# Patient Record
Sex: Male | Born: 1935 | Race: White | Hispanic: No | Marital: Married | Smoking: Former smoker
Health system: Southern US, Community
[De-identification: ages and names within clinical notes are randomized; demographics above are authoritative.]

## PROBLEM LIST (undated history)

## (undated) DIAGNOSIS — R29898 Other symptoms and signs involving the musculoskeletal system: Secondary | ICD-10-CM

## (undated) DIAGNOSIS — G4730 Sleep apnea, unspecified: Secondary | ICD-10-CM

## (undated) DIAGNOSIS — R011 Cardiac murmur, unspecified: Secondary | ICD-10-CM

## (undated) DIAGNOSIS — H269 Unspecified cataract: Secondary | ICD-10-CM

## (undated) DIAGNOSIS — K449 Diaphragmatic hernia without obstruction or gangrene: Secondary | ICD-10-CM

## (undated) DIAGNOSIS — I10 Essential (primary) hypertension: Secondary | ICD-10-CM

## (undated) DIAGNOSIS — K219 Gastro-esophageal reflux disease without esophagitis: Secondary | ICD-10-CM

## (undated) DIAGNOSIS — Z0389 Encounter for observation for other suspected diseases and conditions ruled out: Secondary | ICD-10-CM

## (undated) DIAGNOSIS — N400 Enlarged prostate without lower urinary tract symptoms: Secondary | ICD-10-CM

## (undated) DIAGNOSIS — E119 Type 2 diabetes mellitus without complications: Secondary | ICD-10-CM

## (undated) DIAGNOSIS — M1990 Unspecified osteoarthritis, unspecified site: Secondary | ICD-10-CM

## (undated) DIAGNOSIS — Z972 Presence of dental prosthetic device (complete) (partial): Secondary | ICD-10-CM

## (undated) DIAGNOSIS — Z9889 Other specified postprocedural states: Secondary | ICD-10-CM

## (undated) DIAGNOSIS — Z87442 Personal history of urinary calculi: Secondary | ICD-10-CM

## (undated) DIAGNOSIS — E7800 Pure hypercholesterolemia, unspecified: Secondary | ICD-10-CM

## (undated) DIAGNOSIS — Z9989 Dependence on other enabling machines and devices: Secondary | ICD-10-CM

## (undated) DIAGNOSIS — K5790 Diverticulosis of intestine, part unspecified, without perforation or abscess without bleeding: Secondary | ICD-10-CM

## (undated) DIAGNOSIS — I38 Endocarditis, valve unspecified: Secondary | ICD-10-CM

## (undated) DIAGNOSIS — I4891 Unspecified atrial fibrillation: Secondary | ICD-10-CM

## (undated) DIAGNOSIS — H9190 Unspecified hearing loss, unspecified ear: Secondary | ICD-10-CM

## (undated) DIAGNOSIS — G4733 Obstructive sleep apnea (adult) (pediatric): Secondary | ICD-10-CM

## (undated) DIAGNOSIS — I639 Cerebral infarction, unspecified: Secondary | ICD-10-CM

## (undated) HISTORY — PX: KNEE ARTHROSCOPY: SHX127

## (undated) HISTORY — DX: Cardiac murmur, unspecified: R01.1

## (undated) HISTORY — DX: Unspecified atrial fibrillation: I48.91

## (undated) HISTORY — PX: UPPER GI ENDOSCOPY: SHX6162

## (undated) HISTORY — DX: Essential (primary) hypertension: I10

## (undated) HISTORY — DX: Benign prostatic hyperplasia without lower urinary tract symptoms: N40.0

## (undated) HISTORY — DX: Other specified postprocedural states: Z98.890

## (undated) HISTORY — DX: Other symptoms and signs involving the musculoskeletal system: R29.898

## (undated) HISTORY — DX: Encounter for observation for other suspected diseases and conditions ruled out: Z03.89

## (undated) HISTORY — PX: COLONOSCOPY: SHX174

## (undated) HISTORY — DX: Sleep apnea, unspecified: G47.30

## (undated) HISTORY — PX: SHOULDER OPEN ROTATOR CUFF REPAIR: SHX2407

## (undated) HISTORY — PX: LAPAROSCOPIC CHOLECYSTECTOMY: SUR755

## (undated) HISTORY — PX: EYE SURGERY: SHX253

## (undated) HISTORY — DX: Diverticulosis of intestine, part unspecified, without perforation or abscess without bleeding: K57.90

## (undated) HISTORY — DX: Diaphragmatic hernia without obstruction or gangrene: K44.9

## (undated) HISTORY — PX: VASECTOMY: SHX75

## (undated) HISTORY — DX: Endocarditis, valve unspecified: I38

## (undated) HISTORY — PX: MULTIPLE TOOTH EXTRACTIONS: SHX2053

## (undated) HISTORY — DX: Cerebral infarction, unspecified: I63.9

## (undated) HISTORY — DX: Gastro-esophageal reflux disease without esophagitis: K21.9

## (undated) HISTORY — PX: MITRAL VALVE REPAIR: SHX2039

## (undated) HISTORY — PX: JOINT REPLACEMENT: SHX530

---

## 1998-02-25 ENCOUNTER — Emergency Department (HOSPITAL_COMMUNITY): Admission: EM | Admit: 1998-02-25 | Discharge: 1998-02-25 | Payer: Self-pay | Admitting: Emergency Medicine

## 1998-02-25 ENCOUNTER — Encounter: Payer: Self-pay | Admitting: Emergency Medicine

## 2001-08-16 ENCOUNTER — Encounter: Payer: Self-pay | Admitting: Cardiology

## 2001-08-16 ENCOUNTER — Ambulatory Visit (HOSPITAL_COMMUNITY): Admission: RE | Admit: 2001-08-16 | Discharge: 2001-08-16 | Payer: Self-pay | Admitting: Cardiology

## 2001-10-20 ENCOUNTER — Ambulatory Visit (HOSPITAL_COMMUNITY): Admission: RE | Admit: 2001-10-20 | Discharge: 2001-10-20 | Payer: Self-pay | Admitting: Cardiology

## 2001-10-20 ENCOUNTER — Encounter: Payer: Self-pay | Admitting: Cardiology

## 2001-10-25 ENCOUNTER — Ambulatory Visit (HOSPITAL_COMMUNITY): Admission: RE | Admit: 2001-10-25 | Discharge: 2001-10-25 | Payer: Self-pay | Admitting: Neurology

## 2001-10-25 ENCOUNTER — Encounter: Payer: Self-pay | Admitting: Neurology

## 2002-06-13 DIAGNOSIS — K449 Diaphragmatic hernia without obstruction or gangrene: Secondary | ICD-10-CM

## 2002-06-13 HISTORY — DX: Diaphragmatic hernia without obstruction or gangrene: K44.9

## 2004-03-18 ENCOUNTER — Ambulatory Visit: Payer: Self-pay | Admitting: Family Medicine

## 2004-04-06 ENCOUNTER — Ambulatory Visit: Payer: Self-pay | Admitting: Family Medicine

## 2004-07-21 ENCOUNTER — Ambulatory Visit: Payer: Self-pay | Admitting: Family Medicine

## 2004-08-12 ENCOUNTER — Emergency Department (HOSPITAL_COMMUNITY): Admission: EM | Admit: 2004-08-12 | Discharge: 2004-08-12 | Payer: Self-pay | Admitting: Emergency Medicine

## 2004-08-12 ENCOUNTER — Ambulatory Visit: Payer: Self-pay | Admitting: Family Medicine

## 2004-08-17 ENCOUNTER — Ambulatory Visit: Payer: Self-pay | Admitting: Family Medicine

## 2004-09-07 ENCOUNTER — Ambulatory Visit: Payer: Self-pay | Admitting: Family Medicine

## 2004-12-08 ENCOUNTER — Ambulatory Visit: Payer: Self-pay | Admitting: Family Medicine

## 2004-12-10 ENCOUNTER — Ambulatory Visit: Payer: Self-pay | Admitting: Family Medicine

## 2005-01-11 ENCOUNTER — Ambulatory Visit: Payer: Self-pay | Admitting: Family Medicine

## 2005-01-27 ENCOUNTER — Ambulatory Visit: Payer: Self-pay | Admitting: Internal Medicine

## 2005-02-16 ENCOUNTER — Ambulatory Visit: Payer: Self-pay | Admitting: Internal Medicine

## 2005-02-16 ENCOUNTER — Ambulatory Visit (HOSPITAL_COMMUNITY): Admission: RE | Admit: 2005-02-16 | Discharge: 2005-02-16 | Payer: Self-pay | Admitting: Internal Medicine

## 2005-04-07 ENCOUNTER — Ambulatory Visit: Payer: Self-pay | Admitting: Family Medicine

## 2005-04-29 ENCOUNTER — Ambulatory Visit: Payer: Self-pay | Admitting: Cardiology

## 2005-08-05 ENCOUNTER — Ambulatory Visit: Payer: Self-pay | Admitting: Family Medicine

## 2005-10-11 ENCOUNTER — Ambulatory Visit: Payer: Self-pay | Admitting: Family Medicine

## 2005-12-31 ENCOUNTER — Ambulatory Visit: Payer: Self-pay | Admitting: Family Medicine

## 2006-01-04 ENCOUNTER — Ambulatory Visit: Payer: Self-pay | Admitting: Family Medicine

## 2006-01-27 ENCOUNTER — Ambulatory Visit: Payer: Self-pay | Admitting: Family Medicine

## 2006-02-03 ENCOUNTER — Ambulatory Visit: Payer: Self-pay | Admitting: Family Medicine

## 2006-03-16 ENCOUNTER — Ambulatory Visit: Payer: Self-pay | Admitting: Internal Medicine

## 2006-05-03 ENCOUNTER — Encounter: Admission: RE | Admit: 2006-05-03 | Discharge: 2006-08-01 | Payer: Self-pay | Admitting: Orthopaedic Surgery

## 2006-06-28 ENCOUNTER — Ambulatory Visit: Payer: Self-pay | Admitting: Cardiology

## 2006-08-02 ENCOUNTER — Encounter: Admission: RE | Admit: 2006-08-02 | Discharge: 2006-09-06 | Payer: Self-pay | Admitting: Orthopaedic Surgery

## 2007-01-16 ENCOUNTER — Ambulatory Visit: Payer: Self-pay | Admitting: Cardiology

## 2007-01-18 ENCOUNTER — Encounter: Payer: Self-pay | Admitting: Pulmonary Disease

## 2007-01-19 ENCOUNTER — Encounter: Payer: Self-pay | Admitting: Pulmonary Disease

## 2007-01-25 ENCOUNTER — Encounter: Payer: Self-pay | Admitting: Pulmonary Disease

## 2007-01-26 ENCOUNTER — Ambulatory Visit: Payer: Self-pay | Admitting: Cardiology

## 2007-02-14 ENCOUNTER — Ambulatory Visit: Payer: Self-pay | Admitting: Cardiology

## 2007-02-14 ENCOUNTER — Inpatient Hospital Stay (HOSPITAL_BASED_OUTPATIENT_CLINIC_OR_DEPARTMENT_OTHER): Admission: RE | Admit: 2007-02-14 | Discharge: 2007-02-14 | Payer: Self-pay | Admitting: Cardiology

## 2007-02-14 HISTORY — PX: CARDIAC CATHETERIZATION: SHX172

## 2007-03-03 ENCOUNTER — Ambulatory Visit: Payer: Self-pay | Admitting: Cardiology

## 2007-04-06 ENCOUNTER — Ambulatory Visit: Payer: Self-pay | Admitting: Pulmonary Disease

## 2007-04-06 DIAGNOSIS — I10 Essential (primary) hypertension: Secondary | ICD-10-CM

## 2007-04-06 DIAGNOSIS — I38 Endocarditis, valve unspecified: Secondary | ICD-10-CM | POA: Insufficient documentation

## 2007-04-06 DIAGNOSIS — R0602 Shortness of breath: Secondary | ICD-10-CM | POA: Insufficient documentation

## 2007-04-06 DIAGNOSIS — I2510 Atherosclerotic heart disease of native coronary artery without angina pectoris: Secondary | ICD-10-CM | POA: Insufficient documentation

## 2007-04-06 DIAGNOSIS — I4891 Unspecified atrial fibrillation: Secondary | ICD-10-CM | POA: Insufficient documentation

## 2007-04-06 DIAGNOSIS — G4730 Sleep apnea, unspecified: Secondary | ICD-10-CM

## 2007-12-07 ENCOUNTER — Ambulatory Visit: Payer: Self-pay | Admitting: Cardiology

## 2008-01-25 ENCOUNTER — Encounter: Payer: Self-pay | Admitting: Cardiology

## 2008-02-21 ENCOUNTER — Encounter: Payer: Self-pay | Admitting: Cardiology

## 2008-04-09 ENCOUNTER — Ambulatory Visit: Payer: Self-pay | Admitting: Cardiology

## 2008-04-16 ENCOUNTER — Ambulatory Visit: Payer: Self-pay | Admitting: Cardiology

## 2008-04-29 ENCOUNTER — Encounter: Payer: Self-pay | Admitting: Cardiology

## 2008-05-06 ENCOUNTER — Encounter: Payer: Self-pay | Admitting: Cardiology

## 2008-05-07 ENCOUNTER — Ambulatory Visit (HOSPITAL_COMMUNITY): Admission: RE | Admit: 2008-05-07 | Discharge: 2008-05-07 | Payer: Self-pay | Admitting: Cardiology

## 2008-05-14 ENCOUNTER — Ambulatory Visit: Payer: Self-pay | Admitting: Cardiology

## 2009-01-10 ENCOUNTER — Encounter: Payer: Self-pay | Admitting: Cardiology

## 2009-01-31 ENCOUNTER — Encounter: Payer: Self-pay | Admitting: Cardiology

## 2009-04-29 ENCOUNTER — Ambulatory Visit: Payer: Self-pay | Admitting: Cardiology

## 2009-05-01 ENCOUNTER — Ambulatory Visit: Payer: Self-pay | Admitting: Cardiology

## 2009-05-06 ENCOUNTER — Encounter: Payer: Self-pay | Admitting: Cardiology

## 2009-05-07 ENCOUNTER — Encounter: Payer: Self-pay | Admitting: Cardiology

## 2009-05-20 ENCOUNTER — Encounter (INDEPENDENT_AMBULATORY_CARE_PROVIDER_SITE_OTHER): Payer: Self-pay | Admitting: *Deleted

## 2009-06-13 ENCOUNTER — Encounter (INDEPENDENT_AMBULATORY_CARE_PROVIDER_SITE_OTHER): Payer: Self-pay | Admitting: *Deleted

## 2009-12-22 ENCOUNTER — Encounter: Payer: Self-pay | Admitting: Cardiology

## 2010-03-19 NOTE — Letter (Signed)
Summary: Colonoscopy Date Change Letter  [facility]  [address] [place], [place] [address]   Phone: [phone]  Fax: [phone]      June 13, 2009 MRN: [id number]   [name] [address] [place], [place]  [address]   Dear [name],   Previously you were recommended to have a repeat colonoscopy around this time. Your chart was recently reviewed by Dr. Judie Petit T. Russella Dar of [facility]. Follow up colonoscopy is now recommended in May 2014. This revised recommendation is based on current, nationally recognized guidelines for colorectal cancer screening and polyp surveillance. These guidelines are endorsed by the American Cancer Society, [employer] on Colorectal Cancer as well as numerous other major medical organizations.  Please understand that our recommendation assumes that you do not have any new symptoms such as bleeding, a change in bowel habits, anemia, or significant abdominal discomfort. If you do have any concerning GI symptoms or want to discuss the guideline recommendations, please call to arrange an office visit at your earliest convenience. Otherwise we will keep you in our reminder system and contact you 1-2 months prior to the date listed above to schedule your next colonoscopy.  Thank you,  Judie Petit T. Russella Dar, M.D.  [facility] [phone]

## 2010-03-19 NOTE — Letter (Signed)
Summary: Engineer, materials at [facility]  [address]   [place], [place] [address]   Phone: [phone]  Fax: [phone]        May 20, 2009 MRN: [id number]   KORIE BRABSON [address] [place], [place]  [address]   Dear Mr. Simoni,  Your test ordered by Selena Batten has been reviewed by your physician (or physician assistant) and was found to be normal or stable. Your physician (or physician assistant) felt no changes were needed at this time.  __X__ Echocardiogram  (stable mitral valve repair)  ____ Cardiac Stress Test  ____ Lab Work  ____ Peripheral vascular study of arms, legs or neck  ____ CT scan or X-ray  ____ Lung or Breathing test  ____ Other:   Thank you.   Hoover Brunette, LPN    Duane Boston, M.D., F.A.C.C. Thressa Sheller, M.D., F.A.C.C. Oneal Grout, M.D., F.A.C.C. Cheree Ditto, M.D., F.A.C.C. Daiva Nakayama, M.D., F.A.C.C. Kenney Houseman, M.D., F.A.C.C. Jeanne Ivan, PA-C

## 2010-03-19 NOTE — Assessment & Plan Note (Signed)
Summary: 1 YR FU PER MARCH REMINDER-SRS      Allergies Added: NKDA  Referring Provider:  Degent Primary Provider:  [name]  CC:  follow-up visit.  History of Present Illness: The patient is a 75 year old male with a history of mitral valve repair with Cosgrove ring.patient do well from a cardiovascular perspective.  He denies any chest pain short of breath orthopnea or PND.  He does report loud snoring and some in the daytime.  We suspected obstructive sleep apnea this patient after an overnight pulse oximeter was significantly positive.  The patient however has never gone for a sleep study.  He denies any palpitations or syncope.  He has no orthopnea PND.  Preventive Screening-Counseling & Management  Alcohol-Tobacco     Smoking Status: quit     Year Quit: 1990  Current Problems (verified): 1)  Valvular Heart Disease  (ICD-424.90) 2)  Coronary Heart Disease  (ICD-414.00) 3)  Hx of Atrial Fibrillation  (ICD-427.31) 4)  Dyspnea  (ICD-786.05) 5)  Obstructive Sleep Apnea  (ICD-780.57) 6)  Hypertension  (ICD-401.9)  Current Medications (verified): 1)  Bayer Low Strength 81 Mg  Tbec (Aspirin) .... Once Daily 2)  Lipitor 10 Mg  Tabs (Atorvastatin Calcium) .... 1/2 Once Daily 3)  Lisinopril 10 Mg  Tabs (Lisinopril) .... Once Daily 4)  Omeprazole 20 Mg  Cpdr (Omeprazole) .... Once Daily 5)  Fish Oil 1000 Mg  Caps (Omega-3 Fatty Acids) .... Once Daily 6)  Multivitamins   Tabs (Multiple Vitamin) .... Once Daily  Allergies (verified): No Known Drug Allergies  Comments:  Nurse/Medical Assistant: The patient is currently on medications but does not know the name or dosage at this time. Instructed to contact our office with details. Will update medication list at that time.  Past History:  Past Medical History: Last updated: 12/07/2007  1. Deconditioning.   2. Status post mitral valve repair with Cosgrove ring.   3. Paroxysmal atrial fibrillation postoperatively.   4. Intolerance  to beta blockers.   5. Hypertension, poorly controlled.   6. Negative ischemia with recent false-positive Cardiolite study with       catheterization confirming nonobstructive coronary artery disease.   7. Dyslipidemia, on Lipitor.  8.chronic elevation RT hemi diaphragm  Past Surgical History: Last updated: 04/06/2007 Cholecystectomy MV repair  Social History: Last updated: 04/06/2007 Patient states former smoker.  quit in 1990.  Smoked 2-3 ppd previously  Occupation: Neurosurgeon  Family History: noncontributory  Review of Systems       The patient complains of sleep apnea.  The patient denies fatigue, malaise, fever, weight gain/loss, vision loss, decreased hearing, hoarseness, chest pain, palpitations, shortness of breath, prolonged cough, wheezing, coughing up blood, abdominal pain, blood in stool, nausea, vomiting, diarrhea, heartburn, incontinence, blood in urine, muscle weakness, joint pain, leg swelling, rash, skin lesions, headache, fainting, dizziness, depression, anxiety, enlarged lymph nodes, easy bruising or bleeding, and environmental allergies.    Vital Signs:  Patient profile:   75 year old male Height:      71 inches Weight:      247 pounds BMI:     34.57 Pulse rate:   57 / minute BP sitting:   127 / 82  (left arm) Cuff size:   large  Vitals Entered By: Carlye Grippe (April 29, 2009 10:29 AM)  Nutrition Counseling: Patient's BMI is greater than 25 and therefore counseled on weight management options. CC: follow-up visit   Physical Exam  Additional Exam:  General: Well-developed, well-nourished in  no distress head: Normocephalic and atraumatic eyes PERRLA/EOMI intact, conjunctiva and lids normal nose: No deformity or lesions mouth normal dentition, normal posterior pharynx neck: Supple, no JVD.  No masses, thyromegaly or abnormal cervical nodes lungs: Normal breath sounds bilaterally without wheezing.  Normal percussion heart: regular rate  and rhythm with normal S1 and S2, no S3 or S4.  PMI is normal.  No pathological murmurs abdomen: Normal bowel sounds, abdomen is soft and nontender without masses, organomegaly or hernias noted.  No hepatosplenomegaly musculoskeletal: Back normal, normal gait muscle strength and tone normal pulsus: Pulse is normal in all 4 extremities Extremities: No peripheral pitting edema neurologic: Alert and oriented x 3 skin: Intact without lesions or rashes cervical nodes: No significant adenopathy psychologic: Normal affect    Impression & Recommendations:  Problem # 1:  VALVULAR HEART DISEASE (ICD-424.90) the patient status-post mitral valve repair.  A follow-up echocardiogram has been scheduled. Orders: 2-D Echocardiogram (2D Echo)  Problem # 2:  Hx of ATRIAL FIBRILLATION (ICD-427.31) no evidence of recurrence of arrhythmias. His updated medication list for this problem includes:    Bayer Low Strength 81 Mg Tbec (Aspirin) ..... Once daily  Problem # 3:  OBSTRUCTIVE SLEEP APNEA (ICD-780.57) the patient likely obstructive sleep apnea.  He has been referred to neurology for a sleep study.  Problem # 4:  HYPERTENSION (ICD-401.9) Assessment: Comment Only  His updated medication list for this problem includes:    Bayer Low Strength 81 Mg Tbec (Aspirin) ..... Once daily    Lisinopril 10 Mg Tabs (Lisinopril) ..... Once daily  Other Orders: EKG w/ Interpretation (93000)  Patient Instructions: 1)  2D Echo  2)  Dr. Ninetta Lights:                 3)  Please call above for appointment for OSA 4)  Follow up in  1 year.

## 2010-03-19 NOTE — Letter (Signed)
Summary: Discharge [facility]  Discharge [facility]   Imported By: Dorise Hiss 05/27/2009 11:57:15  _____________________________________________________________________  External Attachment:    Type:   Image     Comment:   External Document

## 2010-04-30 ENCOUNTER — Encounter: Payer: Self-pay | Admitting: Cardiology

## 2010-04-30 ENCOUNTER — Ambulatory Visit (INDEPENDENT_AMBULATORY_CARE_PROVIDER_SITE_OTHER): Payer: MEDICARE | Admitting: Cardiology

## 2010-04-30 DIAGNOSIS — I059 Rheumatic mitral valve disease, unspecified: Secondary | ICD-10-CM

## 2010-05-05 NOTE — Cardiovascular Report (Signed)
Summary: Cardiac Cath   Cardiac Cath   Imported By: Zachary George 04/30/2010 10:10:49  _____________________________________________________________________  External Attachment:    Type:   Image     Comment:   External Document

## 2010-05-05 NOTE — Letter (Signed)
Summary: [facility] D[facility]C DR. Orvan Falconer  [facility]C DR. Orvan Falconer   Imported By: Zachary George 04/30/2010 10:07:00  _____________________________________________________________________  External Attachment:    Type:   Image     Comment:   External Document

## 2010-05-05 NOTE — Assessment & Plan Note (Signed)
Summary: 1 YR FUL FH      Allergies Added: NKDA  Visit Type:  Follow-up Referring Provider:  Degent Primary Provider:  [name]   History of Present Illness: Echocardiogram was done in 2011.  The ejection fraction is 60 to 65%.  There was mild mitral stenosis and mild regurgitation.  The mean gradient across the mitral valve a 6 mm of mercury.  Mitral valve area was calculated at 1.77 cm.  There was mild bulbar hypertension. The patient is status post mitral valve surgery with the ring repair.  He has been doing well.  He presents for follow.  He was last seen in March of 2011.  The patient has a history approximately fibrillation postoperatively but has remained in normal sinus rhythm.  Nonobstructive coronary artery disease by cardiac catheterization.  He has a chronically elevated right hemidiaphragm. The patient is doing well.  He reports no chest pain shortness of breath orthopnea or PND.  He has no palpitations or syncope.  He stable from a cardiovascular perspective.   Preventive Screening-Counseling & Management  Alcohol-Tobacco     Smoking Status: quit     Year Quit: 1990  Current Medications (verified): 1)  Bayer Low Strength 81 Mg  Tbec (Aspirin) .... Once Daily 2)  Lisinopril 10 Mg  Tabs (Lisinopril) .... Once Daily 3)  Omeprazole 20 Mg  Cpdr (Omeprazole) .... Once Daily 4)  Fish Oil 1000 Mg  Caps (Omega-3 Fatty Acids) .... Once Daily 5)  Multivitamins   Tabs (Multiple Vitamin) .... Once Daily  Allergies (verified): No Known Drug Allergies  Comments:  Nurse/Medical Assistant: The patient's medications and allergies were verbally reviewed with the patient and were updated in the Medication and Allergy Lists.  Past History:  Past Medical History: Last updated: 12/07/2007  1. Deconditioning.   2. Status post mitral valve repair with Cosgrove ring.   3. Paroxysmal atrial fibrillation postoperatively.   4. Intolerance to beta blockers.   5. Hypertension, poorly  controlled.   6. Negative ischemia with recent false-positive Cardiolite study with       catheterization confirming nonobstructive coronary artery disease.   7. Dyslipidemia, on Lipitor.  8.chronic elevation RT hemi diaphragm  Past Surgical History: Last updated: 04/06/2007 Cholecystectomy MV repair  Family History: Last updated: 04/29/2009 noncontributory  Social History: Last updated: 04/06/2007 Patient states former smoker.  quit in 1990.  Smoked 2-3 ppd previously  Occupation: Neurosurgeon  Risk Factors: Smoking Status: quit (04/30/2010)  Review of Systems       The patient complains of weight gain/loss.  The patient denies fatigue, malaise, fever, vision loss, decreased hearing, hoarseness, chest pain, palpitations, shortness of breath, prolonged cough, wheezing, sleep apnea, coughing up blood, abdominal pain, blood in stool, nausea, vomiting, diarrhea, heartburn, incontinence, blood in urine, muscle weakness, joint pain, leg swelling, rash, skin lesions, headache, fainting, dizziness, depression, anxiety, enlarged lymph nodes, easy bruising or bleeding, and environmental allergies.    Vital Signs:  Patient profile:   75 year old male Height:      71 inches Weight:      247 pounds BMI:     34.57 Pulse rate:   53 / minute BP sitting:   146 / 83  (left arm) Cuff size:   large  Vitals Entered By: Carlye Grippe (April 30, 2010 1:50 PM)  Nutrition Counseling: Patient's BMI is greater than 25 and therefore counseled on weight management options.  Physical Exam  Additional Exam:  General: Well-developed, well-nourished in no distress  head: Normocephalic and atraumatic eyes PERRLA/EOMI intact, conjunctiva and lids normal nose: No deformity or lesions mouth normal dentition, normal posterior pharynx neck: Supple, no JVD.  No masses, thyromegaly or abnormal cervical nodes lungs: Normal breath sounds bilaterally without wheezing.  Normal percussion heart:  regular rate and rhythm with normal S1 and S2, no S3 or S4.  PMI is normal.  No pathological murmurs abdomen: Normal bowel sounds, abdomen is soft and nontender without masses, organomegaly or hernias noted.  No hepatosplenomegaly musculoskeletal: Back normal, normal gait muscle strength and tone normal pulsus: Pulse is normal in all 4 extremities Extremities: No peripheral pitting edema neurologic: Alert and oriented x 3 skin: Intact without lesions or rashes cervical nodes: No significant adenopathy psychologic: Normal affect    EKG  Procedure date:  04/30/2010  Findings:      sinus bradycardia otherwise normal tracing.  Impression & Recommendations:  Problem # 1:  VALVULAR HEART DISEASE (ICD-424.90) mitral valve repair status post Cosgrove ring on echocardiogram a year ago stable.  Problem # 2:  CORONARY HEART DISEASE (ICD-414.00) nonobstructive coronary artery disease no recurrent chest pain doing well. His updated medication list for this problem includes:    Bayer Low Strength 81 Mg Tbec (Aspirin) ..... Once daily    Lisinopril 10 Mg Tabs (Lisinopril) ..... Once daily  Orders: EKG w/ Interpretation (93000)  Patient Instructions: 1)  Your physician recommends that you continue on your current medications as directed. Please refer to the Current Medication list given to you today. 2)  Follow up in  6 months

## 2010-05-05 NOTE — Medication Information (Signed)
Summary: [facility] D/C MEDICATION SHEET ORDER  [facility] D/[facility] MEDICATION SHEET ORDER   Imported By: Zachary George 04/30/2010 10:03:16  _____________________________________________________________________  External Attachment:    Type:   Image     Comment:   External Document

## 2010-06-24 ENCOUNTER — Encounter: Payer: Self-pay | Admitting: Cardiology

## 2010-06-30 NOTE — Assessment & Plan Note (Signed)
[facility]                          [facility] OFFICE NOTE   NAME:Gulla, JOSTEN WARMUTH                     MRN:          [id number]  DATE:04/09/2008                            DOB:          October 20, 1935    HISTORY OF PRESENT ILLNESS:  The patient is a 75 year old male with a  history of mitral valve repair with Cosgrove ring.  The patient has been  referred by Dr. Lysbeth Galas due to an episode of dizziness versus vertigo.  The patient reports several weeks ago while sitting in the chair  suddenly felt that he might be passing out.  However, when I questioned  the patient about this, he states that it was more of a sensation of  room spinning around him.  It lasts 1-2 minutes.  His wife was very  concerned as his sister has had a stroke recently.  She was uncertain if  he was having a stroke.  She called EMS, but the patient was never  brought to the hospital.  His heart rhythm was stable and in the 40s  with stable vital signs.  He had no neurological deficits.   MEDICATIONS:  1. Omeprazole 20 mg q.a.m.  2. Lisinopril 10 mg p.o. q.a.m.  3. Enteric-coated aspirin 81 mg p.o. q.a.m.  4. Multivitamin q.a.m.  5. Fish oil 1000 mg p.o. q.a.m.  6. Lipitor 10 mg p.o. q.a.m.   PHYSICAL EXAMINATION:  VITAL SIGNS:  Blood pressure 126/75, heart rate  50, weight is 244 pounds.  NECK:  Normal carotid upstrokes and no carotid bruits.  The patient's  carotid Dopplers were done which were within normal limits.  LUNGS:  Clear breath sounds bilaterally.  HEART:  Regular rate and rhythm with normal S1 and S2.  No murmur, rubs,  or gallops.  ABDOMEN:  Soft, nontender.  No rebound or guarding.  Good bowel sounds.  EXTREMITIES:  No cyanosis, clubbing, or edema.   PROBLEM LIST:  1. Dizziness versus vertigo.  2. Rule out embolic event.  3. No significant carotid artery disease.  4. History of paroxysmal atrial fibrillation.  Rule out recurrent      paroxysmal atrial  fibrillation.  5. Rule out ischemia/transient ischemic attack.  6. Rule out chronotropic insufficiency.  7. Rule out carotid hypersensitivity.  8. Nonobstructive coronary artery disease with false positive      Cardiolite study.  9. Hypertension, controlled.  10.Bradycardia.  11.Dyslipidemia.  12.Chronic elevation of right hemidiaphragm.  13.Obstructive sleep apnea.   PLAN:  1. I tested the patient for carotid hypersensitivity in the office.  I      massaged his both left and right carotids, but there was no      decrease in heart rate and developed no symptoms.  2. I will evaluate the patient for chronotropic insufficiency.  He      does have a consistently low baseline heart rate.  We will see if      we were able to elevate his heart rate during exertion.  After      this, he will wear a CardioNet monitor to see if there is  any      pacemaker criteria.  Also to rule out if he has any paroxysmal      atrial fibrillation and possible embolic events.  3. I wrote MRI, MRA of the brain to make sure that the patient did not      have prior stroke.  If so then we will need a further evaluation of      possible cardiogenic emboli.     Learta Codding, MD,FACC     GED/MedQ  DD: 04/09/2008  DT: 04/10/2008  Job #: [id number]   cc:   Delaney Meigs, M.D.

## 2010-06-30 NOTE — Assessment & Plan Note (Signed)
[facility]                          [facility] OFFICE NOTE   NAME:[name], [name]                     MRN:          [id number]  DATE:03/03/2007                            DOB:          04-Mar-1935    REFERRING PHYSICIAN:  Dr. Lysbeth Galas.   HISTORY OF PRESENT ILLNESS:  The patient is a 75 year old male with a  history of mitral valve repair with a Cosgrove ring.  The patient in  December complained of some increased weight gain as well as new-onset  dyspnea.  I felt at that time the patient likely was dyspneic secondary  to weight gain and deconditioning.  The patient had a followup  echocardiographic study done and in particular, a Cardiolite scan.  This  was interpreted by Dr. Myrtis Ser and he felt that there was moderate ischemia  in the lateral wall; the patient was referred for diagnostic cardiac  catheterization.  He was found to have nonobstructive coronary artery  disease with a normal ejection fraction of 65% and no significant mitral  regurgitation.  The patient states that he has lost significant weight  in the meanwhile; he has lost 9 pounds since his last office visit and  he is feeling much better. He is less short of breath, confirming the  likelihood that weight gain caused the patient to be more dyspneic.   MEDICATIONS:  1. Monopril 20 mg p.o. daily.  2. Lisinopril 10 mg p.o. daily.  3. Enteric-coated aspirin 81 mg p.o. daily.  4. Multivitamin.  5. Fish oil 1000 mg p.o. daily.  6. Lipitor 10 mg a half a tablet p.o. daily.   PHYSICAL EXAMINATION:  VITAL SIGNS:  Blood pressure 152/80, heart rate  76 beats per minute.  Weight is 241 pounds.  NECK:  Normal carotid upstroke and no carotid bruits.  LUNGS:  Clear breath sounds bilaterally.  HEART:  Regular rate and rhythm with normal S1 and S2, no murmurs, rubs,  or gallops.  ABDOMEN:  Soft and nontender with no rebound or guarding.  Good bowel  sounds.  EXTREMITIES:  No cyanosis,  clubbing or edema.  NEUROLOGIC:  The patient is alert, oriented and grossly nonfocal.   PROBLEM LIST:  1. Deconditioning.  2. Status post mitral valve repair with Cosgrove ring.  3. Paroxysmal atrial fibrillation postoperatively.  4. Intolerance to beta blockers.  5. Hypertension, poorly controlled.  6. Negative ischemia with recent false-positive Cardiolite study with      catheterization confirming nonobstructive coronary artery disease.  7. Dyslipidemia, on Lipitor.   ASSESSMENT:  1. The patient's dyspnea is likely secondary to deconditioning and      weight gain.  He has now improved his diet and has lost a      significant amount of weight.  2. Also, the patient has an ApneaLink monitor done which confirmed the      high likelihood of obstructive sleep apnea.  The apnea index (AHI)      was at 32, well outside the normal range.  I have asked the patient      to consider a formal sleep study.  He  is somewhat reluctant and he      states that he will call me back to make a final decision.  3. The patient's blood pressure is uncontrolled and I have added      hydrochlorothiazide to his regimen with lisinopril.  The patient      now will take lisinopril 10/25 mg p.o. daily.     Learta Codding, MD,FACC  Electronically Signed    GED/MedQ  DD: 03/05/2007  DT: 03/05/2007  Job #: [id number]   cc:   Delaney Meigs, M.D.

## 2010-06-30 NOTE — Assessment & Plan Note (Signed)
[facility]                          [facility] OFFICE NOTE   NAME:[name], [name]                     MRN:          [id number]  DATE:06/28/2006                            DOB:          1935/02/18    REFERRING PHYSICIAN:  Delaney Meigs, M.D.   HISTORY OF PRESENT ILLNESS:  The patient is a 75 year old male with a  history of severe mitral regurgitation, status post mitral valve repair  in September __________ , by Dr. Janey Genta.  The patient underwent a  recent shoulder surgery.  From the cardiovascular perspective, he has  been doing well.  He reports no chest pain, shortness of breath,  orthopnea or PND.  An echocardiogram done one year ago demonstrated a  stable valve repair.   MEDICATIONS:  1. Lipitor 10 mg p.o. q.d.  2. Omeprazole 10 mg p.o. q.d.  3. Lisinopril 10 mg p.o. q.d.  4. Enteric-coated aspirin 81 mg p.o. q.d.   PHYSICAL EXAMINATION:  VITAL SIGNS:  Blood pressure 133/80, heart rate  56, weight 240 pounds.  NECK:  Normal carotid upstroke.  No carotid bruits.  LUNGS:  Clear breath sounds bilaterally.  HEART:  Regular rate and rhythm.  Normal S1 and S2.  No murmurs, rubs or  gallops.  ABDOMEN:  Soft.  EXTREMITIES:  No clubbing, cyanosis or edema.  NEUROLOGIC:  The patient is alert and oriented.  Grossly nonfocal.   PROBLEMS:  1. Status post mitral valve repair with a Cosgrove ring.  2. Paroxysmal atrial fibrillation postoperatively.  3. Intolerance to BETA BLOCKERS.  4. Hypertension, stable.  5. Dyslipidemia, stable, on low-dose Lipitor.   PLAN:  1. The patient's valve repair appears to be durable.  He has the      symptoms of dyspnea.  2. I have talked with the patient about primary prevention and      cholesterol-lowering medications.  I have given his wife a      reference of one of my previous mentors, Dr. Gwen Her.  3. Additional followup in one year.     Learta Codding, MD,FACC  Electronically  Signed    GED/MedQ  DD: 06/28/2006  DT: 06/28/2006  Job #: [id number]   cc:   Delaney Meigs, M.D.

## 2010-06-30 NOTE — Assessment & Plan Note (Signed)
[facility]                          [facility]   NAME:[name], [name] SCHNITKER                     MRN:          [id number]  DATE:01/16/2007                            DOB:          Jun 04, 1935    REFERRING PHYSICIAN:  Delaney Meigs, M.D.   HISTORY OF PRESENT ILLNESS:  The patient is a 75 year old male with a  history of severe mitral regurgitation status post mitral valve repair  several years ago by Dr. __________ .  The patient underwent a mitral  valve repair with Cosgrove ring.  He has been doing quite well up until  a few months ago when he started complaining of some shortness of  breath.  The patient did gain quite a bit of weight since his last  office visit and on my records, has at least 10 pound weight gain.  He  states he has been noncompliant with a diet.  He also has noticed  decrease in exercise tolerance.  When he walks up a hill 200 feet, he  feels like he is huffing no acute distress puffing and feels tight in  the chest at the end of his walk.  He also has reported occasional  palpitations.  They occur both at rest and exertion.  At night, he  sometimes experiences choking sensation but denies any orthopnea or PND.  His wife is concerned that he could have sleep apnea given the fact that  he snores loud at night and sometimes stops breathing.  The patient also  reports easy fatigability during the daytime and falls asleep very  easily during the day time.  Of note also is that the patient did not  get any endocarditis prophylaxis during a recent root canal.  He denies  having any fever or chills.  I went in extensive detail over the new  guideline and recommendations and although patient's typical mitral  valve repair do not need endocarditis prophylaxis, he does have a  Cosgrove ring with prosthetic material which put him in the high risk  group, therefore the patient will need to adhere to endocarditis   prophylaxis.   MEDICATIONS:  1. Omeprazole 20 mg a day.  2. Lisinopril 10  mg p.o. daily.  3. Enteric coated aspirin 81 mg a day.  4. Multivitamin.  5. Fish oil.  6. Lipitor 10 mg half tablet p.o. daily.   PHYSICAL EXAMINATION:  VITAL SIGNS:  Blood pressure 141/83, heart rate  55 beats per minute, weight 250 pounds.  NECK:  Normal carotid upstroke.  No carotid bruits.  LUNGS:  Clear breath sounds bilaterally.  CARDIOVASCULAR:  Regular rate and rhythm, normal S1 and S2.  I do not  hear a pathological murmur.  ABDOMEN:  Soft, nontender, no rebound or guarding, good bowel sounds.  EXTREMITIES:  No clubbing, cyanosis, or edema.  NEUROLOGIC:  Patient is alert and oriented and grossly nonfocal.   PROBLEMS:  1. New onset dyspnea.      a.     Rule out deconditioning and weight gain.      b.     Rule out mitral  regurgitation.      c.     Rule out coronary artery disease.  2. History of mitral valve repair with Cosgrove ring.  3. Endocarditis prophylaxis.  4. Paroxysmal atrial fibrillation postoperatively only.  5. Hypertension, stable.  6. Dyslipidemia, on Lipitor.   PLAN:  1. The patient's dyspnea is almost certainly due to deconditioning and      weight gain.  I do not think that the patient has developed      significant coronary artery disease.  In the physical examination      there is also no evidence that he has a complication of his mitral      valve repair.  2. It has been some time since the patient has been evaluated with      echocardiographic study and also had no recent stress test.  We      will proceed with both studies to rule out valvular complications      and coronary artery disease.  3. I suspect the patient has high likelihood of being diagnosed with      sleep apnea.  We will seen him an apnea link and he may need a      formal sleep study following this.  4. The patient does report palpitations but they are rare and brief      and we will first proceed with  echo and stress testing as well as      the apnea link and we will then decide whether the patient will      need an event monitor.  5. Patient will follow up with [place] in the next couple of months.  I      have also obtained a chest x-ray to make sure that there is no      pulmonary pathology contributing to his dyspnea.     Learta Codding, MD,FACC  Electronically Signed    GED/MedQ  DD: 01/16/2007  DT: 01/16/2007  Job #: [id number]

## 2010-06-30 NOTE — Assessment & Plan Note (Signed)
[facility]                          [facility]   NAME:Wellons, [name]                     MRN:          [id number]  DATE:12/07/2007                            DOB:          [birth year]    REFERRING PHYSICIAN:  Delaney Meigs, MD   HISTORY OF PRESENT ILLNESS:  The patient is a pleasant 75 year old male  with a history of mitral valve repair with a Cosgrove ring.  He has been  doing well.  He reports no chest pain.  He does have chronic dyspnea on  moderate exertion secondary to deconditioning.  He has also been  diagnosed with obstructive sleep apnea but he has declined to wear a  face mask at night.  The plans were discussed, however, with Dr. Cyril Mourning.  Next, from a cardiac standpoint, he is doing well and denies any  chest pain.  He has no orthopnea or PND.  He has no palpitations or  syncope.  His EKG shows sinus bradycardia with no acute ischemic  changes.   MEDICATIONS:  1. Omeprazole 20 mg in the a.m.  2. Lisinopril 10 mg in the a.m.  3. Enteric-coated aspirin 81 mg in the a.m.  4. Multivitamin.  5. Fish oil.  6. Lipitor 10 half a tablet q.a.m.   PHYSICAL EXAMINATION:  VITAL SIGNS:  Blood pressure 133/73, heart rate  48, weights 239 pounds.  NECK:  Normal carotid stroke.  No carotid bruits.  LUNGS:  Clear breath sounds bilaterally.  HEART:  Regular rate and rhythm with normal S1 and S2.  No pathological  murmurs.  ABDOMEN:  Soft, nontender.  No rebound or guarding.  Good bowel sounds.  EXTREMITIES:  No cyanosis, clubbing, or edema.  NEURO:  The patient is alert and oriented grossly nonfocal.   PROBLEM LIST:  1. Status post mitral valve repair with Cosgrove ring.  2. History of paroxysmal atrial fibrillation postoperatively.  3. Intolerance to beta-blocker secondary to baseline sinus      bradycardia.  4. Hypertension, controlled.  5. Nonobstructive coronary artery disease with false positive      Cardiolite  study.  6. Dyslipidemia, on Lipitor.  7. Chronic elevation of right hemidiaphragm.  8. Obstructive sleep apnea.   PLAN:  1. From a cardiovascular standpoint, the patient is doing quite well.      He does not need any adjustments in his medication.  2. I have encouraged him to talk with Dr. Cyril Mourning about applying      CPAP for his sleep apnea.  3. The patient is planning to travel to [place] and asks me about H1N1      vaccine.  I told him that this decision that he and his wife will      have to make.     Learta Codding, MD,FACC  Electronically Signed    GED/MedQ  DD: 12/07/2007  DT: 12/08/2007  Job #: [id number]   cc:   Delaney Meigs, M.D.

## 2010-06-30 NOTE — Assessment & Plan Note (Signed)
[facility]                          [facility] NOTE   NAME:[name], [name]                     MRN:          [id number]  DATE:05/14/2008                            DOB:          04-Aug-1935    REFERRING PHYSICIAN:  Delaney Meigs, M.D.   HISTORY OF PRESENT ILLNESS:  The patient is a 75 year old male with a  history of mitral valve repair with Cosgrove ring.  The patient recently  reports an episode of dizziness versus vertigo.  The patient's wife is  very concerned regarding possible embolic event/stroke.  We referred the  patient on MRI, MRA of the brain which was within normal limits.  He  also underwent stress testing to rule out chronotropic insufficiency and  this also was within normal limits.  The patient states that he is  feeling fine.  He has had no recurrent episodes of dizziness.  His heart  rate is currently stable at 49 beats per minute.  The patient does have  typically a low heart rate.  He also is questioning whether he really  needs Lipitor based on the lipid panel, and I have told the patient that  there is no indication for a statin in light of his normal  catheterization previously in 2008.   MEDICATIONS:  1. Omeprazole 20 mg p.o. q.a.m.  2. Lisinopril 10 mg p.o. q.a.m.  3. Enteric-coated aspirin 81 mg p.o. q.a.m.  4. Multivitamin.  5. Fish oil.  6. Lipitor 10 mg half a tablet p.o. q.a.m.   PHYSICAL EXAMINATION:  VITAL SIGNS:  Blood pressure 139/78, heart rate  is 49, weight is 243 pounds.  NECK:  Normal carotid upstroke and no carotid bruits.  LUNGS:  Clear breath sounds bilaterally.  HEART:  Regular rate and rhythm.  Normal S1 and S2.  No murmur, rubs, or  gallops.  ABDOMEN:  Soft and nontender.  No rebound or guarding.  Good bowel  sounds.  EXTREMITY:  No cyanosis, clubbing, or edema.   PROBLEM LIST:  1. Dizziness/vertigo, resolved.  2. Status post mitral valve with Cosgrove ring.  3. History of  paroxysmal atrial fibrillation.  No recurrence.  4. Negative workup for chronotropic insufficiency.  5. Nonobstructive coronary artery disease with false positive      Cardiolite study.  6. Hypertension controlled.   PLAN:  1. At this point, the patient can come off Lipitor as there is no      clear indication for statin drug therapy for primary prevention in      this man with low LDL and low total cholesterol  2. The patient's mitral valve repair is durable.  3. He has had no further episodes of dizziness or vertigo and although      he has chronic bradycardia, there is no indication for pacemaker      implantation and no indication for cardiac monitor at the present      time, given the fact that he is asymptomatic.     Learta Codding, MD,FACC  Electronically Signed    GED/MedQ  DD: 05/14/2008  DT: 05/15/2008  Job #:  [id number]   cc:   Delaney Meigs, M.D.

## 2010-06-30 NOTE — Cardiovascular Report (Signed)
NAME:  [name], [name] NO.:  [id number]   MEDICAL RECORD NO.:  [id number]          PATIENT TYPE:  OIB   LOCATION:  1962                         FACILITY:  [facility]   PHYSICIAN:  Rollene Rotunda, MD, FACCDATE OF BIRTH:  September 12, 1935   DATE OF PROCEDURE:  02/14/2007  DATE OF DISCHARGE:                            CARDIAC CATHETERIZATION   PRIMARY CARE PHYSICIAN:  Dr. Joette Catching.   PROCEDURE:  Left heart catheterization, coronary arteriography.   INDICATIONS:  Evaluate patient with chest discomfort and a Cardiolite  suggesting lateral ischemia.   PROCEDURE NOTE:  Left heart catheterization was performed via the right  femoral artery.  The artery was cannulated using the modified seldinger  technique.  A #4-French arterial sheath was inserted via the modified  Seldinger technique.  Preformed Judkins and a pigtail catheter were  utilized.  The patient tolerated procedure well and left the lab in  stable condition.   RESULTS:  Hemodynamics:  LV 122/5, AO 116/79.  Coronaries:  Left main was normal.  The LAD was normal.  First diagonal  was large and branching.  The circumflex was large and dominant.  There  was an OM-1, which was branching.  It was moderate size and normal.  There were two posterolaterals, which were small and normal.  The PDA  was small and normal.  The right coronary artery was nondominant.  There  was an anterior high takeoff.  This was reached with an Amplatz one  catheter.  There were mild luminal irregularities.  Left ventriculogram:  The left ventriculogram was obtained in the RAO  projection.  The EF 65% with normal wall motion.   CONCLUSION:  Nonobstructive coronary artery disease.  Well-preserved  ejection fraction.   PLAN:  The patient will continue to have medical management and  evaluation of nonanginal chest pain.      Rollene Rotunda, MD, [facility]  Electronically Signed     JH/MEDQ  D:  02/14/2007  T:  02/14/2007  Job:  [id number]   cc:   Delaney Meigs, M.D.

## 2010-07-03 NOTE — H&P (Signed)
NAME:  SHUNSUKE, GRANZOW              ACCOUNT NO.:  [id number]   MEDICAL RECORD NO.:  [id number]          PATIENT TYPE:  AMB   LOCATION:                                FACILITY:  [facility]   PHYSICIAN:  R. Roetta Sessions, M.D. DATE OF BIRTH:  15-Mar-1935   DATE OF ADMISSION:  DATE OF DISCHARGE:  LH                                HISTORY & PHYSICAL   REASON FOR CONSULTATION:  Esophageal dysphagia.   Mr. Hengel is a pleasant 75 year old Caucasian male who I saw back in 1994  for esophageal dysphagia. He underwent an EGD at which time a 54-French  Maloney dilator was passed. He did not have a definite ring, stricture or  web. He did have erosive reflux esophagitis. This was associated with marked  improvement in his dysphagia symptoms. He had some antral gastritis. CLOtest  was negative. Since that time, three or four years ago, he was seen by  [facility] down in [place] and underwent a screening colonoscopy.  Polyps were taken off. He is not sure when he is to return for colonoscopy.  He remembers having an EGD at that time but does not remember whether or not  he had an esophageal dilation. He has done very well until recently. The  past couple of months, he has had esophageal dysphagia symptoms to solids  and what sounds like transient food impactions on multiple occasions.   He has not had any odynophagia. No hematemesis, melena or rectal bleeding.  No change in weight. No early satiety, etc. Since I last saw him, he  underwent the mitral valve repair with by a da Vinci procedure by Dr.  Janey Genta down in [place].   PAST MEDICAL HISTORY:  1.  Significant for hypertension.  2.  Mitral valve disease status post repair.  3.  He has also had his gallbladder out.  4.  Knee surgery.   CURRENT MEDICATIONS:  1.  Multivitamin daily.  2.  Aspirin 81 mg daily.  3.  Omeprazole 20 mg orally daily.  4.  Lisinopril 10 mg daily.  5.  Lipitor 10 mg daily.   ALLERGIES:  No known drug  allergies.   FAMILY HISTORY:  Mother is deceased, cause unknown. Father died with  congestive heart failure. No history of chronic GI or liver illness.   SOCIAL HISTORY:  The patient is married and has two sons. He is retired from  [employer]. He stopped smoking 10 years ago, rarely consumes alcohol.   REVIEW OF SYSTEMS:  As in history of present illness. He does not have any  reflux symptoms as long as he takes omeprazole. He has not had any chest  pain or dyspnea on exertion.   PHYSICAL EXAMINATION:  GENERAL:  Reveals a pleasant 75 year old gentleman  resting comfortably, accompanied by his wife.  VITAL SIGNS:  Weight 242.5, height 6 foot, temperature 97.9, BP 136/70,  pulse 56.  SKIN:  Warm and dry.  HEENT:  No scleral icterus. Conjunctivae were pink. Oral cavity with no  lesions.  NECK:  JVD is not prominent.  CHEST:  Lungs are clear to auscultation.  CARDIAC:  Regular rate and rhythm without murmurs, gallops, or rubs.  ABDOMEN:  Nondistended, positive bowel sounds, soft, nontender without  appreciable mass or organomegaly.  EXTREMITIES:  No edema.   IMPRESSION:  Mr. Zubair Lofton is a pleasant 75 year old gentleman with  recurrent esophageal dysphagia to solids. Almost certainly has an occult  ring or web or possibly a peptic stricture to account for his symptoms. I  certainly doubt he has more ominous process ongoing. His GERD symptoms are  well controlled.   RECOMMENDATIONS:  EGD with probable esophageal dilation in the near future.  Potential risks, benefits, and alternatives have been reviewed and questions  answered. He will need SB prophylaxis given his history of mitral valve  repair. We will plan to perform this in the next three to four weeks at his  convenience.   I would like to thank Dr. Delaney Meigs for allowing me to see this nice  gentleman once again.   ADDENDUM:  Apparently, he had polyps in the past. I told him he should check  with [facility]  or Dr. Lysbeth Galas as to when he is due for a surveillance  colonoscopy.      Jonathon Bellows, M.D.  Electronically Signed     RMR/MEDQ  D:  01/27/2005  T:  01/27/2005  Job:  [id number]   cc:   Delaney Meigs, M.D.  Fax: [phone]

## 2010-07-03 NOTE — Op Note (Signed)
NAME:  [name], [name]              ACCOUNT NO.:  [id number]   MEDICAL RECORD NO.:  [id number]          PATIENT TYPE:  AMB   LOCATION:  DAY                           FACILITY:  [facility]   PHYSICIAN:  R. Roetta Sessions, M.D. DATE OF BIRTH:  25-Feb-1935   DATE OF PROCEDURE:  02/16/2005  DATE OF DISCHARGE:                                 OPERATIVE REPORT   INDICATIONS FOR PROCEDURE:  This is a 75 year old gentleman with a history  of recurrent esophageal dysphagia.  He underwent an EGD back in 1994 for  esophageal dysphagia although he was not found to have an obstructing  lesion.  A 54-French Maloney dilator was passed.  This was associated with  long-term improvement of dysphagia symptoms until recently.  Reflux symptoms  were well controlled on omeprazole.  EGD is now being done.  This approached  as been discussed with the patient at length, the potential risks, benefits,  and alternatives having been reviewed.  Questions answered.  He is  agreeable.  Please see documentation in medical record.   PROCEDURE NOTE:  O2 saturation, blood pressure was monitored throughout the  entire procedure.   CONSCIOUS SEDATION:  Versed 4 mg IV, Demerol 75 mg IV in divided doses.  Cetacaine spray for topical anesthesia.  ASB prophylaxis ampicillin 2 g IV,  gentamicin 165 mg IV.   INSTRUMENT:  Olympus video chip system.   FINDINGS:  Examination through the esophagus revealed a noncritical-  appearing Schatzki's ring, otherwise esophageal mucosa appeared normal.  EG  junction easily traversed.  Stomach: Gastric cavity was emptied and  insufflated with air.  A thorough examination of gastric mucosa by  retroflexion view of the proximal stomach, esophagogastric junction revealed  multiple antral erosions, otherwise gastric mucosa appeared normal.  Pylorus  was patent, easily traversed.  Examination of the bulb and second portion  revealed no abnormalities.   THERAPIES/DIAGNOSTIC MANEUVERS PERFORMED:  A  56-French Maloney dilator was  passed to full insertion with ease.  Subsequently a 58 Maloney dilator was  passed fully with ease to the laryngeal nerve.  No apparent complications  related to passage of the dilator.  The patient tolerated the procedure well  and was reactive endoscopy.   IMPRESSION:  1.  Schatzki's ring otherwise normal esophagus, status post dilation as      described above.  2.  Antral erosions otherwise normal stomach.  3.  Patent pylorus.  Normal D1, D2.   Previously a CLO test was negative.  The antral erosions in the stomach may  be related to aspirin effect which he takes daily.   RECOMMENDATIONS:  1.  Continue omeprazole 20 mg daily with concomitant aspirin therapy.  2.  Check H. pylori serologies to complete the evaluation.  3.  The patient is to call me in the future if he has any recurrent      difficulties swallowing.      Jonathon Bellows, M.D.  Electronically Signed     RMR/MEDQ  D:  02/16/2005  T:  02/16/2005  Job:  [id number]   cc:   Delaney Meigs, M.D.  Fax: [phone]

## 2010-07-03 NOTE — Assessment & Plan Note (Signed)
HEALTHCARE                            CARDIOLOGY OFFICE NOTE   NAME:[name], [name]                     MRN:          [id number]  DATE:03/16/2006                            DOB:          06-Jun-1935    PRIMARY CARE PHYSICIAN:  [name], M.D.   CARDIOLOGIST:  [name], [name],FACC   ORTHOPEDIST:  [name], M.D.   REASON FOR CONSULTATION:  Preoperative cardiac evaluation.   HISTORY OF PRESENT ILLNESS:  [name] is a very pleasant 75 year old  male with history of severe mitral regurgitation status post mitral  valve repair in September 2003 by Dr. Janey [name] at [facility].  He  presents today for a preoperative cardiac evaluation prior to his  upcoming left rotator cuff surgery.  At the time of his valve repair, he  underwent presurgical cardiac catheterization which showed normal  coronary arteries and normal LV function.  He underwent the operation  without any difficulty.  He appears to have had some perioperative  atrial fibrillation and was on amiodarone for a short period of time.  He saw Dr. Andee [name] last year and had an echocardiogram that showed normal  LV function with no residual mitral regurgitation.  Currently he is  doing quite well.  He is exercising 3-4 times a week, walking a  treadmill up to 3.6 miles and hour on a 2% grade for 30 minutes without  any chest pain or shortness of breath.  He has not had any heart failure  symptoms, no neurologic symptoms.  He does tell me that previously he  had a history of snoring, and his wife was concerned about some apneic  episodes, but he says these got much better after his surgery.  He also  has a history of significant bradycardia and is intolerant of BETA  BLOCKERS.   REVIEW OF SYSTEMS:  Notable for severe left arm pain and numbness due to  his rotator cuff as well as gastroesophageal reflux disease, arthritis,  and hiatal hernia.  Remainder of Review of Systems is  negative except  for HPI and Problem List.   PAST MEDICAL HISTORY:  1. Severe mitral regurgitation secondary to mitral valve prolapse      status post mitral valve repair by Dr. Janey [name] in 2003.  Preop      cardiac catheterization showed normal coronary arteries and normal      LV function.  2. Paroxysmal atrial fibrillation, postoperatively.  3. Significant sinus bradycardia.  4. Gastroesophageal reflux disease.  5. History of gallstones status post cholecystectomy.  6. Hypertension.  7. Hyperlipidemia.   CURRENT MEDICATIONS:  1. Aspirin 81 mg a day.  2. Lipitor 5 a day.  3. Prilosec 20 a day.  4. Lisinopril 10 a day.  5. Fish oil.  6. Multivitamins.   ALLERGIES:  No known drug allergies.   SOCIAL HISTORY:  He is married.  He is retired.  He quit smoking almost  20 years ago.  He does not drink alcoholic beverages.   FAMILY HISTORY:  Mother and father have both passed away.  He had three  brother who have also passed away.  There is no family history of  premature coronary artery disease.   PHYSICAL EXAMINATION:  GENERAL:  He is well appearing, in no acute  distress, ambulates around the clinic without any respiratory  difficulty.  VITAL SIGNS: Blood pressure 140/82, heart rate 49.  Weight 242.  HEENT:  Sclerae anicteric, EOMI.  There is no xanthelasma.  Mucous  membranes are moist.  NECK:  Thick, supple.  There is no JVD.  Carotids are 2+ bilaterally  without bruits.  There is no lymphadenopathy or thyromegaly.  CARDIAC: Regular rate and rhythm.  Soft S4.  There is no mitral  regurgitation.  LUNGS: Clear.  ABDOMEN:  Obese, nontender, nondistended.  No hepatosplenomegaly, no  bruits, no masses . Good bowel sounds.  EXTREMITIES: Warm with no cyanosis, clubbing, or edema.  Good distal  pulses.  NEUROLOGIC: Alert and oriented x3.  Cranial nerves II-XII intact.  Moves  all four extremities without difficulty.  Affect is bright.   EKG shows marked sinus bradycardia at a  rate of 49 with an incomplete  right bundle branch block.  No significant ST-T wave abnormalities.   ASSESSMENT AND PLAN:  From a cardiac point of view, he is quite low risk  for perioperative complications in light of his normal coronary arteries  on catheterization just a few years ago and his excellent exercise  tolerance.  I suggested that he can proceed to surgery without any  further cardiac workup.  Unfortunately, he is unable to tolerate a beta  blocker due to his bradycardia.  I did suggest to him that I suspect he  may be at risk for sleep apnea, and it would be reasonable to follow up  with Dr. Lysbeth [name] regarding a possible sleep study.  I have also suggested  to him that his central obesity puts him at risk for metabolic syndrome  and development of diabetes and  that he should continue his exercising and try dieting to lose weight.  He will follow up with Drs. [name] and [name] after surgery to help  address these issues.     [name], [name]  Electronically Signed    DRB/MedQ  DD: 03/16/2006  DT: 03/16/2006  Job #: [id number]   cc:   [name], M.D.  [name], [name],FACC  Fax immediately to Dr. Pleas Koch [phone]

## 2010-07-03 NOTE — Cardiovascular Report (Signed)
[place]. [facility]  Patient:    [name], [name] Visit Number: [id number] MRN: [id number]          Service Type: CAT Location: [facility] [id number] [id number] Attending Physician:  Lenoria Farrier Dictated by:   Everardo Beals Juanda Chance, M.D. [facility] Proc. Date: 08/16/01 Admit Date:  08/16/2001 Discharge Date: 08/16/2001   CC:         Colon Flattery, D.O.  Lewayne Bunting, M.D.   Cardiac Catheterization  INDICATIONS:  Mr. Davidian is 75 years old and has known valvular heart disease. He has recently been having increasing symptoms of shortness of breath and has been evaluated by Dr. Andee Lineman with an echocardiogram which has shown severe mitral regurgitation with well preserved left ventricular function and an enlarged left atrium.  His transesophageal echocardiogram has shown predominantly posterior mitral leaflet prolapse which appears to be repairable by Dr. Felisa Bonier account.  He is brought in today for a catheterization to evaluate his coronaries in anticipation of needing mitral valve repair.  DESCRIPTION OF PROCEDURE:  Left heart catheterization was performed percutaneously via the right femoral artery using arterial sheath and a 6 French preformed coronary catheters.  A front wall arterial puncture was performed and Omnipaque contrast was used.  Right heart catheterization was performed percutaneously via the right femoral vein using a venous sheath and Swan-Ganz thermodilution catheter.  We had some difficulty selectively injecting the right femoral artery because of a superior takeoff and we obtained fairly good subselective injections with a left bypass graft catheter. The patient tolerated the procedure well and left the laboratory in satisfactory condition.  RESULTS: 1. The left main coronary artery was free of significant disease. 2. The left anterior descending artery gave rise to two septal perforators    and two diagonal branches.  There LAD was irregular with no  major    obstruction. 3. The circumflex artery was a dominant vessel that gave rise to two marginal    branches, two posterolateral branches, and a posterior descending branch.    These vessels were free of significant disease. 4. The right coronary artery was a nondominant vessel that supplied only    right ventricular branches and was free of significant disease. 5. The left ventriculogram performed in the RAO projection showed good wall    motion with no areas of hypokinesis.  There was 3 to 4+ or moderately    severe to severe mitral regurgitation with complate opacification of the    left atrium with density equal to the left ventricle.  I could not tell    about filling of the pulmonary veins.  The estimated ejection fraction was    65%.  HEMODYNAMIC DATA:  The right atrial pressure was 8 mean, the right ventricular pressure was 33/8.  Pulmonary artery pressure was 33/12 with a mean of 21. The pulmonary wedge pressure was 13 and rose to 17 by the end of the procedure. The aortic pressure was 134/68 with a mean of 95.  Cardiac output/cardiac index was 3.8/1.7 liters/minute/square meter.  CONCLUSION: 1. Severe mitral regurgitation with good left ventricular function. 2. No significant obstructive coronary artery disease.  RECOMMENDATIONS:  Arrangements will be made for the patient to have mitral valve repair.  Dr. Andee Lineman will make those arrangements. Dictated by:   Everardo Beals Juanda Chance, M.D. [facility] Attending Physician:  Lenoria Farrier DD:  08/16/01 TD:  08/20/01 Job: [id number]

## 2010-09-22 ENCOUNTER — Encounter: Payer: Self-pay | Admitting: Cardiology

## 2010-09-23 ENCOUNTER — Ambulatory Visit (INDEPENDENT_AMBULATORY_CARE_PROVIDER_SITE_OTHER): Payer: Medicare Other | Admitting: Physician Assistant

## 2010-09-23 ENCOUNTER — Encounter: Payer: Self-pay | Admitting: Cardiology

## 2010-09-23 DIAGNOSIS — I10 Essential (primary) hypertension: Secondary | ICD-10-CM

## 2010-09-23 DIAGNOSIS — I38 Endocarditis, valve unspecified: Secondary | ICD-10-CM

## 2010-09-23 DIAGNOSIS — I2510 Atherosclerotic heart disease of native coronary artery without angina pectoris: Secondary | ICD-10-CM

## 2010-09-23 DIAGNOSIS — E785 Hyperlipidemia, unspecified: Secondary | ICD-10-CM

## 2010-09-23 NOTE — Assessment & Plan Note (Signed)
We'll reassess integrity of mitral valve repair with surveillance echocardiogram.

## 2010-09-23 NOTE — Assessment & Plan Note (Signed)
We'll request most recent lipid profile from Dr. Joyce Copa office. Patient previously on Lipitor for several years, but discontinued secondary to myalgia. Would recommend resumption of lipid lowering treatment, if LDL is not at goal.

## 2010-09-23 NOTE — Assessment & Plan Note (Signed)
Well-controlled, followed by Dr. Lysbeth Galas

## 2010-09-23 NOTE — Patient Instructions (Signed)
Your physician has requested that you have an echocardiogram. Echocardiography is a painless test that uses sound waves to create images of your heart. It provides your doctor with information about the size and shape of your heart and how well your heart's chambers and valves are working. This procedure takes approximately one hour. There are no restrictions for this procedure. If the results of your test are normal or stable, you will receive a letter.  If they are abnormal, the nurse will contact you by phone. Your physician wants you to follow up in: 6 months.  You will receive a reminder letter in the mail one-two months in advance.  If you don't receive a letter, please call our office to schedule the follow up appointment

## 2010-09-23 NOTE — Assessment & Plan Note (Signed)
Nonobstructive disease by prior catheterizations, quiescent on current therapy

## 2010-09-23 NOTE — Progress Notes (Signed)
HPI: patient presents for scheduled 6 month followup.  He denies any interim development of exertional CP. He does have mild, chronic DOE, but no recent exacerbation. He denies symptoms suggestive of heart failure. He denies tachycardia palpitations.  His main complaint is that of this intermittent sensation of bilateral shoulder/upper extremity "tingling", which has occurred approximately 3 times in the last several months. Of note, this is a long-standing intermittent problem, which preceded his valvular surgery. Symptoms are sudden in onset, lasting only a few seconds in duration. There is no loss of consciousness. There is no prodromal CP, tachypalpitations, diaphoresis, or nausea. He was referred by Dr. Lysbeth Galas for an x-ray, for evaluation of "pinched nerve". This apparently showed only some mild arthritis.  No Known Allergies  Current Outpatient Prescriptions on File Prior to Visit  Medication Sig Dispense Refill  . aspirin 81 MG tablet Take 81 mg by mouth daily.        . fish oil-omega-3 fatty acids 1000 MG capsule Take 1 g by mouth daily.        Marland Kitchen lisinopril (PRINIVIL,ZESTRIL) 10 MG tablet Take 10 mg by mouth daily.        . Multiple Vitamin (MULTIVITAMIN) tablet Take 1 tablet by mouth daily.        Marland Kitchen omeprazole (PRILOSEC) 20 MG capsule Take 20 mg by mouth daily.          Past Medical History  Diagnosis Date  . Endocarditis, valve unspecified, unspecified cause   . Coronary atherosclerosis of unspecified type of vessel, native or graft   . Atrial fibrillation     Paroxysmal  . Shortness of breath   . Unspecified sleep apnea   . Unspecified essential hypertension   . Physical deconditioning     Past Surgical History  Procedure Date  . Cholecystectomy   . Mv repair   . Vasectomy     History   Social History  . Marital Status: Married    Spouse Name: GLORIA    Number of Children: 2  . Years of Education: N/A   Occupational History  .  Machine Environmental consultant     RETIRED     Social History Main Topics  . Smoking status: Former Smoker -- 3.0 packs/day    Types: Cigarettes    Quit date: 04/30/2010  . Smokeless tobacco: Not on file   Comment:  Year Quit: 1990   . Alcohol Use: No  . Drug Use: No  . Sexually Active: Not on file   Other Topics Concern  . Not on file   Social History Narrative  . No narrative on file    No family history on file.  ROS: Negative for exertional chest pain, orthopnea, PND, lower extremity edema, palpitations, presyncope/syncope, claudication, reflux, hematuria, hematochezia, or melena. Remaining systems reviewed, and are negative.   PHYSICAL EXAM:  BP 122/66  Pulse 56  Resp 20  Ht 5\' 11"  (1.803 m)  Wt 242 lb 12.8 oz (110.133 kg)  BMI 33.86 kg/m2  SpO2 94%  GENERAL: well-nourished, well-developed; NAD HEENT: NCAT, PERRLA, EOMI; sclera clear; no xanthelasma NECK: palpable bilateral carotid pulses, no bruits; no JVD; no TM LUNGS: CTA bilaterally CARDIAC: RRR (S1, S2); no significant murmurs; no rubs or gallops ABDOMEN: soft, non-tender; intact BS EXTREMETIES: intact distal pulses; no significant peripheral edema SKIN: warm/dry; no obvious rash/lesions MUSCULOSKELETAL: no joint deformity NEURO: no focal deficit; NL affect   EKG:    ASSESSMENT & PLAN:

## 2010-09-30 ENCOUNTER — Other Ambulatory Visit (INDEPENDENT_AMBULATORY_CARE_PROVIDER_SITE_OTHER): Payer: Medicare Other | Admitting: *Deleted

## 2010-09-30 DIAGNOSIS — I2510 Atherosclerotic heart disease of native coronary artery without angina pectoris: Secondary | ICD-10-CM

## 2010-09-30 DIAGNOSIS — I38 Endocarditis, valve unspecified: Secondary | ICD-10-CM

## 2010-10-07 ENCOUNTER — Telehealth: Payer: Self-pay | Admitting: Cardiology

## 2010-10-07 NOTE — Telephone Encounter (Signed)
Would like stress test results call [phone]

## 2010-10-08 NOTE — Telephone Encounter (Signed)
Patient notified and verbalized understanding. 

## 2010-10-29 DIAGNOSIS — I498 Other specified cardiac arrhythmias: Secondary | ICD-10-CM

## 2010-12-16 ENCOUNTER — Encounter: Payer: Self-pay | Admitting: Cardiology

## 2010-12-16 ENCOUNTER — Ambulatory Visit (INDEPENDENT_AMBULATORY_CARE_PROVIDER_SITE_OTHER): Payer: Medicare Other | Admitting: Cardiology

## 2010-12-16 VITALS — BP 118/77 | HR 58 | Ht 71.0 in | Wt 241.0 lb

## 2010-12-16 DIAGNOSIS — I10 Essential (primary) hypertension: Secondary | ICD-10-CM

## 2010-12-16 DIAGNOSIS — R42 Dizziness and giddiness: Secondary | ICD-10-CM

## 2010-12-16 DIAGNOSIS — I2510 Atherosclerotic heart disease of native coronary artery without angina pectoris: Secondary | ICD-10-CM

## 2010-12-16 DIAGNOSIS — I38 Endocarditis, valve unspecified: Secondary | ICD-10-CM

## 2010-12-16 DIAGNOSIS — I4891 Unspecified atrial fibrillation: Secondary | ICD-10-CM

## 2010-12-16 NOTE — Progress Notes (Signed)
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 The patient was recently hospitalized with dizziness and hypotension. He was just recently started on Flomax and Avodart. After he stopped taking the medications he had no recurrent symptoms. He was seen in the hospital by cardiology and was also noted to have significant bradycardia with a heart rate of 48 beats per minutes. Both medications were discontinued and the patient has had no recurrent problems. Had a recent echocardiogram which was within normal limits.   PMH: reviewed and listed in Problem List in Electronic Records (and see below)  Allergies/SH/FHX : available in Electronic Records for review  Medications: Current Outpatient Prescriptions on File Prior to Visit  Medication Sig Dispense Refill  . aspirin 81 MG tablet Take 81 mg by mouth daily.        . fish oil-omega-3 fatty acids 1000 MG capsule Take 1 g by mouth daily.        Marland Kitchen lisinopril (PRINIVIL,ZESTRIL) 10 MG tablet Take 10 mg by mouth daily.        . Multiple Vitamin (MULTIVITAMIN) tablet Take 1 tablet by mouth daily.        Marland Kitchen omeprazole (PRILOSEC) 20 MG capsule Take 20 mg by mouth daily.          ROS: No nausea or vomiting. No fever or chills.No melena or hematochezia.No bleeding.No claudication  Physical Exam: BP 118/77  Pulse 58  Ht 5\' 11"  (1.803 m)  Wt 241 lb (109.317 kg)  BMI 33.61 kg/m2 General: Well-nourished white male in no apparent distress Neck: Normal carotid upstroke no carotid bruits. No thyromegaly Lungs: Clear breath sounds bilaterally no wheezing Cardiac: Regular rate and rhythm normal S1-S2 no murmur rubs or gallops. Vascular: Normal distal pulses peripherally Skin: Warm and dry  12lead ECG: Normal sinus rhythm incomplete right bundle branch block nonspecific ST-T wave  changes Limited bedside ECHO:N/A

## 2010-12-16 NOTE — Assessment & Plan Note (Signed)
No ischemia workup required

## 2010-12-16 NOTE — Assessment & Plan Note (Signed)
History of endocarditis and status post Cosgrove ring. Normal echocardiogram with normal valve function

## 2010-12-16 NOTE — Assessment & Plan Note (Signed)
Blood pressure stable ? ?

## 2010-12-16 NOTE — Assessment & Plan Note (Signed)
Patient remains in normal sinus rhythm. 

## 2010-12-16 NOTE — Assessment & Plan Note (Signed)
Medications discontinued no recurrent episodes

## 2010-12-16 NOTE — Patient Instructions (Signed)
   Referral to Dr. Baldo Ash (urology) - their office will contact you with appointment  Your physician wants you to follow up in: 6 months.  You will receive a reminder letter in the mail one-two months in advance.  If you don't receive a letter, please call our office to schedule the follow up appointment

## 2010-12-17 ENCOUNTER — Other Ambulatory Visit: Payer: Self-pay | Admitting: *Deleted

## 2010-12-17 DIAGNOSIS — N400 Enlarged prostate without lower urinary tract symptoms: Secondary | ICD-10-CM

## 2011-06-14 ENCOUNTER — Encounter: Payer: Self-pay | Admitting: Cardiology

## 2011-06-14 ENCOUNTER — Ambulatory Visit (INDEPENDENT_AMBULATORY_CARE_PROVIDER_SITE_OTHER): Payer: Medicare Other | Admitting: Cardiology

## 2011-06-14 VITALS — BP 130/80 | HR 48 | Ht 71.0 in | Wt 234.0 lb

## 2011-06-14 DIAGNOSIS — I10 Essential (primary) hypertension: Secondary | ICD-10-CM

## 2011-06-14 DIAGNOSIS — Z0389 Encounter for observation for other suspected diseases and conditions ruled out: Secondary | ICD-10-CM | POA: Insufficient documentation

## 2011-06-14 DIAGNOSIS — Z9889 Other specified postprocedural states: Secondary | ICD-10-CM | POA: Insufficient documentation

## 2011-06-14 DIAGNOSIS — I4891 Unspecified atrial fibrillation: Secondary | ICD-10-CM

## 2011-06-14 DIAGNOSIS — I38 Endocarditis, valve unspecified: Secondary | ICD-10-CM | POA: Insufficient documentation

## 2011-06-14 DIAGNOSIS — R42 Dizziness and giddiness: Secondary | ICD-10-CM

## 2011-06-14 DIAGNOSIS — I2510 Atherosclerotic heart disease of native coronary artery without angina pectoris: Secondary | ICD-10-CM

## 2011-06-14 NOTE — Patient Instructions (Signed)
Continue all current medications. Your physician wants you to follow up in:  1 year.  You will receive a reminder letter in the mail one-two months in advance.  If you don't receive a letter, please call our office to schedule the follow up appointment   

## 2011-06-14 NOTE — Assessment & Plan Note (Signed)
Normal LV systolic function no evidence of significant mitral regurgitation on exam.

## 2011-06-14 NOTE — Assessment & Plan Note (Signed)
Stable no evidence of hypertension.

## 2011-06-14 NOTE — Assessment & Plan Note (Signed)
Postoperative the patient remains in normal sinus rhythm. Predominately sinus bradycardia without symptoms.

## 2011-06-14 NOTE — Progress Notes (Signed)
Peyton Bottoms, MD, [facility] Board Certified in Adult Cardiovascular Medicine,Internal Medicine and Critical Care Medicine    CC: Followup patient with mitral valve repair  HPI:  The patient reports no recurrent chest pain or shortness of breath. Coronary artery disease has been excluded. Mitral valve repair his durable and he remains in normal sinus rhythm. Dizziness has resolved after he discontinued Flomax. He does report some weakness in both shoulders and arms which last a few minutes and he will discuss this with his primary care physician. He denies any orthopnea PND palpitations or syncope  PMH: reviewed and listed in Problem List in Electronic Records (and see below) Past Medical History  Diagnosis Date  . Endocarditis, valve unspecified, unspecified cause   . Shortness of breath   . Unspecified sleep apnea   . Unspecified essential hypertension   . Physical deconditioning   . H/O mitral valve repair      postoperative ring with postoperative atrial fibrillation  . Coronary artery disease excluded    Past Surgical History  Procedure Date  . Cholecystectomy   . Mv repair   . Vasectomy     Allergies/SH/FHX : available in Electronic Records for review  No Known Allergies History   Social History  . Marital Status: Married    Spouse Name: GLORIA    Number of Children: 2  . Years of Education: N/A   Occupational History  .  Machine Environmental consultant     RETIRED   Social History Main Topics  . Smoking status: Former Smoker -- 3.0 packs/day for 35 years    Types: Cigarettes    Quit date: 02/16/1988  . Smokeless tobacco: Never Used   Comment:  Year Quit: 1990   . Alcohol Use: No  . Drug Use: No  . Sexually Active: Not on file   Other Topics Concern  . Not on file   Social History Narrative  . No narrative on file   No family history on file.  Medications: Current Outpatient Prescriptions  Medication Sig Dispense Refill  . aspirin 81 MG tablet Take 81 mg  by mouth daily.        . AVODART 0.5 MG capsule Take 1 capsule by mouth Daily.      . fish oil-omega-3 fatty acids 1000 MG capsule Take 1 g by mouth daily.        Marland Kitchen lisinopril (PRINIVIL,ZESTRIL) 10 MG tablet Take 10 mg by mouth daily.        . Multiple Vitamin (MULTIVITAMIN) tablet Take 1 tablet by mouth daily.        Marland Kitchen omeprazole (PRILOSEC) 20 MG capsule Take 20 mg by mouth daily.          ROS: No nausea or vomiting. No fever or chills.No melena or hematochezia.No bleeding.No claudication  Physical Exam: BP 130/80  Pulse 48  Ht 5\' 11"  (1.803 m)  Wt 234 lb (106.142 kg)  BMI 32.64 kg/m2 General: Well-nourished white male in no distress. Neck: Normal carotid upstroke no carotid bruits. No thyromegaly. Nonnodular thyroid JVP is 6 cm Lungs: Clear breath sounds bilaterally without wheezing Cardiac: Regular rate and rhythm, bradycardic normal S1-S2 no significant murmurs. Vascular: No edema. Normal distal pulses Skin: Warm and dry Physcologic: Normal affect  12lead ECG: Single lead electrocardiogram obtained. Sinus bradycardia with heart rates between 50-60 beats per minute Limited bedside ECHO:N/A No images are attached to the encounter.   Assessment and Plan  HYPERTENSION Stable no evidence of hypertension.  ATRIAL  FIBRILLATION Postoperative the patient remains in normal sinus rhythm. Predominately sinus bradycardia without symptoms.  Coronary artery disease excluded The patient reports no chest pain.  H/O mitral valve repair Normal LV systolic function no evidence of significant mitral regurgitation on exam.  Dizziness Resolved after discontinuation of Flomax.

## 2011-06-14 NOTE — Assessment & Plan Note (Signed)
Resolved after discontinuation of Flomax.

## 2011-06-14 NOTE — Assessment & Plan Note (Signed)
The patient reports no chest pain.

## 2011-06-25 ENCOUNTER — Encounter (HOSPITAL_COMMUNITY): Payer: Self-pay | Admitting: Emergency Medicine

## 2011-06-25 ENCOUNTER — Emergency Department (HOSPITAL_COMMUNITY)
Admission: EM | Admit: 2011-06-25 | Discharge: 2011-06-25 | Disposition: A | Discharge status: Home / Self Care | Payer: Medicare Other | Attending: Emergency Medicine | Admitting: Emergency Medicine

## 2011-06-25 DIAGNOSIS — R5381 Other malaise: Secondary | ICD-10-CM | POA: Insufficient documentation

## 2011-06-25 DIAGNOSIS — B349 Viral infection, unspecified: Secondary | ICD-10-CM

## 2011-06-25 DIAGNOSIS — R112 Nausea with vomiting, unspecified: Secondary | ICD-10-CM | POA: Insufficient documentation

## 2011-06-25 DIAGNOSIS — R0602 Shortness of breath: Secondary | ICD-10-CM | POA: Insufficient documentation

## 2011-06-25 DIAGNOSIS — I10 Essential (primary) hypertension: Secondary | ICD-10-CM | POA: Insufficient documentation

## 2011-06-25 DIAGNOSIS — Z79899 Other long term (current) drug therapy: Secondary | ICD-10-CM | POA: Insufficient documentation

## 2011-06-25 DIAGNOSIS — E860 Dehydration: Secondary | ICD-10-CM

## 2011-06-25 DIAGNOSIS — Z7982 Long term (current) use of aspirin: Secondary | ICD-10-CM | POA: Insufficient documentation

## 2011-06-25 DIAGNOSIS — B9789 Other viral agents as the cause of diseases classified elsewhere: Secondary | ICD-10-CM | POA: Insufficient documentation

## 2011-06-25 DIAGNOSIS — R42 Dizziness and giddiness: Secondary | ICD-10-CM | POA: Insufficient documentation

## 2011-06-25 DIAGNOSIS — R197 Diarrhea, unspecified: Secondary | ICD-10-CM | POA: Insufficient documentation

## 2011-06-25 LAB — DIFFERENTIAL
Basophils Relative: 0 % (ref 0–1)
Eosinophils Absolute: 0.1 10*3/uL (ref 0.0–0.7)
Eosinophils Relative: 1 % (ref 0–5)
Lymphs Abs: 0.9 10*3/uL (ref 0.7–4.0)
Monocytes Relative: 7 % (ref 3–12)
Neutrophils Relative %: 86 % — ABNORMAL HIGH (ref 43–77)

## 2011-06-25 LAB — CBC
Hemoglobin: 16.1 g/dL (ref 13.0–17.0)
MCH: 32.6 pg (ref 26.0–34.0)
MCHC: 35.9 g/dL (ref 30.0–36.0)
MCV: 90.9 fL (ref 78.0–100.0)
Platelets: 211 10*3/uL (ref 150–400)
RBC: 4.94 MIL/uL (ref 4.22–5.81)

## 2011-06-25 LAB — COMPREHENSIVE METABOLIC PANEL
BUN: 22 mg/dL (ref 6–23)
CO2: 25 mEq/L (ref 19–32)
Calcium: 9.2 mg/dL (ref 8.4–10.5)
Chloride: 105 mEq/L (ref 96–112)
Creatinine, Ser: 0.91 mg/dL (ref 0.50–1.35)
GFR calc Af Amer: >90 mL/min (ref >90)
GFR calc non Af Amer: 82 mL/min — ABNORMAL LOW (ref >90)
Total Bilirubin: 0.3 mg/dL (ref 0.3–1.2)

## 2011-06-25 MED ORDER — SODIUM CHLORIDE 0.9 % IV BOLUS (SEPSIS)
1000.0000 mL | INTRAVENOUS | Status: AC
  Administered 2011-06-25: 1000 mL via INTRAVENOUS

## 2011-06-25 MED ORDER — ONDANSETRON HCL 4 MG/2ML IJ SOLN
4.0000 mg | Freq: Once | INTRAMUSCULAR | Status: AC
  Administered 2011-06-25: 4 mg via INTRAVENOUS
  Filled 2011-06-25: qty 2

## 2011-06-25 NOTE — ED Notes (Signed)
Patient given discharge paperwork; went over discharge instructions with patient.  Patient instructed to follow up with primary care physician/referral if symptoms do not improve within two days, to drink plenty of water, and to return to the ED for new, worsening, or concerning symptoms.

## 2011-06-25 NOTE — ED Notes (Signed)
Pt cleaned, bed and gown changed. Pt ambulated to bathroom and back with steady gait, denied dizziness. MD at bed side at this time.

## 2011-06-25 NOTE — ED Notes (Addendum)
Pt eating dinner when had a sudden onset of diaphoresis, no LOC per pt wife. CBG 118 by EMS at scene, EMS report pt hypotensive upon arrival unable to palpate IV access and fl bolus of given and bp taken 102 systolic. No c/o of chest pain or discomfort on arrival to ED. Pt told EMS upon arrival he felt he needed to have a BM.

## 2011-06-25 NOTE — ED Notes (Signed)
Received bedside report from [place], [employer] fundraiser.  Patient currently resting quietly in bed; no respiratory or acute distress noted.  Patient updated on plan of care; informed patient that lab work has been ordered and that MD has ordered medication for nausea.  Introduced self to patient and updated whiteboard in room.  Patient or family has no other questions or concerns at this time.  Will continue to monitor.

## 2011-06-25 NOTE — ED Provider Notes (Signed)
76 year old male as felt fatigued for the last 2 days. Today, he was digging in his garden and was doing very reasonably well. He would not eat at a restaurant and his wife said that he passed out. Ambulance was called and today he started having a sense that he had was about to have diarrhea. On arrival in the emergency department, he vomited once and had a large diarrheal bowel movement. He is feeling considerably better now.   Date: 06/25/2011  Rate: 65  Rhythm: normal sinus rhythm  QRS Axis: normal  Intervals: normal  ST/T Wave abnormalities: normal  Conduction Disutrbances:Incomplete right bundle-branch block  Narrative Interpretation: Incomplete right bundle-branch block, otherwise normal ECG. No old ECG available for comparison.  Old EKG Reviewed: none available    Dione Booze, MD 06/25/11 2022

## 2011-06-25 NOTE — ED Provider Notes (Signed)
History     CSN: [id number]  Arrival date & time 06/25/11  1827   None     Chief Complaint  Patient presents with  . Hypotension    (Consider location/radiation/quality/duration/timing/severity/associated sxs/prior treatment) Patient is a 76 y.o. male presenting with weakness. The history is provided by the patient.  Weakness The primary symptoms include dizziness. Primary symptoms do not include headaches, fever, nausea or vomiting. The symptoms began 2 days ago. The episode lasted 2 days. The symptoms are unchanged. The neurological symptoms are diffuse. Context: after doing yardwork.  Dizziness also occurs with weakness. Dizziness does not occur with nausea or vomiting.  Additional symptoms include weakness.    Past Medical History  Diagnosis Date  . Endocarditis, valve unspecified, unspecified cause   . Shortness of breath   . Unspecified sleep apnea   . Unspecified essential hypertension   . Physical deconditioning   . H/O mitral valve repair      postoperative ring with postoperative atrial fibrillation  . Coronary artery disease excluded     Past Surgical History  Procedure Date  . Cholecystectomy   . Mv repair   . Vasectomy     History reviewed. No pertinent family history.  History  Substance Use Topics  . Smoking status: Former Smoker -- 3.0 packs/day for 35 years    Types: Cigarettes    Quit date: 02/16/1988  . Smokeless tobacco: Never Used   Comment:  Year Quit: 1990   . Alcohol Use: No      Review of Systems  Constitutional: Negative for fever.  HENT: Negative for rhinorrhea, drooling and neck pain.   Eyes: Negative for pain.  Respiratory: Negative for cough and shortness of breath.   Cardiovascular: Negative for chest pain and leg swelling.  Gastrointestinal: Negative for nausea, vomiting, abdominal pain and diarrhea.  Genitourinary: Negative for dysuria and hematuria.  Musculoskeletal: Negative for gait problem.  Skin: Negative for color  change.  Neurological: Positive for dizziness and weakness. Negative for numbness and headaches.  Hematological: Negative for adenopathy.  Psychiatric/Behavioral: Negative for behavioral problems.  All other systems reviewed and are negative.    Allergies  Review of patient's allergies indicates no known allergies.  Home Medications   Current Outpatient Rx  Name Route Sig Dispense Refill  . ASPIRIN 81 MG PO TABS Oral Take 81 mg by mouth daily.      . AVODART 0.5 MG PO CAPS Oral Take 1 capsule by mouth Daily.    . OMEGA-3 FATTY ACIDS 1000 MG PO CAPS Oral Take 1 g by mouth daily.      Marland Kitchen LISINOPRIL 10 MG PO TABS Oral Take 10 mg by mouth daily.      Marland Kitchen ONE-DAILY MULTI VITAMINS PO TABS Oral Take 1 tablet by mouth daily.      Marland Kitchen OMEPRAZOLE 20 MG PO CPDR Oral Take 20 mg by mouth daily.        BP 123/75  Pulse 60  Temp(Src) 98.6 F (37 C) (Oral)  Resp 16  SpO2 95%  Physical Exam  Constitutional: He is oriented to person, place, and time. He appears well-developed and well-nourished.  HENT:  Head: Normocephalic and atraumatic.  Right Ear: External ear normal.  Left Ear: External ear normal.  Nose: Nose normal.  Mouth/Throat: Oropharynx is clear and moist. No oropharyngeal exudate.  Eyes: Conjunctivae and EOM are normal. Pupils are equal, round, and reactive to light.  Neck: Normal range of motion. Neck supple.  Cardiovascular: Normal rate, regular  rhythm, normal heart sounds and intact distal pulses.  Exam reveals no gallop and no friction rub.   No murmur heard. Pulmonary/Chest: Effort normal and breath sounds normal. No respiratory distress. He has no wheezes.  Abdominal: Soft. Bowel sounds are normal. He exhibits distension (mild). There is no tenderness.  Musculoskeletal: Normal range of motion. He exhibits no edema and no tenderness.  Neurological: He is alert and oriented to person, place, and time.  Skin: Skin is warm and dry.  Psychiatric: He has a normal mood and affect.  His behavior is normal.    ED Course  Procedures (including critical care time)  Labs Reviewed  CBC - Abnormal; Notable for the following:    WBC 12.9 (*)    All other components within normal limits  DIFFERENTIAL - Abnormal; Notable for the following:    Neutrophils Relative 86 (*)    Neutro Abs 11.1 (*)    Lymphocytes Relative 7 (*)    All other components within normal limits  COMPREHENSIVE METABOLIC PANEL - Abnormal; Notable for the following:    Glucose, Bld 148 (*)    GFR calc non Af Amer 82 (*)    All other components within normal limits   No results found.   1. Dizziness   2. Viral syndrome   3. Mild dehydration       MDM  11:28 PM 76 y.o. male w hx of mitral valve repair pw worsening weakness and sudden onset dizziness while eating dinner this evening. BS 118 en route per ems. Pt had vomiting and diarrhea here. Now feeling better on exam. EMS reported sys BP of 103 upon arrival. Pt AFVSS here, he appears well on exam, he denies cp. Cath lab reports from 2003 and 2008 show non-obst CAD. Low suspicion for ACS. Suspect viral syndrome. Will get labs, ecg, IVF, zofran. Ecg non-contributory.   11:28 PM: Pt feeling better on exam. I have discussed the diagnosis/risks/treatment options with the patient and family and believe the pt to be eligible for discharge home to follow-up with pcp if no better in 2-3 days. We also discussed returning to the ED immediately if new or worsening sx occur. We discussed the sx which are most concerning (e.g., worsening weakness, chest pain) that necessitate immediate return. Any new prescriptions provided to the patient are listed below.  New Prescriptions   No medications on file    Clinical Impression 1. Dizziness   2. Viral syndrome   3. Mild dehydration         Purvis Sheffield, MD 06/25/11 2328

## 2011-06-25 NOTE — ED Notes (Signed)
Patient currently sitting up in bed; no respiratory or acute distress noted.  Patient updated on plan of care; informed patient that lab results are back and that we are currently waiting on EDP to come and talk to patient.  Patient has no other questions or concerns at this time; will continue to monitor.

## 2011-06-25 NOTE — ED Notes (Signed)
Dr. Preston Fleeting at bedside; patient given water (OKed by EDP).

## 2011-06-25 NOTE — ED Notes (Signed)
Waiting for discharge paperwork from EDP. 

## 2011-06-25 NOTE — ED Notes (Signed)
Pt had sudden onset of large amt of vomit.

## 2011-06-27 NOTE — ED Provider Notes (Signed)
I saw and evaluated the patient, reviewed the resident's note and I agree with the findings and plan.   Dione Booze, MD 06/27/11 [phone]

## 2011-07-02 ENCOUNTER — Encounter: Payer: Self-pay | Admitting: Family Medicine

## 2011-07-02 DIAGNOSIS — R55 Syncope and collapse: Secondary | ICD-10-CM

## 2011-07-05 ENCOUNTER — Telehealth: Payer: Self-pay | Admitting: Cardiology

## 2011-07-05 NOTE — Telephone Encounter (Signed)
Mrs. Pavlov called today requesting to speak with Dr. Andee Lineman in reference to an Echo for Mr. Willcutt. Please call [phone]

## 2011-07-05 NOTE — Telephone Encounter (Signed)
Please let patient know that I reviewed his echocardiogram which was completely within normal limits. Also his carotid Dopplers were within normal limits. We scheduled the patient for a CardioNet monitor to be mailed to his home I suspect his dizziness/presyncope is related to bradycardia. He needs a first available clinic appointment

## 2011-07-06 ENCOUNTER — Other Ambulatory Visit: Payer: Self-pay | Admitting: *Deleted

## 2011-07-06 ENCOUNTER — Other Ambulatory Visit (HOSPITAL_COMMUNITY): Payer: Self-pay | Admitting: Family Medicine

## 2011-07-06 DIAGNOSIS — M542 Cervicalgia: Secondary | ICD-10-CM

## 2011-07-06 DIAGNOSIS — R42 Dizziness and giddiness: Secondary | ICD-10-CM

## 2011-07-06 DIAGNOSIS — R55 Syncope and collapse: Secondary | ICD-10-CM

## 2011-07-06 NOTE — Telephone Encounter (Signed)
Wife Malachi Bonds) notified of below.  Permission given by husband.  States that Dr. Joyce Copa nurse had just called them to notify that carotids not normal - PMD suggesting he do MRA.    Discussed with Dr. Andee Lineman - he did review carotid dopplers again (PMD ordered, patient saw GD in hospital the day of test & asked him to review).  Impression was less than 50% stenosis in the right & left ICA.  Left vertebral artery within normal limits.  Right vertebral artery - abnormal doppler pattern with poor diastolic flow.  Significant stenosis could not be excluded & MRA of the neck was recommended by radiologist to further delineate.    Per Dr. Andee Lineman - informed wife that they should follow the instructions of the ordering physician at this time.  MD does not feel that particular test will give [place] the answer to all the problems at hand.    Cardionet monitor has been ordered & follow up OV has been scheduled.  Will discuss further at upcoming appointment on 6/20.

## 2011-07-07 ENCOUNTER — Ambulatory Visit (HOSPITAL_COMMUNITY)
Admission: RE | Admit: 2011-07-07 | Discharge: 2011-07-07 | Disposition: A | Discharge status: Home / Self Care | Payer: Medicare Other | Source: Ambulatory Visit | Attending: Family Medicine | Admitting: Family Medicine

## 2011-07-07 DIAGNOSIS — M542 Cervicalgia: Secondary | ICD-10-CM

## 2011-07-07 DIAGNOSIS — R42 Dizziness and giddiness: Secondary | ICD-10-CM | POA: Insufficient documentation

## 2011-07-07 MED ORDER — GADOBENATE DIMEGLUMINE 529 MG/ML IV SOLN
20.0000 mL | Freq: Once | INTRAVENOUS | Status: AC | PRN
  Administered 2011-07-07: 20 mL via INTRAVENOUS

## 2011-07-10 DIAGNOSIS — I498 Other specified cardiac arrhythmias: Secondary | ICD-10-CM

## 2011-07-10 DIAGNOSIS — R55 Syncope and collapse: Secondary | ICD-10-CM

## 2011-08-05 ENCOUNTER — Ambulatory Visit: Payer: Medicare Other | Admitting: Physician Assistant

## 2011-08-05 ENCOUNTER — Telehealth: Payer: Self-pay | Admitting: Cardiology

## 2011-08-05 NOTE — Telephone Encounter (Signed)
Spoke with patient's wife and canceled appt w/Serpe for today.  She would like for someone to call her with the monitor results before they go out of town this weekend.

## 2011-08-06 NOTE — Telephone Encounter (Signed)
Left message to return call 

## 2011-08-10 NOTE — Telephone Encounter (Signed)
Discussed monitor results with wife.  Sinus brady 50's.  Advised her nothing to do currently, seeing GD on 7/5.  MD can discuss further in detail at that time.  She verbalized understanding.

## 2011-08-16 ENCOUNTER — Ambulatory Visit (INDEPENDENT_AMBULATORY_CARE_PROVIDER_SITE_OTHER): Payer: Medicare Other | Admitting: Cardiology

## 2011-08-16 ENCOUNTER — Encounter: Payer: Self-pay | Admitting: Cardiology

## 2011-08-16 VITALS — BP 133/76 | HR 47 | Ht 71.0 in | Wt 237.4 lb

## 2011-08-16 DIAGNOSIS — E669 Obesity, unspecified: Secondary | ICD-10-CM | POA: Insufficient documentation

## 2011-08-16 DIAGNOSIS — I10 Essential (primary) hypertension: Secondary | ICD-10-CM

## 2011-08-16 DIAGNOSIS — R42 Dizziness and giddiness: Secondary | ICD-10-CM

## 2011-08-16 DIAGNOSIS — I6529 Occlusion and stenosis of unspecified carotid artery: Secondary | ICD-10-CM

## 2011-08-16 DIAGNOSIS — Z9889 Other specified postprocedural states: Secondary | ICD-10-CM

## 2011-08-16 DIAGNOSIS — I4891 Unspecified atrial fibrillation: Secondary | ICD-10-CM

## 2011-08-16 NOTE — Assessment & Plan Note (Signed)
The patient had no further spells. His bradycardia arrhythmia does not seem to be related. We reviewed this in detail. No further testing is indicated. Of note with his other neurologic complaints including the sensation he's describing over the shoulders I suggested the next that might be a neurologist at [facility]

## 2011-08-16 NOTE — Assessment & Plan Note (Signed)
He should have follow up in one year.

## 2011-08-16 NOTE — Assessment & Plan Note (Signed)
He had a stable valve repair on recent echo. No  further workup is indicated.

## 2011-08-16 NOTE — Assessment & Plan Note (Signed)
The patient understands the need to lose weight with diet and exercise. We have discussed specific strategies for this.  

## 2011-08-16 NOTE — Patient Instructions (Addendum)
Your physician recommends that you schedule a follow-up appointment in: 3 months with Dr. Andee Lineman. You will be given an appointment at the check out desk.  Your physician recommends that you continue on your current medications as directed. Please refer to the Current Medication list given to you today.

## 2011-08-16 NOTE — Progress Notes (Signed)
HPI  The patient presents for evaluation of an episode of dizziness and lightheadedness. This happened in late May and he was seen at the emergency room. He's had multiple tests following this. Get carotid Dopplers and MRA. He does have moderate nonobstructive bilateral carotid plaque. He wanted that monitor with bradycardia but no sustained dysrhythmias. Ultimately he thinks he had a reaction to antibiotics he was taking for about bite. Since his ER visit he is no longer having lightheaded spells, presyncope or syncope. He has rare palpitations. He denies any chest pressure, neck or arm discomfort. He denies any shortness of breath, PND or orthopnea. He doesn't exercise routinely but he does do some activities such as playing golf.  Of note he has been describing as a vague sensation of numbness that starts in his shoulders and up over his head. He says it feels like a claw being applied. It happens sporadically. He doesn't have any visual disturbance with it. He doesn't have syncope with it. Can't bring it on. The etiology of this is not clear.  No Known Allergies  Current Outpatient Prescriptions  Medication Sig Dispense Refill  . aspirin 81 MG tablet Take 81 mg by mouth daily.        . AVODART 0.5 MG capsule Take 1 capsule by mouth Daily.      . fish oil-omega-3 fatty acids 1000 MG capsule Take 1 g by mouth daily.        Marland Kitchen lisinopril (PRINIVIL,ZESTRIL) 10 MG tablet Take 10 mg by mouth daily.        . meclizine (ANTIVERT) 25 MG tablet Take 25 mg by mouth as needed.       . Multiple Vitamin (MULTIVITAMIN) tablet Take 1 tablet by mouth daily.        Marland Kitchen omeprazole (PRILOSEC) 20 MG capsule Take 20 mg by mouth daily.          Past Medical History  Diagnosis Date  . Endocarditis, valve unspecified, unspecified cause   . Shortness of breath   . Unspecified sleep apnea   . Unspecified essential hypertension   . Physical deconditioning   . H/O mitral valve repair      postoperative ring with  postoperative atrial fibrillation  . Coronary artery disease excluded     Past Surgical History  Procedure Date  . Cholecystectomy   . Mv repair   . Vasectomy     ROS:  Numbness on the bottom of his feet. Otherwise stated in the HPI and negative for all other systems.  PHYSICAL EXAM BP 133/76  Pulse 47  Ht 5\' 11"  (1.803 m)  Wt 237 lb 6.4 oz (107.684 kg)  BMI 33.11 kg/m2 GENERAL:  Well appearing HEENT:  Pupils equal round and reactive, fundi not visualized, oral mucosa unremarkable NECK:  No jugular venous distention, waveform within normal limits, carotid upstroke brisk and symmetric, no bruits, no thyromegaly LYMPHATICS:  No cervical, inguinal adenopathy LUNGS:  Clear to auscultation bilaterally BACK:  No CVA tenderness CHEST:  Well healed left thoracotomy scar HEART:  PMI not displaced or sustained,S1 and S2 within normal limits, no S3, no S4, no clicks, no rubs, no murmurs ABD:  Flat, positive bowel sounds normal in frequency in pitch, no bruits, no rebound, no guarding, no midline pulsatile mass, no hepatomegaly, no splenomegaly, obese EXT:  2 plus pulses throughout, no edema, no cyanosis no clubbing SKIN:  No rashes no nodules NEURO:  Cranial nerves II through XII grossly intact, motor grossly intact throughout  PSYCH:  Cognitively intact, oriented to person place and time   EKG:  Sinus bradycardia, rate 47, axis within normal limits, intervals within normal limits, RSR prime V1 and V2, no acute ST-T wave changes. 08/16/2011   ASSESSMENT AND PLAN

## 2011-08-16 NOTE — Assessment & Plan Note (Signed)
The blood pressure is at target. No change in medications is indicated. We will continue with therapeutic lifestyle changes (TLC).  

## 2011-08-20 ENCOUNTER — Ambulatory Visit: Payer: Medicare Other | Admitting: Cardiology

## 2011-10-14 ENCOUNTER — Other Ambulatory Visit: Payer: Self-pay | Admitting: Neurology

## 2011-10-14 DIAGNOSIS — R531 Weakness: Secondary | ICD-10-CM

## 2011-10-14 DIAGNOSIS — R209 Unspecified disturbances of skin sensation: Secondary | ICD-10-CM

## 2011-10-21 ENCOUNTER — Ambulatory Visit
Admission: RE | Admit: 2011-10-21 | Discharge: 2011-10-21 | Disposition: A | Discharge status: Home / Self Care | Payer: Medicare Other | Source: Ambulatory Visit | Attending: Neurology | Admitting: Neurology

## 2011-10-21 DIAGNOSIS — R209 Unspecified disturbances of skin sensation: Secondary | ICD-10-CM

## 2011-10-21 DIAGNOSIS — R531 Weakness: Secondary | ICD-10-CM

## 2011-12-02 ENCOUNTER — Ambulatory Visit: Payer: Medicare Other | Admitting: Cardiology

## 2012-03-16 DIAGNOSIS — R5381 Other malaise: Secondary | ICD-10-CM | POA: Insufficient documentation

## 2012-03-16 DIAGNOSIS — R209 Unspecified disturbances of skin sensation: Secondary | ICD-10-CM | POA: Insufficient documentation

## 2012-04-06 ENCOUNTER — Encounter: Payer: Self-pay | Admitting: Vascular Surgery

## 2012-04-07 ENCOUNTER — Encounter: Payer: Self-pay | Admitting: Vascular Surgery

## 2012-04-07 ENCOUNTER — Ambulatory Visit (INDEPENDENT_AMBULATORY_CARE_PROVIDER_SITE_OTHER): Payer: Medicare Other | Admitting: Vascular Surgery

## 2012-04-07 VITALS — BP 139/78 | HR 53 | Resp 18 | Ht 71.0 in | Wt 238.0 lb

## 2012-04-07 DIAGNOSIS — L97509 Non-pressure chronic ulcer of other part of unspecified foot with unspecified severity: Secondary | ICD-10-CM

## 2012-04-07 DIAGNOSIS — I739 Peripheral vascular disease, unspecified: Secondary | ICD-10-CM

## 2012-04-07 DIAGNOSIS — L98499 Non-pressure chronic ulcer of skin of other sites with unspecified severity: Secondary | ICD-10-CM

## 2012-04-07 DIAGNOSIS — I872 Venous insufficiency (chronic) (peripheral): Secondary | ICD-10-CM

## 2012-04-07 NOTE — Progress Notes (Signed)
[facility]  Referred by:  Billee Cashing, DPM [address] [place], [place] [address]  Reason for referral: possible bilateral ischemic feet  History of Present Illness  [name] is a 77 y.o. (1935/08/20) male who presents with chief complaint: pain in toes with color change.  Onset of symptom occurred few months ago, coldness and color change in toes.  Patient also noted some skin had broken down on L foot which healed over three week.  Pain is described as aching, severity 3-5/10, and associated with cold sensation in toes.  Patient notes paraesthsia and anesthesia in both feet over the last few months.  The patient denies any diabetes.  The patient went to a Dr. Ulice Brilliant for evaluation.  Some of the ABI were concerning for possible small vessel disease by report.  The patient has no rest pain symptoms also and no leg wounds/ulcers.  He has not had a history of intermittent claudication.  Atherosclerotic risk factors include: history of heavy smoking.  Past Medical History  Diagnosis Date  . Endocarditis, valve unspecified, unspecified cause   . Shortness of breath   . Unspecified sleep apnea   . Unspecified essential hypertension   . Physical deconditioning   . H/O mitral valve repair      postoperative ring with postoperative atrial fibrillation  . Coronary artery disease excluded     Past Surgical History  Procedure Laterality Date  . Cholecystectomy    . Mv repair    . Vasectomy      History   Social History  . Marital Status: Married    Spouse Name: GLORIA    Number of Children: 2  . Years of Education: N/A   Occupational History  .  Machine Environmental consultant     RETIRED   Social History Main Topics  . Smoking status: Former Smoker -- 3.00 packs/day for 35 years    Types: Cigarettes    Quit date: 02/16/1988  . Smokeless tobacco: Never Used     Comment:  Year Quit: 1990   . Alcohol Use: No  . Drug Use: No  . Sexually Active: Not on  file   Other Topics Concern  . Not on file   Social History Narrative  . No narrative on file    No family history on file.  Current Outpatient Prescriptions on File Prior to Visit  Medication Sig Dispense Refill  . aspirin 81 MG tablet Take 81 mg by mouth daily.        . AVODART 0.5 MG capsule Take 1 capsule by mouth Daily.      Marland Kitchen lisinopril (PRINIVIL,ZESTRIL) 10 MG tablet Take 10 mg by mouth daily.        . meclizine (ANTIVERT) 25 MG tablet Take 25 mg by mouth as needed.       . Multiple Vitamin (MULTIVITAMIN) tablet Take 1 tablet by mouth daily.        Marland Kitchen omeprazole (PRILOSEC) 20 MG capsule Take 20 mg by mouth daily.        . fish oil-omega-3 fatty acids 1000 MG capsule Take 1 g by mouth daily.         No current facility-administered medications on file prior to visit.    No Known Allergies   REVIEW OF SYSTEMS:  (Positives checked otherwise negative)  CARDIOVASCULAR:  [ ]  chest pain, [ ]  chest pressure, [ ]  palpitations, [ ]  shortness of breath when laying flat, [ ]  shortness  of breath with exertion,   [x]  pain in feet when walking, [ ]  pain in feet when laying flat, [ ]  history of blood clot in veins (DVT), [ ]  history of phlebitis, [ ]  swelling in legs, [x]  varicose veins  PULMONARY:  [ ]  productive cough, [ ]  asthma, [ ]  wheezing  NEUROLOGIC:  [ ]  weakness in arms or legs, [ ]  numbness in arms or legs, [ ]  difficulty speaking or slurred speech, [ ]  temporary loss of vision in one eye, [ ]  dizziness  HEMATOLOGIC:  [ ]  bleeding problems, [ ]  problems with blood clotting too easily  MUSCULOSKEL:  [ ]  joint pain, [ ]  joint swelling  GASTROINTEST:  [ ]   Vomiting blood, [ ]   Blood in stool     GENITOURINARY:  [ ]   Burning with urination, [ ]   Blood in urine  PSYCHIATRIC:  [ ]  history of major depression  INTEGUMENTARY:  [ ]  rashes, [ ]  ulcers  CONSTITUTIONAL:  [ ]  fever, [ ]  chills  Physical Examination  Filed Vitals:   04/07/12 1237  BP: 139/78  Pulse: 53   Resp: 18  Height: 5\' 11"  (1.803 m)  Weight: 238 lb (107.956 kg)  SpO2: 98%   Body mass index is 33.21 kg/(m^2).  General: A&O x 3, WD, obese  Head: [place]/AT  Ear/Nose/Throat: Hearing grossly intact, nares w/o erythema or drainage, oropharynx w/o Erythema/Exudate  Eyes: PERRLA, EOMI  Neck: Supple, no nuchal rigidity, no palpable LAD  Pulmonary: Sym exp, good air movt, CTAB, no rales, rhonchi, & wheezing  Cardiac: RRR, Nl S1, S2, no Murmurs, rubs or gallops  Vascular: Vessel Right Left  Radial Palpable Palpable  Ulnar Palpable Palpable  Brachial Palpable Palpable  Carotid Palpable, without bruit Palpable, without bruit  Aorta Not palpable N/A  Femoral Palpable Palpable  Popliteal Not palpable Not palpable  PT Palpable Palpable  DP Faintly Palpable Faintly Palpable   Gastrointestinal: soft, NTND, -G/R, - HSM, - masses, - CVAT B  Musculoskeletal: M/S 5/5 throughout , Extremities without ischemic changes , intermittent cyanotic feet which change with motor use, few varicosities bilaterally with scatter spider veins, possible healed ulcer on L 4th toe  Neurologic: CN 2-12 intact , Pain and light touch intact in extremities except both feet with some what decreased sensation,  Motor exam as listed above  Psychiatric: Judgment intact, Mood & affect appropriatefor pt's clinical situation  Dermatologic: See M/S exam for extremity exam, no rashes otherwise noted  Lymph : No Cervical, Axillary, or Inguinal lymphadenopathy   Non-Invasive Vascular Imaging  OSH ABI (Date: 03/17/12)  RLE: 1.15, DP and IO:[id number], TBI 0.19  LLE:  1.07, DP and LK:[id number], TBI 0.41  Outside Studies/Documentation 5 pages of outside documents were reviewed including: podiatry chart and outpatient ABI and physiologic studies.  Medical Decision Making  [name] is a 77 y.o. 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  The patient's feet also demonstrate likely chronic venous insufficiency changes in the feet.  I am ordering BLE venous insufficiency duplex studies to verify this.  I doubt this patient has Raynaud's syndrome, given it color changes with just motor use.  This patient also need to follow up with his PCP for evaluation for DM.  He has an elevated glc  to 148 on his CMP from last year.  His neuropathic sx are likely from DM.  I suspect his HgA1c will be elevated.  I discussed in depth with the patient the nature of atherosclerosis, and emphasized the importance of maximal medical management including strict control of blood pressure, blood glucose, and lipid levels, antiplatelet agent, obtaining regular exercise, and cessation of smoking.    The patient is aware that without maximal medical management the underlying atherosclerotic disease process will progress, limiting the benefit of any interventions.  The patient will follow up in 4 weeks with the following studies.    Thank you for allowing [place] to participate in this patient's care.  Leonides Sake, MD [facility] Office: [phone] Pager: [phone]  04/07/2012, 1:14 PM

## 2012-04-14 ENCOUNTER — Other Ambulatory Visit: Payer: Self-pay | Admitting: *Deleted

## 2012-04-14 ENCOUNTER — Other Ambulatory Visit: Payer: Self-pay

## 2012-04-14 DIAGNOSIS — I739 Peripheral vascular disease, unspecified: Secondary | ICD-10-CM

## 2012-04-14 DIAGNOSIS — L98499 Non-pressure chronic ulcer of skin of other sites with unspecified severity: Secondary | ICD-10-CM

## 2012-05-04 ENCOUNTER — Encounter: Payer: Self-pay | Admitting: Vascular Surgery

## 2012-05-05 ENCOUNTER — Encounter (INDEPENDENT_AMBULATORY_CARE_PROVIDER_SITE_OTHER): Payer: Medicare Other | Admitting: Vascular Surgery

## 2012-05-05 ENCOUNTER — Ambulatory Visit (INDEPENDENT_AMBULATORY_CARE_PROVIDER_SITE_OTHER): Payer: Medicare Other | Admitting: Vascular Surgery

## 2012-05-05 ENCOUNTER — Encounter: Payer: Self-pay | Admitting: Vascular Surgery

## 2012-05-05 VITALS — BP 154/85 | HR 71 | Ht 71.0 in | Wt 237.2 lb

## 2012-05-05 DIAGNOSIS — I83893 Varicose veins of bilateral lower extremities with other complications: Secondary | ICD-10-CM

## 2012-05-05 DIAGNOSIS — I739 Peripheral vascular disease, unspecified: Secondary | ICD-10-CM

## 2012-05-05 DIAGNOSIS — I872 Venous insufficiency (chronic) (peripheral): Secondary | ICD-10-CM | POA: Insufficient documentation

## 2012-05-05 DIAGNOSIS — L98499 Non-pressure chronic ulcer of skin of other sites with unspecified severity: Secondary | ICD-10-CM

## 2012-05-05 NOTE — Progress Notes (Signed)
[facility]    History of Present Illness  [name] is a 77 y.o. (1935/06/17) male who presents with chief complaint: bilateral foot pain.  The patient's symptoms have not progressed.  The patient's symptoms are: unchanged from his prior visit: cold feet, bluish hue.   The patient's treatment regimen currently included: maximal medical management.  The patient is not a smoker.  He returns today for vascular lab studies.  Past Medical History, Past Surgical History, Social History, Family History, Medications, Allergies, and Review of Systems are unchanged from previous evaluation on 04/07/12.  Physical Examination  Filed Vitals:   05/05/12 1048  BP: 154/85  Pulse: 71  Height: 5\' 11"  (1.803 m)  Weight: 237 lb 3.2 oz (107.593 kg)  SpO2: 95%   Body mass index is 33.1 kg/(m^2).  General: A&O x 3, WD ,obese  Pulmonary: Sym exp, good air movt, CTAB, no rales, rhonchi, & wheezing  Cardiac: RRR, Nl S1, S2, no Murmurs, rubs or gallops  Vascular: Vessel Right Left  Radial Palpable Palpable  Ulnar Palpable Palpable  Brachial Palpable Palpable  Carotid Palpable, without bruit Palpable, without bruit  Aorta Not palpable N/A  Femoral Palpable Palpable  Popliteal Not palpable Not palpable  PT Palpable Palpable  DP Palpable Palpable   Musculoskeletal: M/S 5/5 throughout , Extremities without ischemic changes   Neurologic: Pain and light touch intact in extremities , Motor exam as listed above  Non-Invasive Vascular Imaging Exercise ABI (Date: 05/05/12)  RLE: 1.16, PT and DP: triphasic, 1.17 with exercise, TBI 0.35  LLE: 1.12, PT and DP: triphasic, 1.17 with exercise, TBI 0.43  BLE Venous Reflux (Date: 05/05/12)  R: no superficial or deep reflux  L: no superficial or deep reflux  Medical Decision Making  [name] is a 77 y.o. 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  Management is primarily medical in nature.  Nitropaste and calcium channel blockers can be helpful at times with sx locally.    Good foot care will be important to minimize the amount of blood needed by the feet as no revascularization option is likely to have any lasting benefit.    The limited degree of CVI in this patient does not need more that OTC compression stocking if they become sx, which they are not at this point.  It is not clear to me how much of the cyanosis in this patient is small vessel disease vs. Venous disease.  I discussed in depth with the patient the nature of atherosclerosis, and emphasized the importance of maximal medical management including strict control of blood pressure, blood glucose, and lipid levels, antiplatelet agents, obtaining regular exercise, and cessation of smoking.  The patient is aware that without maximal medical management the underlying atherosclerotic disease process will progress, limiting the benefit of any interventions.  Thank you for allowing [place] to participate in this patient's care.  Leonides Sake, MD [facility] Office: [phone] Pager: [phone]  05/05/2012, 11:03 AM

## 2012-05-05 NOTE — Progress Notes (Unsigned)
Ankle brachial iindex with exercise performed @ [facility] 05/05/2012

## 2012-06-05 ENCOUNTER — Other Ambulatory Visit: Payer: Self-pay | Admitting: Neurology

## 2012-06-19 ENCOUNTER — Encounter: Payer: Self-pay | Admitting: Nurse Practitioner

## 2012-06-21 ENCOUNTER — Encounter: Payer: Self-pay | Admitting: *Deleted

## 2012-06-22 ENCOUNTER — Encounter: Payer: Self-pay | Admitting: Nurse Practitioner

## 2012-06-22 ENCOUNTER — Encounter: Payer: Self-pay | Admitting: Gastroenterology

## 2012-06-22 ENCOUNTER — Ambulatory Visit (INDEPENDENT_AMBULATORY_CARE_PROVIDER_SITE_OTHER): Payer: Medicare Other | Admitting: Nurse Practitioner

## 2012-06-22 VITALS — BP 110/74 | HR 64 | Ht 70.5 in | Wt 234.4 lb

## 2012-06-22 DIAGNOSIS — Z1211 Encounter for screening for malignant neoplasm of colon: Secondary | ICD-10-CM

## 2012-06-22 DIAGNOSIS — D689 Coagulation defect, unspecified: Secondary | ICD-10-CM

## 2012-06-22 MED ORDER — MOVIPREP 100 G PO SOLR: 1.0000 | Freq: Once | ORAL | Status: DC

## 2012-06-22 NOTE — Patient Instructions (Addendum)
We sent the prescription by fax to [facility] for the colonoscopy prep.  We have given you a coupon for $10.00 off.   We will contact Dr. Imogene Burn regarding the Plavix and will call you once we hear from him. You have been scheduled for a colonoscopy with propofol. Please follow written instructions given to you at your visit today.  Please pick up your prep kit at the pharmacy within the next 1-3 days. If you use inhalers (even only as needed), please bring them with you on the day of your procedure. Your physician has requested that you go to www.startemmi.com and enter the access code given to you at your visit today. This web site gives a general overview about your procedure. However, you should still follow specific instructions given to you by our office regarding your preparation for the procedure.

## 2012-06-22 NOTE — Progress Notes (Signed)
HPI :  Patient is a 77 year old male known to Dr. Russella Dar. He had a upper endoscopy in 2004 for evaluation of reflux and dysphasia. No stricture was seen, he was dilated empirically. Only finding was that of a hiatal hernia. No recurrent swallowing problems . He had a colonoscopy April 2004 for evaluation of bowel changes .multiple polyps (hyperplastic) removed.   Patient is here today to discuss  Surveillance colonoscopy. He is on Plavix for peripheral artery disease and is followed by vascular. Patient denies any bowel changes or blood in stool.   Past Medical History  Diagnosis Date  . Endocarditis, valve unspecified, unspecified cause   . Unspecified sleep apnea   . Unspecified essential hypertension   . Physical deconditioning   . H/O mitral valve repair      postoperative ring with postoperative atrial fibrillation  . Coronary artery disease excluded   . Hiatal hernia 06/13/2002   Family History  Problem Relation Age of Onset  . Colon cancer Neg Hx   . Stroke Brother   . Stroke Sister   . Diabetes Mellitus II Brother   . Diabetes Mellitus II Sister   . Lung cancer Brother    History  Substance Use Topics  . Smoking status: Former Smoker -- 3.00 packs/day for 35 years    Types: Cigarettes    Quit date: 02/16/1988  . Smokeless tobacco: Never Used     Comment:  Year Quit: 1990   . Alcohol Use: No   Current Outpatient Prescriptions  Medication Sig Dispense Refill  . amLODipine (NORVASC) 2.5 MG tablet Take 25 mg by mouth daily.      . AVODART 0.5 MG capsule Take 1 capsule by mouth Daily.      . clopidogrel (PLAVIX) 75 MG tablet Take 75 mg by mouth daily.      . fish oil-omega-3 fatty acids 1000 MG capsule Take 1 g by mouth daily.        Marland Kitchen lisinopril (PRINIVIL,ZESTRIL) 10 MG tablet Take 10 mg by mouth daily.        . Multiple Vitamin (MULTIVITAMIN) tablet Take 1 tablet by mouth daily.        . nortriptyline (PAMELOR) 10 MG capsule Take 1 capsule (10 mg total) by mouth at  bedtime.  90 capsule  1  . omeprazole (PRILOSEC) 20 MG capsule Take 20 mg by mouth daily.         No current facility-administered medications for this visit.   No Known Allergies  Review of Systems: All systems reviewed and negative except where noted in HPI.   Physical Exam: BP 110/74  Pulse 64  Ht 5' 10.5" (1.791 m)  Wt 234 lb 6.4 oz (106.323 kg)  BMI 33.15 kg/m2 Constitutional: [place],well-developed, white male in no acute distress. HEENT: Normocephalic and atraumatic. Conjunctivae are normal. No scleral icterus. Neck supple.  Cardiovascular: Normal rate, regular rhythm.  Pulmonary/chest: Effort normal and breath sounds normal. No wheezing, rales or rhonchi. Abdominal: Soft, nondistended, nontender. Bowel sounds active throughout. There are no masses palpable. No hepatomegaly. Extremities: no edema Lymphadenopathy: No cervical adenopathy noted. Neurological: Alert and oriented to person place and time. Skin: Skin is warm and dry. No rashes noted. Psychiatric: Normal mood and affect. Behavior is normal.   ASSESSMENT AND PLAN: Colon cancer screening. Patient is due for his 10 year screening. Will contact vascular surgery to see if Plavix can be held prior to procedure. he risks, benefits, and alternatives to colonoscopy with possible biopsy and possible polypectomy  were discussed with the patient and he consents to proceed.

## 2012-06-22 NOTE — Progress Notes (Signed)
Reviewed and agree with management plan.  Mamadou Breon T. Anuar Walgren, MD FACG 

## 2012-06-29 ENCOUNTER — Telehealth: Payer: Self-pay | Admitting: Gastroenterology

## 2012-06-29 ENCOUNTER — Other Ambulatory Visit: Payer: Self-pay | Admitting: *Deleted

## 2012-07-03 NOTE — Telephone Encounter (Signed)
Called the pt and spoke to him.  I advised him Dr. Leonides Sake said the patient can hold the Plavix as needed. Our recommendation is 5 days and Dr. Imogene Burn agreed. I told Angelito to stop the Plavix on 07-13-2012 and resume it the day after the procedure, ( date of colonscopy is 07-18-2012  ). The pt understood the instructions.

## 2012-07-18 ENCOUNTER — Encounter: Payer: Self-pay | Admitting: Gastroenterology

## 2012-07-18 ENCOUNTER — Ambulatory Visit (AMBULATORY_SURGERY_CENTER): Payer: Medicare Other | Admitting: Gastroenterology

## 2012-07-18 ENCOUNTER — Encounter: Payer: Self-pay | Admitting: Neurology

## 2012-07-18 VITALS — BP 141/66 | HR 48 | Temp 98.2°F | Resp 14 | Ht 70.5 in | Wt 234.0 lb

## 2012-07-18 DIAGNOSIS — D126 Benign neoplasm of colon, unspecified: Secondary | ICD-10-CM

## 2012-07-18 DIAGNOSIS — R5383 Other fatigue: Secondary | ICD-10-CM

## 2012-07-18 DIAGNOSIS — R209 Unspecified disturbances of skin sensation: Secondary | ICD-10-CM

## 2012-07-18 DIAGNOSIS — Z1211 Encounter for screening for malignant neoplasm of colon: Secondary | ICD-10-CM

## 2012-07-18 MED ORDER — SODIUM CHLORIDE 0.9 % IV SOLN: 500.0000 mL | INTRAVENOUS | Status: DC

## 2012-07-18 NOTE — Progress Notes (Signed)
Called to room to assist during endoscopic procedure.  Patient ID and intended procedure confirmed with present staff. Received instructions for my participation in the procedure from the performing physician.  

## 2012-07-18 NOTE — Op Note (Signed)
[facility] [address] [place], [address]   COLONOSCOPY PROCEDURE REPORT  PATIENT: [name], [name]  MR#: [id number] BIRTHDATE: 05-30-1935 , 76  yrs. old GENDER: Male ENDOSCOPIST: Meryl Dare, MD, [facility] PROCEDURE DATE:  07/18/2012 PROCEDURE:   Colonoscopy with snare polypectomy ASA CLASS:   Class II INDICATIONS:average risk screening and last colonoscopy performed 10 years ago. MEDICATIONS: MAC sedation, administered by CRNA and propofol (Diprivan) 200mg  IV DESCRIPTION OF PROCEDURE:   After the risks benefits and alternatives of the procedure were thoroughly explained, informed consent was obtained.  A digital rectal exam revealed no abnormalities of the rectum.   The LB [id number]  endoscope was introduced through the anus and advanced to the cecum, which was identified by both the appendix and ileocecal valve. No adverse events experienced.   The quality of the prep was good, using MoviPrep  The instrument was then slowly withdrawn as the colon was fully examined.  COLON FINDINGS: A sessile polyp measuring 5 mm in size was found in the sigmoid colon.  A polypectomy was performed with a cold snare. The resection was complete and the polyp tissue was completely retrieved.   Mild diverticulosis was noted.   The colon was otherwise normal.  There was no diverticulosis, inflammation, polyps or cancers unless previously stated.  Retroflexed views revealed small internal hemorrhoids. The time to cecum=1 minutes 37 seconds.  Withdrawal time=11 minutes 28 seconds.  The scope was withdrawn and the procedure completed.  COMPLICATIONS: There were no complications.  ENDOSCOPIC IMPRESSION: 1.   Sessile polyp measuring 5 mm in the sigmoid colon; polypectomy performed with a cold snare 2.   Mild diverticulosis was noted 3.   Small internal hemorrhoids  RECOMMENDATIONS: 1.  Await pathology results 2.  Repeat colonoscopy in 5 years if polyp adenomatous;  otherwise no plans for screening colonoscopy due to age 27.  High fiber diet with liberal fluid intake.  eSigned:  Meryl Dare, MD, Clementeen Graham 07/18/2012 2:17 PM   cc: Joette Catching, MD

## 2012-07-18 NOTE — Patient Instructions (Addendum)

## 2012-07-18 NOTE — Progress Notes (Signed)
Patient did not experience any of the following events: a burn prior to discharge; a fall within the facility; wrong site/side/patient/procedure/implant event; or a hospital transfer or hospital admission upon discharge from the facility. (G8907) Patient did not have preoperative order for IV antibiotic SSI prophylaxis. (G8918)  

## 2012-07-19 ENCOUNTER — Telehealth: Payer: Self-pay

## 2012-07-19 ENCOUNTER — Ambulatory Visit (INDEPENDENT_AMBULATORY_CARE_PROVIDER_SITE_OTHER): Payer: Medicare Other | Admitting: Neurology

## 2012-07-19 ENCOUNTER — Encounter: Payer: Self-pay | Admitting: Neurology

## 2012-07-19 VITALS — BP 135/78 | HR 48 | Ht 71.0 in | Wt 229.0 lb

## 2012-07-19 DIAGNOSIS — R29898 Other symptoms and signs involving the musculoskeletal system: Secondary | ICD-10-CM

## 2012-07-19 HISTORY — DX: Other symptoms and signs involving the musculoskeletal system: R29.898

## 2012-07-19 NOTE — Telephone Encounter (Signed)
Left message on answering machine. 

## 2012-07-19 NOTE — Progress Notes (Signed)
Reason for visit: Episodic arm weakness  [name] is an 77 y.o. male  History of present illness:  [name] is a 77 year old right-handed white male with a history of episodes of bilateral arm heaviness and weakness. The episodes have been coming off and on for 4 or 5 years. The patient had been doing well the last several months, but in the beginning in May of 2014, the patient began having episodes again, lasting only a few moments. The patient has had about 6 episodes since May, with the last episode occurring about 2 weeks ago. The patient does not feel palpitations for heart with these events. The patient consistently is running heart rates in the 40s. The patient has full recovery after the events. The patient has not lost consciousness. The patient returns for an evaluation.  Past Medical History  Diagnosis Date  . Endocarditis, valve unspecified, unspecified cause   . Unspecified sleep apnea     no cpap  . Unspecified essential hypertension   . Physical deconditioning   . H/O mitral valve repair      postoperative ring with postoperative atrial fibrillation  . Coronary artery disease excluded   . Hiatal hernia 06/13/2002  . Allergy   . GERD (gastroesophageal reflux disease)   . Heart murmur   . Weakness of both arms 07/19/2012  . BPH (benign prostatic hyperplasia)     Past Surgical History  Procedure Laterality Date  . Cholecystectomy    . Mv repair    . Vasectomy    . Rotator cuff repair      left  . Knee surgery Left     Family History  Problem Relation Age of Onset  . Colon cancer Neg Hx   . Esophageal cancer Neg Hx   . Rectal cancer Neg Hx   . Stomach cancer Neg Hx   . Stroke Brother   . Stroke Sister   . Diabetes Mellitus II Brother   . Diabetes Mellitus II Sister   . Lung cancer Brother   . Congestive Heart Failure Father   . Heart disease Father     Social history:  reports that he quit smoking about 24 years ago. His smoking use included  Cigarettes. He has a 105 pack-year smoking history. He has never used smokeless tobacco. He reports that he does not drink alcohol or use illicit drugs.  Allergies: No Known Allergies  Medications:  Current Outpatient Prescriptions on File Prior to Visit  Medication Sig Dispense Refill  . amLODipine (NORVASC) 2.5 MG tablet Take 2.5 mg by mouth daily.       . AVODART 0.5 MG capsule Take 1 capsule by mouth Daily.      . clopidogrel (PLAVIX) 75 MG tablet Take 75 mg by mouth daily.      . fish oil-omega-3 fatty acids 1000 MG capsule Take 1 g by mouth daily.        Marland Kitchen lisinopril (PRINIVIL,ZESTRIL) 10 MG tablet Take 10 mg by mouth daily.        . Multiple Vitamin (MULTIVITAMIN) tablet Take 1 tablet by mouth daily.        . nortriptyline (PAMELOR) 10 MG capsule Take 1 capsule (10 mg total) by mouth at bedtime.  90 capsule  1  . omeprazole (PRILOSEC) 20 MG capsule Take 20 mg by mouth daily.         No current facility-administered medications on file prior to visit.    ROS:  Out of a complete  14 system review of symptoms, the patient complains only of the following symptoms, and all other reviewed systems are negative.  Episodic arm weakness  Blood pressure 135/78, pulse 48, height 5\' 11"  (1.803 m), weight 229 lb (103.874 kg).  Physical Exam  General: The patient is alert and cooperative at the time of the examination. The patient is moderately obese.  Skin: No significant peripheral edema is noted.   Neurologic Exam  Cranial nerves: Facial symmetry is present. Speech is normal, no aphasia or dysarthria is noted. Extraocular movements are full. Visual fields are full.  Motor: The patient has good strength in all 4 extremities.  Coordination: The patient has good finger-nose-finger and heel-to-shin bilaterally.  Gait and station: The patient has a normal gait. Tandem gait is normal. Romberg is negative. No drift is seen.  Reflexes: Deep tendon reflexes are  symmetric.   Assessment/Plan:  1. Episodic arm weakness  Neurologic evaluation has been unremarkable that has included an EEG study, MRI the brain, MRA of the head, and MRI of the cervical spine. The patient could potentially be having drops in his heart rate into the 30s, and he may be becoming symptomatic at that time. The patient may require a prolonged cardiac monitor to catch an episode. The patient did have a CardioNet monitor previously, but he did not have an episode of arm weakness during the monitoring period. The patient will followup to this office as needed. The patient will be seen through cardiology in the near future.   Marlan Palau MD 07/19/2012 8:34 PM  [facility] [address] [place], [place] [address]  Phone [phone] Fax [phone]

## 2012-07-30 ENCOUNTER — Encounter: Payer: Self-pay | Admitting: Gastroenterology

## 2012-09-28 ENCOUNTER — Encounter: Payer: Self-pay | Admitting: Cardiology

## 2012-09-28 ENCOUNTER — Ambulatory Visit (INDEPENDENT_AMBULATORY_CARE_PROVIDER_SITE_OTHER): Payer: Medicare Other | Admitting: Cardiology

## 2012-09-28 VITALS — BP 144/84 | HR 48 | Ht 71.0 in | Wt 230.8 lb

## 2012-09-28 DIAGNOSIS — R42 Dizziness and giddiness: Secondary | ICD-10-CM

## 2012-09-28 DIAGNOSIS — I4891 Unspecified atrial fibrillation: Secondary | ICD-10-CM

## 2012-09-28 NOTE — Patient Instructions (Addendum)
Your physician recommends that you schedule a follow-up appointment in: 1 year. You will receive a reminder letter in the mail in about 10 months reminding you to call and schedule your appointment. If you don't receive this letter, please contact our office. Your physician recommends that you continue on your current medications as directed. Please refer to the Current Medication list given to you today. Your physician has recommended that you wear an event monitor for 21 days. Event monitors are medical devices that record the heart's electrical activity. Doctors most often [place] these monitors to diagnose arrhythmias. Arrhythmias are problems with the speed or rhythm of the heartbeat. The monitor is a small, portable device. You can wear one while you do your normal daily activities. This is usually used to diagnose what is causing palpitations/syncope (passing out). [employer] will contact you about this monitor. Your physician has requested that you have an echocardiogram in May 2015. Echocardiography is a painless test that uses sound waves to create images of your heart. It provides your doctor with information about the size and shape of your heart and how well your heart's chambers and valves are working. This procedure takes approximately one hour. There are no restrictions for this procedure.

## 2012-09-28 NOTE — Progress Notes (Signed)
HPI  The patient presents for evaluation of an episode of a history of mitral valve repair. He is also had a history of episodic arm weakness and dizziness that was described in my previous note. He has had a neuro evaluation and I reviewed these records. No clear etiology is identified though it was suggested the possibility that this could be related to his bradycardia. He has had mitral valve repair. The last echo in May of 2013 demonstrated some mild regurgitation but was otherwise stable. He denies any acute cardiovascular symptoms. The patient denies any new symptoms such as chest discomfort, neck or arm discomfort. There has been no new shortness of breath, PND or orthopnea. There have been no reported palpitations, presyncope or syncope.  He does golf and exercises a couple of times a week at the gym.  No Known Allergies  Current Outpatient Prescriptions  Medication Sig Dispense Refill  . amLODipine (NORVASC) 2.5 MG tablet Take 2.5 mg by mouth daily.       . AVODART 0.5 MG capsule Take 1 capsule by mouth Daily.      . clopidogrel (PLAVIX) 75 MG tablet Take 75 mg by mouth daily.      Marland Kitchen lisinopril (PRINIVIL,ZESTRIL) 10 MG tablet Take 10 mg by mouth daily.        . Multiple Vitamin (MULTIVITAMIN) tablet Take 1 tablet by mouth daily.        . nortriptyline (PAMELOR) 10 MG capsule Take 1 capsule (10 mg total) by mouth at bedtime.  90 capsule  1  . Omega-3 Fatty Acids (FISH OIL) 1200 MG CAPS Take 1,200 mg by mouth daily.      Marland Kitchen omeprazole (PRILOSEC) 20 MG capsule Take 20 mg by mouth daily.         No current facility-administered medications for this visit.    Past Medical History  Diagnosis Date  . Endocarditis, valve unspecified, unspecified cause   . Unspecified sleep apnea     no cpap  . Unspecified essential hypertension   . Physical deconditioning   . H/O mitral valve repair      postoperative ring with postoperative atrial fibrillation  . Coronary artery disease excluded   .  Hiatal hernia 06/13/2002  . Allergy   . GERD (gastroesophageal reflux disease)   . Heart murmur   . Weakness of both arms 07/19/2012  . BPH (benign prostatic hyperplasia)     Past Surgical History  Procedure Laterality Date  . Cholecystectomy    . Mv repair      2003  . Vasectomy    . Rotator cuff repair Left   . Knee surgery Left     ROS:  Numbness on the bottom of his feet. Otherwise stated in the HPI and negative for all other systems.  PHYSICAL EXAM BP 144/84  Pulse 48  Ht 5\' 11"  (1.803 m)  Wt 230 lb 12.8 oz (104.69 kg)  BMI 32.2 kg/m2 GENERAL:  Well appearing HEENT:  Pupils equal round and reactive, fundi not visualized, oral mucosa unremarkable NECK:  No jugular venous distention, waveform within normal limits, carotid upstroke brisk and symmetric, no bruits, no thyromegaly LYMPHATICS:  No cervical, inguinal adenopathy LUNGS:  Clear to auscultation bilaterally BACK:  No CVA tenderness CHEST:  Well healed left thoracotomy scar HEART:  PMI not displaced or sustained,S1 and S2 within normal limits, no S3, no S4, no clicks, no rubs, no murmurs ABD:  Flat, positive bowel sounds normal in frequency in pitch, no bruits,  no rebound, no guarding, no midline pulsatile mass, no hepatomegaly, no splenomegaly, obese EXT:  2 plus pulses throughout, no edema, no cyanosis no clubbing SKIN:  No rashes no nodules NEURO:  Cranial nerves II through XII grossly intact, motor grossly intact throughout PSYCH:  Cognitively intact, oriented to person place and time   EKG:  Sinus bradycardia, rate 48, axis within normal limits, intervals within normal limits, RSR prime V1 and V2, no acute ST-T wave changes. 09/28/2012   ASSESSMENT AND PLAN  MITRAL VALVE REPAIR:  This was stable last year. I will repeat an echocardiogram in May of 2015.  DIZZINESS:  On the outside chance that this is related to his bradycardia I will get an event monitor. She will need a 21 day event monitor.  The patients  symptoms necessitate an event monitor.  The symptoms are too infrequent to be identified on a holter monitor.    CAROTID STENOSIS:  This is followed by Dr. Imogene Burn  HTN:  The blood pressure is at target. No change in medications is indicated. We will continue with therapeutic lifestyle changes (TLC).

## 2012-10-17 ENCOUNTER — Ambulatory Visit: Payer: Medicare Other | Admitting: Cardiovascular Disease

## 2012-11-06 ENCOUNTER — Telehealth: Payer: Self-pay | Admitting: *Deleted

## 2012-11-06 NOTE — Telephone Encounter (Signed)
Wife calling to inquire about heart monitor results.  Informed patient that results had been sent to Dr. Antoine Poche for his review (inter-office sent to [place] on 10/24/2012).  Will notify as soon as we have available.  Wife verbalized understanding.

## 2012-11-07 NOTE — Telephone Encounter (Signed)
I still don't think that I have seen these strips.

## 2012-11-09 ENCOUNTER — Telehealth: Payer: Self-pay | Admitting: Cardiology

## 2012-11-09 NOTE — Telephone Encounter (Signed)
Spoke with wife - aware Dr Antoine Poche has the monitor results and pt will be called with them as soon as he gets them back to me.  She states understanding.

## 2012-11-09 NOTE — Telephone Encounter (Signed)
New Problem  Pt request results from wearing the heart monitor for 3 weeks.

## 2012-11-14 NOTE — Telephone Encounter (Signed)
Please see new phone note for response.

## 2012-11-16 ENCOUNTER — Telehealth: Payer: Self-pay | Admitting: Cardiology

## 2012-11-16 NOTE — Telephone Encounter (Signed)
Called pt with results - Sinus [name] rare ectopy

## 2012-11-16 NOTE — Telephone Encounter (Signed)
Follow up  Pt calling for results from wearing the monitor for 3 weeks.

## 2012-12-21 ENCOUNTER — Other Ambulatory Visit: Payer: Self-pay

## 2013-07-24 ENCOUNTER — Ambulatory Visit (HOSPITAL_COMMUNITY): Payer: Medicare Other | Attending: Family Medicine | Admitting: Radiology

## 2013-07-24 ENCOUNTER — Other Ambulatory Visit (HOSPITAL_COMMUNITY): Payer: Self-pay | Admitting: Radiology

## 2013-07-24 DIAGNOSIS — R011 Cardiac murmur, unspecified: Secondary | ICD-10-CM

## 2013-07-24 DIAGNOSIS — I059 Rheumatic mitral valve disease, unspecified: Secondary | ICD-10-CM

## 2013-07-24 NOTE — Progress Notes (Signed)
Echocardiogram performed.  

## 2013-07-31 ENCOUNTER — Encounter: Payer: Self-pay | Admitting: Cardiology

## 2013-09-19 ENCOUNTER — Telehealth: Payer: Self-pay

## 2013-09-19 ENCOUNTER — Encounter: Payer: Self-pay | Admitting: Gastroenterology

## 2013-09-19 ENCOUNTER — Ambulatory Visit (INDEPENDENT_AMBULATORY_CARE_PROVIDER_SITE_OTHER): Payer: Medicare Other | Admitting: Gastroenterology

## 2013-09-19 VITALS — BP 120/64 | HR 56 | Ht 70.0 in | Wt 236.2 lb

## 2013-09-19 DIAGNOSIS — R1319 Other dysphagia: Secondary | ICD-10-CM

## 2013-09-19 DIAGNOSIS — K219 Gastro-esophageal reflux disease without esophagitis: Secondary | ICD-10-CM

## 2013-09-19 MED ORDER — PEG-KCL-NACL-NASULF-NA ASC-C 100 G PO SOLR: 1.0000 | Freq: Once | ORAL | Status: DC

## 2013-09-19 NOTE — Progress Notes (Signed)
    History of Present Illness: This is a 78 year old male accompanied by his wife complaining of intermittent dysphagia mainly to solids and bread. His symptoms have been infrequent for about 2 years however over the past several months are occurring more frequently, about 2 times per months. He had one episode where he had dysphagia when swallowing water. He has chronic GERD and came off omeprazole about one year ago and has had no reflux symptoms. PCP recently restarted omeprazole 20 mg twice daily. He underwent upper endoscopy in 2004 for dysphagia and there was no stricture noted, an empiric dilation was performed.   Current Medications, Allergies, Past Medical History, Past Surgical History, Family History and Social History were reviewed in [employer] record.  Physical Exam: General: Well developed , well nourished, no acute distress Head: Normocephalic and atraumatic Eyes:  sclerae anicteric, EOMI Ears: Normal auditory acuity Mouth: No deformity or lesions Lungs: Clear throughout to auscultation Heart: Regular rate and rhythm; no murmurs, rubs or bruits Abdomen: Soft, non tender and non distended. No masses, hepatosplenomegaly or hernias noted. Normal Bowel sounds Musculoskeletal: Symmetrical with no gross deformities  Pulses:  Normal pulses noted Extremities: No clubbing, cyanosis, edema or deformities noted Neurological: Alert oriented x 4, grossly nonfocal Psychological:  Alert and cooperative. Normal mood and affect  Assessment and Recommendations:  1. Dysphagia, primarily to solids and on one occasion with water. Rule out esophageal motility disorder, rule out esophageal stricture. Continue omeprazole twice daily. Standard antireflux measures. Schedule barium esophagram with tablet. Schedule upper endoscopy with possible dilation off Plavix for 5 days. Obtain clearance from his PCP. The risks, benefits, and alternatives to endoscopy with possible biopsy and  possible dilation were discussed with the patient and they consent to proceed.

## 2013-09-19 NOTE — Telephone Encounter (Signed)
  09/19/2013   RE: MADEX SEALS DOB: January 07, 1936 MRN: [id number]   Dear Dr. Edrick Oh,    We have scheduled the above patient for an endoscopic procedure. Our records show that he is on anticoagulation therapy.   Please advise as to how long the patient may come off his therapy of Plavix prior to the procedure, which is scheduled for 10/29/13.  Please fax back/ or route the completed form to [place] at [phone].   Sincerely,    Marlon Pel, CMA

## 2013-09-19 NOTE — Patient Instructions (Signed)
You have been scheduled for a Barium Esophogram at [facility] (1st floor of the hospital) on 09/20/13 at 9:30am. Please arrive 15 minutes prior to your appointment for registration. Make certain not to have anything to eat or drink 6 hours prior to your test. If you need to reschedule for any reason, please contact radiology at [phone] to do so. __________________________________________________________________ A barium swallow is an examination that concentrates on views of the esophagus. This tends to be a double contrast exam (barium and two liquids which, when combined, create a gas to distend the wall of the oesophagus) or single contrast (non-ionic iodine based). The study is usually tailored to your symptoms so a good history is essential. Attention is paid during the study to the form, structure and configuration of the esophagus, looking for functional disorders (such as aspiration, dysphagia, achalasia, motility and reflux) EXAMINATION You may be asked to change into a gown, depending on the type of swallow being performed. A radiologist and radiographer will perform the procedure. The radiologist will advise you of the type of contrast selected for your procedure and direct you during the exam. You will be asked to stand, sit or lie in several different positions and to hold a small amount of fluid in your mouth before being asked to swallow while the imaging is performed .In some instances you may be asked to swallow barium coated marshmallows to assess the motility of a solid food bolus. The exam can be recorded as a digital or video fluoroscopy procedure. POST PROCEDURE It will take 1-2 days for the barium to pass through your system. To facilitate this, it is important, unless otherwise directed, to increase your fluids for the next 24-48hrs and to resume your normal diet.  This test typically takes about 30 minutes to  perform. __________________________________________________________________________________   [name] have been scheduled for an endoscopy. Please follow written instructions given to you at your visit today. If you use inhalers (even only as needed), please bring them with you on the day of your procedure. Your physician has requested that you go to www.startemmi.com and enter the access code given to you at your visit today. This web site gives a general overview about your procedure. However, you should still follow specific instructions given to you by our office regarding your preparation for the procedure.   cc: Dione Housekeeper, MD

## 2013-09-20 ENCOUNTER — Ambulatory Visit (HOSPITAL_COMMUNITY): Admission: RE | Admit: 2013-09-20 | Payer: Medicare Other | Source: Ambulatory Visit

## 2013-09-24 ENCOUNTER — Ambulatory Visit (HOSPITAL_COMMUNITY)
Admission: RE | Admit: 2013-09-24 | Discharge: 2013-09-24 | Disposition: A | Discharge status: Home / Self Care | Payer: Medicare Other | Source: Ambulatory Visit | Attending: Gastroenterology | Admitting: Gastroenterology

## 2013-09-24 DIAGNOSIS — K219 Gastro-esophageal reflux disease without esophagitis: Secondary | ICD-10-CM

## 2013-09-24 DIAGNOSIS — R1319 Other dysphagia: Secondary | ICD-10-CM | POA: Diagnosis present

## 2013-09-24 DIAGNOSIS — K228 Other specified diseases of esophagus: Secondary | ICD-10-CM | POA: Insufficient documentation

## 2013-09-24 DIAGNOSIS — K2289 Other specified disease of esophagus: Secondary | ICD-10-CM | POA: Diagnosis not present

## 2013-09-25 ENCOUNTER — Encounter: Payer: Self-pay | Admitting: Cardiology

## 2013-09-25 ENCOUNTER — Ambulatory Visit (INDEPENDENT_AMBULATORY_CARE_PROVIDER_SITE_OTHER): Payer: Medicare Other | Admitting: Cardiology

## 2013-09-25 VITALS — BP 152/90 | HR 48 | Ht 70.0 in | Wt 236.0 lb

## 2013-09-25 DIAGNOSIS — I658 Occlusion and stenosis of other precerebral arteries: Secondary | ICD-10-CM

## 2013-09-25 DIAGNOSIS — I4891 Unspecified atrial fibrillation: Secondary | ICD-10-CM

## 2013-09-25 DIAGNOSIS — I6529 Occlusion and stenosis of unspecified carotid artery: Secondary | ICD-10-CM

## 2013-09-25 DIAGNOSIS — I480 Paroxysmal atrial fibrillation: Secondary | ICD-10-CM

## 2013-09-25 DIAGNOSIS — I6523 Occlusion and stenosis of bilateral carotid arteries: Secondary | ICD-10-CM

## 2013-09-25 NOTE — Patient Instructions (Signed)
Your physician recommends that you schedule a follow-up appointment in: one year with Dr. Hochrein  

## 2013-09-25 NOTE — Progress Notes (Signed)
HPI  The patient presents for evaluation of an episode of a history of mitral valve repair.  Since last being seen he has had no new cardiovascular complaints. In particular he's not describing the dizziness that he was having.  He does exercise a couple of days per week.  The patient denies any new symptoms such as chest discomfort, neck or arm discomfort. There has been no new shortness of breath, PND or orthopnea. There have been no reported palpitations, presyncope or syncope.  He has been evaluated for possible PVD.  However, Dr. Bridgett Larsson did not suggest that he needed any follow up for a lower extremity disease. I do note that he's had some carotid stenosis and was now scheduled for followup of this. He was placed on Plavix by a foot doctor. I reviewed Dr. Lianne Moris note and he thought that an antiplatelet agent was reasonable.   No Known Allergies  Current Outpatient Prescriptions  Medication Sig Dispense Refill  . amLODipine (NORVASC) 2.5 MG tablet Take 2.5 mg by mouth daily.       . clopidogrel (PLAVIX) 75 MG tablet Take 75 mg by mouth daily.      Marland Kitchen lisinopril (PRINIVIL,ZESTRIL) 10 MG tablet Take 10 mg by mouth daily.        . Multiple Vitamin (MULTIVITAMIN) tablet Take 1 tablet by mouth daily.        Marland Kitchen omeprazole (PRILOSEC) 20 MG capsule Take 20 mg by mouth 2 (two) times daily before a meal.        No current facility-administered medications for this visit.    Past Medical History  Diagnosis Date  . Endocarditis, valve unspecified, unspecified cause   . Unspecified essential hypertension   . H/O mitral valve repair     Postoperative ring with postoperative atrial fibrillation  . Coronary artery disease excluded   . Hiatal hernia 06/13/2002  . Allergy   . GERD (gastroesophageal reflux disease)   . Weakness of both arms 07/19/2012  . BPH (benign prostatic hyperplasia)   . Diverticulosis   . Hiatal hernia     Past Surgical History  Procedure Laterality Date  . Cholecystectomy      . Mv repair      2003  . Vasectomy    . Rotator cuff repair Left   . Knee surgery Left     ROS:  Numbness on the bottom of his feet. Otherwise stated in the HPI and negative for all other systems.  PHYSICAL EXAM BP 152/90  Pulse 48  Ht 5\' 10"  (1.778 m)  Wt 236 lb (107.049 kg)  BMI 33.86 kg/m2 GENERAL:  Well appearing HEENT:  Pupils equal round and reactive, fundi not visualized, oral mucosa unremarkable NECK:  No jugular venous distention, waveform within normal limits, carotid upstroke brisk and symmetric, no bruits, no thyromegaly LYMPHATICS:  No cervical, inguinal adenopathy LUNGS:  Clear to auscultation bilaterally BACK:  No CVA tenderness CHEST:  Well healed left thoracotomy scar HEART:  PMI not displaced or sustained,S1 and S2 within normal limits, no S3, no S4, no clicks, no rubs, no murmurs ABD:  Flat, positive bowel sounds normal in frequency in pitch, no bruits, no rebound, no guarding, no midline pulsatile mass, no hepatomegaly, no splenomegaly, obese EXT:  2 plus pulses throughout, no edema, no cyanosis no clubbing SKIN:  No rashes no nodules NEURO:  Cranial nerves II through XII grossly intact, motor grossly intact throughout PSYCH:  Cognitively intact, oriented to person place and time   EKG:  Sinus bradycardia, rate 46, axis within normal limits, intervals within normal limits, RSR prime V1 and V2, no acute ST-T wave changes. 09/25/2013   ASSESSMENT AND PLAN  MITRAL VALVE REPAIR:  This was stable on echo in May. No change in therapy or further imaging is indicated.   DIZZINESS:  He is not complaining of this.  He wore a monitor last fall with bradycardia but no other significant arrhythmias and no symptoms related to this. Continued followup is indicated or change in therapy is indicated.  CAROTID STENOSIS:  He has had bilateral 50% stenosis with the last check in 2013 I will followup on this.  HTN:  The blood pressure is slightly elevated. However, this is  unusual.. No change in medications is indicated. We will continue with therapeutic lifestyle changes (TLC).  We did discuss weight loss.

## 2013-09-26 ENCOUNTER — Encounter: Payer: Self-pay | Admitting: Gastroenterology

## 2013-10-04 ENCOUNTER — Telehealth: Payer: Self-pay | Admitting: *Deleted

## 2013-10-04 ENCOUNTER — Ambulatory Visit (HOSPITAL_COMMUNITY)
Admission: RE | Admit: 2013-10-04 | Discharge: 2013-10-04 | Disposition: A | Discharge status: Home / Self Care | Payer: Medicare Other | Source: Ambulatory Visit | Attending: Cardiology | Admitting: Cardiology

## 2013-10-04 DIAGNOSIS — I6523 Occlusion and stenosis of bilateral carotid arteries: Secondary | ICD-10-CM

## 2013-10-04 DIAGNOSIS — I6529 Occlusion and stenosis of unspecified carotid artery: Secondary | ICD-10-CM

## 2013-10-04 DIAGNOSIS — I658 Occlusion and stenosis of other precerebral arteries: Secondary | ICD-10-CM | POA: Insufficient documentation

## 2013-10-04 NOTE — Progress Notes (Signed)
Carotid Duplex Completed. °Brianna L Mazza,RVT °

## 2013-10-04 NOTE — Telephone Encounter (Signed)
Dr. Edrick Oh called and wanted to speak with you about this pt. And about stopping his Plavix for a procedure

## 2013-10-05 ENCOUNTER — Telehealth: Payer: Self-pay | Admitting: Pediatrics

## 2013-10-05 ENCOUNTER — Telehealth: Payer: Self-pay | Admitting: Cardiology

## 2013-10-05 NOTE — Telephone Encounter (Signed)
Wrong doctor °

## 2013-10-05 NOTE — Telephone Encounter (Signed)
Dr. Percival Spanish has talked with Dr. Edrick Oh about this pt.

## 2013-10-06 NOTE — Telephone Encounter (Signed)
OK to hold Plavix for 5 days prior to the procedure.

## 2013-10-07 NOTE — Telephone Encounter (Signed)
See Hochrein message re Plavix

## 2013-10-08 NOTE — Telephone Encounter (Signed)
See phone note from 10/05/13 and informed patient of Dr. Rosezella Florida and Dr. Murrell Redden decision. Pt verbalized understanding.

## 2013-10-10 ENCOUNTER — Encounter: Payer: Self-pay | Admitting: Cardiology

## 2013-10-29 ENCOUNTER — Encounter: Payer: Self-pay | Admitting: Gastroenterology

## 2013-10-29 ENCOUNTER — Ambulatory Visit (AMBULATORY_SURGERY_CENTER): Payer: Medicare Other | Admitting: Gastroenterology

## 2013-10-29 VITALS — BP 101/50 | HR 46 | Temp 97.3°F | Resp 33 | Ht 70.0 in | Wt 236.0 lb

## 2013-10-29 DIAGNOSIS — R1319 Other dysphagia: Secondary | ICD-10-CM

## 2013-10-29 DIAGNOSIS — K219 Gastro-esophageal reflux disease without esophagitis: Secondary | ICD-10-CM

## 2013-10-29 MED ORDER — SODIUM CHLORIDE 0.9 % IV SOLN: 500.0000 mL | INTRAVENOUS | Status: DC

## 2013-10-29 NOTE — Op Note (Signed)
[place]  [address] [place], [id number]   ENDOSCOPY PROCEDURE REPORT  PATIENT: [name], [name]  MR#: [id number] BIRTHDATE: [birth year] , 78  yrs. old GENDER: Male ENDOSCOPIST: Ladene Artist, MD, [facility] PROCEDURE DATE:  10/29/2013 PROCEDURE:   EGD with dilatation over guidewire ASA CLASS:   Class III INDICATIONS:dysphagia and GERD. MEDICATIONS: MAC sedation, administered by CRNA, propofol (Diprivan) 150mg  IV, and Robinul 0.2 mg IV TOPICAL ANESTHETIC:   none DESCRIPTION OF PROCEDURE:   After the risks benefits and alternatives of the procedure were thoroughly explained, informed consent was obtained.  The LB [id number]  endoscope was introduced through the mouth  and advanced to the descending duodenum ,      The instrument was slowly withdrawn as the mucosa was carefully examined.    ESOPHAGUS: The mucosa of the esophagus appeared normal. STOMACH: The mucosa and folds of the stomach appeared normal. DUODENUM: The duodenal mucosa showed no abnormalities. Dilation was then performed at the distal esophagus for dysphagia without a stricture. Dilator:Savary over guidewire  Reststance:none Heme:none   COMPLICATIONS: There were no complications.  ENDOSCOPIC IMPRESSION: 1.   The EGD appeared normal  RECOMMENDATIONS: 1.  anti-reflux regimen 2.  continue PPI bid 3.  post dilation instructions 4.  consider esophageal manometry if dysphagia persists  eSigned:  Ladene Artist, MD, [facility] 10/29/2013 2:33 PM   OI:NOMVEHM Edrick Oh, MD

## 2013-10-29 NOTE — Progress Notes (Signed)
Procedure ends, to recovery, report given and VSS. 

## 2013-10-29 NOTE — Progress Notes (Signed)
Called to room to assist during endoscopic procedure.  Patient ID and intended procedure confirmed with present staff. Received instructions for my participation in the procedure from the performing physician.  

## 2013-10-29 NOTE — Patient Instructions (Signed)
Discharge instructions given with verbal understanding. Handout on a dilatation diet. Resume previous medications. YOU HAD AN ENDOSCOPIC PROCEDURE TODAY AT [facility]: Refer to the procedure report that was given to you for any specific questions about what was found during the examination.  If the procedure report does not answer your questions, please call your gastroenterologist to clarify.  If you requested that your care partner not be given the details of your procedure findings, then the procedure report has been included in a sealed envelope for you to review at your convenience later.  YOU SHOULD EXPECT: Some feelings of bloating in the abdomen. Passage of more gas than usual.  Walking can help get rid of the air that was put into your GI tract during the procedure and reduce the bloating. If you had a lower endoscopy (such as a colonoscopy or flexible sigmoidoscopy) you may notice spotting of blood in your stool or on the toilet paper. If you underwent a bowel prep for your procedure, then you may not have a normal bowel movement for a few days.  DIET: Your first meal following the procedure should be a light meal and then it is ok to progress to your normal diet.  A half-sandwich or bowl of soup is an example of a good first meal.  Heavy or fried foods are harder to digest and may make you feel nauseous or bloated.  Likewise meals heavy in dairy and vegetables can cause extra gas to form and this can also increase the bloating.  Drink plenty of fluids but you should avoid alcoholic beverages for 24 hours.  ACTIVITY: Your care partner should take you home directly after the procedure.  You should plan to take it easy, moving slowly for the rest of the day.  You can resume normal activity the day after the procedure however you should NOT DRIVE or use heavy machinery for 24 hours (because of the sedation medicines used during the test).    SYMPTOMS TO REPORT IMMEDIATELY: A  gastroenterologist can be reached at any hour.  During normal business hours, 8:30 AM to 5:00 PM Monday through Friday, call [phone].  After hours and on weekends, please call the GI answering service at [phone] who will take a message and have the physician on call contact you.   Following upper endoscopy (EGD)  Vomiting of blood or coffee ground material  New chest pain or pain under the shoulder blades  Painful or persistently difficult swallowing  New shortness of breath  Fever of 100F or higher  Black, tarry-looking stools  FOLLOW UP: If any biopsies were taken you will be contacted by phone or by letter within the next 1-3 weeks.  Call your gastroenterologist if you have not heard about the biopsies in 3 weeks.  Our staff will call the home number listed on your records the next business day following your procedure to check on you and address any questions or concerns that you may have at that time regarding the information given to you following your procedure. This is a courtesy call and so if there is no answer at the home number and we have not heard from you through the emergency physician on call, we will assume that you have returned to your regular daily activities without incident.  SIGNATURES/CONFIDENTIALITY: You and/or your care partner have signed paperwork which will be entered into your electronic medical record.  These signatures attest to the fact that that the information above  on your After Visit Summary has been reviewed and is understood.  Full responsibility of the confidentiality of this discharge information lies with you and/or your care-partner.

## 2013-10-30 ENCOUNTER — Telehealth: Payer: Self-pay | Admitting: *Deleted

## 2013-10-30 NOTE — Telephone Encounter (Signed)
  Follow up Call-  Call back number 10/29/2013 07/18/2012  Post procedure Call Back phone  # [phone]  Permission to leave phone message Yes Yes     Patient questions:  Do you have a fever, pain , or abdominal swelling? No. Pain Score  0 *  Have you tolerated food without any problems? Yes.    Have you been able to return to your normal activities? Yes.    Do you have any questions about your discharge instructions: Diet   No. Medications  No. Follow up visit  No.  Do you have questions or concerns about your Care? No.  Actions: * If pain score is 4 or above: No action needed, pain <4.

## 2013-11-30 ENCOUNTER — Other Ambulatory Visit: Payer: Self-pay

## 2014-08-12 ENCOUNTER — Other Ambulatory Visit: Payer: Self-pay

## 2014-10-24 ENCOUNTER — Ambulatory Visit (INDEPENDENT_AMBULATORY_CARE_PROVIDER_SITE_OTHER): Payer: Medicare Other | Admitting: Cardiology

## 2014-10-24 ENCOUNTER — Encounter: Payer: Self-pay | Admitting: Cardiology

## 2014-10-24 VITALS — BP 134/70 | HR 48 | Ht 71.0 in | Wt 237.7 lb

## 2014-10-24 DIAGNOSIS — I2510 Atherosclerotic heart disease of native coronary artery without angina pectoris: Secondary | ICD-10-CM | POA: Diagnosis not present

## 2014-10-24 DIAGNOSIS — I10 Essential (primary) hypertension: Secondary | ICD-10-CM
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
## 2014-10-24 NOTE — Patient Instructions (Signed)
Your physician recommends that you schedule a follow-up appointment in: [place] DR. HOCHREIN

## 2014-10-29 ENCOUNTER — Telehealth: Payer: Self-pay | Admitting: *Deleted

## 2014-10-29 MED ORDER — PRAVASTATIN SODIUM 20 MG PO TABS: 20.0000 mg | ORAL_TABLET | Freq: Every evening | ORAL | Status: DC

## 2014-10-29 NOTE — Telephone Encounter (Signed)
Spoke with pt wife letting her know Dr Percival Spanish want him to start Pravastatin 20mg  daily. #30 tablets, 6 refills was [facility] into [facility] [address] [place].

## 2014-11-01 ENCOUNTER — Encounter: Payer: Self-pay | Admitting: Cardiology

## 2015-01-13 ENCOUNTER — Encounter: Payer: Self-pay | Admitting: Gastroenterology

## 2015-05-06 NOTE — Patient Instructions (Signed)
Your procedure is scheduled on: 05/12/2015  Report to [facility] at   31  AM.  Call this number if you have problems the morning of surgery: [phone]   Do not eat food or drink liquids :After Midnight.      Take these medicines the morning of surgery with A SIP OF WATER: norvasc, lisinopril, prilosec.   Do not wear jewelry, make-up or nail polish.  Do not wear lotions, powders, or perfumes. You may wear deodorant.  Do not shave 48 hours prior to surgery.  Do not bring valuables to the hospital.  Contacts, dentures or bridgework may not be worn into surgery.  Leave suitcase in the car. After surgery it may be brought to your room.  For patients admitted to the hospital, checkout time is 11:00 AM the day of discharge.   Patients discharged the day of surgery will not be allowed to drive home.  :     Please read over the following fact sheets that you were given: Coughing and Deep Breathing, Surgical Site Infection Prevention, Anesthesia Post-op Instructions and Care and Recovery After Surgery    Cataract A cataract is a clouding of the lens of the eye. When a lens becomes cloudy, vision is reduced based on the degree and nature of the clouding. Many cataracts reduce vision to some degree. Some cataracts make people more near-sighted as they develop. Other cataracts increase glare. Cataracts that are ignored and become worse can sometimes look white. The white color can be seen through the pupil. CAUSES   Aging. However, cataracts may occur at any age, even in newborns.   Certain drugs.   Trauma to the eye.   Certain diseases such as diabetes.   Specific eye diseases such as chronic inflammation inside the eye or a sudden attack of a rare form of glaucoma.   Inherited or acquired medical problems.  SYMPTOMS   Gradual, progressive drop in vision in the affected eye.   Severe, rapid visual loss. This most often happens when trauma is the cause.  DIAGNOSIS  To detect a  cataract, an eye doctor examines the lens. Cataracts are best diagnosed with an exam of the eyes with the pupils enlarged (dilated) by drops.  TREATMENT  For an early cataract, vision may improve by using different eyeglasses or stronger lighting. If that does not help your vision, surgery is the only effective treatment. A cataract needs to be surgically removed when vision loss interferes with your everyday activities, such as driving, reading, or watching TV. A cataract may also have to be removed if it prevents examination or treatment of another eye problem. Surgery removes the cloudy lens and usually replaces it with a substitute lens (intraocular lens, IOL).  At a time when both you and your doctor agree, the cataract will be surgically removed. If you have cataracts in both eyes, only one is usually removed at a time. This allows the operated eye to heal and be out of danger from any possible problems after surgery (such as infection or poor wound healing). In rare cases, a cataract may be doing damage to your eye. In these cases, your caregiver may advise surgical removal right away. The vast majority of people who have cataract surgery have better vision afterward. HOME CARE INSTRUCTIONS  If you are not planning surgery, you may be asked to do the following:  Use different eyeglasses.   Use stronger or brighter lighting.   Ask your eye doctor about reducing your  medicine dose or changing medicines if it is thought that a medicine caused your cataract. Changing medicines does not make the cataract go away on its own.   Become familiar with your surroundings. Poor vision can lead to injury. Avoid bumping into things on the affected side. You are at a higher risk for tripping or falling.   Exercise extreme care when driving or operating machinery.   Wear sunglasses if you are sensitive to bright light or experiencing problems with glare.  SEEK IMMEDIATE MEDICAL CARE IF:   You have a  worsening or sudden vision loss.   You notice redness, swelling, or increasing pain in the eye.   You have a fever.  Document Released: 02/01/2005 Document Revised: 01/21/2011 Document Reviewed: 09/25/2010 [facility] Patient Information 2012 [place].PATIENT INSTRUCTIONS POST-ANESTHESIA  IMMEDIATELY FOLLOWING SURGERY:  Do not drive or operate machinery for the first twenty four hours after surgery.  Do not make any important decisions for twenty four hours after surgery or while taking narcotic pain medications or sedatives.  If you develop intractable nausea and vomiting or a severe headache please notify your doctor immediately.  FOLLOW-UP:  Please make an appointment with your surgeon as instructed. You do not need to follow up with anesthesia unless specifically instructed to do so.  WOUND CARE INSTRUCTIONS (if applicable):  Keep a dry clean dressing on the anesthesia/puncture wound site if there is drainage.  Once the wound has quit draining you may leave it open to air.  Generally you should leave the bandage intact for twenty four hours unless there is drainage.  If the epidural site drains for more than 36-48 hours please call the anesthesia department.  QUESTIONS?:  Please feel free to call your physician or the hospital operator if you have any questions, and they will be happy to assist you.

## 2015-05-07 ENCOUNTER — Encounter (HOSPITAL_COMMUNITY): Payer: Self-pay

## 2015-05-07 ENCOUNTER — Encounter (HOSPITAL_COMMUNITY)
Admission: RE | Admit: 2015-05-07 | Discharge: 2015-05-07 | Disposition: A | Discharge status: Home / Self Care | Payer: Medicare Other | Source: Ambulatory Visit | Attending: Ophthalmology | Admitting: Ophthalmology

## 2015-05-07 ENCOUNTER — Other Ambulatory Visit: Payer: Self-pay

## 2015-05-07 DIAGNOSIS — Z01812 Encounter for preprocedural laboratory examination: Secondary | ICD-10-CM | POA: Diagnosis present

## 2015-05-07 DIAGNOSIS — Z01810 Encounter for preprocedural cardiovascular examination: Secondary | ICD-10-CM | POA: Insufficient documentation

## 2015-05-07 LAB — CBC WITH DIFFERENTIAL/PLATELET
BASOS ABS: 0 10*3/uL (ref 0.0–0.1)
BASOS PCT: 0 %
Eosinophils Absolute: 0.2 10*3/uL (ref 0.0–0.7)
Eosinophils Relative: 2 %
HEMATOCRIT: 43.9 % (ref 39.0–52.0)
HEMOGLOBIN: 15.2 g/dL (ref 13.0–17.0)
Lymphocytes Relative: 29 %
Lymphs Abs: 2 10*3/uL (ref 0.7–4.0)
MCH: 32.3 pg (ref 26.0–34.0)
MCHC: 34.6 g/dL (ref 30.0–36.0)
MCV: 93.4 fL (ref 78.0–100.0)
Monocytes Absolute: 0.6 10*3/uL (ref 0.1–1.0)
Monocytes Relative: 9 %
NEUTROS ABS: 4.1 10*3/uL (ref 1.7–7.7)
NEUTROS PCT: 60 %
Platelets: 215 10*3/uL (ref 150–400)
RBC: 4.7 MIL/uL (ref 4.22–5.81)
RDW: 12.7 % (ref 11.5–15.5)
WBC: 6.9 10*3/uL (ref 4.0–10.5)

## 2015-05-07 LAB — BASIC METABOLIC PANEL
ANION GAP: 7 (ref 5–15)
BUN: 15 mg/dL (ref 6–20)
CALCIUM: 8.6 mg/dL — AB (ref 8.9–10.3)
CHLORIDE: 104 mmol/L (ref 101–111)
CO2: 29 mmol/L (ref 22–32)
Creatinine, Ser: 0.62 mg/dL (ref 0.61–1.24)
GFR calc non Af Amer: >60 mL/min (ref >60)
Glucose, Bld: 89 mg/dL (ref 65–99)
Potassium: 4.4 mmol/L (ref 3.5–5.1)
Sodium: 140 mmol/L (ref 135–145)

## 2015-05-12 ENCOUNTER — Encounter (HOSPITAL_COMMUNITY)
Admission: RE | Disposition: A | Discharge status: Home / Self Care | Payer: Self-pay | Source: Ambulatory Visit | Attending: Ophthalmology

## 2015-05-12 ENCOUNTER — Ambulatory Visit (HOSPITAL_COMMUNITY): Payer: Medicare Other | Admitting: Anesthesiology

## 2015-05-12 ENCOUNTER — Ambulatory Visit (HOSPITAL_COMMUNITY)
Admission: RE | Admit: 2015-05-12 | Discharge: 2015-05-12 | Disposition: A | Discharge status: Home / Self Care | Payer: Medicare Other | Source: Ambulatory Visit | Attending: Ophthalmology | Admitting: Ophthalmology

## 2015-05-12 ENCOUNTER — Encounter (HOSPITAL_COMMUNITY): Payer: Self-pay | Admitting: *Deleted

## 2015-05-12 DIAGNOSIS — Z79899 Other long term (current) drug therapy: Secondary | ICD-10-CM | POA: Insufficient documentation

## 2015-05-12 DIAGNOSIS — H2181 Floppy iris syndrome: Secondary | ICD-10-CM | POA: Insufficient documentation

## 2015-05-12 DIAGNOSIS — I10 Essential (primary) hypertension: Secondary | ICD-10-CM | POA: Diagnosis not present

## 2015-05-12 DIAGNOSIS — Z7902 Long term (current) use of antithrombotics/antiplatelets: Secondary | ICD-10-CM | POA: Diagnosis not present

## 2015-05-12 DIAGNOSIS — H2511 Age-related nuclear cataract, right eye: Secondary | ICD-10-CM | POA: Insufficient documentation

## 2015-05-12 DIAGNOSIS — Z87891 Personal history of nicotine dependence: Secondary | ICD-10-CM | POA: Diagnosis not present

## 2015-05-12 DIAGNOSIS — K219 Gastro-esophageal reflux disease without esophagitis: Secondary | ICD-10-CM | POA: Diagnosis not present

## 2015-05-12 DIAGNOSIS — R0681 Apnea, not elsewhere classified: Secondary | ICD-10-CM | POA: Diagnosis not present

## 2015-05-12 HISTORY — PX: CATARACT EXTRACTION W/PHACO: SHX586

## 2015-05-12 SURGERY — PHACOEMULSIFICATION, CATARACT, WITH IOL INSERTION
Anesthesia: Monitor Anesthesia Care | Site: Eye | Laterality: Right

## 2015-05-12 MED ORDER — LIDOCAINE HCL 3.5 % OP GEL
1.0000 "application " | Freq: Once | OPHTHALMIC | Status: AC
  Administered 2015-05-12: 1 via OPHTHALMIC

## 2015-05-12 MED ORDER — LACTATED RINGERS IV SOLN
INTRAVENOUS | Status: DC
  Administered 2015-05-12: 07:00:00 via INTRAVENOUS

## 2015-05-12 MED ORDER — LIDOCAINE HCL (PF) 1 % IJ SOLN
INTRAOCULAR | Status: DC | PRN
  Administered 2015-05-12: .9 mL via OPHTHALMIC

## 2015-05-12 MED ORDER — MIDAZOLAM HCL 2 MG/2ML IJ SOLN
1.0000 mg | INTRAMUSCULAR | Status: DC | PRN
  Administered 2015-05-12: 2 mg via INTRAVENOUS

## 2015-05-12 MED ORDER — GLYCOPYRROLATE 0.2 MG/ML IJ SOLN
INTRAMUSCULAR | Status: DC | PRN
  Administered 2015-05-12: 0.2 mg via INTRAVENOUS

## 2015-05-12 MED ORDER — MIDAZOLAM HCL 2 MG/2ML IJ SOLN
INTRAMUSCULAR | Status: AC
  Filled 2015-05-12: qty 2

## 2015-05-12 MED ORDER — EPINEPHRINE HCL 1 MG/ML IJ SOLN
INTRAOCULAR | Status: DC | PRN
  Administered 2015-05-12: 500 mL

## 2015-05-12 MED ORDER — FENTANYL CITRATE (PF) 100 MCG/2ML IJ SOLN
INTRAMUSCULAR | Status: AC
  Filled 2015-05-12: qty 2

## 2015-05-12 MED ORDER — PHENYLEPHRINE HCL 2.5 % OP SOLN
1.0000 [drp] | OPHTHALMIC | Status: AC
  Administered 2015-05-12 (×3): 1 [drp] via OPHTHALMIC

## 2015-05-12 MED ORDER — BSS IO SOLN
INTRAOCULAR | Status: DC | PRN
  Administered 2015-05-12: 15 mL

## 2015-05-12 MED ORDER — PROVISC 10 MG/ML IO SOLN
INTRAOCULAR | Status: DC | PRN
  Administered 2015-05-12: 0.85 mL via INTRAOCULAR

## 2015-05-12 MED ORDER — POVIDONE-IODINE 5 % OP SOLN
OPHTHALMIC | Status: DC | PRN
  Administered 2015-05-12: 1 via OPHTHALMIC

## 2015-05-12 MED ORDER — GLYCOPYRROLATE 0.2 MG/ML IJ SOLN
INTRAMUSCULAR | Status: AC
  Filled 2015-05-12: qty 1

## 2015-05-12 MED ORDER — FENTANYL CITRATE (PF) 100 MCG/2ML IJ SOLN
25.0000 ug | INTRAMUSCULAR | Status: AC
  Administered 2015-05-12: 25 ug via INTRAVENOUS

## 2015-05-12 MED ORDER — CYCLOPENTOLATE-PHENYLEPHRINE 0.2-1 % OP SOLN
1.0000 [drp] | OPHTHALMIC | Status: AC
  Administered 2015-05-12 (×3): 1 [drp] via OPHTHALMIC

## 2015-05-12 MED ORDER — NEOMYCIN-POLYMYXIN-DEXAMETH 3.5-10000-0.1 OP SUSP
OPHTHALMIC | Status: DC | PRN
  Administered 2015-05-12: 2 [drp] via OPHTHALMIC

## 2015-05-12 MED ORDER — LIDOCAINE 3.5 % OP GEL OPTIME - NO CHARGE
OPHTHALMIC | Status: DC | PRN
  Administered 2015-05-12: 2 [drp] via OPHTHALMIC

## 2015-05-12 MED ORDER — TETRACAINE HCL 0.5 % OP SOLN
1.0000 [drp] | OPHTHALMIC | Status: AC
  Administered 2015-05-12 (×3): 1 [drp] via OPHTHALMIC

## 2015-05-12 SURGICAL SUPPLY — 11 items
CLOTH BEACON ORANGE TIMEOUT ST (SAFETY) ×1 IMPLANT
EYE SHIELD UNIVERSAL CLEAR (GAUZE/BANDAGES/DRESSINGS) ×1 IMPLANT
GLOVE BIOGEL PI IND STRL 7.0 (GLOVE) IMPLANT
GLOVE BIOGEL PI INDICATOR 7.0 (GLOVE) ×2
PAD ARMBOARD 7.5X6 YLW CONV (MISCELLANEOUS) ×1 IMPLANT
RING MALYGIN (MISCELLANEOUS) ×1 IMPLANT
SIGHTPATH CAT PROC W REG LENS (Ophthalmic Related) ×2 IMPLANT
SYRINGE LUER LOK 1CC (MISCELLANEOUS) ×1 IMPLANT
TAPE SURG TRANSPORE 1 IN (GAUZE/BANDAGES/DRESSINGS) IMPLANT
TAPE SURGICAL TRANSPORE 1 IN (GAUZE/BANDAGES/DRESSINGS) ×1
WATER STERILE IRR 250ML POUR (IV SOLUTION) ×1 IMPLANT

## 2015-05-12 NOTE — Anesthesia Preprocedure Evaluation (Signed)
Anesthesia Evaluation  Patient identified by MRN, date of birth, ID band Patient awake    Reviewed: Allergy & Precautions, NPO status , Patient's Chart, lab work & pertinent test results  Airway Mallampati: III  TM Distance: >3 FB     Dental  (+) Partial Lower, Partial Upper   Pulmonary shortness of breath, sleep apnea , former smoker,    breath sounds clear to auscultation       Cardiovascular hypertension, Pt. on medications + Peripheral Vascular Disease  + dysrhythmias Atrial Fibrillation + Valvular Problems/Murmurs (s/p MVR) MR  Rhythm:Regular Rate:Bradycardia     Neuro/Psych    GI/Hepatic hiatal hernia, GERD  Medicated and Controlled,  Endo/Other    Renal/GU      Musculoskeletal   Abdominal   Peds  Hematology   Anesthesia Other Findings   Reproductive/Obstetrics                             Anesthesia Physical Anesthesia Plan  ASA: III  Anesthesia Plan: MAC   Post-op Pain Management:    Induction: Intravenous  Airway Management Planned: Simple Face Mask  Additional Equipment:   Intra-op Plan:   Post-operative Plan:   Informed Consent: I have reviewed the patients History and Physical, chart, labs and discussed the procedure including the risks, benefits and alternatives for the proposed anesthesia with the patient or authorized representative who has indicated his/her understanding and acceptance.     Plan Discussed with:   Anesthesia Plan Comments:         Anesthesia Quick Evaluation

## 2015-05-12 NOTE — Op Note (Addendum)
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  Assistants: None  Anesthesia: Topical with MAC  Indications: Painless, progressive loss of vision with compromise of daily activities.  Surgery: Cataract Extraction with Intraocular lens Implant Right Eye. CPT Code: [id number].  Discription: The patient had dilating drops and viscous lidocaine placed into the left eye in the pre-op holding area. After transfer to the operating room, a time out was performed. The patient was then prepped and draped. Beginning with a 55 degree blade a paracentesis port was made at the surgeon's 2 o'clock position. The anterior chamber was then filled with 1% non-preserved lidocaine with epinepherine. The pupil was approximately 4.59mm. This was followed by filling the anterior chamber with Provisc. A Malyugan ring was used to maintain pupil size. It was placed into the anterior chamber using its injector. The loops were positioned with the Kuglan hook. A bent cystatome needle was used to create a continuous tear capsulotomy. Hydrodissection was performed with balanced salt solution on a Fine canula. The lens nucleus was then removed using the phacoemulsification handpiece. Residual cortex was removed with the I&A handpiece. The anterior chamber and capsular bag were refilled with Provisc. A posterior chamber intraocular lens was placed into the capsular bag with it's injector. The implant was positioned with the Kuglan hook. The Malyugan ring was disengaged from the iris margin and removed with its injector. The Provisc was then removed from the anterior chamber and capsular bag with the I&A handpiece. Stromal hydration of the main incision and paracentesis port was performed with BSS on a Fine canula. The wounds were tested for leak which  was negative. The patient tolerated the procedure well. There were no operative complications. The patient was then transferred to the recovery room in stable condition.  Complications: None  Specimen: None  EBL: None  Prosthetic device: Hoya iSert, model 250, power 19.5, SN [id number].

## 2015-05-12 NOTE — Anesthesia Postprocedure Evaluation (Signed)
Anesthesia Post Note  Patient: [name]  Procedure(s) Performed: Procedure(s) (LRB): CATARACT EXTRACTION PHACO AND INTRAOCULAR LENS PLACEMENT RIGHT EYE CDE=7.75 (Right)  Patient location during evaluation: Short Stay Anesthesia Type: MAC Level of consciousness: awake and alert Pain management: satisfactory to patient Vital Signs Assessment: post-procedure vital signs reviewed and stable Respiratory status: spontaneous breathing Cardiovascular status: stable Anesthetic complications: no    Last Vitals:  Filed Vitals:   05/12/15 0637 05/12/15 0818  BP:  113/65  Pulse:  53  Temp: 36.3 C 36.9 C  Resp:  16    Last Pain: There were no vitals filed for this visit.               Drucie Opitz

## 2015-05-12 NOTE — Anesthesia Procedure Notes (Signed)
Procedure Name: MAC Date/Time: 05/12/2015 7:54 AM Performed by: Vista Deck Pre-anesthesia Checklist: Patient identified, Emergency Drugs available, Suction available, Timeout performed and Patient being monitored Patient Re-evaluated:Patient Re-evaluated prior to inductionOxygen Delivery Method: Nasal Cannula

## 2015-05-12 NOTE — Transfer of Care (Signed)
Immediate Anesthesia Transfer of Care Note  Patient: [name]  Procedure(s) Performed: Procedure(s) (LRB): CATARACT EXTRACTION PHACO AND INTRAOCULAR LENS PLACEMENT RIGHT EYE CDE=7.75 (Right)  Patient Location: Shortstay  Anesthesia Type: MAC  Level of Consciousness: awake  Airway & Oxygen Therapy: Patient Spontanous Breathing   Post-op Assessment: Report given to PACU RN, Post -op Vital signs reviewed and stable and Patient moving all extremities  Post vital signs: Reviewed and stable  Complications: No apparent anesthesia complications

## 2015-05-12 NOTE — Discharge Instructions (Signed)
PATIENT INSTRUCTIONS POST-ANESTHESIA  IMMEDIATELY FOLLOWING SURGERY:  Do not drive or operate machinery for the first twenty four hours after surgery.  Do not make any important decisions for twenty four hours after surgery or while taking narcotic pain medications or sedatives.  If you develop intractable nausea and vomiting or a severe headache please notify your doctor immediately.  FOLLOW-UP:  Please make an appointment with your surgeon as instructed. You do not need to follow up with anesthesia unless specifically instructed to do so.  WOUND CARE INSTRUCTIONS (if applicable):  Keep a dry clean dressing on the anesthesia/puncture wound site if there is drainage.  Once the wound has quit draining you may leave it open to air.  Generally you should leave the bandage intact for twenty four hours unless there is drainage.  If the epidural site drains for more than 36-48 hours please call the anesthesia department.  QUESTIONS?:  Please feel free to call your physician or the hospital operator if you have any questions, and they will be happy to assist you.      PATIENT INSTRUCTIONS POST-ANESTHESIA  IMMEDIATELY FOLLOWING SURGERY:  Do not drive or operate machinery for the first twenty four hours after surgery.  Do not make any important decisions for twenty four hours after surgery or while taking narcotic pain medications or sedatives.  If you develop intractable nausea and vomiting or a severe headache please notify your doctor immediately.  FOLLOW-UP:  Please make an appointment with your surgeon as instructed. You do not need to follow up with anesthesia unless specifically instructed to do so.  WOUND CARE INSTRUCTIONS (if applicable):  Keep a dry clean dressing on the anesthesia/puncture wound site if there is drainage.  Once the wound has quit draining you may leave it open to air.  Generally you should leave the bandage intact for twenty four hours unless there is drainage.  If the epidural  site drains for more than 36-48 hours please call the anesthesia department.  QUESTIONS?:  Please feel free to call your physician or the hospital operator if you have any questions, and they will be happy to assist you.

## 2015-05-12 NOTE — H&P (Signed)
I have reviewed the H&P, the patient was re-examined, and I have identified no interval changes in medical condition and plan of care since the history and physical of record  

## 2015-05-13 ENCOUNTER — Encounter (HOSPITAL_COMMUNITY): Payer: Self-pay | Admitting: Ophthalmology

## 2015-05-25 ENCOUNTER — Other Ambulatory Visit: Payer: Self-pay | Admitting: Cardiology

## 2015-05-26 NOTE — Telephone Encounter (Signed)
REFILL 

## 2015-06-09 ENCOUNTER — Encounter (HOSPITAL_COMMUNITY)
Admission: RE | Admit: 2015-06-09 | Discharge: 2015-06-09 | Disposition: A | Discharge status: Home / Self Care | Payer: Medicare Other | Source: Ambulatory Visit | Attending: Ophthalmology | Admitting: Ophthalmology

## 2015-06-09 ENCOUNTER — Encounter (HOSPITAL_COMMUNITY): Payer: Self-pay

## 2015-06-11 MED ORDER — NEOMYCIN-POLYMYXIN-DEXAMETH 3.5-10000-0.1 OP SUSP
OPHTHALMIC | Status: AC
  Filled 2015-06-11: qty 5

## 2015-06-11 MED ORDER — LIDOCAINE HCL (PF) 1 % IJ SOLN
INTRAMUSCULAR | Status: AC
  Filled 2015-06-11: qty 2

## 2015-06-11 MED ORDER — TETRACAINE HCL 0.5 % OP SOLN
OPHTHALMIC | Status: AC
  Filled 2015-06-11: qty 4

## 2015-06-11 MED ORDER — PHENYLEPHRINE HCL 2.5 % OP SOLN
OPHTHALMIC | Status: AC
  Filled 2015-06-11: qty 15

## 2015-06-11 MED ORDER — LIDOCAINE HCL 3.5 % OP GEL
OPHTHALMIC | Status: AC
  Filled 2015-06-11: qty 1

## 2015-06-11 MED ORDER — CYCLOPENTOLATE-PHENYLEPHRINE OP SOLN OPTIME - NO CHARGE
OPHTHALMIC | Status: AC
  Filled 2015-06-11: qty 2

## 2015-06-12 ENCOUNTER — Encounter (HOSPITAL_COMMUNITY): Payer: Self-pay | Admitting: Ophthalmology

## 2015-06-12 ENCOUNTER — Ambulatory Visit (HOSPITAL_COMMUNITY): Payer: Medicare Other | Admitting: Anesthesiology

## 2015-06-12 ENCOUNTER — Ambulatory Visit (HOSPITAL_COMMUNITY)
Admission: RE | Admit: 2015-06-12 | Discharge: 2015-06-12 | Disposition: A | Discharge status: Home / Self Care | Payer: Medicare Other | Source: Ambulatory Visit | Attending: Ophthalmology | Admitting: Ophthalmology

## 2015-06-12 ENCOUNTER — Encounter (HOSPITAL_COMMUNITY)
Admission: RE | Disposition: A | Discharge status: Home / Self Care | Payer: Self-pay | Source: Ambulatory Visit | Attending: Ophthalmology

## 2015-06-12 DIAGNOSIS — Z87891 Personal history of nicotine dependence: Secondary | ICD-10-CM | POA: Insufficient documentation

## 2015-06-12 DIAGNOSIS — Z79899 Other long term (current) drug therapy: Secondary | ICD-10-CM | POA: Diagnosis not present

## 2015-06-12 DIAGNOSIS — K219 Gastro-esophageal reflux disease without esophagitis: Secondary | ICD-10-CM | POA: Diagnosis not present

## 2015-06-12 DIAGNOSIS — H2181 Floppy iris syndrome: Secondary | ICD-10-CM | POA: Insufficient documentation

## 2015-06-12 DIAGNOSIS — Z7902 Long term (current) use of antithrombotics/antiplatelets: Secondary | ICD-10-CM | POA: Diagnosis not present

## 2015-06-12 DIAGNOSIS — I10 Essential (primary) hypertension: Secondary | ICD-10-CM | POA: Insufficient documentation

## 2015-06-12 DIAGNOSIS — G4730 Sleep apnea, unspecified: Secondary | ICD-10-CM | POA: Insufficient documentation

## 2015-06-12 DIAGNOSIS — I4892 Unspecified atrial flutter: Secondary | ICD-10-CM | POA: Insufficient documentation

## 2015-06-12 DIAGNOSIS — H2512 Age-related nuclear cataract, left eye: Secondary | ICD-10-CM | POA: Insufficient documentation

## 2015-06-12 HISTORY — PX: CATARACT EXTRACTION W/PHACO: SHX586

## 2015-06-12 SURGERY — PHACOEMULSIFICATION, CATARACT, WITH IOL INSERTION
Anesthesia: Monitor Anesthesia Care | Site: Eye | Laterality: Left

## 2015-06-12 MED ORDER — GLYCOPYRROLATE 0.2 MG/ML IJ SOLN
INTRAMUSCULAR | Status: AC
  Filled 2015-06-12: qty 1

## 2015-06-12 MED ORDER — CYCLOPENTOLATE-PHENYLEPHRINE 0.2-1 % OP SOLN
1.0000 [drp] | OPHTHALMIC | Status: AC
  Administered 2015-06-12 (×3): 1 [drp] via OPHTHALMIC

## 2015-06-12 MED ORDER — LIDOCAINE 3.5 % OP GEL OPTIME - NO CHARGE
OPHTHALMIC | Status: DC | PRN
  Administered 2015-06-12: 1 [drp] via OPHTHALMIC

## 2015-06-12 MED ORDER — FENTANYL CITRATE (PF) 100 MCG/2ML IJ SOLN
25.0000 ug | INTRAMUSCULAR | Status: AC
Start: 2015-06-12 — End: 2015-06-12
  Administered 2015-06-12 (×2): 25 ug via INTRAVENOUS
  Filled 2015-06-12: qty 2

## 2015-06-12 MED ORDER — MIDAZOLAM HCL 2 MG/2ML IJ SOLN
1.0000 mg | INTRAMUSCULAR | Status: DC | PRN
Start: 2015-06-12 — End: 2015-06-12
  Administered 2015-06-12: 2 mg via INTRAVENOUS
  Filled 2015-06-12: qty 2

## 2015-06-12 MED ORDER — GLYCOPYRROLATE 0.2 MG/ML IJ SOLN
INTRAMUSCULAR | Status: DC | PRN
  Administered 2015-06-12: 0.2 mg via INTRAVENOUS

## 2015-06-12 MED ORDER — TETRACAINE HCL 0.5 % OP SOLN
1.0000 [drp] | OPHTHALMIC | Status: AC
  Administered 2015-06-12 (×3): 1 [drp] via OPHTHALMIC

## 2015-06-12 MED ORDER — EPINEPHRINE HCL 1 MG/ML IJ SOLN
INTRAMUSCULAR | Status: AC
  Filled 2015-06-12: qty 1

## 2015-06-12 MED ORDER — POVIDONE-IODINE 5 % OP SOLN
OPHTHALMIC | Status: DC | PRN
  Administered 2015-06-12: 1 via OPHTHALMIC

## 2015-06-12 MED ORDER — PHENYLEPHRINE HCL 2.5 % OP SOLN
1.0000 [drp] | OPHTHALMIC | Status: AC
  Administered 2015-06-12 (×3): 1 [drp] via OPHTHALMIC

## 2015-06-12 MED ORDER — LACTATED RINGERS IV SOLN
INTRAVENOUS | Status: DC
  Administered 2015-06-12: 08:00:00 via INTRAVENOUS

## 2015-06-12 MED ORDER — LIDOCAINE HCL 3.5 % OP GEL
1.0000 "application " | Freq: Once | OPHTHALMIC | Status: AC
  Administered 2015-06-12: 1 via OPHTHALMIC

## 2015-06-12 MED ORDER — LIDOCAINE HCL (PF) 1 % IJ SOLN
INTRAOCULAR | Status: DC | PRN
  Administered 2015-06-12: .9 mL via OPHTHALMIC

## 2015-06-12 MED ORDER — EPINEPHRINE HCL 1 MG/ML IJ SOLN
INTRAOCULAR | Status: DC | PRN
  Administered 2015-06-12: 500 mL

## 2015-06-12 MED ORDER — BSS IO SOLN
INTRAOCULAR | Status: DC | PRN
  Administered 2015-06-12: 15 mL via INTRAOCULAR

## 2015-06-12 MED ORDER — NEOMYCIN-POLYMYXIN-DEXAMETH 3.5-10000-0.1 OP SUSP
OPHTHALMIC | Status: DC | PRN
  Administered 2015-06-12: 2 [drp] via OPHTHALMIC

## 2015-06-12 MED ORDER — PROVISC 10 MG/ML IO SOLN
INTRAOCULAR | Status: DC | PRN
  Administered 2015-06-12: 0.85 mL via INTRAOCULAR

## 2015-06-12 SURGICAL SUPPLY — 23 items
CAPSULAR TENSION RING-AMO (OPHTHALMIC RELATED) IMPLANT
CLOTH BEACON ORANGE TIMEOUT ST (SAFETY) ×2 IMPLANT
EYE SHIELD UNIVERSAL CLEAR (GAUZE/BANDAGES/DRESSINGS) ×2 IMPLANT
GLOVE BIOGEL PI IND STRL 7.0 (GLOVE) IMPLANT
GLOVE BIOGEL PI IND STRL 7.5 (GLOVE) IMPLANT
GLOVE BIOGEL PI INDICATOR 7.0 (GLOVE) ×2
GLOVE BIOGEL PI INDICATOR 7.5 (GLOVE)
GLOVE EXAM NITRILE LRG STRL (GLOVE) IMPLANT
GLOVE EXAM NITRILE MD LF STRL (GLOVE) ×2 IMPLANT
KIT VITRECTOMY (OPHTHALMIC RELATED) IMPLANT
PAD ARMBOARD 7.5X6 YLW CONV (MISCELLANEOUS) ×2 IMPLANT
PROC W NO LENS (INTRAOCULAR LENS)
PROC W SPEC LENS (INTRAOCULAR LENS)
PROCESS W NO LENS (INTRAOCULAR LENS) IMPLANT
PROCESS W SPEC LENS (INTRAOCULAR LENS) IMPLANT
RETRACTOR IRIS SIGHTPATH (OPHTHALMIC RELATED) IMPLANT
RING MALYGIN (MISCELLANEOUS) IMPLANT
SIGHTPATH CAT PROC W REG LENS (Ophthalmic Related) ×3 IMPLANT
SYRINGE LUER LOK 1CC (MISCELLANEOUS) ×2 IMPLANT
TAPE SURG TRANSPORE 1 IN (GAUZE/BANDAGES/DRESSINGS) IMPLANT
TAPE SURGICAL TRANSPORE 1 IN (GAUZE/BANDAGES/DRESSINGS) ×2
VISCOELASTIC ADDITIONAL (OPHTHALMIC RELATED) IMPLANT
WATER STERILE IRR 250ML POUR (IV SOLUTION) ×2 IMPLANT

## 2015-06-12 NOTE — H&P (Signed)
I have reviewed the H&P, the patient was re-examined, and I have identified no interval changes in medical condition and plan of care since the history and physical of record  

## 2015-06-12 NOTE — Anesthesia Preprocedure Evaluation (Signed)
Anesthesia Evaluation  Patient identified by MRN, date of birth, ID band Patient awake    Reviewed: Allergy & Precautions, NPO status , Patient's Chart, lab work & pertinent test results  Airway Mallampati: III  TM Distance: >3 FB     Dental  (+) Partial Lower, Partial Upper   Pulmonary shortness of breath, sleep apnea , former smoker,    breath sounds clear to auscultation       Cardiovascular hypertension, Pt. on medications + Peripheral Vascular Disease  + dysrhythmias Atrial Fibrillation + Valvular Problems/Murmurs (s/p MVR) MR  Rhythm:Regular Rate:Bradycardia     Neuro/Psych    GI/Hepatic hiatal hernia, GERD  Medicated and Controlled,  Endo/Other    Renal/GU      Musculoskeletal   Abdominal   Peds  Hematology   Anesthesia Other Findings   Reproductive/Obstetrics                             Anesthesia Physical Anesthesia Plan  ASA: III  Anesthesia Plan: MAC   Post-op Pain Management:    Induction: Intravenous  Airway Management Planned: Simple Face Mask  Additional Equipment:   Intra-op Plan:   Post-operative Plan:   Informed Consent: I have reviewed the patients History and Physical, chart, labs and discussed the procedure including the risks, benefits and alternatives for the proposed anesthesia with the patient or authorized representative who has indicated his/her understanding and acceptance.     Plan Discussed with:   Anesthesia Plan Comments:         Anesthesia Quick Evaluation

## 2015-06-12 NOTE — Transfer of Care (Signed)
Immediate Anesthesia Transfer of Care Note  Patient: [name]  Procedure(s) Performed: Procedure(s) with comments: CATARACT EXTRACTION PHACO AND INTRAOCULAR LENS PLACEMENT (IOC) (Left) - CDE:  13.17  Patient Location: Short Stay  Anesthesia Type:MAC  Level of Consciousness: awake, alert , oriented and patient cooperative  Airway & Oxygen Therapy: Patient Spontanous Breathing  Post-op Assessment: Report given to RN, Post -op Vital signs reviewed and stable and Patient moving all extremities  Post vital signs: Reviewed and stable  Last Vitals:  Filed Vitals:   06/12/15 0815 06/12/15 0820  BP: 112/62 116/67  Pulse:    Temp:    Resp: 15 18    Last Pain: There were no vitals filed for this visit.    Patients Stated Pain Goal: 4 ([id number])  Complications: No apparent anesthesia complications

## 2015-06-12 NOTE — Anesthesia Postprocedure Evaluation (Signed)
Anesthesia Post Note  Patient: [name]  Procedure(s) Performed: Procedure(s) (LRB): CATARACT EXTRACTION PHACO AND INTRAOCULAR LENS PLACEMENT (IOC) (Left)  Patient location during evaluation: Short Stay Anesthesia Type: MAC Level of consciousness: awake and alert, oriented and patient cooperative Pain management: pain level controlled Vital Signs Assessment: post-procedure vital signs reviewed and stable Respiratory status: spontaneous breathing, nonlabored ventilation and respiratory function stable Cardiovascular status: blood pressure returned to baseline Postop Assessment: no signs of nausea or vomiting Anesthetic complications: no    Last Vitals:  Filed Vitals:   06/12/15 0815 06/12/15 0820  BP: 112/62 116/67  Pulse:    Temp:    Resp: 15 18    Last Pain: There were no vitals filed for this visit.               Seidy Labreck J

## 2015-06-12 NOTE — Discharge Instructions (Signed)
Cataract Surgery, Care After °Refer to this sheet in the next few weeks. These instructions provide you with information on caring for yourself after your procedure. Your caregiver may also give you more specific instructions. Your treatment has been planned according to current medical practices, but problems sometimes occur. Call your caregiver if you have any problems or questions after your procedure.  °HOME CARE INSTRUCTIONS  °· Avoid strenuous activities as directed by your caregiver. °· Ask your caregiver when you can resume driving. °· Use eyedrops or other medicines to help healing and control pressure inside your eye as directed by your caregiver. °· Only take over-the-counter or prescription medicines for pain, discomfort, or fever as directed by your caregiver. °· Do not to touch or rub your eyes. °· You may be instructed to use a protective shield during the first few days and nights after surgery. If not, wear sunglasses to protect your eyes. This is to protect the eye from pressure or from being accidentally bumped. °· Keep the area around your eye clean and dry. Avoid swimming or allowing water to hit you directly in the face while showering. Keep soap and shampoo out of your eyes. °· Do not bend or lift heavy objects. Bending increases pressure in the eye. You can walk, climb stairs, and do light household chores. °· Do not put a contact lens into the eye that had surgery until your caregiver says it is okay to do so. °· Ask your doctor when you can return to work. This will depend on the kind of work that you do. If you work in a dusty environment, you may be advised to wear protective eyewear for a period of time. °· Ask your caregiver when it will be safe to engage in sexual activity. °· Continue with your regular eye exams as directed by your caregiver. °What to expect: °· It is normal to feel itching and mild discomfort for a few days after cataract surgery. Some fluid discharge is also common,  and your eye may be sensitive to light and touch. °· After 1 to 2 days, even moderate discomfort should disappear. In most cases, healing will take about 6 weeks. °· If you received an intraocular lens (IOL), you may notice that colors are very bright or have a blue tinge. Also, if you have been in bright sunlight, everything may appear reddish for a few hours. If you see these color tinges, it is because your lens is clear and no longer cloudy. Within a few months after receiving an IOL, these extra colors should go away. When you have healed, you will probably need new glasses. °SEEK MEDICAL CARE IF:  °· You have increased bruising around your eye. °· You have discomfort not helped by medicine. °SEEK IMMEDIATE MEDICAL CARE IF:  °· You have a  fever. °· You have a worsening or sudden vision loss. °· You have redness, swelling, or increasing pain in the eye. °· You have a thick discharge from the eye that had surgery. °MAKE SURE YOU: °· Understand these instructions. °· Will watch your condition. °· Will get help right away if you are not doing well or get worse. °  °This information is not intended to replace advice given to you by your health care provider. Make sure you discuss any questions you have with your health care provider. °  °Document Released: 08/21/2004 Document Revised: 02/22/2014 Document Reviewed: 09/25/2010 °Elsevier Interactive Patient Education ©2016 [employer] ° °

## 2015-06-12 NOTE — Op Note (Signed)
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  Assistants: None  Anesthesia: Topical with MAC  Indications: Painless, progressive loss of vision with compromise of daily activities.  Surgery: Cataract Extraction with Intraocular lens Implant Left Eye, CPT Code [phone]  Discription: The patient had dilating drops and viscous lidocaine placed into the left eye in the pre-op holding area. After transfer to the operating room, a time out was performed. The patient was then prepped and draped. Beginning with a 54 degree blade a paracentesis port was made at the surgeon's 2 o'clock position. The anterior chamber was then filled with 1% non-preserved lidocaine with epinepherine. This was followed by filling the anterior chamber with Provisc. A Malyugan ring was placed into the anterior chamber using its injector. The loops were positioned with the Kuglan hook. A bent cystatome needle was used to create a continuous tear capsulotomy. Hydrodissection was performed with balanced salt solution on a Fine canula. The lens nucleus was then removed using the phacoemulsification handpiece. Residual cortex was removed with the I&A handpiece. The anterior chamber and capsular bag were refilled with Provisc. A posterior chamber intraocular lens was placed into the capsular bag with it's injector. The implant was positioned with the Kuglan hook. The Malyugan ring was disengaged from the iris margin and removed with its injector. The Provisc was then removed from the anterior chamber and capsular bag with the I&A handpiece. Stromal hydration of the main incision and paracentesis port was performed with BSS on a Fine canula. The wounds were tested for leak which was negative. The patient tolerated the procedure well. There were no  operative complications. The patient was then transferred to the recovery room in stable condition.  Complications: None  Specimen: None  EBL: None  Prosthetic device: Hoya Model 250, power 20.5, SN [id number].

## 2015-06-13 ENCOUNTER — Encounter (HOSPITAL_COMMUNITY): Payer: Self-pay | Admitting: Ophthalmology

## 2015-10-10 ENCOUNTER — Encounter: Payer: Self-pay | Admitting: Cardiology

## 2015-10-23 NOTE — Progress Notes (Signed)
HPI  The patient presents for evaluation of mitral valve repair.  Since last being seen he has done relatively well.  He gets some dyspnea walking up an incline and this is slowly worse than previous.  The patient denies any new symptoms such as chest discomfort, neck or arm discomfort. There has been no new PND or orthopnea. There have been no reported palpitations, presyncope or syncope.  He has some mild edema.     Allergies  Allergen Reactions  . Flomax [Tamsulosin Hcl] Other (See Comments)    Makes him "swimmy headed"    Current Outpatient Prescriptions  Medication Sig Dispense Refill  . amLODipine (NORVASC) 2.5 MG tablet Take 2.5 mg by mouth daily.     . clopidogrel (PLAVIX) 75 MG tablet Take 75 mg by mouth daily.    Marland Kitchen lisinopril (PRINIVIL,ZESTRIL) 10 MG tablet Take 10 mg by mouth daily.      . Multiple Vitamin (MULTIVITAMIN) tablet Take 1 tablet by mouth daily.      Marland Kitchen omeprazole (PRILOSEC) 20 MG capsule Take 20 mg by mouth 2 (two) times daily before a meal.     . pravastatin (PRAVACHOL) 20 MG tablet TAKE 1 TABLET (20 MG TOTAL) BY MOUTH EVERY EVENING. 30 tablet 4   No current facility-administered medications for this visit.     Past Medical History:  Diagnosis Date  . Allergy   . BPH (benign prostatic hyperplasia)   . Coronary artery disease excluded   . Diverticulosis   . Endocarditis, valve unspecified, unspecified cause   . GERD (gastroesophageal reflux disease)   . H/O mitral valve repair    Postoperative ring with postoperative atrial fibrillation  . Hiatal hernia 06/13/2002  . Unspecified essential hypertension   . Weakness of both arms 07/19/2012    Past Surgical History:  Procedure Laterality Date  . CATARACT EXTRACTION W/PHACO Right 05/12/2015   Procedure: CATARACT EXTRACTION PHACO AND INTRAOCULAR LENS PLACEMENT RIGHT EYE CDE=7.75;  Surgeon: Tonny Branch, MD;  Location: [facility];  Service: Ophthalmology;  Laterality: Right;  . CATARACT EXTRACTION W/PHACO Left  06/12/2015   Procedure: CATARACT EXTRACTION PHACO AND INTRAOCULAR LENS PLACEMENT (IOC);  Surgeon: Tonny Branch, MD;  Location: [facility];  Service: Ophthalmology;  Laterality: Left;  CDE:  13.17  . CHOLECYSTECTOMY    . KNEE SURGERY Left   . MITRAL VALVE REPAIR    . MV repair     2003  . ROTATOR CUFF REPAIR Left   . VASECTOMY      ROS:   As stated in the HPI and negative for all other systems.  PHYSICAL EXAM BP (!) 134/96   Pulse (!) 47   Ht 5\' 11"  (1.803 m)   Wt 237 lb 9.6 oz (107.8 kg)   SpO2 98%   BMI 33.14 kg/m  GENERAL:  Well appearing NECK:  No jugular venous distention, waveform within normal limits, carotid upstroke brisk and symmetric, no bruits, no thyromegaly LYMPHATICS:  No cervical, inguinal adenopathy LUNGS:  Clear to auscultation bilaterally CHEST:  Well healed left thoracotomy scar HEART:  PMI not displaced or sustained,S1 and S2 within normal limits, no S3, no S4, no clicks, no rubs, no murmurs ABD:  Flat, positive bowel sounds normal in frequency in pitch, no bruits, no rebound, no guarding, no midline pulsatile mass, no hepatomegaly, no splenomegaly, obese EXT:  2 plus pulses upper and decreased DP/PT bilateral. , trace edema, no cyanosis no clubbing    ASSESSMENT AND PLAN  MITRAL VALVE REPAIR:  I  will follow with an echo.  BRADYCARDIA:  His heart rate increased with walking in the office today.  No change in therapy is indicated.    CAROTID STENOSIS:  This was mild in the past an no imaging is indicated.   HTN:  The blood pressure is at target. No change in medications is indicated. We will continue with therapeutic lifestyle changes (TLC).  OBESITY:  The patient understands the need to lose weight with diet and exercise. We have discussed specific strategies for this.  DYSPNEA:  I will follow up with the echo.  I doubt that this is cardiac and it is probably related to deconditioning and weight.  I would reevaluate if this worsens as he exercises more and  loses weight.

## 2015-10-24 ENCOUNTER — Encounter: Payer: Self-pay | Admitting: Cardiology

## 2015-10-24 ENCOUNTER — Ambulatory Visit (INDEPENDENT_AMBULATORY_CARE_PROVIDER_SITE_OTHER): Payer: Medicare Other | Admitting: Cardiology

## 2015-10-24 VITALS — BP 134/96 | HR 47 | Ht 71.0 in | Wt 237.6 lb

## 2015-10-24 DIAGNOSIS — I493 Ventricular premature depolarization: Secondary | ICD-10-CM | POA: Diagnosis not present

## 2015-10-24 DIAGNOSIS — Z79899 Other long term (current) drug therapy: Secondary | ICD-10-CM | POA: Diagnosis not present

## 2015-10-24 DIAGNOSIS — Z9889 Other specified postprocedural states: Secondary | ICD-10-CM

## 2015-10-24 NOTE — Patient Instructions (Addendum)

## 2015-10-26 ENCOUNTER — Other Ambulatory Visit: Payer: Self-pay | Admitting: Cardiology

## 2015-10-27 NOTE — Telephone Encounter (Signed)
Rx request sent to pharmacy.  

## 2015-11-05 ENCOUNTER — Other Ambulatory Visit: Payer: Self-pay

## 2015-11-05 ENCOUNTER — Ambulatory Visit (HOSPITAL_COMMUNITY): Payer: Medicare Other | Attending: Cardiology

## 2015-11-05 DIAGNOSIS — Z9889 Other specified postprocedural states: Secondary | ICD-10-CM | POA: Insufficient documentation

## 2015-11-05 DIAGNOSIS — I10 Essential (primary) hypertension: Secondary | ICD-10-CM | POA: Diagnosis not present

## 2015-11-05 DIAGNOSIS — I517 Cardiomegaly: Secondary | ICD-10-CM | POA: Insufficient documentation

## 2015-11-05 DIAGNOSIS — Z952 Presence of prosthetic heart valve: Secondary | ICD-10-CM | POA: Insufficient documentation

## 2015-12-09 ENCOUNTER — Ambulatory Visit (INDEPENDENT_AMBULATORY_CARE_PROVIDER_SITE_OTHER): Payer: Medicare Other

## 2015-12-09 ENCOUNTER — Encounter (INDEPENDENT_AMBULATORY_CARE_PROVIDER_SITE_OTHER): Payer: Self-pay | Admitting: Orthopaedic Surgery

## 2015-12-09 ENCOUNTER — Ambulatory Visit (INDEPENDENT_AMBULATORY_CARE_PROVIDER_SITE_OTHER): Payer: Medicare Other | Admitting: Orthopaedic Surgery

## 2015-12-09 VITALS — BP 147/96 | HR 74 | Ht 71.0 in | Wt 231.0 lb

## 2015-12-09 DIAGNOSIS — M1711 Unilateral primary osteoarthritis, right knee: Secondary | ICD-10-CM | POA: Diagnosis not present

## 2015-12-09 DIAGNOSIS — M1712 Unilateral primary osteoarthritis, left knee: Secondary | ICD-10-CM | POA: Diagnosis not present

## 2015-12-09 DIAGNOSIS — M25562 Pain in left knee: Secondary | ICD-10-CM

## 2015-12-09 DIAGNOSIS — M25561 Pain in right knee: Secondary | ICD-10-CM

## 2015-12-09 MED ORDER — LIDOCAINE HCL 1 % IJ SOLN
3.0000 mL | INTRAMUSCULAR | Status: AC | PRN
Start: 2015-12-09 — End: 2015-12-09
  Administered 2015-12-09: 3 mL

## 2015-12-09 MED ORDER — METHYLPREDNISOLONE ACETATE 40 MG/ML IJ SUSP
40.0000 mg | INTRAMUSCULAR | Status: AC | PRN
  Administered 2015-12-09: 40 mg via INTRA_ARTICULAR

## 2015-12-09 MED ORDER — BUPIVACAINE HCL 0.25 % IJ SOLN
6.0000 mL | Freq: Once | INTRAMUSCULAR | Status: AC
  Administered 2015-12-09: 6 mL

## 2015-12-09 MED ORDER — LIDOCAINE HCL 1 % IJ SOLN
3.0000 mL | INTRAMUSCULAR | Status: AC | PRN
  Administered 2015-12-09: 3 mL

## 2015-12-09 NOTE — Progress Notes (Signed)
Office Visit Note   Patient: [name]           Date of Birth: Dec 07, 1935           MRN: [id number] Visit Date: 12/09/2015              Requested by: Dione Housekeeper, MD [address] [place], [place] [address] PCP: Sherrie Mustache, MD   Assessment & Plan: Visit Diagnoses:  1. Acute pain of left knee   2. Acute pain of right knee     Plan: patient will f/u in office 4 weeks. Discussed definitive treatment with total knee replacements.  Will need preop cardiac and medical clearances.    Follow-Up Instructions: No Follow-up on file.   Orders:  Orders Placed This Encounter  Procedures  . XR Knee 1-2 Views Right  . XR Knee 1-2 Views Left   No orders of the defined types were placed in this encounter.     Procedures: Large Joint Inj Date/Time: 12/09/2015 5:25 PM Performed by: Lanae Crumbly Authorized by: Lanae Crumbly   Consent Given by:  Patient Timeout: prior to procedure the correct patient, procedure, and site was verified   Location:  Knee Site:  R knee Needle Size:  25 G Needle Length:  1.5 inches Approach:  Anterolateral Ultrasound Guidance: No   Fluoroscopic Guidance: No   Medications:  3 mL lidocaine 1 %; 40 mg methylPREDNISolone acetate 40 MG/ML Aspiration Attempted: No    Large Joint Inj Date/Time: 12/09/2015 5:35 PM Performed by: Lanae Crumbly Authorized by: Lanae Crumbly   Consent Given by:  Patient Timeout: prior to procedure the correct patient, procedure, and site was verified   Location:  Knee Site:  L knee Needle Size:  25 G Needle Length:  1.5 inches Approach:  Anterolateral Ultrasound Guidance: No   Fluoroscopic Guidance: No   Medications:  3 mL lidocaine 1 %; 40 mg methylPREDNISolone acetate 40 MG/ML Aspiration Attempted: No       Clinical Data: No additional findings.   Subjective: Chief Complaint  Patient presents with  . Right Knee - Pain  . Left Knee - Pain    Comes in with bilat knee pain.  Progressively worsening last few months.  Hx end stage djd.  C/o pain with ambulation and squatting.  Admits swelling, popping.      Review of Systems  Constitutional: Negative.   HENT: Negative.   Respiratory: Negative.   Cardiovascular: Negative.   Gastrointestinal: Negative.   Musculoskeletal: Positive for gait problem and joint swelling.  Psychiatric/Behavioral: Negative.      Objective: Vital Signs: BP (!) 147/96 (BP Location: Right Arm)   Pulse 74   Ht 5\' 11"  (1.803 m)   Wt 231 lb (104.8 kg)   BMI 32.22 kg/m   Physical Exam  Constitutional: He is oriented to person, place, and time. He appears well-developed and well-nourished. No distress.  HENT:  Head: Normocephalic and atraumatic.  Eyes: EOM are normal. Pupils are equal, round, and reactive to light.  Neck: Normal range of motion.  Cardiovascular: Normal rate.   Pulmonary/Chest: Effort normal. No respiratory distress.  Abdominal: He exhibits no distension.  Musculoskeletal: He exhibits tenderness (bilat knee med/lat joint line ttp.  positive effusion. positive crepitus. ) and deformity (varus. ).  Neurological: He is alert and oriented to person, place, and time.  Skin: Skin is warm and dry.  Psychiatric: He has a normal mood and affect.    Ortho Exam  Specialty  Comments:  No specialty comments available.  Imaging: No results found.   PMFS History: Patient Active Problem List   Diagnosis Date Noted  . Weakness of both arms 07/19/2012  . Colon cancer screening 06/22/2012  . Unspecified venous (peripheral) insufficiency 05/05/2012  . Chronic venous insufficiency 04/07/2012  . Other malaise and fatigue 03/16/2012  . Disturbance of skin sensation 03/16/2012  . Carotid stenosis 08/16/2011  . Obesity 08/16/2011  . Coronary artery disease excluded   . H/O mitral valve repair   . Endocarditis, valve unspecified, unspecified cause   . Dizziness 12/16/2010  . Dyslipidemia 09/23/2010  . Essential  hypertension 04/06/2007  . ATRIAL FIBRILLATION 04/06/2007  . OBSTRUCTIVE SLEEP APNEA 04/06/2007  . DYSPNEA 04/06/2007   Past Medical History:  Diagnosis Date  . Allergy   . BPH (benign prostatic hyperplasia)   . Coronary artery disease excluded   . Diverticulosis   . Endocarditis, valve unspecified, unspecified cause   . GERD (gastroesophageal reflux disease)   . H/O mitral valve repair    Postoperative ring with postoperative atrial fibrillation  . Hiatal hernia 06/13/2002  . Unspecified essential hypertension   . Weakness of both arms 07/19/2012    Family History  Problem Relation Age of Onset  . Congestive Heart Failure Father   . Heart disease Father   . Stroke Brother   . Stroke Sister   . Diabetes Mellitus II Brother   . Diabetes Mellitus II Sister   . Lung cancer Brother   . Colon cancer Neg Hx   . Esophageal cancer Neg Hx   . Rectal cancer Neg Hx   . Stomach cancer Neg Hx     Past Surgical History:  Procedure Laterality Date  . CATARACT EXTRACTION W/PHACO Right 05/12/2015   Procedure: CATARACT EXTRACTION PHACO AND INTRAOCULAR LENS PLACEMENT RIGHT EYE CDE=7.75;  Surgeon: Tonny Branch, MD;  Location: [facility];  Service: Ophthalmology;  Laterality: Right;  . CATARACT EXTRACTION W/PHACO Left 06/12/2015   Procedure: CATARACT EXTRACTION PHACO AND INTRAOCULAR LENS PLACEMENT (IOC);  Surgeon: Tonny Branch, MD;  Location: [facility];  Service: Ophthalmology;  Laterality: Left;  CDE:  13.17  . CHOLECYSTECTOMY    . KNEE SURGERY Left   . MITRAL VALVE REPAIR    . MV repair     2003  . ROTATOR CUFF REPAIR Left   . VASECTOMY     Social History   Occupational History  .  Machine Music therapist     RETIRED   Social History Main Topics  . Smoking status: Former Smoker    Packs/day: 3.00    Years: 35.00    Types: Cigarettes    Quit date: 02/16/1988  . Smokeless tobacco: Never Used     Comment:  Year Quit: 1990   . Alcohol use No  . Drug use: No  . Sexual activity: Not on file

## 2015-12-09 NOTE — Progress Notes (Deleted)
Office Visit Note   Patient: [name]           Date of Birth: 1935-05-14           MRN: [id number] Visit Date: 12/09/2015              Requested by: Dione Housekeeper, MD [address] [place], [place] [address] PCP: Sherrie Mustache, MD   Assessment & Plan: Visit Diagnoses: No diagnosis found.  Plan: ***  Follow-Up Instructions: No Follow-up on file.   Orders:  No orders of the defined types were placed in this encounter.  No orders of the defined types were placed in this encounter.     Procedures: No procedures performed   Clinical Data: No additional findings.   Subjective: Chief Complaint  Patient presents with  . Right Knee - Pain  . Left Knee - Pain    Patient coming in office today with complaints of bilateral knee pain. States left knee pain greater than right knee. Pain ongoing but has become worse this past month. No known Injury. Patient has trouble after sitting for period of time, makes hard to get up. Denies any popping/cracking, numbness, weakness. Most knee pain on anterior aspect when he is walking and when he sits states the pain is in the back of his knees.     Review of Systems   Objective: Vital Signs: There were no vitals taken for this visit.  Physical Exam  Ortho Exam  Specialty Comments:  No specialty comments available.  Imaging: No results found.   PMFS History: Patient Active Problem List   Diagnosis Date Noted  . Weakness of both arms 07/19/2012  . Colon cancer screening 06/22/2012  . Unspecified venous (peripheral) insufficiency 05/05/2012  . Chronic venous insufficiency 04/07/2012  . Other malaise and fatigue 03/16/2012  . Disturbance of skin sensation 03/16/2012  . Carotid stenosis 08/16/2011  . Obesity 08/16/2011  . Coronary artery disease excluded   . H/O mitral valve repair   . Endocarditis, valve unspecified, unspecified cause   . Dizziness 12/16/2010  . Dyslipidemia 09/23/2010  . Essential  hypertension 04/06/2007  . ATRIAL FIBRILLATION 04/06/2007  . OBSTRUCTIVE SLEEP APNEA 04/06/2007  . DYSPNEA 04/06/2007   Past Medical History:  Diagnosis Date  . Allergy   . BPH (benign prostatic hyperplasia)   . Coronary artery disease excluded   . Diverticulosis   . Endocarditis, valve unspecified, unspecified cause   . GERD (gastroesophageal reflux disease)   . H/O mitral valve repair    Postoperative ring with postoperative atrial fibrillation  . Hiatal hernia 06/13/2002  . Unspecified essential hypertension   . Weakness of both arms 07/19/2012    Family History  Problem Relation Age of Onset  . Congestive Heart Failure Father   . Heart disease Father   . Stroke Brother   . Stroke Sister   . Diabetes Mellitus II Brother   . Diabetes Mellitus II Sister   . Lung cancer Brother   . Colon cancer Neg Hx   . Esophageal cancer Neg Hx   . Rectal cancer Neg Hx   . Stomach cancer Neg Hx     Past Surgical History:  Procedure Laterality Date  . CATARACT EXTRACTION W/PHACO Right 05/12/2015   Procedure: CATARACT EXTRACTION PHACO AND INTRAOCULAR LENS PLACEMENT RIGHT EYE CDE=7.75;  Surgeon: Tonny Branch, MD;  Location: [facility];  Service: Ophthalmology;  Laterality: Right;  . CATARACT EXTRACTION W/PHACO Left 06/12/2015   Procedure: CATARACT EXTRACTION PHACO AND INTRAOCULAR LENS  PLACEMENT (IOC);  Surgeon: Tonny Branch, MD;  Location: [facility];  Service: Ophthalmology;  Laterality: Left;  CDE:  13.17  . CHOLECYSTECTOMY    . KNEE SURGERY Left   . MITRAL VALVE REPAIR    . MV repair     2003  . ROTATOR CUFF REPAIR Left   . VASECTOMY     Social History   Occupational History  .  Machine Music therapist     RETIRED   Social History Main Topics  . Smoking status: Former Smoker    Packs/day: 3.00    Years: 35.00    Types: Cigarettes    Quit date: 02/16/1988  . Smokeless tobacco: Never Used     Comment:  Year Quit: 1990   . Alcohol use No  . Drug use: No  . Sexual activity: Not on file

## 2016-01-07 ENCOUNTER — Ambulatory Visit (INDEPENDENT_AMBULATORY_CARE_PROVIDER_SITE_OTHER): Payer: Medicare Other | Admitting: Orthopaedic Surgery

## 2016-01-07 ENCOUNTER — Encounter (INDEPENDENT_AMBULATORY_CARE_PROVIDER_SITE_OTHER): Payer: Self-pay | Admitting: Orthopaedic Surgery

## 2016-01-07 ENCOUNTER — Encounter (INDEPENDENT_AMBULATORY_CARE_PROVIDER_SITE_OTHER): Payer: Self-pay

## 2016-01-07 VITALS — BP 143/82 | HR 63 | Ht 71.0 in | Wt 230.0 lb

## 2016-01-07 DIAGNOSIS — M170 Bilateral primary osteoarthritis of knee: Secondary | ICD-10-CM

## 2016-01-07 NOTE — Progress Notes (Signed)
Office Visit Note   Patient: [name]           Date of Birth: 09-30-1935           MRN: [id number] Visit Date: 01/07/2016              Requested by: Dione Housekeeper, MD [address] [place], [place] [address] PCP: Sherrie Mustache, MD   Assessment & Plan: Visit Diagnoses:  1. Primary osteoarthritis of both knees     Plan: Had long discussion with patient and his wife  present regarding ongoing bilateral knee pain. Failed conservative treatment this point with recent intra-articular Marcaine/Depo-Medrol injections. He has also done home exercises strengthening and knee range of motion along with activity modification. We discussed continued conservative management with viscose supplementation but I do not think that this would be of any significant benefit due to the ongoing pain and the arthritic changes that he has in both knees. Advised that best treatment option at this point would be total knee replacements. Would do the right knee first. Surgery procedure along with potential rehabilitation/recovery time discussed in detail. Knee model shown. All question answered. While under anesthesia would repeat left knee intra-articular Marcaine/Depo-Medrol injection. Preoperative paperwork filled out but we will need preoperative medical and cardiac clearances. Medical history significant for carotid artery stenosis, coronary artery disease, mitral valve repair and atrial fibrillation.  Follow-Up Instructions: Return in about 1 month (around 02/06/2016).   Orders:  No orders of the defined types were placed in this encounter.  No orders of the defined types were placed in this encounter.     Procedures: No procedures performed   Clinical Data: No additional findings.   Subjective: Chief Complaint  Patient presents with  . Left Knee - Follow-up  . Right Knee - Follow-up    Patient returns for bilateral knee pain. He is status post bilateral knee injections on  12/09/2015. He thinks that the injections helped x a week to 10 days. Still has difficulty going up steps. Continued pain with walking. He takes Aleve as needed.  States he did not get long-term improvement with recent knee injections. He is ready to discuss definitive treatment with total knee replacements.  Review of Systems  Constitutional: Negative.   HENT: Negative.   Respiratory: Negative.   Cardiovascular: Negative.   Musculoskeletal: Positive for joint swelling.     Objective: Vital Signs: BP (!) 143/82   Pulse 63   Ht 5\' 11"  (1.803 m)   Wt 230 lb (104.3 kg)   BMI 32.08 kg/m   Physical Exam  Constitutional: He is oriented to person, place, and time. He appears well-developed. No distress.  HENT:  Head: Normocephalic.  Eyes: EOM are normal. Pupils are equal, round, and reactive to light.  Pulmonary/Chest: No respiratory distress.  Neurological: He is alert and oriented to person, place, and time.  Skin: Skin is warm and dry.  Psychiatric: He has a normal mood and affect.    Ortho Exam Gait is somewhat antalgic. Bilateral knee range of motion about 90-95 flexion. He lacks a few degrees full extension. Joint line is tender. Positive crepitus. Small palpable effusion.  Bilateral calves are nontender and he is neurovascularly intact. Skin warm and dry. Specialty Comments:  No specialty comments available.  Imaging: No results found.   PMFS History: Patient Active Problem List   Diagnosis Date Noted  . Weakness of both arms 07/19/2012  . Colon cancer screening 06/22/2012  . Unspecified venous (peripheral) insufficiency 05/05/2012  .  Chronic venous insufficiency 04/07/2012  . Other malaise and fatigue 03/16/2012  . Disturbance of skin sensation 03/16/2012  . Carotid stenosis 08/16/2011  . Obesity 08/16/2011  . Coronary artery disease excluded   . H/O mitral valve repair   . Endocarditis, valve unspecified, unspecified cause   . Dizziness 12/16/2010  .  Dyslipidemia 09/23/2010  . Essential hypertension 04/06/2007  . ATRIAL FIBRILLATION 04/06/2007  . OBSTRUCTIVE SLEEP APNEA 04/06/2007  . DYSPNEA 04/06/2007   Past Medical History:  Diagnosis Date  . Allergy   . BPH (benign prostatic hyperplasia)   . Coronary artery disease excluded   . Diverticulosis   . Endocarditis, valve unspecified, unspecified cause   . GERD (gastroesophageal reflux disease)   . H/O mitral valve repair    Postoperative ring with postoperative atrial fibrillation  . Hiatal hernia 06/13/2002  . Unspecified essential hypertension   . Weakness of both arms 07/19/2012    Family History  Problem Relation Age of Onset  . Congestive Heart Failure Father   . Heart disease Father   . Stroke Brother   . Stroke Sister   . Diabetes Mellitus II Brother   . Diabetes Mellitus II Sister   . Lung cancer Brother   . Colon cancer Neg Hx   . Esophageal cancer Neg Hx   . Rectal cancer Neg Hx   . Stomach cancer Neg Hx     Past Surgical History:  Procedure Laterality Date  . CATARACT EXTRACTION W/PHACO Right 05/12/2015   Procedure: CATARACT EXTRACTION PHACO AND INTRAOCULAR LENS PLACEMENT RIGHT EYE CDE=7.75;  Surgeon: Tonny Branch, MD;  Location: [facility];  Service: Ophthalmology;  Laterality: Right;  . CATARACT EXTRACTION W/PHACO Left 06/12/2015   Procedure: CATARACT EXTRACTION PHACO AND INTRAOCULAR LENS PLACEMENT (IOC);  Surgeon: Tonny Branch, MD;  Location: [facility];  Service: Ophthalmology;  Laterality: Left;  CDE:  13.17  . CHOLECYSTECTOMY    . KNEE SURGERY Left   . MITRAL VALVE REPAIR    . MV repair     2003  . ROTATOR CUFF REPAIR Left   . VASECTOMY     Social History   Occupational History  .  Machine Music therapist     RETIRED   Social History Main Topics  . Smoking status: Former Smoker    Packs/day: 3.00    Years: 35.00    Types: Cigarettes    Quit date: 02/16/1988  . Smokeless tobacco: Never Used     Comment:  Year Quit: 1990   . Alcohol use No  . Drug use: No  .  Sexual activity: Not on file

## 2016-01-13 ENCOUNTER — Telehealth (INDEPENDENT_AMBULATORY_CARE_PROVIDER_SITE_OTHER): Payer: Self-pay | Admitting: Orthopaedic Surgery

## 2016-01-13 NOTE — Telephone Encounter (Signed)
Please advise 

## 2016-01-13 NOTE — Telephone Encounter (Signed)
OK fine we can do left knee . thanks

## 2016-01-13 NOTE — Telephone Encounter (Signed)
Noted on surgical order.

## 2016-01-13 NOTE — Telephone Encounter (Signed)
PATIENT'S WIFE CALLED THIS MORNING, PATIENT IS HAVING MORE PAIN IN HIS LEFT LEG THAN HIS RIGHT LEG HE IS REQUESTING HIS KNEE REPLACEMENT BE FOR THE LEFT LEG.  Cb#: [phone]

## 2016-01-15 ENCOUNTER — Encounter (INDEPENDENT_AMBULATORY_CARE_PROVIDER_SITE_OTHER): Payer: Self-pay | Admitting: Orthopaedic Surgery

## 2016-01-15 ENCOUNTER — Ambulatory Visit (INDEPENDENT_AMBULATORY_CARE_PROVIDER_SITE_OTHER): Payer: Medicare Other | Admitting: Orthopaedic Surgery

## 2016-01-15 VITALS — BP 121/75 | HR 55 | Ht 71.0 in | Wt 230.0 lb

## 2016-01-15 DIAGNOSIS — M1712 Unilateral primary osteoarthritis, left knee: Secondary | ICD-10-CM

## 2016-01-15 DIAGNOSIS — M1711 Unilateral primary osteoarthritis, right knee: Secondary | ICD-10-CM | POA: Diagnosis not present

## 2016-01-15 NOTE — Progress Notes (Signed)
Office Visit Note   Patient: [name]           Date of Birth: 11-30-1935           MRN: [id number] Visit Date: 01/15/2016              Requested by: Dione Housekeeper, MD [address] [place], [place] [address] PCP: Sherrie Mustache, MD   Assessment & Plan: Visit Diagnoses:  1. Unilateral primary osteoarthritis, left knee   2. Unilateral primary osteoarthritis, right knee     Plan: Patient has bilateral knee osteoarthritis had increased pain in his left knee more stiffness more problems standing. Originally he had plan to schedule a right total knee arthroplasty before the holidays but now with increased left knee pain he preferred to have the left knee done first. Both knee show tricompartmental degenerative arthritis with joint space narrowing and marginal osteophytes and subchondral sclerosis. We'll proceed with left total knee arthroplasty once we have a cardiology clearance. We have discussed postoperative plan with the therapy usual tonight stay in the hospital outpatient therapy after home physical therapy. Problems with stiffness use of therapy biceps exercises discussed. He understands and requests to proceed.  We discussed his previous Careers adviser repair done by Dr. Amador Cunas at [facility]. He'll have a cardiology preoperative clearance. Plan is for left total knee arthroplasty.  Follow-Up Instructions: No Follow-up on file.   Orders:  No orders of the defined types were placed in this encounter.  No orders of the defined types were placed in this encounter.     Procedures: No procedures performed   Clinical Data: No additional findings.   Subjective: Chief Complaint  Patient presents with  . Left Knee - Pain    Patient returns to discuss total knee arthroplasty. When seen in the office the other week, his right knee seemed to be worse and a sheet was filled out to schedule him for right knee total arthroplasty pending  clearances. He is concerned that he might not be able to do physical therapy if his left knee is messed up. He is having left knee pain now.      Review of Systems  Constitutional: Negative for chills and diaphoresis.  HENT: Negative for ear discharge, ear pain and nosebleeds.   Eyes: Negative for discharge and visual disturbance.  Respiratory: Negative for cough, choking and shortness of breath.   Cardiovascular: Negative for chest pain and palpitations.       Positive for atrial fib coronary artery disease. This mitral valve repair  Gastrointestinal: Negative for abdominal distention and abdominal pain.  Endocrine: Negative for cold intolerance and heat intolerance.  Genitourinary: Negative for flank pain and hematuria.  Skin: Negative for rash and wound.  Neurological: Negative for seizures and speech difficulty.  Hematological: Negative for adenopathy. Does not bruise/bleed easily.  Psychiatric/Behavioral: Negative for agitation and suicidal ideas.     Objective: Vital Signs: BP 121/75   Pulse (!) 55   Ht 5\' 11"  (1.803 m)   Wt 230 lb (104.3 kg)   BMI 32.08 kg/m   Physical Exam  Constitutional: He is oriented to person, place, and time. He appears well-developed and well-nourished.  HENT:  Head: Normocephalic and atraumatic.  Eyes: EOM are normal. Pupils are equal, round, and reactive to light.  Neck: No tracheal deviation present. No thyromegaly present.  Cardiovascular: Normal rate.   Pulmonary/Chest: Effort normal. He has no wheezes.  Abdominal: Soft. Bowel sounds are normal.  Neurological: He is alert and oriented to person, place, and time.  Skin: Skin is warm and dry. Capillary refill takes less than 2 seconds.  Psychiatric: He has a normal mood and affect. His behavior is normal. Judgment and thought content normal.    Ortho Exam patient has bilateral knee crepitus he comes within 5 full extension and flexes to 100. This with knee range of motion distal pulses  are intact no pitting edema. Logroll to the hips.  Specialty Comments:  No specialty comments available.  Imaging: No results found.   PMFS History: Patient Active Problem List   Diagnosis Date Noted  . Unilateral primary osteoarthritis, right knee 01/15/2016  . Weakness of both arms 07/19/2012  . Colon cancer screening 06/22/2012  . Unspecified venous (peripheral) insufficiency 05/05/2012  . Chronic venous insufficiency 04/07/2012  . Other malaise and fatigue 03/16/2012  . Disturbance of skin sensation 03/16/2012  . Carotid stenosis 08/16/2011  . Obesity 08/16/2011  . Coronary artery disease excluded   . H/O mitral valve repair   . Endocarditis, valve unspecified, unspecified cause   . Dizziness 12/16/2010  . Dyslipidemia 09/23/2010  . Essential hypertension 04/06/2007  . ATRIAL FIBRILLATION 04/06/2007  . OBSTRUCTIVE SLEEP APNEA 04/06/2007  . DYSPNEA 04/06/2007   Past Medical History:  Diagnosis Date  . Allergy   . BPH (benign prostatic hyperplasia)   . Coronary artery disease excluded   . Diverticulosis   . Endocarditis, valve unspecified, unspecified cause   . GERD (gastroesophageal reflux disease)   . H/O mitral valve repair    Postoperative ring with postoperative atrial fibrillation  . Hiatal hernia 06/13/2002  . Unspecified essential hypertension   . Weakness of both arms 07/19/2012    Family History  Problem Relation Age of Onset  . Congestive Heart Failure Father   . Heart disease Father   . Stroke Brother   . Stroke Sister   . Diabetes Mellitus II Brother   . Diabetes Mellitus II Sister   . Lung cancer Brother   . Colon cancer Neg Hx   . Esophageal cancer Neg Hx   . Rectal cancer Neg Hx   . Stomach cancer Neg Hx     Past Surgical History:  Procedure Laterality Date  . CATARACT EXTRACTION W/PHACO Right 05/12/2015   Procedure: CATARACT EXTRACTION PHACO AND INTRAOCULAR LENS PLACEMENT RIGHT EYE CDE=7.75;  Surgeon: Tonny Branch, MD;  Location: [facility];   Service: Ophthalmology;  Laterality: Right;  . CATARACT EXTRACTION W/PHACO Left 06/12/2015   Procedure: CATARACT EXTRACTION PHACO AND INTRAOCULAR LENS PLACEMENT (IOC);  Surgeon: Tonny Branch, MD;  Location: [facility];  Service: Ophthalmology;  Laterality: Left;  CDE:  13.17  . CHOLECYSTECTOMY    . KNEE SURGERY Left   . MITRAL VALVE REPAIR    . MV repair     2003  . ROTATOR CUFF REPAIR Left   . VASECTOMY     Social History   Occupational History  .  Machine Music therapist     RETIRED   Social History Main Topics  . Smoking status: Former Smoker    Packs/day: 3.00    Years: 35.00    Types: Cigarettes    Quit date: 02/16/1988  . Smokeless tobacco: Never Used     Comment:  Year Quit: 1990   . Alcohol use No  . Drug use: No  . Sexual activity: Not on file

## 2016-02-06 ENCOUNTER — Telehealth: Payer: Self-pay | Admitting: *Deleted

## 2016-02-06 NOTE — Telephone Encounter (Signed)
No contraindication to surgery.  OK to hold Plavix five days prior to surgery.

## 2016-02-06 NOTE — Telephone Encounter (Signed)
Requesting surgical clearance:   1. Type of surgery: Left Total Knee Arthroplasty  2. Surgeon: Rodell Perna  3. Surgical date: 02/20/2016  4. Medications that need to be help: Plavix  5. [place]: (P) [phone] (F) [phone]   Pt saw you last on 10/24/2015, Is pt cleared for surgery, if so how long can Plavix be held?

## 2016-02-10 NOTE — Telephone Encounter (Signed)
Clearance faxed [facility] via epic and fax machine

## 2016-02-10 NOTE — Pre-Procedure Instructions (Signed)
    [name]  02/10/2016      [id number] - [place], [place] [place] [address] Phone: [phone] Fax: [phone]    Your procedure is scheduled on Fri., Jan 5  Report to [facility] Admitting at 5:30 A.M.  Call this number if you have problems the morning of surgery:  [phone]   Remember:  Do not eat food or drink liquids after midnight on Thurs, Jan. 4  Take these medicines the morning of surgery with A SIP OF WATER: amlodipine(norvasc), omeprazole (prilosec)             1 week prior to surgery stop: advil, mortin, aleve,mobic, goody's, BC Powders, vitamins/herbal medicines.             Stop plavix 5 days prior to surgery per Dr. Percival Spanish   Do not wear jewelry.  Do not wear lotions, powders, or cologne, or deoderant.  Do not shave 48 hours prior to surgery.  Men may shave face and neck.  Do not bring valuables to the hospital.  [facility] is not responsible for any belongings or valuables.  Contacts, dentures or bridgework may not be worn into surgery.  Leave your suitcase in the car.  After surgery it may be brought to your room.  For patients admitted to the hospital, discharge time will be determined by your treatment team.  Patients discharged the day of surgery will not be allowed to drive home.    Special instructions: review preparing for surgery handout  Please read over the following fact sheets that you were given. Coughing and Deep Breathing, MRSA Information and Surgical Site Infection Prevention

## 2016-02-11 ENCOUNTER — Encounter (HOSPITAL_COMMUNITY)
Admission: RE | Admit: 2016-02-11 | Discharge: 2016-02-11 | Disposition: A | Discharge status: Home / Self Care | Payer: Medicare Other | Source: Ambulatory Visit | Attending: Orthopaedic Surgery | Admitting: Orthopaedic Surgery

## 2016-02-11 ENCOUNTER — Ambulatory Visit (HOSPITAL_COMMUNITY)
Admission: RE | Admit: 2016-02-11 | Discharge: 2016-02-11 | Disposition: A | Discharge status: Home / Self Care | Payer: Medicare Other | Source: Ambulatory Visit | Attending: Surgery | Admitting: Surgery

## 2016-02-11 ENCOUNTER — Encounter (HOSPITAL_COMMUNITY): Payer: Self-pay

## 2016-02-11 DIAGNOSIS — R918 Other nonspecific abnormal finding of lung field: Secondary | ICD-10-CM | POA: Insufficient documentation

## 2016-02-11 DIAGNOSIS — M170 Bilateral primary osteoarthritis of knee: Secondary | ICD-10-CM | POA: Insufficient documentation

## 2016-02-11 DIAGNOSIS — Z01818 Encounter for other preprocedural examination: Secondary | ICD-10-CM | POA: Diagnosis present

## 2016-02-11 DIAGNOSIS — Z01812 Encounter for preprocedural laboratory examination: Secondary | ICD-10-CM | POA: Insufficient documentation

## 2016-02-11 HISTORY — DX: Unspecified osteoarthritis, unspecified site: M19.90

## 2016-02-11 HISTORY — DX: Personal history of urinary calculi: Z87.442

## 2016-02-11 LAB — COMPREHENSIVE METABOLIC PANEL
ALBUMIN: 4.5 g/dL (ref 3.5–5.0)
ALT: 15 U/L — ABNORMAL LOW (ref 17–63)
AST: 19 U/L (ref 15–41)
Alkaline Phosphatase: 71 U/L (ref 38–126)
Anion gap: 9 (ref 5–15)
BUN: 15 mg/dL (ref 6–20)
CHLORIDE: 103 mmol/L (ref 101–111)
CO2: 27 mmol/L (ref 22–32)
Calcium: 9 mg/dL (ref 8.9–10.3)
Creatinine, Ser: 0.75 mg/dL (ref 0.61–1.24)
GFR calc Af Amer: >60 mL/min (ref >60)
GLUCOSE: 99 mg/dL (ref 65–99)
POTASSIUM: 3.9 mmol/L (ref 3.5–5.1)
Sodium: 139 mmol/L (ref 135–145)
Total Bilirubin: 1.1 mg/dL (ref 0.3–1.2)
Total Protein: 7.4 g/dL (ref 6.5–8.1)

## 2016-02-11 LAB — URINALYSIS, ROUTINE W REFLEX MICROSCOPIC
Bilirubin Urine: NEGATIVE
Glucose, UA: NEGATIVE mg/dL
Hgb urine dipstick: NEGATIVE
KETONES UR: NEGATIVE mg/dL
LEUKOCYTES UA: NEGATIVE
NITRITE: NEGATIVE
PH: 6 (ref 5.0–8.0)
Protein, ur: NEGATIVE mg/dL
Specific Gravity, Urine: 1.018 (ref 1.005–1.030)

## 2016-02-11 LAB — CBC
HCT: 47.3 % (ref 39.0–52.0)
Hemoglobin: 16.8 g/dL (ref 13.0–17.0)
MCH: 32.6 pg (ref 26.0–34.0)
MCHC: 35.5 g/dL (ref 30.0–36.0)
MCV: 91.8 fL (ref 78.0–100.0)
PLATELETS: 233 10*3/uL (ref 150–400)
RBC: 5.15 MIL/uL (ref 4.22–5.81)
RDW: 13 % (ref 11.5–15.5)
WBC: 8.7 10*3/uL (ref 4.0–10.5)

## 2016-02-11 LAB — PROTIME-INR
INR: 1.04
PROTHROMBIN TIME: 13.7 s (ref 11.4–15.2)

## 2016-02-11 LAB — APTT: APTT: 31 s (ref 24–36)

## 2016-02-11 LAB — SURGICAL PCR SCREEN
MRSA, PCR: NEGATIVE
STAPHYLOCOCCUS AUREUS: NEGATIVE

## 2016-02-11 NOTE — Pre-Procedure Instructions (Signed)
[name]  02/11/2016      [id number] - [place], [place] [address] Phone: [phone] Fax: [phone]    Your procedure is scheduled on 02/20/2016  Report to [facility] Admitting at 5:30 A.M.  Call this number if you have problems the morning of surgery:  [phone]   Last dose of Plavix & Meloxicam & Aleve will be on 12/30. You may take Tylenol in place of Meloxicam   Remember:  Do not eat food or drink liquids after midnight.  Take these medicines the morning of surgery with A SIP OF WATER : omeprazole, amlodipine    Do not wear jewelry   Do not wear lotions, powders, or perfumes, or deoderant.              Men may shave face and neck.   Do not bring valuables to the hospital.   [facility] is not responsible for any belongings or valuables.  Contacts, dentures or bridgework may not be worn into surgery.  Leave your suitcase in the car.  After surgery it may be brought to your room.  For patients admitted to the hospital, discharge time will be determined by your treatment team.  Patients discharged the day of surgery will not be allowed to drive home.   Name and phone number of your driver:   With wife   Special instructions:  Special Instructions: [facility] - Preparing for Surgery  Before surgery, you can play an important role.  Because skin is not sterile, your skin needs to be as free of germs as possible.  You can reduce the number of germs on you skin by washing with CHG (chlorahexidine gluconate) soap before surgery.  CHG is an antiseptic cleaner which kills germs and bonds with the skin to continue killing germs even after washing.  Please DO NOT use if you have an allergy to CHG or antibacterial soaps.  If your skin becomes reddened/irritated stop using the CHG and inform your nurse when you arrive at Short Stay.  Do not shave (including legs and underarms) for at least  48 hours prior to the first CHG shower.  You may shave your face.  Please follow these instructions carefully:   1.  Shower with CHG Soap the night before surgery and the  morning of Surgery.  2.  If you choose to wash your hair, wash your hair first as usual with your  normal shampoo.  3.  After you shampoo, rinse your hair and body thoroughly to remove the  Shampoo.  4.  Use CHG as you would any other liquid soap.  You can apply chg directly to the skin and wash gently with scrungie or a clean washcloth.  5.  Apply the CHG Soap to your body ONLY FROM THE NECK DOWN.    Do not use on open wounds or open sores.  Avoid contact with your eyes, ears, mouth and genitals (private parts).  Wash genitals (private parts)   with your normal soap.  6.  Wash thoroughly, paying special attention to the area where your surgery will be performed.  7.  Thoroughly rinse your body with warm water from the neck down.  8.  DO NOT shower/wash with your normal soap after using and rinsing off   the CHG Soap.  9.  Pat yourself dry with a clean towel.  10.  Wear clean pajamas.            11.  Place clean sheets on your bed the night of your first shower and do not sleep with pets.  Day of Surgery  Do not apply any lotions/deodorants the morning of surgery.  Please wear clean clothes to the hospital/surgery center.  Please read over the following fact sheets that you were given. Pain Booklet, Coughing and Deep Breathing, Total Joint Packet, MRSA Information and Surgical Site Infection Prevention

## 2016-02-11 NOTE — Progress Notes (Signed)
Pt. Denies all chest concerns; has cardiac history & cxr that requires anesth, review; will send this chart to the attention of anesth.

## 2016-02-11 NOTE — Telephone Encounter (Signed)
APPROVAL

## 2016-02-11 NOTE — Progress Notes (Signed)
Pt. Followed by Dr. Alger Simons for PCP. Pt. Also followed by Dr. Percival Spanish, h/o mitral valve repair. Clearance is given by Dr. Percival Spanish; & told pt. Per Dr. Rosezella Florida order when to hold Plavix.

## 2016-02-13 ENCOUNTER — Telehealth (INDEPENDENT_AMBULATORY_CARE_PROVIDER_SITE_OTHER): Payer: Self-pay | Admitting: Orthopedic Surgery

## 2016-02-13 ENCOUNTER — Telehealth: Payer: Self-pay | Admitting: Cardiology

## 2016-02-13 NOTE — Telephone Encounter (Signed)
Please advise 

## 2016-02-13 NOTE — Telephone Encounter (Signed)
Etta Quill- NP (anesthesia)  [facility] is calling on behalf on patient [name]) she needs to have his Plavix medication stopped for 7 days which would mean that the medication needs to be stopped today. Please call [phone]. Thanks.

## 2016-02-13 NOTE — Telephone Encounter (Signed)
Spoke w Marjorie Smolder who acknowledged communication and voiced thanks. Aware to call if further needs.

## 2016-02-13 NOTE — Progress Notes (Signed)
Anesthesia Chart Review:  Pt is a 80 year old male scheduled for L total knee arthroplasty with knee injection on 02/20/2016 with Rodell Perna, M.D.  - PCP is Dione Housekeeper, MD - Cardiologist is Minus Breeding, MD, last office visit 10/24/15, who has cleared pt for surgery.   PMH includes:  HTN, endocarditis, mitral valve repair (2003 at [facility]), GERD.  Former smoker. BMI 33.5. S/p cataract extraction 05/12/15 & 06/12/15.   Medications include amlodipine, Plavix, lisinopril, Prilosec, pravastatin. Pt to stop plavix 7 days before surgery.  - I spoke with pt. Last dose plavix was 02/12/16.   Preoperative labs reviewed.    CXR 02/11/16: Low lung volumes with mild basilar atelectasis. Mild bibasilar pneumonia cannot be excluded.  - I notified Malachy Mood in Dr. Lorin Mercy' office of CXR results.    EKG 05/07/15: Sinus bradycardia (55 bpm). Incomplete RBBB.   Echo 11/05/15:  - Left ventricle: The cavity size was normal. Wall thickness was increased in a pattern of mild LVH. Systolic function was normal. The estimated ejection fraction was in the range of 55% to 60%. Indeterminant diastolic function. - Aortic valve: There was no stenosis. - Mitral valve: Status post mitral valve repair. Mean gradient 4 mmHg with pressure half-time not significantly prolonged. Probably minimal mitral stenosis. There was no significant regurgitation. Pressure half-time: 128 ms. Mean gradient (D): 52mm Hg. Valve area by pressure half-time: 1.83 cm^2. - Left atrium: The atrium was mildly to moderately dilated. - Right ventricle: The cavity size was normal. Systolic function was normal. - Right atrium: The atrium was mildly dilated. - Tricuspid valve: Peak RV-RA gradient (S): 27 mm Hg. - Pulmonary arteries: PA peak pressure: 30 mm Hg (S). - Inferior vena cava: The vessel was normal in size. The respirophasic diameter changes were in the normal range (= 50%), consistent with normal central venous pressure. - Impressions: Normal  LV size with mild LV hypertrophy. EF 55-60%. Normal RV size and systolic function. Biatrial enlargement. Status post mitral valve repair without significant stenosis or regurgitation.  Carotid doppler 10/04/13: B ICAs with mild amount of fibrous plaque with no evidence of significant stenosis.  Cardiac cath 02/14/07: Nonobstructive coronary artery disease (mild luminal irregularities in RCA). Well-preserved ejection fraction.  If no changes, I anticipate pt can proceed with surgery as scheduled.   Willeen Cass, FNP-BC [facility]/Anesthesiology Phone: [phone] 02/13/2016 2:33 PM

## 2016-02-13 NOTE — Telephone Encounter (Signed)
OK to hold plavix for 7 days.  Roanna Reaves [place] MD, [facility]

## 2016-02-13 NOTE — Telephone Encounter (Signed)
Levada Dy, NP with [facility] is reviewing [name] chart for his upcoming total knee arthroplasty (02/20/16).  His pre-op chest x-ray report stated " mild pneumonia cannot be excluded".  Mr. Pamala Hurry denied having any chest complaints.

## 2016-02-13 NOTE — Telephone Encounter (Signed)
Spoke w Marjorie Smolder. Notes that she saw 12/22 note regarding clearance w 5 day plavix hold. She is requesting 7 day hold on this medication which would require interruption starting today. Have made her aware Dr. Percival Spanish is out of office. I will need to have [facility] review this today to [facility]. She's aware I will follow up w recommendations.

## 2016-02-17 NOTE — Telephone Encounter (Signed)
WBC normal. Not symptomatic . We can repeat post op. Anesthesiologist will review.

## 2016-02-17 NOTE — Telephone Encounter (Signed)
noted 

## 2016-02-18 NOTE — Telephone Encounter (Signed)
Per Dr Lorin Mercy proceed with surgery.

## 2016-02-18 NOTE — Telephone Encounter (Signed)
Just wanted to be sure that you were aware.

## 2016-02-19 NOTE — H&P (Signed)
TOTAL KNEE ADMISSION H&P  Patient is being admitted for left total knee arthroplasty.  Subjective:  Chief Complaint:left knee pain.  HPI: [name], 81 y.o. male, has a history of pain and functional disability in the left knee due to arthritis and has failed non-surgical conservative treatments for greater than 12 weeks to includeNSAID's and/or analgesics, corticosteriod injections, use of assistive devices, weight reduction as appropriate and activity modification.  Onset of symptoms was gradual, starting 10 years ago with gradually worsening course since that time. Patient currently rates pain in the left knee(s) at 10 out of 10 with activity. Patient has night pain, worsening of pain with activity and weight bearing, pain that interferes with activities of daily living, crepitus and joint swelling.  Patient has evidence of subchondral sclerosis, periarticular osteophytes and joint space narrowing by imaging studies. . There is no active infection.  Patient Active Problem List   Diagnosis Date Noted  . Unilateral primary osteoarthritis, right knee 01/15/2016  . Weakness of both arms 07/19/2012  . Colon cancer screening 06/22/2012  . Unspecified venous (peripheral) insufficiency 05/05/2012  . Chronic venous insufficiency 04/07/2012  . Other malaise and fatigue 03/16/2012  . Disturbance of skin sensation 03/16/2012  . Carotid stenosis 08/16/2011  . Obesity 08/16/2011  . Coronary artery disease excluded   . H/O mitral valve repair   . Endocarditis, valve unspecified, unspecified cause   . Dizziness 12/16/2010  . Dyslipidemia 09/23/2010  . Essential hypertension 04/06/2007  . ATRIAL FIBRILLATION 04/06/2007  . OBSTRUCTIVE SLEEP APNEA 04/06/2007  . DYSPNEA 04/06/2007   Past Medical History:  Diagnosis Date  . Allergy   . Arthritis    knees & hips  . BPH (benign prostatic hyperplasia)   . Coronary artery disease excluded   . Diverticulosis   . Endocarditis, valve  unspecified, unspecified cause   . GERD (gastroesophageal reflux disease)   . H/O mitral valve repair    Postoperative ring with postoperative atrial fibrillation  . Hiatal hernia 06/13/2002  . History of kidney stones    passed spontaneously x2   . Unspecified essential hypertension   . Weakness of both arms 07/19/2012    Past Surgical History:  Procedure Laterality Date  . CATARACT EXTRACTION W/PHACO Right 05/12/2015   Procedure: CATARACT EXTRACTION PHACO AND INTRAOCULAR LENS PLACEMENT RIGHT EYE CDE=7.75;  Surgeon: Tonny Branch, MD;  Location: [facility];  Service: Ophthalmology;  Laterality: Right;  . CATARACT EXTRACTION W/PHACO Left 06/12/2015   Procedure: CATARACT EXTRACTION PHACO AND INTRAOCULAR LENS PLACEMENT (IOC);  Surgeon: Tonny Branch, MD;  Location: [facility];  Service: Ophthalmology;  Laterality: Left;  CDE:  13.17  . CHOLECYSTECTOMY    . KNEE SURGERY Left   . MITRAL VALVE REPAIR    . MV repair     2003  . ROTATOR CUFF REPAIR Left   . VASECTOMY      No prescriptions prior to admission.   Allergies  Allergen Reactions  . Flomax [Tamsulosin Hcl] Other (See Comments)    Makes him "swimmy headed"    Social History  Substance Use Topics  . Smoking status: Former Smoker    Packs/day: 3.00    Years: 35.00    Types: Cigarettes    Quit date: 02/16/1988  . Smokeless tobacco: Never Used     Comment:  Year Quit: 1990   . Alcohol use No    Family History  Problem Relation Age of Onset  . Congestive Heart Failure Father   . Heart disease Father   .  Stroke Brother   . Stroke Sister   . Diabetes Mellitus II Brother   . Diabetes Mellitus II Sister   . Lung cancer Brother   . Colon cancer Neg Hx   . Esophageal cancer Neg Hx   . Rectal cancer Neg Hx   . Stomach cancer Neg Hx      Review of Systems  Constitutional: Negative.   HENT: Negative.   Respiratory: Negative.   Cardiovascular: Negative.   Gastrointestinal: Negative.   Genitourinary: Negative.   Musculoskeletal:  Positive for joint pain.  Skin: Negative.   Neurological: Negative.     Objective:  Physical Exam  Constitutional: He is oriented to person, place, and time. He appears well-developed. No distress.  HENT:  Head: Normocephalic.  Eyes: Pupils are equal, round, and reactive to light.  Neck: Normal range of motion.  Respiratory: No respiratory distress.  GI: He exhibits no distension.  Musculoskeletal:  Left knee decreased range of motion. Positive crepitus. Positive effusion. Joint line tender. Right knee also has joint line tenderness.  Neurological: He is alert and oriented to person, place, and time.  Skin: Skin is warm and dry.  Psychiatric: He has a normal mood and affect.    Vital signs in last 24 hours:    Labs:   Estimated body mass index is 33.5 kg/m as calculated from the following:   Height as of 02/11/16: 5\' 11"  (1.803 m).   Weight as of 02/11/16: 240 lb 3.2 oz (109 kg).   Imaging Review Plain radiographs demonstrate moderate degenerative joint disease of the left knee(s). The overall alignment ismild varus. The bone quality appears to be good for age and reported activity level.  Assessment/Plan:  End stage arthritis, left knee   The patient history, physical examination, clinical judgment of the provider and imaging studies are consistent with end stage degenerative joint disease of the left knee(s) and total knee arthroplasty is deemed medically necessary. The treatment options including medical management, injection therapy arthroscopy and arthroplasty were discussed at length. The risks and benefits of total knee arthroplasty were presented and reviewed. The risks due to aseptic loosening, infection, stiffness, patella tracking problems, thromboembolic complications and other imponderables were discussed. The patient acknowledged the explanation, agreed to proceed with the plan and consent was signed. Patient is being admitted for inpatient treatment for  surgery, pain control, PT, OT, prophylactic antibiotics, VTE prophylaxis, progressive ambulation and ADL's and discharge planning. The patient is planning to be discharged home with home health services

## 2016-02-19 NOTE — Anesthesia Preprocedure Evaluation (Addendum)
Anesthesia Evaluation  Patient identified by MRN, date of birth, ID band Patient awake    Reviewed: Allergy & Precautions, NPO status , Patient's Chart, lab work & pertinent test results  Airway Mallampati: III  TM Distance: >3 FB Neck ROM: Full    Dental  (+) Teeth Intact, Dental Advisory Given, Caps, Partial Lower   Pulmonary sleep apnea , former smoker,    Pulmonary exam normal breath sounds clear to auscultation       Cardiovascular hypertension, Pt. on medications (-) angina+ CAD and + Peripheral Vascular Disease  (-) Past MI and (-) CHF Normal cardiovascular exam Rhythm:Regular Rate:Normal  H/o MV repair 2/2 endocarditis, post-op AFib  EKG 05/07/15: Sinus bradycardia (55 bpm). Incomplete RBBB.   Echo 11/05/15:  - Left ventricle: The cavity size was normal. Wall thickness wasincreased in a pattern of mild LVH. Systolic function was normal.The estimated ejection fraction was in the range of 55% to 60%.Indeterminant diastolic function. - Aortic valve: There was no stenosis. - Mitral valve: Status post mitral valve repair. Mean gradient 4 mmHg with pressure half-time not significantly prolonged.Probably minimal mitral stenosis. There was no significantregurgitation. Pressure half-time: 128 ms. Mean gradient (D): 96mm Hg. Valve area by pressure half-time: 1.83 cm^2. - Left atrium: The atrium was mildly to moderately dilated. - Right ventricle: The cavity size was normal. Systolic functionwas normal. - Right atrium: The atrium was mildly dilated. - Tricuspid valve: Peak RV-RA gradient (S): 27 mm Hg. - Pulmonary arteries: PA peak pressure: 30 mm Hg (S). - Inferior vena cava: The vessel was normal in size. The respirophasic diameter changes were in the normal range (= 50%), consistent with normal central venous pressure. - Impressions: Normal LV size with mild LV hypertrophy. EF 55-60%. Normal RV size and systolic function.  Biatrial enlargement. Status post mitral valve repair without significant stenosis or regurgitation.   Neuro/Psych negative neurological ROS     GI/Hepatic Neg liver ROS, hiatal hernia, GERD  Medicated,  Endo/Other  negative endocrine ROS  Renal/GU negative Renal ROS     Musculoskeletal  (+) Arthritis , Osteoarthritis,    Abdominal   Peds  Hematology  (+) Blood dyscrasia (Plavix), , Plt 233k   Anesthesia Other Findings Day of surgery medications reviewed with the patient.  Reproductive/Obstetrics                           Anesthesia Physical Anesthesia Plan  ASA: III  Anesthesia Plan: General   Post-op Pain Management: GA combined w/ Regional for post-op pain   Induction: Intravenous  Airway Management Planned: Oral ETT  Additional Equipment:   Intra-op Plan:   Post-operative Plan: Extubation in OR  Informed Consent: I have reviewed the patients History and Physical, chart, labs and discussed the procedure including the risks, benefits and alternatives for the proposed anesthesia with the patient or authorized representative who has indicated his/her understanding and acceptance.   Dental advisory given  Plan Discussed with: CRNA  Anesthesia Plan Comments: (Risks/benefits of general anesthesia discussed with patient including risk of damage to teeth, lips, gum, and tongue, nausea/vomiting, allergic reactions to medications, and the possibility of heart attack, stroke and death.  All patient questions answered.  Patient wishes to proceed.  Discussed risks and benefits of adductor canal block including failure, bleeding, infection, nerve damage, weakness. Discussed that the block may not prevent all of the pain in the knee. Questions answered. Patient consents to block. )  Anesthesia Quick Evaluation  

## 2016-02-20 ENCOUNTER — Inpatient Hospital Stay (HOSPITAL_COMMUNITY): Payer: Medicare Other

## 2016-02-20 ENCOUNTER — Inpatient Hospital Stay (HOSPITAL_COMMUNITY): Payer: Medicare Other | Admitting: Anesthesiology

## 2016-02-20 ENCOUNTER — Inpatient Hospital Stay (HOSPITAL_COMMUNITY): Payer: Medicare Other | Admitting: Emergency Medicine

## 2016-02-20 ENCOUNTER — Encounter (HOSPITAL_COMMUNITY)
Admission: RE | Disposition: A | Discharge status: Skilled Nursing Facility | Payer: Self-pay | Source: Ambulatory Visit | Attending: Orthopaedic Surgery

## 2016-02-20 ENCOUNTER — Inpatient Hospital Stay (HOSPITAL_COMMUNITY)
Admission: RE | Admit: 2016-02-20 | Discharge: 2016-02-23 | DRG: 470 | Disposition: A | Discharge status: Skilled Nursing Facility | Payer: Medicare Other | Source: Ambulatory Visit | Attending: Orthopaedic Surgery | Admitting: Orthopaedic Surgery

## 2016-02-20 ENCOUNTER — Encounter (HOSPITAL_COMMUNITY): Payer: Self-pay | Admitting: *Deleted

## 2016-02-20 DIAGNOSIS — K219 Gastro-esophageal reflux disease without esophagitis: Secondary | ICD-10-CM | POA: Diagnosis present

## 2016-02-20 DIAGNOSIS — Z9842 Cataract extraction status, left eye: Secondary | ICD-10-CM

## 2016-02-20 DIAGNOSIS — Z87891 Personal history of nicotine dependence: Secondary | ICD-10-CM

## 2016-02-20 DIAGNOSIS — E785 Hyperlipidemia, unspecified: Secondary | ICD-10-CM | POA: Diagnosis present

## 2016-02-20 DIAGNOSIS — I10 Essential (primary) hypertension: Secondary | ICD-10-CM | POA: Diagnosis present

## 2016-02-20 DIAGNOSIS — E669 Obesity, unspecified: Secondary | ICD-10-CM | POA: Diagnosis present

## 2016-02-20 DIAGNOSIS — Z961 Presence of intraocular lens: Secondary | ICD-10-CM | POA: Diagnosis present

## 2016-02-20 DIAGNOSIS — Z7982 Long term (current) use of aspirin: Secondary | ICD-10-CM

## 2016-02-20 DIAGNOSIS — Z9889 Other specified postprocedural states: Secondary | ICD-10-CM

## 2016-02-20 DIAGNOSIS — I872 Venous insufficiency (chronic) (peripheral): Secondary | ICD-10-CM | POA: Diagnosis present

## 2016-02-20 DIAGNOSIS — M1712 Unilateral primary osteoarthritis, left knee: Secondary | ICD-10-CM

## 2016-02-20 DIAGNOSIS — Z6833 Body mass index (BMI) 33.0-33.9, adult: Secondary | ICD-10-CM

## 2016-02-20 DIAGNOSIS — Z09 Encounter for follow-up examination after completed treatment for conditions other than malignant neoplasm: Secondary | ICD-10-CM

## 2016-02-20 DIAGNOSIS — Z9841 Cataract extraction status, right eye: Secondary | ICD-10-CM

## 2016-02-20 DIAGNOSIS — M1711 Unilateral primary osteoarthritis, right knee: Secondary | ICD-10-CM | POA: Diagnosis present

## 2016-02-20 DIAGNOSIS — Z79899 Other long term (current) drug therapy: Secondary | ICD-10-CM | POA: Diagnosis not present

## 2016-02-20 DIAGNOSIS — I480 Paroxysmal atrial fibrillation: Secondary | ICD-10-CM

## 2016-02-20 DIAGNOSIS — M25561 Pain in right knee: Secondary | ICD-10-CM | POA: Diagnosis present

## 2016-02-20 DIAGNOSIS — G4733 Obstructive sleep apnea (adult) (pediatric): Secondary | ICD-10-CM | POA: Diagnosis present

## 2016-02-20 DIAGNOSIS — M170 Bilateral primary osteoarthritis of knee: Principal | ICD-10-CM | POA: Diagnosis present

## 2016-02-20 DIAGNOSIS — N400 Enlarged prostate without lower urinary tract symptoms: Secondary | ICD-10-CM | POA: Diagnosis present

## 2016-02-20 DIAGNOSIS — I2510 Atherosclerotic heart disease of native coronary artery without angina pectoris: Secondary | ICD-10-CM | POA: Diagnosis present

## 2016-02-20 HISTORY — PX: TOTAL KNEE ARTHROPLASTY: SHX125

## 2016-02-20 HISTORY — PX: INJECTION KNEE: SHX2446

## 2016-02-20 SURGERY — ARTHROPLASTY, KNEE, TOTAL
Anesthesia: General | Site: Knee | Laterality: Right

## 2016-02-20 MED ORDER — PROPOFOL 10 MG/ML IV BOLUS
INTRAVENOUS | Status: AC
  Filled 2016-02-20: qty 20

## 2016-02-20 MED ORDER — ACETAMINOPHEN 650 MG RE SUPP: 650.0000 mg | Freq: Four times a day (QID) | RECTAL | Status: DC | PRN

## 2016-02-20 MED ORDER — MIDAZOLAM HCL 2 MG/2ML IJ SOLN
INTRAMUSCULAR | Status: AC
  Filled 2016-02-20: qty 2

## 2016-02-20 MED ORDER — SUGAMMADEX SODIUM 200 MG/2ML IV SOLN
INTRAVENOUS | Status: DC | PRN
  Administered 2016-02-20: 200 mg via INTRAVENOUS

## 2016-02-20 MED ORDER — FENTANYL CITRATE (PF) 100 MCG/2ML IJ SOLN
INTRAMUSCULAR | Status: AC
  Filled 2016-02-20: qty 2

## 2016-02-20 MED ORDER — DOCUSATE SODIUM 100 MG PO CAPS
100.0000 mg | ORAL_CAPSULE | Freq: Two times a day (BID) | ORAL | Status: DC
  Administered 2016-02-20 – 2016-02-23 (×7): 100 mg via ORAL
  Filled 2016-02-20 (×7): qty 1

## 2016-02-20 MED ORDER — SODIUM CHLORIDE 0.9 % IR SOLN
Status: DC | PRN
  Administered 2016-02-20: 3000 mL

## 2016-02-20 MED ORDER — FENTANYL CITRATE (PF) 100 MCG/2ML IJ SOLN
25.0000 ug | INTRAMUSCULAR | Status: DC | PRN
  Administered 2016-02-20 (×2): 50 ug via INTRAVENOUS

## 2016-02-20 MED ORDER — METOPROLOL TARTRATE 25 MG PO TABS
25.0000 mg | ORAL_TABLET | Freq: Two times a day (BID) | ORAL | Status: DC
  Administered 2016-02-20 – 2016-02-23 (×8): 25 mg via ORAL
  Filled 2016-02-20 (×8): qty 1

## 2016-02-20 MED ORDER — METOCLOPRAMIDE HCL 5 MG PO TABS: 5.0000 mg | ORAL_TABLET | Freq: Three times a day (TID) | ORAL | Status: DC | PRN

## 2016-02-20 MED ORDER — METHOCARBAMOL 500 MG PO TABS
500.0000 mg | ORAL_TABLET | Freq: Four times a day (QID) | ORAL | Status: DC | PRN
  Administered 2016-02-20 – 2016-02-22 (×6): 500 mg via ORAL
  Filled 2016-02-20 (×7): qty 1

## 2016-02-20 MED ORDER — METHOCARBAMOL 1000 MG/10ML IJ SOLN
500.0000 mg | Freq: Four times a day (QID) | INTRAVENOUS | Status: DC | PRN
  Filled 2016-02-20: qty 5

## 2016-02-20 MED ORDER — CHLORHEXIDINE GLUCONATE 4 % EX LIQD: 60.0000 mL | Freq: Once | CUTANEOUS | Status: DC

## 2016-02-20 MED ORDER — PRAVASTATIN SODIUM 20 MG PO TABS
20.0000 mg | ORAL_TABLET | Freq: Every day | ORAL | Status: DC
  Administered 2016-02-20 – 2016-02-23 (×4): 20 mg via ORAL
  Filled 2016-02-20 (×4): qty 1

## 2016-02-20 MED ORDER — CEFAZOLIN SODIUM-DEXTROSE 2-4 GM/100ML-% IV SOLN
2.0000 g | INTRAVENOUS | Status: AC
  Administered 2016-02-20: 2 g via INTRAVENOUS

## 2016-02-20 MED ORDER — 0.9 % SODIUM CHLORIDE (POUR BTL) OPTIME
TOPICAL | Status: DC | PRN
  Administered 2016-02-20: 1000 mL

## 2016-02-20 MED ORDER — METOCLOPRAMIDE HCL 5 MG/ML IJ SOLN: 5.0000 mg | Freq: Three times a day (TID) | INTRAMUSCULAR | Status: DC | PRN

## 2016-02-20 MED ORDER — FENTANYL CITRATE (PF) 100 MCG/2ML IJ SOLN
INTRAMUSCULAR | Status: DC | PRN
  Administered 2016-02-20 (×6): 50 ug via INTRAVENOUS

## 2016-02-20 MED ORDER — PHENYLEPHRINE HCL 10 MG/ML IJ SOLN
INTRAVENOUS | Status: DC | PRN
  Administered 2016-02-20: 50 ug/min via INTRAVENOUS

## 2016-02-20 MED ORDER — BUPIVACAINE HCL (PF) 0.25 % IJ SOLN
INTRAMUSCULAR | Status: AC
  Filled 2016-02-20: qty 30

## 2016-02-20 MED ORDER — OXYCODONE HCL 5 MG PO TABS
5.0000 mg | ORAL_TABLET | ORAL | Status: DC | PRN
  Administered 2016-02-20: 10 mg via ORAL
  Administered 2016-02-20 – 2016-02-21 (×2): 5 mg via ORAL
  Administered 2016-02-21 – 2016-02-23 (×6): 10 mg via ORAL
  Filled 2016-02-20 (×2): qty 2
  Filled 2016-02-20: qty 1
  Filled 2016-02-20 (×5): qty 2
  Filled 2016-02-20: qty 1
  Filled 2016-02-20 (×2): qty 2

## 2016-02-20 MED ORDER — ACETAMINOPHEN 325 MG PO TABS
650.0000 mg | ORAL_TABLET | Freq: Four times a day (QID) | ORAL | Status: DC | PRN
  Administered 2016-02-21 – 2016-02-23 (×3): 650 mg via ORAL
  Filled 2016-02-20 (×3): qty 2

## 2016-02-20 MED ORDER — CLOPIDOGREL BISULFATE 75 MG PO TABS
75.0000 mg | ORAL_TABLET | Freq: Every day | ORAL | Status: DC
  Administered 2016-02-20 – 2016-02-23 (×4): 75 mg via ORAL
  Filled 2016-02-20 (×4): qty 1

## 2016-02-20 MED ORDER — POLYETHYLENE GLYCOL 3350 17 G PO PACK: 17.0000 g | PACK | Freq: Every day | ORAL | Status: DC | PRN

## 2016-02-20 MED ORDER — CEFAZOLIN SODIUM-DEXTROSE 2-4 GM/100ML-% IV SOLN
INTRAVENOUS | Status: AC
  Filled 2016-02-20: qty 100

## 2016-02-20 MED ORDER — ROCURONIUM BROMIDE 100 MG/10ML IV SOLN
INTRAVENOUS | Status: DC | PRN
  Administered 2016-02-20: 50 mg via INTRAVENOUS

## 2016-02-20 MED ORDER — ONDANSETRON HCL 4 MG/2ML IJ SOLN
INTRAMUSCULAR | Status: DC | PRN
  Administered 2016-02-20: 4 mg via INTRAVENOUS

## 2016-02-20 MED ORDER — ONDANSETRON HCL 4 MG/2ML IJ SOLN: 4.0000 mg | Freq: Once | INTRAMUSCULAR | Status: DC | PRN

## 2016-02-20 MED ORDER — SODIUM CHLORIDE 0.9 % IV SOLN
INTRAVENOUS | Status: DC
  Administered 2016-02-20: 12:00:00 via INTRAVENOUS

## 2016-02-20 MED ORDER — MENTHOL 3 MG MT LOZG: 1.0000 | LOZENGE | OROMUCOSAL | Status: DC | PRN

## 2016-02-20 MED ORDER — AMLODIPINE BESYLATE 2.5 MG PO TABS
2.5000 mg | ORAL_TABLET | Freq: Every day | ORAL | Status: DC
  Administered 2016-02-21 – 2016-02-23 (×3): 2.5 mg via ORAL
  Filled 2016-02-20 (×3): qty 1

## 2016-02-20 MED ORDER — ONDANSETRON HCL 4 MG PO TABS: 4.0000 mg | ORAL_TABLET | Freq: Four times a day (QID) | ORAL | Status: DC | PRN

## 2016-02-20 MED ORDER — ROCURONIUM BROMIDE 50 MG/5ML IV SOSY
PREFILLED_SYRINGE | INTRAVENOUS | Status: AC
  Filled 2016-02-20: qty 5

## 2016-02-20 MED ORDER — ASPIRIN EC 325 MG PO TBEC
325.0000 mg | DELAYED_RELEASE_TABLET | Freq: Every day | ORAL | Status: DC
  Administered 2016-02-21 – 2016-02-23 (×3): 325 mg via ORAL
  Filled 2016-02-20 (×3): qty 1

## 2016-02-20 MED ORDER — CEFAZOLIN IN D5W 1 GM/50ML IV SOLN
1.0000 g | Freq: Three times a day (TID) | INTRAVENOUS | Status: AC
  Administered 2016-02-20 (×2): 1 g via INTRAVENOUS
  Filled 2016-02-20 (×2): qty 50

## 2016-02-20 MED ORDER — METHYLPREDNISOLONE ACETATE 80 MG/ML IJ SUSP
INTRAMUSCULAR | Status: DC | PRN
  Administered 2016-02-20: 80 mg via INTRA_ARTICULAR

## 2016-02-20 MED ORDER — LACTATED RINGERS IV SOLN
INTRAVENOUS | Status: DC | PRN
  Administered 2016-02-20 (×2): via INTRAVENOUS

## 2016-02-20 MED ORDER — ONDANSETRON HCL 4 MG/2ML IJ SOLN
INTRAMUSCULAR | Status: AC
  Filled 2016-02-20: qty 2

## 2016-02-20 MED ORDER — FLEET ENEMA 7-19 GM/118ML RE ENEM: 1.0000 | ENEMA | Freq: Once | RECTAL | Status: DC | PRN

## 2016-02-20 MED ORDER — FENTANYL CITRATE (PF) 100 MCG/2ML IJ SOLN
INTRAMUSCULAR | Status: AC
  Administered 2016-02-20: 50 ug via INTRAVENOUS
  Filled 2016-02-20: qty 2

## 2016-02-20 MED ORDER — LIDOCAINE 2% (20 MG/ML) 5 ML SYRINGE
INTRAMUSCULAR | Status: AC
  Filled 2016-02-20: qty 5

## 2016-02-20 MED ORDER — LIDOCAINE HCL (CARDIAC) 20 MG/ML IV SOLN
INTRAVENOUS | Status: DC | PRN
  Administered 2016-02-20: 100 mg via INTRAVENOUS

## 2016-02-20 MED ORDER — MIDAZOLAM HCL 2 MG/2ML IJ SOLN
INTRAMUSCULAR | Status: DC | PRN
  Administered 2016-02-20: 1 mg via INTRAVENOUS

## 2016-02-20 MED ORDER — PROPOFOL 10 MG/ML IV BOLUS
INTRAVENOUS | Status: DC | PRN
  Administered 2016-02-20: 200 mg via INTRAVENOUS

## 2016-02-20 MED ORDER — SUGAMMADEX SODIUM 200 MG/2ML IV SOLN
INTRAVENOUS | Status: AC
  Filled 2016-02-20: qty 2

## 2016-02-20 MED ORDER — METHYLPREDNISOLONE ACETATE 80 MG/ML IJ SUSP
INTRAMUSCULAR | Status: AC
  Filled 2016-02-20: qty 1

## 2016-02-20 MED ORDER — ONDANSETRON HCL 4 MG/2ML IJ SOLN: 4.0000 mg | Freq: Four times a day (QID) | INTRAMUSCULAR | Status: DC | PRN

## 2016-02-20 MED ORDER — PHENOL 1.4 % MT LIQD: 1.0000 | OROMUCOSAL | Status: DC | PRN

## 2016-02-20 MED ORDER — BUPIVACAINE HCL (PF) 0.25 % IJ SOLN
INTRAMUSCULAR | Status: DC | PRN
  Administered 2016-02-20: 20 mL
  Administered 2016-02-20: 4 mL via INTRA_ARTICULAR

## 2016-02-20 MED ORDER — LISINOPRIL 10 MG PO TABS
10.0000 mg | ORAL_TABLET | Freq: Every day | ORAL | Status: DC
  Administered 2016-02-21 – 2016-02-23 (×3): 10 mg via ORAL
  Filled 2016-02-20 (×3): qty 1

## 2016-02-20 MED ORDER — HYDROMORPHONE HCL 2 MG/ML IJ SOLN
0.5000 mg | INTRAMUSCULAR | Status: DC | PRN
  Administered 2016-02-20 (×2): 1 mg via INTRAVENOUS
  Filled 2016-02-20 (×2): qty 1

## 2016-02-20 MED ORDER — PHENYLEPHRINE HCL 10 MG/ML IJ SOLN
INTRAMUSCULAR | Status: DC | PRN
  Administered 2016-02-20 (×4): 80 ug via INTRAVENOUS

## 2016-02-20 MED ORDER — BUPIVACAINE LIPOSOME 1.3 % IJ SUSP
20.0000 mL | INTRAMUSCULAR | Status: AC
  Administered 2016-02-20: 20 mL
  Filled 2016-02-20: qty 20

## 2016-02-20 SURGICAL SUPPLY — 72 items
APL SKNCLS STERI-STRIP NONHPOA (GAUZE/BANDAGES/DRESSINGS) ×2
BANDAGE ACE 4X5 VEL STRL LF (GAUZE/BANDAGES/DRESSINGS) ×4 IMPLANT
BANDAGE ESMARK 6X9 LF (GAUZE/BANDAGES/DRESSINGS) ×2 IMPLANT
BENZOIN TINCTURE PRP APPL 2/3 (GAUZE/BANDAGES/DRESSINGS) ×4 IMPLANT
BLADE SAGITTAL 25.0X1.19X90 (BLADE) ×3 IMPLANT
BLADE SAGITTAL 25.0X1.19X90MM (BLADE) ×1
BLADE SAW SGTL 13X75X1.27 (BLADE) ×4 IMPLANT
BNDG CMPR 9X6 STRL LF SNTH (GAUZE/BANDAGES/DRESSINGS) ×2
BNDG CMPR MED 10X6 ELC LF (GAUZE/BANDAGES/DRESSINGS) ×2
BNDG ELASTIC 6X10 VLCR STRL LF (GAUZE/BANDAGES/DRESSINGS) ×4 IMPLANT
BNDG ESMARK 6X9 LF (GAUZE/BANDAGES/DRESSINGS) ×4
BOWL SMART MIX CTS (DISPOSABLE) ×4 IMPLANT
CAPT KNEE TOTAL 3 ATTUNE ×2 IMPLANT
CEMENT HV SMART SET (Cement) ×8 IMPLANT
CLOSURE WOUND 1/2 X4 (GAUZE/BANDAGES/DRESSINGS) ×1
COVER SURGICAL LIGHT HANDLE (MISCELLANEOUS) ×4 IMPLANT
CUFF TOURNIQUET SINGLE 34IN LL (TOURNIQUET CUFF) ×4 IMPLANT
CUFF TOURNIQUET SINGLE 44IN (TOURNIQUET CUFF) IMPLANT
DRAPE ORTHO SPLIT 77X108 STRL (DRAPES) ×8
DRAPE SURG ORHT 6 SPLT 77X108 (DRAPES) ×4 IMPLANT
DRAPE U-SHAPE 47X51 STRL (DRAPES) ×4 IMPLANT
DRSG PAD ABDOMINAL 8X10 ST (GAUZE/BANDAGES/DRESSINGS) ×4 IMPLANT
DURAPREP 26ML APPLICATOR (WOUND CARE) ×4 IMPLANT
ELECT REM PT RETURN 9FT ADLT (ELECTROSURGICAL) ×4
ELECTRODE REM PT RTRN 9FT ADLT (ELECTROSURGICAL) ×2 IMPLANT
EVACUATOR 1/8 PVC DRAIN (DRAIN) IMPLANT
FACESHIELD WRAPAROUND (MASK) ×4 IMPLANT
FACESHIELD WRAPAROUND OR TEAM (MASK) ×2 IMPLANT
GAUZE SPONGE 4X4 12PLY STRL (GAUZE/BANDAGES/DRESSINGS) ×4 IMPLANT
GAUZE XEROFORM 5X9 LF (GAUZE/BANDAGES/DRESSINGS) ×4 IMPLANT
GLOVE BIOGEL PI IND STRL 8 (GLOVE) ×4 IMPLANT
GLOVE BIOGEL PI INDICATOR 8 (GLOVE) ×4
GLOVE ORTHO TXT STRL SZ7.5 (GLOVE) ×8 IMPLANT
GOWN STRL REUS W/ TWL LRG LVL3 (GOWN DISPOSABLE) ×2 IMPLANT
GOWN STRL REUS W/ TWL XL LVL3 (GOWN DISPOSABLE) ×2 IMPLANT
GOWN STRL REUS W/TWL 2XL LVL3 (GOWN DISPOSABLE) ×4 IMPLANT
GOWN STRL REUS W/TWL LRG LVL3 (GOWN DISPOSABLE) ×4
GOWN STRL REUS W/TWL XL LVL3 (GOWN DISPOSABLE) ×4
HANDPIECE INTERPULSE COAX TIP (DISPOSABLE) ×4
IMMOBILIZER KNEE 22 UNIV (SOFTGOODS) ×4 IMPLANT
KIT BASIN OR (CUSTOM PROCEDURE TRAY) ×4 IMPLANT
KIT ROOM TURNOVER OR (KITS) ×4 IMPLANT
MANIFOLD NEPTUNE II (INSTRUMENTS) ×4 IMPLANT
MARKER SKIN DUAL TIP RULER LAB (MISCELLANEOUS) ×4 IMPLANT
NDL HYPO 25GX1X1/2 BEV (NEEDLE) ×2 IMPLANT
NDL SPNL 18GX3.5 QUINCKE PK (NEEDLE) IMPLANT
NEEDLE HYPO 25GX1X1/2 BEV (NEEDLE) ×4 IMPLANT
NEEDLE SPNL 18GX3.5 QUINCKE PK (NEEDLE) ×4 IMPLANT
NS IRRIG 1000ML POUR BTL (IV SOLUTION) ×4 IMPLANT
PACK TOTAL JOINT (CUSTOM PROCEDURE TRAY) ×4 IMPLANT
PAD ABD 8X10 STRL (GAUZE/BANDAGES/DRESSINGS) ×2 IMPLANT
PAD ARMBOARD 7.5X6 YLW CONV (MISCELLANEOUS) ×8 IMPLANT
PAD CAST 4YDX4 CTTN HI CHSV (CAST SUPPLIES) ×2 IMPLANT
PADDING CAST COTTON 4X4 STRL (CAST SUPPLIES) ×4
PADDING CAST COTTON 6X4 STRL (CAST SUPPLIES) ×4 IMPLANT
SET HNDPC FAN SPRY TIP SCT (DISPOSABLE) ×2 IMPLANT
SPONGE GAUZE 4X4 12PLY STER LF (GAUZE/BANDAGES/DRESSINGS) ×2 IMPLANT
STAPLER VISISTAT 35W (STAPLE) ×4 IMPLANT
STRIP CLOSURE SKIN 1/2X4 (GAUZE/BANDAGES/DRESSINGS) ×5 IMPLANT
SUCTION FRAZIER HANDLE 10FR (MISCELLANEOUS) ×2
SUCTION TUBE FRAZIER 10FR DISP (MISCELLANEOUS) ×2 IMPLANT
SUT VIC AB 0 CT1 27 (SUTURE) ×4
SUT VIC AB 0 CT1 27XBRD ANBCTR (SUTURE) ×2 IMPLANT
SUT VIC AB 1 CTX 36 (SUTURE) ×8
SUT VIC AB 1 CTX36XBRD ANBCTR (SUTURE) ×4 IMPLANT
SUT VIC AB 2-0 CT1 27 (SUTURE) ×8
SUT VIC AB 2-0 CT1 TAPERPNT 27 (SUTURE) ×4 IMPLANT
SUT VIC AB 3-0 X1 27 (SUTURE) ×4 IMPLANT
SYR CONTROL 10ML LL (SYRINGE) ×4 IMPLANT
TOWEL OR 17X24 6PK STRL BLUE (TOWEL DISPOSABLE) ×4 IMPLANT
TOWEL OR 17X26 10 PK STRL BLUE (TOWEL DISPOSABLE) ×4 IMPLANT
TRAY CATH 16FR W/PLASTIC CATH (SET/KITS/TRAYS/PACK) IMPLANT

## 2016-02-20 NOTE — Transfer of Care (Signed)
Immediate Anesthesia Transfer of Care Note  Patient: [name]  Procedure(s) Performed: Procedure(s): LEFT TOTAL KNEE ARTHROPLASTY WITH RIGHT KNEE INJECTION (Left) KNEE INJECTION (Right)  Patient Location: PACU  Anesthesia Type:General  Level of Consciousness: awake, alert  and oriented  Airway & Oxygen Therapy: Patient Spontanous Breathing and Patient connected to nasal cannula oxygen  Post-op Assessment: Report given to RN  Post vital signs: Reviewed and stable  Last Vitals:  Vitals:   02/20/16 0720 02/20/16 0721  BP:    Pulse: 71 70  Resp: (!) 23 (!) 22  Temp:      Last Pain:  Vitals:   02/20/16 0630  TempSrc: Oral  PainSc:       Patients Stated Pain Goal: 2 ([id number])  Complications: No apparent anesthesia complications

## 2016-02-20 NOTE — Evaluation (Signed)
Physical Therapy Evaluation Patient Details Name: [name] MRN: [id number] DOB: 10-01-1935 Today's Date: 02/20/2016   History of Present Illness  81 yo admitted for Lt TKA with post op Afib. PMHx: Mitral valve repair, CAD, HTN, Left RCR  Clinical Impression  Pt sleepy on arrival but able to answer all questions and attend to task. With transition to sitting EOB became dizzy and was not able to improve. Returned to supine. Pt educated for precautions, HEP, transfers, gait and plan. Pt with decreased activity tolerance, cardiopulmonary status, ROM, strength and function who will benefit from acute therapy to maximize mobility and independence. Pt with heel roll in place end of session.   BP 103/46 after return to supine, unable to get BP in sitting HR 65-80 sats 87-95% on RA, on 2L 94% end of session    Follow Up Recommendations Home health PT    Equipment Recommendations  Rolling walker with 5" wheels;3in1 (PT)    Recommendations for Other Services OT consult     Precautions / Restrictions Precautions Precautions: Knee;Fall Precaution Comments: check BP Restrictions Weight Bearing Restrictions: Yes LLE Weight Bearing: Weight bearing as tolerated      Mobility  Bed Mobility Overal bed mobility: Needs Assistance Bed Mobility: Sit to Supine;Supine to Sit     Supine to sit: Min guard Sit to supine: Min assist   General bed mobility comments: cues for sequence with use of rail and increased time to achieve sitting. EOB 8 min with pt unable to clear from dizziness and unable to get BP in sitting. Returned to supine with assist to elevate leg to surface, cues for sequence  Transfers                 General transfer comment: unable due to orthostasis  Ambulation/Gait                Stairs            Wheelchair Mobility    Modified Rankin (Stroke Patients Only)       Balance Overall balance assessment: Needs assistance   Sitting  balance-Leahy Scale: Good                                       Pertinent Vitals/Pain Pain Assessment: No/denies pain    Home Living Family/patient expects to be discharged to:: Private residence Living Arrangements: Spouse/significant other Available Help at Discharge: Family;Available 24 hours/day Type of Home: Mobile home Home Access: Stairs to enter   Entrance Stairs-Number of Steps: 4 Home Layout: One level Home Equipment: None      Prior Function Level of Independence: Independent               Hand Dominance        Extremity/Trunk Assessment        Lower Extremity Assessment Lower Extremity Assessment: LLE deficits/detail LLE Deficits / Details: decreased ROm and strength post operatively       Communication   Communication: No difficulties  Cognition Arousal/Alertness: Awake/alert Behavior During Therapy: WFL for tasks assessed/performed Overall Cognitive Status: Within Functional Limits for tasks assessed                      General Comments      Exercises Total Joint Exercises Quad Sets: AROM;Left;Supine;5 reps Heel Slides: AAROM;Left;Supine;10 reps Straight Leg Raises: AAROM;Left;10 reps;Supine   Assessment/Plan  PT Assessment Patient needs continued PT services  PT Problem List Decreased strength;Decreased mobility;Decreased activity tolerance;Decreased range of motion;Decreased knowledge of use of DME;Cardiopulmonary status limiting activity;Decreased knowledge of precautions          PT Treatment Interventions DME instruction;Gait training;Stair training;Balance training;Functional mobility training;Therapeutic exercise;Therapeutic activities;Patient/family education    PT Goals (Current goals can be found in the Care Plan section)  Acute Rehab PT Goals Patient Stated Goal: return home PT Goal Formulation: With patient Time For Goal Achievement: 02/27/16 Potential to Achieve Goals: Good    Frequency  7X/week   Barriers to discharge        Co-evaluation               End of Session   Activity Tolerance: Treatment limited secondary to medical complications (Comment) Patient left: in bed;with call bell/phone within reach Nurse Communication: Mobility status;Precautions;Weight bearing status         Time: 1255-1318 PT Time Calculation (min) (ACUTE ONLY): 23 min   Charges:   PT Evaluation $PT Eval Moderate Complexity: 1 Procedure     PT G Codes:        Nicolaas Savo B Lorine Iannaccone 03-09-16, 1:33 PM  Elwyn Reach, [place]

## 2016-02-20 NOTE — Anesthesia Postprocedure Evaluation (Addendum)
Anesthesia Post Note  Patient: [name]  Procedure(s) Performed: Procedure(s) (LRB): LEFT TOTAL KNEE ARTHROPLASTY WITH RIGHT KNEE INJECTION (Left) KNEE INJECTION (Right)  Patient location during evaluation: PACU Anesthesia Type: General and Regional Level of consciousness: awake and alert Pain management: pain level controlled Vital Signs Assessment: post-procedure vital signs reviewed and stable Respiratory status: spontaneous breathing, nonlabored ventilation, respiratory function stable and patient connected to nasal cannula oxygen Cardiovascular status: blood pressure returned to baseline and stable Postop Assessment: no signs of nausea or vomiting Anesthetic complications: yes Anesthetic complication details: #9 tooth chipped by CRNA during laryngoscopy-patient aware and anesthesia complicationsComments: Intra-op new onset atrial fibrillation.  12 lead EKG in PACU confirms A-fib.  Consult placed to Cardiology.  Cardiology to see in PACU.       Last Vitals:  Vitals:   02/20/16 1010 02/20/16 1024  BP: (!) 160/94 (!) 158/104  Pulse: 77 75  Resp: 16 17  Temp:      Last Pain:  Vitals:   02/20/16 1020  TempSrc:   PainSc: 6                  Catalina Gravel

## 2016-02-20 NOTE — Consult Note (Signed)
Admit date: 02/20/2016 Referring Physician  Dr.Turk Primary Physician Sherrie Mustache, MD Primary Cardiologist  Dr. Percival Spanish Reason for Consultation  AFIB post op  HPI: 81 year old male post mitral valve repair with brief 8 days of postoperative atrial fibrillation at that time spontaneously converted in 2003 here with postoperative atrial fibrillation following elective knee replacement.  He last saw Dr. Percival Spanish on 10/24/15. At that time no reported palpitations syncope, chest pain.  Currently he is laying down in bed in the PACU without any difficulties. No chest pain, no shortness of breath. Wife at bedside. Heart rate currently in the 70s to 80s with atrial fibrillation on telemetry monitor. He is on no current AV nodal blocking agent.  He has been taking clopidogrel at home because of peripheral vascular disease.  PMH:   Past Medical History:  Diagnosis Date  . Allergy   . Arthritis    knees & hips  . BPH (benign prostatic hyperplasia)   . Coronary artery disease excluded   . Diverticulosis   . Endocarditis, valve unspecified, unspecified cause   . GERD (gastroesophageal reflux disease)   . H/O mitral valve repair    Postoperative ring with postoperative atrial fibrillation  . Hiatal hernia 06/13/2002  . History of kidney stones    passed spontaneously x2   . Unspecified essential hypertension   . Weakness of both arms 07/19/2012    PSH:   Past Surgical History:  Procedure Laterality Date  . CATARACT EXTRACTION W/PHACO Right 05/12/2015   Procedure: CATARACT EXTRACTION PHACO AND INTRAOCULAR LENS PLACEMENT RIGHT EYE CDE=7.75;  Surgeon: Tonny Branch, MD;  Location: [facility];  Service: Ophthalmology;  Laterality: Right;  . CATARACT EXTRACTION W/PHACO Left 06/12/2015   Procedure: CATARACT EXTRACTION PHACO AND INTRAOCULAR LENS PLACEMENT (IOC);  Surgeon: Tonny Branch, MD;  Location: [facility];  Service: Ophthalmology;  Laterality: Left;  CDE:  13.17  . CHOLECYSTECTOMY    . KNEE  SURGERY Left   . MITRAL VALVE REPAIR    . MV repair     2003  . ROTATOR CUFF REPAIR Left   . VASECTOMY     Allergies:  Flomax [tamsulosin hcl] Prior to Admit Meds:   Prior to Admission medications   Medication Sig Start Date End Date Taking? Authorizing Provider  acetaminophen (TYLENOL) 500 MG tablet Take 1,000 mg by mouth every 6 (six) hours as needed.   Yes Historical Provider, MD  amLODipine (NORVASC) 2.5 MG tablet Take 2.5 mg by mouth daily before breakfast.  03/13/12  Yes Historical Provider, MD  clopidogrel (PLAVIX) 75 MG tablet Take 75 mg by mouth daily after supper.  03/23/12  Yes Historical Provider, MD  lisinopril (PRINIVIL,ZESTRIL) 10 MG tablet Take 10 mg by mouth daily before breakfast.    Yes Historical Provider, MD  meloxicam (MOBIC) 7.5 MG tablet Take 7.5 mg by mouth 2 (two) times daily.   Yes Historical Provider, MD  Multiple Vitamin (MULTIVITAMIN) tablet Take 1 tablet by mouth daily.     Yes Historical Provider, MD  naproxen sodium (ALEVE) 220 MG tablet Take 220 mg by mouth 2 (two) times daily as needed.   Yes Historical Provider, MD  omeprazole (PRILOSEC) 20 MG capsule Take 20 mg by mouth daily before breakfast.    Yes Historical Provider, MD  pravastatin (PRAVACHOL) 20 MG tablet TAKE 1 TABLET (20 MG TOTAL) BY MOUTH EVERY EVENING. 10/27/15  Yes Minus Breeding, MD   Fam HX:    Family History  Problem Relation Age of Onset  .  Congestive Heart Failure Father   . Heart disease Father   . Stroke Brother   . Stroke Sister   . Diabetes Mellitus II Brother   . Diabetes Mellitus II Sister   . Lung cancer Brother   . Colon cancer Neg Hx   . Esophageal cancer Neg Hx   . Rectal cancer Neg Hx   . Stomach cancer Neg Hx    Social HX:    Social History   Social History  . Marital status: Married    Spouse name: GLORIA  . Number of children: 2  . Years of education: N/A   Occupational History  .  Machine Music therapist     RETIRED   Social History Main Topics  . Smoking  status: Former Smoker    Packs/day: 3.00    Years: 35.00    Types: Cigarettes    Quit date: 02/16/1988  . Smokeless tobacco: Never Used     Comment:  Year Quit: 1990   . Alcohol use No  . Drug use: No  . Sexual activity: Not on file   Other Topics Concern  . Not on file   Social History Narrative  . No narrative on file     ROS:  All 11 ROS were addressed and are negative except what is stated in the HPI   Physical Exam: Blood pressure (!) 158/104, pulse 75, temperature 97.4 F (36.3 C), resp. rate 17, height 5\' 11"  (1.803 m), weight 240 lb (108.9 kg), SpO2 93 %.   General: Well developed, well nourished, in no acute distress Head: Eyes PERRLA, No xanthomas.   Normal cephalic and atramatic  Lungs:   Clear bilaterally to auscultation and percussion. Normal respiratory effort. No wheezes, no rales. Heart:   irreg irreg normal rate S1 S2 Pulses are 2+ & equal. No murmur, rubs, gallops.  No carotid bruit. No JVD.  No abdominal bruits. Prior MV repair scar noted Abdomen: Bowel sounds are positive, abdomen soft and non-tender without masses. No hepatosplenomegaly. Msk:  Back normal. Normal strength and tone for age. Extremities:  Post op knee Neuro: Alert and oriented X 3, non-focal, MAE x 4 GU: Deferred Rectal: Deferred Psych:  Good affect, responds appropriately      Labs: Lab Results  Component Value Date   WBC 8.7 02/11/2016   HGB 16.8 02/11/2016   HCT 47.3 02/11/2016   MCV 91.8 02/11/2016   PLT 233 02/11/2016    No results for input(s): NA, K, CL, CO2, BUN, CREATININE, CALCIUM, PROT, BILITOT, ALKPHOS, ALT, AST, GLUCOSE in the last 168 hours.  Invalid input(s): LABALBU No results for input(s): CKTOTAL, CKMB, TROPONINI in the last 72 hours. No results found for: CHOL, HDL, LDLCALC, TRIG No results found for: DDIMER   Radiology:  No results found. Personally viewed.  EKG:  02/20/16 at 10:22 AM postoperative-atrial fibrillation heart rate 78 bpm with no other  abnormalities. Personally viewed.   Echocardiogram 11/05/15  - Left ventricle: The cavity size was normal. Wall thickness was   increased in a pattern of mild LVH. Systolic function was normal.   The estimated ejection fraction was in the range of 55% to 60%.   Indeterminant diastolic function. - Aortic valve: There was no stenosis. - Mitral valve: Status post mitral valve repair. Mean gradient 4   mmHg with pressure half-time not significantly prolonged.   Probably minimal mitral stenosis. There was no significant   regurgitation. Pressure half-time: 128 ms. Mean gradient (D): 4   mm Hg.  Valve area by pressure half-time: 1.83 cm^2. - Left atrium: The atrium was mildly to moderately dilated. - Right ventricle: The cavity size was normal. Systolic function   was normal. - Right atrium: The atrium was mildly dilated. - Tricuspid valve: Peak RV-RA gradient (S): 27 mm Hg. - Pulmonary arteries: PA peak pressure: 30 mm Hg (S). - Inferior vena cava: The vessel was normal in size. The   respirophasic diameter changes were in the normal range (= 50%),   consistent with normal central venous pressure.  Impressions:  - Normal LV size with mild LV hypertrophy. EF 55-60%. Normal RV   size and systolic function. Biatrial enlargement. Status post   mitral valve repair without significant stenosis or   regurgitation.   ASSESSMENT/PLAN:    81 year old male post mitral valve repair in 2003 by Dr. Amador Cunas with previous brief postoperative atrial fibrillation in 2003 here with postoperative atrial fibrillation following knee replacement.  Atrial fibrillation paroxysmal  - Hopefully over the next 24-48 hours, he will spontaneously convert back to sinus rhythm. Atrial fibrillation secondary to increased adrenergic tone.  - If conversion does not take place automatically, he will need to be placed on anticoagulation, likely Coumadin because of his prior mitral valve repair, and considered for  cardioversion in the future. One could make an argument since he does not have a valve replacement/bioprosthetic valve that NOAC would be reasonable.  - I will add metoprolol 25 mg twice a day.  Carotid stenosis  - Reported minimal in the past  Essential hypertension  -   Obesity  - Continue to encourage weight loss  Note, he does not recall ever having endocarditis. This is listed in his past medical history.   Candee Furbish, MD  02/20/2016  10:51 AM

## 2016-02-20 NOTE — Anesthesia Procedure Notes (Addendum)
Procedure Name: Intubation Date/Time: 02/20/2016 7:40 AM Performed by: Barrington Ellison Pre-anesthesia Checklist: Patient identified, Emergency Drugs available, Suction available and Patient being monitored Patient Re-evaluated:Patient Re-evaluated prior to inductionOxygen Delivery Method: Circle System Utilized Preoxygenation: Pre-oxygenation with 100% oxygen Intubation Type: IV induction Ventilation: Mask ventilation without difficulty Laryngoscope Size: Mac and 3 Grade View: Grade II Tube type: Oral Tube size: 7.5 mm Number of attempts: 1 Airway Equipment and Method: Stylet and Oral airway Placement Confirmation: ETT inserted through vocal cords under direct vision,  positive ETCO2 and breath sounds checked- equal and bilateral Secured at: 22 cm Tube secured with: Tape Dental Injury: Dental damage  Comments: Small chip to front left tooth when pulling blade out

## 2016-02-20 NOTE — Care Management Note (Signed)
Case Management Note  Patient Details  Name: [name] MRN: [id number] Date of Birth: February 21, 1935  Subjective/Objective:  81 yr old gentleman s/p left total knee arthroplasty.                 Action/Plan: Case manager spoke with patient at the bedside concerning [place] and DME . Choice was offered for [facility], referral was called to Stevie Kern, [place] Liaison. Patient states he is borrowing a rolling waler, CM will request 3in1. Patient will have family support at discharge.      Expected Discharge Date:   02/22/16               Expected Discharge Plan:  [facility]  In-House Referral:  NA  Discharge planning Services  CM Consult  Post Acute Care Choice:  Durable Medical Equipment, Home Health Choice offered to:  Patient  DME Arranged:  3-N-1 DME Agency:  [facility]:  PT [facility] Agency:  [place]  Status of Service:  In process, will continue to follow  If discussed at Long Length of Stay Meetings, dates discussed:    Additional Comments:  Ninfa Meeker, RN 02/20/2016, 3:07 PM

## 2016-02-20 NOTE — Op Note (Signed)
Preop diagnosis: Left knee osteoarthritis  Postop diagnosis: Same  Procedure: Left cemented total knee arthroplasty. A cortisone injection into right knee under anesthesia  Surgeon: Rodell Perna M.D.  Assistant: Benjiman Core PA-C medically necessary and present for the entire procedure  Anesthesia: Gen. with preoperative abductor block and Marcaine and Exparel  Tourniquet time: Less than 1 hour [place] attending rotating platform #6 femur #8 tibia 5 mm spacer 41 mm 3 peg patella  Procedure; after induction general anesthesia timeout procedure proximal thigh tourniquet lateral post heel bump prepping from the tourniquet the tip the toes with DuraPrep preoperative Ancef prophylaxis standard draping was performed.  Sterile skin marker was used for midline incision planning with Betadine Steri-Drape sealing the skin impervious stockinette and Coban. Leg was wrapped an Esmarch tourniquet inflated to 350. Midline incision was made medial parapatellar incision was made patient had tricompartmental primary osteoarthritis of his knee. Prior to prepping and draping the opposite right knee was injected with the patient under anesthesia which she had requested with local anesthesia and 1 mL Depo-Medrol.  Patient had bone-on-bone changes in the medial compartment spurs removed off the femur and tibia medial and lateral. Menisci were resected and there was medial meniscal tearing. Intramedullary hole was drilled in the femur and initial 5 mm cut was made which seemed insufficient additional 2 mm were taken off. Tibia was cut using external alignment with the ankle clamp and spacer blocks were inserted and there was full extension. Due to the patient's medial compartment bone-on-bone changes deep fibers of the MCL were stripped off of the proximal tibia and large posterior spurs removed off the femur after chamfer cuts are made. Three-quarter curved osteotome was used for resection  of the posterior spurs. Anterior cruciate ligament PCL were resected. Trial sizing for the tibia was performed and a tibial component gave good fit on the cortex. He'll preparation was performed trials were inserted patella was resected 10 mm. There is full extension 5 mm spacer gave nice extension with good medial and lateral ligamentous balance.  Cement was vacuum mixed tibia was cemented first followed by femur insertion of the 5 mm Attune rotating platform spacer and then patello-held with the clamp. All excessive cement was removed. Components fit nicely. Cement was hard at 15 minutes tourniquet deflated hemostasis obtained in standard layered closure. Patient tolerated procedure well was transferred recovery. As the cement was setting up Marcaine and Exparel one-to-one mixture was injected 20 mL each into the deep tissue around the knee for postoperative analgesia. Instrument count needle count was correct. Ancef was given preoperatively prior to the procedure.

## 2016-02-20 NOTE — Progress Notes (Signed)
Orthopedic Tech Progress Note Patient Details:  [name] [birth year] [id number]  CPM Left Knee CPM Left Knee: On Left Knee Flexion (Degrees): 90 Left Knee Extension (Degrees): 0 Additional Comments: foot roll   [place] Pink 02/20/2016, 11:06 AM

## 2016-02-20 NOTE — Interval H&P Note (Signed)
History and Physical Interval Note:  02/20/2016 7:25 AM  [name]  has presented today for surgery, with the diagnosis of Osteoarthritis Left Knee  The various methods of treatment have been discussed with the patient and family. After consideration of risks, benefits and other options for treatment, the patient has consented to  Procedure(s): LEFT TOTAL KNEE ARTHROPLASTY WITH RIGHT KNEE INJECTION (Left) KNEE INJECTION (Right) as a surgical intervention .  The patient's history has been reviewed, patient examined, no change in status, stable for surgery.  I have reviewed the patient's chart and labs.  Questions were answered to the patient's satisfaction.     [name]

## 2016-02-21 LAB — BASIC METABOLIC PANEL
ANION GAP: 8 (ref 5–15)
BUN: 13 mg/dL (ref 6–20)
CO2: 29 mmol/L (ref 22–32)
Calcium: 8.5 mg/dL — ABNORMAL LOW (ref 8.9–10.3)
Chloride: 98 mmol/L — ABNORMAL LOW (ref 101–111)
Creatinine, Ser: 0.83 mg/dL (ref 0.61–1.24)
GFR calc Af Amer: >60 mL/min (ref >60)
GFR calc non Af Amer: >60 mL/min (ref >60)
Glucose, Bld: 168 mg/dL — ABNORMAL HIGH (ref 65–99)
POTASSIUM: 4.9 mmol/L (ref 3.5–5.1)
SODIUM: 135 mmol/L (ref 135–145)

## 2016-02-21 LAB — CBC
HCT: 41.5 % (ref 39.0–52.0)
Hemoglobin: 14.3 g/dL (ref 13.0–17.0)
MCH: 32.2 pg (ref 26.0–34.0)
MCHC: 34.5 g/dL (ref 30.0–36.0)
MCV: 93.5 fL (ref 78.0–100.0)
PLATELETS: 193 10*3/uL (ref 150–400)
RBC: 4.44 MIL/uL (ref 4.22–5.81)
RDW: 13.1 % (ref 11.5–15.5)
WBC: 13.5 10*3/uL — AB (ref 4.0–10.5)

## 2016-02-21 NOTE — Progress Notes (Signed)
aOrthopedic Tech Progress Note Patient Details:  [name] 1935-11-25 [id number]  Patient ID: [name], male   DOB: Feb 02, 1936, 81 y.o.   MRN: [id number] Applied cpm 0-60  Karolee Stamps 02/21/2016, 6:23 AM

## 2016-02-21 NOTE — Progress Notes (Signed)
Pt had a 12 beat run of VT on 02/21/16 at 0250. Pt states he was not aware of episode and VSS were taken/stable. Cardiology PA paged this AM and made aware. Will continue to monitor

## 2016-02-21 NOTE — Progress Notes (Signed)
Patient Name: [name] Date of Encounter: 02/21/2016  Primary Cardiologist: Dr [facility] Problem List     Active Problems:   Unilateral primary osteoarthritis, right knee   Arthritis of left knee     Subjective   No chest pain or dyspnea  Inpatient Medications    Scheduled Meds: . amLODipine  2.5 mg Oral QAC breakfast  . aspirin EC  325 mg Oral Q breakfast  . clopidogrel  75 mg Oral QPC supper  . docusate sodium  100 mg Oral BID  . lisinopril  10 mg Oral QAC breakfast  . metoprolol tartrate  25 mg Oral BID  . pravastatin  20 mg Oral q1800   Continuous Infusions: . sodium chloride 90 mL/hr at 02/20/16 1159   PRN Meds: acetaminophen **OR** acetaminophen, HYDROmorphone (DILAUDID) injection, menthol-cetylpyridinium **OR** phenol, methocarbamol **OR** methocarbamol (ROBAXIN)  IV, metoCLOPramide **OR** metoCLOPramide (REGLAN) injection, ondansetron **OR** ondansetron (ZOFRAN) IV, oxyCODONE, polyethylene glycol, sodium phosphate   Vital Signs    Vitals:   02/20/16 2049 02/21/16 0049 02/21/16 0300 02/21/16 0624  BP: 121/75 130/83 137/75 121/65  Pulse: 67 90 78 71  Resp: 18 17 17 16   Temp: 98.6 F (37 C) 100 F (37.8 C) 99.1 F (37.3 C) 99 F (37.2 C)  TempSrc: Oral Oral Oral Oral  SpO2: 90% 92% 92% 92%  Weight:      Height:        Intake/Output Summary (Last 24 hours) at 02/21/16 0857 Last data filed at 02/21/16 0626  Gross per 24 hour  Intake             3210 ml  Output             1450 ml  Net             1760 ml   Filed Weights   02/20/16 0627  Weight: 240 lb (108.9 kg)    Physical Exam    GEN: Well nourished, well developed, in no acute distress.  HEENT: Grossly normal.  Neck: Supple Cardiac: irregular Respiratory:  CTA GI: Soft, nontender, nondistended. MS: s/p knee replacement Skin: warm and dry, no rash. Neuro:  Strength and sensation are intact. Psych: AAOx3.  Normal affect.  Labs    CBC  Recent Labs  02/21/16 0747    WBC 13.5*  HGB 14.3  HCT 41.5  MCV 93.5  PLT 193     Telemetry    Atrial fibrillation with 12 beats NSVT- Personally Reviewed   Radiology    Dg Knee Left Port  Result Date: 02/20/2016 CLINICAL DATA:  Status post left knee replacement EXAM: PORTABLE LEFT KNEE - 1-2 VIEW COMPARISON:  None. FINDINGS: Left knee prosthesis is seen. No acute bony abnormality is noted. Air is noted in the soft tissues related to the recent surgery. No acute abnormality is noted. IMPRESSION: Status post left knee replacement.  No acute abnormality noted. Electronically Signed   By: Inez Catalina M.D.   On: 02/20/2016 13:36    Patient Profile     81 year old male with past medical history of mitral valve repair and now status post knee replacement with postoperative atrial fibrillation. Echocardiogram September 2017 showed normal LV function, prior mitral valve repair with no mitral regurgitation, mild to moderate left atrial enlargement and mild right atrial enlargement.  Assessment & Plan    1 Postoperative atrial fibrillation-patient remains in atrial fibrillation this morning. He is asymptomatic. Continue metoprolol for rate control. CHADSvasc 3. Would begin apixaban 5  mg BID when ok with surgery. If atrial fibrillation persists 4 weeks after initiating anticoagulation we could proceed with cardioversion at that point. LV function is normal.  2 History of mitral valve repair  3 status post knee replacement-management per orthopedics.  4 12 beats nonsustained ventricular tachycardia versus aberrancy-no symptoms. Continue beta blocker.  Signed, Kirk Ruths, MD  02/21/2016, 8:57 AM

## 2016-02-21 NOTE — Progress Notes (Signed)
Physical Therapy Treatment Patient Details Name: [name] MRN: [id number] DOB: 09/16/1935 Today's Date: 02/21/2016    History of Present Illness 81 yo admitted for Lt TKA with post op Afib. PMHx: Mitral valve repair, CAD, HTN, Left RCR    PT Comments    Patient sitting up at edge of bed with nursing on arrival, subjectively better vs yesterday's dizziness.  Unable to perform quad contraction on Left leg yet, use of knee immobilizer for standing and transfers.  MOD assist to stand, low endurance this morning, only few steps then to bedside chair.  Patient is anticipated to improve quickly, still needs more time and practice before going home directly, has 5 steps to enter home.  Plan to continue PT services to advance patient.  Follow Up Recommendations  Home health PT     Equipment Recommendations  Rolling walker with 5" wheels;3in1 (PT)    Recommendations for Other Services OT consult     Precautions / Restrictions Precautions Precautions: Knee;Fall Restrictions Weight Bearing Restrictions: Yes LLE Weight Bearing: Weight bearing as tolerated    Mobility  Bed Mobility Overal bed mobility: Needs Assistance             General bed mobility comments: Patient sitting at EoB with nursing on arrival  Transfers Overall transfer level: Needs assistance Equipment used: Rolling walker (2 wheeled) Transfers: Sit to/from [facility] Sit to Stand: Mod assist;Max assist Stand pivot transfers: Mod assist       General transfer comment: cues and assist for obtaining standing position and initial standing, cues and assist for safe walker technique.  Ambulation/Gait Ambulation/Gait assistance: Min assist Ambulation Distance (Feet): 8 Feet Assistive device: Rolling walker (2 wheeled) Gait Pattern/deviations: Step-to pattern;Antalgic         Stairs            Wheelchair Mobility    Modified Rankin (Stroke Patients Only)       Balance  Overall balance assessment: Needs assistance   Sitting balance-Leahy Scale: Good     Standing balance support: Bilateral upper extremity supported Standing balance-Leahy Scale: Poor                      Cognition Arousal/Alertness: Awake/alert Behavior During Therapy: WFL for tasks assessed/performed Overall Cognitive Status: Within Functional Limits for tasks assessed                      Exercises Total Joint Exercises Ankle Circles/Pumps: AROM;Both;10 reps;Seated Quad Sets: AROM;Both;10 reps;Seated Heel Slides: AAROM;Left;10 reps;Supine Straight Leg Raises: AAROM;Both;10 reps;Supine    General Comments General comments (skin integrity, edema, etc.): Ace wrap on L knee      Pertinent Vitals/Pain Pain Assessment: 0-10 Pain Score: 8  Pain Location: L knee, with activity Pain Descriptors / Indicators: Aching;Sore Pain Intervention(s): Limited activity within patient's tolerance;Monitored during session;Repositioned    Home Living                      Prior Function            PT Goals (current goals can now be found in the care plan section) Acute Rehab PT Goals Patient Stated Goal: return home PT Goal Formulation: With patient Time For Goal Achievement: 02/27/16 Potential to Achieve Goals: Good Progress towards PT goals: Progressing toward goals    Frequency    7X/week      PT Plan Current plan remains appropriate    Co-evaluation  End of Session Equipment Utilized During Treatment: Gait belt;Left knee immobilizer Activity Tolerance: Patient tolerated treatment well Patient left: in chair;with call bell/phone within reach     Time: 0900-0930 PT Time Calculation (min) (ACUTE ONLY): 30 min  Charges:  $Therapeutic Exercise: 8-22 mins $Therapeutic Activity: 8-22 mins                    G Codes:      Harlee Pursifull L 02-28-16, 9:42 AM

## 2016-02-21 NOTE — Progress Notes (Signed)
Physical Therapy Treatment Patient Details Name: [name] MRN: [id number] DOB: 21-Sep-1935 Today's Date: 02/21/2016    History of Present Illness 81 yo admitted for Lt TKA with post op Afib. PMHx: Mitral valve repair, CAD, HTN, Left RCR    PT Comments    Patient agreeable to therapy, motivated to do better, states he was able to work with OT earlier today.  Still unable to complete straight leg raise, used knee immobilizer during treatment.  MOD assist to stand, otherwise MIN assist to MIN guard assist for transfers and gait.  Slow pace, did use supplemental O2 due to holding breath tendency and low O2.  Patient is progressing, will continue to work with PT acutely.  Follow Up Recommendations  Home health PT     Equipment Recommendations  Rolling walker with 5" wheels;3in1 (PT)    Recommendations for Other Services OT consult     Precautions / Restrictions Precautions Precautions: Knee;Fall Precaution Comments: check BP; watch O2 Restrictions Weight Bearing Restrictions: Yes LLE Weight Bearing: Weight bearing as tolerated    Mobility  Bed Mobility Overal bed mobility: Needs Assistance Bed Mobility: Supine to Sit     Supine to sit: Min assist;HOB elevated Sit to supine: Min assist   General bed mobility comments: Trunk managment to get up, leg manangement to lie down  Transfers Overall transfer level: Needs assistance Equipment used: Rolling walker (2 wheeled) Transfers: Sit to/from [facility] Sit to Stand: Mod assist;Max assist Stand pivot transfers: Min assist       General transfer comment: cues and assist for obtaining standing position and initial standing, cues and assist for safe walker technique.  Ambulation/Gait Ambulation/Gait assistance: Min guard Ambulation Distance (Feet): 60 Feet Assistive device: Rolling walker (2 wheeled) Gait Pattern/deviations: Step-to pattern;Antalgic   Gait velocity interpretation: Below normal speed  for age/gender     Stairs            Wheelchair Mobility    Modified Rankin (Stroke Patients Only)       Balance Overall balance assessment: Needs assistance   Sitting balance-Leahy Scale: Good     Standing balance support: Bilateral upper extremity supported Standing balance-Leahy Scale: Poor Standing balance comment: external support needed for balance                    Cognition Arousal/Alertness: Awake/alert Behavior During Therapy: WFL for tasks assessed/performed Overall Cognitive Status: Within Functional Limits for tasks assessed                      Exercises Total Joint Exercises Goniometric ROM: visually 10-70 degrees    General Comments        Pertinent Vitals/Pain Pain Assessment: 0-10 Pain Score: 6  Pain Location: L knee  Pain Descriptors / Indicators: Aching;Sore Pain Intervention(s): Monitored during session    Home Living                      Prior Function            PT Goals (current goals can now be found in the care plan section) Acute Rehab PT Goals Patient Stated Goal: return home PT Goal Formulation: With patient Time For Goal Achievement: 02/27/16 Potential to Achieve Goals: Good Progress towards PT goals: Progressing toward goals    Frequency    7X/week      PT Plan Current plan remains appropriate    Co-evaluation  End of Session Equipment Utilized During Treatment: Gait belt;Left knee immobilizer;Oxygen Activity Tolerance: Patient tolerated treatment well Patient left: with call bell/phone within reach;in bed     Time: 1630-1700 PT Time Calculation (min) (ACUTE ONLY): 30 min  Charges:  $Gait Training: 8-22 mins $Therapeutic Activity: 8-22 mins                    G Codes:      [name] L 03-03-16, 5:10 PM

## 2016-02-21 NOTE — Progress Notes (Signed)
Patient ID: [name], male   DOB: 1935/09/07, 81 y.o.   MRN: [id number] Postoperative day 1 left total knee arthroplasty. Patient does complain of pain from his left knee. We'll start with physical therapy progressive ambulation.

## 2016-02-21 NOTE — Evaluation (Signed)
Occupational Therapy Evaluation Patient Details Name: [name] MRN: [id number] DOB: 07/27/1935 Today's Date: 02/21/2016    History of Present Illness 81 yo admitted for Lt TKA with post op Afib. PMHx: Mitral valve repair, CAD, HTN, Left RCR   Clinical Impression   Pt admitted with the above diagnoses and presents with below problem list. Pt will benefit from continued OT to address the below listed deficits and maximize independence with basic ADLs prior to d/c home. PTA pt was independent with ADLs. Pt is currently mod to max with LB ADLs and functional transfers; min A with pivotal steps in room. Spouse with back problems and unable to assist physically at home. Would benefit from further therapy in acute setting to increase functional independence prior to d/c home. Of note, pt desat to 80s at times on RA. Discussed breathing techniques and not to hold breath during transfers. Curiously his O2 dropped from mid 90s to mid 80s when pt briefly fell asleep in recliner at end of session and recovered quickly when pt woke back up. Reapplied supplemental O2 via [place] at 1.5L at end of session.       Follow Up Recommendations  Home health OT;Supervision/Assistance - 24 hour    Equipment Recommendations  3 in 1 bedside commode;Other (comment) (3n1 already delivered to room)    Recommendations for Other Services       Precautions / Restrictions Precautions Precautions: Knee;Fall Precaution Comments: check BP; watch O2 Restrictions Weight Bearing Restrictions: Yes LLE Weight Bearing: Weight bearing as tolerated      Mobility Bed Mobility Overal bed mobility: Needs Assistance Bed Mobility: Supine to Sit     Supine to sit: Min guard;Min assist;HOB elevated     General bed mobility comments: min A to advance LLE and to steady balance coming fully EOB. Pt holding onto therapist arm to facilitate power up to EOB. Discussed sleeping in recliner at home since spouse is not able to  physically assist at home.   Transfers Overall transfer level: Needs assistance Equipment used: Rolling walker (2 wheeled) Transfers: Sit to/from [facility] Sit to Stand: Mod assist;Max assist Stand pivot transfers: Mod assist       General transfer comment: cues and assist for obtaining standing position and initial standing, cues and assist for safe walker technique.    Balance Overall balance assessment: Needs assistance         Standing balance support: Bilateral upper extremity supported Standing balance-Leahy Scale: Poor Standing balance comment: external support needed for balance                            ADL Overall ADL's : Needs assistance/impaired Eating/Feeding: Set up;Sitting   Grooming: Set up;Sitting   Upper Body Bathing: Set up;Sitting   Lower Body Bathing: Sit to/from stand;Maximal assistance   Upper Body Dressing : Set up;Sitting   Lower Body Dressing: Maximal assistance;Sit to/from stand Lower Body Dressing Details (indicate cue type and reason): donned LB dressing during session, max A Toilet Transfer: Moderate assistance;BSC;Stand-pivot;RW Toilet Transfer Details (indicate cue type and reason): extra time and effort Toileting- Clothing Manipulation and Hygiene: Moderate assistance;Set up;Sitting/lateral lean;Sit to/from stand   Tub/ Shower Transfer: Walk-in shower;Moderate assistance;Ambulation;3 in 1;Rolling walker;Minimal assistance   Functional mobility during ADLs: Minimal assistance;Rolling walker General ADL Comments: Pt completed bed mobility, toilet transfer, and LB dressing as detailed above. Pivotal steps from [facility] to recliner. ADL education given to pt and spouse.  Vision     Perception     Praxis      Pertinent Vitals/Pain Pain Assessment: Faces Faces Pain Scale: Hurts whole lot Pain Location: L knee and sacrum Pain Descriptors / Indicators: Aching;Sore Pain Intervention(s): Limited activity  within patient's tolerance;Monitored during session;Repositioned     Hand Dominance     Extremity/Trunk Assessment Upper Extremity Assessment Upper Extremity Assessment: Overall WFL for tasks assessed;Generalized weakness   Lower Extremity Assessment Lower Extremity Assessment: Defer to PT evaluation       Communication Communication Communication: No difficulties   Cognition Arousal/Alertness: Awake/alert Behavior During Therapy: WFL for tasks assessed/performed Overall Cognitive Status: Within Functional Limits for tasks assessed                     General Comments       Exercises       Shoulder Instructions      Home Living Family/patient expects to be discharged to:: Private residence Living Arrangements: Spouse/significant other Available Help at Discharge: Family;Available 24 hours/day Type of Home: Mobile home Home Access: Stairs to enter Entrance Stairs-Number of Steps: 4   Home Layout: One level     Bathroom Shower/Tub: Occupational psychologist: Handicapped height     Home Equipment: None          Prior Functioning/Environment Level of Independence: Independent                 OT Problem List: Impaired balance (sitting and/or standing);Decreased knowledge of use of DME or AE;Decreased knowledge of precautions;Obesity;Pain   OT Treatment/Interventions: Self-care/ADL training;DME and/or AE instruction;Therapeutic activities;Patient/family education;Balance training    OT Goals(Current goals can be found in the care plan section) Acute Rehab OT Goals Patient Stated Goal: return home OT Goal Formulation: With patient/family Time For Goal Achievement: 02/28/16 Potential to Achieve Goals: Good ADL Goals Pt Will Perform Lower Body Bathing: with modified independence;with adaptive equipment;sit to/from stand Pt Will Perform Lower Body Dressing: with modified independence;with adaptive equipment;sit to/from stand Pt Will  Transfer to Toilet: with modified independence;ambulating;bedside commode Pt Will Perform Toileting - Clothing Manipulation and hygiene: with modified independence;sit to/from stand Pt Will Perform Tub/Shower Transfer: Shower transfer;ambulating;with supervision;rolling walker;3 in 1 Additional ADL Goal #1: Pt will complete bed mobility at supervision level to prepare for OOB ADLs.   OT Frequency: Min 2X/week   Barriers to D/C: Other (comment)  Spouse verbalized concern that she cannot physically assist at home due to her back issues.        Co-evaluation              End of Session Equipment Utilized During Treatment: Gait belt;Rolling walker;Left knee immobilizer CPM Left Knee CPM Left Knee: Off Additional Comments: rolled blanket under L heel Nurse Communication: Mobility status;Other (comment) (pain level, desat at times on RA left on 1.5L O2)  Activity Tolerance: Patient limited by pain;Other (comment) (variable O2 levels on RA, left on 1.5L O2) Patient left: in chair;with call bell/phone within reach;with family/visitor present   Time: [id number] OT Time Calculation (min): 38 min Charges:  OT General Charges $OT Visit: 1 Procedure OT Evaluation $OT Eval Low Complexity: 1 Procedure OT Treatments $Self Care/Home Management : 8-22 mins G-Codes:    Hortencia Pilar March 10, 2016, 1:40 PM

## 2016-02-22 LAB — BASIC METABOLIC PANEL
Anion gap: 6 (ref 5–15)
BUN: 22 mg/dL — ABNORMAL HIGH (ref 6–20)
CALCIUM: 8.4 mg/dL — AB (ref 8.9–10.3)
CO2: 32 mmol/L (ref 22–32)
CREATININE: 0.88 mg/dL (ref 0.61–1.24)
Chloride: 96 mmol/L — ABNORMAL LOW (ref 101–111)
GFR calc non Af Amer: >60 mL/min (ref >60)
Glucose, Bld: 145 mg/dL — ABNORMAL HIGH (ref 65–99)
Potassium: 3.8 mmol/L (ref 3.5–5.1)
SODIUM: 134 mmol/L — AB (ref 135–145)

## 2016-02-22 LAB — CBC
HCT: 36.1 % — ABNORMAL LOW (ref 39.0–52.0)
HEMOGLOBIN: 12.4 g/dL — AB (ref 13.0–17.0)
MCH: 32.3 pg (ref 26.0–34.0)
MCHC: 34.3 g/dL (ref 30.0–36.0)
MCV: 94 fL (ref 78.0–100.0)
PLATELETS: 167 10*3/uL (ref 150–400)
RBC: 3.84 MIL/uL — ABNORMAL LOW (ref 4.22–5.81)
RDW: 13.5 % (ref 11.5–15.5)
WBC: 13.4 10*3/uL — ABNORMAL HIGH (ref 4.0–10.5)

## 2016-02-22 NOTE — NC FL2 (Signed)
[place] LEVEL OF CARE SCREENING TOOL     IDENTIFICATION  Patient Name: [name] Birthdate: 1935-05-10 Sex: male Admission Date (Current Location): 02/20/2016  [facility] and [place] Number:  [employer] and Address:  [facility], [place] [address] [place], [place] [address]      Provider Number: [id number]  Attending Physician Name and Address:  [name], MD  Relative Name and Phone Number:  Connolly, Trezise [phone]  [phone]     Current Level of Care: SNF Recommended Level of Care: [place] Prior Approval Number:    Date Approved/Denied:   PASRR Number:  [id number]   Discharge Plan: SNF    Current Diagnoses: Patient Active Problem List   Diagnosis Date Noted  . Arthritis of left knee 02/20/2016  . Unilateral primary osteoarthritis, right knee 01/15/2016  . Weakness of both arms 07/19/2012  . Colon cancer screening 06/22/2012  . Unspecified venous (peripheral) insufficiency 05/05/2012  . Chronic venous insufficiency 04/07/2012  . Other malaise and fatigue 03/16/2012  . Disturbance of skin sensation 03/16/2012  . Carotid stenosis 08/16/2011  . Obesity 08/16/2011  . Coronary artery disease excluded   . H/O mitral valve repair   . Endocarditis, valve unspecified, unspecified cause   . Dizziness 12/16/2010  . Dyslipidemia 09/23/2010  . Essential hypertension 04/06/2007  . ATRIAL FIBRILLATION 04/06/2007  . OBSTRUCTIVE SLEEP APNEA 04/06/2007  . DYSPNEA 04/06/2007    Orientation RESPIRATION BLADDER Height & Weight     Self, Time, Place, Situation  O2 (nasal cannula 2L O2) Continent Weight: 240 lb (108.9 kg) Height:  5\' 11"  (180.3 cm)  BEHAVIORAL SYMPTOMS/MOOD NEUROLOGICAL BOWEL NUTRITION STATUS      Continent    AMBULATORY STATUS COMMUNICATION OF NEEDS Skin   Extensive Assist Verbally Surgical wounds (incisions )                       Personal Care Assistance Level of  Assistance  Bathing, Feeding, Dressing Bathing Assistance: Maximum assistance Feeding assistance: Limited assistance Dressing Assistance: Maximum assistance     Functional Limitations Info  Sight, Hearing, Speech Sight Info: Adequate Hearing Info: Adequate Speech Info: Adequate    SPECIAL CARE FACTORS FREQUENCY  PT (By licensed PT), OT (By licensed OT)     PT Frequency: 3xs a week OT Frequency: 3xs a week             Contractures      Additional Factors Info  Code Status, Allergies Code Status Info: FULL Allergies Info: Flomax Tamsulosin Hcl           Current Medications (02/22/2016):  This is the current hospital active medication list Current Facility-Administered Medications  Medication Dose Route Frequency Provider Last Rate Last Dose  . 0.9 %  sodium chloride infusion   Intravenous Continuous Lanae Crumbly, PA-C 90 mL/hr at 02/20/16 1159    . acetaminophen (TYLENOL) tablet 650 mg  650 mg Oral Q6H PRN Lanae Crumbly, PA-C   650 mg at 02/21/16 1613   Or  . acetaminophen (TYLENOL) suppository 650 mg  650 mg Rectal Q6H PRN Lanae Crumbly, PA-C      . amLODipine (NORVASC) tablet 2.5 mg  2.5 mg Oral QAC breakfast Lanae Crumbly, PA-C   2.5 mg at 02/22/16 [id number]  . aspirin EC tablet 325 mg  325 mg Oral Q breakfast Lanae Crumbly, PA-C   325 mg at 02/22/16 0930  .  clopidogrel (PLAVIX) tablet 75 mg  75 mg Oral QPC supper Lanae Crumbly, PA-C   75 mg at 02/21/16 1814  . docusate sodium (COLACE) capsule 100 mg  100 mg Oral BID Lanae Crumbly, PA-C   100 mg at 02/22/16 0930  . HYDROmorphone (DILAUDID) injection 0.5-1 mg  0.5-1 mg Intravenous Q3H PRN Lanae Crumbly, PA-C   1 mg at 02/20/16 2255  . lisinopril (PRINIVIL,ZESTRIL) tablet 10 mg  10 mg Oral QAC breakfast Lanae Crumbly, PA-C   10 mg at 02/22/16 0930  . menthol-cetylpyridinium (CEPACOL) lozenge 3 mg  1 lozenge Oral PRN Lanae Crumbly, PA-C       Or  . phenol (CHLORASEPTIC) mouth spray 1 spray  1 spray Mouth/Throat PRN Lanae Crumbly, PA-C      . methocarbamol (ROBAXIN) tablet 500 mg  500 mg Oral Q6H PRN Lanae Crumbly, PA-C   500 mg at 02/22/16 1247   Or  . methocarbamol (ROBAXIN) 500 mg in dextrose 5 % 50 mL IVPB  500 mg Intravenous Q6H PRN Lanae Crumbly, PA-C      . metoCLOPramide (REGLAN) tablet 5-10 mg  5-10 mg Oral Q8H PRN Lanae Crumbly, PA-C       Or  . metoCLOPramide (REGLAN) injection 5-10 mg  5-10 mg Intravenous Q8H PRN Lanae Crumbly, PA-C      . metoprolol tartrate (LOPRESSOR) tablet 25 mg  25 mg Oral BID Jerline Pain, MD   25 mg at 02/22/16 0930  . ondansetron (ZOFRAN) tablet 4 mg  4 mg Oral Q6H PRN Lanae Crumbly, PA-C       Or  . ondansetron [facility]) injection 4 mg  4 mg Intravenous Q6H PRN Lanae Crumbly, PA-C      . oxyCODONE (Oxy IR/ROXICODONE) immediate release tablet 5-10 mg  5-10 mg Oral Q4H PRN Lanae Crumbly, PA-C   10 mg at 02/22/16 1247  . polyethylene glycol (MIRALAX / GLYCOLAX) packet 17 g  17 g Oral Daily PRN Lanae Crumbly, PA-C      . pravastatin (PRAVACHOL) tablet 20 mg  20 mg Oral q1800 Lanae Crumbly, PA-C   20 mg at 02/21/16 1814  . sodium phosphate (FLEET) 7-19 GM/118ML enema 1 enema  1 enema Rectal Once PRN Lanae Crumbly, PA-C         Discharge Medications: Please see discharge summary for a list of discharge medications.  Relevant Imaging Results:  Relevant Lab Results:   Additional Information SS#: [id number]  Raymondo Band, LCSWA

## 2016-02-22 NOTE — Clinical Social Work Placement (Addendum)
   CLINICAL SOCIAL WORK PLACEMENT  NOTE  Date:  02/22/2016  Patient Details  Name: DAVRON PANCIERA MRN: [id number] Date of Birth: 01-01-1936  Clinical Social Work is seeking post-discharge placement for this patient at the [place] level of care (*CSW will initial, date and re-position this form in  chart as items are completed):  Yes   Patient/family provided with [place] Work Department's list of facilities offering this level of care within the geographic area requested by the patient (or if unable, by the patient's family).  Yes   Patient/family informed of their freedom to choose among providers that offer the needed level of care, that participate in Medicare, Medicaid or managed care program needed by the patient, have an available bed and are willing to accept the patient.  Yes   Patient/family informed of [employer]'s ownership interest in [facility] and [facility], as well as of the fact that they are under no obligation to receive care at these facilities.  PASRR submitted to EDS on 02/22/16     PASRR number received on 02/22/16     Existing PASRR number confirmed on       FL2 transmitted to all facilities in geographic area requested by pt/family on 02/22/16     FL2 transmitted to all facilities within larger geographic area on       Patient informed that his/her managed care company has contracts with or will negotiate with certain facilities, including the following:            Patient/family informed of bed offers received.  Patient chooses bed at [facility]    Physician recommends and patient chooses bed at      Patient to be transferred to [facility] on 02/23/16.  Patient to be transferred to facility by [facility]     Patient family notified on 02/23/16 of transfer.  Name of family member notified:        PHYSICIAN Please sign FL2     Additional Comment:    Barbette Or,  [place]  [place], [place]

## 2016-02-22 NOTE — Progress Notes (Signed)
Physical Therapy Treatment Patient Details Name: [name] MRN: [id number] DOB: 11-Jul-1935 Today's Date: 02/22/2016    History of Present Illness 81 yo admitted for Lt TKA with post op Afib. PMHx: Mitral valve repair, CAD, HTN, Left RCR    PT Comments    Pt making slow progress. Pt still with poor quad control and requiring knee immobilizer for stability. Pt still demonstrating decr SpO2 on RA with activity. Pt requiring assist for any OOB activity. Pt reports his wife isn't able to physically assist pt due to her own health problems.  Follow Up Recommendations  SNF     Equipment Recommendations  Rolling walker with 5" wheels;3in1 (PT)    Recommendations for Other Services OT consult     Precautions / Restrictions Precautions Precautions: Knee;Fall Precaution Comments: watch O2 Required Braces or Orthoses: Knee Immobilizer - Left Restrictions Weight Bearing Restrictions: Yes LLE Weight Bearing: Weight bearing as tolerated    Mobility  Bed Mobility Overal bed mobility: Needs Assistance Bed Mobility: Supine to Sit     Supine to sit: HOB elevated;Supervision     General bed mobility comments: Incr time and use of bed rail  Transfers Overall transfer level: Needs assistance Equipment used: Rolling walker (2 wheeled) Transfers: Sit to/from Stand Sit to Stand: Mod assist         General transfer comment: Verbal cues for hand placement. Assist to bring hips up.  Ambulation/Gait Ambulation/Gait assistance: Min guard Ambulation Distance (Feet): 110 Feet Assistive device: Rolling walker (2 wheeled) Gait Pattern/deviations: Antalgic;Step-through pattern Gait velocity: decr Gait velocity interpretation: Below normal speed for age/gender General Gait Details: Assist for safety. Amb on RA. Unable to pick up SpO2 reading while amb. SpO2 88% when sitting after amb. Dyspnea 3/4. SpO2 returned to 91% after 1 minute.   Stairs            Wheelchair Mobility     Modified Rankin (Stroke Patients Only)       Balance Overall balance assessment: Needs assistance Sitting-balance support: No upper extremity supported Sitting balance-Leahy Scale: Good     Standing balance support: No upper extremity supported;During functional activity Standing balance-Leahy Scale: Fair Standing balance comment: able to stand                     Cognition Arousal/Alertness: Awake/alert Behavior During Therapy: WFL for tasks assessed/performed Overall Cognitive Status: Within Functional Limits for tasks assessed                      Exercises Total Joint Exercises Ankle Circles/Pumps: AROM;Both;10 reps;Supine Quad Sets: AROM;Both;10 reps;Supine Heel Slides: AAROM;Left;10 reps;Supine Straight Leg Raises: AAROM;Left;10 reps;Supine Long Arc Quad: AAROM;Left;5 reps;Seated Knee Flexion: AAROM;Left;5 reps;Seated Goniometric ROM: 10-75 degrees    General Comments        Pertinent Vitals/Pain Pain Assessment: 0-10 Pain Score: 5  Pain Location: L knee  Pain Descriptors / Indicators: Aching;Sore Pain Intervention(s): Limited activity within patient's tolerance;Monitored during session;Premedicated before session    Home Living                      Prior Function            PT Goals (current goals can now be found in the care plan section) Progress towards PT goals: Progressing toward goals (slowly)    Frequency    7X/week      PT Plan Discharge plan needs to be updated    Co-evaluation  End of Session Equipment Utilized During Treatment: Gait belt;Left knee immobilizer Activity Tolerance: Patient tolerated treatment well Patient left: with call bell/phone within reach;in chair     Time: [id number] PT Time Calculation (min) (ACUTE ONLY): 45 min  Charges:  $Gait Training: 23-37 mins $Therapeutic Exercise: 8-22 mins                    G CodesShary Decamp [facility] 03-02-16, 9:41 AM [employer] PT [phone]

## 2016-02-22 NOTE — Clinical Social Work Note (Signed)
Clinical Social Work Assessment  Patient Details  Name: [name] MRN: [id number] Date of Birth: 1935-10-01  Date of referral:  02/22/16               Reason for consult:  Facility Placement                Permission sought to share information with:  Family Supports Permission granted to share information::  Yes, Verbal Permission Granted  Name::     Breyson Kelm  Relationship::  Spouse  Contact Information:  [phone]  Housing/Transportation Living arrangements for the past 2 months:  [place] of Information:  Patient, Adult Children Patient Interpreter Needed:  None Criminal Activity/Legal Involvement Pertinent to Current Situation/Hospitalization:  No - Comment as needed Significant Relationships:  Adult Children, Spouse Lives with:  Spouse Do you feel safe going back to the place where you live?  Yes Need for family participation in patient care:  Yes (Comment)  Care giving concerns:  Patient son is at bedside and wife on the way.  Patient and family aware that patient can not receive adequate care at home and feel SNF is most appropriate.   Social Worker assessment / plan:  Holiday representative met with patient and patient son at bedside to offer support and discuss patient needs at discharge.  Patient deferred conversation to patient son who states that patient lives at home with his spouse prior to admission.  Patient and patient son both verbalize agreement to SNF placement at discharge.  Patient and family express interest in [facility] with first choice being [facility] and next would be [facility].  CSW initiated SNF search and will follow up with patient and family regarding available bed offers.  CSW remains available for support and to facilitate patient discharge needs once medically stable.  Employment status:  Retired Nurse, adult PT Recommendations:  [place] / Referral  to community resources:  [place]  Patient/Family's Response to care:  Patient and family verbalize understanding of CSW role and appreciation for support and concern.  Patient and family agreeable with SNF placement and plan to go by ambulance.  Patient/Family's Understanding of and Emotional Response to Diagnosis, Current Treatment, and Prognosis:  Patient understanding, however unable to determine patient emotional view point due to deferred conversation.  Emotional Assessment Appearance:  Appears stated age Attitude/Demeanor/Rapport:   (Patient deferred to son) Affect (typically observed):  Appropriate, Calm Orientation:  Oriented to Self, Oriented to Place, Oriented to  Time, Oriented to Situation Alcohol / Substance use:  Not Applicable Psych involvement (Current and /or in the community):  No (Comment)  Discharge Needs  Concerns to be addressed:  Discharge Planning Concerns Readmission within the last 30 days:  No Current discharge risk:  Dependent with Mobility Barriers to Discharge:  Continued Medical Work up  [employer], [place]

## 2016-02-22 NOTE — Progress Notes (Signed)
Patient Name: [name] Date of Encounter: 02/22/2016  Primary Cardiologist: Dr [facility] Problem List     Active Problems:   Unilateral primary osteoarthritis, right knee   Arthritis of left knee     Subjective   No chest pain or dyspnea  Inpatient Medications    Scheduled Meds: . amLODipine  2.5 mg Oral QAC breakfast  . aspirin EC  325 mg Oral Q breakfast  . clopidogrel  75 mg Oral QPC supper  . docusate sodium  100 mg Oral BID  . lisinopril  10 mg Oral QAC breakfast  . metoprolol tartrate  25 mg Oral BID  . pravastatin  20 mg Oral q1800   Continuous Infusions: . sodium chloride 90 mL/hr at 02/20/16 1159   PRN Meds: acetaminophen **OR** acetaminophen, HYDROmorphone (DILAUDID) injection, menthol-cetylpyridinium **OR** phenol, methocarbamol **OR** methocarbamol (ROBAXIN)  IV, metoCLOPramide **OR** metoCLOPramide (REGLAN) injection, ondansetron **OR** ondansetron (ZOFRAN) IV, oxyCODONE, polyethylene glycol, sodium phosphate   Vital Signs    Vitals:   02/21/16 0300 02/21/16 0624 02/21/16 2111 02/22/16 0656  BP: 137/75 121/65 111/62 123/79  Pulse: 78 71 73 78  Resp: 17 16 16 16   Temp: 99.1 F (37.3 C) 99 F (37.2 C) 99.7 F (37.6 C) 98.9 F (37.2 C)  TempSrc: Oral Oral Oral Oral  SpO2: 92% 92% 93% 98%  Weight:      Height:        Intake/Output Summary (Last 24 hours) at 02/22/16 0833 Last data filed at 02/22/16 0656  Gross per 24 hour  Intake                0 ml  Output              700 ml  Net             -700 ml   Filed Weights   02/20/16 0627  Weight: 240 lb (108.9 kg)    Physical Exam    GEN: Well nourished, well developed, in no acute distress.  HEENT: Grossly normal.  Neck: Supple Cardiac: irregular, no murmur Respiratory:  CTA GI: Soft, nontender, nondistended. MS: s/p knee replacement Skin: warm and dry, no rash. Neuro:  Strength and sensation are intact. Psych: AAOx3.  Normal affect.  Labs    CBC  Recent Labs  02/21/16 0747 02/22/16 0419  WBC 13.5* 13.4*  HGB 14.3 12.4*  HCT 41.5 36.1*  MCV 93.5 94.0  PLT 193 167     Telemetry    Atrial fibrillation with PVCs or aberrantly conducted beats- Personally Reviewed   Radiology    Dg Knee Left Port  Result Date: 02/20/2016 CLINICAL DATA:  Status post left knee replacement EXAM: PORTABLE LEFT KNEE - 1-2 VIEW COMPARISON:  None. FINDINGS: Left knee prosthesis is seen. No acute bony abnormality is noted. Air is noted in the soft tissues related to the recent surgery. No acute abnormality is noted. IMPRESSION: Status post left knee replacement.  No acute abnormality noted. Electronically Signed   By: Inez Catalina M.D.   On: 02/20/2016 13:36    Patient Profile     81 year old male with past medical history of mitral valve repair and now status post knee replacement with postoperative atrial fibrillation. Echocardiogram September 2017 showed normal LV function, prior mitral valve repair with no mitral regurgitation, mild to moderate left atrial enlargement and mild right atrial enlargement.  Assessment & Plan    1 Postoperative atrial fibrillation-patient remains in atrial fibrillation this morning. He  is asymptomatic. Continue metoprolol for rate control. CHADSvasc 3. Would begin apixaban 5 mg BID when ok with surgery (DC ASA and plavix when apixaban initiated). If atrial fibrillation persists 4 weeks after initiating anticoagulation we could proceed with cardioversion at that point. LV function is normal.  2 History of mitral valve repair  3 status post knee replacement-management per orthopedics.  4 12 beats nonsustained ventricular tachycardia versus aberrancy-no further VT; no symptoms. Continue beta blocker.  Signed, Kirk Ruths, MD  02/22/2016, 8:33 AM

## 2016-02-22 NOTE — Progress Notes (Signed)
Physical Therapy Treatment Patient Details Name: [name] MRN: [id number] DOB: [birth year] Today's Date: 02/22/2016    History of Present Illness 81 yo admitted for Lt TKA with post op Afib. PMHx: Mitral valve repair, CAD, HTN, Left RCR    PT Comments    Pt's progress continues to be slow. Fatigues quickly and requiring assist for all mobility.  Follow Up Recommendations  SNF     Equipment Recommendations  Rolling walker with 5" wheels;3in1 (PT)    Recommendations for Other Services OT consult     Precautions / Restrictions Precautions Precautions: Knee;Fall Precaution Comments: watch O2 Required Braces or Orthoses: Knee Immobilizer - Left Restrictions Weight Bearing Restrictions: Yes LLE Weight Bearing: Weight bearing as tolerated    Mobility  Bed Mobility Overal bed mobility: Needs Assistance Bed Mobility: Sit to Supine     Supine to sit: HOB elevated;Supervision Sit to supine: Min assist   General bed mobility comments: Assist to bring LLE up into bed  Transfers Overall transfer level: Needs assistance Equipment used: Rolling walker (2 wheeled) Transfers: Sit to/from Stand Sit to Stand: Min assist Stand pivot transfers: Min assist       General transfer comment: Assist for balance  Ambulation/Gait Ambulation/Gait assistance: Min guard Ambulation Distance (Feet): 60 Feet Assistive device: Rolling walker (2 wheeled) Gait Pattern/deviations: Antalgic;Step-to pattern;Decreased step length - right;Decreased step length - left Gait velocity: decr Gait velocity interpretation: Below normal speed for age/gender General Gait Details: Assist for safety. Amb on 2L of O2. Dyspnea 3/4. SpO2 92% after amb   Stairs            Wheelchair Mobility    Modified Rankin (Stroke Patients Only)       Balance Overall balance assessment: Needs assistance Sitting-balance support: No upper extremity supported Sitting balance-Leahy Scale: Good      Standing balance support: No upper extremity supported;During functional activity Standing balance-Leahy Scale: Fair Standing balance comment: able to stand to urinate                    Cognition Arousal/Alertness: Awake/alert Behavior During Therapy: WFL for tasks assessed/performed Overall Cognitive Status: Within Functional Limits for tasks assessed                      Exercises Total Joint Exercises Ankle Circles/Pumps: AROM;Both;10 reps;Supine Quad Sets: AROM;Both;10 reps;Supine Heel Slides: AAROM;Left;10 reps;Supine Straight Leg Raises: AAROM;Left;10 reps;Supine Long Arc Quad: AAROM;Left;5 reps;Seated Knee Flexion: AAROM;Left;5 reps;Seated Goniometric ROM: 10-75 degrees    General Comments        Pertinent Vitals/Pain Pain Assessment: 0-10 Pain Score: 6  Pain Location: L knee  Pain Descriptors / Indicators: Aching;Sore Pain Intervention(s): Limited activity within patient's tolerance;Monitored during session    Home Living                      Prior Function            PT Goals (current goals can now be found in the care plan section) Progress towards PT goals: Progressing toward goals (very slowly)    Frequency    7X/week      PT Plan Current plan remains appropriate    Co-evaluation             End of Session Equipment Utilized During Treatment: Gait belt;Left knee immobilizer Activity Tolerance: Patient limited by fatigue Patient left: with call bell/phone within reach;in bed (LLE extended with heel in foam block)  Time: 1200-1219 PT Time Calculation (min) (ACUTE ONLY): 19 min  Charges:  $Gait Training: 8-22 mins $Therapeutic Exercise: 8-22 mins                    G Codes:      Shary Decamp [facility] 03-06-2016, 12:26 PM [employer] PT [phone]

## 2016-02-22 NOTE — Progress Notes (Signed)
Patient ID: [name], male   DOB: [birth year], 81 y.o.   MRN: [id number] Postoperative day 2 total knee arthroplasty. Patient still requires full assistance to get from sitting to standing. Patient states that his wife at home also requires assistance and she will not be able to assist him at home. Orders written for evaluation for skilled nursing placement.

## 2016-02-22 NOTE — Progress Notes (Signed)
Occupational Therapy Treatment Patient Details Name: [name] MRN: [id number] DOB: 03/20/1935 Today's Date: 02/22/2016    History of present illness 81 yo admitted for Lt TKA with post op Afib. PMHx: Mitral valve repair, CAD, HTN, Left RCR   OT comments  o2 in seated resting position: 96% on 2L o2 after seated LB dressing tasks with bending forward 93% o2 after approx. 5 forward and pivotal steps and scooting back in chair 83% o2 with cues for breathing and approx. 25 sec. Seated back to 93%   Pt. Able to complete seated LB dressing tasks and simulated bsc transfer.  See above for o2 readings throughout session.  Agree with need for SNF as pt. Reports wife unable to assist physically and pt. Would benefit from increased strengthening and o2 management prior to d/c home.    Follow Up Recommendations  SNF    Equipment Recommendations  3 in 1 bedside commode    Recommendations for Other Services      Precautions / Restrictions Precautions Precautions: Knee;Fall Precaution Comments: watch O2 Required Braces or Orthoses: Knee Immobilizer - Left Restrictions Weight Bearing Restrictions: Yes LLE Weight Bearing: Weight bearing as tolerated       Mobility Bed Mobility Overal bed mobility: Needs Assistance Bed Mobility: Supine to Sit     Supine to sit: HOB elevated;Supervision     General bed mobility comments: pt. in recliner beginning and end of session  Transfers Overall transfer level: Needs assistance Equipment used: Rolling walker (2 wheeled) Transfers: Sit to/from [facility] Sit to Stand: Min assist Stand pivot transfers: Min assist       General transfer comment: Verbal cues for hand placement.     Balance Overall balance assessment: Needs assistance Sitting-balance support: No upper extremity supported Sitting balance-Leahy Scale: Good     Standing balance support: No upper extremity supported;During functional activity Standing  balance-Leahy Scale: Fair Standing balance comment: able to stand                    ADL Overall ADL's : Needs assistance/impaired                     Lower Body Dressing: Sitting/lateral leans;Set up;Maximal assistance Lower Body Dressing Details (indicate cue type and reason): able to reach RLE to don/doff sock, max a for L sock, introduce A/E next session Toilet Transfer: Minimal assistance Toilet Transfer Details (indicate cue type and reason): simulated distance for bsc including a pivot turn and backwards steps to recliner.  (limited by o2 today) Toileting- Clothing Manipulation and Hygiene: Moderate assistance;Set up;Sitting/lateral lean;Sit to/from stand Toileting - Clothing Manipulation Details (indicate cue type and reason): simulated during functional mobility     Functional mobility during ADLs: Minimal assistance;Rolling walker        Vision                     Perception     Praxis      Cognition   Behavior During Therapy: WFL for tasks assessed/performed Overall Cognitive Status: Within Functional Limits for tasks assessed                       Extremity/Trunk Assessment               Exercises Total Joint Exercises Ankle Circles/Pumps: AROM;Both;10 reps;Supine Quad Sets: AROM;Both;10 reps;Supine Heel Slides: AAROM;Left;10 reps;Supine Straight Leg Raises: AAROM;Left;10 reps;Supine Long Arc Quad: AAROM;Left;5 reps;Seated Knee Flexion:  AAROM;Left;5 reps;Seated Goniometric ROM: 10-75 degrees   Shoulder Instructions       General Comments      Pertinent Vitals/ Pain       Pain Assessment: 0-10 Pain Score: 5  Pain Location: L knee Pain Descriptors / Indicators: Aching Pain Intervention(s): Limited activity within patient's tolerance;Monitored during session;Repositioned  Home Living                                          Prior Functioning/Environment              Frequency  Min  2X/week        Progress Toward Goals  OT Goals(current goals can now be found in the care plan section)        Plan Discharge plan needs to be updated    Co-evaluation                 End of Session Equipment Utilized During Treatment: Gait belt;Rolling walker;Left knee immobilizer CPM Left Knee CPM Left Knee: Off   Activity Tolerance Other (comment) (limited by o2 fluctuations)   Patient Left in chair;with call bell/phone within reach   Nurse Communication Other (comment) (reviewed o2 findings with cna to report to rn)        [id number] OT Time Calculation (min): 20 min  Charges: OT General Charges $OT Visit: 1 Procedure OT Treatments $Self Care/Home Management : 8-22 mins  Janice Coffin, COTA/L 02/22/2016, 11:21 AM

## 2016-02-23 ENCOUNTER — Telehealth: Payer: Self-pay | Admitting: Cardiology

## 2016-02-23 LAB — CBC
HCT: 36 % — ABNORMAL LOW (ref 39.0–52.0)
HEMOGLOBIN: 12.5 g/dL — AB (ref 13.0–17.0)
MCH: 32.2 pg (ref 26.0–34.0)
MCHC: 34.7 g/dL (ref 30.0–36.0)
MCV: 92.8 fL (ref 78.0–100.0)
Platelets: 196 10*3/uL (ref 150–400)
RBC: 3.88 MIL/uL — AB (ref 4.22–5.81)
RDW: 13.3 % (ref 11.5–15.5)
WBC: 10.5 10*3/uL (ref 4.0–10.5)

## 2016-02-23 LAB — BASIC METABOLIC PANEL
Anion gap: 8 (ref 5–15)
BUN: 17 mg/dL (ref 6–20)
CHLORIDE: 98 mmol/L — AB (ref 101–111)
CO2: 29 mmol/L (ref 22–32)
CREATININE: 0.8 mg/dL (ref 0.61–1.24)
Calcium: 8.4 mg/dL — ABNORMAL LOW (ref 8.9–10.3)
GFR calc non Af Amer: >60 mL/min (ref >60)
Glucose, Bld: 207 mg/dL — ABNORMAL HIGH (ref 65–99)
Potassium: 3.6 mmol/L (ref 3.5–5.1)
SODIUM: 135 mmol/L (ref 135–145)

## 2016-02-23 MED ORDER — METHOCARBAMOL 500 MG PO TABS: 500.0000 mg | ORAL_TABLET | Freq: Three times a day (TID) | ORAL | 0 refills | Status: DC | PRN

## 2016-02-23 MED ORDER — ASPIRIN 325 MG PO TBEC: 325.0000 mg | DELAYED_RELEASE_TABLET | Freq: Once | ORAL | 0 refills | Status: AC

## 2016-02-23 MED ORDER — OXYCODONE-ACETAMINOPHEN 5-325 MG PO TABS: 1.0000 | ORAL_TABLET | Freq: Four times a day (QID) | ORAL | 0 refills | Status: DC | PRN

## 2016-02-23 NOTE — Telephone Encounter (Signed)
Does not need encounter

## 2016-02-23 NOTE — Progress Notes (Signed)
Physical Therapy Treatment Patient Details Name: STIVEN REISENAUER MRN: [id number] DOB: 02/10/1936 Today's Date: 02/23/2016    History of Present Illness 81 yo admitted for Lt TKA with post op Afib. PMHx: Mitral valve repair, CAD, HTN, Left RCR    PT Comments    Pt performed increased mobility.  Plan to d/c to SNF awaiting PTAR for transport.    Follow Up Recommendations  SNF     Equipment Recommendations  Rolling walker with 5" wheels;3in1 (PT)    Recommendations for Other Services       Precautions / Restrictions Precautions Precautions: Knee;Fall Required Braces or Orthoses: Knee Immobilizer - Left Restrictions Weight Bearing Restrictions: Yes LLE Weight Bearing: Weight bearing as tolerated    Mobility  Bed Mobility Overal bed mobility: Needs Assistance Bed Mobility: Sit to Supine     Supine to sit: Supervision Sit to supine: Modified independent (Device/Increase time)   General bed mobility comments: Cues for hip position during supine to sit.    Transfers Overall transfer level: Needs assistance Equipment used: Rolling walker (2 wheeled) Transfers: Sit to/from Stand Sit to Stand: Min guard Stand pivot transfers: Min guard       General transfer comment: Min guard for safety.    Ambulation/Gait Ambulation/Gait assistance: Min guard Ambulation Distance (Feet): 85 Feet Assistive device: Rolling walker (2 wheeled) Gait Pattern/deviations: Step-through pattern;Trunk flexed Gait velocity: decr Gait velocity interpretation: Below normal speed for age/gender General Gait Details: Cues for posture, adjusted RW during gait training.     Stairs            Wheelchair Mobility    Modified Rankin (Stroke Patients Only)       Balance Overall balance assessment: Needs assistance Sitting-balance support: No upper extremity supported Sitting balance-Leahy Scale: Normal       Standing balance-Leahy Scale: Fair Standing balance comment: with RW  support                    Cognition Arousal/Alertness: Awake/alert Behavior During Therapy: WFL for tasks assessed/performed Overall Cognitive Status: Within Functional Limits for tasks assessed                      Exercises Total Joint Exercises Ankle Circles/Pumps: AROM;Both;10 reps;Supine Quad Sets: AROM;10 reps;Supine;Left Towel Squeeze: AROM;Both;10 reps;Supine Short Arc Quad: AROM;Left;10 reps;Supine Heel Slides: AROM;Left;10 reps;Supine Hip ABduction/ADduction: AROM;Left;10 reps;Supine Straight Leg Raises: AROM;Left;10 reps;AAROM;Supine Long Arc Quad: AAROM;Left;Seated;10 reps Goniometric ROM: 4-75 degrees.      General Comments        Pertinent Vitals/Pain Pain Assessment: Faces Pain Score: 6  Faces Pain Scale: Hurts whole lot Pain Location: L knee with movement during supine exercises Pain Descriptors / Indicators: Grimacing;Moaning Pain Intervention(s): Ice applied;Monitored during session;Repositioned    Home Living                      Prior Function            PT Goals (current goals can now be found in the care plan section) Acute Rehab PT Goals Patient Stated Goal: return home Potential to Achieve Goals: Good Progress towards PT goals: Progressing toward goals    Frequency    7X/week      PT Plan Current plan remains appropriate    Co-evaluation             End of Session Equipment Utilized During Treatment: Gait belt Activity Tolerance: Patient tolerated treatment well Patient left: with call  bell/phone within reach;in bed;with family/visitor present     Time: [id number] PT Time Calculation (min) (ACUTE ONLY): 23 min  Charges:  $Gait Training: 8-22 mins $Therapeutic Exercise: 8-22 mins                    G Codes:      Cristela Blue 03/07/2016, 3:49 PM  Governor Rooks, PTA pager [phone]

## 2016-02-23 NOTE — Clinical Social Work Note (Signed)
Clinical Social Worker facilitated patient discharge including contacting patient family and facility to confirm patient discharge plans.  Clinical information faxed to facility and family agreeable with plan.  CSW arranged ambulance transport via [facility] to [facility] .  RN to call  [phone] for report prior to discharge.  Clinical Social Worker will sign off for now as social work intervention is no longer needed. Please consult [place] again if new need arises.  [address], [place]

## 2016-02-23 NOTE — Progress Notes (Signed)
Physical Therapy Treatment Patient Details Name: [name] MRN: [id number] DOB: [birth year] Today's Date: 02/23/2016    History of Present Illness 81 yo admitted for Lt TKA with post op Afib. PMHx: Mitral valve repair, CAD, HTN, Left RCR    PT Comments    Pt performed increased mobility and increased gait distance.  Pt required cues for safety.  Pt to d/c to SNF to address strength and functional mobility deficits before returning home.    Follow Up Recommendations  SNF     Equipment Recommendations  Rolling walker with 5" wheels;3in1 (PT)    Recommendations for Other Services       Precautions / Restrictions Precautions Precautions: Knee;Fall Required Braces or Orthoses: Knee Immobilizer - Left Restrictions Weight Bearing Restrictions: Yes LLE Weight Bearing: Weight bearing as tolerated    Mobility  Bed Mobility Overal bed mobility: Needs Assistance Bed Mobility: Sit to Supine     Supine to sit: HOB elevated;Supervision Sit to supine: Min assist   General bed mobility comments: assist to lift B LEs into bed.    Transfers Overall transfer level: Needs assistance Equipment used: Rolling walker (2 wheeled) Transfers: Sit to/from Stand Sit to Stand: Supervision Stand pivot transfers: Min guard       General transfer comment: Cues for hand placement to and from seated surface.    Ambulation/Gait Ambulation/Gait assistance: Min guard Ambulation Distance (Feet): 85 Feet Assistive device: Rolling walker (2 wheeled) Gait Pattern/deviations: Step-through pattern;Trunk flexed;Decreased stride length;Narrow base of support Gait velocity: decr Gait velocity interpretation: Below normal speed for age/gender General Gait Details: Cues for sequencing.  Cues for UE use during L stance phase to ease pain.     Stairs            Wheelchair Mobility    Modified Rankin (Stroke Patients Only)       Balance Overall balance assessment: Needs  assistance Sitting-balance support: No upper extremity supported Sitting balance-Leahy Scale: Good       Standing balance-Leahy Scale: Fair                      Cognition Arousal/Alertness: Awake/alert Behavior During Therapy: WFL for tasks assessed/performed Overall Cognitive Status: Within Functional Limits for tasks assessed                      Exercises Total Joint Exercises Ankle Circles/Pumps: AROM;Both;10 reps;Supine Quad Sets: AROM;Both;10 reps;Supine Heel Slides: AROM;Left;10 reps;Supine Hip ABduction/ADduction: AROM;Left;10 reps;Supine Straight Leg Raises: AROM;Left;10 reps;Supine Long Arc Quad: AAROM;Left;Seated;10 reps Goniometric ROM: 4-75 degrees.      General Comments        Pertinent Vitals/Pain Pain Assessment: 0-10 Pain Score: 6  Pain Location: L knee Pain Descriptors / Indicators: Aching;Sore Pain Intervention(s): Monitored during session;Repositioned;Patient requesting pain meds-RN notified    Home Living                      Prior Function            PT Goals (current goals can now be found in the care plan section) Acute Rehab PT Goals Patient Stated Goal: return home Potential to Achieve Goals: Good Progress towards PT goals: Progressing toward goals    Frequency    7X/week      PT Plan Current plan remains appropriate    Co-evaluation             End of Session Equipment Utilized During Treatment: Gait belt Activity  Tolerance: Patient limited by fatigue Patient left: with call bell/phone within reach;in chair     Time: 1117-1140 PT Time Calculation (min) (ACUTE ONLY): 23 min  Charges:  $Gait Training: 8-22 mins $Therapeutic Exercise: 8-22 mins                    G Codes:      Cristela Blue 2016/03/02, 2:56 PM  Governor Rooks, PTA pager [phone]

## 2016-02-23 NOTE — Progress Notes (Signed)
   Subjective: 3 Days Post-Op Procedure(s) (LRB): LEFT TOTAL KNEE ARTHROPLASTY WITH RIGHT KNEE INJECTION (Left) KNEE INJECTION (Right) Patient reports pain as mild.    Objective: Vital signs in last 24 hours: Temp:  [98.8 F (37.1 C)-100 F (37.8 C)] 99.4 F (37.4 C) (01/07 2049) Pulse Rate:  [82-91] 82 (01/07 2049) Resp:  [18] 18 (01/07 2049) BP: (112-120)/(61-80) 120/73 (01/07 2049) SpO2:  [91 %-94 %] 94 % (01/07 2049)  Intake/Output from previous day: 01/07 0701 - 01/08 0700 In: 480 [P.O.:480] Out: 700 [Urine:700] Intake/Output this shift: Total I/O In: -  Out: 700 [Urine:700]   Recent Labs  02/21/16 0747 02/22/16 0419  HGB 14.3 12.4*    Recent Labs  02/21/16 0747 02/22/16 0419  WBC 13.5* 13.4*  RBC 4.44 3.84*  HCT 41.5 36.1*  PLT 193 167    Recent Labs  02/21/16 0747 02/22/16 0419  NA 135 134*  K 4.9 3.8  CL 98* 96*  CO2 29 32  BUN 13 22*  CREATININE 0.83 0.88  GLUCOSE 168* 145*  CALCIUM 8.5* 8.4*   No results for input(s): LABPT, INR in the last 72 hours.  Neurologically intact No results found.  Assessment/Plan: 3 Days Post-Op Procedure(s) (LRB): LEFT TOTAL KNEE ARTHROPLASTY WITH RIGHT KNEE INJECTION (Left) KNEE INJECTION (Right) Discharge to SNF when bed available.   [name] 02/23/2016, 6:59 AM

## 2016-02-23 NOTE — Discharge Summary (Signed)
Patient ID: [name] MRN: [id number] DOB/AGE: 1935/11/20 81 y.o.  Admit date: 02/20/2016 Discharge date: 02/23/2016  Admission Diagnoses:  Active Problems:   Unilateral primary osteoarthritis, right knee   Arthritis of left knee   Discharge Diagnoses:  Active Problems:   Unilateral primary osteoarthritis, right knee   Arthritis of left knee  status post Procedure(s): LEFT TOTAL KNEE ARTHROPLASTY WITH RIGHT KNEE INJECTION KNEE INJECTION  Past Medical History:  Diagnosis Date  . Allergy   . Arthritis    knees & hips  . BPH (benign prostatic hyperplasia)   . Coronary artery disease excluded   . Diverticulosis   . Endocarditis, valve unspecified, unspecified cause   . GERD (gastroesophageal reflux disease)   . H/O mitral valve repair    Postoperative ring with postoperative atrial fibrillation  . Hiatal hernia 06/13/2002  . History of kidney stones    passed spontaneously x2   . Unspecified essential hypertension   . Weakness of both arms 07/19/2012    Surgeries: Procedure(s): LEFT TOTAL KNEE ARTHROPLASTY WITH RIGHT KNEE INJECTION KNEE INJECTION on 02/20/2016   Consultants: Treatment Team:  Rounding Lbcardiology, MD  Discharged Condition: Improved  Hospital Course: [name] is an 81 y.o. male who was admitted 02/20/2016 for operative treatment of knee DJD. Patient failed conservative treatments (please see the history and physical for the specifics) and had severe unremitting pain that affects sleep, daily activities and work/hobbies. After pre-op clearance, the patient was taken to the operating room on 02/20/2016 and underwent  Procedure(s): LEFT TOTAL KNEE ARTHROPLASTY WITH RIGHT KNEE INJECTION KNEE INJECTION.    Patient was given perioperative antibiotics: Anti-infectives    Start     Dose/Rate Route Frequency Ordered Stop   02/20/16 1400  ceFAZolin (ANCEF) IVPB 1 g/50 mL premix     1 g 100 mL/hr over 30 Minutes Intravenous Every 8 hours 02/20/16 1132  02/20/16 2138   02/20/16 0623  ceFAZolin (ANCEF) 2-4 GM/100ML-% IVPB    Comments:  Scronce, Trina   : cabinet override      02/20/16 0623 02/20/16 0742   02/20/16 0618  ceFAZolin (ANCEF) IVPB 2g/100 mL premix     2 g 200 mL/hr over 30 Minutes Intravenous On call to O.R. 02/20/16 [id number] 02/20/16 [id number]       Patient was given sequential compression devices and early ambulation to prevent DVT.   Patient benefited maximally from hospital stay and there were no complications. At the time of discharge, the patient was urinating/moving their bowels without difficulty, tolerating a regular diet, pain is controlled with oral pain medications and they have been cleared by PT/OT.   Recent vital signs: Patient Vitals for the past 24 hrs:  BP Temp Temp src Pulse Resp SpO2  02/23/16 1141 120/73 - - 82 - -  02/22/16 2049 120/73 99.4 F (37.4 C) Oral 82 18 94 %  02/22/16 1650 - 100 F (37.8 C) Oral - - -  02/22/16 1343 112/61 98.8 F (37.1 C) Oral 91 18 91 %     Recent laboratory studies:  Recent Labs  02/22/16 0419 02/23/16 0905  WBC 13.4* 10.5  HGB 12.4* 12.5*  HCT 36.1* 36.0*  PLT 167 196  NA 134* 135  K 3.8 3.6  CL 96* 98*  CO2 32 29  BUN 22* 17  CREATININE 0.88 0.80  GLUCOSE 145* 207*  CALCIUM 8.4* 8.4*     Discharge Medications:   Allergies as of 02/23/2016  Reactions   Flomax [tamsulosin Hcl] Other (See Comments)   Makes him "swimmy headed"      Medication List    STOP taking these medications   acetaminophen 500 MG tablet Commonly known as:  TYLENOL   ALEVE 220 MG tablet Generic drug:  naproxen sodium   meloxicam 7.5 MG tablet Commonly known as:  MOBIC   multivitamin tablet     TAKE these medications   amLODipine 2.5 MG tablet Commonly known as:  NORVASC Take 2.5 mg by mouth daily before breakfast.   aspirin 325 MG EC tablet Take 1 tablet (325 mg total) by mouth once.   clopidogrel 75 MG tablet Commonly known as:  PLAVIX Take 75 mg by mouth daily  after supper.   lisinopril 10 MG tablet Commonly known as:  PRINIVIL,ZESTRIL Take 10 mg by mouth daily before breakfast.   methocarbamol 500 MG tablet Commonly known as:  ROBAXIN Take 1 tablet (500 mg total) by mouth every 8 (eight) hours as needed for muscle spasms.   omeprazole 20 MG capsule Commonly known as:  PRILOSEC Take 20 mg by mouth daily before breakfast.   oxyCODONE-acetaminophen 5-325 MG tablet Commonly known as:  ROXICET Take 1 tablet by mouth every 6 (six) hours as needed for severe pain.   pravastatin 20 MG tablet Commonly known as:  PRAVACHOL TAKE 1 TABLET (20 MG TOTAL) BY MOUTH EVERY EVENING.       Diagnostic Studies: Dg Chest 2 View  Result Date: 02/11/2016 CLINICAL DATA:  Total knee replacement. EXAM: CHEST  2 VIEW COMPARISON:  10/28/2015 . FINDINGS: Mediastinum and hilar structures are normal. Low lung volumes with mild basilar atelectasis. Mild bibasilar pneumonia cannot be excluded. No pleural effusion or pneumothorax. IMPRESSION: Low lung volumes with mild basilar atelectasis. Mild bibasilar pneumonia cannot be excluded . Electronically Signed   By: Marcello Moores  Register   On: 02/11/2016 12:23   Dg Knee Left Port  Result Date: 02/20/2016 CLINICAL DATA:  Status post left knee replacement EXAM: PORTABLE LEFT KNEE - 1-2 VIEW COMPARISON:  None. FINDINGS: Left knee prosthesis is seen. No acute bony abnormality is noted. Air is noted in the soft tissues related to the recent surgery. No acute abnormality is noted. IMPRESSION: Status post left knee replacement.  No acute abnormality noted. Electronically Signed   By: Inez Catalina M.D.   On: 02/20/2016 13:36       Contact information for follow-up providers    [facility]. Call.   Why:  Someone from [facility] will contact you to arrange start date and time for therapy. Contact information: [address] [place] [address] [phone]        [name], MD. Schedule an  appointment as soon as possible for a visit today.   Specialty:  Orthopedic Surgery Why:  need return office visit 2 weeks postop.  can be seen in [facility].  Contact information: [place] [address] [phone]            Contact information for after-discharge care    [facility] Follow up.   Specialty:  [place] information: [address] [place] [phone]                  Discharge Plan:  discharge to snf  Disposition:     Signed: Benjiman Core for Rodell Perna MD [facility] orthopedics.  02/23/2016, 12:45 PM

## 2016-02-23 NOTE — Progress Notes (Signed)
Reviewed discharge orders and medications with family, called [facility] and gave report to [employer].  Pt waiting on Ptar to rehab center

## 2016-02-23 NOTE — Discharge Instructions (Signed)

## 2016-02-23 NOTE — Progress Notes (Signed)
Patient Name: [name] Date of Encounter: 02/23/2016  Primary Cardiologist:   Dr. [facility] Problem List     Active Problems:   Unilateral primary osteoarthritis, right knee   Arthritis of left knee     Subjective   No palpitations.  No chest pain or SOB.   Inpatient Medications    Scheduled Meds: . amLODipine  2.5 mg Oral QAC breakfast  . aspirin EC  325 mg Oral Q breakfast  . clopidogrel  75 mg Oral QPC supper  . docusate sodium  100 mg Oral BID  . lisinopril  10 mg Oral QAC breakfast  . metoprolol tartrate  25 mg Oral BID  . pravastatin  20 mg Oral q1800   Continuous Infusions: . sodium chloride 90 mL/hr at 02/20/16 1159   PRN Meds: acetaminophen **OR** acetaminophen, HYDROmorphone (DILAUDID) injection, menthol-cetylpyridinium **OR** phenol, methocarbamol **OR** methocarbamol (ROBAXIN)  IV, metoCLOPramide **OR** metoCLOPramide (REGLAN) injection, ondansetron **OR** ondansetron (ZOFRAN) IV, oxyCODONE, polyethylene glycol, sodium phosphate   Vital Signs    Vitals:   02/22/16 1343 02/22/16 1650 02/22/16 2049 02/23/16 1141  BP: 112/61  120/73 120/73  Pulse: 91  82 82  Resp: 18  18   Temp: 98.8 F (37.1 C) 100 F (37.8 C) 99.4 F (37.4 C)   TempSrc: Oral Oral Oral   SpO2: 91%  94%   Weight:      Height:        Intake/Output Summary (Last 24 hours) at 02/23/16 1225 Last data filed at 02/23/16 0027  Gross per 24 hour  Intake              240 ml  Output              700 ml  Net             -460 ml   Filed Weights   02/20/16 0627  Weight: 240 lb (108.9 kg)    Physical Exam    GEN: NAD.  Neck:  No JVD Cardiac: Irregular Rate and Rhythm, no murmurs, rubs, or gallops.  Mild left leg edema.  Radials/DP/PT 2+  and equal bilaterally.  Respiratory:  Respirations  regular and unlabored, clear to auscultation bilaterally. GI: Soft, nontender, nondistended, BS + x 4. Skin: warm and dry, no rash. Neuro:   Strength and sensation are  intact. Psych:  AAOx3.  Normal affect.  Labs    CBC  Recent Labs  02/22/16 0419 02/23/16 0905  WBC 13.4* 10.5  HGB 12.4* 12.5*  HCT 36.1* 36.0*  MCV 94.0 92.8  PLT 167 [id number]   Basic Metabolic Panel  Recent Labs  02/22/16 0419 02/23/16 0905  NA 134* 135  K 3.8 3.6  CL 96* 98*  CO2 32 29  GLUCOSE 145* 207*  BUN 22* 17  CREATININE 0.88 0.80  CALCIUM 8.4* 8.4*   Liver Function Tests No results for input(s): AST, ALT, ALKPHOS, BILITOT, PROT, ALBUMIN in the last 72 hours. No results for input(s): LIPASE, AMYLASE in the last 72 hours. Cardiac Enzymes No results for input(s): CKTOTAL, CKMB, CKMBINDEX, TROPONINI in the last 72 hours. BNP Invalid input(s): POCBNP D-Dimer No results for input(s): DDIMER in the last 72 hours. Hemoglobin A1C No results for input(s): HGBA1C in the last 72 hours. Fasting Lipid Panel No results for input(s): CHOL, HDL, LDLCALC, TRIG, CHOLHDL, LDLDIRECT in the last 72 hours. Thyroid Function Tests No results for input(s): TSH, T4TOTAL, T3FREE, THYROIDAB in the last 72 hours.  Invalid input(s):  FREET3  Telemetry    Atrial fib with controlled rate.   - Personally Reviewed  ECG    NA  - Personally Reviewed  Radiology    No results found.  Cardiac Studies   None  Patient Profile     81 year old male with past medical history of mitral valve repair and now status post knee replacement with postoperative atrial fibrillation. Echocardiogram September 2017 showed normal LV function, prior mitral valve repair with no mitral regurgitation, mild to moderate left atrial enlargement and mild right atrial enlargement.  Assessment & Plan    ATRIAL FIB:    Mr. RAYEN SIRI has a CHA2DS2 - VASc score of 3.  New onset.  Rate is OK.  I will arrange follow up.    MV REPAIR:    Stable repair earlier this year.  No further imaging.    WIDE COMPLEX ARRHYTHMIA:  Noted earlier this admit.  No further runs.  Few ectopic beats and pairs.    Signed, Minus Breeding, MD  02/23/2016, 12:25 PM

## 2016-02-24 ENCOUNTER — Encounter (HOSPITAL_COMMUNITY): Payer: Self-pay | Admitting: Orthopaedic Surgery

## 2016-02-26 ENCOUNTER — Ambulatory Visit (INDEPENDENT_AMBULATORY_CARE_PROVIDER_SITE_OTHER): Payer: Medicare Other | Admitting: Orthopaedic Surgery

## 2016-02-26 ENCOUNTER — Encounter (INDEPENDENT_AMBULATORY_CARE_PROVIDER_SITE_OTHER): Payer: Self-pay | Admitting: Orthopaedic Surgery

## 2016-02-26 VITALS — BP 116/70 | HR 99 | Temp 96.6°F | Resp 24 | Ht 71.0 in | Wt 230.0 lb

## 2016-02-26 DIAGNOSIS — Z96652 Presence of left artificial knee joint: Secondary | ICD-10-CM

## 2016-02-26 DIAGNOSIS — I4891 Unspecified atrial fibrillation: Secondary | ICD-10-CM

## 2016-02-26 NOTE — Progress Notes (Signed)
Post-Op Visit Note   Patient: [name]           Date of Birth: 23-Jan-1936           MRN: [id number] Visit Date: 02/26/2016 PCP: Sherrie Mustache, MD   Assessment & Plan:  Chief Complaint:  Chief Complaint  Patient presents with  . Left Knee - Follow-up, Wound Check   Visit Diagnoses:  1. Status post total left knee replacement   2. Atrial fibrillation, unspecified type ([place])    Knee exam shows some erythema particularly distally. No drainage from the incision. No cellulitis. He is on Lovenox since he has A. fib this may cause a little bit of excess bleeding. Does not appear to have significant hemarthrosis. Some this may be related to some ice burn at the distal aspect of the incision. Will limit the ice application only [id number] minutes at a time. Temperature is 96.6. He remains in atrial fib Plan: Return office visit 1 week. Will obtain standing x-rays at that time.  Follow-Up Instructions: Return in about 1 week (around 03/04/2016).   Orders:  No orders of the defined types were placed in this encounter.  No orders of the defined types were placed in this encounter.  HPI Patient comes in as work in appointment today for incision check. The nurse at [facility] was concerned about redness around his incision. He is6 days post op, left total knee arthroplasty (02/20/2016).   PMFS History: Patient Active Problem List   Diagnosis Date Noted  . Arthritis of left knee 02/20/2016    Priority: High  . Unilateral primary osteoarthritis, right knee 01/15/2016    Priority: Medium  . Weakness of both arms 07/19/2012  . Colon cancer screening 06/22/2012  . Unspecified venous (peripheral) insufficiency 05/05/2012  . Chronic venous insufficiency 04/07/2012  . Other malaise and fatigue 03/16/2012  . Disturbance of skin sensation 03/16/2012  . Carotid stenosis 08/16/2011  . Obesity 08/16/2011  . Coronary artery disease excluded   . H/O mitral valve repair   .  Endocarditis, valve unspecified, unspecified cause   . Dizziness 12/16/2010  . Dyslipidemia 09/23/2010  . Essential hypertension 04/06/2007  . ATRIAL FIBRILLATION 04/06/2007  . OBSTRUCTIVE SLEEP APNEA 04/06/2007  . DYSPNEA 04/06/2007   Past Medical History:  Diagnosis Date  . Allergy   . Arthritis    knees & hips  . BPH (benign prostatic hyperplasia)   . Coronary artery disease excluded   . Diverticulosis   . Endocarditis, valve unspecified, unspecified cause   . GERD (gastroesophageal reflux disease)   . H/O mitral valve repair    Postoperative ring with postoperative atrial fibrillation  . Hiatal hernia 06/13/2002  . History of kidney stones    passed spontaneously x2   . Unspecified essential hypertension   . Weakness of both arms 07/19/2012    Family History  Problem Relation Age of Onset  . Congestive Heart Failure Father   . Heart disease Father   . Stroke Brother   . Stroke Sister   . Diabetes Mellitus II Brother   . Diabetes Mellitus II Sister   . Lung cancer Brother   . Colon cancer Neg Hx   . Esophageal cancer Neg Hx   . Rectal cancer Neg Hx   . Stomach cancer Neg Hx     Past Surgical History:  Procedure Laterality Date  . CATARACT EXTRACTION W/PHACO Right 05/12/2015   Procedure: CATARACT EXTRACTION PHACO AND INTRAOCULAR LENS PLACEMENT RIGHT EYE CDE=7.75;  Surgeon: Tonny Branch, MD;  Location: [facility];  Service: Ophthalmology;  Laterality: Right;  . CATARACT EXTRACTION W/PHACO Left 06/12/2015   Procedure: CATARACT EXTRACTION PHACO AND INTRAOCULAR LENS PLACEMENT (IOC);  Surgeon: Tonny Branch, MD;  Location: [facility];  Service: Ophthalmology;  Laterality: Left;  CDE:  13.17  . CHOLECYSTECTOMY    . INJECTION KNEE Right 02/20/2016   Procedure: KNEE INJECTION;  Surgeon: [name], MD;  Location: [facility];  Service: Orthopedics;  Laterality: Right;  . KNEE SURGERY Left   . MITRAL VALVE REPAIR    . MV repair     2003  . ROTATOR CUFF REPAIR Left   . TOTAL KNEE ARTHROPLASTY  Left 02/20/2016   Procedure: LEFT TOTAL KNEE ARTHROPLASTY WITH RIGHT KNEE INJECTION;  Surgeon: [name], MD;  Location: [facility];  Service: Orthopedics;  Laterality: Left;  Marland Kitchen VASECTOMY     Social History   Occupational History  .  Machine Music therapist     RETIRED   Social History Main Topics  . Smoking status: Former Smoker    Packs/day: 3.00    Years: 35.00    Types: Cigarettes    Quit date: 02/16/1988  . Smokeless tobacco: Never Used     Comment:  Year Quit: 1990   . Alcohol use No  . Drug use: No  . Sexual activity: Not on file

## 2016-03-02 ENCOUNTER — Encounter: Payer: Self-pay | Admitting: Physician Assistant

## 2016-03-02 ENCOUNTER — Ambulatory Visit (INDEPENDENT_AMBULATORY_CARE_PROVIDER_SITE_OTHER): Payer: Medicare Other | Admitting: Physician Assistant

## 2016-03-02 VITALS — BP 147/83 | HR 100 | Ht 71.0 in | Wt 233.2 lb

## 2016-03-02 DIAGNOSIS — I4819 Other persistent atrial fibrillation: Secondary | ICD-10-CM

## 2016-03-02 DIAGNOSIS — Z9889 Other specified postprocedural states: Secondary | ICD-10-CM

## 2016-03-02 DIAGNOSIS — I481 Persistent atrial fibrillation: Secondary | ICD-10-CM

## 2016-03-02 DIAGNOSIS — Z7901 Long term (current) use of anticoagulants: Secondary | ICD-10-CM | POA: Diagnosis not present

## 2016-03-02 MED ORDER — LISINOPRIL 20 MG PO TABS: 20.0000 mg | ORAL_TABLET | Freq: Every day | ORAL | 5 refills | Status: DC

## 2016-03-02 NOTE — Addendum Note (Signed)
Addended by: Leanord Asal T on: 03/02/2016 04:28 PM   Modules accepted: Orders

## 2016-03-02 NOTE — Progress Notes (Signed)
Cardiology Office Note   Date:  03/02/2016   ID:  [name], DOB 1935-06-03, MRN [id number]  PCP:  Sherrie Mustache, MD  Cardiologist:  Dr Percival Spanish, 10/24/2015  Rosaria Ferries, PA-C   Chief Complaint  Patient presents with  . Hospitalization Follow-up    A-FIB    History of Present Illness: EYOEL DAWS is a 81 y.o. male with a history of MV repair w/ post-op afib, GERD, HTN, OA, BPH. Echo 10/2015 w/ nl LV function, MV repair with no MR, mild-mod LAE, mild RAE  Admit 01/5-0 1/8 left total knee replacement and was seen by cardiology because of postoperative atrial fibrillation. CHA2DS2 - VASc score of 3. On ASA 325 mg and Plavix  [name] presents for post-hospital follow up  He does not feel as good as he did. He has less energy and concentration. He is making progress with rehab. They meet with the staff tomorrow. He is using a walker and can transfer within the house by himself, But cannot get himself in and out of the car.  He never feels the palpitations. Occasionally gets light-headed or dizzy, cannot remember specific events. He has not had chest pain. He is having more pain from his knee right now. He is not aware of any significant lower extremity edema although his wife states he has a small amount during the day. He denies orthopnea or PND.  His wife is worried because his heart rate was 116 one time when it was checked. This was in the setting of him exercising with the rehabilitation staff. However, she also remembers that when he was in sinus rhythm, he was noted to have a low heart rate and his heart rate is frequently in the high 40s and low 50s at rest. Because of this, he has never been on a beta blocker or calcium channel blocker.   Past Medical History:  Diagnosis Date  . Allergy   . Arthritis    knees & hips  . BPH (benign prostatic hyperplasia)   . Coronary artery disease excluded   . Diverticulosis   . Endocarditis, valve  unspecified, unspecified cause   . GERD (gastroesophageal reflux disease)   . H/O mitral valve repair    Postoperative ring with postoperative atrial fibrillation  . Hiatal hernia 06/13/2002  . History of kidney stones    passed spontaneously x2   . Unspecified essential hypertension   . Weakness of both arms 07/19/2012    Past Surgical History:  Procedure Laterality Date  . CATARACT EXTRACTION W/PHACO Right 05/12/2015   Procedure: CATARACT EXTRACTION PHACO AND INTRAOCULAR LENS PLACEMENT RIGHT EYE CDE=7.75;  Surgeon: Tonny Branch, MD;  Location: [facility];  Service: Ophthalmology;  Laterality: Right;  . CATARACT EXTRACTION W/PHACO Left 06/12/2015   Procedure: CATARACT EXTRACTION PHACO AND INTRAOCULAR LENS PLACEMENT (IOC);  Surgeon: Tonny Branch, MD;  Location: [facility];  Service: Ophthalmology;  Laterality: Left;  CDE:  13.17  . CHOLECYSTECTOMY    . INJECTION KNEE Right 02/20/2016   Procedure: KNEE INJECTION;  Surgeon: [name], MD;  Location: [place];  Service: Orthopedics;  Laterality: Right;  . KNEE SURGERY Left   . MITRAL VALVE REPAIR    . MV repair     2003  . ROTATOR CUFF REPAIR Left   . TOTAL KNEE ARTHROPLASTY Left 02/20/2016   Procedure: LEFT TOTAL KNEE ARTHROPLASTY WITH RIGHT KNEE INJECTION;  Surgeon: [name], MD;  Location: [place];  Service: Orthopedics;  Laterality:  Left;  . VASECTOMY      Current Outpatient Prescriptions  Medication Sig Dispense Refill  . amLODipine (NORVASC) 2.5 MG tablet Take 2.5 mg by mouth daily before breakfast.     . aspirin 325 MG tablet Take 325 mg by mouth daily.    . clopidogrel (PLAVIX) 75 MG tablet Take 75 mg by mouth daily after supper.     Marland Kitchen lisinopril (PRINIVIL,ZESTRIL) 20 MG tablet Take 1 tablet (20 mg total) by mouth daily before breakfast. 30 tablet 5  . meloxicam (MOBIC) 7.5 MG tablet Take by mouth.    . methocarbamol (ROBAXIN) 500 MG tablet Take 1 tablet (500 mg total) by mouth every 8 (eight) hours as needed for muscle spasms. 60 tablet 0    . omeprazole (PRILOSEC) 20 MG capsule Take 20 mg by mouth daily before breakfast.     . oxyCODONE-acetaminophen (ROXICET) 5-325 MG tablet Take 1 tablet by mouth every 6 (six) hours as needed for severe pain. 60 tablet 0  . pravastatin (PRAVACHOL) 20 MG tablet TAKE 1 TABLET (20 MG TOTAL) BY MOUTH EVERY EVENING. 30 tablet 11  . vitamin C (ASCORBIC ACID) 500 MG tablet Take 500 mg by mouth daily.     No current facility-administered medications for this visit.     Allergies:   Flomax [tamsulosin hcl]    Social History:  The patient  reports that he quit smoking about 28 years ago. His smoking use included Cigarettes. He has a 105.00 pack-year smoking history. He has never used smokeless tobacco. He reports that he does not drink alcohol or use drugs.   Family History:  The patient's family history includes Congestive Heart Failure in his father; Diabetes Mellitus II in his brother and sister; Heart disease in his father; Lung cancer in his brother; Stroke in his brother and sister.    ROS:  Please see the history of present illness. All other systems are reviewed and negative.    PHYSICAL EXAM: VS:  BP (!) 147/83   Pulse 100   Ht 5\' 11"  (1.803 m)   Wt 233 lb 3.2 oz (105.8 kg)   BMI 32.52 kg/m  , BMI Body mass index is 32.52 kg/m. GEN: Well nourished, well developed, male in no acute distress  HEENT: normal for age  Neck: no JVD, no carotid bruit, no masses Cardiac: Irregular R&R; soft murmur, no rubs, or gallops Respiratory:  clear to auscultation bilaterally, normal work of breathing GI: soft, nontender, nondistended, + BS MS: no deformity or atrophy; trace edema; distal pulses are 2+ in all 4 extremities   Skin: warm and dry, no rash Neuro:  Strength and sensation are intact Psych: euthymic mood, full affect   EKG:  EKG is ordered today. The ekg ordered today demonstrates atrial fibrillation, heart rate 100  ECHO: 11/05/2015 - Left ventricle: The cavity size was normal.  Wall thickness was   increased in a pattern of mild LVH. Systolic function was normal.   The estimated ejection fraction was in the range of 55% to 60%.   Indeterminant diastolic function. - Aortic valve: There was no stenosis. - Mitral valve: Status post mitral valve repair. Mean gradient 4   mmHg with pressure half-time not significantly prolonged.   Probably minimal mitral stenosis. There was no significant   regurgitation. Pressure half-time: 128 ms. Mean gradient (D): 4   mm Hg. Valve area by pressure half-time: 1.83 cm^2. - Left atrium: The atrium was mildly to moderately dilated. - Right ventricle: The cavity  size was normal. Systolic function   was normal. - Right atrium: The atrium was mildly dilated. - Tricuspid valve: Peak RV-RA gradient (S): 27 mm Hg. - Pulmonary arteries: PA peak pressure: 30 mm Hg (S). - Inferior vena cava: The vessel was normal in size. The   respirophasic diameter changes were in the normal range (= 50%),   consistent with normal central venous pressure. Impressions: - Normal LV size with mild LV hypertrophy. EF 55-60%. Normal RV   size and systolic function. Biatrial enlargement. Status post   mitral valve repair without significant stenosis or   regurgitation.   Recent Labs: 02/11/2016: ALT 15 02/23/2016: BUN 17; Creatinine, Ser 0.80; Hemoglobin 12.5; Platelets 196; Potassium 3.6; Sodium 135    Lipid Panel No results found for: CHOL, TRIG, HDL, CHOLHDL, VLDL, LDLCALC, LDLDIRECT   Wt Readings from Last 3 Encounters:  03/02/16 233 lb 3.2 oz (105.8 kg)  02/26/16 230 lb (104.3 kg)  02/20/16 240 lb (108.9 kg)     Other studies Reviewed: Additional studies/ records that were reviewed today include: Hospital records, office notes and testing.  ASSESSMENT AND PLAN:  1.  Persistent atrial fibrillation: I believe he would benefit from sinus rhythm. His symptoms are not severe enough to require TEE cardioversion, but he does need to be  anticoagulated. I contacted Dr. Lorin Mercy and he stated it is okay for Mr. Domanico to be anticoagulated.  Once Mr Soldano has been therapeutic on coumadin for 4 weeks, f/u in office and consider DCCV.  2. Anticoagulation: Because of his MV repair, no insurance company will cover NOAC. He will be started on coumadin. It will be followed by the facility, he will need to get an INR shortly after d/c.   Will d/c ASA and discuss Plavix with Dr Percival Spanish.   3. MV repair: recent echo showed no sig MR, good function  Current medicines are reviewed at length with the patient today.  The patient does not have concerns regarding medicines.  The following changes have been made:  no change  Labs/ tests ordered today include:   Orders Placed This Encounter  Procedures  . EKG 12-Lead     Disposition:   FU with Dr Percival Spanish  Signed, Rosaria Ferries, PA-C  03/02/2016 1:24 PM    [facility] Phone: [phone]; Fax: [phone]  This note was written with the assistance of speech recognition software. Please excuse any transcriptional errors.

## 2016-03-02 NOTE — Patient Instructions (Addendum)
Your physician recommends that you schedule a follow-up appointment in: [place] with Dr. Percival Spanish or Suanne Marker, [place]  Your physician has recommended you make the following change in your medication: INCREASE lisinopril to 20mg  daily  Dr. Lorin Mercy will need to grant permission for you to go on a blood thinner   Faxed notification to [facility] @ [phone] that patient should do the following: 1. STOP aspirin 2. START warfarin 5mg  daily 3. Have weekly INR checks with facility to manage w/goal 2-3 4. Have INR management arranged outpatient at PCP or cardiology

## 2016-03-02 NOTE — Addendum Note (Signed)
Addended by: Fidel Levy on: 03/02/2016 03:25 PM   Modules accepted: Orders

## 2016-03-04 ENCOUNTER — Inpatient Hospital Stay (INDEPENDENT_AMBULATORY_CARE_PROVIDER_SITE_OTHER): Payer: Medicare Other | Admitting: Orthopaedic Surgery

## 2016-03-08 ENCOUNTER — Telehealth (INDEPENDENT_AMBULATORY_CARE_PROVIDER_SITE_OTHER): Payer: Self-pay | Admitting: Orthopaedic Surgery

## 2016-03-08 NOTE — Telephone Encounter (Signed)
Knee looks good per nurse. Using IS, did UA temp 99.8 max.  WBC 15 K. Has appt Thursday , if temp again needs to be seen in office ASAP I told nurse for knee aspiration. He is doing well in therapy. They did UA and blood culture, nothing so far. He has appt Thursday

## 2016-03-08 NOTE — Telephone Encounter (Signed)
Please advise 

## 2016-03-08 NOTE — Telephone Encounter (Signed)
Nursing home in [place] wanted to let you know pt  wbc count is low, administering saline, wanted to let you know pt has infection.  [phone] any nurse

## 2016-03-10 ENCOUNTER — Telehealth: Payer: Self-pay | Admitting: *Deleted

## 2016-03-10 NOTE — Telephone Encounter (Signed)
Talked to patients' wife. Noted recommendation from cardiology to start coumadin on 03/02/16. Recommendations also discussed with Dr Lorin Mercy per notes review   Will call the nursing home and verify if patient of any anticoagulation at this time.  Will clarify need for orders and give verbal order if possible.  Otherwise will discuss with MD/NP 1st thing in the morning.

## 2016-03-10 NOTE — Telephone Encounter (Signed)
Per Nevin Bloodgood RN  Last dose of lovenox 40mg  given 03/09/16. Patient on ASA 325mg  and Plavix 75mg  daily.  ====================================== [facility] will need orders for coumadin fax to  Attn: [place] @ [phone] ====================================== Also need order to stop ASA  And/or plavix

## 2016-03-10 NOTE — Telephone Encounter (Signed)
Follow up      Needs to speak to you again about the blood thinner, the rehab center said they can not give him a blood thinner without a order

## 2016-03-10 NOTE — Telephone Encounter (Signed)
I spoke to Peter Congo (wife). She informed me patient was still in nursing home [facility]) and should be discharged this weekend. States that the facility is giving lovenox, she'd discussed meds w nurse at facility who suggested he be on something different than coumadin. States "I'd be willing to pay whatever, regardless of insurance" I explained that w the valve repair, NOACs are contraindicated, and he would need to be on coumadin as most appropriate therapy.  She asked that we get in touch w his nursing facility to coordinate his care prior to discharge. [facility], but cannot take it to him for the facility to administer per their policy. She's nervous about risk of stroke - notes the patient's sister passed away yesterday d/t same. Unsure how to proceed further on this. Routing to coumadin clinic to advise.

## 2016-03-10 NOTE — Telephone Encounter (Signed)
Acknowledged, will need to have coumadin clinic review.

## 2016-03-10 NOTE — Telephone Encounter (Signed)
-----   Message from Lonn Georgia, PA-C sent at 03/08/2016  3:52 PM EST ----- See below. Please contact pt's wife and see if he is home or still at [facility]. He needs to stop the Plavix. Make sure he has an INR scheduled with [place] (maybe in [place]), or at PCP office after d/c from the [place]. Any questions, ask me. Thanks Suanne Marker  ----- Message ----- From: Minus Breeding, MD Sent: 03/07/2016   6:48 PM To: Lonn Georgia, PA-C  No ----- Message ----- From: Lonn Georgia, PA-C Sent: 03/02/2016   2:30 PM To: Minus Breeding, MD, Lonn Georgia, PA-C  Pt has been on Plavix since 2014, I think for ?PAD, but no intervention.   Because of his afib, he needs to be on coumadin, cannot get NOAC covered due to MV repair.  Should he stay on the Plavix?  Thanks

## 2016-03-11 ENCOUNTER — Ambulatory Visit (INDEPENDENT_AMBULATORY_CARE_PROVIDER_SITE_OTHER): Payer: Medicare Other | Admitting: Orthopaedic Surgery

## 2016-03-11 ENCOUNTER — Encounter (INDEPENDENT_AMBULATORY_CARE_PROVIDER_SITE_OTHER): Payer: Self-pay | Admitting: Orthopaedic Surgery

## 2016-03-11 ENCOUNTER — Ambulatory Visit (INDEPENDENT_AMBULATORY_CARE_PROVIDER_SITE_OTHER): Payer: Medicare Other

## 2016-03-11 ENCOUNTER — Telehealth (INDEPENDENT_AMBULATORY_CARE_PROVIDER_SITE_OTHER): Payer: Self-pay | Admitting: *Deleted

## 2016-03-11 VITALS — BP 123/81 | HR 91 | Ht 71.0 in | Wt 230.0 lb

## 2016-03-11 DIAGNOSIS — M1712 Unilateral primary osteoarthritis, left knee: Secondary | ICD-10-CM

## 2016-03-11 NOTE — Telephone Encounter (Signed)
I called and spoke with Deneise Lever at [place] at [facility]). [phone] She had me fax all info to intake person at [phone]. Info faxed.

## 2016-03-11 NOTE — Telephone Encounter (Signed)
Spoke with Armanda Heritage as well as pharmacist Raquel.  Per PA- STOP Plavix.     Orders regarding ASA and Coumadin dosing/INR checks were originally faxed on 1/16.    Orders re-faxed with additional signed order to stop Plavix to [facility] [phone] Attention: [place].

## 2016-03-11 NOTE — Progress Notes (Signed)
Office Visit Note   Patient: [name]           Date of Birth: [birth year]           MRN: [id number] Visit Date: 03/11/2016              Requested by: Dione Housekeeper, MD [address] [place], [place] [address] PCP: Sherrie Mustache, MD   Assessment & Plan: Visit Diagnoses:  1. Unilateral primary osteoarthritis, left knee   Post left total knee arthroplasty on 02/20/2016.  Plan: Start on some IV antibiotics when he ran a temperature of 99.8. He is using incentive spirometry and is been afebrile. Knee looks good no evidence of infection incisions well-healed he is afebrile. He has the atrial fib and has had mitral valve repair. He went into atrial fib after the surgery and per his cardiologist Dr. Dannielle Burn he will later be scheduled in a few weeks for cardioversion. [facility]. I will recheck him in 2 weeks and we can set him up for outpatient therapy at that point. His pro times will be drawn at [facility] per cardiology. Follow-Up Instructions: Return in about 2 weeks (around 03/25/2016).   Orders:  Orders Placed This Encounter  Procedures  . XR Knee 1-2 Views Left   No orders of the defined types were placed in this encounter.     Procedures: No procedures performed   Clinical Data: No additional findings.   Subjective: Chief Complaint  Patient presents with  . Left Knee - Routine Post Op    Patient returns status post left total knee arthroplasty 02/20/2016. He states that he is doing well. He is ambulating with walker today. He has been doing therapy which makes him sore, but is going well. He has had an infection per his wife, and has been taking an antibiotic. The patient is still at [facility]. He is ready to go home.    Review of Systems updated unchanged from surgery 02/20/2016   Objective: Vital Signs: BP 123/81   Pulse 91   Ht 5\' 11"  (1.803 m)   Wt 230 lb (104.3 kg)   BMI 32.08  kg/m   Physical Exam mild swelling in the knee incision looks good Steri-Strips removed no cellulitis no increased warmth. Flexion to 90. He's reaching 94 with therapy. A mature with a walker.  Ortho Exam  Specialty Comments:  No specialty comments available.  Imaging: Xr Knee 1-2 Views Left  Result Date: 03/11/2016 [facility] lateral sunrise x-rays left total knee arthroplasty shows good position alignment. Slight anterior notching on the femoral side. No evidence of loosening. Assessment: Satisfactory postop total knee arthroplasty left knee    PMFS History: Patient Active Problem List   Diagnosis Date Noted  . Arthritis of left knee 02/20/2016    Priority: High  . Unilateral primary osteoarthritis, right knee 01/15/2016    Priority: Medium  . Weakness of both arms 07/19/2012  . Colon cancer screening 06/22/2012  . Unspecified venous (peripheral) insufficiency 05/05/2012  . Chronic venous insufficiency 04/07/2012  . Other malaise and fatigue 03/16/2012  . Disturbance of skin sensation 03/16/2012  . Carotid stenosis 08/16/2011  . Obesity 08/16/2011  . Coronary artery disease excluded   . H/O mitral valve repair   . Endocarditis, valve unspecified, unspecified cause   . Dizziness 12/16/2010  . Dyslipidemia 09/23/2010  . Essential hypertension 04/06/2007  . ATRIAL FIBRILLATION 04/06/2007  . OBSTRUCTIVE SLEEP APNEA 04/06/2007  .  DYSPNEA 04/06/2007   Past Medical History:  Diagnosis Date  . Allergy   . Arthritis    knees & hips  . BPH (benign prostatic hyperplasia)   . Coronary artery disease excluded   . Diverticulosis   . Endocarditis, valve unspecified, unspecified cause   . GERD (gastroesophageal reflux disease)   . H/O mitral valve repair    Postoperative ring with postoperative atrial fibrillation  . Hiatal hernia 06/13/2002  . History of kidney stones    passed spontaneously x2   . Unspecified essential hypertension   . Weakness of both arms 07/19/2012      Family History  Problem Relation Age of Onset  . Congestive Heart Failure Father   . Heart disease Father   . Stroke Brother   . Stroke Sister   . Diabetes Mellitus II Brother   . Diabetes Mellitus II Sister   . Lung cancer Brother   . Colon cancer Neg Hx   . Esophageal cancer Neg Hx   . Rectal cancer Neg Hx   . Stomach cancer Neg Hx     Past Surgical History:  Procedure Laterality Date  . CATARACT EXTRACTION W/PHACO Right 05/12/2015   Procedure: CATARACT EXTRACTION PHACO AND INTRAOCULAR LENS PLACEMENT RIGHT EYE CDE=7.75;  Surgeon: Tonny Branch, MD;  Location: [facility];  Service: Ophthalmology;  Laterality: Right;  . CATARACT EXTRACTION W/PHACO Left 06/12/2015   Procedure: CATARACT EXTRACTION PHACO AND INTRAOCULAR LENS PLACEMENT (IOC);  Surgeon: Tonny Branch, MD;  Location: [facility];  Service: Ophthalmology;  Laterality: Left;  CDE:  13.17  . CHOLECYSTECTOMY    . INJECTION KNEE Right 02/20/2016   Procedure: KNEE INJECTION;  Surgeon: [name], MD;  Location: [facility];  Service: Orthopedics;  Laterality: Right;  . KNEE SURGERY Left   . MITRAL VALVE REPAIR    . MV repair     2003  . ROTATOR CUFF REPAIR Left   . TOTAL KNEE ARTHROPLASTY Left 02/20/2016   Procedure: LEFT TOTAL KNEE ARTHROPLASTY WITH RIGHT KNEE INJECTION;  Surgeon: [name], MD;  Location: [place];  Service: Orthopedics;  Laterality: Left;  Marland Kitchen VASECTOMY     Social History   Occupational History  .  Machine Music therapist     RETIRED   Social History Main Topics  . Smoking status: Former Smoker    Packs/day: 3.00    Years: 35.00    Types: Cigarettes    Quit date: 02/16/1988  . Smokeless tobacco: Never Used     Comment:  Year Quit: 1990   . Alcohol use No  . Drug use: No  . Sexual activity: Not on file

## 2016-03-11 NOTE — Telephone Encounter (Signed)
[facility] called stating they will not be able to see him at this time.

## 2016-03-12 NOTE — Telephone Encounter (Signed)
Please confirm for me that this is completed.

## 2016-03-15 ENCOUNTER — Ambulatory Visit (INDEPENDENT_AMBULATORY_CARE_PROVIDER_SITE_OTHER): Payer: Medicare Other | Admitting: Pharmacist Clinician (PhC)/ Clinical Pharmacy Specialist

## 2016-03-15 ENCOUNTER — Telehealth: Payer: Self-pay | Admitting: Cardiology

## 2016-03-15 ENCOUNTER — Other Ambulatory Visit: Payer: Self-pay | Admitting: Pharmacist Clinician (PhC)/ Clinical Pharmacy Specialist

## 2016-03-15 DIAGNOSIS — Z7901 Long term (current) use of anticoagulants: Secondary | ICD-10-CM

## 2016-03-15 DIAGNOSIS — Z5181 Encounter for therapeutic drug level monitoring: Secondary | ICD-10-CM

## 2016-03-15 DIAGNOSIS — I4819 Other persistent atrial fibrillation: Secondary | ICD-10-CM

## 2016-03-15 DIAGNOSIS — I481 Persistent atrial fibrillation: Secondary | ICD-10-CM

## 2016-03-15 LAB — POCT INR: INR: 1.7

## 2016-03-15 MED ORDER — WARFARIN SODIUM 5 MG PO TABS: ORAL_TABLET | ORAL | 1 refills | Status: DC

## 2016-03-15 NOTE — Telephone Encounter (Signed)
[name] [name] INR results .Marland Kitchen The INR was 1.7 and the PT 20.6 Please call if have any instructions or questions . Thanks

## 2016-03-15 NOTE — Telephone Encounter (Signed)
Late entry - I did speak w nurse on Friday who confirms orders were received.

## 2016-03-15 NOTE — Telephone Encounter (Signed)
See anticoag note

## 2016-03-15 NOTE — Telephone Encounter (Signed)
Fwd to coumadin clinic for INR monitoring instruction.

## 2016-03-17 ENCOUNTER — Telehealth (INDEPENDENT_AMBULATORY_CARE_PROVIDER_SITE_OTHER): Payer: Self-pay | Admitting: Orthopaedic Surgery

## 2016-03-17 NOTE — Telephone Encounter (Signed)
[name] from [place] at home called requesting a verbal order for Pt for [employer].  They are going to do PT 2x for two weeks and then continue with written order.  Cb#[phone].  Thank you

## 2016-03-17 NOTE — Telephone Encounter (Signed)
Ok for orders? 

## 2016-03-18 ENCOUNTER — Telehealth (INDEPENDENT_AMBULATORY_CARE_PROVIDER_SITE_OTHER): Payer: Self-pay | Admitting: Radiology

## 2016-03-18 ENCOUNTER — Telehealth (INDEPENDENT_AMBULATORY_CARE_PROVIDER_SITE_OTHER): Payer: Self-pay | Admitting: Orthopaedic Surgery

## 2016-03-18 ENCOUNTER — Ambulatory Visit (INDEPENDENT_AMBULATORY_CARE_PROVIDER_SITE_OTHER): Payer: Medicare Other | Admitting: Pharmacist

## 2016-03-18 DIAGNOSIS — Z7901 Long term (current) use of anticoagulants: Secondary | ICD-10-CM

## 2016-03-18 DIAGNOSIS — I481 Persistent atrial fibrillation: Secondary | ICD-10-CM

## 2016-03-18 DIAGNOSIS — Z5181 Encounter for therapeutic drug level monitoring: Secondary | ICD-10-CM

## 2016-03-18 DIAGNOSIS — I4819 Other persistent atrial fibrillation: Secondary | ICD-10-CM

## 2016-03-18 LAB — PROTIME-INR: INR: 2.8 — AB (ref <=1.1)

## 2016-03-18 NOTE — Telephone Encounter (Signed)
OCCUPATION THERAPY ASSESSMENT COMPLETED W/NO FURTHER NEEDS.

## 2016-03-18 NOTE — Telephone Encounter (Signed)
Use ibuprofen otc 4 po bid with meals, also tylenol 2 po bid. I called discussed.

## 2016-03-18 NOTE — Telephone Encounter (Signed)
fyi

## 2016-03-18 NOTE — Telephone Encounter (Signed)
Patient's wife called stating he was released from rehab yesterday with no pain medication and that the patient needs some for physical therapy. [facility] [place]. Please advise.

## 2016-03-18 NOTE — Telephone Encounter (Signed)
Left [name] a voicemail advising.

## 2016-03-18 NOTE — Telephone Encounter (Signed)
OK - thanks

## 2016-03-24 ENCOUNTER — Ambulatory Visit (INDEPENDENT_AMBULATORY_CARE_PROVIDER_SITE_OTHER): Payer: Medicare Other | Admitting: Pharmacist

## 2016-03-24 DIAGNOSIS — Z5181 Encounter for therapeutic drug level monitoring: Secondary | ICD-10-CM

## 2016-03-24 DIAGNOSIS — I4819 Other persistent atrial fibrillation: Secondary | ICD-10-CM

## 2016-03-24 DIAGNOSIS — I481 Persistent atrial fibrillation: Secondary | ICD-10-CM

## 2016-03-24 DIAGNOSIS — Z7901 Long term (current) use of anticoagulants: Secondary | ICD-10-CM

## 2016-03-24 LAB — PROTIME-INR: INR: 2.9 — AB (ref <=1.1)

## 2016-03-25 ENCOUNTER — Encounter (INDEPENDENT_AMBULATORY_CARE_PROVIDER_SITE_OTHER): Payer: Self-pay | Admitting: Orthopaedic Surgery

## 2016-03-25 ENCOUNTER — Ambulatory Visit (INDEPENDENT_AMBULATORY_CARE_PROVIDER_SITE_OTHER): Payer: Medicare Other | Admitting: Orthopaedic Surgery

## 2016-03-25 ENCOUNTER — Telehealth: Payer: Self-pay | Admitting: Pharmacist

## 2016-03-25 VITALS — BP 129/76 | HR 98 | Ht 71.0 in | Wt 223.0 lb

## 2016-03-25 DIAGNOSIS — M1711 Unilateral primary osteoarthritis, right knee: Secondary | ICD-10-CM

## 2016-03-25 DIAGNOSIS — I481 Persistent atrial fibrillation: Secondary | ICD-10-CM

## 2016-03-25 DIAGNOSIS — I4819 Other persistent atrial fibrillation: Secondary | ICD-10-CM

## 2016-03-25 NOTE — Progress Notes (Signed)
Post-Op Visit Note   Patient: [name]           Date of Birth: [birth year]           MRN: [id number] Visit Date: 03/25/2016 PCP: Sherrie Mustache, MD   Assessment & Plan:  Chief Complaint:  Chief Complaint  Patient presents with  . Left Knee - Routine Post Op   Visit Diagnoses:  1. Unilateral primary osteoarthritis, right knee     Plan: Patient will transfer to outpatient physical therapy at [facility]. His flexion is good quad strength is good he can almost do a straight leg raise. He lacks about 5 reaching full extension on continue work on prone positioning. Return 4 weeks. He is on Coumadin and will be having a cardioversion at some point in the future.  Follow-Up Instructions: Return in about 4 weeks (around 04/22/2016).   Orders:  No orders of the defined types were placed in this encounter.  No orders of the defined types were placed in this encounter.  HPI Patient returns for follow up status post left total knee arthroplasty on 02/20/2016. He is 4 weeks 6 days post op. He has one more week of physical therapy left. The left knee is a little red, swollen, and warm. The incision looks good. He is taking Tylenol Arthritis for pain.  Patient does state that the cardiologist has him on Warfarin for A-fib.   Imaging: No results found.  PMFS History: Patient Active Problem List   Diagnosis Date Noted  . Arthritis of left knee 02/20/2016    Priority: High  . Unilateral primary osteoarthritis, right knee 01/15/2016    Priority: Medium  . Monitoring for long-term anticoagulant use 03/15/2016  . Weakness of both arms 07/19/2012  . Colon cancer screening 06/22/2012  . Unspecified venous (peripheral) insufficiency 05/05/2012  . Chronic venous insufficiency 04/07/2012  . Other malaise and fatigue 03/16/2012  . Disturbance of skin sensation 03/16/2012  . Carotid stenosis 08/16/2011  . Obesity 08/16/2011  . Coronary artery disease excluded   . H/O mitral  valve repair   . Endocarditis, valve unspecified, unspecified cause   . Dizziness 12/16/2010  . Dyslipidemia 09/23/2010  . Essential hypertension 04/06/2007  . ATRIAL FIBRILLATION 04/06/2007  . OBSTRUCTIVE SLEEP APNEA 04/06/2007  . DYSPNEA 04/06/2007   Past Medical History:  Diagnosis Date  . Allergy   . Arthritis    knees & hips  . BPH (benign prostatic hyperplasia)   . Coronary artery disease excluded   . Diverticulosis   . Endocarditis, valve unspecified, unspecified cause   . GERD (gastroesophageal reflux disease)   . H/O mitral valve repair    Postoperative ring with postoperative atrial fibrillation  . Hiatal hernia 06/13/2002  . History of kidney stones    passed spontaneously x2   . Unspecified essential hypertension   . Weakness of both arms 07/19/2012    Family History  Problem Relation Age of Onset  . Congestive Heart Failure Father   . Heart disease Father   . Stroke Brother   . Stroke Sister   . Diabetes Mellitus II Brother   . Diabetes Mellitus II Sister   . Lung cancer Brother   . Colon cancer Neg Hx   . Esophageal cancer Neg Hx   . Rectal cancer Neg Hx   . Stomach cancer Neg Hx     Past Surgical History:  Procedure Laterality Date  . CATARACT EXTRACTION W/PHACO Right 05/12/2015   Procedure: CATARACT EXTRACTION PHACO AND  INTRAOCULAR LENS PLACEMENT RIGHT EYE CDE=7.75;  Surgeon: Tonny Branch, MD;  Location: [facility];  Service: Ophthalmology;  Laterality: Right;  . CATARACT EXTRACTION W/PHACO Left 06/12/2015   Procedure: CATARACT EXTRACTION PHACO AND INTRAOCULAR LENS PLACEMENT (IOC);  Surgeon: Tonny Branch, MD;  Location: [facility];  Service: Ophthalmology;  Laterality: Left;  CDE:  13.17  . CHOLECYSTECTOMY    . INJECTION KNEE Right 02/20/2016   Procedure: KNEE INJECTION;  Surgeon: [name], MD;  Location: [facility];  Service: Orthopedics;  Laterality: Right;  . KNEE SURGERY Left   . MITRAL VALVE REPAIR    . MV repair     2003  . ROTATOR CUFF REPAIR Left   . TOTAL  KNEE ARTHROPLASTY Left 02/20/2016   Procedure: LEFT TOTAL KNEE ARTHROPLASTY WITH RIGHT KNEE INJECTION;  Surgeon: [name], MD;  Location: [facility];  Service: Orthopedics;  Laterality: Left;  Marland Kitchen VASECTOMY     Social History   Occupational History  .  Machine Music therapist     RETIRED   Social History Main Topics  . Smoking status: Former Smoker    Packs/day: 3.00    Years: 35.00    Types: Cigarettes    Quit date: 02/16/1988  . Smokeless tobacco: Never Used     Comment:  Year Quit: 1990   . Alcohol use No  . Drug use: No  . Sexual activity: Not on file

## 2016-03-25 NOTE — Telephone Encounter (Signed)
Spoke to patient's wife who states that patient to be discharged from [facility] soon. She is wanting to set up INR checks through out patient rehab. Advised that usually outpt rehab does not draw INRs for our office, but that we could set him up to be seen in our [place] office since this is closer to his home. She states she will call [facility] to confirm next INR check can be done through them and then set up INR checks in [place] office. Phone number for [place] office provided. She states understanding and appreciation.

## 2016-03-31 ENCOUNTER — Telehealth: Payer: Self-pay | Admitting: *Deleted

## 2016-03-31 ENCOUNTER — Ambulatory Visit (INDEPENDENT_AMBULATORY_CARE_PROVIDER_SITE_OTHER): Payer: Medicare Other | Admitting: Pharmacist

## 2016-03-31 DIAGNOSIS — Z5181 Encounter for therapeutic drug level monitoring: Secondary | ICD-10-CM

## 2016-03-31 DIAGNOSIS — I4819 Other persistent atrial fibrillation: Secondary | ICD-10-CM

## 2016-03-31 DIAGNOSIS — I481 Persistent atrial fibrillation: Secondary | ICD-10-CM | POA: Diagnosis not present

## 2016-03-31 DIAGNOSIS — Z7901 Long term (current) use of anticoagulants: Secondary | ICD-10-CM | POA: Diagnosis not present

## 2016-03-31 LAB — PROTIME-INR: INR: 2.9 — AB (ref <=1.1)

## 2016-03-31 NOTE — Telephone Encounter (Signed)
Mrs. Mcmicken called [facility] office asking for Edrick Oh, RN Wants to discuss patient's  coumdin  Please call [phone]

## 2016-03-31 NOTE — Telephone Encounter (Signed)
No answer at home.  Left message on machine for wife to call me back or I will keep trying to reach her at home.

## 2016-04-01 NOTE — Progress Notes (Addendum)
HPI  The patient presents for evaluation of mitral valve repair.  At the last visit an echo demonstrated stable MV repair.  However, he was in the hospital for knee surgery and he developed persistent atrial fib.  We saw him for this and followed on in the office in January.  The plan was for four weeks of therapeutic INR and then probable DCCV.  He comes back today to talk about this.   He feels some fatigue. He's bereaved because his sister died last month of stroke.  He denies any chest pressure, neck or arm discomfort. He's had no palpitations, presyncope or syncope. He doesn't have breath with his baseline. He does have some increased fatigue and still walking cane. atr  Allergies  Allergen Reactions  . Flomax [Tamsulosin Hcl] Other (See Comments)    Makes him "swimmy headed"    Current Outpatient Prescriptions  Medication Sig Dispense Refill  . amLODipine (NORVASC) 2.5 MG tablet Take 2.5 mg by mouth daily before breakfast.     . lisinopril (PRINIVIL,ZESTRIL) 20 MG tablet Take 1 tablet (20 mg total) by mouth daily before breakfast. 30 tablet 5  . omeprazole (PRILOSEC) 20 MG capsule Take 20 mg by mouth daily before breakfast.     . oxyCODONE-acetaminophen (ROXICET) 5-325 MG tablet Take 1 tablet by mouth every 6 (six) hours as needed for severe pain. 60 tablet 0  . pravastatin (PRAVACHOL) 20 MG tablet TAKE 1 TABLET (20 MG TOTAL) BY MOUTH EVERY EVENING. 30 tablet 11  . vitamin C (ASCORBIC ACID) 500 MG tablet Take 500 mg by mouth daily.    Marland Kitchen warfarin (COUMADIN) 5 MG tablet Take 1 to 1.5 tablets by mouth daily or as directed by coumadin clinic 40 tablet 1   No current facility-administered medications for this visit.     Past Medical History:  Diagnosis Date  . Allergy   . Arthritis    knees & hips  . BPH (benign prostatic hyperplasia)   . Coronary artery disease excluded   . Diverticulosis   . Endocarditis, valve unspecified, unspecified cause   . GERD (gastroesophageal reflux  disease)   . H/O mitral valve repair    Postoperative ring with postoperative atrial fibrillation  . Hiatal hernia 06/13/2002  . History of kidney stones    passed spontaneously x2   . Unspecified essential hypertension   . Weakness of both arms 07/19/2012    Past Surgical History:  Procedure Laterality Date  . CATARACT EXTRACTION W/PHACO Right 05/12/2015   Procedure: CATARACT EXTRACTION PHACO AND INTRAOCULAR LENS PLACEMENT RIGHT EYE CDE=7.75;  Surgeon: Tonny Branch, MD;  Location: [facility];  Service: Ophthalmology;  Laterality: Right;  . CATARACT EXTRACTION W/PHACO Left 06/12/2015   Procedure: CATARACT EXTRACTION PHACO AND INTRAOCULAR LENS PLACEMENT (IOC);  Surgeon: Tonny Branch, MD;  Location: [facility];  Service: Ophthalmology;  Laterality: Left;  CDE:  13.17  . CHOLECYSTECTOMY    . INJECTION KNEE Right 02/20/2016   Procedure: KNEE INJECTION;  Surgeon: [name], MD;  Location: [place];  Service: Orthopedics;  Laterality: Right;  . KNEE SURGERY Left   . MITRAL VALVE REPAIR    . MV repair     2003  . ROTATOR CUFF REPAIR Left   . TOTAL KNEE ARTHROPLASTY Left 02/20/2016   Procedure: LEFT TOTAL KNEE ARTHROPLASTY WITH RIGHT KNEE INJECTION;  Surgeon: [name], MD;  Location: [facility] [place];  Service: Orthopedics;  Laterality: Left;  Marland Kitchen VASECTOMY      ROS:  As stated in the HPI and negative for all other systems.  PHYSICAL EXAM BP 129/85 (BP Location: Left Arm)   Pulse 75   Ht 5\' 11"  (1.803 m)   Wt 232 lb 3.2 oz (105.3 kg)   BMI 32.39 kg/m  GENERAL:  Well appearing NECK:  No jugular venous distention, waveform within normal limits, carotid upstroke brisk and symmetric, no bruits, no thyromegaly LYMPHATICS:  No cervical, inguinal adenopathy LUNGS:  Clear to auscultation bilaterally CHEST:  Well healed left thoracotomy scar HEART:  PMI not displaced or sustained,S1 and S2 within normal limits, no S3, no S4, no clicks, no rubs, no murmurs ABD:  Flat, positive bowel sounds normal in frequency in  pitch, no bruits, no rebound, no guarding, no midline pulsatile mass, no hepatomegaly, no splenomegaly, obese EXT:  2 plus pulses upper and decreased DP/PT bilateral. , trace edema, no cyanosis no clubbing    ASSESSMENT AND PLAN  ATRIAL FIB:  This is valvular.  He is on anticoagulation.  The first therapeutic INR was on Feb 1st.    I will schedule a DCCV for 04/15/16.  He is going to get his INR checked again on the 27th this is subtherapeutic course we would have to cancel. For now he'll remain on the meds as listed.  MITRAL VALVE REPAIR:    He had a stable MV repair in Sept.  No further imaging is planned  BRADYCARDIA:   No change in therapy is indicated.    CAROTID STENOSIS:  This was mild in the past an no imaging is indicated.   HTN:  The blood pressure is at target. No change in medications is indicated. We will continue with therapeutic lifestyle changes (TLC).    OBESITY:  The patient understands the need to lose weight with diet and exercise. We have discussed specific strategies for this.    Seen today for cardioversion.  Note as above.

## 2016-04-01 NOTE — Telephone Encounter (Signed)
Spoke with wife.  Pt has conflict with appt time. INR appt rescheduled.

## 2016-04-02 ENCOUNTER — Ambulatory Visit (INDEPENDENT_AMBULATORY_CARE_PROVIDER_SITE_OTHER): Payer: Medicare Other | Admitting: Cardiology

## 2016-04-02 ENCOUNTER — Encounter: Payer: Self-pay | Admitting: Cardiology

## 2016-04-02 VITALS — BP 129/85 | HR 75 | Ht 71.0 in | Wt 232.2 lb

## 2016-04-02 DIAGNOSIS — D689 Coagulation defect, unspecified: Secondary | ICD-10-CM

## 2016-04-02 DIAGNOSIS — I481 Persistent atrial fibrillation: Secondary | ICD-10-CM

## 2016-04-02 DIAGNOSIS — R5383 Other fatigue: Secondary | ICD-10-CM

## 2016-04-02 DIAGNOSIS — I4819 Other persistent atrial fibrillation: Secondary | ICD-10-CM

## 2016-04-02 DIAGNOSIS — Z01812 Encounter for preprocedural laboratory examination: Secondary | ICD-10-CM

## 2016-04-02 NOTE — Patient Instructions (Signed)
Medication Instructions:  Continue current medications  Labwork: Per- Op Labs  Testing/Procedures: Your physician has recommended that you have a Cardioversion (DCCV) on March 1st with Dr Percival Spanish. Electrical Cardioversion uses a jolt of electricity to your heart either through paddles or wired patches attached to your chest. This is a controlled, usually prescheduled, procedure. Defibrillation is done under light anesthesia in the hospital, and you usually go home the day of the procedure. This is done to get your heart back into a normal rhythm. You are not awake for the procedure. Please see the instruction sheet given to you today.  Follow-Up: Your physician recommends that you schedule a follow-up appointment in: After Cardioversion   Any Other Special Instructions Will Be Listed Below (If Applicable).   If you need a refill on your cardiac medications before your next appointment, please call your pharmacy.

## 2016-04-04 ENCOUNTER — Encounter: Payer: Self-pay | Admitting: Cardiology

## 2016-04-08 ENCOUNTER — Other Ambulatory Visit: Payer: Medicare Other | Admitting: *Deleted

## 2016-04-08 DIAGNOSIS — Z0389 Encounter for observation for other suspected diseases and conditions ruled out: Secondary | ICD-10-CM

## 2016-04-08 DIAGNOSIS — I10 Essential (primary) hypertension: Secondary | ICD-10-CM

## 2016-04-08 DIAGNOSIS — I480 Paroxysmal atrial fibrillation: Secondary | ICD-10-CM

## 2016-04-08 LAB — CBC WITH DIFFERENTIAL/PLATELET
BASOS ABS: 0 10*3/uL (ref 0.0–0.2)
Basos: 0 %
EOS (ABSOLUTE): 0.1 10*3/uL (ref 0.0–0.4)
Eos: 2 %
Hematocrit: 45 % (ref 37.5–51.0)
Hemoglobin: 15.2 g/dL (ref 13.0–17.7)
Immature Grans (Abs): 0.1 10*3/uL (ref 0.0–0.1)
Immature Granulocytes: 1 %
LYMPHS ABS: 1.7 10*3/uL (ref 0.7–3.1)
Lymphs: 18 %
MCH: 31.5 pg (ref 26.6–33.0)
MCHC: 33.8 g/dL (ref 31.5–35.7)
MCV: 93 fL (ref 79–97)
Monocytes Absolute: 1 10*3/uL — ABNORMAL HIGH (ref 0.1–0.9)
Monocytes: 11 %
Neutrophils Absolute: 6.6 10*3/uL (ref 1.4–7.0)
Neutrophils: 68 %
Platelets: 309 10*3/uL (ref 150–379)
RBC: 4.82 x10E6/uL (ref 4.14–5.80)
RDW: 13.4 % (ref 12.3–15.4)
WBC: 9.5 10*3/uL (ref 3.4–10.8)

## 2016-04-08 LAB — BASIC METABOLIC PANEL
BUN / CREAT RATIO: 21 (ref 10–24)
BUN: 15 mg/dL (ref 8–27)
CO2: 25 mmol/L (ref 18–29)
CREATININE: 0.7 mg/dL — AB (ref 0.76–1.27)
Calcium: 9.3 mg/dL (ref 8.6–10.2)
Chloride: 97 mmol/L (ref 96–106)
GFR calc Af Amer: 104 (ref >59)
GFR calc non Af Amer: 90 (ref >59)
GLUCOSE: 107 mg/dL — AB (ref 65–99)
Potassium: 4.2 mmol/L (ref 3.5–5.2)
SODIUM: 141 mmol/L (ref 134–144)

## 2016-04-08 LAB — PROTIME-INR
INR: 2.6 — ABNORMAL HIGH (ref 0.8–1.2)
Prothrombin Time: 25.8 s — ABNORMAL HIGH (ref 9.1–12.0)

## 2016-04-08 LAB — TSH: TSH: 2.35 u[IU]/mL (ref 0.450–4.500)

## 2016-04-08 LAB — APTT: aPTT: 36 s — ABNORMAL HIGH (ref 24–33)

## 2016-04-08 NOTE — Addendum Note (Signed)
Addended by: Eulis Foster on: 04/08/2016 10:36 AM   Modules accepted: Orders

## 2016-04-09 ENCOUNTER — Other Ambulatory Visit: Payer: Medicare Other

## 2016-04-13 ENCOUNTER — Ambulatory Visit (INDEPENDENT_AMBULATORY_CARE_PROVIDER_SITE_OTHER): Payer: Medicare Other | Admitting: *Deleted

## 2016-04-13 DIAGNOSIS — Z7901 Long term (current) use of anticoagulants: Secondary | ICD-10-CM | POA: Diagnosis not present

## 2016-04-13 DIAGNOSIS — Z5181 Encounter for therapeutic drug level monitoring: Secondary | ICD-10-CM | POA: Diagnosis not present

## 2016-04-13 DIAGNOSIS — I4891 Unspecified atrial fibrillation: Secondary | ICD-10-CM | POA: Diagnosis not present

## 2016-04-13 LAB — POCT INR: INR: 2.4

## 2016-04-14 ENCOUNTER — Encounter (HOSPITAL_COMMUNITY)
Admission: RE | Disposition: A | Discharge status: Home / Self Care | Payer: Self-pay | Source: Ambulatory Visit | Attending: Cardiology

## 2016-04-14 ENCOUNTER — Ambulatory Visit (HOSPITAL_COMMUNITY)
Admission: RE | Admit: 2016-04-14 | Discharge: 2016-04-14 | Disposition: A | Discharge status: Home / Self Care | Payer: Medicare Other | Source: Ambulatory Visit | Attending: Cardiology | Admitting: Cardiology

## 2016-04-14 ENCOUNTER — Ambulatory Visit (HOSPITAL_COMMUNITY): Payer: Medicare Other | Admitting: Certified Registered Nurse Anesthetist

## 2016-04-14 ENCOUNTER — Encounter (HOSPITAL_COMMUNITY): Payer: Self-pay | Admitting: *Deleted

## 2016-04-14 DIAGNOSIS — G4730 Sleep apnea, unspecified: Secondary | ICD-10-CM | POA: Insufficient documentation

## 2016-04-14 DIAGNOSIS — I6529 Occlusion and stenosis of unspecified carotid artery: Secondary | ICD-10-CM | POA: Insufficient documentation

## 2016-04-14 DIAGNOSIS — Z96652 Presence of left artificial knee joint: Secondary | ICD-10-CM | POA: Insufficient documentation

## 2016-04-14 DIAGNOSIS — I4891 Unspecified atrial fibrillation: Secondary | ICD-10-CM

## 2016-04-14 DIAGNOSIS — Z7901 Long term (current) use of anticoagulants: Secondary | ICD-10-CM | POA: Diagnosis not present

## 2016-04-14 DIAGNOSIS — I10 Essential (primary) hypertension: Secondary | ICD-10-CM | POA: Diagnosis not present

## 2016-04-14 DIAGNOSIS — Z87891 Personal history of nicotine dependence: Secondary | ICD-10-CM | POA: Diagnosis not present

## 2016-04-14 DIAGNOSIS — I739 Peripheral vascular disease, unspecified: Secondary | ICD-10-CM | POA: Insufficient documentation

## 2016-04-14 DIAGNOSIS — Z6832 Body mass index (BMI) 32.0-32.9, adult: Secondary | ICD-10-CM | POA: Insufficient documentation

## 2016-04-14 DIAGNOSIS — Z79899 Other long term (current) drug therapy: Secondary | ICD-10-CM | POA: Diagnosis not present

## 2016-04-14 DIAGNOSIS — I481 Persistent atrial fibrillation: Secondary | ICD-10-CM | POA: Diagnosis present

## 2016-04-14 DIAGNOSIS — I2510 Atherosclerotic heart disease of native coronary artery without angina pectoris: Secondary | ICD-10-CM | POA: Diagnosis not present

## 2016-04-14 DIAGNOSIS — K219 Gastro-esophageal reflux disease without esophagitis: Secondary | ICD-10-CM | POA: Insufficient documentation

## 2016-04-14 DIAGNOSIS — E669 Obesity, unspecified: Secondary | ICD-10-CM | POA: Diagnosis not present

## 2016-04-14 DIAGNOSIS — R001 Bradycardia, unspecified: Secondary | ICD-10-CM | POA: Insufficient documentation

## 2016-04-14 HISTORY — PX: CARDIOVERSION: SHX1299

## 2016-04-14 SURGERY — CARDIOVERSION
Anesthesia: General

## 2016-04-14 MED ORDER — PROPOFOL 10 MG/ML IV BOLUS
INTRAVENOUS | Status: DC | PRN
  Administered 2016-04-14: 70 mg via INTRAVENOUS

## 2016-04-14 MED ORDER — SODIUM CHLORIDE 0.9% FLUSH: 3.0000 mL | Freq: Two times a day (BID) | INTRAVENOUS | Status: DC

## 2016-04-14 MED ORDER — SODIUM CHLORIDE 0.9 % IV SOLN
250.0000 mL | INTRAVENOUS | Status: DC
  Administered 2016-04-14: 500 mL via INTRAVENOUS

## 2016-04-14 MED ORDER — HYDROCORTISONE 1 % EX CREA: 1.0000 "application " | TOPICAL_CREAM | Freq: Three times a day (TID) | CUTANEOUS | Status: DC | PRN

## 2016-04-14 MED ORDER — HYDROCORTISONE 1 % EX CREA: 1.0000 "application " | TOPICAL_CREAM | Freq: Three times a day (TID) | CUTANEOUS | 0 refills | Status: DC | PRN

## 2016-04-14 MED ORDER — LIDOCAINE 2% (20 MG/ML) 5 ML SYRINGE
INTRAMUSCULAR | Status: DC | PRN
  Administered 2016-04-14: 30 mg via INTRAVENOUS

## 2016-04-14 MED ORDER — SODIUM CHLORIDE 0.9% FLUSH: 3.0000 mL | INTRAVENOUS | Status: DC | PRN

## 2016-04-14 NOTE — Transfer of Care (Signed)
Immediate Anesthesia Transfer of Care Note  Patient: [name]  Procedure(s) Performed: Procedure(s): CARDIOVERSION (N/A)  Patient Location: Endoscopy Unit  Anesthesia Type:General  Level of Consciousness: awake, alert  and patient cooperative  Airway & Oxygen Therapy: Patient Spontanous Breathing  Post-op Assessment: Report given to RN and Post -op Vital signs reviewed and stable  Post vital signs: Reviewed and stable  Last Vitals:  Vitals:   04/14/16 1210 04/14/16 1217  BP: (!) 155/111   Resp: (!) 24   Temp:  36.4 C    Last Pain:  Vitals:   04/14/16 1217  TempSrc: Oral         Complications: No apparent anesthesia complications

## 2016-04-14 NOTE — Discharge Instructions (Signed)
Moderate Conscious Sedation, Adult Sedation is the use of medicines to promote relaxation and relieve discomfort and anxiety. Moderate conscious sedation is a type of sedation. Under moderate conscious sedation, you are less alert than normal, but you are still able to respond to instructions, touch, or both. Moderate conscious sedation is used during short medical and dental procedures. It is milder than deep sedation, which is a type of sedation under which you cannot be easily woken up. It is also milder than general anesthesia, which is the use of medicines to make you unconscious. Moderate conscious sedation allows you to return to your regular activities sooner. Tell a health care provider about:  Any allergies you have.  All medicines you are taking, including vitamins, herbs, eye drops, creams, and over-the-counter medicines.  Use of steroids (by mouth or creams).  Any problems you or family members have had with sedatives and anesthetic medicines.  Any blood disorders you have.  Any surgeries you have had.  Any medical conditions you have, such as sleep apnea.  Whether you are pregnant or may be pregnant.  Any use of cigarettes, alcohol, marijuana, or street drugs. What are the risks? Generally, this is a safe procedure. However, problems may occur, including:  Getting too much medicine (oversedation).  Nausea.  Allergic reaction to medicines.  Trouble breathing. If this happens, a breathing tube may be used to help with breathing. It will be removed when you are awake and breathing on your own.  Heart trouble.  Lung trouble. What happens before the procedure? Staying hydrated  Follow instructions from your health care provider about hydration, which may include:  Up to 2 hours before the procedure - you may continue to drink clear liquids, such as water, clear fruit juice, black coffee, and plain tea. Eating and drinking restrictions  Follow instructions from your  health care provider about eating and drinking, which may include:  8 hours before the procedure - stop eating heavy meals or foods such as meat, fried foods, or fatty foods.  6 hours before the procedure - stop eating light meals or foods, such as toast or cereal.  6 hours before the procedure - stop drinking milk or drinks that contain milk.  2 hours before the procedure - stop drinking clear liquids. Medicine   Ask your health care provider about:  Changing or stopping your regular medicines. This is especially important if you are taking diabetes medicines or blood thinners.  Taking medicines such as aspirin and ibuprofen. These medicines can thin your blood. Do not take these medicines before your procedure if your health care provider instructs you not to. Tests and exams   You will have a physical exam.  You may have blood tests done to show:  How well your kidneys and liver are working.  How well your blood can clot. General instructions   Plan to have someone take you home from the hospital or clinic.  If you will be going home right after the procedure, plan to have someone with you for 24 hours. What happens during the procedure?  An IV tube will be inserted into one of your veins.  Medicine to help you relax (sedative) will be given through the IV tube.  The medical or dental procedure will be performed. What happens after the procedure?  Your blood pressure, heart rate, breathing rate, and blood oxygen level will be monitored often until the medicines you were given have worn off.  Do not drive for 24  hours. This information is not intended to replace advice given to you by your health care provider. Make sure you discuss any questions you have with your health care provider. Document Released: 10/27/2000 Document Revised: 07/08/2015 Document Reviewed: 05/24/2015 Elsevier Interactive Patient Education  2017 [employer]. Electrical Cardioversion, Care  After This sheet gives you information about how to care for yourself after your procedure. Your health care provider may also give you more specific instructions. If you have problems or questions, contact your health care provider. What can I expect after the procedure? After the procedure, it is common to have:  Some redness on the skin where the shocks were given. Follow these instructions at home:  Do not drive for 24 hours if you were given a medicine to help you relax (sedative).  Take over-the-counter and prescription medicines only as told by your health care provider.  Ask your health care provider how to check your pulse. Check it often.  Rest for 48 hours after the procedure or as told by your health care provider.  Avoid or limit your caffeine use as told by your health care provider. Contact a health care provider if:  You feel like your heart is beating too quickly or your pulse is not regular.  You have a serious muscle cramp that does not go away. Get help right away if:  You have discomfort in your chest.  You are dizzy or you feel faint.  You have trouble breathing or you are short of breath.  Your speech is slurred.  You have trouble moving an arm or leg on one side of your body.  Your fingers or toes turn cold or blue. This information is not intended to replace advice given to you by your health care provider. Make sure you discuss any questions you have with your health care provider. Document Released: 11/22/2012 Document Revised: 09/05/2015 Document Reviewed: 08/08/2015 Elsevier Interactive Patient Education  2017 [employer].

## 2016-04-14 NOTE — Anesthesia Preprocedure Evaluation (Addendum)
Anesthesia Evaluation  Patient identified by MRN, date of birth, ID band Patient awake    Reviewed: Allergy & Precautions, NPO status , Patient's Chart, lab work & pertinent test results  History of Anesthesia Complications Negative for: history of anesthetic complications  Airway Mallampati: I  TM Distance: >3 FB Neck ROM: Full    Dental  (+) Missing, Dental Advisory Given,    Pulmonary shortness of breath, sleep apnea , former smoker,    breath sounds clear to auscultation       Cardiovascular hypertension, Pt. on medications + CAD and + Peripheral Vascular Disease  + dysrhythmias Atrial Fibrillation  Rhythm:Irregular Rate:Normal     Neuro/Psych    GI/Hepatic Neg liver ROS, hiatal hernia, GERD  ,  Endo/Other  negative endocrine ROS  Renal/GU negative Renal ROS     Musculoskeletal  (+) Arthritis ,   Abdominal   Peds  Hematology   Anesthesia Other Findings   Reproductive/Obstetrics                          Anesthesia Physical Anesthesia Plan  ASA: III  Anesthesia Plan: General   Post-op Pain Management:    Induction: Intravenous  Airway Management Planned: Mask and Simple Face Mask  Additional Equipment:   Intra-op Plan:   Post-operative Plan:   Informed Consent:   Dental advisory given  Plan Discussed with: CRNA, Anesthesiologist and Surgeon  Anesthesia Plan Comments:         Anesthesia Quick Evaluation

## 2016-04-14 NOTE — Anesthesia Postprocedure Evaluation (Signed)
Anesthesia Post Note  Patient: [name]  Procedure(s) Performed: Procedure(s) (LRB): CARDIOVERSION (N/A)  Patient location during evaluation: Endoscopy Anesthesia Type: General Pain management: pain level controlled Vital Signs Assessment: post-procedure vital signs reviewed and stable Respiratory status: spontaneous breathing Cardiovascular status: stable Anesthetic complications: no       Last Vitals:  Vitals:   04/14/16 1323 04/14/16 1330  BP: (!) 144/80 125/79  Pulse: 87 74  Resp: (!) 21 (!) 24  Temp: 36.6 C     Last Pain:  Vitals:   04/14/16 1323  TempSrc: Oral                 Evelina Lore

## 2016-04-14 NOTE — Procedures (Signed)
   Procedure:   DCCV  Indication:  Symptomatic atrial fib.  Procedure Note:  The patient signed informed consent.  He has had had therapeutic anticoagulation with Warfarin greater than 3 weeks.  Anesthesia was administered by Dr. Nyoka Cowden.  Adequate airway was maintained throughout and vital followed per protocol.  He was cardioverted x 1 with 120 J of biphasic synchronized energy.  He converted to NSR.  There were no apparent complications.  The patient had normal neuro status and respiratory status post procedure with vitals stable as recorded elsewhere.    Follow up:  We will arrange follow up with me in one month.  He will continue on current medical therapy.

## 2016-04-15 NOTE — Addendum Note (Signed)
Addendum  created 04/15/16 2157 by Belinda Block, MD   Anesthesia Attestations filed, Anesthesia Review and Sign - Signed, Sign clinical note

## 2016-04-20 ENCOUNTER — Ambulatory Visit (INDEPENDENT_AMBULATORY_CARE_PROVIDER_SITE_OTHER): Payer: Medicare Other | Admitting: *Deleted

## 2016-04-20 DIAGNOSIS — Z5181 Encounter for therapeutic drug level monitoring: Secondary | ICD-10-CM

## 2016-04-20 DIAGNOSIS — Z7901 Long term (current) use of anticoagulants: Secondary | ICD-10-CM

## 2016-04-20 DIAGNOSIS — I481 Persistent atrial fibrillation: Secondary | ICD-10-CM

## 2016-04-20 DIAGNOSIS — I4819 Other persistent atrial fibrillation: Secondary | ICD-10-CM

## 2016-04-20 LAB — POCT INR: INR: 2.9

## 2016-04-22 NOTE — Progress Notes (Signed)
HPI  The patient presents for evaluation of mitral valve repair.  Echo 10/2015 demonstrated stable MV repair.  While in the hospital earlier this year for knee surgery and he developed persistent atrial fib.  He is now status post DCCV.  Unfortunately he is back in atrial fibrillation. They noticed this when taking his heart rate. He doesn't really feel it.  The patient denies any new symptoms such as chest discomfort, neck or arm discomfort. There has been no new shortness of breath, PND or orthopnea. There have been no reported palpitations, presyncope or syncope.  Allergies  Allergen Reactions  . Flomax [Tamsulosin Hcl] Other (See Comments)    Makes him "swimmy headed"    Current Outpatient Prescriptions  Medication Sig Dispense Refill  . amLODipine (NORVASC) 2.5 MG tablet Take 2.5 mg by mouth daily before breakfast.     . hydrocortisone cream 1 % Apply 1 application topically 3 (three) times daily as needed for itching (skin irritation). 30 g 0  . lisinopril (PRINIVIL,ZESTRIL) 20 MG tablet Take 1 tablet (20 mg total) by mouth daily before breakfast. 30 tablet 5  . omeprazole (PRILOSEC) 20 MG capsule Take 20 mg by mouth daily before breakfast.     . oxyCODONE-acetaminophen (ROXICET) 5-325 MG tablet Take 1 tablet by mouth every 6 (six) hours as needed for severe pain. 60 tablet 0  . pravastatin (PRAVACHOL) 20 MG tablet TAKE 1 TABLET (20 MG TOTAL) BY MOUTH EVERY EVENING. 30 tablet 11  . vitamin C (ASCORBIC ACID) 500 MG tablet Take 500 mg by mouth daily.    Marland Kitchen warfarin (COUMADIN) 5 MG tablet Take 1 to 1.5 tablets by mouth daily or as directed by coumadin clinic 40 tablet 1   No current facility-administered medications for this visit.     Past Medical History:  Diagnosis Date  . Allergy   . Arthritis    knees & hips  . BPH (benign prostatic hyperplasia)   . Coronary artery disease excluded   . Diverticulosis   . Endocarditis, valve unspecified, unspecified cause   . GERD  (gastroesophageal reflux disease)   . H/O mitral valve repair    Postoperative ring with postoperative atrial fibrillation  . Hiatal hernia 06/13/2002  . History of kidney stones    passed spontaneously x2   . Unspecified essential hypertension   . Weakness of both arms 07/19/2012    Past Surgical History:  Procedure Laterality Date  . CARDIOVERSION N/A 04/14/2016   Procedure: CARDIOVERSION;  Surgeon: Minus Breeding, MD;  Location: [facility] ENDOSCOPY;  Service: Cardiovascular;  Laterality: N/A;  . CATARACT EXTRACTION W/PHACO Right 05/12/2015   Procedure: CATARACT EXTRACTION PHACO AND INTRAOCULAR LENS PLACEMENT RIGHT EYE CDE=7.75;  Surgeon: Tonny Branch, MD;  Location: [facility];  Service: Ophthalmology;  Laterality: Right;  . CATARACT EXTRACTION W/PHACO Left 06/12/2015   Procedure: CATARACT EXTRACTION PHACO AND INTRAOCULAR LENS PLACEMENT (IOC);  Surgeon: Tonny Branch, MD;  Location: [facility];  Service: Ophthalmology;  Laterality: Left;  CDE:  13.17  . CHOLECYSTECTOMY    . INJECTION KNEE Right 02/20/2016   Procedure: KNEE INJECTION;  Surgeon: [name], MD;  Location: [place];  Service: Orthopedics;  Laterality: Right;  . KNEE SURGERY Left   . MITRAL VALVE REPAIR    . MV repair     2003  . ROTATOR CUFF REPAIR Left   . TOTAL KNEE ARTHROPLASTY Left 02/20/2016   Procedure: LEFT TOTAL KNEE ARTHROPLASTY WITH RIGHT KNEE INJECTION;  Surgeon: [name], MD;  Location:  [place] [facility];  Service: Orthopedics;  Laterality: Left;  Marland Kitchen VASECTOMY      ROS:      As stated in the HPI and negative for all other systems.  PHYSICAL EXAM BP 132/82   Pulse 82   Ht 5\' 11"  (1.803 m)   Wt 233 lb 6.4 oz (105.9 kg)   BMI 32.55 kg/m  GENERAL:  Well appearing NECK:  No jugular venous distention, waveform within normal limits, carotid upstroke brisk and symmetric, no bruits, no thyromegaly LYMPHATICS:  No cervical, inguinal adenopathy LUNGS:  Clear to auscultation bilaterally CHEST:  Well healed left thoracotomy scar HEART:  PMI not  displaced or sustained,S1 and S2 within normal limits, no S3, no S4, no clicks, no rubs, no murmurs ABD:  Flat, positive bowel sounds normal in frequency in pitch, no bruits, no rebound, no guarding, no midline pulsatile mass, no hepatomegaly, no splenomegaly, obese EXT:  2 plus pulses upper and decreased DP/PT bilateral. , trace edema, no cyanosis no clubbing  EKG:  Atrial fibrillation, rate 83, axis within normal limits, intervals within normal limits, low voltage in the limb leads.  ASSESSMENT AND PLAN  ATRIAL FIB:  He is back in fibrillation but he doesn't feel this. I'll begin needs to be in sinus rhythm as he would not be able to come off anticoagulation. He tolerates warfarin. He'll continue with this and I will get a 24-hour monitor to make sure he has good rate control.      MITRAL VALVE REPAIR:    He had a stable MV repair in Sept.  No further imaging is planned  BRADYCARDIA:   No change in therapy is indicated.    CAROTID STENOSIS:  This was mild in the past an no imaging is indicated.   HTN:  The blood pressure is at target. No change in medications is indicated. We will continue with therapeutic lifestyle changes (TLC).    OBESITY:  The patient understands the need to lose weight with diet and exercise. We have discussed specific strategies for this.      Seen today for cardioversion.  Note as above.

## 2016-04-23 ENCOUNTER — Encounter: Payer: Self-pay | Admitting: Cardiology

## 2016-04-23 ENCOUNTER — Ambulatory Visit (INDEPENDENT_AMBULATORY_CARE_PROVIDER_SITE_OTHER): Payer: Medicare Other | Admitting: Cardiology

## 2016-04-23 VITALS — BP 132/82 | HR 82 | Ht 71.0 in | Wt 233.4 lb

## 2016-04-23 DIAGNOSIS — I4819 Other persistent atrial fibrillation: Secondary | ICD-10-CM

## 2016-04-23 DIAGNOSIS — I481 Persistent atrial fibrillation: Secondary | ICD-10-CM | POA: Diagnosis not present

## 2016-04-23 NOTE — Patient Instructions (Signed)
Medication Instructions:  Continue current medications  Labwork: None Ordered  Testing/Procedures: Your physician has recommended that you wear a 24 hour holter monitor. Holter monitors are medical devices that record the heart's electrical activity. Doctors most often use these monitors to diagnose arrhythmias. Arrhythmias are problems with the speed or rhythm of the heartbeat. The monitor is a small, portable device. You can wear one while you do your normal daily activities. This is usually used to diagnose what is causing palpitations/syncope (passing out).  Follow-Up: Your physician recommends that you schedule a follow-up appointment in: 2 Months   Any Other Special Instructions Will Be Listed Below (If Applicable).   If you need a refill on your cardiac medications before your next appointment, please call your pharmacy.

## 2016-04-24 ENCOUNTER — Encounter: Payer: Self-pay | Admitting: Cardiology

## 2016-04-27 ENCOUNTER — Ambulatory Visit (INDEPENDENT_AMBULATORY_CARE_PROVIDER_SITE_OTHER): Payer: Medicare Other | Admitting: *Deleted

## 2016-04-27 DIAGNOSIS — I481 Persistent atrial fibrillation: Secondary | ICD-10-CM

## 2016-04-27 DIAGNOSIS — Z7901 Long term (current) use of anticoagulants: Secondary | ICD-10-CM

## 2016-04-27 DIAGNOSIS — Z5181 Encounter for therapeutic drug level monitoring: Secondary | ICD-10-CM | POA: Diagnosis not present

## 2016-04-27 DIAGNOSIS — I4891 Unspecified atrial fibrillation: Secondary | ICD-10-CM | POA: Diagnosis not present

## 2016-04-27 DIAGNOSIS — I4819 Other persistent atrial fibrillation: Secondary | ICD-10-CM

## 2016-04-27 LAB — POCT INR: INR: 2.7

## 2016-04-29 ENCOUNTER — Encounter (INDEPENDENT_AMBULATORY_CARE_PROVIDER_SITE_OTHER): Payer: Self-pay | Admitting: Orthopaedic Surgery

## 2016-04-29 ENCOUNTER — Ambulatory Visit (INDEPENDENT_AMBULATORY_CARE_PROVIDER_SITE_OTHER): Payer: Medicare Other | Admitting: Orthopaedic Surgery

## 2016-04-29 VITALS — BP 123/81 | HR 82 | Ht 71.0 in | Wt 228.0 lb

## 2016-04-29 DIAGNOSIS — Z96652 Presence of left artificial knee joint: Secondary | ICD-10-CM | POA: Diagnosis not present

## 2016-04-29 NOTE — Progress Notes (Addendum)
Post-Op Visit Note   Patient: [name]           Date of Birth: [birth year]           MRN: [id number] Visit Date: 04/29/2016 PCP: Sherrie Mustache, MD   Assessment & Plan:  Chief Complaint:  Chief Complaint  Patient presents with  . Left Knee - Routine Post Op   Visit Diagnoses:  1. S/P total knee arthroplasty, left     Plan: Patient is making improvement after total knee arthroplasty 02/20/2016. He still lacks about 3-4 full extension we discussed prone positioning work at home so in get full extension and he also needs 6 more work on his quad. His flexion is excellent. I gave him some additional exercises that he can use with a step at home. He still in therapy. Recheck 5 weeks Patient on coumadin for afib which is present today. He will likely need bridging when his other right TKA is done in several months. He needs further rehab on his left knee before he could proceed.  Follow-Up Instructions: Return in about 5 weeks (around 06/03/2016).   Orders:  No orders of the defined types were placed in this encounter.  No orders of the defined types were placed in this encounter.  HPI Patient returns for four week follow up. He is status post left TKA on 02/20/2016. He is 69 days post op. He states that he is doing ok. Has continued difficulty getting out of the car. He has continued physical therapy. He is taking tylenol arthritis with relief.   Imaging: No results found.  PMFS History: Patient Active Problem List   Diagnosis Date Noted  . Arthritis of left knee 02/20/2016    Priority: High  . Unilateral primary osteoarthritis, right knee 01/15/2016    Priority: Medium  . Monitoring for long-term anticoagulant use 03/15/2016  . Weakness of both arms 07/19/2012  . Colon cancer screening 06/22/2012  . Unspecified venous (peripheral) insufficiency 05/05/2012  . Chronic venous insufficiency 04/07/2012  . Other malaise and fatigue 03/16/2012  . Disturbance of skin  sensation 03/16/2012  . Carotid stenosis 08/16/2011  . Obesity 08/16/2011  . Coronary artery disease excluded   . H/O mitral valve repair   . Endocarditis, valve unspecified, unspecified cause   . Dizziness 12/16/2010  . Dyslipidemia 09/23/2010  . Essential hypertension 04/06/2007  . ATRIAL FIBRILLATION 04/06/2007  . OBSTRUCTIVE SLEEP APNEA 04/06/2007  . DYSPNEA 04/06/2007   Past Medical History:  Diagnosis Date  . Allergy   . Arthritis    knees & hips  . BPH (benign prostatic hyperplasia)   . Coronary artery disease excluded   . Diverticulosis   . Endocarditis, valve unspecified, unspecified cause   . GERD (gastroesophageal reflux disease)   . H/O mitral valve repair    Postoperative ring with postoperative atrial fibrillation  . Hiatal hernia 06/13/2002  . History of kidney stones    passed spontaneously x2   . Unspecified essential hypertension   . Weakness of both arms 07/19/2012    Family History  Problem Relation Age of Onset  . Congestive Heart Failure Father   . Heart disease Father   . Stroke Brother   . Stroke Sister   . Diabetes Mellitus II Brother   . Diabetes Mellitus II Sister   . Lung cancer Brother   . Colon cancer Neg Hx   . Esophageal cancer Neg Hx   . Rectal cancer Neg Hx   . Stomach cancer Neg  Hx     Past Surgical History:  Procedure Laterality Date  . CARDIOVERSION N/A 04/14/2016   Procedure: CARDIOVERSION;  Surgeon: Minus Breeding, MD;  Location: [facility] ENDOSCOPY;  Service: Cardiovascular;  Laterality: N/A;  . CATARACT EXTRACTION W/PHACO Right 05/12/2015   Procedure: CATARACT EXTRACTION PHACO AND INTRAOCULAR LENS PLACEMENT RIGHT EYE CDE=7.75;  Surgeon: Tonny Branch, MD;  Location: [facility];  Service: Ophthalmology;  Laterality: Right;  . CATARACT EXTRACTION W/PHACO Left 06/12/2015   Procedure: CATARACT EXTRACTION PHACO AND INTRAOCULAR LENS PLACEMENT (IOC);  Surgeon: Tonny Branch, MD;  Location: [facility];  Service: Ophthalmology;  Laterality: Left;  CDE:  13.17   . CHOLECYSTECTOMY    . INJECTION KNEE Right 02/20/2016   Procedure: KNEE INJECTION;  Surgeon: [name], MD;  Location: [place];  Service: Orthopedics;  Laterality: Right;  . KNEE SURGERY Left   . MITRAL VALVE REPAIR    . MV repair     2003  . ROTATOR CUFF REPAIR Left   . TOTAL KNEE ARTHROPLASTY Left 02/20/2016   Procedure: LEFT TOTAL KNEE ARTHROPLASTY WITH RIGHT KNEE INJECTION;  Surgeon: [name], MD;  Location: [place];  Service: Orthopedics;  Laterality: Left;  Marland Kitchen VASECTOMY     Social History   Occupational History  .  Machine Music therapist     RETIRED   Social History Main Topics  . Smoking status: Former Smoker    Packs/day: 3.00    Years: 35.00    Types: Cigarettes    Quit date: 02/16/1988  . Smokeless tobacco: Never Used     Comment:  Year Quit: 1990   . Alcohol use No  . Drug use: No  . Sexual activity: Not on file

## 2016-05-04 ENCOUNTER — Ambulatory Visit (INDEPENDENT_AMBULATORY_CARE_PROVIDER_SITE_OTHER): Payer: Medicare Other | Admitting: *Deleted

## 2016-05-04 DIAGNOSIS — I481 Persistent atrial fibrillation: Secondary | ICD-10-CM

## 2016-05-04 DIAGNOSIS — Z5181 Encounter for therapeutic drug level monitoring: Secondary | ICD-10-CM

## 2016-05-04 DIAGNOSIS — Z7901 Long term (current) use of anticoagulants: Secondary | ICD-10-CM

## 2016-05-04 DIAGNOSIS — I4819 Other persistent atrial fibrillation: Secondary | ICD-10-CM

## 2016-05-04 LAB — POCT INR: INR: 2.6

## 2016-05-05 ENCOUNTER — Other Ambulatory Visit: Payer: Self-pay | Admitting: Pharmacist Clinician (PhC)/ Clinical Pharmacy Specialist

## 2016-05-05 MED ORDER — WARFARIN SODIUM 5 MG PO TABS: ORAL_TABLET | ORAL | 0 refills | Status: DC

## 2016-05-10 ENCOUNTER — Ambulatory Visit (INDEPENDENT_AMBULATORY_CARE_PROVIDER_SITE_OTHER): Payer: Medicare Other

## 2016-05-10 DIAGNOSIS — I4819 Other persistent atrial fibrillation: Secondary | ICD-10-CM

## 2016-05-10 DIAGNOSIS — I481 Persistent atrial fibrillation: Secondary | ICD-10-CM

## 2016-05-18 ENCOUNTER — Ambulatory Visit (INDEPENDENT_AMBULATORY_CARE_PROVIDER_SITE_OTHER): Payer: Medicare Other | Admitting: *Deleted

## 2016-05-18 DIAGNOSIS — I481 Persistent atrial fibrillation: Secondary | ICD-10-CM

## 2016-05-18 DIAGNOSIS — I4819 Other persistent atrial fibrillation: Secondary | ICD-10-CM

## 2016-05-18 DIAGNOSIS — Z5181 Encounter for therapeutic drug level monitoring: Secondary | ICD-10-CM

## 2016-05-18 DIAGNOSIS — Z7901 Long term (current) use of anticoagulants: Secondary | ICD-10-CM

## 2016-05-18 LAB — POCT INR: INR: 2.4

## 2016-05-27 NOTE — Progress Notes (Signed)
HPI  The patient presents for evaluation of mitral valve repair.  Echo 10/2015 demonstrated stable MV repair.  While in the hospital earlier this year for knee surgery and he developed persistent atrial fib.  He is now status post DCCV.  Unfortunately when I saw him recently he was back in atrial fibrillation. He wore a Holter monitor which demonstrated frequent 3.5 second pauses at night. His average heart rate was about 80. He doesn't feel any palpitations, presyncope or syncope. He does have daytime somnolence. His wife reports snoring and apneic episodes. He was supposed to have sleep studies years ago but he never had these.   Allergies  Allergen Reactions  . Flomax [Tamsulosin Hcl] Other (See Comments)    Makes him "swimmy headed"    Current Outpatient Prescriptions  Medication Sig Dispense Refill  . amLODipine (NORVASC) 2.5 MG tablet Take 2.5 mg by mouth daily before breakfast.     . hydrocortisone cream 1 % Apply 1 application topically 3 (three) times daily as needed for itching (skin irritation). 30 g 0  . lisinopril (PRINIVIL,ZESTRIL) 20 MG tablet Take 1 tablet (20 mg total) by mouth daily before breakfast. 30 tablet 5  . omeprazole (PRILOSEC) 20 MG capsule Take 20 mg by mouth daily before breakfast.     . oxyCODONE-acetaminophen (ROXICET) 5-325 MG tablet Take 1 tablet by mouth every 6 (six) hours as needed for severe pain. (Patient not taking: Reported on 04/29/2016) 60 tablet 0  . pravastatin (PRAVACHOL) 20 MG tablet TAKE 1 TABLET (20 MG TOTAL) BY MOUTH EVERY EVENING. 30 tablet 11  . vitamin C (ASCORBIC ACID) 500 MG tablet Take 500 mg by mouth daily.    Marland Kitchen warfarin (COUMADIN) 5 MG tablet Take 1 to 1.5 tablets by mouth daily or as directed by coumadin clinic 120 tablet 0   No current facility-administered medications for this visit.     Past Medical History:  Diagnosis Date  . Allergy   . Arthritis    knees & hips  . BPH (benign prostatic hyperplasia)   . Coronary artery  disease excluded   . Diverticulosis   . Endocarditis, valve unspecified, unspecified cause   . GERD (gastroesophageal reflux disease)   . H/O mitral valve repair    Postoperative ring with postoperative atrial fibrillation  . Hiatal hernia 06/13/2002  . History of kidney stones    passed spontaneously x2   . Unspecified essential hypertension   . Weakness of both arms 07/19/2012    Past Surgical History:  Procedure Laterality Date  . CARDIOVERSION N/A 04/14/2016   Procedure: CARDIOVERSION;  Surgeon: Minus Breeding, MD;  Location: [facility] ENDOSCOPY;  Service: Cardiovascular;  Laterality: N/A;  . CATARACT EXTRACTION W/PHACO Right 05/12/2015   Procedure: CATARACT EXTRACTION PHACO AND INTRAOCULAR LENS PLACEMENT RIGHT EYE CDE=7.75;  Surgeon: Tonny Branch, MD;  Location: [facility];  Service: Ophthalmology;  Laterality: Right;  . CATARACT EXTRACTION W/PHACO Left 06/12/2015   Procedure: CATARACT EXTRACTION PHACO AND INTRAOCULAR LENS PLACEMENT (IOC);  Surgeon: Tonny Branch, MD;  Location: [facility];  Service: Ophthalmology;  Laterality: Left;  CDE:  13.17  . CHOLECYSTECTOMY    . INJECTION KNEE Right 02/20/2016   Procedure: KNEE INJECTION;  Surgeon: [name], MD;  Location: [place];  Service: Orthopedics;  Laterality: Right;  . KNEE SURGERY Left   . MITRAL VALVE REPAIR    . MV repair     2003  . ROTATOR CUFF REPAIR Left   . TOTAL KNEE ARTHROPLASTY Left 02/20/2016  Procedure: LEFT TOTAL KNEE ARTHROPLASTY WITH RIGHT KNEE INJECTION;  Surgeon: [name], MD;  Location: [place];  Service: Orthopedics;  Laterality: Left;  Marland Kitchen VASECTOMY      ROS:      As stated in the HPI and negative for all other systems.  PHYSICAL EXAM There were no vitals taken for this visit. GENERAL:  Well appearing NECK:  No jugular venous distention, waveform within normal limits, carotid upstroke brisk and symmetric, no bruits, no thyromegaly LYMPHATICS:  No cervical, inguinal adenopathy LUNGS:  Clear to auscultation bilaterally CHEST:   Well healed left thoracotomy scar HEART:  PMI not displaced or sustained,S1 and S2 within normal limits, no S3, no S4, no clicks, no rubs, no murmurs ABD:  Flat, positive bowel sounds normal in frequency in pitch, no bruits, no rebound, no guarding, no midline pulsatile mass, no hepatomegaly, no splenomegaly, obese EXT:  2 plus pulses upper and decreased DP/PT bilateral. , trace edema, no cyanosis no clubbing  EKG:  Atrial fibrillation, rate 72, axis within normal limits, intervals within normal limits, low voltage in the limb leads.  05/28/2016  ASSESSMENT AND PLAN  ATRIAL FIB:    He has atrial fibrillation and supper typically symptomatic. He tolerates anticoagulation.  Mr. LADARRION TELFAIR has a CHA2DS2 - VASc score of 5.  He has some bradycardic rates I suspect this is related to untreated undiagnosed sleep apnea. This will be addressed as below.  SLEEP APNEA:  He has snoring, apneic episodes, a high sleepiness score and will have a sleep study.  MITRAL VALVE REPAIR:    He had a stable MV repair in Sept.  No further imaging is planned.  I will follow this clinically.   CAROTID STENOSIS:  This was mild in the past an no imaging is indicated.    HTN:  The blood pressure is at target. He will continue on meds as listed.   OBESITY:   The patient understands the need to lose weight with diet and exercise.

## 2016-05-28 ENCOUNTER — Encounter: Payer: Self-pay | Admitting: Cardiology

## 2016-05-28 ENCOUNTER — Ambulatory Visit (INDEPENDENT_AMBULATORY_CARE_PROVIDER_SITE_OTHER): Payer: Medicare Other | Admitting: Cardiology

## 2016-05-28 VITALS — BP 138/72 | HR 70 | Ht 71.0 in | Wt 241.0 lb

## 2016-05-28 DIAGNOSIS — I482 Chronic atrial fibrillation: Secondary | ICD-10-CM | POA: Diagnosis not present

## 2016-05-28 DIAGNOSIS — G4730 Sleep apnea, unspecified: Secondary | ICD-10-CM

## 2016-05-28 DIAGNOSIS — I10 Essential (primary) hypertension: Secondary | ICD-10-CM

## 2016-05-28 DIAGNOSIS — I4821 Permanent atrial fibrillation: Secondary | ICD-10-CM

## 2016-05-28 NOTE — Patient Instructions (Signed)
Medication Instructions:   NO CHANGE  Testing/Procedures:  Your physician has recommended that you have a sleep study. This test records several body functions during sleep, including: brain activity, eye movement, oxygen and carbon dioxide blood levels, heart rate and rhythm, breathing rate and rhythm, the flow of air through your mouth and nose, snoring, body muscle movements, and chest and belly movement.    Follow-Up:  Your physician recommends that you schedule a follow-up appointment in: [place]

## 2016-05-30 ENCOUNTER — Encounter: Payer: Self-pay | Admitting: Cardiology

## 2016-05-31 ENCOUNTER — Telehealth: Payer: Self-pay | Admitting: Cardiology

## 2016-05-31 NOTE — Telephone Encounter (Signed)
Spoke with pt wife, aware appointment has been changed until after the sleep study is complete.

## 2016-05-31 NOTE — Telephone Encounter (Signed)
Patient wife calling,states that her husband came on 05-28-16 to see Dr. Percival Spanish and is currently scheduled to see him on 06-25-16. Patient wife states that he did not need to come in until after the sleep study so she would like to know if patient still needs to keep the appt scheduled for 06-25-16. Thanks.

## 2016-06-03 ENCOUNTER — Encounter (INDEPENDENT_AMBULATORY_CARE_PROVIDER_SITE_OTHER): Payer: Self-pay | Admitting: Orthopaedic Surgery

## 2016-06-03 ENCOUNTER — Ambulatory Visit (INDEPENDENT_AMBULATORY_CARE_PROVIDER_SITE_OTHER): Payer: Medicare Other | Admitting: Orthopaedic Surgery

## 2016-06-03 VITALS — BP 130/85 | HR 72 | Ht 71.0 in | Wt 232.0 lb

## 2016-06-03 DIAGNOSIS — Z96652 Presence of left artificial knee joint: Secondary | ICD-10-CM | POA: Diagnosis not present

## 2016-06-03 NOTE — Progress Notes (Addendum)
Office Visit Note   Patient: [name]           Date of Birth: 07/21/1935           MRN: [id number] Visit Date: 06/03/2016              Requested by: Dione Housekeeper, MD [address] [place], [place] [address] PCP: Sherrie Mustache, MD   Assessment & Plan: Visit Diagnoses:  1. S/P total knee arthroplasty, left     Plan: Patient has done well with quad strengthening. He has excellent flexion. He needs to work on the last 2-3 of extension. He'll transition to the gym. I congratulated him on excellent effort and postop rehabilitation. He can return when he is ready for  treatment for his right knee.  Follow-Up Instructions: Return if symptoms worsen or fail to improve.   Orders:  No orders of the defined types were placed in this encounter.  No orders of the defined types were placed in this encounter.     Procedures: No procedures performed   Clinical Data: No additional findings.   Subjective: Chief Complaint  Patient presents with  . Left Knee - Routine Post Op    HPI patient returns post left total knee arthroplasty. He can step up on a single step using his left leg only. He lacks about 2 reaching full extension is still working on this. Please have pain when he tries prone positioning with weight on his ankle. He's going to transition to the gym for further quad strengthening. He has minimal problems with his opposite right knee.Patient is on Coumadin at this point with previous mitral valve surgery and with atrial fib. Originally he had cardioversion lasted about a week. He is being followed carefully by cardiology.  Review of Systems 14 point review of systems is obtained and updated and is unchanged from previous office visit and his surgery other than as mentioned above.   Objective: Vital Signs: BP 130/85   Pulse 72   Ht 5\' 11"  (1.803 m)   Wt 232 lb (105.2 kg)   BMI 32.36 kg/m   Physical Exam  Constitutional: He is oriented to person,  place, and time. He appears well-developed and well-nourished.  HENT:  Head: Normocephalic and atraumatic.  Eyes: EOM are normal. Pupils are equal, round, and reactive to light.  Neck: No tracheal deviation present. No thyromegaly present.  Cardiovascular:  Patient is in atrial fibrillation with a regular rate.  Pulmonary/Chest: Effort normal. He has no wheezes.  Abdominal: Soft. Bowel sounds are normal.  Musculoskeletal:  Patient is well healed midline incision left knee. He has a 2 to 116. Ligaments are balance is walking without a limp. No significant swelling opposite right knee. Distal pulses are intact no edema.  Neurological: He is alert and oriented to person, place, and time.  Skin: Skin is warm and dry. Capillary refill takes less than 2 seconds.  Psychiatric: He has a normal mood and affect. His behavior is normal. Judgment and thought content normal.    Ortho Exam  Specialty Comments:  No specialty comments available.  Imaging: No results found.   PMFS History: Patient Active Problem List   Diagnosis Date Noted  . S/P total knee arthroplasty, left 04/29/2016  . Monitoring for long-term anticoagulant use 03/15/2016  . Weakness of both arms 07/19/2012  . Colon cancer screening 06/22/2012  . Unspecified venous (peripheral) insufficiency 05/05/2012  . Chronic venous insufficiency 04/07/2012  . Other malaise and fatigue 03/16/2012  .  Disturbance of skin sensation 03/16/2012  . Carotid stenosis 08/16/2011  . Obesity 08/16/2011  . Coronary artery disease excluded   . H/O mitral valve repair   . Endocarditis, valve unspecified, unspecified cause   . Dizziness 12/16/2010  . Dyslipidemia 09/23/2010  . Essential hypertension 04/06/2007  . ATRIAL FIBRILLATION 04/06/2007  . Sleep apnea 04/06/2007  . DYSPNEA 04/06/2007   Past Medical History:  Diagnosis Date  . Allergy   . Arthritis    knees & hips  . BPH (benign prostatic hyperplasia)   . Coronary artery disease  excluded   . Diverticulosis   . Endocarditis, valve unspecified, unspecified cause   . GERD (gastroesophageal reflux disease)   . H/O mitral valve repair    Postoperative ring with postoperative atrial fibrillation  . Hiatal hernia 06/13/2002  . History of kidney stones    passed spontaneously x2   . Unspecified essential hypertension   . Weakness of both arms 07/19/2012    Family History  Problem Relation Age of Onset  . Congestive Heart Failure Father   . Heart disease Father   . Stroke Brother   . Stroke Sister   . Diabetes Mellitus II Brother   . Diabetes Mellitus II Sister   . Lung cancer Brother   . Colon cancer Neg Hx   . Esophageal cancer Neg Hx   . Rectal cancer Neg Hx   . Stomach cancer Neg Hx     Past Surgical History:  Procedure Laterality Date  . CARDIOVERSION N/A 04/14/2016   Procedure: CARDIOVERSION;  Surgeon: Minus Breeding, MD;  Location: [facility] ENDOSCOPY;  Service: Cardiovascular;  Laterality: N/A;  . CATARACT EXTRACTION W/PHACO Right 05/12/2015   Procedure: CATARACT EXTRACTION PHACO AND INTRAOCULAR LENS PLACEMENT RIGHT EYE CDE=7.75;  Surgeon: Tonny Branch, MD;  Location: [facility];  Service: Ophthalmology;  Laterality: Right;  . CATARACT EXTRACTION W/PHACO Left 06/12/2015   Procedure: CATARACT EXTRACTION PHACO AND INTRAOCULAR LENS PLACEMENT (IOC);  Surgeon: Tonny Branch, MD;  Location: [facility];  Service: Ophthalmology;  Laterality: Left;  CDE:  13.17  . CHOLECYSTECTOMY    . INJECTION KNEE Right 02/20/2016   Procedure: KNEE INJECTION;  Surgeon: [name], MD;  Location: [place];  Service: Orthopedics;  Laterality: Right;  . KNEE SURGERY Left   . MITRAL VALVE REPAIR    . MV repair     2003  . ROTATOR CUFF REPAIR Left   . TOTAL KNEE ARTHROPLASTY Left 02/20/2016   Procedure: LEFT TOTAL KNEE ARTHROPLASTY WITH RIGHT KNEE INJECTION;  Surgeon: [name], MD;  Location: [place];  Service: Orthopedics;  Laterality: Left;  Marland Kitchen VASECTOMY     Social History   Occupational History  .   Machine Music therapist     RETIRED   Social History Main Topics  . Smoking status: Former Smoker    Packs/day: 3.00    Years: 35.00    Types: Cigarettes    Quit date: 02/16/1988  . Smokeless tobacco: Never Used     Comment:  Year Quit: 1990   . Alcohol use No  . Drug use: No  . Sexual activity: Not on file

## 2016-06-15 ENCOUNTER — Ambulatory Visit (INDEPENDENT_AMBULATORY_CARE_PROVIDER_SITE_OTHER): Payer: Medicare Other | Admitting: *Deleted

## 2016-06-15 DIAGNOSIS — Z5181 Encounter for therapeutic drug level monitoring: Secondary | ICD-10-CM | POA: Diagnosis not present

## 2016-06-15 DIAGNOSIS — Z7901 Long term (current) use of anticoagulants: Secondary | ICD-10-CM | POA: Diagnosis not present

## 2016-06-15 DIAGNOSIS — I481 Persistent atrial fibrillation: Secondary | ICD-10-CM

## 2016-06-15 DIAGNOSIS — I4819 Other persistent atrial fibrillation: Secondary | ICD-10-CM

## 2016-06-15 LAB — POCT INR: INR: 2.7

## 2016-06-25 ENCOUNTER — Ambulatory Visit: Payer: Medicare Other | Admitting: Cardiology

## 2016-07-14 ENCOUNTER — Encounter: Payer: Self-pay | Admitting: Cardiology

## 2016-07-21 ENCOUNTER — Ambulatory Visit (HOSPITAL_BASED_OUTPATIENT_CLINIC_OR_DEPARTMENT_OTHER): Payer: Medicare Other | Attending: Cardiology | Admitting: Cardiovascular Disease

## 2016-07-21 VITALS — Ht 71.0 in | Wt 235.0 lb

## 2016-07-21 DIAGNOSIS — Z79899 Other long term (current) drug therapy: Secondary | ICD-10-CM | POA: Insufficient documentation

## 2016-07-21 DIAGNOSIS — I10 Essential (primary) hypertension: Secondary | ICD-10-CM | POA: Insufficient documentation

## 2016-07-21 DIAGNOSIS — I4821 Permanent atrial fibrillation: Secondary | ICD-10-CM

## 2016-07-21 DIAGNOSIS — Z7901 Long term (current) use of anticoagulants: Secondary | ICD-10-CM | POA: Insufficient documentation

## 2016-07-21 DIAGNOSIS — G4733 Obstructive sleep apnea (adult) (pediatric): Secondary | ICD-10-CM

## 2016-07-21 DIAGNOSIS — I482 Chronic atrial fibrillation: Secondary | ICD-10-CM | POA: Insufficient documentation

## 2016-07-21 DIAGNOSIS — E669 Obesity, unspecified: Secondary | ICD-10-CM | POA: Diagnosis not present

## 2016-07-21 DIAGNOSIS — Z6833 Body mass index (BMI) 33.0-33.9, adult: Secondary | ICD-10-CM | POA: Diagnosis not present

## 2016-07-22 ENCOUNTER — Ambulatory Visit (INDEPENDENT_AMBULATORY_CARE_PROVIDER_SITE_OTHER): Payer: Medicare Other | Admitting: Nurse Practitioner

## 2016-07-22 ENCOUNTER — Encounter: Payer: Self-pay | Admitting: Nurse Practitioner

## 2016-07-22 VITALS — BP 130/72 | HR 80 | Ht 70.0 in | Wt 239.4 lb

## 2016-07-22 DIAGNOSIS — K648 Other hemorrhoids: Secondary | ICD-10-CM

## 2016-07-22 DIAGNOSIS — R159 Full incontinence of feces: Secondary | ICD-10-CM

## 2016-07-22 DIAGNOSIS — R194 Change in bowel habit: Secondary | ICD-10-CM

## 2016-07-22 MED ORDER — HYDROCORTISONE 2.5 % RE CREA: 1.0000 "application " | TOPICAL_CREAM | Freq: Every day | RECTAL | 1 refills | Status: DC

## 2016-07-22 NOTE — Progress Notes (Signed)
HPI:  Patient is 81 year old male known to Dr. Fuller Plan. He is referred by PCP, Dr. Edrick Oh for evaluation of diarrhea / fecal incontinence. Several months ago patient developed stool urgency and over the last few weeks has begun having problems making it to the bathroom on time. When this happens stools are generally unformed but not necessarily liquid. Stools vary between solid and liquid and when in liquid state he has leakage, especially with flatus. He does have solid stools at least 3-4 times a week. No blood in stool. No abdominal or anorectal pain. Saw PCP, who stopped artificial sweeteners and recommended daily Citrucel. Patient bought but hasn't yet started the Citrucel. No urinary incontinence. Recently started Coumadin for AFib, no other new meds or medication changes. No dietary changes   Past Medical History:  Diagnosis Date  . Allergy   . Arthritis    knees & hips  . BPH (benign prostatic hyperplasia)   . Coronary artery disease excluded   . Diverticulosis   . Endocarditis, valve unspecified, unspecified cause   . GERD (gastroesophageal reflux disease)   . H/O mitral valve repair    Postoperative ring with postoperative atrial fibrillation  . Hiatal hernia 06/13/2002  . History of kidney stones    passed spontaneously x2   . Unspecified essential hypertension   . Weakness of both arms 07/19/2012     Past Surgical History:  Procedure Laterality Date  . CARDIOVERSION N/A 04/14/2016   Procedure: CARDIOVERSION;  Surgeon: Minus Breeding, MD;  Location: [facility] ENDOSCOPY;  Service: Cardiovascular;  Laterality: N/A;  . CATARACT EXTRACTION W/PHACO Right 05/12/2015   Procedure: CATARACT EXTRACTION PHACO AND INTRAOCULAR LENS PLACEMENT RIGHT EYE CDE=7.75;  Surgeon: Tonny Branch, MD;  Location: [facility];  Service: Ophthalmology;  Laterality: Right;  . CATARACT EXTRACTION W/PHACO Left 06/12/2015   Procedure: CATARACT EXTRACTION PHACO AND INTRAOCULAR LENS PLACEMENT (IOC);  Surgeon: Tonny Branch, MD;  Location: [facility];  Service: Ophthalmology;  Laterality: Left;  CDE:  13.17  . CHOLECYSTECTOMY    . INJECTION KNEE Right 02/20/2016   Procedure: KNEE INJECTION;  Surgeon: [name], MD;  Location: [place];  Service: Orthopedics;  Laterality: Right;  . KNEE SURGERY Left   . MITRAL VALVE REPAIR    . MV repair     2003  . ROTATOR CUFF REPAIR Left   . TOTAL KNEE ARTHROPLASTY Left 02/20/2016   Procedure: LEFT TOTAL KNEE ARTHROPLASTY WITH RIGHT KNEE INJECTION;  Surgeon: [name], MD;  Location: [place];  Service: Orthopedics;  Laterality: Left;  Marland Kitchen VASECTOMY     Family History  Problem Relation Age of Onset  . Congestive Heart Failure Father   . Heart disease Father   . Stroke Brother   . Stroke Sister   . Diabetes Mellitus II Brother   . Diabetes Mellitus II Sister   . Lung cancer Brother   . Colon cancer Neg Hx   . Esophageal cancer Neg Hx   . Rectal cancer Neg Hx   . Stomach cancer Neg Hx    Social History  Substance Use Topics  . Smoking status: Former Smoker    Packs/day: 3.00    Years: 35.00    Types: Cigarettes    Quit date: 02/16/1988  . Smokeless tobacco: Never Used     Comment:  Year Quit: 1990   . Alcohol use No   Current Outpatient Prescriptions  Medication Sig Dispense Refill  . acetaminophen (TYLENOL) 500 MG tablet Take 500  mg by mouth as needed.    Marland Kitchen amLODipine (NORVASC) 2.5 MG tablet Take 2.5 mg by mouth daily before breakfast.     . lisinopril (PRINIVIL,ZESTRIL) 20 MG tablet Take 1 tablet (20 mg total) by mouth daily before breakfast. 30 tablet 5  . MULTIPLE VITAMIN PO Take 1 tablet by mouth daily.    Marland Kitchen omeprazole (PRILOSEC) 20 MG capsule Take 20 mg by mouth daily before breakfast.     . pravastatin (PRAVACHOL) 20 MG tablet TAKE 1 TABLET (20 MG TOTAL) BY MOUTH EVERY EVENING. 30 tablet 11  . vitamin C (ASCORBIC ACID) 500 MG tablet Take 500 mg by mouth daily.    Marland Kitchen warfarin (COUMADIN) 5 MG tablet Take 1 to 1.5 tablets by mouth daily or as directed by  coumadin clinic 120 tablet 0   No current facility-administered medications for this visit.    Allergies  Allergen Reactions  . Flomax [Tamsulosin Hcl] Other (See Comments)    Makes him "swimmy headed"     Review of Systems: All systems reviewed and negative except where noted in HPI.    Physical Exam: BP 130/72 (BP Location: Left Arm, Patient Position: Sitting, Cuff Size: Normal)   Pulse 80   Ht 5\' 10"  (1.778 m)   Wt 239 lb 6 oz (108.6 kg)   BMI 34.35 kg/m  Constitutional:  Well-developed, white male in no acute distress. Psychiatric: Normal mood and affect. Behavior is normal. EENT: Pupils normal.  Conjunctivae are normal. No scleral icterus. Neck supple.  Cardiovascular: Normal rate, regular rhythm. No edema Pulmonary/chest: Effort normal and breath sounds normal. No wheezing, rales or rhonchi. Abdominal: Soft, nondistended. Nontender. Bowel sounds active throughout. There are no masses palpable. No hepatomegaly. Rectal: Anal skin tag. Decreased sphincter tone. Squeeze pressure seems diminished. On anoscopy hemorrhoids were seen. Lymphadenopathy: No cervical adenopathy noted. Neurological: Alert and oriented to person place and time. Skin: Skin is warm and dry. No rashes noted.   ASSESSMENT AND PLAN:  81 year old male with bowel habit changes. Several month history of urgency with recent development of intermittent urge incontinence and seepage of unformed stools. He does have normal solid stools 3-4 times a week and doesn't feel he has overflow diarrhea from underlying constipation. On exam he does have decreased sphincter tone -PCP recently stopped artificial sweeteners and patient hasn't had any bowel problems in last few days. Daily Citrucel recommended but he hasn't yet started it.     -It is harder to retain loose stool, especially with decreased sphincter tone and this can explain the incontinence and seepage. We can refer him for anal manometry / treatment but  firming up stools would be easier  -Wait and see how durable his response is to stopping the artifical sweeteners. Hopefully no further evaluation will be needed.  -Recommend he start the citrucel as recommended by PCP  -Call us back it symptoms do not continue to improve.  Internal hemorrhoids.  -Anusol cream inside rectal x 7 days.   Colon cancer screening. Patient is up-to-date on screening colonoscopy, last one was June 2014 with findings of small internal hemorrhoids , mild diverticulosis, and a tiny hyperplastic sessile sigmoid colon polyp.    Tye Savoy, NP  07/22/2016, 1:38 PM  Cc: Dione Housekeeper, MD

## 2016-07-22 NOTE — Patient Instructions (Signed)
If you are age 79 or older, your body mass index should be between 23-30. Your Body mass index is 34.35 kg/m. If this is out of the aforementioned range listed, please consider follow up with your Primary Care Provider.  If you are age 32 or younger, your body mass index should be between 19-25. Your Body mass index is 34.35 kg/m. If this is out of the aformentioned range listed, please consider follow up with your Primary Care Provider.   We have sent the following medications to your pharmacy for you to pick up at your convenience: Anusol HC cream  Start Citrucel daily recommended by PCP.  Continue to avoid artificial sweeteners.  Call if not continuing to improve.  Thank you for choosing me and [facility].   Tye Savoy, NP

## 2016-07-25 NOTE — Progress Notes (Signed)
Reviewed and agree with initial management plan.  Josephus Harriger T. Gwendola Hornaday, MD FACG 

## 2016-07-27 ENCOUNTER — Ambulatory Visit (INDEPENDENT_AMBULATORY_CARE_PROVIDER_SITE_OTHER): Payer: Medicare Other | Admitting: *Deleted

## 2016-07-27 DIAGNOSIS — I4819 Other persistent atrial fibrillation: Secondary | ICD-10-CM

## 2016-07-27 DIAGNOSIS — I481 Persistent atrial fibrillation: Secondary | ICD-10-CM | POA: Diagnosis not present

## 2016-07-27 DIAGNOSIS — Z5181 Encounter for therapeutic drug level monitoring: Secondary | ICD-10-CM

## 2016-07-27 DIAGNOSIS — Z7901 Long term (current) use of anticoagulants: Secondary | ICD-10-CM | POA: Diagnosis not present

## 2016-07-27 LAB — POCT INR: INR: 3.4

## 2016-08-01 NOTE — Progress Notes (Signed)
HPI  The patient presents for evaluation of mitral valve repair.  Echo 10/2015 demonstrated stable MV repair.  While in the hospital earlier this year for knee surgery and he developed persistent atrial fib.  He is now status post DCCV.  Unfortunately when I saw him recently he was back in atrial fibrillation. He wore a Holter monitor which demonstrated frequent 3.5 second pauses at night. His average heart rate was about 80.  He returns for follow up.  He has had no new complaints.  He did have his sleep study and he had severe apnea.  He is to be set up for CPAP at home now.   The patient denies any new symptoms such as chest discomfort, neck or arm discomfort. There has been no new shortness of breath, PND or orthopnea. There have been no reported palpitations, presyncope or syncope.      Allergies  Allergen Reactions  . Flomax [Tamsulosin Hcl] Other (See Comments)    Makes him "swimmy headed"    Current Outpatient Prescriptions  Medication Sig Dispense Refill  . acetaminophen (TYLENOL) 500 MG tablet Take 500 mg by mouth as needed.    Marland Kitchen amLODipine (NORVASC) 2.5 MG tablet Take 2.5 mg by mouth daily before breakfast.     . hydrocortisone (ANUSOL-HC) 2.5 % rectal cream Place 1 application rectally at bedtime. For 7 days. 30 g 1  . lisinopril (PRINIVIL,ZESTRIL) 20 MG tablet Take 1 tablet (20 mg total) by mouth daily before breakfast. 30 tablet 5  . MULTIPLE VITAMIN PO Take 1 tablet by mouth daily.    Marland Kitchen omeprazole (PRILOSEC) 20 MG capsule Take 20 mg by mouth daily before breakfast.     . pravastatin (PRAVACHOL) 20 MG tablet TAKE 1 TABLET (20 MG TOTAL) BY MOUTH EVERY EVENING. 30 tablet 11  . vitamin C (ASCORBIC ACID) 500 MG tablet Take 500 mg by mouth daily.    Marland Kitchen warfarin (COUMADIN) 5 MG tablet Take 1 to 1.5 tablets by mouth daily or as directed by coumadin clinic 120 tablet 0   No current facility-administered medications for this visit.     Past Medical History:  Diagnosis Date  .  Allergy   . Arthritis    knees & hips  . BPH (benign prostatic hyperplasia)   . Coronary artery disease excluded   . Diverticulosis   . Endocarditis, valve unspecified, unspecified cause   . GERD (gastroesophageal reflux disease)   . H/O mitral valve repair    Postoperative ring with postoperative atrial fibrillation  . Hiatal hernia 06/13/2002  . History of kidney stones    passed spontaneously x2   . Unspecified essential hypertension   . Weakness of both arms 07/19/2012    Past Surgical History:  Procedure Laterality Date  . CARDIOVERSION N/A 04/14/2016   Procedure: CARDIOVERSION;  Surgeon: Minus Breeding, MD;  Location: [facility] ENDOSCOPY;  Service: Cardiovascular;  Laterality: N/A;  . CATARACT EXTRACTION W/PHACO Right 05/12/2015   Procedure: CATARACT EXTRACTION PHACO AND INTRAOCULAR LENS PLACEMENT RIGHT EYE CDE=7.75;  Surgeon: Tonny Branch, MD;  Location: [facility];  Service: Ophthalmology;  Laterality: Right;  . CATARACT EXTRACTION W/PHACO Left 06/12/2015   Procedure: CATARACT EXTRACTION PHACO AND INTRAOCULAR LENS PLACEMENT (IOC);  Surgeon: Tonny Branch, MD;  Location: [facility];  Service: Ophthalmology;  Laterality: Left;  CDE:  13.17  . CHOLECYSTECTOMY    . INJECTION KNEE Right 02/20/2016   Procedure: KNEE INJECTION;  Surgeon: [name], MD;  Location: [place];  Service: Orthopedics;  Laterality: Right;  . KNEE SURGERY Left   . MITRAL VALVE REPAIR    . MV repair     2003  . ROTATOR CUFF REPAIR Left   . TOTAL KNEE ARTHROPLASTY Left 02/20/2016   Procedure: LEFT TOTAL KNEE ARTHROPLASTY WITH RIGHT KNEE INJECTION;  Surgeon: [name], MD;  Location: [place];  Service: Orthopedics;  Laterality: Left;  Marland Kitchen VASECTOMY      ROS:   As stated in the HPI and negative for all other systems.  PHYSICAL EXAM BP 118/72 (BP Location: Left Arm, Patient Position: Sitting, Cuff Size: Large)   Pulse (!) 59   Ht 5\' 10"  (1.778 m)   Wt 238 lb 6.4 oz (108.1 kg)   BMI 34.21 kg/m   GENERAL:  Well appearing NECK:   No jugular venous distention, waveform within normal limits, carotid upstroke brisk and symmetric, no bruits, no thyromegaly LUNGS:  Clear to auscultation bilaterally CHEST:  Unremarkable HEART:  PMI not displaced or sustained,S1 and S2 within normal limits, no S3, no clicks, no rubs, no murmurs, irregular ABD:  Flat, positive bowel sounds normal in frequency in pitch, no bruits, no rebound, no guarding, no midline pulsatile mass, no hepatomegaly, no splenomegaly EXT:  2 plus pulses throughout, no edema, no cyanosis no clubbing   EKG:  Atrial fibrillation, rate 59, axis within normal limits, intervals within normal limits, low voltage in the limb leads.  08/02/2016  ASSESSMENT AND PLAN  ATRIAL FIB:    [name] has a CHA2DS2 - VASc of 5. He has bradycardia possibly related to sleep apnea.  At this point he has no symptoms related to the slow heart rhythm and he will continue the meds as listed.  He will let me know if her every has any presyncope or light headedness.   We walked him around the office today and the heart rate went up appropriately.    SLEEP APNEA:   He has severe sleep apnea.  I reviewed the preliminary report with Dr. Claiborne Billings today.  The patient will now be set up with CPAP.  He will have follow up with Dr. Claiborne Billings.    MITRAL VALVE REPAIR:    He had a stable MV repair in Sept.  No further imaging at this point.    CAROTID STENOSIS:   This was mild.  No further imaging is planned at this point.   HTN:  The blood pressure is at target. No change in medications is indicated. We will continue with therapeutic lifestyle changes (TLC).  OBESITY:   The patient understands the need to lose weight with diet and exercise. We have discussed specific strategies for this.

## 2016-08-02 ENCOUNTER — Encounter: Payer: Self-pay | Admitting: Cardiology

## 2016-08-02 ENCOUNTER — Ambulatory Visit (INDEPENDENT_AMBULATORY_CARE_PROVIDER_SITE_OTHER): Payer: Medicare Other | Admitting: Cardiology

## 2016-08-02 VITALS — BP 118/72 | HR 59 | Ht 70.0 in | Wt 238.4 lb

## 2016-08-02 DIAGNOSIS — Z9889 Other specified postprocedural states: Secondary | ICD-10-CM | POA: Diagnosis not present

## 2016-08-02 DIAGNOSIS — I10 Essential (primary) hypertension: Secondary | ICD-10-CM | POA: Diagnosis not present

## 2016-08-02 DIAGNOSIS — I482 Chronic atrial fibrillation: Secondary | ICD-10-CM | POA: Diagnosis not present

## 2016-08-02 DIAGNOSIS — G4730 Sleep apnea, unspecified: Secondary | ICD-10-CM

## 2016-08-02 DIAGNOSIS — I4821 Permanent atrial fibrillation: Secondary | ICD-10-CM

## 2016-08-02 NOTE — Patient Instructions (Addendum)

## 2016-08-05 NOTE — Procedures (Signed)
Patient Name: [name], Forget Date: 07/21/2016 Gender: Male D.O.B: Dec 14, 1935 Age (years): 80 Referring Provider: Minus Breeding Height (inches): 71 Interpreting Physician: Shelva Majestic MD, ABSM Weight (lbs): 235 RPSGT: Laren Everts BMI: 33 MRN: [id number] Neck Size: 18.00  CLINICAL INFORMATION Sleep Study Type: Split Night CPAP  Indication for sleep study: Excessive Daytime Sleepiness, Fatigue, Hypertension, Obesity, Snoring, Witnessed Apneas  Epworth Sleepiness Score: 12  SLEEP STUDY TECHNIQUE As per the AASM Manual for the Scoring of Sleep and Associated Events v2.3 (April 2016) with a hypopnea requiring 4% desaturations.  The channels recorded and monitored were frontal, central and occipital EEG, electrooculogram (EOG), submentalis EMG (chin), nasal and oral airflow, thoracic and abdominal wall motion, anterior tibialis EMG, snore microphone, electrocardiogram, and pulse oximetry. Continuous positive airway pressure (CPAP) was initiated when the patient met split night criteria and was titrated according to treat sleep-disordered breathing.  MEDICATIONS acetaminophen (TYLENOL) 500 MG tablet amLODipine (NORVASC) 2.5 MG tablet hydrocortisone (ANUSOL-HC) 2.5 % rectal cream lisinopril (PRINIVIL,ZESTRIL) 20 MG tablet MULTIPLE VITAMIN PO omeprazole (PRILOSEC) 20 MG capsule pravastatin (PRAVACHOL) 20 MG tablet vitamin C (ASCORBIC ACID) 500 MG tablet warfarin (COUMADIN) 5 MG tablet  Medications self-administered by patient taken the night of the study : N/A  RESPIRATORY PARAMETERS Diagnostic  Total AHI (/hr): 55.4 RDI (/hr): 57.5 OA Index (/hr): 15 CA Index (/hr): 2.5 REM AHI (/hr): 57.5 NREM AHI (/hr): 55.0 Supine AHI (/hr): 76.1 Non-supine AHI (/hr): 45.54 Min O2 Sat (%): 72.00 Mean O2 (%): 90.69 Time below 88% (min): 31.1   Titration  Optimal Pressure (cm): 14 AHI at Optimal Pressure (/hr): 0.0 Min O2 at Optimal Pressure (%): 90.0 Supine % at Optimal  (%): 100 Sleep % at Optimal (%): 100    SLEEP ARCHITECTURE The recording time for the entire night was 485.3 minutes.  During a baseline period of 261.0 minutes, the patient slept for 144.0 minutes in REM and nonREM, yielding a sleep efficiency of 55.2%. Sleep onset after lights out was 45.7 minutes with a REM latency of 189.0 minutes. The patient spent 33.33% of the night in stage N1 sleep, 50.00% in stage N2 sleep, 0.00% in stage N3 and 16.67% in REM.  During the titration period of 217.4 minutes, the patient slept for 134.5 minutes in REM and nonREM, yielding a sleep efficiency of 61.9%. Sleep onset after CPAP initiation was 35.8 minutes with a REM latency of 75.5 minutes. The patient spent 11.15% of the night in stage N1 sleep, 67.29% in stage N2 sleep, 0.00% in stage N3 and 21.56% in REM.  CARDIAC DATA The 2 lead EKG demonstrated atrial fibrillation. The mean heart rate was 56.14 beats per minute. Other EKG findings include: PVCs.  LEG MOVEMENT DATA The total Periodic Limb Movements of Sleep (PLMS) were 0. The PLMS index was 0.00 .  IMPRESSIONS - Severe obstructive sleep apnea occurred during the diagnostic portion of the study (AHI 55.4/hour). AHI during REM sleep 57.5/h.  CPAP was implemented at was titrated to an optimal PAP pressure at 14 cm of water. - No significant central sleep apnea occurred during the diagnostic portion of the study (CAI = 2.5/hour). - Significant oxygen desaturation to a nadir of  80% with NREM and 72% during REM sleep. - The patient snored with Loud snoring volume during the diagnostic portion of the study. - EKG findings include atrial fibrillation with  PVCs. - Clinically significant periodic limb movements did not occur during sleep.  DIAGNOSIS - Obstructive Sleep Apnea (327.23 [G47.33 ICD-10])  RECOMMENDATIONS - Recommend an initial trial of CPAP therapy on 14 cm H2O with heated humidification. A Medium size Resmed Full Face Mask AirFit F20 mask was  used for the titration. - Efforts should be made to optimize nasal and oropharyngeal patency. - Avoid alcohol, sedatives and other CNS depressants that may worsen sleep apnea and disrupt normal sleep architecture. - Sleep hygiene should be reviewed to assess factors that may improve sleep quality. - Weight management (BMI 33) and regular exercise should be initiated or continued. - Recommend a download be obtained in 30 days and  sleep clinic evaluation after 4 weeks of therapy  [Electronically signed] 08/05/2016 05:45 PM  Shelva Majestic MD, [facility], ABSM Diplomate, American Board of Sleep Medicine   NPI: [id number] [place] PH: [phone]   FX: [phone] [place]

## 2016-08-11 ENCOUNTER — Telehealth: Payer: Self-pay | Admitting: *Deleted

## 2016-08-11 NOTE — Telephone Encounter (Signed)
Patient and wife notified( via speaker phone) of sleep study results and recommendations. They voiced understanding. Given the chance to ask questions. They had no questions. Referral sent to [facility] for set up.

## 2016-08-24 ENCOUNTER — Ambulatory Visit (INDEPENDENT_AMBULATORY_CARE_PROVIDER_SITE_OTHER): Payer: Medicare Other | Admitting: *Deleted

## 2016-08-24 DIAGNOSIS — Z7901 Long term (current) use of anticoagulants: Secondary | ICD-10-CM | POA: Diagnosis not present

## 2016-08-24 DIAGNOSIS — Z5181 Encounter for therapeutic drug level monitoring: Secondary | ICD-10-CM | POA: Diagnosis not present

## 2016-08-24 DIAGNOSIS — I4819 Other persistent atrial fibrillation: Secondary | ICD-10-CM

## 2016-08-24 DIAGNOSIS — I481 Persistent atrial fibrillation: Secondary | ICD-10-CM | POA: Diagnosis not present

## 2016-08-24 LAB — POCT INR: INR: 4.2

## 2016-09-07 ENCOUNTER — Ambulatory Visit (INDEPENDENT_AMBULATORY_CARE_PROVIDER_SITE_OTHER): Payer: Medicare Other | Admitting: *Deleted

## 2016-09-07 DIAGNOSIS — Z5181 Encounter for therapeutic drug level monitoring: Secondary | ICD-10-CM

## 2016-09-07 DIAGNOSIS — I4891 Unspecified atrial fibrillation: Secondary | ICD-10-CM | POA: Diagnosis not present

## 2016-09-07 DIAGNOSIS — Z7901 Long term (current) use of anticoagulants: Secondary | ICD-10-CM | POA: Diagnosis not present

## 2016-09-07 DIAGNOSIS — I481 Persistent atrial fibrillation: Secondary | ICD-10-CM

## 2016-09-07 DIAGNOSIS — I4819 Other persistent atrial fibrillation: Secondary | ICD-10-CM

## 2016-09-07 LAB — POCT INR: INR: 2.6

## 2016-09-17 ENCOUNTER — Other Ambulatory Visit: Payer: Self-pay | Admitting: Cardiology

## 2016-09-28 ENCOUNTER — Ambulatory Visit (INDEPENDENT_AMBULATORY_CARE_PROVIDER_SITE_OTHER): Payer: Medicare Other | Admitting: *Deleted

## 2016-09-28 DIAGNOSIS — Z5181 Encounter for therapeutic drug level monitoring: Secondary | ICD-10-CM

## 2016-09-28 DIAGNOSIS — I481 Persistent atrial fibrillation: Secondary | ICD-10-CM

## 2016-09-28 DIAGNOSIS — I4819 Other persistent atrial fibrillation: Secondary | ICD-10-CM

## 2016-09-28 DIAGNOSIS — Z7901 Long term (current) use of anticoagulants: Secondary | ICD-10-CM | POA: Diagnosis not present

## 2016-09-28 LAB — POCT INR: INR: 3.2

## 2016-10-11 ENCOUNTER — Other Ambulatory Visit: Payer: Self-pay | Admitting: Physician Assistant

## 2016-10-11 ENCOUNTER — Other Ambulatory Visit: Payer: Self-pay | Admitting: Cardiology

## 2016-10-11 NOTE — Telephone Encounter (Signed)
Rx request sent to pharmacy.  

## 2016-10-19 ENCOUNTER — Ambulatory Visit (INDEPENDENT_AMBULATORY_CARE_PROVIDER_SITE_OTHER): Payer: Medicare Other | Admitting: *Deleted

## 2016-10-19 DIAGNOSIS — I481 Persistent atrial fibrillation: Secondary | ICD-10-CM

## 2016-10-19 DIAGNOSIS — Z7901 Long term (current) use of anticoagulants: Secondary | ICD-10-CM

## 2016-10-19 DIAGNOSIS — I4819 Other persistent atrial fibrillation: Secondary | ICD-10-CM

## 2016-10-19 DIAGNOSIS — Z5181 Encounter for therapeutic drug level monitoring: Secondary | ICD-10-CM | POA: Diagnosis not present

## 2016-10-19 LAB — POCT INR: INR: 2.9

## 2016-11-11 ENCOUNTER — Ambulatory Visit (INDEPENDENT_AMBULATORY_CARE_PROVIDER_SITE_OTHER): Payer: Medicare Other | Admitting: Cardiovascular Disease

## 2016-11-11 ENCOUNTER — Encounter: Payer: Self-pay | Admitting: Cardiovascular Disease

## 2016-11-11 VITALS — BP 142/85 | HR 63 | Ht 71.0 in | Wt 240.8 lb

## 2016-11-11 DIAGNOSIS — I10 Essential (primary) hypertension: Secondary | ICD-10-CM | POA: Diagnosis not present

## 2016-11-11 DIAGNOSIS — G4733 Obstructive sleep apnea (adult) (pediatric): Secondary | ICD-10-CM

## 2016-11-11 DIAGNOSIS — I481 Persistent atrial fibrillation: Secondary | ICD-10-CM | POA: Diagnosis not present

## 2016-11-11 DIAGNOSIS — I4819 Other persistent atrial fibrillation: Secondary | ICD-10-CM

## 2016-11-11 DIAGNOSIS — Z9889 Other specified postprocedural states: Secondary | ICD-10-CM

## 2016-11-11 NOTE — Patient Instructions (Signed)
Medication Instructions:  Your physician recommends that you continue on your current medications as directed. Please refer to the Current Medication list given to you today.  Follow-Up: Your physician wants you to follow-up in: 32 MONTHS with Dr. Claiborne Billings (sleep clinic). You will receive a reminder letter in the mail two months in advance. If you don't receive a letter, please call our office to schedule the follow-up appointment.   Any Other Special Instructions Will Be Listed Below (If Applicable).     If you need a refill on your cardiac medications before your next appointment, please call your pharmacy.

## 2016-11-11 NOTE — Progress Notes (Signed)
Cardiology Office Note    Date:  11/13/2016   ID:  [name], DOB January 19, 1936, MRN [id number]  PCP:  [name] Housekeeper, MD  Cardiologist:  Shelva Majestic, MD (sleep); Dr. Percival Spanish  New sleep clinic evaluation  History of Present Illness:  [name] is a 81 y.o. male who presents for a new sleep clinic evaluation following initiation of CPAP therapy for sleep apnea.  [name] is followed by Dr. Percival Spanish for primary cardiology care.  He has a history of mitral valve repair as well as atrial fibrillation.  He had undergone DC cardioversion, but subsequently, he developed recurrent AF.  He was referred for sleep study.  Due to concerns for sleep apnea.  He was found to have severe obstructive sleep apnea with an HI of 55.4 per hour.  AHI during rim sleep was 57.5 per hour.  He had significant oxygen desaturation to 80% with non-REM and 72%.  During rems sleep.  There was loud snoring.  CPAP was implemented and was titrated to an optimal pressure of 14 cm.  His CPAP set up date was 08/23/2016 and he has a ResMed air since 10 AutoSet unit.  He has been using a ResMed airFit F 20 medium size mask.  A download was obtained from August 26 2 11/08/2016.  His compliance is excellent at 100%.  He is averaging 8 hours and 39 minutes per night of sleep.  At a set pressure of 14 cm, however, AHI is still mildly elevated at 6.5, with an apnea index of 5.6.  He has noticed a significant improvement since CPAP initiation.  Previously he had experienced loud snoring and nocturia at least 3-4 times per night.  He now is unaware of any snoring.  Most nights he can sleep without urination but at times he wakes up 1 time per night.  He feels more refreshed.  His sleep is restorative.  He denies any daytime sleepiness,  bruxism, restless legs, hypnogognic hallucinations, or cataplexy.  An Epworth Sleepiness Scale score was calculated in the office today and this endorsed at 8 shown below.  Epworth Sleepiness  Scale: Situation   Chance of Dozing/Sleeping (0 = never , 1 = slight chance , 2 = moderate chance , 3 = high chance )   sitting and reading 1   watching TV 3   sitting inactive in a public place 0   being a passenger in a motor vehicle for an hour or more 0   lying down in the afternoon 2   sitting and talking to someone 0   sitting quietly after lunch (no alcohol) 2   while stopped for a few minutes in traffic as the driver 0   Total Score  8   He presents for initial sleep evaluation.  Past Medical History:  Diagnosis Date  . Allergy   . Arthritis    knees & hips  . BPH (benign prostatic hyperplasia)   . Coronary artery disease excluded   . Diverticulosis   . Endocarditis, valve unspecified, unspecified cause   . GERD (gastroesophageal reflux disease)   . H/O mitral valve repair    Postoperative ring with postoperative atrial fibrillation  . Hiatal hernia 06/13/2002  . History of kidney stones    passed spontaneously x2   . Unspecified essential hypertension   . Weakness of both arms 07/19/2012    Past Surgical History:  Procedure Laterality Date  . CARDIOVERSION N/A 04/14/2016   Procedure: CARDIOVERSION;  Surgeon: Jeneen Rinks  Hochrein, MD;  Location: [place];  Service: Cardiovascular;  Laterality: N/A;  . CATARACT EXTRACTION W/PHACO Right 05/12/2015   Procedure: CATARACT EXTRACTION PHACO AND INTRAOCULAR LENS PLACEMENT RIGHT EYE CDE=7.75;  Surgeon: Tonny Branch, MD;  Location: [facility];  Service: Ophthalmology;  Laterality: Right;  . CATARACT EXTRACTION W/PHACO Left 06/12/2015   Procedure: CATARACT EXTRACTION PHACO AND INTRAOCULAR LENS PLACEMENT (IOC);  Surgeon: Tonny Branch, MD;  Location: [facility];  Service: Ophthalmology;  Laterality: Left;  CDE:  13.17  . CHOLECYSTECTOMY    . INJECTION KNEE Right 02/20/2016   Procedure: KNEE INJECTION;  Surgeon: [name], MD;  Location: [place];  Service: Orthopedics;  Laterality: Right;  . KNEE SURGERY Left   . MITRAL VALVE REPAIR    . MV  repair     2003  . ROTATOR CUFF REPAIR Left   . TOTAL KNEE ARTHROPLASTY Left 02/20/2016   Procedure: LEFT TOTAL KNEE ARTHROPLASTY WITH RIGHT KNEE INJECTION;  Surgeon: [name], MD;  Location: [facility];  Service: Orthopedics;  Laterality: Left;  Marland Kitchen VASECTOMY      Current Medications: Outpatient Medications Prior to Visit  Medication Sig Dispense Refill  . acetaminophen (TYLENOL) 500 MG tablet Take 500 mg by mouth as needed.    Marland Kitchen amLODipine (NORVASC) 2.5 MG tablet Take 2.5 mg by mouth daily before breakfast.     . lisinopril (PRINIVIL,ZESTRIL) 20 MG tablet TAKE 1 TABLET (20 MG TOTAL) BY MOUTH DAILY BEFORE BREAKFAST. 30 tablet 5  . MULTIPLE VITAMIN PO Take 1 tablet by mouth daily.    Marland Kitchen omeprazole (PRILOSEC) 20 MG capsule Take 20 mg by mouth daily before breakfast.     . pravastatin (PRAVACHOL) 20 MG tablet TAKE 1 TABLET (20 MG TOTAL) BY MOUTH EVERY EVENING. 30 tablet 4  . vitamin C (ASCORBIC ACID) 500 MG tablet Take 500 mg by mouth daily.    Marland Kitchen warfarin (COUMADIN) 5 MG tablet Take 1 tablet by mouth daily or as directed by coumadin clinic 90 tablet 0  . hydrocortisone (ANUSOL-HC) 2.5 % rectal cream Place 1 application rectally at bedtime. For 7 days. 30 g 1   No facility-administered medications prior to visit.      Allergies:   Flomax [tamsulosin hcl]   Social History   Social History  . Marital status: Married    Spouse name: GLORIA  . Number of children: 2  . Years of education: N/A   Occupational History  .  Machine Music therapist     RETIRED   Social History Main Topics  . Smoking status: Former Smoker    Packs/day: 3.00    Years: 35.00    Types: Cigarettes    Quit date: 02/16/1988  . Smokeless tobacco: Never Used     Comment:  Year Quit: 1990   . Alcohol use No  . Drug use: No  . Sexual activity: Not Asked   Other Topics Concern  . None   Social History Narrative  . None     Family History:  The patient's family history includes Congestive Heart Failure in his  father; Diabetes Mellitus II in his brother and sister; Heart disease in his father; Lung cancer in his brother; Stroke in his brother and sister.   ROS General: Negative; No fevers, chills, or night sweats;  HEENT: Negative; No changes in vision or hearing, sinus congestion, difficulty swallowing Pulmonary: Negative; No cough, wheezing, shortness of breath, hemoptysis Cardiovascular: Status post mitral valve repair.  Atrial fibrillation GI: Negative; No nausea,  vomiting, diarrhea, or abdominal pain GU: Negative; No dysuria, hematuria, or difficulty voiding Musculoskeletal: Negative; no myalgias, joint pain, or weakness Hematologic/Oncology: Negative; no easy bruising, bleeding Endocrine: Negative; no heat/cold intolerance; no diabetes Neuro: Negative; no changes in balance, headaches Skin: Negative; No rashes or skin lesions Psychiatric: Negative; No behavioral problems, depression Sleep: See history of present illness  Other comprehensive 14 point system review is negative.   PHYSICAL EXAM:   VS:  BP (!) 142/85   Pulse 63   Ht _0  (1.803 m)   Wt 240 lb 12.8 oz (109.2 kg)   BMI 33.58 kg/m    Wt Readings from Last 3 Encounters:  11/11/16 240 lb 12.8 oz (109.2 kg)  08/02/16 238 lb 6.4 oz (108.1 kg)  07/22/16 239 lb 6 oz (108.6 kg)    General: Alert, oriented, no distress.  Skin: normal turgor, no rashes, warm and dry HEENT: Normocephalic, atraumatic. Pupils equal round and reactive to light; sclera anicteric; extraocular muscles intact;  He has bilateral lens implants.  Disks flat. Nose without nasal septal hypertrophy Mouth/Parynx benign; Mallinpatti scale 3 Neck: No JVD, no carotid bruits; normal carotid upstroke Lungs: clear to ausculatation and percussion; no wheezing or rales Chest wall: without tenderness to palpitation Heart: PMI not displaced, RRR, s1 s2 normal, 1/6 systolic murmur, no diastolic murmur, no rubs, gallops, thrills, or heaves Abdomen: soft, nontender;  no hepatosplenomehaly, BS+; abdominal aorta nontender and not dilated by palpation. Back: no CVA tenderness Pulses 2+ Musculoskeletal: full range of motion, normal strength, no joint deformities Extremities: no clubbing cyanosis or edema, Homan's sign negative  Neurologic: grossly nonfocal; Cranial nerves grossly wnl Psychologic: Normal mood and affect   Studies/Labs Reviewed:   EKG:  EKG is ordered today.  The ekg ordered today demonstrates Atrial fibrillation at 63 bpm.  QTc interval 407 msec  Recent Labs: BMP Latest Ref Rng & Units 04/08/2016 02/23/2016 02/22/2016  Glucose 65 - 99 mg/dL 107(H) 207(H) 145(H)  BUN 8 - 27 mg/dL 15 17 22(H)  Creatinine 0.76 - 1.27 mg/dL 0.70(L) 0.80 0.88  BUN/Creat Ratio 10 - 24 21 - -  Sodium 134 - 144 mmol/L 141 135 134(L)  Potassium 3.5 - 5.2 mmol/L 4.2 3.6 3.8  Chloride 96 - 106 mmol/L 97 98(L) 96(L)  CO2 18 - 29 mmol/L 25 29 32  Calcium 8.6 - 10.2 mg/dL 9.3 8.4(L) 8.4(L)     Hepatic Function Latest Ref Rng & Units 02/11/2016 06/25/2011  Total Protein 6.5 - 8.1 g/dL 7.4 7.0  Albumin 3.5 - 5.0 g/dL 4.5 4.0  AST 15 - 41 U/L 19 16  ALT 17 - 63 U/L 15(L) 13  Alk Phosphatase 38 - 126 U/L 71 84  Total Bilirubin 0.3 - 1.2 mg/dL 1.1 0.3    CBC Latest Ref Rng & Units 04/08/2016 02/23/2016 02/22/2016  WBC 3.4 - 10.8 x10E3/uL 9.5 10.5 13.4(H)  Hemoglobin 13.0 - 17.7 g/dL 15.2 12.5(L) 12.4(L)  Hematocrit 37.5 - 51.0 % 45.0 36.0(L) 36.1(L)  Platelets 150 - 379 x10E3/uL 309 196 167   Lab Results  Component Value Date   MCV 93 04/08/2016   MCV 92.8 02/23/2016   MCV 94.0 02/22/2016   Lab Results  Component Value Date   TSH 2.350 04/08/2016   No results found for: HGBA1C   BNP No results found for: BNP  ProBNP No results found for: PROBNP   Lipid Panel  No results found for: CHOL, TRIG, HDL, CHOLHDL, VLDL, LDLCALC, LDLDIRECT   RADIOLOGY: No results found.  Additional studies/ records that were reviewed today include:  I reviewed the  records from Dr. Percival Spanish.  I reviewed his sleep study and obtain a download to assess efficacy and compliance.    ASSESSMENT:    1. Obstructive sleep apnea syndrome   2. Persistent atrial fibrillation ([place])   3. H/O mitral valve repair   4. Essential hypertension      PLAN:  Mr. Grau is a very pleasant 81 year old gentleman who has cardiovascular comorbidities including mitral valve repair, atrial fibrillation, hypertension, and mild carotid plaque.  He had undergone cardioversion for atrial fibrillation, but unfortunately develop recurrent atrial fibrillation.  His ECG today confirms he is still in atrial fibrillation.  He was found to have severe obstructive sleep apnea on the baseline portion of his split-night sleep evaluation.  In oxygen desaturation to a nadir of 72%.  During grams sleep.  He is now been on CPAP therapy.  His download demonstrates excellent compliance.  However, despite his set pressure of 14 cm AHI is still elevated.  I have recommended changing him to an auto mode.  I will set him at 8.  Minimum pressure of 10 with up to a maximum pressure of 20.  This will be able to accommodate additional pressure requirements and hopefully completely resolve any further apneic or hypopnea spells.  Clinically, he feels significantly improved.  He has more energy.  His sleep is restorative.  His nocturia has resolved. In the future, ever undergoes repeat cardioversion and he continues to use CPAP hopefully the recurrence of atrial fibrillation will be significantly less since data suggests almost a doubling of recurrent AF in patients with untreated sleep apnea.  I had a long discussion with both he and his family regarding the pathophysiology associated with sleep apnea induced nocturia as well as the potential adverse cardiovascular effects of untreated sleep apnea.  Per Medicare requirements I will see him in one year for reevaluation.  He will return to Dr. Percival Spanish for his primary  cardiology care.     Medication Adjustments/Labs and Tests Ordered: Current medicines are reviewed at length with the patient today.  Concerns regarding medicines are outlined above.  Medication changes, Labs and Tests ordered today are listed in the Patient Instructions below. Patient Instructions  Medication Instructions:  Your physician recommends that you continue on your current medications as directed. Please refer to the Current Medication list given to you today.  Follow-Up: Your physician wants you to follow-up in: 59 MONTHS with Dr. Claiborne Billings (sleep clinic). You will receive a reminder letter in the mail two months in advance. If you don't receive a letter, please call our office to schedule the follow-up appointment.   Any Other Special Instructions Will Be Listed Below (If Applicable).     If you need a refill on your cardiac medications before your next appointment, please call your pharmacy.      Signed, Shelva Majestic, MD  11/13/2016 9:45 AM    [facility] [address], [place], [place], [place]  [address] Phone: [phone]

## 2016-11-16 ENCOUNTER — Ambulatory Visit (INDEPENDENT_AMBULATORY_CARE_PROVIDER_SITE_OTHER): Payer: Medicare Other | Admitting: *Deleted

## 2016-11-16 DIAGNOSIS — Z7901 Long term (current) use of anticoagulants: Secondary | ICD-10-CM | POA: Diagnosis not present

## 2016-11-16 DIAGNOSIS — I481 Persistent atrial fibrillation: Secondary | ICD-10-CM | POA: Diagnosis not present

## 2016-11-16 DIAGNOSIS — I4819 Other persistent atrial fibrillation: Secondary | ICD-10-CM

## 2016-11-16 DIAGNOSIS — Z5181 Encounter for therapeutic drug level monitoring: Secondary | ICD-10-CM

## 2016-11-16 DIAGNOSIS — I4891 Unspecified atrial fibrillation: Secondary | ICD-10-CM

## 2016-11-16 LAB — POCT INR: INR: 3.6

## 2016-12-02 ENCOUNTER — Ambulatory Visit (INDEPENDENT_AMBULATORY_CARE_PROVIDER_SITE_OTHER): Payer: Medicare Other | Admitting: *Deleted

## 2016-12-02 DIAGNOSIS — Z5181 Encounter for therapeutic drug level monitoring: Secondary | ICD-10-CM

## 2016-12-02 DIAGNOSIS — Z7901 Long term (current) use of anticoagulants: Secondary | ICD-10-CM

## 2016-12-02 DIAGNOSIS — I481 Persistent atrial fibrillation: Secondary | ICD-10-CM

## 2016-12-02 DIAGNOSIS — I4891 Unspecified atrial fibrillation: Secondary | ICD-10-CM | POA: Diagnosis not present

## 2016-12-02 DIAGNOSIS — I4819 Other persistent atrial fibrillation: Secondary | ICD-10-CM

## 2016-12-02 LAB — POCT INR: INR: 2.4

## 2016-12-23 ENCOUNTER — Ambulatory Visit (INDEPENDENT_AMBULATORY_CARE_PROVIDER_SITE_OTHER): Payer: Medicare Other | Admitting: *Deleted

## 2016-12-23 DIAGNOSIS — Z5181 Encounter for therapeutic drug level monitoring: Secondary | ICD-10-CM | POA: Diagnosis not present

## 2016-12-23 DIAGNOSIS — I481 Persistent atrial fibrillation: Secondary | ICD-10-CM

## 2016-12-23 DIAGNOSIS — Z7901 Long term (current) use of anticoagulants: Secondary | ICD-10-CM

## 2016-12-23 DIAGNOSIS — I4819 Other persistent atrial fibrillation: Secondary | ICD-10-CM

## 2016-12-23 LAB — POCT INR: INR: 2.1

## 2017-01-06 ENCOUNTER — Other Ambulatory Visit: Payer: Self-pay | Admitting: Cardiology

## 2017-01-20 ENCOUNTER — Ambulatory Visit (INDEPENDENT_AMBULATORY_CARE_PROVIDER_SITE_OTHER): Payer: Medicare Other | Admitting: *Deleted

## 2017-01-20 DIAGNOSIS — I481 Persistent atrial fibrillation: Secondary | ICD-10-CM | POA: Diagnosis not present

## 2017-01-20 DIAGNOSIS — I4819 Other persistent atrial fibrillation: Secondary | ICD-10-CM

## 2017-01-20 DIAGNOSIS — Z7901 Long term (current) use of anticoagulants: Secondary | ICD-10-CM

## 2017-01-20 DIAGNOSIS — Z5181 Encounter for therapeutic drug level monitoring: Secondary | ICD-10-CM | POA: Diagnosis not present

## 2017-01-20 LAB — POCT INR: INR: 1.9

## 2017-01-31 ENCOUNTER — Telehealth: Payer: Self-pay | Admitting: *Deleted

## 2017-01-31 NOTE — Telephone Encounter (Signed)
Patient's wife would like to speak with you in regards to medicine that PCP has him on that could alter INR levels. / tg

## 2017-02-01 NOTE — Progress Notes (Signed)
HPI  The patient presents for evaluation of mitral valve repair.  Echo 10/2015 demonstrated stable MV repair.  While in the hospital in 2017 for knee surgery he developed persistent atrial fib.  He had cardioversion but went back into atrial fib.  He wore a Holter monitor which demonstrated frequent 3.5 second pauses at night. His average heart rate was about 80.   Since I last saw him he has had  for treatment of his sleep apnea.    He feels well except he has a little more shortness of breath but he is gained about 10 pounds at least since I last saw him.  He denies any acute cardiovascular symptoms. The patient denies any new symptoms such as chest discomfort, neck or arm discomfort. There has been no new shortness of breath, PND or orthopnea. There have been no reported palpitations, presyncope or syncope.    Allergies  Allergen Reactions  . Flomax [Tamsulosin Hcl] Other (See Comments)    Makes him "swimmy headed"    Current Outpatient Medications  Medication Sig Dispense Refill  . acetaminophen (TYLENOL) 500 MG tablet Take 500 mg by mouth as needed.    Marland Kitchen amLODipine (NORVASC) 2.5 MG tablet Take 2.5 mg by mouth daily before breakfast.     . Coenzyme Q10 200 MG capsule Take 200 mg by mouth 2 (two) times daily.    Marland Kitchen lisinopril (PRINIVIL,ZESTRIL) 20 MG tablet TAKE 1 TABLET (20 MG TOTAL) BY MOUTH DAILY BEFORE BREAKFAST. 30 tablet 5  . MULTIPLE VITAMIN PO Take 1 tablet by mouth daily.    Marland Kitchen omeprazole (PRILOSEC) 20 MG capsule Take 20 mg by mouth daily before breakfast.     . vitamin C (ASCORBIC ACID) 500 MG tablet Take 500 mg by mouth daily.    Marland Kitchen warfarin (COUMADIN) 5 MG tablet TAKE 1 TABLET BY MOUTH DAILY OR AS DIRECTED BY COUMADIN CLINIC 90 tablet 0   No current facility-administered medications for this visit.     Past Medical History:  Diagnosis Date  . Allergy   . Arthritis    knees & hips  . BPH (benign prostatic hyperplasia)   . Coronary artery disease excluded   .  Diverticulosis   . Endocarditis, valve unspecified, unspecified cause   . GERD (gastroesophageal reflux disease)   . H/O mitral valve repair    Postoperative ring with postoperative atrial fibrillation  . Hiatal hernia 06/13/2002  . History of kidney stones    passed spontaneously x2   . Unspecified essential hypertension   . Weakness of both arms 07/19/2012    Past Surgical History:  Procedure Laterality Date  . CARDIOVERSION N/A 04/14/2016   Procedure: CARDIOVERSION;  Surgeon: Minus Breeding, MD;  Location: [facility];  Service: Cardiovascular;  Laterality: N/A;  . CATARACT EXTRACTION W/PHACO Right 05/12/2015   Procedure: CATARACT EXTRACTION PHACO AND INTRAOCULAR LENS PLACEMENT RIGHT EYE CDE=7.75;  Surgeon: Tonny Branch, MD;  Location: [facility];  Service: Ophthalmology;  Laterality: Right;  . CATARACT EXTRACTION W/PHACO Left 06/12/2015   Procedure: CATARACT EXTRACTION PHACO AND INTRAOCULAR LENS PLACEMENT (IOC);  Surgeon: Tonny Branch, MD;  Location: [facility];  Service: Ophthalmology;  Laterality: Left;  CDE:  13.17  . CHOLECYSTECTOMY    . INJECTION KNEE Right 02/20/2016   Procedure: KNEE INJECTION;  Surgeon: [name], MD;  Location: [place];  Service: Orthopedics;  Laterality: Right;  . KNEE SURGERY Left   . MITRAL VALVE REPAIR    . MV repair  2003  . ROTATOR CUFF REPAIR Left   . TOTAL KNEE ARTHROPLASTY Left 02/20/2016   Procedure: LEFT TOTAL KNEE ARTHROPLASTY WITH RIGHT KNEE INJECTION;  Surgeon: [name], MD;  Location: [place];  Service: Orthopedics;  Laterality: Left;  Marland Kitchen VASECTOMY      ROS:  As stated in the HPI and negative for all other systems.  PHYSICAL EXAM BP 130/78   Pulse 63   Ht 5\' 11"  (1.803 m)   Wt 243 lb (110.2 kg)   BMI 33.89 kg/m   GENERAL:  Well appearing NECK:  No jugular venous distention, waveform within normal limits, carotid upstroke brisk and symmetric, no bruits, no thyromegaly LUNGS:  Clear to auscultation bilaterally CHEST:  Unremarkable HEART:  PMI  not displaced or sustained,S1 and S2 within normal limits, no S3,  no clicks, no rubs, no  murmurs, irregular ABD:  Flat, positive bowel sounds normal in frequency in pitch, no bruits, no rebound, no guarding, no midline pulsatile mass, no hepatomegaly, no splenomegaly EXT:  2 plus pulses throughout, no edema, no cyanosis no clubbing    ASSESSMENT AND PLAN  ATRIAL FIB:    Mr. DEMETREUS LOTHAMER has a CHA2DS2 - VASc of 5.  He has bradycardia possibly related to sleep apnea.  No change in therapy is planned.   SLEEP APNEA:   He is having this managed.   MITRAL VALVE REPAIR:    I will repeat an echo as it has been well over a year.   HTN:  The blood pressure is at target.  No change in therapy.   OBESITY:   This is likely why he is a little more short of breath and I discussed this with him.  If he loses 10 pounds and he increased his shortness of breath I would want to see him back for further evaluation but I suspect he will improve with weight loss and exercise.

## 2017-02-01 NOTE — Telephone Encounter (Signed)
Spoke with wife.  States pt was started on CoQ10.  Wants to know if if can effect coumadin.  Interactions states it can either increase or decrease INR levels.  Reviewed chart.  Told her to have pt continue current coumadin dose and will reevaluate at next coumadin check.  She verbalized understanding.

## 2017-02-03 ENCOUNTER — Ambulatory Visit: Payer: Medicare Other | Admitting: Cardiology

## 2017-02-03 ENCOUNTER — Encounter: Payer: Self-pay | Admitting: Cardiology

## 2017-02-03 VITALS — BP 130/78 | HR 63 | Ht 71.0 in | Wt 243.0 lb

## 2017-02-03 DIAGNOSIS — I482 Chronic atrial fibrillation: Secondary | ICD-10-CM

## 2017-02-03 DIAGNOSIS — Z9889 Other specified postprocedural states: Secondary | ICD-10-CM

## 2017-02-03 DIAGNOSIS — I10 Essential (primary) hypertension: Secondary | ICD-10-CM

## 2017-02-03 DIAGNOSIS — I4821 Permanent atrial fibrillation: Secondary | ICD-10-CM

## 2017-02-03 NOTE — Patient Instructions (Signed)
Medication Instructions:  Continue current medications  If you need a refill on your cardiac medications before your next appointment, please call your pharmacy.  Labwork: None Ordered   Testing/Procedures: Your physician has requested that you have an echocardiogram. Echocardiography is a painless test that uses sound waves to create images of your heart. It provides your doctor with information about the size and shape of your heart and how well your heart's chambers and valves are working. This procedure takes approximately one hour. There are no restrictions for this procedure.  Special Instructions:  Happy Holidays!!  Follow-Up: Your physician wants you to follow-up in: 1 Year. You should receive a reminder letter in the mail two months in advance. If you do not receive a letter, please call our office [phone].    Thank you for choosing [facility] at [facility]!!

## 2017-02-10 ENCOUNTER — Other Ambulatory Visit: Payer: Self-pay

## 2017-02-10 ENCOUNTER — Ambulatory Visit (HOSPITAL_COMMUNITY): Payer: Medicare Other | Attending: Cardiovascular Disease

## 2017-02-10 ENCOUNTER — Ambulatory Visit (INDEPENDENT_AMBULATORY_CARE_PROVIDER_SITE_OTHER): Payer: Medicare Other | Admitting: *Deleted

## 2017-02-10 DIAGNOSIS — I059 Rheumatic mitral valve disease, unspecified: Secondary | ICD-10-CM

## 2017-02-10 DIAGNOSIS — I481 Persistent atrial fibrillation: Secondary | ICD-10-CM | POA: Diagnosis not present

## 2017-02-10 DIAGNOSIS — Z5181 Encounter for therapeutic drug level monitoring: Secondary | ICD-10-CM

## 2017-02-10 DIAGNOSIS — Z9889 Other specified postprocedural states: Secondary | ICD-10-CM

## 2017-02-10 DIAGNOSIS — R531 Weakness: Secondary | ICD-10-CM | POA: Diagnosis not present

## 2017-02-10 DIAGNOSIS — I4819 Other persistent atrial fibrillation: Secondary | ICD-10-CM

## 2017-02-10 DIAGNOSIS — G4730 Sleep apnea, unspecified: Secondary | ICD-10-CM | POA: Diagnosis not present

## 2017-02-10 DIAGNOSIS — R0600 Dyspnea, unspecified: Secondary | ICD-10-CM | POA: Insufficient documentation

## 2017-02-10 DIAGNOSIS — Z7901 Long term (current) use of anticoagulants: Secondary | ICD-10-CM | POA: Diagnosis not present

## 2017-02-10 DIAGNOSIS — I10 Essential (primary) hypertension: Secondary | ICD-10-CM | POA: Diagnosis not present

## 2017-02-10 LAB — POCT INR: INR: 2.6

## 2017-02-21 ENCOUNTER — Telehealth: Payer: Self-pay | Admitting: Cardiology

## 2017-02-21 NOTE — Telephone Encounter (Signed)
Returned call to wife (ok per DPR)-aware of results and verbalized understanding.   States she read this on MyChart and saw his left atrium is severely dilated and did not see this on his prior echo.  Advised prior echo did show mild to mod dilation and at this time Dr. Percival Spanish did not recommend any changes.  Advised I would verify with him but at current continue current therapy and follow up as planned.   Wife aware and verbalized understanding.

## 2017-02-21 NOTE — Telephone Encounter (Signed)
New Message  Pt wife call to get results from pt echo

## 2017-02-22 NOTE — Telephone Encounter (Signed)
Pt with pt wife (DPR) about Dr Percival Spanish recomendation

## 2017-02-22 NOTE — Telephone Encounter (Signed)
Continue current therapy 

## 2017-03-03 ENCOUNTER — Ambulatory Visit (INDEPENDENT_AMBULATORY_CARE_PROVIDER_SITE_OTHER): Payer: Medicare Other | Admitting: *Deleted

## 2017-03-03 DIAGNOSIS — I4891 Unspecified atrial fibrillation: Secondary | ICD-10-CM

## 2017-03-03 DIAGNOSIS — Z7901 Long term (current) use of anticoagulants: Secondary | ICD-10-CM | POA: Diagnosis not present

## 2017-03-03 DIAGNOSIS — I481 Persistent atrial fibrillation: Secondary | ICD-10-CM

## 2017-03-03 DIAGNOSIS — I4819 Other persistent atrial fibrillation: Secondary | ICD-10-CM

## 2017-03-03 DIAGNOSIS — Z5181 Encounter for therapeutic drug level monitoring: Secondary | ICD-10-CM

## 2017-03-03 LAB — POCT INR: INR: 2.7

## 2017-03-03 NOTE — Patient Instructions (Signed)
Continue coumadin 1 tablet daily except 1/2 tablet on Tuesdays Continue greens 2 x week Recheck in 4 weeks

## 2017-03-31 ENCOUNTER — Ambulatory Visit (INDEPENDENT_AMBULATORY_CARE_PROVIDER_SITE_OTHER): Payer: Medicare Other | Admitting: *Deleted

## 2017-03-31 DIAGNOSIS — I4891 Unspecified atrial fibrillation: Secondary | ICD-10-CM

## 2017-03-31 DIAGNOSIS — Z5181 Encounter for therapeutic drug level monitoring: Secondary | ICD-10-CM | POA: Diagnosis not present

## 2017-03-31 DIAGNOSIS — I481 Persistent atrial fibrillation: Secondary | ICD-10-CM | POA: Diagnosis not present

## 2017-03-31 DIAGNOSIS — Z7901 Long term (current) use of anticoagulants: Secondary | ICD-10-CM | POA: Diagnosis not present

## 2017-03-31 DIAGNOSIS — I4819 Other persistent atrial fibrillation: Secondary | ICD-10-CM

## 2017-03-31 LAB — POCT INR: INR: 2.5

## 2017-03-31 NOTE — Patient Instructions (Signed)
Continue coumadin 1 tablet daily except 1/2 tablet on Tuesdays Continue greens 2 x week Recheck in 6 weeks

## 2017-04-11 ENCOUNTER — Other Ambulatory Visit: Payer: Self-pay | Admitting: Cardiology

## 2017-04-14 ENCOUNTER — Other Ambulatory Visit: Payer: Self-pay | Admitting: Cardiology

## 2017-05-09 ENCOUNTER — Telehealth: Payer: Self-pay | Admitting: *Deleted

## 2017-05-09 NOTE — Telephone Encounter (Signed)
Patient went to the PCP today in regards to flu. He has been placed on Cipro 250mg  twice daily and Tamiflu 75 mg twice daily. Wants to know if this will interfere with his coumdin.  Please call [phone].

## 2017-05-10 NOTE — Telephone Encounter (Signed)
Patient checking again about what to do about medication

## 2017-05-10 NOTE — Telephone Encounter (Signed)
Spoke with wife.  Informed cipro does interact with warfarin.  Pt to hold coumadin tonight, take 1/2 tablet tomorrow night and come for INR check on Thursday as scheduled.  She verbalized understanding.

## 2017-05-12 ENCOUNTER — Ambulatory Visit (INDEPENDENT_AMBULATORY_CARE_PROVIDER_SITE_OTHER): Payer: Medicare Other | Admitting: *Deleted

## 2017-05-12 DIAGNOSIS — I4891 Unspecified atrial fibrillation: Secondary | ICD-10-CM

## 2017-05-12 DIAGNOSIS — I481 Persistent atrial fibrillation: Secondary | ICD-10-CM

## 2017-05-12 DIAGNOSIS — Z7901 Long term (current) use of anticoagulants: Secondary | ICD-10-CM

## 2017-05-12 DIAGNOSIS — I4819 Other persistent atrial fibrillation: Secondary | ICD-10-CM

## 2017-05-12 DIAGNOSIS — Z5181 Encounter for therapeutic drug level monitoring: Secondary | ICD-10-CM

## 2017-05-12 LAB — POCT INR: INR: 1.4

## 2017-05-12 NOTE — Patient Instructions (Signed)
Take 1 1/2 tablets tonight then resume 1 tablet daily except 1/2 tablet on Tuesdays Hold greens for now Recheck in 1 week

## 2017-05-17 ENCOUNTER — Ambulatory Visit (INDEPENDENT_AMBULATORY_CARE_PROVIDER_SITE_OTHER): Payer: Medicare Other | Admitting: *Deleted

## 2017-05-17 DIAGNOSIS — I4891 Unspecified atrial fibrillation: Secondary | ICD-10-CM | POA: Diagnosis not present

## 2017-05-17 DIAGNOSIS — Z7901 Long term (current) use of anticoagulants: Secondary | ICD-10-CM

## 2017-05-17 DIAGNOSIS — I481 Persistent atrial fibrillation: Secondary | ICD-10-CM

## 2017-05-17 DIAGNOSIS — I4819 Other persistent atrial fibrillation: Secondary | ICD-10-CM

## 2017-05-17 DIAGNOSIS — Z5181 Encounter for therapeutic drug level monitoring: Secondary | ICD-10-CM

## 2017-05-17 LAB — POCT INR: INR: 2.7

## 2017-05-17 NOTE — Patient Instructions (Signed)
Decrease coumadin to 1 tablet daily except 1/2 tablet on Tuesdays and Fridays till INR check on 4/9 On Cipro 500mg  bid x 3 weeks for prostate infection.  Has f/u with MD on 4/23. Recheck in 1 week

## 2017-05-24 ENCOUNTER — Ambulatory Visit (INDEPENDENT_AMBULATORY_CARE_PROVIDER_SITE_OTHER): Payer: Medicare Other | Admitting: *Deleted

## 2017-05-24 DIAGNOSIS — I481 Persistent atrial fibrillation: Secondary | ICD-10-CM

## 2017-05-24 DIAGNOSIS — Z7901 Long term (current) use of anticoagulants: Secondary | ICD-10-CM

## 2017-05-24 DIAGNOSIS — I4891 Unspecified atrial fibrillation: Secondary | ICD-10-CM

## 2017-05-24 DIAGNOSIS — I4819 Other persistent atrial fibrillation: Secondary | ICD-10-CM

## 2017-05-24 DIAGNOSIS — Z5181 Encounter for therapeutic drug level monitoring: Secondary | ICD-10-CM

## 2017-05-24 LAB — POCT INR: INR: 2.4

## 2017-05-24 NOTE — Patient Instructions (Signed)
Continue coumadin 1 tablet daily except 1/2 tablet on Tuesdays and Fridays On Cipro 500mg  bid x 3 weeks for prostate infection.  Has f/u with MD on 4/23. Recheck in 2 weeks

## 2017-06-09 ENCOUNTER — Ambulatory Visit (INDEPENDENT_AMBULATORY_CARE_PROVIDER_SITE_OTHER): Payer: Medicare Other | Admitting: *Deleted

## 2017-06-09 DIAGNOSIS — Z9889 Other specified postprocedural states: Secondary | ICD-10-CM | POA: Diagnosis not present

## 2017-06-09 DIAGNOSIS — I481 Persistent atrial fibrillation: Secondary | ICD-10-CM

## 2017-06-09 DIAGNOSIS — Z7901 Long term (current) use of anticoagulants: Secondary | ICD-10-CM | POA: Diagnosis not present

## 2017-06-09 DIAGNOSIS — Z5181 Encounter for therapeutic drug level monitoring: Secondary | ICD-10-CM | POA: Diagnosis not present

## 2017-06-09 DIAGNOSIS — I4819 Other persistent atrial fibrillation: Secondary | ICD-10-CM

## 2017-06-09 LAB — POCT INR: INR: 2

## 2017-06-09 NOTE — Patient Instructions (Signed)
Take coumadin 1 1/2 tablets tonight then resume 1 tablet daily except 1/2 tablet on Tuesdays and Fridays Finished cipro yesterday Recheck in 3 weeks

## 2017-06-30 ENCOUNTER — Ambulatory Visit (INDEPENDENT_AMBULATORY_CARE_PROVIDER_SITE_OTHER): Payer: Medicare Other | Admitting: *Deleted

## 2017-06-30 DIAGNOSIS — I4891 Unspecified atrial fibrillation: Secondary | ICD-10-CM

## 2017-06-30 DIAGNOSIS — I4819 Other persistent atrial fibrillation: Secondary | ICD-10-CM

## 2017-06-30 DIAGNOSIS — Z7901 Long term (current) use of anticoagulants: Secondary | ICD-10-CM

## 2017-06-30 DIAGNOSIS — I481 Persistent atrial fibrillation: Secondary | ICD-10-CM

## 2017-06-30 DIAGNOSIS — Z5181 Encounter for therapeutic drug level monitoring: Secondary | ICD-10-CM

## 2017-06-30 LAB — POCT INR: INR: 2.3

## 2017-06-30 NOTE — Patient Instructions (Signed)
Continue coumadin 1 tablet daily except 1/2 tablet on Tuesdays and Fridays.  Recheck in 4 weeks 

## 2017-07-28 ENCOUNTER — Ambulatory Visit (INDEPENDENT_AMBULATORY_CARE_PROVIDER_SITE_OTHER): Payer: Medicare Other | Admitting: *Deleted

## 2017-07-28 DIAGNOSIS — Z7901 Long term (current) use of anticoagulants: Secondary | ICD-10-CM | POA: Diagnosis not present

## 2017-07-28 DIAGNOSIS — Z5181 Encounter for therapeutic drug level monitoring: Secondary | ICD-10-CM

## 2017-07-28 DIAGNOSIS — I481 Persistent atrial fibrillation: Secondary | ICD-10-CM

## 2017-07-28 DIAGNOSIS — I4891 Unspecified atrial fibrillation: Secondary | ICD-10-CM

## 2017-07-28 DIAGNOSIS — I4819 Other persistent atrial fibrillation: Secondary | ICD-10-CM

## 2017-07-28 LAB — POCT INR: INR: 1.9 — AB (ref 2.0–3.0)

## 2017-07-28 NOTE — Patient Instructions (Signed)
Increase coumadin to 1 tablet daily except 1/2 tablet on Tuesdays Recheck in 3 weeks 

## 2017-08-16 ENCOUNTER — Ambulatory Visit (INDEPENDENT_AMBULATORY_CARE_PROVIDER_SITE_OTHER): Payer: Medicare Other | Admitting: *Deleted

## 2017-08-16 DIAGNOSIS — I481 Persistent atrial fibrillation: Secondary | ICD-10-CM

## 2017-08-16 DIAGNOSIS — Z7901 Long term (current) use of anticoagulants: Secondary | ICD-10-CM | POA: Diagnosis not present

## 2017-08-16 DIAGNOSIS — Z5181 Encounter for therapeutic drug level monitoring: Secondary | ICD-10-CM | POA: Diagnosis not present

## 2017-08-16 DIAGNOSIS — I4891 Unspecified atrial fibrillation: Secondary | ICD-10-CM | POA: Diagnosis not present

## 2017-08-16 DIAGNOSIS — I4819 Other persistent atrial fibrillation: Secondary | ICD-10-CM

## 2017-08-16 LAB — POCT INR: INR: 2.7 (ref 2.0–3.0)

## 2017-08-16 NOTE — Patient Instructions (Signed)
Continue coumadin 1 tablet daily except 1/2 tablet on Tuesdays Recheck in 4 weeks 

## 2017-09-06 ENCOUNTER — Other Ambulatory Visit: Payer: Self-pay | Admitting: Cardiology

## 2017-09-13 ENCOUNTER — Ambulatory Visit: Payer: Medicare Other | Admitting: *Deleted

## 2017-09-13 DIAGNOSIS — I4819 Other persistent atrial fibrillation: Secondary | ICD-10-CM

## 2017-09-13 DIAGNOSIS — I481 Persistent atrial fibrillation: Secondary | ICD-10-CM

## 2017-09-13 DIAGNOSIS — I4891 Unspecified atrial fibrillation: Secondary | ICD-10-CM

## 2017-09-13 DIAGNOSIS — Z5181 Encounter for therapeutic drug level monitoring: Secondary | ICD-10-CM | POA: Diagnosis not present

## 2017-09-13 DIAGNOSIS — Z7901 Long term (current) use of anticoagulants: Secondary | ICD-10-CM

## 2017-09-13 LAB — POCT INR: INR: 2.7 (ref 2.0–3.0)

## 2017-09-13 NOTE — Patient Instructions (Signed)
Continue coumadin 1 tablet daily except 1/2 tablet on Tuesdays  Recheck in 5 weeks

## 2017-10-18 ENCOUNTER — Ambulatory Visit: Payer: Medicare Other | Admitting: *Deleted

## 2017-10-18 DIAGNOSIS — I481 Persistent atrial fibrillation: Secondary | ICD-10-CM

## 2017-10-18 DIAGNOSIS — I4819 Other persistent atrial fibrillation: Secondary | ICD-10-CM

## 2017-10-18 DIAGNOSIS — Z5181 Encounter for therapeutic drug level monitoring: Secondary | ICD-10-CM

## 2017-10-18 DIAGNOSIS — Z7901 Long term (current) use of anticoagulants: Secondary | ICD-10-CM | POA: Diagnosis not present

## 2017-10-18 LAB — POCT INR: INR: 2.8 (ref 2.0–3.0)

## 2017-10-18 NOTE — Patient Instructions (Signed)
Continue coumadin 1 tablet daily except 1/2 tablet on Tuesdays Recheck in 6 weeks 

## 2017-10-24 ENCOUNTER — Observation Stay (HOSPITAL_COMMUNITY): Payer: Medicare Other

## 2017-10-24 ENCOUNTER — Other Ambulatory Visit: Payer: Self-pay

## 2017-10-24 ENCOUNTER — Ambulatory Visit (HOSPITAL_BASED_OUTPATIENT_CLINIC_OR_DEPARTMENT_OTHER): Payer: Medicare Other

## 2017-10-24 ENCOUNTER — Encounter (HOSPITAL_COMMUNITY): Payer: Self-pay | Admitting: *Deleted

## 2017-10-24 ENCOUNTER — Emergency Department (HOSPITAL_COMMUNITY): Payer: Medicare Other

## 2017-10-24 ENCOUNTER — Observation Stay (HOSPITAL_COMMUNITY)
Admission: EM | Admit: 2017-10-24 | Discharge: 2017-10-25 | Disposition: A | Discharge status: Home / Self Care | Payer: Medicare Other | Attending: Internal Medicine | Admitting: Internal Medicine

## 2017-10-24 DIAGNOSIS — E119 Type 2 diabetes mellitus without complications: Secondary | ICD-10-CM

## 2017-10-24 DIAGNOSIS — I2510 Atherosclerotic heart disease of native coronary artery without angina pectoris: Secondary | ICD-10-CM | POA: Insufficient documentation

## 2017-10-24 DIAGNOSIS — E785 Hyperlipidemia, unspecified: Secondary | ICD-10-CM | POA: Diagnosis not present

## 2017-10-24 DIAGNOSIS — Z6833 Body mass index (BMI) 33.0-33.9, adult: Secondary | ICD-10-CM | POA: Diagnosis not present

## 2017-10-24 DIAGNOSIS — Z87891 Personal history of nicotine dependence: Secondary | ICD-10-CM | POA: Insufficient documentation

## 2017-10-24 DIAGNOSIS — E669 Obesity, unspecified: Secondary | ICD-10-CM | POA: Diagnosis not present

## 2017-10-24 DIAGNOSIS — G4733 Obstructive sleep apnea (adult) (pediatric): Secondary | ICD-10-CM | POA: Diagnosis not present

## 2017-10-24 DIAGNOSIS — Z96652 Presence of left artificial knee joint: Secondary | ICD-10-CM | POA: Diagnosis not present

## 2017-10-24 DIAGNOSIS — Z7901 Long term (current) use of anticoagulants: Secondary | ICD-10-CM | POA: Diagnosis not present

## 2017-10-24 DIAGNOSIS — I351 Nonrheumatic aortic (valve) insufficiency: Secondary | ICD-10-CM | POA: Diagnosis not present

## 2017-10-24 DIAGNOSIS — K219 Gastro-esophageal reflux disease without esophagitis: Secondary | ICD-10-CM | POA: Insufficient documentation

## 2017-10-24 DIAGNOSIS — I639 Cerebral infarction, unspecified: Principal | ICD-10-CM | POA: Diagnosis present

## 2017-10-24 DIAGNOSIS — H532 Diplopia: Secondary | ICD-10-CM | POA: Diagnosis not present

## 2017-10-24 DIAGNOSIS — I6523 Occlusion and stenosis of bilateral carotid arteries: Secondary | ICD-10-CM | POA: Insufficient documentation

## 2017-10-24 DIAGNOSIS — Z79899 Other long term (current) drug therapy: Secondary | ICD-10-CM | POA: Insufficient documentation

## 2017-10-24 DIAGNOSIS — I6349 Cerebral infarction due to embolism of other cerebral artery: Secondary | ICD-10-CM

## 2017-10-24 DIAGNOSIS — I10 Essential (primary) hypertension: Secondary | ICD-10-CM | POA: Diagnosis not present

## 2017-10-24 DIAGNOSIS — R1310 Dysphagia, unspecified: Secondary | ICD-10-CM | POA: Insufficient documentation

## 2017-10-24 DIAGNOSIS — E1140 Type 2 diabetes mellitus with diabetic neuropathy, unspecified: Secondary | ICD-10-CM

## 2017-10-24 DIAGNOSIS — E1151 Type 2 diabetes mellitus with diabetic peripheral angiopathy without gangrene: Secondary | ICD-10-CM | POA: Insufficient documentation

## 2017-10-24 DIAGNOSIS — Z952 Presence of prosthetic heart valve: Secondary | ICD-10-CM | POA: Insufficient documentation

## 2017-10-24 DIAGNOSIS — I482 Chronic atrial fibrillation: Secondary | ICD-10-CM | POA: Diagnosis not present

## 2017-10-24 DIAGNOSIS — G4730 Sleep apnea, unspecified: Secondary | ICD-10-CM | POA: Diagnosis present

## 2017-10-24 DIAGNOSIS — H5340 Unspecified visual field defects: Secondary | ICD-10-CM | POA: Diagnosis present

## 2017-10-24 DIAGNOSIS — I6529 Occlusion and stenosis of unspecified carotid artery: Secondary | ICD-10-CM | POA: Diagnosis present

## 2017-10-24 DIAGNOSIS — I4891 Unspecified atrial fibrillation: Secondary | ICD-10-CM | POA: Diagnosis present

## 2017-10-24 HISTORY — DX: Pure hypercholesterolemia, unspecified: E78.00

## 2017-10-24 HISTORY — DX: Obstructive sleep apnea (adult) (pediatric): Z99.89

## 2017-10-24 HISTORY — DX: Obstructive sleep apnea (adult) (pediatric): G47.33

## 2017-10-24 LAB — COMPREHENSIVE METABOLIC PANEL
ALBUMIN: 4.1 g/dL (ref 3.5–5.0)
ALK PHOS: 68 U/L (ref 38–126)
ALT: 18 U/L (ref 0–44)
ANION GAP: 8 (ref 5–15)
AST: 22 U/L (ref 15–41)
BILIRUBIN TOTAL: 0.9 mg/dL (ref 0.3–1.2)
BUN: 14 mg/dL (ref 8–23)
CALCIUM: 8.6 mg/dL — AB (ref 8.9–10.3)
CO2: 28 mmol/L (ref 22–32)
Chloride: 104 mmol/L (ref 98–111)
Creatinine, Ser: 0.78 mg/dL (ref 0.61–1.24)
GLUCOSE: 124 mg/dL — AB (ref 70–99)
Potassium: 4.1 mmol/L (ref 3.5–5.1)
Sodium: 140 mmol/L (ref 135–145)
Total Protein: 7.2 g/dL (ref 6.5–8.1)

## 2017-10-24 LAB — PROTIME-INR
INR: 2.21
PROTHROMBIN TIME: 24.4 s — AB (ref 11.4–15.2)

## 2017-10-24 LAB — I-STAT CHEM 8, ED
BUN: 17 mg/dL (ref 8–23)
CHLORIDE: 103 mmol/L (ref 98–111)
CREATININE: 0.7 mg/dL (ref 0.61–1.24)
Calcium, Ion: 1.04 mmol/L — ABNORMAL LOW (ref 1.15–1.40)
GLUCOSE: 119 mg/dL — AB (ref 70–99)
HCT: 46 % (ref 39.0–52.0)
Hemoglobin: 15.6 g/dL (ref 13.0–17.0)
POTASSIUM: 4.1 mmol/L (ref 3.5–5.1)
SODIUM: 141 mmol/L (ref 135–145)
TCO2: 28 mmol/L (ref 22–32)

## 2017-10-24 LAB — CBC
HEMATOCRIT: 47.2 % (ref 39.0–52.0)
Hemoglobin: 15.9 g/dL (ref 13.0–17.0)
MCH: 31.9 pg (ref 26.0–34.0)
MCHC: 33.7 g/dL (ref 30.0–36.0)
MCV: 94.8 fL (ref 78.0–100.0)
PLATELETS: 225 10*3/uL (ref 150–400)
RBC: 4.98 MIL/uL (ref 4.22–5.81)
RDW: 12.9 % (ref 11.5–15.5)
WBC: 6.9 10*3/uL (ref 4.0–10.5)

## 2017-10-24 LAB — ECHOCARDIOGRAM COMPLETE
Height: 71 in
Weight: 3808 [oz_av]

## 2017-10-24 LAB — RAPID URINE DRUG SCREEN, HOSP PERFORMED
AMPHETAMINES: NOT DETECTED
BARBITURATES: NOT DETECTED
Benzodiazepines: NOT DETECTED
Cocaine: NOT DETECTED
Opiates: NOT DETECTED
TETRAHYDROCANNABINOL: NOT DETECTED

## 2017-10-24 LAB — DIFFERENTIAL
Abs Immature Granulocytes: 0 10*3/uL (ref 0.0–0.1)
Basophils Absolute: 0 10*3/uL (ref 0.0–0.1)
Basophils Relative: 1 %
EOS ABS: 0.1 10*3/uL (ref 0.0–0.7)
EOS PCT: 2 %
Immature Granulocytes: 1 %
LYMPHS ABS: 1.7 10*3/uL (ref 0.7–4.0)
LYMPHS PCT: 24 %
MONO ABS: 0.6 10*3/uL (ref 0.1–1.0)
Monocytes Relative: 9 %
Neutro Abs: 4.4 10*3/uL (ref 1.7–7.7)
Neutrophils Relative %: 63 %

## 2017-10-24 LAB — I-STAT TROPONIN, ED: TROPONIN I, POC: 0.02 ng/mL (ref 0.00–0.08)

## 2017-10-24 LAB — TSH: TSH: 1.447 u[IU]/mL (ref 0.350–4.500)

## 2017-10-24 LAB — APTT: aPTT: 36 seconds (ref 24–36)

## 2017-10-24 MED ORDER — WARFARIN SODIUM 5 MG PO TABS
5.0000 mg | ORAL_TABLET | Freq: Once | ORAL | Status: AC
  Administered 2017-10-24: 5 mg via ORAL
  Filled 2017-10-24 (×2): qty 1

## 2017-10-24 MED ORDER — WARFARIN SODIUM 2.5 MG PO TABS: 2.5000 mg | ORAL_TABLET | ORAL | Status: DC

## 2017-10-24 MED ORDER — SODIUM CHLORIDE 0.9 % IV SOLN
INTRAVENOUS | Status: DC
  Administered 2017-10-24: 19:00:00 via INTRAVENOUS

## 2017-10-24 MED ORDER — ACETAMINOPHEN 325 MG PO TABS: 650.0000 mg | ORAL_TABLET | ORAL | Status: DC | PRN

## 2017-10-24 MED ORDER — SENNOSIDES-DOCUSATE SODIUM 8.6-50 MG PO TABS: 1.0000 | ORAL_TABLET | Freq: Every evening | ORAL | Status: DC | PRN

## 2017-10-24 MED ORDER — ATORVASTATIN CALCIUM 80 MG PO TABS
80.0000 mg | ORAL_TABLET | Freq: Every day | ORAL | Status: DC
  Administered 2017-10-24: 80 mg via ORAL
  Filled 2017-10-24: qty 1

## 2017-10-24 MED ORDER — ASPIRIN EC 81 MG PO TBEC
81.0000 mg | DELAYED_RELEASE_TABLET | Freq: Every day | ORAL | Status: DC
  Administered 2017-10-25: 81 mg via ORAL
  Filled 2017-10-24: qty 1

## 2017-10-24 MED ORDER — STROKE: EARLY STAGES OF RECOVERY BOOK
Freq: Once | Status: AC
  Administered 2017-10-25: 02:00:00
  Filled 2017-10-24 (×2): qty 1

## 2017-10-24 MED ORDER — PANTOPRAZOLE SODIUM 40 MG PO TBEC
40.0000 mg | DELAYED_RELEASE_TABLET | Freq: Every day | ORAL | Status: DC
  Administered 2017-10-24 – 2017-10-25 (×2): 40 mg via ORAL
  Filled 2017-10-24 (×2): qty 1

## 2017-10-24 MED ORDER — ACETAMINOPHEN 160 MG/5ML PO SOLN: 650.0000 mg | ORAL | Status: DC | PRN

## 2017-10-24 MED ORDER — ACETAMINOPHEN 650 MG RE SUPP: 650.0000 mg | RECTAL | Status: DC | PRN

## 2017-10-24 MED ORDER — WARFARIN - PHARMACIST DOSING INPATIENT: Freq: Every day | Status: DC

## 2017-10-24 NOTE — Progress Notes (Signed)
Paged PA Bodenheimer, patients HR dropped multiple times below 55 while sleeping. No distress noted.   Patient states he doesn't want to wear a CPAP tonight because "I don't know what mine is set on". Offered O2 via nasal cannula for comfort. Started on 2L.

## 2017-10-24 NOTE — ED Triage Notes (Signed)
Pt reports waking up this am with vision changes. States he has double vision but vision clears up when closing one eye. Went to eye dr and sent here for MRI.

## 2017-10-24 NOTE — ED Provider Notes (Signed)
[facility] EMERGENCY DEPARTMENT Provider Note   CSN: [id number] Arrival date & time: 10/24/17  1107     History   Chief Complaint Chief Complaint  Patient presents with  . Visual Field Change    HPI [name] is a 82 y.o. male.  The history is provided by the patient, medical records and the spouse. No language interpreter was used.  Neurologic Problem  This is a recurrent problem. The current episode started 12 to 24 hours ago. The problem occurs constantly. The problem has not changed since onset.Pertinent negatives include no chest pain, no abdominal pain, no headaches and no shortness of breath. Nothing aggravates the symptoms. Nothing relieves the symptoms. He has tried nothing for the symptoms. The treatment provided no relief.    Past Medical History:  Diagnosis Date  . Allergy   . Arthritis    knees & hips  . BPH (benign prostatic hyperplasia)   . Coronary artery disease excluded   . Diverticulosis   . Endocarditis, valve unspecified, unspecified cause   . GERD (gastroesophageal reflux disease)   . H/O mitral valve repair    Postoperative ring with postoperative atrial fibrillation  . Hiatal hernia 06/13/2002  . History of kidney stones    passed spontaneously x2   . Unspecified essential hypertension   . Weakness of both arms 07/19/2012    Patient Active Problem List   Diagnosis Date Noted  . S/P total knee arthroplasty, left 04/29/2016  . Monitoring for long-term anticoagulant use 03/15/2016  . Weakness of both arms 07/19/2012  . Colon cancer screening 06/22/2012  . Unspecified venous (peripheral) insufficiency 05/05/2012  . Chronic venous insufficiency 04/07/2012  . Other malaise and fatigue 03/16/2012  . Disturbance of skin sensation 03/16/2012  . Carotid stenosis 08/16/2011  . Obesity 08/16/2011  . Coronary artery disease excluded   . H/O mitral valve repair   . Endocarditis, valve unspecified, unspecified cause   .  Dizziness 12/16/2010  . Dyslipidemia 09/23/2010  . Essential hypertension 04/06/2007  . ATRIAL FIBRILLATION 04/06/2007  . Sleep apnea 04/06/2007  . DYSPNEA 04/06/2007    Past Surgical History:  Procedure Laterality Date  . CARDIOVERSION N/A 04/14/2016   Procedure: CARDIOVERSION;  Surgeon: Minus Breeding, MD;  Location: [facility] ENDOSCOPY;  Service: Cardiovascular;  Laterality: N/A;  . CATARACT EXTRACTION W/PHACO Right 05/12/2015   Procedure: CATARACT EXTRACTION PHACO AND INTRAOCULAR LENS PLACEMENT RIGHT EYE CDE=7.75;  Surgeon: Tonny Branch, MD;  Location: [facility] ORS;  Service: Ophthalmology;  Laterality: Right;  . CATARACT EXTRACTION W/PHACO Left 06/12/2015   Procedure: CATARACT EXTRACTION PHACO AND INTRAOCULAR LENS PLACEMENT (IOC);  Surgeon: Tonny Branch, MD;  Location: [facility] ORS;  Service: Ophthalmology;  Laterality: Left;  CDE:  13.17  . CHOLECYSTECTOMY    . INJECTION KNEE Right 02/20/2016   Procedure: KNEE INJECTION;  Surgeon: [name], MD;  Location: [place];  Service: Orthopedics;  Laterality: Right;  . KNEE SURGERY Left   . MITRAL VALVE REPAIR    . MV repair     2003  . ROTATOR CUFF REPAIR Left   . TOTAL KNEE ARTHROPLASTY Left 02/20/2016   Procedure: LEFT TOTAL KNEE ARTHROPLASTY WITH RIGHT KNEE INJECTION;  Surgeon: [name], MD;  Location: [place];  Service: Orthopedics;  Laterality: Left;  Marland Kitchen VASECTOMY          Home Medications    Prior to Admission medications   Medication Sig Start Date End Date Taking? Authorizing Provider  acetaminophen (TYLENOL) 500 MG tablet  Take 500 mg by mouth as needed.    [provider]  amLODipine (NORVASC) 2.5 MG tablet Take 2.5 mg by mouth daily before breakfast.  03/13/12   [provider]  Coenzyme Q10 200 MG capsule Take 200 mg by mouth 2 (two) times daily.    [provider]  lisinopril (PRINIVIL,ZESTRIL) 20 MG tablet TAKE 1 TABLET (20 MG TOTAL) BY MOUTH DAILY BEFORE BREAKFAST. 09/06/17   Minus Breeding, MD  MULTIPLE VITAMIN PO  Take 1 tablet by mouth daily.    [provider]  omeprazole (PRILOSEC) 20 MG capsule Take 20 mg by mouth daily before breakfast.     [provider]  vitamin C (ASCORBIC ACID) 500 MG tablet Take 500 mg by mouth daily.    [provider]  warfarin (COUMADIN) 5 MG tablet TAKE 1 TABLET BY MOUTH DAILY OR AS DIRECTED BY COUMADIN CLINIC 04/11/17   Minus Breeding, MD    Family History Family History  Problem Relation Age of Onset  . Congestive Heart Failure Father   . Heart disease Father   . Stroke Brother   . Stroke Sister   . Diabetes Mellitus II Brother   . Diabetes Mellitus II Sister   . Lung cancer Brother   . Colon cancer Neg Hx   . Esophageal cancer Neg Hx   . Rectal cancer Neg Hx   . Stomach cancer Neg Hx     Social History Social History   Tobacco Use  . Smoking status: Former Smoker    Packs/day: 3.00    Years: 35.00    Pack years: 105.00    Types: Cigarettes    Last attempt to quit: 02/16/1988    Years since quitting: 29.7  . Smokeless tobacco: Never Used  . Tobacco comment:  Year Quit: 1990   Substance Use Topics  . Alcohol use: No  . Drug use: No     Allergies   Flomax [tamsulosin hcl]   Review of Systems Review of Systems  Constitutional: Negative for chills, diaphoresis and fatigue.  HENT: Negative for congestion.   Eyes: Positive for visual disturbance. Negative for photophobia.  Respiratory: Negative for cough, chest tightness, shortness of breath and wheezing.   Cardiovascular: Negative for chest pain and palpitations.  Gastrointestinal: Negative for abdominal pain, constipation, diarrhea, nausea and vomiting.  Genitourinary: Negative for flank pain.  Musculoskeletal: Negative for back pain, neck pain and neck stiffness.  Skin: Negative for rash and wound.  Neurological: Negative for dizziness, facial asymmetry, speech difficulty, weakness, light-headedness, numbness and headaches.  Psychiatric/Behavioral: Negative for  agitation and confusion.  All other systems reviewed and are negative.    Physical Exam Updated Vital Signs BP 122/80   Pulse (!) 46   Temp 98 F (36.7 C) (Oral)   Resp 19   Ht 5\' 11"  (1.803 m)   Wt 108 kg   SpO2 98%   BMI 33.19 kg/m   Physical Exam  Constitutional: He is oriented to person, place, and time. He appears well-developed and well-nourished. No distress.  HENT:  Head: Normocephalic and atraumatic.  Nose: Nose normal.  Mouth/Throat: Oropharynx is clear and moist. No oropharyngeal exudate.  Eyes: Pupils are equal, round, and reactive to light. Conjunctivae and EOM are normal.  Neck: Normal range of motion. Neck supple.  Cardiovascular: Normal rate and regular rhythm.  No murmur heard. Pulmonary/Chest: Effort normal and breath sounds normal. No respiratory distress. He has no wheezes. He exhibits no tenderness.  Abdominal: Soft. He exhibits  no distension. There is no tenderness.  Musculoskeletal: He exhibits no edema or tenderness.  Neurological: He is alert and oriented to person, place, and time. A cranial nerve deficit is present. No sensory deficit. He exhibits normal muscle tone. Coordination normal.  Skin: Skin is warm and dry. Capillary refill takes less than 2 seconds. He is not diaphoretic. No erythema. No pallor.  Psychiatric: He has a normal mood and affect.  Nursing note and vitals reviewed.    ED Treatments / Results  Labs (all labs ordered are listed, but only abnormal results are displayed) Labs Reviewed  PROTIME-INR - Abnormal; Notable for the following components:      Result Value   Prothrombin Time 24.4 (*)    All other components within normal limits  COMPREHENSIVE METABOLIC PANEL - Abnormal; Notable for the following components:   Glucose, Bld 124 (*)    Calcium 8.6 (*)    All other components within normal limits  I-STAT CHEM 8, ED - Abnormal; Notable for the following components:   Glucose, Bld 119 (*)    Calcium, Ion 1.04 (*)     All other components within normal limits  APTT  CBC  DIFFERENTIAL  RAPID URINE DRUG SCREEN, HOSP PERFORMED  TSH  HEMOGLOBIN A1C  LIPID PANEL  PROTIME-INR  I-STAT TROPONIN, ED  CBG MONITORING, ED    EKG EKG Interpretation  Date/Time:  Monday October 24 2017 11:16:48 EDT Ventricular Rate:  64 PR Interval:    QRS Duration: 82 QT Interval:  410 QTC Calculation: 422 R Axis:   125 Text Interpretation:  Atrial fibrillation Indeterminate axis Anterior infarct , age undetermined Abnormal ECG When comapred to prior,  now Afib.  No STEMI Confirmed by Antony Blackbird [phone]) on 10/24/2017 12:39:40 PM   Radiology Ct Head Wo Contrast  Result Date: 10/24/2017 CLINICAL DATA:  82 year old male with double vision upon waking. Initial encounter. EXAM: CT HEAD WITHOUT CONTRAST TECHNIQUE: Contiguous axial images were obtained from the base of the skull through the vertex without intravenous contrast. COMPARISON:  10/28/2010 CT. FINDINGS: Brain: No intracranial hemorrhage or CT evidence of large acute infarct. Remote small superior left cerebellar infarct. Chronic microvascular changes. Global atrophy. No intracranial mass lesion noted on this unenhanced exam. Vascular: Vascular calcifications.  No hyperdense vessel. Skull: Negative Sinuses/Orbits: No acute orbital abnormality. Post lens replacement. Polypoid opacification right sphenoid sinus. Previously sphenoid sinus was completely opacified. What remains may represent inspissated chronic sinusitis changes. Surrounding wall thickening consistent with chronic sinusitis. Minimal polypoid opacification roof of left maxillary sinus. Other: Mastoid air cells and middle ear cavities are clear. IMPRESSION: 1. No intracranial hemorrhage or CT evidence of large acute infarct. 2. Remote small superior left cerebellar infarct. 3. Chronic microvascular changes. 4. Global atrophy. 5. Polypoid opacification right sphenoid sinus suggestive of inspissated material from  changes of chronic sinusitis as noted above. Electronically Signed   By: Genia Del M.D.   On: 10/24/2017 12:18   Mr Jodene Nam Head Wo Contrast  Result Date: 10/24/2017 CLINICAL DATA:  Initial evaluation for acute diplopia. EXAM: MRI HEAD WITHOUT CONTRAST MRA HEAD WITHOUT CONTRAST TECHNIQUE: Multiplanar, multiecho pulse sequences of the brain and surrounding structures were obtained without intravenous contrast. Angiographic images of the head were obtained using MRA technique without contrast. COMPARISON:  Prior CT from earlier the same day. FINDINGS: MRI HEAD FINDINGS Brain: Diffuse prominence of the CSF containing spaces compatible with generalized age-related cerebral atrophy. Patchy and confluent T2/FLAIR hyperintensity within the periventricular deep white matter both cerebral  hemispheres, most consistent with chronic small vessel ischemic disease, moderate nature. Small remote left cerebellar infarct noted. No abnormal foci of restricted diffusion to suggest acute or subacute ischemia. Gray-white matter differentiation maintained. No encephalomalacia to suggest chronic cortical infarction. No acute or chronic intracranial hemorrhage. No mass lesion, midline shift or mass effect. No hydrocephalus. No extra-axial fluid collection. Normal pituitary gland. Vascular: Major intracranial vascular flow voids maintained. Skull and upper cervical spine: Craniocervical junction normal. Upper cervical spine normal. Bone marrow signal intensity within normal limits. No scalp soft tissue abnormality. Sinuses/Orbits: Globes and orbital soft tissues within normal limits. Patient status post ocular lens replacement bilaterally. Retention cyst noted within the right sphenoid and left maxillary sinuses. Paranasal sinuses are otherwise clear. No mastoid effusion. Inner ear structures normal. Other: None. MRA HEAD FINDINGS ANTERIOR CIRCULATION: Distal cervical segments of the internal carotid arteries are patent with antegrade  flow. Petrous, cavernous, and supraclinoid segments widely patent bilaterally. A1 segments widely patent. Normal anterior communicating artery. Anterior cerebral arteries mildly tortuous but widely patent to their distal aspects without stenosis. M1 segments widely patent bilaterally. Normal MCA bifurcations. No proximal M2 occlusion. Distal MCA branches well perfused and symmetric. POSTERIOR CIRCULATION: Vertebral arteries diminutive bilaterally, with the right slightly dominant. Vertebral arteries patent to the vertebrobasilar junction without stenosis. Posterior inferior cerebral arteries patent bilaterally. Basilar tortuous but widely patent to its distal aspect without stenosis. Superior cerebral arteries patent bilaterally. Fetal type origin of the PCAs supplied via widely patent posterior communicating arteries. PCAs widely patent to their distal aspects. No intracranial aneurysm. IMPRESSION: MRI HEAD IMPRESSION: 1. No acute intracranial infarct or other abnormality identified. 2. Age-related cerebral atrophy with moderate chronic small vessel ischemic disease. 3. Small remote left cerebellar infarct. MRA HEAD IMPRESSION: Negative intracranial MRA. No large vessel occlusion. No hemodynamically significant or correctable stenosis. Electronically Signed   By: Jeannine Boga M.D.   On: 10/24/2017 23:01   Mr Brain Wo Contrast  Result Date: 10/24/2017 CLINICAL DATA:  Initial evaluation for acute diplopia. EXAM: MRI HEAD WITHOUT CONTRAST MRA HEAD WITHOUT CONTRAST TECHNIQUE: Multiplanar, multiecho pulse sequences of the brain and surrounding structures were obtained without intravenous contrast. Angiographic images of the head were obtained using MRA technique without contrast. COMPARISON:  Prior CT from earlier the same day. FINDINGS: MRI HEAD FINDINGS Brain: Diffuse prominence of the CSF containing spaces compatible with generalized age-related cerebral atrophy. Patchy and confluent T2/FLAIR  hyperintensity within the periventricular deep white matter both cerebral hemispheres, most consistent with chronic small vessel ischemic disease, moderate nature. Small remote left cerebellar infarct noted. No abnormal foci of restricted diffusion to suggest acute or subacute ischemia. Gray-white matter differentiation maintained. No encephalomalacia to suggest chronic cortical infarction. No acute or chronic intracranial hemorrhage. No mass lesion, midline shift or mass effect. No hydrocephalus. No extra-axial fluid collection. Normal pituitary gland. Vascular: Major intracranial vascular flow voids maintained. Skull and upper cervical spine: Craniocervical junction normal. Upper cervical spine normal. Bone marrow signal intensity within normal limits. No scalp soft tissue abnormality. Sinuses/Orbits: Globes and orbital soft tissues within normal limits. Patient status post ocular lens replacement bilaterally. Retention cyst noted within the right sphenoid and left maxillary sinuses. Paranasal sinuses are otherwise clear. No mastoid effusion. Inner ear structures normal. Other: None. MRA HEAD FINDINGS ANTERIOR CIRCULATION: Distal cervical segments of the internal carotid arteries are patent with antegrade flow. Petrous, cavernous, and supraclinoid segments widely patent bilaterally. A1 segments widely patent. Normal anterior communicating artery. Anterior cerebral arteries mildly tortuous but widely  patent to their distal aspects without stenosis. M1 segments widely patent bilaterally. Normal MCA bifurcations. No proximal M2 occlusion. Distal MCA branches well perfused and symmetric. POSTERIOR CIRCULATION: Vertebral arteries diminutive bilaterally, with the right slightly dominant. Vertebral arteries patent to the vertebrobasilar junction without stenosis. Posterior inferior cerebral arteries patent bilaterally. Basilar tortuous but widely patent to its distal aspect without stenosis. Superior cerebral arteries  patent bilaterally. Fetal type origin of the PCAs supplied via widely patent posterior communicating arteries. PCAs widely patent to their distal aspects. No intracranial aneurysm. IMPRESSION: MRI HEAD IMPRESSION: 1. No acute intracranial infarct or other abnormality identified. 2. Age-related cerebral atrophy with moderate chronic small vessel ischemic disease. 3. Small remote left cerebellar infarct. MRA HEAD IMPRESSION: Negative intracranial MRA. No large vessel occlusion. No hemodynamically significant or correctable stenosis. Electronically Signed   By: Jeannine Boga M.D.   On: 10/24/2017 23:01    Procedures Procedures (including critical care time)  Medications Ordered in ED Medications  pantoprazole (PROTONIX) EC tablet 40 mg (40 mg Oral Given 10/24/17 1849)   stroke: mapping our early stages of recovery book (has no administration in time range)  0.9 %  sodium chloride infusion ( Intravenous New Bag/Given 10/24/17 1847)  acetaminophen (TYLENOL) tablet 650 mg (has no administration in time range)    Or  acetaminophen (TYLENOL) solution 650 mg (has no administration in time range)    Or  acetaminophen (TYLENOL) suppository 650 mg (has no administration in time range)  senna-docusate (Senokot-S) tablet 1 tablet (has no administration in time range)  atorvastatin (LIPITOR) tablet 80 mg (80 mg Oral Given 10/24/17 1845)  aspirin EC tablet 81 mg (has no administration in time range)  Warfarin - Pharmacist Dosing Inpatient (has no administration in time range)  warfarin (COUMADIN) tablet 5 mg (5 mg Oral Given 10/24/17 1845)     Initial Impression / Assessment and Plan / ED Course  I have reviewed the triage vital signs and the nursing notes.  Pertinent labs & imaging results that were available during my care of the patient were reviewed by me and considered in my medical decision making (see chart for details).     [name] is a 83 y.o. male with a past medical history  significant for atrial fibrillation on Coumadin therapy, prior carotid stenosis, hypertension, dyslipidemia, CAD, prior endocarditis, and prior mitral valve repair who presents from his ophthalmologist office for diplopia and concern for stroke.  Patient reports that over the last year he has had a proximally 4 episodes of brief double vision.  He said that always went away quickly.  He says that his last normal was at 10 PM last night and woke up this morning with double vision.  He reports that it is somewhat vertical and lateral diplopia.  He says that it is persistent and has not improved.  He reports some mild blurry vision as well.  He denies any headache or dizziness.  He denies any numbness, tingling, or weakness of extremities.  He went to his neurologist today who did a dilated eye exam and was concerned about stroke.  On exam, patient does have skewed vision.  When alternating between eye to eye, he does have a jump in his eyes.  Otherwise he had normal extraocular movements.  Pupils were very sluggish bilateral however he had a dilated eye exam at ophthalmologist and I suspect this is the cause.  Normal sensation strength in all extremities.  Normal visual fields in all areas.  faint  murmur was appreciated.  Chest nontender lungs clear.  Exam otherwise unremarkable.  Patient had a CT scan in triage that showed evidence of old infarct.  Clinically I am concerned patient had a stroke overnight.  I suspect the visual changes over the last year intermittently were possible TIAs.  He did not have a bruit on my exam however, neurology was called.  Neurology is concerned about stroke or vascular disease as well.  They recommended CTA head and neck as well as MRI.  They recommended admission for TIA versus stroke work-up.  Medicine team will be called for admission as recommended by neurology.   Final Clinical Impressions(s) / ED Diagnoses   Final diagnoses:  Diplopia     Clinical Impression: 1.  Diplopia     Disposition: Admit  This note was prepared with assistance of Dragon voice recognition software. Occasional wrong-word or sound-a-like substitutions may have occurred due to the inherent limitations of voice recognition software.      Iziah Cates, Gwenyth Allegra, MD 10/24/17 [phone]

## 2017-10-24 NOTE — Progress Notes (Signed)
Pt transported to MRI 

## 2017-10-24 NOTE — Progress Notes (Signed)
Echocardiogram in progress at bedside.

## 2017-10-24 NOTE — Progress Notes (Signed)
  Echocardiogram 2D Echocardiogram has been performed.  [name] 10/24/2017, 5:41 PM

## 2017-10-24 NOTE — Progress Notes (Signed)
RN states patient said he does not want to wear CPAP tonight.

## 2017-10-24 NOTE — Progress Notes (Signed)
ANTICOAGULATION CONSULT NOTE - Initial Consult  Pharmacy Consult for warfarin Indication: atrial fibrillation  Allergies  Allergen Reactions  . Flomax [Tamsulosin Hcl] Other (See Comments)    Makes him "swimmy headed"    Patient Measurements: Height: 5\' 11"  (180.3 cm) Weight: 238 lb (108 kg) IBW/kg (Calculated) : 75.3 Heparin Dosing Weight: 98.3  Vital Signs: Temp: 97.8 F (36.6 C) (09/09 1229) Temp Source: Oral (09/09 1213) BP: 153/80 (09/09 1500) Pulse Rate: 59 (09/09 1500)  Labs: Recent Labs    10/24/17 1123 10/24/17 1144  HGB 15.9 15.6  HCT 47.2 46.0  PLT 225  --   APTT 36  --   LABPROT 24.4*  --   INR 2.21  --   CREATININE 0.78 0.70    Estimated Creatinine Clearance: 92.1 mL/min (by C-G formula based on SCr of 0.7 mg/dL).   Medical History: Past Medical History:  Diagnosis Date  . Allergy   . Arthritis    knees & hips  . Atrial fibrillation ([place])    on Coumadin  . BPH (benign prostatic hyperplasia)   . Coronary artery disease excluded   . Diverticulosis   . Endocarditis, valve unspecified, unspecified cause   . GERD (gastroesophageal reflux disease)   . H/O mitral valve repair    Postoperative ring with postoperative atrial fibrillation  . Hiatal hernia 06/13/2002  . History of kidney stones    passed spontaneously x2   . OSA on CPAP   . Unspecified essential hypertension   . Weakness of both arms 07/19/2012    Medications:   Assessment: 82yo M on chronic anticoagulation for Afib with warfarin PTA being admitted for diplopia possibly secondary to stroke. Last dose of warfarin was 9/8. INR is therapeutic at 2.21 on admission.  PTA warfarin regimen: 5mg  daily, except 2.5mg  on Tuesdays  Goal of Therapy:  INR 2-3 Monitor platelets by anticoagulation protocol: Yes   Plan:  Warfarin 5mg  x1 Daily INR, monitor s/s bleeding  Harrietta Guardian, PharmD PGY1 Pharmacy Resident 10/24/2017    3:45 PM

## 2017-10-24 NOTE — H&P (Signed)
History and Physical    [name]:[id number] DOB: 1935-08-31 DOA: 10/24/2017  PCP: Dione Housekeeper, MD Consultants:  Percival Spanish - cardiology; Claiborne Billings - sleep doctor; Manuella Ghazi - eye Patient coming from:  Home - lives with wife; NOK: Wife, [phone]  Chief Complaint: Diplopia  HPI: [name] is a 82 y.o. male with medical history significant of HTN; GERD; afib on Coumadin; HLD; CAD; s/p MVR; and BPH presenting with diplopia.  He woke up this AM with double vision.  He is still noticing it, but it appears to be getting some better.  He has had brief episodes of this in the past, 1-2 minutes at a time.  He has had maybe 3-4 of those episodes in the last 12 months.  This time, it has been constant.  Occasional food dysphagia.  No dysarthria.  No N/W/T of arms/legs today.  Ophthalmology exam showed "Maddox rod testing consistent with skew deviation which would localize to possible brain stem or cerebellum pathology."  Dr. Manuella Ghazi recommends MRI with brain and orbit and also a myasthenia gravis panel.   ED Course:  Probably had a stroke.  H/o carotid stenosis (mild), no h/o CVA.  4 episodes of transient diplopia x 1 year.  Today, saw ophthalmology for diplopia.  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  Stroke or TIA, needs evaluation as per neuro.  Review of Systems: As per HPI; otherwise review of systems reviewed and negative.   Ambulatory Status:  Ambulates without assistance  Past Medical History:  Diagnosis Date  . Allergy   . Arthritis    knees & hips  . Atrial fibrillation ([place])    on Coumadin  . BPH (benign prostatic hyperplasia)   . Coronary artery disease excluded   . Diverticulosis   . Endocarditis, valve unspecified, unspecified cause   . GERD (gastroesophageal reflux disease)   . H/O mitral valve repair    Postoperative ring with postoperative atrial fibrillation  . Hiatal hernia 06/13/2002  . History of kidney stones    passed spontaneously x2   . OSA on CPAP   .  Unspecified essential hypertension   . Weakness of both arms 07/19/2012    Past Surgical History:  Procedure Laterality Date  . CARDIOVERSION N/A 04/14/2016   Procedure: CARDIOVERSION;  Surgeon: Minus Breeding, MD;  Location: [facility] ENDOSCOPY;  Service: Cardiovascular;  Laterality: N/A;  . CATARACT EXTRACTION W/PHACO Right 05/12/2015   Procedure: CATARACT EXTRACTION PHACO AND INTRAOCULAR LENS PLACEMENT RIGHT EYE CDE=7.75;  Surgeon: Tonny Branch, MD;  Location: [facility] ORS;  Service: Ophthalmology;  Laterality: Right;  . CATARACT EXTRACTION W/PHACO Left 06/12/2015   Procedure: CATARACT EXTRACTION PHACO AND INTRAOCULAR LENS PLACEMENT (IOC);  Surgeon: Tonny Branch, MD;  Location: [facility] ORS;  Service: Ophthalmology;  Laterality: Left;  CDE:  13.17  . CHOLECYSTECTOMY    . INJECTION KNEE Right 02/20/2016   Procedure: KNEE INJECTION;  Surgeon: [name], MD;  Location: [place];  Service: Orthopedics;  Laterality: Right;  . KNEE SURGERY Left   . MITRAL VALVE REPAIR    . MV repair     2003  . ROTATOR CUFF REPAIR Left   . TOTAL KNEE ARTHROPLASTY Left 02/20/2016   Procedure: LEFT TOTAL KNEE ARTHROPLASTY WITH RIGHT KNEE INJECTION;  Surgeon: [name], MD;  Location: [place];  Service: Orthopedics;  Laterality: Left;  Marland Kitchen VASECTOMY      Social History   Socioeconomic History  . Marital status: Married    Spouse name: GLORIA  . Number  of children: 2  . Years of education: Not on file  . Highest education level: Not on file  Occupational History  . Occupation:  Environmental manager    Comment: RETIRED  Social Needs  . Financial resource strain: Not on file  . Food insecurity:    Worry: Not on file    Inability: Not on file  . Transportation needs:    Medical: Not on file    Non-medical: Not on file  Tobacco Use  . Smoking status: Former Smoker    Packs/day: 3.00    Years: 35.00    Pack years: 105.00    Types: Cigarettes    Last attempt to quit: 02/16/1988    Years since quitting: 29.7  . Smokeless tobacco:  Never Used  . Tobacco comment:  Year Quit: 1990   Substance and Sexual Activity  . Alcohol use: No  . Drug use: No  . Sexual activity: Not on file  Lifestyle  . Physical activity:    Days per week: Not on file    Minutes per session: Not on file  . Stress: Not on file  Relationships  . Social connections:    Talks on phone: Not on file    Gets together: Not on file    Attends religious service: Not on file    Active member of club or organization: Not on file    Attends meetings of clubs or organizations: Not on file    Relationship status: Not on file  . Intimate partner violence:    Fear of current or ex partner: Not on file    Emotionally abused: Not on file    Physically abused: Not on file    Forced sexual activity: Not on file  Other Topics Concern  . Not on file  Social History Narrative  . Not on file    Allergies  Allergen Reactions  . Flomax [Tamsulosin Hcl] Other (See Comments)    Makes him "swimmy headed"    Family History  Problem Relation Age of Onset  . Congestive Heart Failure Father   . Heart disease Father   . Stroke Brother   . Stroke Sister   . Diabetes Mellitus II Brother   . Diabetes Mellitus II Sister   . Lung cancer Brother   . Colon cancer Neg Hx   . Esophageal cancer Neg Hx   . Rectal cancer Neg Hx   . Stomach cancer Neg Hx     Prior to Admission medications   Medication Sig Start Date End Date Taking? Authorizing Provider  acetaminophen (TYLENOL) 500 MG tablet Take 500 mg by mouth as needed for mild pain.    Yes [provider]  amLODipine (NORVASC) 2.5 MG tablet Take 2.5 mg by mouth daily before breakfast.  03/13/12  Yes [provider]  Coenzyme Q10 200 MG capsule Take 200 mg by mouth 2 (two) times daily.   Yes [provider]  lisinopril (PRINIVIL,ZESTRIL) 20 MG tablet TAKE 1 TABLET (20 MG TOTAL) BY MOUTH DAILY BEFORE BREAKFAST. Patient taking differently: Take 20 mg by mouth daily.  09/06/17  Yes  Minus Breeding, MD  MULTIPLE VITAMIN PO Take 1 tablet by mouth daily.   Yes [provider]  omeprazole (PRILOSEC) 20 MG capsule Take 20 mg by mouth daily before breakfast.    Yes [provider]  vitamin C (ASCORBIC ACID) 500 MG tablet Take 500 mg by mouth daily.   Yes [provider]  warfarin (COUMADIN) 5  MG tablet TAKE 1 TABLET BY MOUTH DAILY OR AS DIRECTED BY COUMADIN CLINIC Patient taking differently: Take 2.5-5 mg by mouth See admin instructions. Take one tablet by mouth everyday except on Tuesdays take half tablet. 04/11/17  Yes Minus Breeding, MD    Physical Exam: Vitals:   10/24/17 1415 10/24/17 1430 10/24/17 1445 10/24/17 1500  BP: 129/76 115/61 120/77 (!) 153/80  Pulse: (!) 52 60 (!) 53 (!) 59  Resp: 17 19 15 17   Temp:      TempSrc:      SpO2: 96% 93% 97% 92%  Weight:      Height:         General:  Appears calm and comfortable and is NAD Eyes:  PERRL, EOMI, normal lids, iris ENT:  grossly normal hearing, lips & tongue, mmm; appropriate dentition Neck:  no LAD, masses or thyromegaly; no carotid bruits Cardiovascular:  Irregularly irregular, no m/r/g. No LE edema.  Respiratory:   CTA bilaterally with no wheezes/rales/rhonchi.  Normal respiratory effort. Abdomen:  soft, NT, ND, NABS Back:   normal alignment, no CVAT Skin:  no rash or induration seen on limited exam Musculoskeletal:  grossly normal tone BUE/BLE, good ROM, no bony abnormality Psychiatric:  grossly normal mood and affect, speech fluent and appropriate, AOx3 Neurologic:  CN 2-12 grossly intact, moves all extremities in coordinated fashion, sensation intact    Radiological Exams on Admission: Ct Head Wo Contrast  Result Date: 10/24/2017 CLINICAL DATA:  82 year old male with double vision upon waking. Initial encounter. EXAM: CT HEAD WITHOUT CONTRAST TECHNIQUE: Contiguous axial images were obtained from the base of the skull through the vertex without intravenous contrast.  COMPARISON:  10/28/2010 CT. FINDINGS: Brain: No intracranial hemorrhage or CT evidence of large acute infarct. Remote small superior left cerebellar infarct. Chronic microvascular changes. Global atrophy. No intracranial mass lesion noted on this unenhanced exam. Vascular: Vascular calcifications.  No hyperdense vessel. Skull: Negative Sinuses/Orbits: No acute orbital abnormality. Post lens replacement. Polypoid opacification right sphenoid sinus. Previously sphenoid sinus was completely opacified. What remains may represent inspissated chronic sinusitis changes. Surrounding wall thickening consistent with chronic sinusitis. Minimal polypoid opacification roof of left maxillary sinus. Other: Mastoid air cells and middle ear cavities are clear. IMPRESSION: 1. No intracranial hemorrhage or CT evidence of large acute infarct. 2. Remote small superior left cerebellar infarct. 3. Chronic microvascular changes. 4. Global atrophy. 5. Polypoid opacification right sphenoid sinus suggestive of inspissated material from changes of chronic sinusitis as noted above. Electronically Signed   By: Genia Del M.D.   On: 10/24/2017 12:18    EKG: Independently reviewed.  Afib with rate 64; nonspecific ST changes with no evidence of acute ischemia   Labs on Admission: I have personally reviewed the available labs and imaging studies at the time of the admission.  Pertinent labs:   Glucose 124 CMP otherwise WNL Troponin 0.02 Normal CBC INR 2.21   Assessment/Plan Principal Problem:   Diplopia Active Problems:   Essential hypertension   ATRIAL FIBRILLATION   Sleep apnea   Dyslipidemia   Carotid stenosis   Diplopia -Concerning for posterior circulation CVA -Will place in observation status for CVA/TIA evaluation -Telemetry monitoring -MRI/MRA -Echo -Risk stratification with FLP, A1c; will also check TSH and UDS -ASA daily -Neurology consult -PT/OT/ST/Nutrition Consults  HTN -Allow permissive HTN for  now -Treat BP only if >220/120, and then with goal of 15% reduction -Hold ACE and CCB and plan to restart in 48-72 hours   HLD -Check FLP -Start  empiric Lipitor at 80 mg daily for now   Afib on Coumadin -INR is at goal of 2-3 -Continue Coumadin with pharmacy assistance  OSA -Continue CPAP  Carotid stenosis -B mild plaque <49% in 8/15 -Repeat carotid dopplers     DVT prophylaxis:  Coumadin Code Status: Full - confirmed with patient/family Family Communication: Wife present throughout evaluation Disposition Plan:  Home once clinically improved Consults called: Neurology; PT/OT/ST/Nutrition  Admission status: It is my clinical opinion that referral for OBSERVATION is reasonable and necessary in this patient based on the above information provided. The aforementioned taken together are felt to place the patient at high risk for further clinical deterioration. However it is anticipated that the patient may be medically stable for discharge from the hospital within 24 to 48 hours.    Karmen Bongo MD [facility]  If note is complete, please contact covering daytime or nighttime physician. [email or web address] Password [facility]  10/24/2017, 3:43 PM

## 2017-10-24 NOTE — ED Notes (Signed)
Pt. Asked why the monitor kept beeping. When I turned to speak to pt. He said he was seeing two of me. One at the door and one slightly in the distance behind me.

## 2017-10-24 NOTE — Consult Note (Addendum)
Reason for Consult: Diplopia Referring Physician: Tegeler  CC: Diplopia  History is obtained from: Patient and wife  HPI: [name] is a 82 y.o. male with history of arthritis the knees, coronary artery disease, endocarditis with MVR  history of mitral valve repair, HTN and prediabetes.  Patient went to bed at 10:00 last night having no symptoms awoke this morning and noted that he was seeing double especially when looking to the left. Describes images being one on top of the other but not exactly on top of each other, slightly askew.  For that reason he went to his ophthalmologist at that time his ophthalmologist dilated his eyes and also did further diagnostic test which did show disconjugate gaze.  Patient was sent to the emergency department for further evaluation and MRI.   Patient is on chronic Coumadin due to atrial fibrillation.  Apparently per wife and patient he cannot be on any of the novel anticoagulant secondary to his mitral valve repair.  LKW: 2200 hrs. on 10/23/2017 tpa given?: no, out of the window Premorbid modified Rankin scale (mRS): 0 NIH stroke scale 1 for vision  ROS:ROS was performed and is negative except as noted in the HPI.  Past Medical History:  Diagnosis Date  . Allergy   . Arthritis    knees & hips  . BPH (benign prostatic hyperplasia)   . Coronary artery disease excluded   . Diverticulosis   . Endocarditis, valve unspecified, unspecified cause   . GERD (gastroesophageal reflux disease)   . H/O mitral valve repair    Postoperative ring with postoperative atrial fibrillation  . Hiatal hernia 06/13/2002  . History of kidney stones    passed spontaneously x2   . Unspecified essential hypertension   . Weakness of both arms 07/19/2012   Family History  Problem Relation Age of Onset  . Congestive Heart Failure Father   . Heart disease Father   . Stroke Brother   . Stroke Sister   . Diabetes Mellitus II Brother   . Diabetes Mellitus II Sister   .  Lung cancer Brother   . Colon cancer Neg Hx   . Esophageal cancer Neg Hx   . Rectal cancer Neg Hx   . Stomach cancer Neg Hx    Social History:   reports that he quit smoking about 29 years ago. His smoking use included cigarettes. He has a 105.00 pack-year smoking history. He has never used smokeless tobacco. He reports that he does not drink alcohol or use drugs.  Medications No current facility-administered medications for this encounter.   Current Outpatient Medications:  .  acetaminophen (TYLENOL) 500 MG tablet, Take 500 mg by mouth as needed., Disp: , Rfl:  .  amLODipine (NORVASC) 2.5 MG tablet, Take 2.5 mg by mouth daily before breakfast. , Disp: , Rfl:  .  Coenzyme Q10 200 MG capsule, Take 200 mg by mouth 2 (two) times daily., Disp: , Rfl:  .  lisinopril (PRINIVIL,ZESTRIL) 20 MG tablet, TAKE 1 TABLET (20 MG TOTAL) BY MOUTH DAILY BEFORE BREAKFAST., Disp: 30 tablet, Rfl: 5 .  MULTIPLE VITAMIN PO, Take 1 tablet by mouth daily., Disp: , Rfl:  .  omeprazole (PRILOSEC) 20 MG capsule, Take 20 mg by mouth daily before breakfast. , Disp: , Rfl:  .  vitamin C (ASCORBIC ACID) 500 MG tablet, Take 500 mg by mouth daily., Disp: , Rfl:  .  warfarin (COUMADIN) 5 MG tablet, TAKE 1 TABLET BY MOUTH DAILY OR AS DIRECTED BY COUMADIN  CLINIC, Disp: 90 tablet, Rfl: 1  Exam: Current vital signs: BP (!) 143/98   Pulse 73   Temp 98 F (36.7 C) (Oral)   Resp (!) 21   Ht 5\' 11"  (1.803 m)   Wt 108 kg   SpO2 96%   BMI 33.19 kg/m  Vital signs in last 24 hours: Temp:  [97.8 F (36.6 C)-98 F (36.7 C)] 98 F (36.7 C) (09/09 1213) Pulse Rate:  [46-89] 73 (09/09 1315) Resp:  [15-21] 21 (09/09 1315) BP: (117-143)/(74-98) 143/98 (09/09 1300) SpO2:  [94 %-99 %] 96 % (09/09 1315) Weight:  [673 kg] 108 kg (09/09 1230)  GENERAL: Awake, alert in NAD HEENT: - Normocephalic and atraumatic, dry mm,  LUNGS - Clear to auscultation bilaterally with no wheezes CV - S1S2 RRR, no m/r/g, equal pulses  bilaterally. ABDOMEN - Soft, nontender, nondistended with normoactive BS Ext: warm, well perfused, intact peripheral pulses,   NEURO:  Mental Status: AA&Ox3, speech is clear.  Naming, repetition, fluency, and comprehension intact. Cranial Nerves: PERRL 5 mm dilated and very minimal constriction secondary to dilation at the ophthalmologist office. EOMI shows slightly disconjugate gaze with lag in the left eye to bury in the left lateral canthus, +diplopia in all directions, worse on left gaze. visual fields full, no facial asymmetry, facial sensation intact, auditory acuity grossly intact. Motor: 5/5 all over with no vertical drift. Tone: is normal and bulk is normal Sensation- Intact to light touch bilaterally Coordination: FTN intact bilaterally, no ataxia in BLE.  Labs I have reviewed labs in epic and the results pertinent to this consultation are: CBC    Component Value Date/Time   WBC 6.9 10/24/2017 1123   RBC 4.98 10/24/2017 1123   HGB 15.6 10/24/2017 1144   HGB 15.2 04/08/2016 1037   HCT 46.0 10/24/2017 1144   HCT 45.0 04/08/2016 1037   PLT 225 10/24/2017 1123   PLT 309 04/08/2016 1037   MCV 94.8 10/24/2017 1123   MCV 93 04/08/2016 1037   MCH 31.9 10/24/2017 1123   MCHC 33.7 10/24/2017 1123   RDW 12.9 10/24/2017 1123   RDW 13.4 04/08/2016 1037   LYMPHSABS 1.7 10/24/2017 1123   LYMPHSABS 1.7 04/08/2016 1037   MONOABS 0.6 10/24/2017 1123   EOSABS 0.1 10/24/2017 1123   EOSABS 0.1 04/08/2016 1037   BASOSABS 0.0 10/24/2017 1123   BASOSABS 0.0 04/08/2016 1037   CMP     Component Value Date/Time   NA 141 10/24/2017 1144   NA 141 04/08/2016 1037   K 4.1 10/24/2017 1144   CL 103 10/24/2017 1144   CO2 28 10/24/2017 1123   GLUCOSE 119 (H) 10/24/2017 1144   BUN 17 10/24/2017 1144   BUN 15 04/08/2016 1037   CREATININE 0.70 10/24/2017 1144   CALCIUM 8.6 (L) 10/24/2017 1123   PROT 7.2 10/24/2017 1123   ALBUMIN 4.1 10/24/2017 1123   AST 22 10/24/2017 1123   ALT 18  10/24/2017 1123   ALKPHOS 68 10/24/2017 1123   BILITOT 0.9 10/24/2017 1123   GFRNONAA >60 10/24/2017 1123   GFRAA >60 10/24/2017 1123   Attending addendum Patient seen and examined I have independently reviewed the chart and imaging.  Imaging I have reviewed the images obtained: CT-scan of the brain-no intracranial hemorrhage on CT or evidence of acute infarct.  There is a remote small superior left cerebellar infarct.  The global atrophy.  MRI examination of the brain has been ordered with thin cuts of the brainstem and cerebellum to evaluate for  possible stroke causing his diplopia  Assessment:  82 year old man with history of arthritis, coronary disease, mitral valve repair atrial fibrillation on Coumadin, with no history of strokes, presents for sudden onset of diplopia. He was out of bed ophthalmologist and found to have no obvious exclamation for the diplopia in the eye and given sudden onset of his diplopia and disconjugate gaze along with chronic A. fib and on Coumadin, Suspect he has had a stroke in either the brainstem or cerebellum.    Impression: -Diplopia -Chronic A. fib on Coumadin -possible stroke in brain stem and or cerebellum  Recommend # MRI of the brain without contrast but with thin cuts of the cerebellum and brainstem #MRA Head   #Transthoracic Echo,  # continue coumadin for now #Start or continue Atorvastatin 80 mg/other high intensity statin if LDL >70 # BP goal: permissive HTN upto 220/120 mmHg # HBAIC and Lipid profile # Telemetry monitoring # Frequent neuro checks # NPO until passes stroke swallow screen  Please page stroke NP  Or  PA  Or MD from 8am -4 pm  as this patient from this time will be  followed by the stroke.   You can look them up on [email or web address]  Password [email or web address]  -- Amie Portland, MD [facility] Pager: [phone] If 7pm to 7am, please call on call as listed on AMION.

## 2017-10-25 ENCOUNTER — Other Ambulatory Visit: Payer: Self-pay | Admitting: Cardiology

## 2017-10-25 ENCOUNTER — Observation Stay (HOSPITAL_BASED_OUTPATIENT_CLINIC_OR_DEPARTMENT_OTHER): Payer: Medicare Other

## 2017-10-25 ENCOUNTER — Encounter (HOSPITAL_COMMUNITY): Payer: Self-pay | Admitting: Internal Medicine

## 2017-10-25 DIAGNOSIS — E1140 Type 2 diabetes mellitus with diabetic neuropathy, unspecified: Secondary | ICD-10-CM

## 2017-10-25 DIAGNOSIS — E119 Type 2 diabetes mellitus without complications: Secondary | ICD-10-CM

## 2017-10-25 DIAGNOSIS — G459 Transient cerebral ischemic attack, unspecified: Secondary | ICD-10-CM

## 2017-10-25 DIAGNOSIS — I639 Cerebral infarction, unspecified: Secondary | ICD-10-CM | POA: Diagnosis not present

## 2017-10-25 LAB — LIPID PANEL
Cholesterol: 162 mg/dL (ref 0–200)
HDL: 36 mg/dL — ABNORMAL LOW (ref >40)
LDL CALC: 89 mg/dL (ref 0–99)
TRIGLYCERIDES: 186 mg/dL — AB (ref <150)
Total CHOL/HDL Ratio: 4.5 RATIO
VLDL: 37 mg/dL (ref 0–40)

## 2017-10-25 LAB — HEMOGLOBIN A1C
HEMOGLOBIN A1C: 6.4 % — AB (ref 4.8–5.6)
Mean Plasma Glucose: 136.98 mg/dL

## 2017-10-25 LAB — GLUCOSE, CAPILLARY
GLUCOSE-CAPILLARY: 159 mg/dL — AB (ref 70–99)
GLUCOSE-CAPILLARY: 104 mg/dL — AB (ref 70–99)

## 2017-10-25 LAB — PROTIME-INR
INR: 2.13
PROTHROMBIN TIME: 23.7 s — AB (ref 11.4–15.2)

## 2017-10-25 MED ORDER — INSULIN ASPART 100 UNIT/ML ~~LOC~~ SOLN: 0.0000 [IU] | Freq: Three times a day (TID) | SUBCUTANEOUS | Status: DC

## 2017-10-25 MED ORDER — ATORVASTATIN CALCIUM 10 MG PO TABS: 10.0000 mg | ORAL_TABLET | Freq: Every day | ORAL | 1 refills | Status: DC

## 2017-10-25 MED ORDER — WARFARIN SODIUM 5 MG PO TABS: 5.0000 mg | ORAL_TABLET | Freq: Once | ORAL | Status: DC

## 2017-10-25 MED ORDER — ATORVASTATIN CALCIUM 10 MG PO TABS: 10.0000 mg | ORAL_TABLET | Freq: Every day | ORAL | Status: DC

## 2017-10-25 MED ORDER — LIVING WELL WITH DIABETES BOOK
Freq: Once | Status: AC
  Administered 2017-10-25: 11:00:00
  Filled 2017-10-25: qty 1

## 2017-10-25 NOTE — Progress Notes (Signed)
Discussed follow up and discharge instructions at length with patient , wife and son. All questions answered. Medication start times and doses discussed and written instructiions given. Pt states undestanding and steay gait. With stable vs.

## 2017-10-25 NOTE — Evaluation (Signed)
Speech Language Pathology Evaluation Patient Details Name: [name] MRN: [id number] DOB: 11/10/1935 Today's Date: 10/25/2017 Time: 6789-3810 SLP Time Calculation (min) (ACUTE ONLY): 31 min  Problem List:  Patient Active Problem List   Diagnosis Date Noted  . Type 2 diabetes mellitus without complication ([place]) 17/51/0258  . Diplopia 10/24/2017  . S/P total knee arthroplasty, left 04/29/2016  . Monitoring for long-term anticoagulant use 03/15/2016  . Weakness of both arms 07/19/2012  . Colon cancer screening 06/22/2012  . Unspecified venous (peripheral) insufficiency 05/05/2012  . Chronic venous insufficiency 04/07/2012  . Other malaise and fatigue 03/16/2012  . Disturbance of skin sensation 03/16/2012  . Carotid stenosis 08/16/2011  . Obesity 08/16/2011  . Coronary artery disease excluded   . H/O mitral valve repair   . Endocarditis, valve unspecified, unspecified cause   . Dizziness 12/16/2010  . Dyslipidemia 09/23/2010  . Essential hypertension 04/06/2007  . ATRIAL FIBRILLATION 04/06/2007  . Sleep apnea 04/06/2007  . DYSPNEA 04/06/2007   Past Medical History:  Past Medical History:  Diagnosis Date  . Arthritis    knees, shoulder,  hips (10/24/2017)  . Atrial fibrillation ([place])    on Coumadin  . BPH (benign prostatic hyperplasia)   . Coronary artery disease excluded   . Diverticulosis   . Endocarditis, valve unspecified, unspecified cause   . GERD (gastroesophageal reflux disease)   . H/O mitral valve repair    Postoperative ring with postoperative atrial fibrillation  . Heart murmur    hx  . Hiatal hernia 06/13/2002  . High cholesterol   . History of kidney stones    passed spontaneously x2   . OSA on CPAP   . Unspecified essential hypertension   . Weakness of both arms 07/19/2012   Past Surgical History:  Past Surgical History:  Procedure Laterality Date  . CARDIAC CATHETERIZATION  X 2  . CARDIOVERSION N/A 04/14/2016   Procedure: CARDIOVERSION;   Surgeon: Minus Breeding, MD;  Location: [facility] ENDOSCOPY;  Service: Cardiovascular;  Laterality: N/A;  . CATARACT EXTRACTION W/PHACO Right 05/12/2015   Procedure: CATARACT EXTRACTION PHACO AND INTRAOCULAR LENS PLACEMENT RIGHT EYE CDE=7.75;  Surgeon: Tonny Branch, MD;  Location: [facility];  Service: Ophthalmology;  Laterality: Right;  . CATARACT EXTRACTION W/PHACO Left 06/12/2015   Procedure: CATARACT EXTRACTION PHACO AND INTRAOCULAR LENS PLACEMENT (IOC);  Surgeon: Tonny Branch, MD;  Location: [facility];  Service: Ophthalmology;  Laterality: Left;  CDE:  13.17  . INJECTION KNEE Right 02/20/2016   Procedure: KNEE INJECTION;  Surgeon: [name], MD;  Location: [facility];  Service: Orthopedics;  Laterality: Right;  . JOINT REPLACEMENT    . KNEE ARTHROSCOPY Left   . LAPAROSCOPIC CHOLECYSTECTOMY    . MITRAL VALVE REPAIR  ~ 2003  . SHOULDER OPEN ROTATOR CUFF REPAIR Left   . TOTAL KNEE ARTHROPLASTY Left 02/20/2016   Procedure: LEFT TOTAL KNEE ARTHROPLASTY WITH RIGHT KNEE INJECTION;  Surgeon: [name], MD;  Location: [facility];  Service: Orthopedics;  Laterality: Left;  Marland Kitchen VASECTOMY     HPI:  [name] is a 82 y.o. male with medical history significant of HTN; GERD; afib on Coumadin; HLD; CAD; s/p MVR; and BPH presenting with diplopia. Per chart, he woke up yesterday with double vision and reported reccurent brief episodes of this in the past lasting 1-2 minutes (estimated 3-4 of diplopia episodes in last 12 months). Also per chart, pt reported hx of dysphagia. MRI revealed small remote cellebellar infarct; no acute abnormalities.   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 Primary deficits mild in nature emerged during timed generative naming tasks and abstract thinking. Short term  memory, working memory, Comptroller, processing speed appeared [facility] during functional assessment given no-min verbal cues from clinician. He demonstrated intact intellectual awareness, provided details regarding current hospital stay and plan for monitoring his status upon returning home. Pt presented with mild garbled/mumbled speech and hoarse vocal quality, which he reported to be consistent with baseline. SLP educated pt regarding stoke symptoms and reinforced recommendations to follow if he experiences symptoms again, which he eagerly accepted and verbalized understanding. Pt is independent at home, lives with wife and presents with cognitive/linguistic skills which appear to be functional for activities of daily living. No follow up with ST recommended.     SLP Assessment  SLP Recommendation/Assessment: Patient does not need any further Speech Lanaguage Pathology Services SLP Visit Diagnosis: Cognitive communication deficit (R41.841)    Follow Up Recommendations  None    Frequency and Duration           SLP Evaluation Cognition  Overall Cognitive Status: No family/caregiver present to determine baseline cognitive functioning(suspect he is at baseline) Arousal/Alertness: Awake/alert Orientation Level: Oriented X4 Attention: Focused;Sustained Focused Attention: Appears intact Sustained Attention: Appears intact Memory: Appears intact Awareness: Appears intact Problem Solving: Appears intact Executive Function: Organizing;Self Monitoring;Self Correcting Organizing: Impaired Organizing Impairment: Verbal basic Behaviors: Other (comment)(pleasant, cooperative) Safety/Judgment: Appears intact       Comprehension  Auditory Comprehension Overall Auditory Comprehension: Appears within functional limits for tasks assessed Yes/No Questions: Not tested Commands: Not tested(WFL informal assessment) Conversation: Simple Interfering Components: Visual impairments(episodes of  diplopia) EffectiveTechniques: Repetition;Pausing Visual Recognition/Discrimination Discrimination: Not tested Reading Comprehension Reading Status: Not tested    Expression Expression Primary Mode of Expression: Verbal Verbal Expression Overall Verbal Expression: Appears within functional limits for tasks assessed Initiation: No impairment Level of Generative/Spontaneous Verbalization: Conversation;Word;Sentence Repetition: No impairment Naming: Impairment Confrontation: Not tested Convergent: Not tested Divergent: 75-100% accurate Pragmatics: No impairment Written Expression Dominant Hand: Right Written Expression: Not tested   Oral / Motor  Oral Motor/Sensory Function Overall Oral Motor/Sensory Function: Other (comment)(WFL during informal obsevation) Motor Speech Overall Motor Speech: Impaired at baseline(slightly garbled/mumbled speech at baseline) Respiration: Within functional limits Phonation: Hoarse(mild) Resonance: Within functional limits Articulation: Impaired Level of Impairment: Conversation Intelligibility: Intelligibility reduced(80-90% intell) Word: 75-100% accurate Phrase: 75-100% accurate Sentence: 75-100% accurate Conversation: 75-100% accurate Motor Planning: Witnin functional limits   Jettie Booze, Student SLP                     Jettie Booze 10/25/2017, 10:43 AM

## 2017-10-25 NOTE — Progress Notes (Signed)
Pt off floor for carotid study.

## 2017-10-25 NOTE — Care Management Note (Addendum)
Case Management Note  Patient Details  Name: [name] MRN: [id number] Date of Birth: Jan 04, 1936  Subjective/Objective:  82 y.o. male with medical history significant of HTN; GERD; afib on Coumadin; HLD; CAD; s/p MVR; and BPH presenting with diplopia.                 Action/Plan: CM consult acknowledge for a benefits check to determine insurance coverage for a NOAC, with the requested benefits check pending at this time. PT/OT consults pending, with CM to continue to follow for post hospital recommendations and/or transitional needs.   Expected Discharge Date:                  Expected Discharge Plan:  Home/Self Care  In-House Referral:  NA  Discharge planning Services  CM Consult, Medication Assistance(Benefits check )  Post Acute Care Choice:  NA Choice offered to:  NA  DME Arranged:  N/A DME Agency:  NA  HH Arranged:  NA HH Agency:  NA  Status of Service:  In process, will continue to follow  If discussed at Long Length of Stay Meetings, dates discussed:    Additional Comments:  Midge Minium RN, BSN, NCM-BC, ACM-RN [phone] 10/25/2017, 11:45 AM

## 2017-10-25 NOTE — Care Management (Signed)
10-25-17  BENEFITS CHECK :  # 9.  S/W   [facility]  @ [facility] [employer] # [phone]  1. ELIQUIS  5 MG BID COVER-YES CO-PAY- $ 45.00 TIER- 3 DRUG PRIOR APPROVAL- NO  2. PRADAXA  75 MG BID COVER- YES CO-PAY- $ 145.00 TIER- 4 DRUG PRIOR APPROVAL- NO  3. XARELTO  20 MG DAILY COVER- YES CO-PAY- $ 45.00 TIER- 3 DRUG PRIOR APPROVAL- NO  DEDUCTIBLE : NOT MET  PREFERRED PHARMACY : YES   [facility]

## 2017-10-25 NOTE — Progress Notes (Signed)
*  Preliminary Results* Carotid artery duplex has been completed. Bilateral internal carotid arteries are 1-39%. Vertebral arteries are patent with antegrade flow.  10/25/2017 10:25 AM  [name] Dawna Part

## 2017-10-25 NOTE — Evaluation (Signed)
Physical Therapy Evaluation Patient Details Name: [name] MRN: [id number] DOB: 1935-05-10 Today's Date: 10/25/2017   History of Present Illness  [name] is a 82 y.o. male with medical history significant of HTN; GERD; afib on Coumadin; HLD; CAD; s/p MVR; and BPH presenting with diplopia.  Clinical Impression  Patient presents close to functional baseline.  Some weaving with head turns with ambulation and mild LOB turning on stairs, but feel will resolve without follow up PT. Did educate on stroke warning signs and risk facgtors.  No further skilled PT needs.  Will sign off.     Follow Up Recommendations No PT follow up    Equipment Recommendations  None recommended by PT    Recommendations for Other Services       Precautions / Restrictions Precautions Precautions: None      Mobility  Bed Mobility Overal bed mobility: Independent                Transfers Overall transfer level: Independent                  Ambulation/Gait Ambulation/Gait assistance: Independent Gait Distance (Feet): 150 Feet Assistive device: None Gait Pattern/deviations: Step-through pattern;Wide base of support;Drifts right/left     General Gait Details: mild veering to R with R head turn  Stairs Stairs: Yes Stairs assistance: Supervision Stair Management: One rail Right;Step to pattern;Forwards;Alternating pattern Number of Stairs: 4 General stair comments: step through to ascend, step to to descend; LOB turning around on step with self recovery on railing  Wheelchair Mobility    Modified Rankin (Stroke Patients Only) Modified Rankin (Stroke Patients Only) Pre-Morbid Rankin Score: No symptoms Modified Rankin: No significant disability     Balance                                 Standardized Balance Assessment Standardized Balance Assessment : Berg Balance Test;Dynamic Gait Index Berg Balance Test Sit to Stand: Able to stand without using  hands and stabilize independently Standing Unsupported: Able to stand safely 2 minutes Sitting with Back Unsupported but Feet Supported on Floor or Stool: Able to sit safely and securely 2 minutes Standing Unsupported with Eyes Closed: Able to stand 10 seconds safely From Standing, Reach Forward with Outstretched Arm: Can reach confidently >25 cm (10") From Standing Position, Pick up Object from Floor: Able to pick up shoe safely and easily Turn 360 Degrees: Able to turn 360 degrees safely in 4 seconds or less Standing Unsupported, One Foot in Front: Able to plae foot ahead of the other independently and hold 30 seconds Dynamic Gait Index Level Surface: Normal Change in Gait Speed: Normal Gait with Horizontal Head Turns: Mild Impairment Gait with Vertical Head Turns: Normal Gait and Pivot Turn: Mild Impairment Steps: Mild Impairment       Pertinent Vitals/Pain Pain Assessment: No/denies pain    Home Living Family/patient expects to be discharged to:: Private residence Living Arrangements: Spouse/significant other Available Help at Discharge: Family Type of Home: House Home Access: Stairs to enter Entrance Stairs-Rails: Right Entrance Stairs-Number of Steps: 4 Home Layout: One level Home Equipment: Environmental consultant - 2 wheels      Prior Function Level of Independence: Independent               Hand Dominance   Dominant Hand: Right    Extremity/Trunk Assessment   Upper Extremity Assessment Upper Extremity Assessment: Overall WFL for tasks  assessed    Lower Extremity Assessment Lower Extremity Assessment: Overall WFL for tasks assessed       Communication   Communication: No difficulties  Cognition Arousal/Alertness: Awake/alert Behavior During Therapy: WFL for tasks assessed/performed Overall Cognitive Status: Within Functional Limits for tasks assessed                                        General Comments General comments (skin integrity,  edema, etc.): Patient reports resolution of diplopia symptoms.  Educated on stroke warning signs and risk factor modification (BE FAST).  parts of both dynamic and static balance measures demonstrate low to medium fall risk    Exercises     Assessment/Plan    PT Assessment Patent does not need any further PT services  PT Problem List         PT Treatment Interventions      PT Goals (Current goals can be found in the Care Plan section)  Acute Rehab PT Goals PT Goal Formulation: All assessment and education complete, DC therapy    Frequency     Barriers to discharge        Co-evaluation               AM-PAC PT "6 Clicks" Daily Activity  Outcome Measure Difficulty turning over in bed (including adjusting bedclothes, sheets and blankets)?: None Difficulty moving from lying on back to sitting on the side of the bed? : None Difficulty sitting down on and standing up from a chair with arms (e.g., wheelchair, bedside commode, etc,.)?: None Help needed moving to and from a bed to chair (including a wheelchair)?: None Help needed walking in hospital room?: None Help needed climbing 3-5 steps with a railing? : A Little 6 Click Score: 23    End of Session   Activity Tolerance: Patient tolerated treatment well Patient left: in bed;with call bell/phone within reach;with family/visitor present   PT Visit Diagnosis: Other abnormalities of gait and mobility (R26.89)    Time: 1120-1140 PT Time Calculation (min) (ACUTE ONLY): 20 min   Charges:   PT Evaluation $PT Eval Low Complexity: [place], [place] Acute Rehabilitation Services [phone] 10/25/2017   [name] 10/25/2017, 1:37 PM

## 2017-10-25 NOTE — Progress Notes (Addendum)
Inpatient Diabetes Program Recommendations  AACE/ADA: New Consensus Statement on Inpatient Glycemic Control (2015)  Target Ranges:  Prepandial:   less than 140 mg/dL      Peak postprandial:   less than 180 mg/dL (1-2 hours)      Critically ill patients:  140 - 180 mg/dL   Lab Results  Component Value Date   GLUCAP 104 (H) 10/25/2017   HGBA1C 6.4 (H) 10/25/2017    A1c 6.4% in the preDM range. Went to see patient and wife at bedside.  Patient reports having an A1c test drawn every few months by his PCP. Patient has been "boarderline for awhile now."  Discussed A1c level, 6.4% this admission, discussed A1c goals.  Discussed Lifestyle changes with beverages and diet. Patient and wife admitting to eating a bowl of ice cream very frequently over the summer. Discussed portion sizes and drinking diet, zero, light versions of beverages.   Discussed physical activity and exercise.  Discussed pathophysiology of changes with DM 2. Discussed for patient to follow up with PCP.   Patient and wife had no further questions.  Thanks,  Tama Headings RN, MSN, BC-ADM Inpatient Diabetes Coordinator Team Pager [phone] (8a-5p)

## 2017-10-25 NOTE — Progress Notes (Signed)
Pt watching Diabetes videos with family. Process explained to family and pt.

## 2017-10-25 NOTE — Progress Notes (Signed)
ANTICOAGULATION CONSULT NOTE - Follow-Up Consult  Pharmacy Consult for warfarin Indication: atrial fibrillation  Patient Measurements: Height: 5\' 11"  (180.3 cm) Weight: 238 lb (108 kg) IBW/kg (Calculated) : 75.3 Heparin Dosing Weight: 98.3  Vital Signs: Temp: 98.1 F (36.7 C) (09/10 1059) Temp Source: Oral (09/10 1059) BP: 138/74 (09/10 1059) Pulse Rate: 58 (09/10 1059)  Labs: Recent Labs    10/24/17 1123 10/24/17 1144 10/25/17 0354  HGB 15.9 15.6  --   HCT 47.2 46.0  --   PLT 225  --   --   APTT 36  --   --   LABPROT 24.4*  --  23.7*  INR 2.21  --  2.13  CREATININE 0.78 0.70  --     Estimated Creatinine Clearance: 92.1 mL/min (by C-G formula based on SCr of 0.7 mg/dL).  Assessment: 82yo M on chronic anticoagulation for Afib with warfarin PTA being admitted for diplopia possibly secondary to stroke. Last dose of warfarin was 9/8. INR on admission was therapeutic on PTA dose of  5mg  daily, except 2.5mg  on Tuesdays. Pharmacy asked to resume warfarin dosing this admission.   INR today remains therapeutic (INR 2.13 << 2.21, goal of 2-3). CBC wnl on 9/9 labs. No bleeding noted at this time.   Goal of Therapy:  INR 2-3 Monitor platelets by anticoagulation protocol: Yes   Plan:  - Warfarin 5 mg x 1 dose at 1800 today - Will continue to monitor for any signs/symptoms of bleeding and will follow up with PT/INR in the a.m.   Thank you for allowing pharmacy to be a part of this patient's care.  Alycia Rossetti, PharmD, BCPS Clinical Pharmacist Pager: [phone] Clinical phone for 10/25/2017 from 7a-3:30p: [phone] If after 3:30p, please call main pharmacy at: [phone] Please check AMION for all [facility] numbers 10/25/2017 11:06 AM

## 2017-10-25 NOTE — Evaluation (Signed)
Occupational Therapy Evaluation Patient Details Name: [name] MRN: [id number] DOB: 05-09-1935 Today's Date: 10/25/2017    History of Present Illness [name] is a 82 y.o. male with medical history significant of HTN; GERD; afib on Coumadin; HLD; CAD; s/p MVR; and BPH presenting with diplopia.   Clinical Impression   Patient evaluated by Occupational Therapy with no further acute OT needs identified. All education has been completed and the patient has no further questions. Pt appears back to baseline.  Reviewed BEFAST.  See below for any follow-up Occupational Therapy or equipment needs. OT is signing off. Thank you for this referral. ]    Follow Up Recommendations  No OT follow up    Equipment Recommendations  None recommended by OT    Recommendations for Other Services       Precautions / Restrictions Precautions Precautions: None      Mobility Bed Mobility Overal bed mobility: Independent                Transfers Overall transfer level: Independent                    Balance                                 Standardized Balance Assessment Standardized Balance Assessment : Berg Balance Test;Dynamic Gait Index Berg Balance Test Sit to Stand: Able to stand without using hands and stabilize independently Standing Unsupported: Able to stand safely 2 minutes Sitting with Back Unsupported but Feet Supported on Floor or Stool: Able to sit safely and securely 2 minutes Standing Unsupported with Eyes Closed: Able to stand 10 seconds safely From Standing, Reach Forward with Outstretched Arm: Can reach confidently >25 cm (10") From Standing Position, Pick up Object from Floor: Able to pick up shoe safely and easily Turn 360 Degrees: Able to turn 360 degrees safely in 4 seconds or less Standing Unsupported, One Foot in Front: Able to plae foot ahead of the other independently and hold 30 seconds Dynamic Gait Index Level Surface:  Normal Change in Gait Speed: Normal Gait with Horizontal Head Turns: Mild Impairment Gait with Vertical Head Turns: Normal Gait and Pivot Turn: Mild Impairment Steps: Mild Impairment     ADL either performed or assessed with clinical judgement   ADL Overall ADL's : At baseline;Independent                                             Vision Baseline Vision/History: Wears glasses Wears Glasses: Reading only Patient Visual Report: Diplopia(that has resolved ) Vision Assessment?: Yes Eye Alignment: Within Functional Limits Ocular Range of Motion: Within Functional Limits Alignment/Gaze Preference: Within Defined Limits Tracking/Visual Pursuits: Able to track stimulus in all quads without difficulty Saccades: Within functional limits Convergence: Within functional limits Visual Fields: No apparent deficits Additional Comments: Pt reports no diplopia.  negative cover/uncover test.   No diplopia elicited with rapid head turns      Perception Perception Perception Tested?: Yes   Praxis Praxis Praxis tested?: Within functional limits    Pertinent Vitals/Pain Pain Assessment: No/denies pain     Hand Dominance Right   Extremity/Trunk Assessment Upper Extremity Assessment Upper Extremity Assessment: Overall WFL for tasks assessed   Lower Extremity Assessment Lower Extremity Assessment: Overall WFL for tasks assessed  Cervical / Trunk Assessment Cervical / Trunk Assessment: Normal   Communication Communication Communication: No difficulties   Cognition Arousal/Alertness: Awake/alert Behavior During Therapy: WFL for tasks assessed/performed Overall Cognitive Status: Within Functional Limits for tasks assessed                                     General Comments  reviewed BEFAST with pt, wife, and son.     Exercises     Shoulder Instructions      Home Living Family/patient expects to be discharged to:: Private residence Living  Arrangements: Spouse/significant other Available Help at Discharge: Family Type of Home: House Home Access: Stairs to enter Technical brewer of Steps: 4 Entrance Stairs-Rails: Right Home Layout: One level     Bathroom Shower/Tub: Occupational psychologist: Handicapped height     [place]: Environmental consultant - 2 wheels      Lives With: Spouse    Prior Functioning/Environment Level of Independence: Independent        Comments: retired from [employer] List: Impaired vision/perception      OT Treatment/Interventions:      OT Goals(Current goals can be found in the care plan section) Acute Rehab OT Goals Patient Stated Goal: to go home  OT Goal Formulation: All assessment and education complete, DC therapy  OT Frequency:     Barriers to D/C:            Co-evaluation              AM-PAC PT "6 Clicks" Daily Activity     Outcome Measure Help from another person eating meals?: None Help from another person taking care of personal grooming?: None Help from another person toileting, which includes using toliet, bedpan, or urinal?: None Help from another person bathing (including washing, rinsing, drying)?: None Help from another person to put on and taking off regular upper body clothing?: None Help from another person to put on and taking off regular lower body clothing?: None 6 Click Score: 24   End of Session    Activity Tolerance: Patient tolerated treatment well Patient left: in bed;with call bell/phone within reach;with family/visitor present  OT Visit Diagnosis: Low vision, both eyes (H54.2)                Time: 5784-6962 OT Time Calculation (min): 20 min Charges:  OT General Charges $OT Visit: 1 Visit OT Evaluation $OT Eval Low Complexity: 1 Low  Lucille Passy, OTR/L Acute Rehabilitation Services Pager [phone] Office [phone]   Lucille Passy M 10/25/2017, 3:23 PM

## 2017-10-25 NOTE — Progress Notes (Signed)
Dr. Xu at bedside

## 2017-10-25 NOTE — Discharge Summary (Signed)
Physician Discharge Summary  [name] HYW:[id number] DOB: 02-07-1936 DOA: 10/24/2017  PCP: Dione Housekeeper, MD  Admit date: 10/24/2017 Discharge date: 10/25/2017  Admitted From: Home Disposition: Home  Recommendations for Outpatient Follow-up:  1. Follow up with PCP in 1-2 weeks   Home Health: No Equipment/Devices: None  Discharge Condition: Stable CODE STATUS: Full code Diet recommendation: Heart Healthy   Brief/Interim Summary: [name] is a 82 y.o. male with medical history significant of HTN; GERD; afib on Coumadin; HLD; CAD; s/p MVR; and BPH presenting with diplopia.  He woke up yesterday morning with double vision.    It is now completely resolved.  He has had brief episodes of this in the past, 1-2 minutes at a time.  He has had maybe 3-4 of those episodes in the last 12 months.  This time, it has been constant.  Occasional food dysphagia.  No dysarthria.  No N/W/T of arms/legs today.  Ophthalmology exam showed "Maddox rod testing consistent with skew deviation which would localize to possible brain stem or cerebellum pathology."    MRI/MRA of the brain was unremarkable.  Echocardiogram showed a severely dilated left atrium compatible with his atrial fibrillation but was otherwise unremarkable with normal systolic function.  Carotid ultrasounds bilaterally show 1 to 39% stenosis and vertebral arteries are antegrade.  Neurologist feels patient has had a small brainstem infarct that was not seen on imaging.  Feel that it is due to embolic due to known atrial fibrillation and diplopia lasted over 8 hours.  They recommend consideration of changing to a novel oral anticoagulant from warfarin.  Patient will not be prescribed aspirin at discharge as he is already anticoagulated on warfarin.  Symptoms have completely resolved and at this point he is stable for discharge home  Patient has reached maximal benefit of hospitalization.  Discharge diagnosis, prognosis, plans,  follow-up, medications and treatments discussed with the patient(or responsible party) and is in agreement with the plans as described.  Patient is stable for discharge.  Discharge Diagnoses:  Principal Problem:   Brainstem infarction [name]:   Diplopia   Type 2 diabetes mellitus without complication ([place])   Essential hypertension   ATRIAL FIBRILLATION   Sleep apnea   Carotid stenosis   Dyslipidemia    Discharge Instructions  Discharge Instructions    Ambulatory referral to Neurology   Complete by:  As directed    Follow up with stroke clinic NP (Jessica Vanschaick or Cecille Rubin, if both not available, consider Dr. Antony Contras, Dr. Bess Harvest, or Dr. Sarina Ill) at [facility] in about 4 weeks.     Allergies as of 10/25/2017      Reactions   Flomax [tamsulosin Hcl] Other (See Comments)   Makes him "swimmy headed"      Medication List    TAKE these medications   amLODipine 2.5 MG tablet Commonly known as:  NORVASC Take 2.5 mg by mouth daily before breakfast.   atorvastatin 10 MG tablet Commonly known as:  LIPITOR Take 1 tablet (10 mg total) by mouth daily at 6 PM.   Coenzyme Q10 200 MG capsule Take 200 mg by mouth 2 (two) times daily.   lisinopril 20 MG tablet Commonly known as:  PRINIVIL,ZESTRIL TAKE 1 TABLET (20 MG TOTAL) BY MOUTH DAILY BEFORE BREAKFAST. What changed:  when to take this   MULTIPLE VITAMIN PO Take 1 tablet by mouth daily.   omeprazole 20 MG capsule Commonly known as:  PRILOSEC Take 20 mg  by mouth daily before breakfast.   TYLENOL 500 MG tablet Generic drug:  acetaminophen Take 500 mg by mouth as needed for mild pain.   vitamin C 500 MG tablet Commonly known as:  ASCORBIC ACID Take 500 mg by mouth daily.   warfarin 5 MG tablet Commonly known as:  COUMADIN Take as directed. If you are unsure how to take this medication, talk to your nurse or doctor. Original instructions:  Take 1/2 to 1  tablet by mouth daily or as directed by coumadin clinic What changed:  See the new instructions.      Follow-up Information    [facility] Follow up in 4 week(s).   Specialty:  Neurology Why:  stroke clinic. office will call with appt date and time. Contact information: [place] [phone]         Allergies  Allergen Reactions  . Flomax [Tamsulosin Hcl] Other (See Comments)    Makes him "swimmy headed"    Consultations:  None   Procedures/Studies: Ct Head Wo Contrast  Result Date: 10/24/2017 CLINICAL DATA:  82 year old male with double vision upon waking. Initial encounter. EXAM: CT HEAD WITHOUT CONTRAST TECHNIQUE: Contiguous axial images were obtained from the base of the skull through the vertex without intravenous contrast. COMPARISON:  10/28/2010 CT. FINDINGS: Brain: No intracranial hemorrhage or CT evidence of large acute infarct. Remote small superior left cerebellar infarct. Chronic microvascular changes. Global atrophy. No intracranial mass lesion noted on this unenhanced exam. Vascular: Vascular calcifications.  No hyperdense vessel. Skull: Negative Sinuses/Orbits: No acute orbital abnormality. Post lens replacement. Polypoid opacification right sphenoid sinus. Previously sphenoid sinus was completely opacified. What remains may represent inspissated chronic sinusitis changes. Surrounding wall thickening consistent with chronic sinusitis. Minimal polypoid opacification roof of left maxillary sinus. Other: Mastoid air cells and middle ear cavities are clear. IMPRESSION: 1. No intracranial hemorrhage or CT evidence of large acute infarct. 2. Remote small superior left cerebellar infarct. 3. Chronic microvascular changes. 4. Global atrophy. 5. Polypoid opacification right sphenoid sinus suggestive of inspissated material from changes of chronic sinusitis as noted above. Electronically Signed   By: Genia Del M.D.   On: 10/24/2017 12:18   Mr Jodene Nam Head Wo Contrast  Result Date: 10/24/2017 CLINICAL DATA:  Initial evaluation for acute diplopia. EXAM: MRI HEAD WITHOUT CONTRAST MRA HEAD WITHOUT CONTRAST TECHNIQUE: Multiplanar, multiecho pulse sequences of the brain and surrounding structures were obtained without intravenous contrast. Angiographic images of the head were obtained using MRA technique without contrast. COMPARISON:  Prior CT from earlier the same day. FINDINGS: MRI HEAD FINDINGS Brain: Diffuse prominence of the CSF containing spaces compatible with generalized age-related cerebral atrophy. Patchy and confluent T2/FLAIR hyperintensity within the periventricular deep white matter both cerebral hemispheres, most consistent with chronic small vessel ischemic disease, moderate nature. Small remote left cerebellar infarct noted. No abnormal foci of restricted diffusion to suggest acute or subacute ischemia. Gray-white matter differentiation maintained. No encephalomalacia to suggest chronic cortical infarction. No acute or chronic intracranial hemorrhage. No mass lesion, midline shift or mass effect. No hydrocephalus. No extra-axial fluid collection. Normal pituitary gland. Vascular: Major intracranial vascular flow voids maintained. Skull and upper cervical spine: Craniocervical junction normal. Upper cervical spine normal. Bone marrow signal intensity within normal limits. No scalp soft tissue abnormality. Sinuses/Orbits: Globes and orbital soft tissues within normal limits. Patient status post ocular lens replacement bilaterally. Retention cyst noted within the right sphenoid and left maxillary sinuses. Paranasal sinuses are  otherwise clear. No mastoid effusion. Inner ear structures normal. Other: None. MRA HEAD FINDINGS ANTERIOR CIRCULATION: Distal cervical segments of the internal carotid arteries are patent with antegrade flow. Petrous, cavernous, and supraclinoid segments widely patent bilaterally. A1  segments widely patent. Normal anterior communicating artery. Anterior cerebral arteries mildly tortuous but widely patent to their distal aspects without stenosis. M1 segments widely patent bilaterally. Normal MCA bifurcations. No proximal M2 occlusion. Distal MCA branches well perfused and symmetric. POSTERIOR CIRCULATION: Vertebral arteries diminutive bilaterally, with the right slightly dominant. Vertebral arteries patent to the vertebrobasilar junction without stenosis. Posterior inferior cerebral arteries patent bilaterally. Basilar tortuous but widely patent to its distal aspect without stenosis. Superior cerebral arteries patent bilaterally. Fetal type origin of the PCAs supplied via widely patent posterior communicating arteries. PCAs widely patent to their distal aspects. No intracranial aneurysm. IMPRESSION: MRI HEAD IMPRESSION: 1. No acute intracranial infarct or other abnormality identified. 2. Age-related cerebral atrophy with moderate chronic small vessel ischemic disease. 3. Small remote left cerebellar infarct. MRA HEAD IMPRESSION: Negative intracranial MRA. No large vessel occlusion. No hemodynamically significant or correctable stenosis. Electronically Signed   By: Jeannine Boga M.D.   On: 10/24/2017 23:01   Mr Brain Wo Contrast  Result Date: 10/24/2017 CLINICAL DATA:  Initial evaluation for acute diplopia. EXAM: MRI HEAD WITHOUT CONTRAST MRA HEAD WITHOUT CONTRAST TECHNIQUE: Multiplanar, multiecho pulse sequences of the brain and surrounding structures were obtained without intravenous contrast. Angiographic images of the head were obtained using MRA technique without contrast. COMPARISON:  Prior CT from earlier the same day. FINDINGS: MRI HEAD FINDINGS Brain: Diffuse prominence of the CSF containing spaces compatible with generalized age-related cerebral atrophy. Patchy and confluent T2/FLAIR hyperintensity within the periventricular deep white matter both cerebral hemispheres, most  consistent with chronic small vessel ischemic disease, moderate nature. Small remote left cerebellar infarct noted. No abnormal foci of restricted diffusion to suggest acute or subacute ischemia. Gray-white matter differentiation maintained. No encephalomalacia to suggest chronic cortical infarction. No acute or chronic intracranial hemorrhage. No mass lesion, midline shift or mass effect. No hydrocephalus. No extra-axial fluid collection. Normal pituitary gland. Vascular: Major intracranial vascular flow voids maintained. Skull and upper cervical spine: Craniocervical junction normal. Upper cervical spine normal. Bone marrow signal intensity within normal limits. No scalp soft tissue abnormality. Sinuses/Orbits: Globes and orbital soft tissues within normal limits. Patient status post ocular lens replacement bilaterally. Retention cyst noted within the right sphenoid and left maxillary sinuses. Paranasal sinuses are otherwise clear. No mastoid effusion. Inner ear structures normal. Other: None. MRA HEAD FINDINGS ANTERIOR CIRCULATION: Distal cervical segments of the internal carotid arteries are patent with antegrade flow. Petrous, cavernous, and supraclinoid segments widely patent bilaterally. A1 segments widely patent. Normal anterior communicating artery. Anterior cerebral arteries mildly tortuous but widely patent to their distal aspects without stenosis. M1 segments widely patent bilaterally. Normal MCA bifurcations. No proximal M2 occlusion. Distal MCA branches well perfused and symmetric. POSTERIOR CIRCULATION: Vertebral arteries diminutive bilaterally, with the right slightly dominant. Vertebral arteries patent to the vertebrobasilar junction without stenosis. Posterior inferior cerebral arteries patent bilaterally. Basilar tortuous but widely patent to its distal aspect without stenosis. Superior cerebral arteries patent bilaterally. Fetal type origin of the PCAs supplied via widely patent posterior  communicating arteries. PCAs widely patent to their distal aspects. No intracranial aneurysm. IMPRESSION: MRI HEAD IMPRESSION: 1. No acute intracranial infarct or other abnormality identified. 2. Age-related cerebral atrophy with moderate chronic small vessel ischemic disease. 3. Small remote left cerebellar infarct. MRA HEAD  IMPRESSION: Negative intracranial MRA. No large vessel occlusion. No hemodynamically significant or correctable stenosis. Electronically Signed   By: Jeannine Boga M.D.   On: 10/24/2017 23:01      Subjective: No new complaints.  Symptoms completely resolved.  Discharge Exam: Vitals:   10/25/17 1059 10/25/17 1522  BP: 138/74 133/88  Pulse: (!) 58 65  Resp: 16 16  Temp: 98.1 F (36.7 C) 98 F (36.7 C)  SpO2: 96% 95%   Vitals:   10/25/17 0624 10/25/17 1012 10/25/17 1059 10/25/17 1522  BP: 131/80 140/82 138/74 133/88  Pulse: (!) 113 67 (!) 58 65  Resp: 17  16 16   Temp: 98 F (36.7 C)  98.1 F (36.7 C) 98 F (36.7 C)  TempSrc: Oral  Oral Oral  SpO2: 98% 93% 96% 95%  Weight:      Height:        General: Pt is alert, awake, not in acute distress Cardiovascular: RRR, S1/S2 +, no rubs, no gallops Respiratory: CTA bilaterally, no wheezing, no rhonchi Abdominal: Soft, NT, ND, bowel sounds + Extremities: no edema, no cyanosis    The results of significant diagnostics from this hospitalization (including imaging, microbiology, ancillary and laboratory) are listed below for reference.     Microbiology: No results found for this or any previous visit (from the past 240 hour(s)).   Labs: BNP (last 3 results) No results for input(s): BNP in the last 8760 hours. Basic Metabolic Panel: Recent Labs  Lab 10/24/17 1123 10/24/17 1144  NA 140 141  K 4.1 4.1  CL 104 103  CO2 28  --   GLUCOSE 124* 119*  BUN 14 17  CREATININE 0.78 0.70  CALCIUM 8.6*  --    Liver Function Tests: Recent Labs  Lab 10/24/17 1123  AST 22  ALT 18  ALKPHOS 68   BILITOT 0.9  PROT 7.2  ALBUMIN 4.1   No results for input(s): LIPASE, AMYLASE in the last 168 hours. No results for input(s): AMMONIA in the last 168 hours. CBC: Recent Labs  Lab 10/24/17 1123 10/24/17 1144  WBC 6.9  --   NEUTROABS 4.4  --   HGB 15.9 15.6  HCT 47.2 46.0  MCV 94.8  --   PLT 225  --    Cardiac Enzymes: No results for input(s): CKTOTAL, CKMB, CKMBINDEX, TROPONINI in the last 168 hours. BNP: Invalid input(s): POCBNP CBG: Recent Labs  Lab 10/25/17 0812 10/25/17 1226  GLUCAP 159* 104*   D-Dimer No results for input(s): DDIMER in the last 72 hours. Hgb A1c Recent Labs    10/25/17 0354  HGBA1C 6.4*   Lipid Profile Recent Labs    10/25/17 0354  CHOL 162  HDL 36*  LDLCALC 89  TRIG 186*  CHOLHDL 4.5   Thyroid function studies Recent Labs    10/24/17 1638  TSH 1.447   Anemia work up No results for input(s): VITAMINB12, FOLATE, FERRITIN, TIBC, IRON, RETICCTPCT in the last 72 hours. Urinalysis    Component Value Date/Time   COLORURINE YELLOW 02/11/2016 1127   APPEARANCEUR CLEAR 02/11/2016 1127   LABSPEC 1.018 02/11/2016 1127   PHURINE 6.0 02/11/2016 1127   GLUCOSEU NEGATIVE 02/11/2016 1127   HGBUR NEGATIVE 02/11/2016 1127   BILIRUBINUR NEGATIVE 02/11/2016 1127   KETONESUR NEGATIVE 02/11/2016 1127   PROTEINUR NEGATIVE 02/11/2016 1127   NITRITE NEGATIVE 02/11/2016 1127   LEUKOCYTESUR NEGATIVE 02/11/2016 1127   Sepsis Labs Invalid input(s): PROCALCITONIN,  WBC,  LACTICIDVEN Microbiology No results found for this or any  previous visit (from the past 240 hour(s)).   Time coordinating discharge: 47 minutes  SIGNED:   Lady Deutscher, MD  FACP [facility] 10/25/2017, 3:40 PM Pager   If 7PM-7AM, please contact night-coverage [email or web address] Password [email or web address]

## 2017-10-25 NOTE — Care Management Obs Status (Signed)
[place] NOTIFICATION   Patient Details  Name: [name] MRN: [id number] Date of Birth: 08-Jan-1936   Medicare Observation Status Notification Given:  Yes    Midge Minium RN, BSN, NCM-BC, ACM-RN [phone] 10/25/2017, 3:44 PM

## 2017-10-25 NOTE — Progress Notes (Addendum)
STROKE TEAM PROGRESS NOTE   INTERVAL HISTORY His wife is at the bedside.  Patient with resolved diplopia. Back to baseline. Workup completed. Discussed possible change in [facility]. Dr. Percival Spanish originally prescribed.  Vitals:   10/25/17 0330 10/25/17 0448 10/25/17 0500 10/25/17 0624  BP:  126/69  131/80  Pulse:  (!) 57  (!) 113  Resp:  17  17  Temp:  97.8 F (36.6 C)  98 F (36.7 C)  TempSrc:  Oral  Oral  SpO2: 97% 96% (!) 79% 98%  Weight:      Height:        CBC:  Recent Labs  Lab 10/24/17 1123 10/24/17 1144  WBC 6.9  --   NEUTROABS 4.4  --   HGB 15.9 15.6  HCT 47.2 46.0  MCV 94.8  --   PLT 225  --     Basic Metabolic Panel:  Recent Labs  Lab 10/24/17 1123 10/24/17 1144  NA 140 141  K 4.1 4.1  CL 104 103  CO2 28  --   GLUCOSE 124* 119*  BUN 14 17  CREATININE 0.78 0.70  CALCIUM 8.6*  --    Lipid Panel:     Component Value Date/Time   CHOL 162 10/25/2017 0354   TRIG 186 (H) 10/25/2017 0354   HDL 36 (L) 10/25/2017 0354   CHOLHDL 4.5 10/25/2017 0354   VLDL 37 10/25/2017 0354   LDLCALC 89 10/25/2017 0354   HgbA1c:  Lab Results  Component Value Date   HGBA1C 6.4 (H) 10/25/2017   Urine Drug Screen:     Component Value Date/Time   LABOPIA NONE DETECTED 10/24/2017 2149   COCAINSCRNUR NONE DETECTED 10/24/2017 2149   LABBENZ NONE DETECTED 10/24/2017 2149   AMPHETMU NONE DETECTED 10/24/2017 2149   THCU NONE DETECTED 10/24/2017 2149   LABBARB NONE DETECTED 10/24/2017 2149    Alcohol Level No results found for: ETH  IMAGING Ct Head Wo Contrast  Result Date: 10/24/2017 CLINICAL DATA:  82 year old male with double vision upon waking. Initial encounter. EXAM: CT HEAD WITHOUT CONTRAST TECHNIQUE: Contiguous axial images were obtained from the base of the skull through the vertex without intravenous contrast. COMPARISON:  10/28/2010 CT. FINDINGS: Brain: No intracranial hemorrhage or CT evidence of large acute infarct. Remote small superior left cerebellar infarct.  Chronic microvascular changes. Global atrophy. No intracranial mass lesion noted on this unenhanced exam. Vascular: Vascular calcifications.  No hyperdense vessel. Skull: Negative Sinuses/Orbits: No acute orbital abnormality. Post lens replacement. Polypoid opacification right sphenoid sinus. Previously sphenoid sinus was completely opacified. What remains may represent inspissated chronic sinusitis changes. Surrounding wall thickening consistent with chronic sinusitis. Minimal polypoid opacification roof of left maxillary sinus. Other: Mastoid air cells and middle ear cavities are clear. IMPRESSION: 1. No intracranial hemorrhage or CT evidence of large acute infarct. 2. Remote small superior left cerebellar infarct. 3. Chronic microvascular changes. 4. Global atrophy. 5. Polypoid opacification right sphenoid sinus suggestive of inspissated material from changes of chronic sinusitis as noted above. Electronically Signed   By: Genia Del M.D.   On: 10/24/2017 12:18   Mr Jodene Nam Head Wo Contrast  Result Date: 10/24/2017 CLINICAL DATA:  Initial evaluation for acute diplopia. EXAM: MRI HEAD WITHOUT CONTRAST MRA HEAD WITHOUT CONTRAST TECHNIQUE: Multiplanar, multiecho pulse sequences of the brain and surrounding structures were obtained without intravenous contrast. Angiographic images of the head were obtained using MRA technique without contrast. COMPARISON:  Prior CT from earlier the same day. FINDINGS: MRI HEAD FINDINGS Brain: Diffuse prominence  of the CSF containing spaces compatible with generalized age-related cerebral atrophy. Patchy and confluent T2/FLAIR hyperintensity within the periventricular deep white matter both cerebral hemispheres, most consistent with chronic small vessel ischemic disease, moderate nature. Small remote left cerebellar infarct noted. No abnormal foci of restricted diffusion to suggest acute or subacute ischemia. Gray-white matter differentiation maintained. No encephalomalacia to  suggest chronic cortical infarction. No acute or chronic intracranial hemorrhage. No mass lesion, midline shift or mass effect. No hydrocephalus. No extra-axial fluid collection. Normal pituitary gland. Vascular: Major intracranial vascular flow voids maintained. Skull and upper cervical spine: Craniocervical junction normal. Upper cervical spine normal. Bone marrow signal intensity within normal limits. No scalp soft tissue abnormality. Sinuses/Orbits: Globes and orbital soft tissues within normal limits. Patient status post ocular lens replacement bilaterally. Retention cyst noted within the right sphenoid and left maxillary sinuses. Paranasal sinuses are otherwise clear. No mastoid effusion. Inner ear structures normal. Other: None. MRA HEAD FINDINGS ANTERIOR CIRCULATION: Distal cervical segments of the internal carotid arteries are patent with antegrade flow. Petrous, cavernous, and supraclinoid segments widely patent bilaterally. A1 segments widely patent. Normal anterior communicating artery. Anterior cerebral arteries mildly tortuous but widely patent to their distal aspects without stenosis. M1 segments widely patent bilaterally. Normal MCA bifurcations. No proximal M2 occlusion. Distal MCA branches well perfused and symmetric. POSTERIOR CIRCULATION: Vertebral arteries diminutive bilaterally, with the right slightly dominant. Vertebral arteries patent to the vertebrobasilar junction without stenosis. Posterior inferior cerebral arteries patent bilaterally. Basilar tortuous but widely patent to its distal aspect without stenosis. Superior cerebral arteries patent bilaterally. Fetal type origin of the PCAs supplied via widely patent posterior communicating arteries. PCAs widely patent to their distal aspects. No intracranial aneurysm. IMPRESSION: MRI HEAD IMPRESSION: 1. No acute intracranial infarct or other abnormality identified. 2. Age-related cerebral atrophy with moderate chronic small vessel ischemic  disease. 3. Small remote left cerebellar infarct. MRA HEAD IMPRESSION: Negative intracranial MRA. No large vessel occlusion. No hemodynamically significant or correctable stenosis. Electronically Signed   By: Jeannine Boga M.D.   On: 10/24/2017 23:01   Mr Brain Wo Contrast  Result Date: 10/24/2017 CLINICAL DATA:  Initial evaluation for acute diplopia. EXAM: MRI HEAD WITHOUT CONTRAST MRA HEAD WITHOUT CONTRAST TECHNIQUE: Multiplanar, multiecho pulse sequences of the brain and surrounding structures were obtained without intravenous contrast. Angiographic images of the head were obtained using MRA technique without contrast. COMPARISON:  Prior CT from earlier the same day. FINDINGS: MRI HEAD FINDINGS Brain: Diffuse prominence of the CSF containing spaces compatible with generalized age-related cerebral atrophy. Patchy and confluent T2/FLAIR hyperintensity within the periventricular deep white matter both cerebral hemispheres, most consistent with chronic small vessel ischemic disease, moderate nature. Small remote left cerebellar infarct noted. No abnormal foci of restricted diffusion to suggest acute or subacute ischemia. Gray-white matter differentiation maintained. No encephalomalacia to suggest chronic cortical infarction. No acute or chronic intracranial hemorrhage. No mass lesion, midline shift or mass effect. No hydrocephalus. No extra-axial fluid collection. Normal pituitary gland. Vascular: Major intracranial vascular flow voids maintained. Skull and upper cervical spine: Craniocervical junction normal. Upper cervical spine normal. Bone marrow signal intensity within normal limits. No scalp soft tissue abnormality. Sinuses/Orbits: Globes and orbital soft tissues within normal limits. Patient status post ocular lens replacement bilaterally. Retention cyst noted within the right sphenoid and left maxillary sinuses. Paranasal sinuses are otherwise clear. No mastoid effusion. Inner ear structures normal.  Other: None. MRA HEAD FINDINGS ANTERIOR CIRCULATION: Distal cervical segments of the internal carotid arteries are patent with  antegrade flow. Petrous, cavernous, and supraclinoid segments widely patent bilaterally. A1 segments widely patent. Normal anterior communicating artery. Anterior cerebral arteries mildly tortuous but widely patent to their distal aspects without stenosis. M1 segments widely patent bilaterally. Normal MCA bifurcations. No proximal M2 occlusion. Distal MCA branches well perfused and symmetric. POSTERIOR CIRCULATION: Vertebral arteries diminutive bilaterally, with the right slightly dominant. Vertebral arteries patent to the vertebrobasilar junction without stenosis. Posterior inferior cerebral arteries patent bilaterally. Basilar tortuous but widely patent to its distal aspect without stenosis. Superior cerebral arteries patent bilaterally. Fetal type origin of the PCAs supplied via widely patent posterior communicating arteries. PCAs widely patent to their distal aspects. No intracranial aneurysm. IMPRESSION: MRI HEAD IMPRESSION: 1. No acute intracranial infarct or other abnormality identified. 2. Age-related cerebral atrophy with moderate chronic small vessel ischemic disease. 3. Small remote left cerebellar infarct. MRA HEAD IMPRESSION: Negative intracranial MRA. No large vessel occlusion. No hemodynamically significant or correctable stenosis. Electronically Signed   By: Jeannine Boga M.D.   On: 10/24/2017 23:01   2D Echocardiogram  - Left ventricle: The cavity size was normal. There was mild concentric hypertrophy. Systolic function was normal. The estimated ejection fraction was in the range of 60% to 65%. Wall motion was normal; there were no regional wall motion abnormalities. - Aortic valve: Transvalvular velocity was within the normal range. There was no stenosis. There was no regurgitation. - Mitral valve: Transvalvular velocity was within the normal range. There was no  evidence for stenosis. There was mild regurgitation. Valve area by continuity equation (using LVOT flow): 0.9 cm^2. - Left atrium: The atrium was severely dilated. - Right ventricle: The cavity size was normal. Wall thickness was normal. Systolic function was normal. - Tricuspid valve: There was trivial regurgitation. - Pulmonary arteries: Systolic pressure was mildly increased. PA peak pressure: 41 mm Hg (S).  Carotid Doppler   There is 1-39% bilateral ICA stenosis. Vertebral artery flow is antegrade.    PHYSICAL EXAM GENERAL: Awake, alert in NAD HEENT: - Normocephalic and atraumatic, dry mm,  LUNGS - Clear to auscultation bilaterally with no wheezes CV - S1S2 RRR, no m/r/g, equal pulses bilaterally. Ext: warm, well perfused, intact peripheral pulses,   NEURO:  Mental Status: AA&Ox3, speech is clear.  Naming, repetition, fluency, and comprehension intact. Cranial Nerves: PERRL. EOMI. No nystagmus. no reported diplopia. visual fields full, no facial asymmetry, facial sensation intact, auditory acuity grossly intact. Motor: 5/5 all over with no vertical drift. Tone: is normal and bulk is normal Sensation- Intact to light touch bilaterally Coordination: FTN intact bilaterally   ASSESSMENT/PLAN [name] is a 82 y.o. male with history of AF on warfarin, MV repair, HTN and OSA presenting with diplopia and L gaze.   Posterior circulation TIA most likely, embolic secondary to known afib with diplopia lasting over 8h  CT head No acute stroke. 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 Small vessel disease. Global Atrophy. Chronic sinusitis  MRI  No acute stroke Small vessel disease. Atrophy.old L cerebellar inafarct  MRA  Unremarkable   Carotid Doppler  B ICA 1-39% stenosis, VAs antegrade   2D Echo  EF 60-65%. No source of embolus  LDL 89  HgbA1c 6.4  Warfarin for VTE prophylaxis  warfarin daily prior to admission, INR 2.21 now on aspirin 81 mg daily and warfarin daily INR 2.13.  No indication for additional aspirin. Discussed change to DOAC. Pt/family would like to discuss with Dr. Percival Spanish (original prescriber) proir to change. Continue warfarin for now, would increase  goal to 2.5-3. Verified insurance coverage for all DOACS (see separate note from Care Management. Pt informed)  Therapy recommendations:  No therapy needs  Disposition:  Return home  Follow up stroke clinic 4 weeks. Order placed.  Valvular Atrial Fibrillation  Home anticoagulation:  warfarin daily continued in the hospital  INR 2.21 on admission  Consider change to DOAC  Discuss w/ Dr. Percival Spanish  Has insurance coverage for DOACs . Continue warfarin daily at discharge   Hypertension  Stable . BP goal normotensive  Hyperlipidemia  Home meds:  No statin  Placed on lipitor 80 on admission  LDL 89, goal < 70  Has had malgias on pravachol in the past  Decrease statin dose to 10 daily  Continue statin at discharge  Recheck in 6-8 weeks  Diabetes type II  HgbA1c 6.4, goal < 7.0  DB RN Coordinator on board  Other Stroke Risk Factors  Advanced age  Obesity, Body mass index is 33.19 kg/m., recommend weight loss, diet and exercise as appropriate   Obstructive sleep apnea, on CPAP at home  Hospital day # 0  Burnetta Sabin, MSN, APRN, ANVP-BC, AGPCNP-BC Advanced [facility] Stroke Nurse [place] for Schedule & Pager information 10/25/2017 2:45 PM    ATTENDING NOTE: I reviewed above note and agree with the assessment and plan. Pt was seen and examined.   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 fib on Coumadin, hypertension admitted for diplopia on left gaze.  INR 2.21 on admission.  Overnight his symptoms resolved.  MRI showed no acute infarct.  Only old left small cerebellum infarct.  MRA negative.  EF 60 to 65%.  Carotid Doppler unremarkable.  LDL 89 and A1c 6.4.  INR 2.13.  Creatinine 0.7.  Had long  discussion with wife and patient at bedside.  Patient symptoms this time concerning for posterior circulation TIA.  Given his episode on Coumadin with INR 2.2, recommend either continue Coumadin with INR goal 2.5-3.0 or switch to DOACs such as Eliquis.  Although FDA has not approved for Eliquis in valvular A. fib, however postmarketing study showed also effective nonvalvular A. Fib.  After discussion, patient would like to continue Coumadin at this time and he will discuss with his cardiologist as outpatient regarding Eliquis.  He is on aspirin 81 at this time, would recommend to continue aspirin 81 with Coumadin for now.  Once INR between 2.5-3.0, aspirin 81 can be discontinued.  Patient and wife expressed understanding of the plan.  Continue Lipitor.  PT/OT no recommendation.  Neurology will sign off. Please call with questions. Pt will follow up with stroke clinic NP at [facility] in about 4 weeks. Thanks for the consult.   Rosalin Hawking, MD PhD Stroke Neurology 10/25/2017 3:31 PM    To contact Stroke Continuity provider, please refer to [email or web address] After hours, contact General Neurology

## 2017-10-25 NOTE — Discharge Instructions (Signed)

## 2017-10-26 ENCOUNTER — Telehealth: Payer: Self-pay | Admitting: *Deleted

## 2017-10-26 NOTE — Telephone Encounter (Signed)
Please give pt's wife a call

## 2017-10-26 NOTE — Telephone Encounter (Signed)
Pt woke up with double vision on 10/24/17.  Went to eye Dr.  Michela Pitcher it could have been a small stroke behind the eye or could be coming from blood sugars being to high.  He was sent to [facility] ED.  INR was 2.2  They increase coumadin dose to 5mg  daily.  Wife calling to see if this is OK.  Told her to increase dose as ordered and keep INR appt.  She verbalized understanding.

## 2017-11-01 LAB — ACETYLCHOLINE RECEPTOR AB, ALL
ACETYLCHOL BLOCK AB: 13 % (ref 0–25)
Acety choline binding ab: <0.03 nmol/L (ref 0.00–0.24)

## 2017-11-08 ENCOUNTER — Ambulatory Visit: Payer: Medicare Other | Admitting: *Deleted

## 2017-11-08 DIAGNOSIS — I4891 Unspecified atrial fibrillation: Secondary | ICD-10-CM

## 2017-11-08 DIAGNOSIS — Z7901 Long term (current) use of anticoagulants: Secondary | ICD-10-CM | POA: Diagnosis not present

## 2017-11-08 DIAGNOSIS — Z5181 Encounter for therapeutic drug level monitoring: Secondary | ICD-10-CM

## 2017-11-08 DIAGNOSIS — I481 Persistent atrial fibrillation: Secondary | ICD-10-CM

## 2017-11-08 DIAGNOSIS — I4819 Other persistent atrial fibrillation: Secondary | ICD-10-CM

## 2017-11-08 LAB — POCT INR: INR: 3.2 — AB (ref 2.0–3.0)

## 2017-11-08 NOTE — Patient Instructions (Signed)
Restart coumadin 1 tablet daily except 1/2 tablet on Tuesdays  Recheck on 10/8 On Doxycycline 100mg  bid x 14 days.  Will finish 11/20/17

## 2017-11-10 ENCOUNTER — Telehealth: Payer: Self-pay | Admitting: *Deleted

## 2017-11-10 ENCOUNTER — Telehealth: Payer: Self-pay | Admitting: Cardiovascular Disease

## 2017-11-10 NOTE — Telephone Encounter (Signed)
Received a message from patient's wife left on my direct line. Will return a call back to her.

## 2017-11-10 NOTE — Telephone Encounter (Signed)
Patient' s wife returned a call to me. She requested sleep appointment with Dr Claiborne Billings. December sleep clinic appointment given to patient.

## 2017-11-10 NOTE — Telephone Encounter (Signed)
Called and left message call was returned. Call back if assistance still needed. Direct  Phone line # provided.

## 2017-11-10 NOTE — Telephone Encounter (Signed)
Returned a call to patient's wife. Left message call was returned.

## 2017-11-10 NOTE — Telephone Encounter (Signed)
New Message: ° ° ° °Patient is requesting  a call back  °

## 2017-11-22 ENCOUNTER — Ambulatory Visit: Payer: Medicare Other | Admitting: *Deleted

## 2017-11-22 ENCOUNTER — Ambulatory Visit: Payer: Self-pay | Admitting: Pharmacist

## 2017-11-22 DIAGNOSIS — I4891 Unspecified atrial fibrillation: Secondary | ICD-10-CM

## 2017-11-22 DIAGNOSIS — I4821 Permanent atrial fibrillation: Secondary | ICD-10-CM

## 2017-11-22 DIAGNOSIS — Z7901 Long term (current) use of anticoagulants: Principal | ICD-10-CM

## 2017-11-22 DIAGNOSIS — Z9889 Other specified postprocedural states: Secondary | ICD-10-CM

## 2017-11-22 DIAGNOSIS — Z5181 Encounter for therapeutic drug level monitoring: Secondary | ICD-10-CM | POA: Diagnosis not present

## 2017-11-22 LAB — POCT INR: INR: 2 (ref 2.0–3.0)

## 2017-11-22 NOTE — Patient Instructions (Signed)
Take coumadin 1 tablet tonight then resume 1 tablet daily except 1/2 tablet on Tuesdays  Recheck on 10/29

## 2017-12-01 ENCOUNTER — Ambulatory Visit: Payer: Medicare Other | Admitting: Adult Health

## 2017-12-01 ENCOUNTER — Encounter: Payer: Self-pay | Admitting: Adult Health

## 2017-12-01 VITALS — BP 121/75 | HR 68 | Ht 71.0 in | Wt 243.6 lb

## 2017-12-01 DIAGNOSIS — G459 Transient cerebral ischemic attack, unspecified: Secondary | ICD-10-CM | POA: Diagnosis not present

## 2017-12-01 DIAGNOSIS — G4733 Obstructive sleep apnea (adult) (pediatric): Secondary | ICD-10-CM | POA: Diagnosis not present

## 2017-12-01 DIAGNOSIS — I4821 Permanent atrial fibrillation: Secondary | ICD-10-CM

## 2017-12-01 DIAGNOSIS — I10 Essential (primary) hypertension: Secondary | ICD-10-CM

## 2017-12-01 DIAGNOSIS — E785 Hyperlipidemia, unspecified: Secondary | ICD-10-CM | POA: Diagnosis not present

## 2017-12-01 NOTE — Patient Instructions (Signed)
Continue warfarin daily  and lipitor  for secondary stroke prevention  Continue to follow up with PCP regarding cholesterol and blood pressure management   Follow up with cardiologist Monday to discuss future blood thinner  Continue to stay active and maintain a healthy diet  Continue to wear CPAP mask for sleep apnea managment  Continue to monitor blood pressure at home  Maintain strict control of hypertension with blood pressure goal below 130/90, diabetes with hemoglobin A1c goal below 6.5% and cholesterol with LDL cholesterol (bad cholesterol) goal below 70 mg/dL. I also advised the patient to eat a healthy diet with plenty of whole grains, cereals, fruits and vegetables, exercise regularly and maintain ideal body weight.  Followup in the future with me in 3 months or call earlier if needed       Thank you for coming to see [place] at [facility]. I hope we have been able to provide you high quality care today.  You may receive a patient satisfaction survey over the next few weeks. We would appreciate your feedback and comments so that we may continue to improve ourselves and the health of our patients.

## 2017-12-01 NOTE — Progress Notes (Signed)
[facility] [address] [place]. [place] [address] [phone]       OFFICE FOLLOW UP NOTE  [name] Date of Birth:  [birth year] Medical Record Number:  [id number]   Reason for Referral:  hospital stroke follow up  CHIEF COMPLAINT:  Chief Complaint  Patient presents with  . Follow-up    Hospital Stroke follow up pt seen by Dr.Xu stroke MD at [facility] room in back hallway pt with  [name] his wife     HPI: [name] is being seen today for initial visit in the office for posterior circulation TIA secondary to known AF on 10/24/2017. History obtained from patient, wife and chart review. Reviewed all radiology images and labs personally.  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 fib on Coumadin, hypertension who was admitted for diplopia on left gaze.  INR 2.21 on admission. 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  MRA negative.  2D echo showed an EF 60 to 65%.  Carotid Doppler unremarkable.  LDL 89 and A1c 6.4.  INR 2.13.  Creatinine 0.7. Patient symptoms concerning for posterior circulation TIA.  Given his episode on Coumadin with INR 2.2, recommend either continue Coumadin with INR goal 2.5-3.0 or switch to DOACs such as Eliquis.  Per notes, after discussion, patient decided to continue on Coumadin at this time and he will discuss with his cardiologist as outpatient regarding Eliquis.   It was recommended to continue aspirin 81 mg along with Coumadin until INR level between 2.5 and 3.0 and at that time aspirin can be discontinued.    Patient was discharged home in stable condition  without therapy needs.  Patient is being seen today for hospital follow-up.  He states overall he has been doing well without residual deficits or recurring of symptoms.  He continues to be on Coumadin only but recent INR 2.0.  He will have this rechecked on  12/13/2017 and is followed in Coumadin clinic.  He also continues to take Lipitor without side effects myalgias.  Blood pressure today 121/75.  Patient does have appointment with cardiologist on Monday and they plan on discussing initiation of Eliquis at that time.  No further concerns at this time.  Denies new or worsening stroke/TIA symptoms left leg.     ROS:   14 system review of systems performed and negative with exception of double vision, joint pain and aching muscles  PMH:  Past Medical History:  Diagnosis Date  . Arthritis    knees, shoulder,  hips (10/24/2017)  . Atrial fibrillation ([place])    on Coumadin  . BPH (benign prostatic hyperplasia)   . Coronary artery disease excluded   . Diverticulosis   . Endocarditis, valve unspecified, unspecified cause   . GERD (gastroesophageal reflux disease)   . H/O mitral valve repair    Postoperative ring with postoperative atrial fibrillation  . Heart murmur    hx  . Hiatal hernia 06/13/2002  . High cholesterol   . History of kidney stones    passed spontaneously x2   . OSA on CPAP   . Stroke ([place])   . Unspecified essential hypertension   . Weakness of both arms 07/19/2012    PSH:  Past Surgical History:  Procedure Laterality Date  . CARDIAC CATHETERIZATION  X 2  . CARDIOVERSION N/A 04/14/2016   Procedure: CARDIOVERSION;  Surgeon: Minus Breeding,  MD;  Location: [place];  Service: Cardiovascular;  Laterality: N/A;  . CATARACT EXTRACTION W/PHACO Right 05/12/2015   Procedure: CATARACT EXTRACTION PHACO AND INTRAOCULAR LENS PLACEMENT RIGHT EYE CDE=7.75;  Surgeon: Tonny Branch, MD;  Location: [facility];  Service: Ophthalmology;  Laterality: Right;  . CATARACT EXTRACTION W/PHACO Left 06/12/2015   Procedure: CATARACT EXTRACTION PHACO AND INTRAOCULAR LENS PLACEMENT (IOC);  Surgeon: Tonny Branch, MD;  Location: [facility];  Service: Ophthalmology;  Laterality: Left;  CDE:  13.17  . INJECTION KNEE Right 02/20/2016   Procedure: KNEE INJECTION;  Surgeon:  [name], MD;  Location: [place];  Service: Orthopedics;  Laterality: Right;  . JOINT REPLACEMENT    . KNEE ARTHROSCOPY Left   . LAPAROSCOPIC CHOLECYSTECTOMY    . MITRAL VALVE REPAIR  ~ 2003  . SHOULDER OPEN ROTATOR CUFF REPAIR Left   . TOTAL KNEE ARTHROPLASTY Left 02/20/2016   Procedure: LEFT TOTAL KNEE ARTHROPLASTY WITH RIGHT KNEE INJECTION;  Surgeon: [name], MD;  Location: [place];  Service: Orthopedics;  Laterality: Left;  Marland Kitchen VASECTOMY      Social History:  Social History   Socioeconomic History  . Marital status: Married    Spouse name: [name]  . Number of children: 2  . Years of education: Not on file  . Highest education level: Not on file  Occupational History  . Occupation:  Environmental manager    Comment: RETIRED  Social Needs  . Financial resource strain: Not on file  . Food insecurity:    Worry: Not on file    Inability: Not on file  . Transportation needs:    Medical: Not on file    Non-medical: Not on file  Tobacco Use  . Smoking status: Former Smoker    Packs/day: 3.00    Years: 35.00    Pack years: 105.00    Types: Cigarettes    Last attempt to quit: 02/16/1988    Years since quitting: 29.8  . Smokeless tobacco: Never Used  . Tobacco comment:  Year Quit: 1990   Substance and Sexual Activity  . Alcohol use: No  . Drug use: Never  . Sexual activity: Not Currently  Lifestyle  . Physical activity:    Days per week: Not on file    Minutes per session: Not on file  . Stress: Not on file  Relationships  . Social connections:    Talks on phone: Not on file    Gets together: Not on file    Attends religious service: Not on file    Active member of club or organization: Not on file    Attends meetings of clubs or organizations: Not on file    Relationship status: Not on file  . Intimate partner violence:    Fear of current or ex partner: Not on file    Emotionally abused: Not on file    Physically abused: Not on file    Forced sexual activity: Not  on file  Other Topics Concern  . Not on file  Social History Narrative  . Not on file    Family History:  Family History  Problem Relation Age of Onset  . Congestive Heart Failure Father   . Heart disease Father   . Stroke Brother   . Stroke Sister   . Diabetes Mellitus II Brother   . Diabetes Mellitus II Sister   . Lung cancer Brother   . Colon cancer Neg Hx   . Esophageal cancer Neg Hx   .  Rectal cancer Neg Hx   . Stomach cancer Neg Hx     Medications:   Current Outpatient Medications on File Prior to Visit  Medication Sig Dispense Refill  . acetaminophen (TYLENOL) 500 MG tablet Take 500 mg by mouth as needed for mild pain.     Marland Kitchen amLODipine (NORVASC) 2.5 MG tablet Take 2.5 mg by mouth daily before breakfast.     . atorvastatin (LIPITOR) 10 MG tablet Take 1 tablet (10 mg total) by mouth daily at 6 PM. 30 tablet 1  . Coenzyme Q10 200 MG capsule Take 200 mg by mouth 2 (two) times daily.    Marland Kitchen lisinopril (PRINIVIL,ZESTRIL) 20 MG tablet TAKE 1 TABLET (20 MG TOTAL) BY MOUTH DAILY BEFORE BREAKFAST. (Patient taking differently: Take 20 mg by mouth daily. ) 30 tablet 5  . MULTIPLE VITAMIN PO Take 1 tablet by mouth daily.    Marland Kitchen omeprazole (PRILOSEC) 20 MG capsule Take 20 mg by mouth daily before breakfast.     . warfarin (COUMADIN) 5 MG tablet Take 1/2 to 1 tablet by mouth daily or as directed by coumadin clinic 90 tablet 0   No current facility-administered medications on file prior to visit.     Allergies:   Allergies  Allergen Reactions  . Flomax [Tamsulosin Hcl] Other (See Comments)    Makes him "swimmy headed"     Physical Exam  Vitals:   12/01/17 1438  BP: 121/75  Pulse: 68  Weight: 243 lb 9.6 oz (110.5 kg)  Height: 5\' 11"  (1.803 m)   Body mass index is 33.98 kg/m. No exam data present  General: well developed, well nourished, pleasant elderly Caucasian male, seated, in no evident distress Head: head normocephalic and atraumatic.   Neck: supple with no carotid  or supraclavicular bruits Cardiovascular: regular rate and rhythm, no murmurs Musculoskeletal: no deformity Skin:  no rash/petichiae Vascular:  Normal pulses all extremities  Neurologic Exam Mental Status: Awake and fully alert. Oriented to place and time. Recent and remote memory intact. Attention span, concentration and fund of knowledge appropriate. Mood and affect appropriate.  Cranial Nerves: Fundoscopic exam reveals sharp disc margins. Pupils equal, briskly reactive to light. Extraocular movements full without nystagmus. Visual fields full to confrontation. Hearing intact. Facial sensation intact. Face, tongue, palate moves normally and symmetrically.  Motor: Normal bulk and tone. Normal strength in all tested extremity muscles. Sensory.: intact to touch , pinprick , position and vibratory sensation.  Coordination: Rapid alternating movements normal in all extremities. Finger-to-nose and heel-to-shin performed accurately bilaterally. Gait and Station: Arises from chair without difficulty. Stance is normal. Gait demonstrates normal stride length and balance . Able to heel, toe and tandem walk without difficulty.  Reflexes: 1+ and symmetric. Toes downgoing.    NIHSS  0 Modified Rankin  0 HAS-BLED 6 CHA2DS2-VASc 2   Diagnostic Data (Labs, Imaging, Testing)  CT HEAD WO CONTRAST 10/24/2017 IMPRESSION: 1. No intracranial hemorrhage or CT evidence of large acute infarct. 2. Remote small superior left cerebellar infarct. 3. Chronic microvascular changes. 4. Global atrophy. 5. Polypoid opacification right sphenoid sinus suggestive of inspissated material from changes of chronic sinusitis as noted above.  MR BRAIN WO CONTRAST MR MRA HEAD  10/24/2017 IMPRESSION: MRI HEAD IMPRESSION:  1. No acute intracranial infarct or other abnormality identified. 2. Age-related cerebral atrophy with moderate chronic small vessel ischemic disease. 3. Small remote left cerebellar infarct.  MRA  HEAD IMPRESSION:  Negative intracranial MRA. No large vessel occlusion. No hemodynamically significant or correctable stenosis.  ECHOCARDIOGRAM 10/24/2016 Study Conclusions  - Left ventricle: The cavity size was normal. There was mild   concentric hypertrophy. Systolic function was normal. The   estimated ejection fraction was in the range of 60% to 65%. Wall   motion was normal; there were no regional wall motion   abnormalities. - Aortic valve: Transvalvular velocity was within the normal range.   There was no stenosis. There was no regurgitation. - Mitral valve: Transvalvular velocity was within the normal range.   There was no evidence for stenosis. There was mild regurgitation.   Valve area by continuity equation (using LVOT flow): 0.9 cm^2. - Left atrium: The atrium was severely dilated. - Right ventricle: The cavity size was normal. Wall thickness was   normal. Systolic function was normal. - Tricuspid valve: There was trivial regurgitation. - Pulmonary arteries: Systolic pressure was mildly increased. PA   peak pressure: 41 mm Hg (S).    ASSESSMENT: [name] is a 82 y.o. year old male here with embolic posterior TIA on 10/24/2017 secondary to known PAF on Coumadin. Vascular risk factors include AF on warfarin, MV repair, HTN, HLD and OSA.     PLAN: -Continue warfarin daily  and Lipitor for secondary stroke prevention -F/u with PCP regarding your HLD and HTN management -f/u with cardiologist on Monday to discuss future DO AC along with continued atrial fibrillation management -Advised to continue compliance with CPAP for OSA management -continue to monitor BP at home -advised to continue to stay active and maintain a healthy diet -Maintain strict control of hypertension with blood pressure goal below 130/90, diabetes with hemoglobin A1c goal below 6.5% and cholesterol with LDL cholesterol (bad cholesterol) goal below 70 mg/dL. I also advised the patient to eat a  healthy diet with plenty of whole grains, cereals, fruits and vegetables, exercise regularly and maintain ideal body weight.  Follow up in 3 months or call earlier if needed   Greater than 50% of time during this 25 minute visit was spent on counseling,explanation of diagnosis of TIA, reviewing risk factor management of OSA, PAF, HTN and HLD, planning of further management, discussion with patient and family and coordination of care    Venancio Poisson, AGNP-BC  [facility] [address] [place], [place] [address]  Phone [phone] Fax [phone] Note: This document was prepared with digital dictation and possible smart phrase technology. Any transcriptional errors that result from this process are unintentional.

## 2017-12-04 NOTE — Progress Notes (Signed)
HPI  The patient presents for evaluation of mitral valve repair.  Echo 10/2015 demonstrated stable MV repair.  While in the hospital in 2017 for knee surgery he developed persistent atrial fib.  He had cardioversion but went back into atrial fib.  He wore a Holter monitor which demonstrated frequent 3.5 second pauses at night. His average heart rate was about 80.   He is treated for sleep apnea.   He recently had some double vision and was told he could have had a "small stroke."  His INR was therapeutic. Follow up echo last month was normal.  Doppler demonstrated no significant abnormalities.  I reviewed ED records for this.  He was thought to have an embolic posterior TIA.  He was also seen by neurology and I reviewed these records as well.  He returns for follow up.   He is done relatively well.  He still tired and sleepy but has follow-up of his sleep apnea.  He goes to the gym twice per week.  He had a couple of episodes of blurred vision but his symptoms for which he presented to seem to have resolved predominantly.  He has difficulty swallowing foods but he denies any chest pressure, neck or arm discomfort.  He has no palpitations, presyncope or syncope.  His weights have been up and down.  He has some trace lower extremity edema.   Allergies  Allergen Reactions  . Flomax [Tamsulosin Hcl] Other (See Comments)    Makes him "swimmy headed"    Current Outpatient Medications  Medication Sig Dispense Refill  . acetaminophen (TYLENOL) 500 MG tablet Take 500 mg by mouth as needed for mild pain.     Marland Kitchen amLODipine (NORVASC) 2.5 MG tablet Take 2.5 mg by mouth daily before breakfast.     . atorvastatin (LIPITOR) 10 MG tablet Take 1 tablet (10 mg total) by mouth daily at 6 PM. 30 tablet 1  . Coenzyme Q10 200 MG capsule Take 200 mg by mouth 2 (two) times daily.    Marland Kitchen lisinopril (PRINIVIL,ZESTRIL) 20 MG tablet TAKE 1 TABLET (20 MG TOTAL) BY MOUTH DAILY BEFORE BREAKFAST. (Patient taking differently:  Take 20 mg by mouth daily. ) 30 tablet 5  . MULTIPLE VITAMIN PO Take 1 tablet by mouth daily.    Marland Kitchen omeprazole (PRILOSEC) 20 MG capsule Take 20 mg by mouth daily before breakfast.     . apixaban (ELIQUIS) 5 MG TABS tablet Take 1 tablet (5 mg total) by mouth 2 (two) times daily. 60 tablet 11   No current facility-administered medications for this visit.     Past Medical History:  Diagnosis Date  . Arthritis    knees, shoulder,  hips (10/24/2017)  . Atrial fibrillation ([place])    on Coumadin  . BPH (benign prostatic hyperplasia)   . Coronary artery disease excluded   . Diverticulosis   . Endocarditis, valve unspecified, unspecified cause   . GERD (gastroesophageal reflux disease)   . H/O mitral valve repair    Postoperative ring with postoperative atrial fibrillation  . Heart murmur    hx  . Hiatal hernia 06/13/2002  . High cholesterol   . History of kidney stones    passed spontaneously x2   . OSA on CPAP   . Stroke ([place])   . Unspecified essential hypertension   . Weakness of both arms 07/19/2012    Past Surgical History:  Procedure Laterality Date  . CARDIAC CATHETERIZATION  X 2  . CARDIOVERSION N/A  04/14/2016   Procedure: CARDIOVERSION;  Surgeon: Minus Breeding, MD;  [facility];  Service: Cardiovascular;  Laterality: N/A;  . CATARACT EXTRACTION W/PHACO Right 05/12/2015   Procedure: CATARACT EXTRACTION PHACO AND INTRAOCULAR LENS PLACEMENT RIGHT EYE CDE=7.75;  Surgeon: Tonny Branch, MD;  Location: [facility];  Service: Ophthalmology;  Laterality: Right;  . CATARACT EXTRACTION W/PHACO Left 06/12/2015   Procedure: CATARACT EXTRACTION PHACO AND INTRAOCULAR LENS PLACEMENT (IOC);  Surgeon: Tonny Branch, MD;  Location: [facility];  Service: Ophthalmology;  Laterality: Left;  CDE:  13.17  . INJECTION KNEE Right 02/20/2016   Procedure: KNEE INJECTION;  Surgeon: [name], MD;  Location: [place];  Service: Orthopedics;  Laterality: Right;  . JOINT REPLACEMENT    . KNEE ARTHROSCOPY Left   .  LAPAROSCOPIC CHOLECYSTECTOMY    . MITRAL VALVE REPAIR  ~ 2003  . SHOULDER OPEN ROTATOR CUFF REPAIR Left   . TOTAL KNEE ARTHROPLASTY Left 02/20/2016   Procedure: LEFT TOTAL KNEE ARTHROPLASTY WITH RIGHT KNEE INJECTION;  Surgeon: [name], MD;  Location: [facility];  Service: Orthopedics;  Laterality: Left;  Marland Kitchen VASECTOMY      ROS: Positive for diarrhea, difficulty swallowing.    .Otherwise as stated in the HPI and negative for all other systems.  PHYSICAL EXAM BP 112/68   Pulse 86   Ht 5\' 11"  (1.803 m)   Wt 240 lb 3.2 oz (109 kg)   SpO2 95%   BMI 33.50 kg/m   GENERAL:  Well appearing NECK:  No jugular venous distention, waveform within normal limits, carotid upstroke brisk and symmetric, no bruits, no thyromegaly LUNGS:  Clear to auscultation bilaterally CHEST:  Unremarkable HEART:  PMI not displaced or sustained,S1 and S2 within normal limits, no S3, no S4, no clicks, no rubs, no murmurs ABD:  Flat, positive bowel sounds normal in frequency in pitch, no bruits, no rebound, no guarding, no midline pulsatile mass, no hepatomegaly, no splenomegaly EXT:  2 plus pulses throughout, no edema, no cyanosis no clubbing   EKG:  NA (Atrial fib on 9/10.  EKG from ED reviewed)  ASSESSMENT AND PLAN  ATRIAL FIB:    [name] has a CHA2DS2 - VASc of 5.  He has not noticed this.  It was suggested that he might need treatment with a DOAC. given the fact that he had a probable embolic TIA on warfarin and he is been therapeutic.   I will switch him to Eliquis 5 mg twice daily.    SLEEP APNEA:   He has follow up with Dr. Claiborne Billings in Dec.    MITRAL VALVE REPAIR:    He had mild MR on echo last month.    HTN:  The blood pressure is at target.  No change in therapy.   OBESITY:    We have discussed this again today.

## 2017-12-05 ENCOUNTER — Ambulatory Visit: Payer: Medicare Other | Admitting: Cardiology

## 2017-12-05 ENCOUNTER — Encounter: Payer: Self-pay | Admitting: Cardiology

## 2017-12-05 VITALS — BP 112/68 | HR 86 | Ht 71.0 in | Wt 240.2 lb

## 2017-12-05 DIAGNOSIS — G459 Transient cerebral ischemic attack, unspecified: Secondary | ICD-10-CM | POA: Diagnosis not present

## 2017-12-05 DIAGNOSIS — Z9889 Other specified postprocedural states: Secondary | ICD-10-CM

## 2017-12-05 DIAGNOSIS — I10 Essential (primary) hypertension: Secondary | ICD-10-CM | POA: Diagnosis not present

## 2017-12-05 DIAGNOSIS — I482 Chronic atrial fibrillation, unspecified: Secondary | ICD-10-CM | POA: Diagnosis not present

## 2017-12-05 MED ORDER — APIXABAN 5 MG PO TABS: 5.0000 mg | ORAL_TABLET | Freq: Two times a day (BID) | ORAL | 11 refills | Status: DC

## 2017-12-05 NOTE — Progress Notes (Signed)
I agree with the above plan 

## 2017-12-05 NOTE — Patient Instructions (Signed)
Medication Instructions:  STOP- Warfarin START- Eliquis 5 mg twice a day  If you need a refill on your cardiac medications before your next appointment, please call your pharmacy.  Labwork: None Ordered   If you have labs (blood work) drawn today and your tests are completely normal, you will receive your results only by: Marland Kitchen MyChart Message (if you have MyChart) OR . A paper copy in the mail If you have any lab test that is abnormal or we need to change your treatment, we will call you to review the results.  Testing/Procedures: None Ordered  Follow-Up: You will need a follow up appointment in 1 Year.  Please call our office 2 months in advance([phone]) to schedule the appointment.  You may see  DR Percival Spanish or one of the following Advanced Practice Providers on your designated Care Team:   . Jory Sims, DNP, ANP . Rhonda Barrett, PA-C .  [place] Kitchen Kerin Ransom, PA-C . Daleen Snook Kroeger, PA-C . Sande Rives, PA-C .  [place] Kitchen Almyra Deforest, PA-C . Fabian Sharp, PA-C  At [facility], you and your health needs are our priority.  As part of our continuing mission to provide you with exceptional heart care, we have created designated Provider Care Teams.  These Care Teams include your primary Cardiologist (physician) and Advanced Practice Providers (APPs -  Physician Assistants and Nurse Practitioners) who all work together to provide you with the care you need, when you need it.   Thank you for choosing [facility]!!

## 2017-12-13 ENCOUNTER — Ambulatory Visit: Payer: Medicare Other | Admitting: *Deleted

## 2017-12-13 DIAGNOSIS — Z5181 Encounter for therapeutic drug level monitoring: Secondary | ICD-10-CM

## 2017-12-13 DIAGNOSIS — I639 Cerebral infarction, unspecified: Secondary | ICD-10-CM | POA: Diagnosis not present

## 2017-12-13 LAB — POCT INR: INR: 2.6 (ref 2.0–3.0)

## 2017-12-13 NOTE — Patient Instructions (Signed)
Pt is switching to Eliquis from Warfarin Stop coumadin today.  Start Eliquis 5mg  twice daily on 12/15/17 Follow up in 1 month with labs

## 2018-01-13 ENCOUNTER — Other Ambulatory Visit: Payer: Self-pay | Admitting: Cardiology

## 2018-01-16 ENCOUNTER — Other Ambulatory Visit (HOSPITAL_COMMUNITY)
Admission: RE | Admit: 2018-01-16 | Discharge: 2018-01-16 | Disposition: A | Discharge status: Home / Self Care | Payer: Medicare Other | Source: Ambulatory Visit | Attending: Cardiology | Admitting: Cardiology

## 2018-01-16 DIAGNOSIS — Z5181 Encounter for therapeutic drug level monitoring: Secondary | ICD-10-CM | POA: Insufficient documentation

## 2018-01-16 DIAGNOSIS — I639 Cerebral infarction, unspecified: Secondary | ICD-10-CM | POA: Insufficient documentation

## 2018-01-16 LAB — CBC
HEMATOCRIT: 45.7 % (ref 39.0–52.0)
HEMOGLOBIN: 15.2 g/dL (ref 13.0–17.0)
MCH: 31.9 pg (ref 26.0–34.0)
MCHC: 33.3 g/dL (ref 30.0–36.0)
MCV: 96 fL (ref 80.0–100.0)
Platelets: 239 10*3/uL (ref 150–400)
RBC: 4.76 MIL/uL (ref 4.22–5.81)
RDW: 12.6 % (ref 11.5–15.5)
WBC: 6.5 10*3/uL (ref 4.0–10.5)
nRBC: 0 % (ref 0.0–0.2)

## 2018-01-16 LAB — BASIC METABOLIC PANEL
Anion gap: 7 (ref 5–15)
BUN: 14 mg/dL (ref 8–23)
CHLORIDE: 105 mmol/L (ref 98–111)
CO2: 27 mmol/L (ref 22–32)
Calcium: 8.6 mg/dL — ABNORMAL LOW (ref 8.9–10.3)
Creatinine, Ser: 0.71 mg/dL (ref 0.61–1.24)
GFR calc non Af Amer: >60 mL/min (ref >60)
GLUCOSE: 127 mg/dL — AB (ref 70–99)
Potassium: 3.8 mmol/L (ref 3.5–5.1)
Sodium: 139 mmol/L (ref 135–145)

## 2018-01-17 ENCOUNTER — Ambulatory Visit: Payer: Medicare Other | Admitting: Gastroenterology

## 2018-01-17 ENCOUNTER — Encounter: Payer: Self-pay | Admitting: Gastroenterology

## 2018-01-17 ENCOUNTER — Ambulatory Visit: Payer: Medicare Other | Admitting: Cardiovascular Disease

## 2018-01-17 VITALS — BP 118/72 | HR 66 | Ht 71.0 in | Wt 245.1 lb

## 2018-01-17 DIAGNOSIS — R159 Full incontinence of feces: Secondary | ICD-10-CM | POA: Diagnosis not present

## 2018-01-17 DIAGNOSIS — R197 Diarrhea, unspecified: Secondary | ICD-10-CM | POA: Diagnosis not present

## 2018-01-17 DIAGNOSIS — R1310 Dysphagia, unspecified: Secondary | ICD-10-CM | POA: Diagnosis not present

## 2018-01-17 DIAGNOSIS — K219 Gastro-esophageal reflux disease without esophagitis: Secondary | ICD-10-CM

## 2018-01-17 NOTE — Progress Notes (Signed)
    History of Present Illness: This is a 82 year old male with diarrhea, incontinence, rectal pain. He was evaluated for the same in June 2018.  He has normal bowel movements that vary with softer stools and occasionally liquid stools.  He has eliminated several things in his diet however the variation persists.  Incontinence occurs with liquid and softer stools only.  After incontinence he will have mild burning rectal pain that resolves after cleaning himself.  Diarrhea improved without artificial sweeteners.  He notes increased intestinal gas.  Decreased anal tone was noted on DRE both resting and squeeze in 2018. Anoscopy showed hemorrhoids in 2018. He was hospitalized with a brainstem infarction in September.  He is maintained on Eliquis.   He relates problems with GERD with occasional breakthrough symptoms related to spicy foods.  He has had intermittent difficulties with stress swallowing solids and liquids in the past however none recently.  Barium esophagram in 2015 showed presbyesophagus without esophageal stricture.  Current Medications, Allergies, Past Medical History, Past Surgical History, Family History and Social History were reviewed in [employer] record.  Physical Exam: General: Well developed, well nourished, obese, BMI=34, no acute distress Head: Normocephalic and atraumatic Eyes:  sclerae anicteric, EOMI Ears: Normal auditory acuity Mouth: No deformity or lesions Lungs: Clear throughout to auscultation Heart: Regular rate and rhythm; no murmurs, rubs or bruits Abdomen: Soft, non tender and non distended. No masses, hepatosplenomegaly or hernias noted. Normal Bowel sounds Rectal: Not done Musculoskeletal: Symmetrical with no gross deformities  Pulses:  Normal pulses noted Extremities: No clubbing, cyanosis, edema or deformities noted Neurological: Alert oriented x 4, grossly nonfocal Psychological:  Alert and cooperative. Normal mood and  affect   Assessment and Recommendations:  1.  Normal stools alternating with looser stools and occasional diarrhea.  Looser stools and diarrhea are sometimes associated with incontinence.  Previously documented decreased anal sphincter tone.  Recent brainstem infarction.  Rectal pain follows incontinence and resolves after cleaning.  Avoid foods that lead to looser stools.  Trial of Citrucel daily.  If his stools are not firmer with Critrucel take Imodium 1-2 twice daily as needed to target for firmer stools.  Gas-X 4 times daily as needed.  2.  GERD.  Follow standard antireflux measures and continue omeprazole 20 mg daily.  Intermittent dysphagia however this has not been active recently.  Barium esophagram in 2015 showed presbyesophagus without stricture.  He is advised to call if his symptoms worsen.

## 2018-01-17 NOTE — Patient Instructions (Signed)
Start over the counter Citracel daily.  You can also take Imodium 2 capsule by mouth twice daily to help control your soft stools.  For gas and bloating you can take over the counter Gas-X four times a day as needed.   Call our office back if you are still having problems.   Thank you for choosing me and [facility].  Pricilla Riffle. Dagoberto Ligas., MD., [facility]

## 2018-01-24 ENCOUNTER — Ambulatory Visit: Payer: Medicare Other | Admitting: *Deleted

## 2018-01-24 DIAGNOSIS — Z7901 Long term (current) use of anticoagulants: Secondary | ICD-10-CM

## 2018-01-24 DIAGNOSIS — I4819 Other persistent atrial fibrillation: Secondary | ICD-10-CM | POA: Diagnosis not present

## 2018-01-24 DIAGNOSIS — I639 Cerebral infarction, unspecified: Secondary | ICD-10-CM

## 2018-01-24 DIAGNOSIS — Z5181 Encounter for therapeutic drug level monitoring: Secondary | ICD-10-CM

## 2018-01-24 NOTE — Progress Notes (Signed)
Pt was started on Eliquis 5mg  twice daily for atrial fibrillation on 12/15/17.    He denies any problems since starting Eliquis.  He denies any bleeding, increased bruising or GI upset.  Reviewed patients medication list.  Pt is not currently on any combined P-gp and strong CYP3A4 inhibitors/inducers (ketoconazole, traconazole, ritonavir, carbamazepine, phenytoin, rifampin, St. John's wort).  Reviewed labs from 01/16/18.  SCr 0.71,  Weight 107.7kg, CrCl 126.41.  Dose is appropriate based on age, weight, and SCr.  Hgb and HCT: 15.2/45.7  Labs are stable when compared to labs of 10/24/17 before starting Eliquis.  A full discussion of the nature of anticoagulants has been carried out.  A benefit/risk analysis has been presented to the patient, so that they understand the justification for choosing anticoagulation with Eliquis at this time.  The need for compliance is stressed.  Pt is aware to take the medication twice daily.  Side effects of potential bleeding are discussed, including unusual colored urine or stools, coughing up blood or coffee ground emesis, nose bleeds or serious fall or head trauma.  Discussed signs and symptoms of stroke. The patient should avoid any OTC items containing aspirin or ibuprofen.  Avoid alcohol consumption.   Call if any signs of abnormal bleeding.  Discussed financial obligations and resolved any difficulty in obtaining medication.    Results of lab work from 01/16/18 discussed with patient and wife.  6 month follow up appointment made for 08/01/18.  Orders for lab work week on 07/25/18 given to wife.  Pt will have drawn at [facility].

## 2018-02-02 ENCOUNTER — Encounter: Payer: Self-pay | Admitting: Cardiovascular Disease

## 2018-02-02 ENCOUNTER — Ambulatory Visit: Payer: Medicare Other | Admitting: Cardiovascular Disease

## 2018-02-02 VITALS — BP 128/76 | HR 60 | Ht 71.0 in | Wt 245.0 lb

## 2018-02-02 DIAGNOSIS — I4819 Other persistent atrial fibrillation: Secondary | ICD-10-CM

## 2018-02-02 DIAGNOSIS — Z9889 Other specified postprocedural states: Secondary | ICD-10-CM

## 2018-02-02 DIAGNOSIS — I10 Essential (primary) hypertension: Secondary | ICD-10-CM

## 2018-02-02 DIAGNOSIS — G4733 Obstructive sleep apnea (adult) (pediatric): Secondary | ICD-10-CM

## 2018-02-02 NOTE — Progress Notes (Signed)
Cardiology Office Note    Date:  02/02/2018   ID:  [name], DOB 04-06-1935, MRN [id number]  PCP:  Dione Housekeeper, MD  Cardiologist:  Shelva Majestic, MD (sleep); Dr. Percival Spanish  F/U sleep clinic evaluation  History of Present Illness:  [name] is a 82 y.o. male who presents for a one-year follow-up sleep clinic evaluation following initiation of CPAP therapy for sleep apnea.  [name] is followed by Dr. Percival Spanish for primary cardiology care.  He has a history of mitral valve repair as well as atrial fibrillation.  He had undergone DC cardioversion, but subsequently, he developed recurrent AF.  He was referred for sleep study.  Due to concerns for sleep apnea.  He was found to have severe obstructive sleep apnea with an AHI of 55.4 per hour.  AHI during REM sleep was 57.5 per hour.  He had significant oxygen desaturation to 80% with non-REM and 72%.  During rems sleep.  There was loud snoring.  CPAP was implemented and was titrated to an optimal pressure of 14 cm.  His CPAP set up date was 08/23/2016 and he has a ResMed air since 10 AutoSet unit.  He has been using a ResMed airFit F 20 medium size mask.  A download was obtained from August 26 2 11/08/2016.  His compliance is excellent at 100%.  He is averaging 8 hours and 39 minutes per night of sleep.  At a set pressure of 14 cm, however, AHI was still mildly elevated at 6.5, with an apnea index of 5.6.  I saw him for initial sleep clinic evaluation in September 2018 at which time he had noted a significant improvement since CPAP initiation.  Previously he had experienced loud snoring and nocturia at least 3-4 times per night.  He now is unaware of any snoring.  Most nights he can sleep without urination but at times he wakes up 1 time per night.  He feels more refreshed.  His sleep is restorative.  He denies any daytime sleepiness,  bruxism, restless legs, hypnogognic hallucinations, or cataplexy.  An Epworth Sleepiness Scale score  was calculated in the office   endorsed at 8 shown below.  Epworth Sleepiness Scale: Situation   Chance of Dozing/Sleeping (0 = never , 1 = slight chance , 2 = moderate chance , 3 = high chance )   sitting and reading 1   watching TV 3   sitting inactive in a public place 0   being a passenger in a motor vehicle for an hour or more 0   lying down in the afternoon 2   sitting and talking to someone 0   sitting quietly after lunch (no alcohol) 2   while stopped for a few minutes in traffic as the driver 0   Total Score  8   Over the past year, [name] has continued to derive benefit from CPAP therapy.  He typically goes to bed around 10 PM but often times turns the TV off at 11 PM.  He typically wakes up between 7 or 8 AM.  He believes he is sleeping well.  If situations permit he does take a daily nap.  A new Epworth scale was calculated in the office today and this endorsed at 6.  At times he has had some difficulty with the pressure on the bridge of his nose from his full facemask.  He presents for evaluation.  Past Medical History:  Diagnosis Date  . Arthritis  knees, shoulder,  hips (10/24/2017)  . Atrial fibrillation ([place])    on Coumadin  . BPH (benign prostatic hyperplasia)   . Coronary artery disease excluded   . Diverticulosis   . Endocarditis, valve unspecified, unspecified cause   . GERD (gastroesophageal reflux disease)   . H/O mitral valve repair    Postoperative ring with postoperative atrial fibrillation  . Heart murmur    hx  . Hiatal hernia 06/13/2002  . High cholesterol   . History of kidney stones    passed spontaneously x2   . OSA on CPAP   . Stroke ([place])   . Unspecified essential hypertension   . Weakness of both arms 07/19/2012    Past Surgical History:  Procedure Laterality Date  . CARDIAC CATHETERIZATION  X 2  . CARDIOVERSION N/A 04/14/2016   Procedure: CARDIOVERSION;  Surgeon: Minus Breeding, MD;  Location: [facility] ENDOSCOPY;  Service: Cardiovascular;   Laterality: N/A;  . CATARACT EXTRACTION W/PHACO Right 05/12/2015   Procedure: CATARACT EXTRACTION PHACO AND INTRAOCULAR LENS PLACEMENT RIGHT EYE CDE=7.75;  Surgeon: Tonny Branch, MD;  Location: [facility] ORS;  Service: Ophthalmology;  Laterality: Right;  . CATARACT EXTRACTION W/PHACO Left 06/12/2015   Procedure: CATARACT EXTRACTION PHACO AND INTRAOCULAR LENS PLACEMENT (IOC);  Surgeon: Tonny Branch, MD;  Location: [facility] ORS;  Service: Ophthalmology;  Laterality: Left;  CDE:  13.17  . INJECTION KNEE Right 02/20/2016   Procedure: KNEE INJECTION;  Surgeon: [name], MD;  Location: [place];  Service: Orthopedics;  Laterality: Right;  . JOINT REPLACEMENT    . KNEE ARTHROSCOPY Left   . LAPAROSCOPIC CHOLECYSTECTOMY    . MITRAL VALVE REPAIR  ~ 2003  . SHOULDER OPEN ROTATOR CUFF REPAIR Left   . TOTAL KNEE ARTHROPLASTY Left 02/20/2016   Procedure: LEFT TOTAL KNEE ARTHROPLASTY WITH RIGHT KNEE INJECTION;  Surgeon: [name], MD;  Location: [place];  Service: Orthopedics;  Laterality: Left;  Marland Kitchen VASECTOMY      Current Medications: Outpatient Medications Prior to Visit  Medication Sig Dispense Refill  . acetaminophen (TYLENOL) 500 MG tablet Take 500 mg by mouth as needed for mild pain.     Marland Kitchen amLODipine (NORVASC) 2.5 MG tablet Take 2.5 mg by mouth daily before breakfast.     . apixaban (ELIQUIS) 5 MG TABS tablet Take 1 tablet (5 mg total) by mouth 2 (two) times daily. 60 tablet 11  . Coenzyme Q10 200 MG capsule Take 200 mg by mouth 2 (two) times daily.    Marland Kitchen lisinopril (PRINIVIL,ZESTRIL) 20 MG tablet Take 1 tablet (20 mg total) by mouth daily. 90 tablet 3  . MULTIPLE VITAMIN PO Take 1 tablet by mouth daily.    Marland Kitchen omeprazole (PRILOSEC) 20 MG capsule Take 20 mg by mouth daily before breakfast.      No facility-administered medications prior to visit.      Allergies:   Flomax [tamsulosin hcl]   Social History   Socioeconomic History  . Marital status: Married    Spouse name: GLORIA  . Number of children: 2  . Years  of education: Not on file  . Highest education level: Not on file  Occupational History  . Occupation:  Environmental manager    Comment: RETIRED  Social Needs  . Financial resource strain: Not on file  . Food insecurity:    Worry: Not on file    Inability: Not on file  . Transportation needs:    Medical: Not on file    Non-medical: Not on file  Tobacco Use  . Smoking status: Former Smoker    Packs/day: 3.00    Years: 35.00    Pack years: 105.00    Types: Cigarettes    Last attempt to quit: 02/16/1988    Years since quitting: 29.9  . Smokeless tobacco: Never Used  . Tobacco comment:  Year Quit: 1990   Substance and Sexual Activity  . Alcohol use: No  . Drug use: Never  . Sexual activity: Not Currently  Lifestyle  . Physical activity:    Days per week: Not on file    Minutes per session: Not on file  . Stress: Not on file  Relationships  . Social connections:    Talks on phone: Not on file    Gets together: Not on file    Attends religious service: Not on file    Active member of club or organization: Not on file    Attends meetings of clubs or organizations: Not on file    Relationship status: Not on file  Other Topics Concern  . Not on file  Social History Narrative  . Not on file     Family History:  The patient's family history includes Congestive Heart Failure in his father; Diabetes Mellitus II in his brother and sister; Heart disease in his father; Lung cancer in his brother; Stroke in his brother and sister.   ROS General: Negative; No fevers, chills, or night sweats;  HEENT: Negative; No changes in vision or hearing, sinus congestion, difficulty swallowing Pulmonary: Negative; No cough, wheezing, shortness of breath, hemoptysis Cardiovascular: Status post mitral valve repair.  Atrial fibrillation GI: Negative; No nausea, vomiting, diarrhea, or abdominal pain GU: Negative; No dysuria, hematuria, or difficulty voiding Musculoskeletal: Negative; no myalgias,  joint pain, or weakness Hematologic/Oncology: Negative; no easy bruising, bleeding Endocrine: Negative; no heat/cold intolerance; no diabetes Neuro: Negative; no changes in balance, headaches Skin: Negative; No rashes or skin lesions Psychiatric: Negative; No behavioral problems, depression Sleep: See history of present illness  Other comprehensive 14 point system review is negative.   PHYSICAL EXAM:   VS:  BP 128/76   Pulse 60   Ht '5\' 11"'  (1.803 m)   Wt 245 lb (111.1 kg)   BMI 34.17 kg/m     Wt Readings from Last 3 Encounters:  02/02/18 245 lb (111.1 kg)  01/17/18 245 lb 2 oz (111.2 kg)  12/05/17 240 lb 3.2 oz (109 kg)    General: Alert, oriented, no distress.  Skin: normal turgor, no rashes, warm and dry HEENT: Normocephalic, atraumatic. Pupils equal round and reactive to light; sclera anicteric; extraocular muscles intact;  Nose without nasal septal hypertrophy Mouth/Parynx benign; Mallinpatti scale 3 Neck: No JVD, no carotid bruits; normal carotid upstroke Lungs: clear to ausculatation and percussion; no wheezing or rales Chest wall: without tenderness to palpitation Heart: PMI not displaced, regular irregular rhythm with a rate in the 60s, s1 s2 normal, 1/6 systolic murmur, no diastolic murmur, no rubs, gallops, thrills, or heaves Abdomen: Mild diastases recti.  Soft, nontender; no hepatosplenomehaly, BS+; abdominal aorta nontender and not dilated by palpation. Back: no CVA tenderness Pulses 2+ Musculoskeletal: full range of motion, normal strength, no joint deformities Extremities: no clubbing cyanosis or edema, Homan's sign negative  Neurologic: grossly nonfocal; Cranial nerves grossly wnl Psychologic: Normal mood and affect   Studies/Labs Reviewed:   EKG:  EKG is not ordered today.  T  Septemper 2018 ECG (independently read by me):  Atrial fibrillation at 63 bpm.  QTc interval 407  msec  Recent Labs: BMP Latest Ref Rng & Units 01/16/2018 10/24/2017 10/24/2017    Glucose 70 - 99 mg/dL 127(H) 119(H) 124(H)  BUN 8 - 23 mg/dL '14 17 14  ' Creatinine 0.61 - 1.24 mg/dL 0.71 0.70 0.78  BUN/Creat Ratio 10 - 24 - - -  Sodium 135 - 145 mmol/L 139 141 140  Potassium 3.5 - 5.1 mmol/L 3.8 4.1 4.1  Chloride 98 - 111 mmol/L 105 103 104  CO2 22 - 32 mmol/L 27 - 28  Calcium 8.9 - 10.3 mg/dL 8.6(L) - 8.6(L)     Hepatic Function Latest Ref Rng & Units 10/24/2017 02/11/2016 06/25/2011  Total Protein 6.5 - 8.1 g/dL 7.2 7.4 7.0  Albumin 3.5 - 5.0 g/dL 4.1 4.5 4.0  AST 15 - 41 U/L '22 19 16  ' ALT 0 - 44 U/L 18 15(L) 13  Alk Phosphatase 38 - 126 U/L 68 71 84  Total Bilirubin 0.3 - 1.2 mg/dL 0.9 1.1 0.3    CBC Latest Ref Rng & Units 01/16/2018 10/24/2017 10/24/2017  WBC 4.0 - 10.5 K/uL 6.5 - 6.9  Hemoglobin 13.0 - 17.0 g/dL 15.2 15.6 15.9  Hematocrit 39.0 - 52.0 % 45.7 46.0 47.2  Platelets 150 - 400 K/uL 239 - 225   Lab Results  Component Value Date   MCV 96.0 01/16/2018   MCV 94.8 10/24/2017   MCV 93 04/08/2016   Lab Results  Component Value Date   TSH 1.447 10/24/2017   Lab Results  Component Value Date   HGBA1C 6.4 (H) 10/25/2017     BNP No results found for: BNP  ProBNP No results found for: PROBNP   Lipid Panel     Component Value Date/Time   CHOL 162 10/25/2017 0354   TRIG 186 (H) 10/25/2017 0354   HDL 36 (L) 10/25/2017 0354   CHOLHDL 4.5 10/25/2017 0354   VLDL 37 10/25/2017 0354   LDLCALC 89 10/25/2017 0354     RADIOLOGY: No results found.   Additional studies/ records that were reviewed today include:  I reviewed the records from Dr. Percival Spanish.  I reviewed his prior sleep study and obtain a download to assess efficacy and compliance.  ASSESSMENT:    1. OSA (obstructive sleep apnea)   2. Persistent atrial fibrillation   3. Essential hypertension   4. H/O mitral valve repair    PLAN:  [name] is a very pleasant 82 year old gentleman who has cardiovascular comorbidities including mitral valve repair, atrial fibrillation,  hypertension, and mild carotid plaque.  He had undergone cardioversion for atrial fibrillation, but unfortunately develop recurrent atrial fibrillation. He was found to have severe obstructive sleep apnea on the baseline portion of his split-night sleep evaluation with significant oxygen desaturation to a nadir of 72% during REM sleep.  He has been on CPAP therapy since his set up date of August 23, 2016.  Over the past year he has continued to feel well with CPAP therapy.  He has been using a full facemask air fit F 20 medium size.  Recently, he has begun to notice some tenderness to the bridge of his nose.  A download was obtained from November 18 through January 31, 2018.  His compliance continues to be excellent and he is averaging 8 hours and 24 minutes of CPAP use per night.  He has a minimum pressure set at 12 with a maximum of 20.  AHI is 6.2.  There is no significant leak.  Upon further questioning it appears his ramp start up  is set at 6.  I am changing his ramp start up to 10 with an auto mode.  I will also increase his minimum CPAP pressure from 14 up to a maximum pressure of 20.  He has been requiring near maximal pressure with his 95th percentile pressure at 19.6.  With the calluses of the bridge of the nose secondary to his full facemask, when he is due for new mask I have suggested a trial of ResMed air fit F 30 mask which will provide full facemask support the nasal portion will be of Dream[employer]ear technology.  We will obtain a new download in 1 month.  He continues to do well with therapy.  I will see him in 1 year for reevaluation.  Medication Adjustments/Labs and Tests Ordered: Current medicines are reviewed at length with the patient today.  Concerns regarding medicines are outlined above.  Medication changes, Labs and Tests ordered today are listed in the Patient Instructions below. Patient Instructions  Follow-Up: At [facility], you and your health needs are our priority.  As part of our  continuing mission to provide you with exceptional heart care, we have created designated Provider Care Teams.  These Care Teams include your primary Cardiologist (physician) and Advanced Practice Providers (APPs -  Physician Assistants and Nurse Practitioners) who all work together to provide you with the care you need, when you need it. . 1 year with Dr. Claiborne Billings (sleep clinic)      Signed, Shelva Majestic, MD  02/02/2018 9:26 AM    [facility] [address], [address], [place], [place]  [address] Phone: [phone]

## 2018-02-02 NOTE — Patient Instructions (Signed)
Follow-Up: At [facility], you and your health needs are our priority.  As part of our continuing mission to provide you with exceptional heart care, we have created designated Provider Care Teams.  These Care Teams include your primary Cardiologist (physician) and Advanced Practice Providers (APPs -  Physician Assistants and Nurse Practitioners) who all work together to provide you with the care you need, when you need it. . 1 year with Dr. Kelly (sleep clinic)    

## 2018-03-09 ENCOUNTER — Ambulatory Visit: Payer: Medicare Other | Admitting: Adult Health

## 2018-03-15 ENCOUNTER — Ambulatory Visit: Payer: Medicare Other | Admitting: Adult Health

## 2018-03-15 ENCOUNTER — Encounter: Payer: Self-pay | Admitting: Adult Health

## 2018-03-15 VITALS — BP 121/66 | HR 55 | Ht 71.0 in | Wt 242.4 lb

## 2018-03-15 DIAGNOSIS — G459 Transient cerebral ischemic attack, unspecified: Secondary | ICD-10-CM

## 2018-03-15 DIAGNOSIS — H532 Diplopia: Secondary | ICD-10-CM

## 2018-03-15 DIAGNOSIS — G4733 Obstructive sleep apnea (adult) (pediatric): Secondary | ICD-10-CM

## 2018-03-15 DIAGNOSIS — I10 Essential (primary) hypertension: Secondary | ICD-10-CM | POA: Diagnosis not present

## 2018-03-15 DIAGNOSIS — E785 Hyperlipidemia, unspecified: Secondary | ICD-10-CM

## 2018-03-15 DIAGNOSIS — I4821 Permanent atrial fibrillation: Secondary | ICD-10-CM

## 2018-03-15 NOTE — Patient Instructions (Signed)
Continue Eliquis (apixaban) daily  and lipitor  for secondary stroke prevention  Referral placed to neuro ophthalmology due to continued episodes of double vision  Start to monitor visual episodes more to assess for possible dizziness   Continue to follow up with PCP regarding cholesterol and blood pressure management   Continue to monitor blood pressure at home  Maintain strict control of hypertension with blood pressure goal below 130/90, diabetes with hemoglobin A1c goal below 6.5% and cholesterol with LDL cholesterol (bad cholesterol) goal below 70 mg/dL. I also advised the patient to eat a healthy diet with plenty of whole grains, cereals, fruits and vegetables, exercise regularly and maintain ideal body weight.  Followup in the future with me in 6 months or call earlier if needed       Thank you for coming to see [place] at [facility]. I hope we have been able to provide you high quality care today.  You may receive a patient satisfaction survey over the next few weeks. We would appreciate your feedback and comments so that we may continue to improve ourselves and the health of our patients.

## 2018-03-15 NOTE — Progress Notes (Signed)
[facility] [address] [place]. [place] [address] [phone]       OFFICE FOLLOW UP NOTE  [name] Date of Birth:  09-21-1935 Medical Record Number:  [id number]   Reason for Referral:  hospital stroke follow up  CHIEF COMPLAINT:  Chief Complaint  [name] presents with  . Follow-up    3 month follow up. Wife present. Treatment room. No new concerns at this time.     HPI:  03/15/18  [name] is being seen today in the office for TIA follow-up on 10/24/2017 and is accompanied by his wife.  Overall, [name] continues to do well from a stroke standpoint with intermittent diplopia but resolves quickly. [name] continues to follow with ophthalmology. [name] will occassionally use eye patch if needed. [name] states [name] has had approx 2-3 episodes since prior visit. Denies headache, and is unsure if [name] feels dizzy but does state [name] just feels "weird". [name] did have an appointment with cardiology who decided to discontinue warfarin and start Eliquis for improved stroke prevention and atrial fibrillation management and denies side effects of bleeding or bruising.  [name] continues on atorvastatin without side effects myalgias.  Blood pressure today satisfactory at 121/66.  [name] endorses compliance with CPAP for OSA management.  Denies new or worsening stroke/TIA symptoms.  History summary:  [name] is being seen today for initial visit in the office for posterior circulation TIA secondary to known AF on 10/24/2017. History obtained from [name], wife and chart review. Reviewed all radiology images and labs personally.  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 fib on Coumadin, hypertension who was admitted for diplopia on left gaze.  INR 2.21 on admission. 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  MRA negative.  2D echo showed an EF 60 to 65%.  Carotid Doppler unremarkable.   LDL 89 and A1c 6.4.  INR 2.13.  Creatinine 0.7. [name] symptoms concerning for posterior circulation TIA.  Given his episode on Coumadin with INR 2.2, recommend either continue Coumadin with INR goal 2.5-3.0 or switch to DOACs such as Eliquis.  Per notes, after discussion, [name] decided to continue on Coumadin at this time and [name] will discuss with his cardiologist as outpatient regarding Eliquis.   It was recommended to continue aspirin 81 mg along with Coumadin until INR level between 2.5 and 3.0 and at that time aspirin can be discontinued.    [name] was discharged home in stable condition  without therapy needs.  [name] is being seen today for hospital follow-up.  [name] states overall [name] has been doing well without residual deficits or recurring of symptoms.  [name] continues to be on Coumadin only but recent INR 2.0.  [name] will have this rechecked on 12/13/2017 and is followed in Coumadin clinic.  [name] also continues to take Lipitor without side effects myalgias.  Blood pressure today 121/75.  [name] does have appointment with cardiologist on Monday and they plan on discussing initiation of Eliquis at that time.  No further concerns at this time.  Denies new or worsening stroke/TIA symptoms left leg.     ROS:   14 system review of systems performed and negative with exception of double vision, joint pain, ecchymosis, neck pain, neck stiffness, numbness and bruise easily  PMH:  Past Medical History:  Diagnosis Date  . Arthritis    knees, shoulder,  hips (10/24/2017)  .  Atrial fibrillation ([place])    on Coumadin  . BPH (benign prostatic hyperplasia)   . Coronary artery disease excluded   . Diverticulosis   . Endocarditis, valve unspecified, unspecified cause   . GERD (gastroesophageal reflux disease)   . H/O mitral valve repair    Postoperative ring with postoperative atrial fibrillation  . Heart murmur    hx  . Hiatal hernia 06/13/2002  . High cholesterol   . History of kidney stones    passed  spontaneously x2   . OSA on CPAP   . Stroke ([place])   . Unspecified essential hypertension   . Weakness of both arms 07/19/2012    PSH:  Past Surgical History:  Procedure Laterality Date  . CARDIAC CATHETERIZATION  X 2  . CARDIOVERSION N/A 04/14/2016   Procedure: CARDIOVERSION;  Surgeon: Minus Breeding, MD;  Location: [facility] ENDOSCOPY;  Service: Cardiovascular;  Laterality: N/A;  . CATARACT EXTRACTION W/PHACO Right 05/12/2015   Procedure: CATARACT EXTRACTION PHACO AND INTRAOCULAR LENS PLACEMENT RIGHT EYE CDE=7.75;  Surgeon: Tonny Branch, MD;  Location: [facility];  Service: Ophthalmology;  Laterality: Right;  . CATARACT EXTRACTION W/PHACO Left 06/12/2015   Procedure: CATARACT EXTRACTION PHACO AND INTRAOCULAR LENS PLACEMENT (IOC);  Surgeon: Tonny Branch, MD;  Location: [facility];  Service: Ophthalmology;  Laterality: Left;  CDE:  13.17  . INJECTION KNEE Right 02/20/2016   Procedure: KNEE INJECTION;  Surgeon: [name], MD;  Location: [facility];  Service: Orthopedics;  Laterality: Right;  . JOINT REPLACEMENT    . KNEE ARTHROSCOPY Left   . LAPAROSCOPIC CHOLECYSTECTOMY    . MITRAL VALVE REPAIR  ~ 2003  . SHOULDER OPEN ROTATOR CUFF REPAIR Left   . TOTAL KNEE ARTHROPLASTY Left 02/20/2016   Procedure: LEFT TOTAL KNEE ARTHROPLASTY WITH RIGHT KNEE INJECTION;  Surgeon: [name], MD;  Location: [facility];  Service: Orthopedics;  Laterality: Left;  Marland Kitchen VASECTOMY      Social History:  Social History   Socioeconomic History  . Marital status: Married    Spouse name: GLORIA  . Number of children: 2  . Years of education: Not on file  . Highest education level: Not on file  Occupational History  . Occupation:  Environmental manager    Comment: RETIRED  Social Needs  . Financial resource strain: Not on file  . Food insecurity:    Worry: Not on file    Inability: Not on file  . Transportation needs:    Medical: Not on file    Non-medical: Not on file  Tobacco Use  . Smoking status: Former Smoker    Packs/day: 3.00     Years: 35.00    Pack years: 105.00    Types: Cigarettes    Last attempt to quit: 02/16/1988    Years since quitting: 30.0  . Smokeless tobacco: Never Used  . Tobacco comment:  Year Quit: 1990   Substance and Sexual Activity  . Alcohol use: No  . Drug use: Never  . Sexual activity: Not Currently  Lifestyle  . Physical activity:    Days per week: Not on file    Minutes per session: Not on file  . Stress: Not on file  Relationships  . Social connections:    Talks on phone: Not on file    Gets together: Not on file    Attends religious service: Not on file    Active member of club or organization: Not on file    Attends meetings of clubs or organizations:  Not on file    Relationship status: Not on file  . Intimate partner violence:    Fear of current or ex partner: Not on file    Emotionally abused: Not on file    Physically abused: Not on file    Forced sexual activity: Not on file  Other Topics Concern  . Not on file  Social History Narrative  . Not on file    Family History:  Family History  Problem Relation Age of Onset  . Congestive Heart Failure Father   . Heart disease Father   . Stroke Brother   . Stroke Sister   . Diabetes Mellitus II Brother   . Diabetes Mellitus II Sister   . Lung cancer Brother   . Colon cancer Neg Hx   . Esophageal cancer Neg Hx   . Rectal cancer Neg Hx   . Stomach cancer Neg Hx     Medications:   Current Outpatient Medications on File Prior to Visit  Medication Sig Dispense Refill  . acetaminophen (TYLENOL) 500 MG tablet Take 500 mg by mouth as needed for mild pain.     Marland Kitchen amLODipine (NORVASC) 2.5 MG tablet Take 2.5 mg by mouth daily before breakfast.     . apixaban (ELIQUIS) 5 MG TABS tablet Take 1 tablet (5 mg total) by mouth 2 (two) times daily. 60 tablet 11  . atorvastatin (LIPITOR) 10 MG tablet Take 10 mg by mouth daily.    . Coenzyme Q10 200 MG capsule Take 200 mg by mouth 2 (two) times daily.    Marland Kitchen lisinopril  (PRINIVIL,ZESTRIL) 20 MG tablet Take 1 tablet (20 mg total) by mouth daily. 90 tablet 3  . MULTIPLE VITAMIN PO Take 1 tablet by mouth daily.    Marland Kitchen omeprazole (PRILOSEC) 20 MG capsule Take 20 mg by mouth daily before breakfast.      No current facility-administered medications on file prior to visit.     Allergies:   Allergies  Allergen Reactions  . Flomax [Tamsulosin Hcl] Other (See Comments)    Makes him "swimmy headed"     Physical Exam  Vitals:   03/15/18 1334  BP: 121/66  Pulse: (!) 55  Weight: 242 lb 6.4 oz (110 kg)  Height: 5\' 11"  (1.803 m)   Body mass index is 33.81 kg/m. No exam data present  General: well developed, well nourished, pleasant elderly Caucasian male, seated, in no evident distress Head: head normocephalic and atraumatic.   Neck: supple with no carotid or supraclavicular bruits Cardiovascular: irregular rate and rhythm, no murmurs Musculoskeletal: no deformity Skin:  no rash/petichiae Vascular:  Normal pulses all extremities  Neurologic Exam Mental Status: Awake and fully alert. Oriented to place and time. Recent and remote memory intact. Attention span, concentration and fund of knowledge appropriate. Mood and affect appropriate.  Cranial Nerves: Fundoscopic exam reveals sharp disc margins. Pupils equal, briskly reactive to light. Extraocular movements full with 2-3 beat nystagmus horizontally. Visual fields full to confrontation. Hearing intact. Facial sensation intact. Face, tongue, palate moves normally and symmetrically.  Motor: Normal bulk and tone. Normal strength in all tested extremity muscles. Sensory.: intact to touch , pinprick , position and vibratory sensation.  Coordination: Rapid alternating movements normal in all extremities. Finger-to-nose and heel-to-shin performed accurately bilaterally. Gait and Station: Arises from chair without difficulty. Stance is normal. Gait demonstrates normal stride length and balance . Able to heel, toe and  tandem walk without difficulty.  Reflexes: 1+ and symmetric. Toes downgoing.  Diagnostic Data (Labs, Imaging, Testing)  CT HEAD WO CONTRAST 10/24/2017 IMPRESSION: 1. No intracranial hemorrhage or CT evidence of large acute infarct. 2. Remote small superior left cerebellar infarct. 3. Chronic microvascular changes. 4. Global atrophy. 5. Polypoid opacification right sphenoid sinus suggestive of inspissated material from changes of chronic sinusitis as noted above.  MR BRAIN WO CONTRAST MR MRA HEAD  10/24/2017 IMPRESSION: MRI HEAD IMPRESSION:  1. No acute intracranial infarct or other abnormality identified. 2. Age-related cerebral atrophy with moderate chronic small vessel ischemic disease. 3. Small remote left cerebellar infarct.  MRA HEAD IMPRESSION:  Negative intracranial MRA. No large vessel occlusion. No hemodynamically significant or correctable stenosis.  ECHOCARDIOGRAM 10/24/2016 Study Conclusions  - Left ventricle: The cavity size was normal. There was mild   concentric hypertrophy. Systolic function was normal. The   estimated ejection fraction was in the range of 60% to 65%. Wall   motion was normal; there were no regional wall motion   abnormalities. - Aortic valve: Transvalvular velocity was within the normal range.   There was no stenosis. There was no regurgitation. - Mitral valve: Transvalvular velocity was within the normal range.   There was no evidence for stenosis. There was mild regurgitation.   Valve area by continuity equation (using LVOT flow): 0.9 cm^2. - Left atrium: The atrium was severely dilated. - Right ventricle: The cavity size was normal. Wall thickness was   normal. Systolic function was normal. - Tricuspid valve: There was trivial regurgitation. - Pulmonary arteries: Systolic pressure was mildly increased. PA   peak pressure: 41 mm Hg (S).    ASSESSMENT: BILLYJACK TROMPETER is a 83 y.o. year old male here with embolic  posterior TIA on 10/24/2017 secondary to known PAF on Coumadin. Vascular risk factors include AF on warfarin, MV repair, HTN, HLD and OSA.  [name] returns today for follow-up visit with continued transient diplopia episodes.  [name] has recently been switched from warfarin to Eliquis by his cardiologist for atrial fibrillation and secondary stroke prevention.    PLAN: -Continue Eliquis (apixaban) daily  and Lipitor for secondary stroke prevention -F/u with PCP regarding your HLD and HTN management -f/u with cardiologist for continued atrial fibrillation and Eliquis management -Advised to continue compliance with CPAP for OSA management -Referral placed for neuro-ophthalmology for further evaluation of continued transient diplopia -Encourage [name] to document when his episodes occur along with any associated symptoms such as dizziness, lightheadedness, nausea or any other visual changes -continue to monitor BP at home -advised to continue to stay active and maintain a healthy diet -Maintain strict control of hypertension with blood pressure goal below 130/90, diabetes with hemoglobin A1c goal below 6.5% and cholesterol with LDL cholesterol (bad cholesterol) goal below 70 mg/dL. I also advised the [name] to eat a healthy diet with plenty of whole grains, cereals, fruits and vegetables, exercise regularly and maintain ideal body weight.  Follow up in 6 months or call earlier if needed   Greater than 50% of time during this 25 minute visit was spent on counseling,explanation of diagnosis of TIA, reviewing risk factor management of OSA, PAF, HTN and HLD, planning of further management, discussion with [name] and family and coordination of care    [name], AGNP-BC  [facility] [address] [place], [place] [address]  Phone [phone] Fax [phone] Note: This document was prepared with digital dictation and possible smart phrase technology. Any  transcriptional errors that result from this process are unintentional.

## 2018-03-16 NOTE — Progress Notes (Signed)
I agree with the above plan 

## 2018-04-11 ENCOUNTER — Ambulatory Visit (INDEPENDENT_AMBULATORY_CARE_PROVIDER_SITE_OTHER): Payer: Medicare Other

## 2018-04-11 ENCOUNTER — Encounter (INDEPENDENT_AMBULATORY_CARE_PROVIDER_SITE_OTHER): Payer: Self-pay | Admitting: Orthopaedic Surgery

## 2018-04-11 ENCOUNTER — Ambulatory Visit (INDEPENDENT_AMBULATORY_CARE_PROVIDER_SITE_OTHER): Payer: Medicare Other | Admitting: Orthopaedic Surgery

## 2018-04-11 VITALS — BP 139/86 | HR 82 | Ht 71.0 in | Wt 240.0 lb

## 2018-04-11 DIAGNOSIS — G8929 Other chronic pain: Secondary | ICD-10-CM | POA: Diagnosis not present

## 2018-04-11 DIAGNOSIS — M545 Low back pain: Secondary | ICD-10-CM

## 2018-04-11 DIAGNOSIS — M1711 Unilateral primary osteoarthritis, right knee: Secondary | ICD-10-CM | POA: Diagnosis not present

## 2018-04-11 DIAGNOSIS — M25561 Pain in right knee: Secondary | ICD-10-CM | POA: Diagnosis not present

## 2018-04-11 NOTE — Progress Notes (Signed)
Office Visit Note   Patient: [name]           Date of Birth: 1936-01-26           MRN: [id number] Visit Date: 04/11/2018              Requested by: Dione Housekeeper, MD [address] [place], [place] [address] PCP: Dione Housekeeper, MD   Assessment & Plan: Visit Diagnoses:  1. Chronic pain of right knee   2. Chronic right-sided low back pain, unspecified whether sciatica present   3. Unilateral primary osteoarthritis, right knee     Plan: Patient is doing well from his left total knee arthroplasty from 2 years ago.  Right knee is progressed to the point when he plays golf he has difficulty walking for several days after the round has difficulty sleeping pain at night pain with ambulation and mild varus deformity with bone-on-bone changes.  He like to proceed with total knee arthroplasty.  We discussed cardiology preoperative visit he will need to stop his Eliquis and then restarted after the procedure.  Procedure discussed he is familiar with doing his rehab from his opposite left knee 2 years ago.  Patient understands request to proceed.  Follow-Up Instructions: No follow-ups on file.   Orders:  Orders Placed This Encounter  Procedures  . XR Knee 1-2 Views Right  . XR Pelvis 1-2 Views  . XR Lumbar Spine 2-3 Views   No orders of the defined types were placed in this encounter.     Procedures: No procedures performed   Clinical Data: No additional findings.   Subjective: Chief Complaint  Patient presents with  . Lower Back - Pain  . Right Hip - Pain  . Right Knee - Pain    HPI 83 year old male returns with progressive right knee end-stage osteoarthritis.  He states he had played golf and then had several weeks where he is having great difficulty ambulating could not sleep had severe problems bearing weight and had to get on crutches.  He has difficulty straightening his knee in his knee in the morning he is taken anti-inflammatories and intra-articular  injections.  He is used Tylenol with some relief cannot take anti-inflammatories due to the Eliquis.  Review of Systems is of for atrial fib previously on Coumadin then problems with the CVA and was switched to Eliquis.  He takes atorvastatin for high cholesterol.  Positive for hypertension sleep apnea dyspnea.  Carotid stenosis.  History of mitral valve repair, BPH diverticulitis.  Left total knee arthroplasty 02/20/2016 doing well.  Otherwise negative pertains HPI.   Objective: Vital Signs: BP 139/86   Pulse 82   Ht 5\' 11"  (1.803 m)   Wt 240 lb (108.9 kg)   BMI 33.47 kg/m   Physical Exam Constitutional:      Appearance: He is well-developed.  HENT:     Head: Normocephalic and atraumatic.  Eyes:     Pupils: Pupils are equal, round, and reactive to light.  Neck:     Thyroid: No thyromegaly.     Trachea: No tracheal deviation.  Cardiovascular:     Rate and Rhythm: Normal rate.  Pulmonary:     Effort: Pulmonary effort is normal.     Breath sounds: No wheezing.  Abdominal:     General: Bowel sounds are normal.     Palpations: Abdomen is soft.  Skin:    General: Skin is warm and dry.     Capillary Refill: Capillary refill takes less  than 2 seconds.  Neurological:     Mental Status: He is alert and oriented to person, place, and time.  Psychiatric:        Behavior: Behavior normal.        Thought Content: Thought content normal.        Judgment: Judgment normal.     Ortho Exam patient has well-healed left total knee arthroplasty.  No pain with hip range of motion right and left negative logroll.  Distal pulses are palpable.  Right knee crepitus.  He lacks 5 degrees reaching full extension flexes 110 degrees.  Specialty Comments:  No specialty comments available.  Imaging: No results found.   PMFS History: Patient Active Problem List   Diagnosis Date Noted  . Unilateral primary osteoarthritis, right knee 01/15/2016    Priority: Medium  . TIA (transient ischemic  attack) 12/05/2017  . Type 2 diabetes mellitus without complication ([place]) 47/82/9562  . Brainstem infarction ([place]) 10/25/2017  . Diplopia 10/24/2017  . S/P total knee arthroplasty, left 04/29/2016  . Weakness of both arms 07/19/2012  . Colon cancer screening 06/22/2012  . Unspecified venous (peripheral) insufficiency 05/05/2012  . Chronic venous insufficiency 04/07/2012  . Other malaise and fatigue 03/16/2012  . Disturbance of skin sensation 03/16/2012  . Carotid stenosis 08/16/2011  . Obesity 08/16/2011  . Coronary artery disease excluded   . H/O mitral valve repair   . Endocarditis, valve unspecified, unspecified cause   . Dizziness 12/16/2010  . Dyslipidemia 09/23/2010  . Essential hypertension 04/06/2007  . Sleep apnea 04/06/2007  . DYSPNEA 04/06/2007   Past Medical History:  Diagnosis Date  . Arthritis    knees, shoulder,  hips (10/24/2017)  . Atrial fibrillation ([place])    on Coumadin  . BPH (benign prostatic hyperplasia)   . Coronary artery disease excluded   . Diverticulosis   . Endocarditis, valve unspecified, unspecified cause   . GERD (gastroesophageal reflux disease)   . H/O mitral valve repair    Postoperative ring with postoperative atrial fibrillation  . Heart murmur    hx  . Hiatal hernia 06/13/2002  . High cholesterol   . History of kidney stones    passed spontaneously x2   . OSA on CPAP   . Stroke ([place])   . Unspecified essential hypertension   . Weakness of both arms 07/19/2012    Family History  Problem Relation Age of Onset  . Congestive Heart Failure Father   . Heart disease Father   . Stroke Brother   . Stroke Sister   . Diabetes Mellitus II Brother   . Diabetes Mellitus II Sister   . Lung cancer Brother   . Colon cancer Neg Hx   . Esophageal cancer Neg Hx   . Rectal cancer Neg Hx   . Stomach cancer Neg Hx     Past Surgical History:  Procedure Laterality Date  . CARDIAC CATHETERIZATION  X 2  . CARDIOVERSION N/A 04/14/2016   Procedure:  CARDIOVERSION;  Surgeon: Minus Breeding, MD;  [facility] END[facility]COPY;  Service: Cardiovascular;  Laterality: N/A;  . CATARACT EXTRACTION W/PHACO Right 05/12/2015   Procedure: CATARACT EXTRACTION PHACO AND INTRAOCULAR LENS PLACEMENT RIGHT EYE CDE=7.75;  Surgeon: Tonny Branch, MD;  Location: [facility];  Service: Ophthalmology;  Laterality: Right;  . CATARACT EXTRACTION W/PHACO Left 06/12/2015   Procedure: CATARACT EXTRACTION PHACO AND INTRAOCULAR LENS PLACEMENT (IOC);  Surgeon: Tonny Branch, MD;  Location: [facility];  Service: Ophthalmology;  Laterality: Left;  CDE:  13.17  . INJECTION  KNEE Right 02/20/2016   Procedure: KNEE INJECTION;  Surgeon: [name], MD;  Location: [facility];  Service: Orthopedics;  Laterality: Right;  . JOINT REPLACEMENT    . KNEE ARTHROSCOPY Left   . LAPAROSCOPIC CHOLECYSTECTOMY    . MITRAL VALVE REPAIR  ~ 2003  . SHOULDER OPEN ROTATOR CUFF REPAIR Left   . TOTAL KNEE ARTHROPLASTY Left 02/20/2016   Procedure: LEFT TOTAL KNEE ARTHROPLASTY WITH RIGHT KNEE INJECTION;  Surgeon: [name], MD;  Location: [facility];  Service: Orthopedics;  Laterality: Left;  Marland Kitchen VASECTOMY     Social History   Occupational History  . Occupation:  Environmental manager    Comment: RETIRED  Tobacco Use  . Smoking status: Former Smoker    Packs/day: 3.00    Years: 35.00    Pack years: 105.00    Types: Cigarettes    Last attempt to quit: 02/16/1988    Years since quitting: 30.1  . Smokeless tobacco: Never Used  . Tobacco comment:  Year Quit: 1990   Substance and Sexual Activity  . Alcohol use: No  . Drug use: Never  . Sexual activity: Not Currently

## 2018-04-12 ENCOUNTER — Encounter (INDEPENDENT_AMBULATORY_CARE_PROVIDER_SITE_OTHER): Payer: Self-pay | Admitting: Orthopaedic Surgery

## 2018-04-14 ENCOUNTER — Telehealth: Payer: Self-pay | Admitting: Cardiology

## 2018-04-14 NOTE — Telephone Encounter (Signed)
5.  [facility] Dr. Laurance Flatten  6. What is your office phone number [phone]   7.   What is your office fax number [phone]  8.   Anesthesia type (None, local, MAC, general) ? [place]

## 2018-04-14 NOTE — Telephone Encounter (Signed)
New Message      [facility] Pre-operative Risk Assessment    Request for surgical clearance:  1. What type of surgery is being performed? Deep scaling root planning  2. When is this surgery scheduled? 04/18/18  3. What type of clearance is required (medical clearance vs. Pharmacy clearance to hold med vs. Both)? BOth  4. Are there any medications that need to be held prior to surgery and how long? Eliquis, patient's wife calling in because she's concerned about him taking Eliquis and having the procedure done.   5. Practice name and name of physician performing surgery? [facility] Dr. Laurance Flatten  6. What is your office phone number [phone]   7.   What is your office fax number N/A  8.   Anesthesia type (None, local, MAC, general) ? Not Sure    Andree Coss 04/14/2018, 10:40 AM  _________________________________________________________________   (provider comments below)

## 2018-04-14 NOTE — Telephone Encounter (Signed)
   Primary Cardiologist: Minus Breeding, MD  Chart reviewed as part of pre-operative protocol coverage. Patient was contacted 04/14/2018 in reference to pre-operative risk assessment for pending surgery as outlined below.  [name] was last seen on 12/05/17 by Dr. Percival Spanish.  Since that day, [name] has done well. He continues going to the gym and can complete more than 4.0 METS.   Per our pharmacy staff: Patient with diagnosis of Afib on Eliquis for anticoagulation.    Procedure: Deep scaling root planning Date of procedure: 04/18/2018  CHADS2-VASc score of  5 (CHF, HTN, AGE, DM2, stroke/tia x 2, CAD, AGE, male)  Per office protocol, patient does NOT need to hold his Eliquis. Patient is at increased risk off anticoagulation due to hx of stroke. This appears to be a minor dental procedure and does not require holding anticoagulation  Therefore, based on ACC/AHA guidelines, the patient would be at acceptable risk for the planned procedure without further cardiovascular testing.   I will route this recommendation to the requesting party via Epic fax function and remove from pre-op pool.  Please call with questions.  Tami Lin Miller [place], PA 04/14/2018, 4:19 PM

## 2018-04-14 NOTE — Telephone Encounter (Signed)
Patient with diagnosis of Afib on Eliquis for anticoagulation.    Procedure: Deep scaling root planning Date of procedure: 04/18/2018  CHADS2-VASc score of  5 (CHF, HTN, AGE, DM2, stroke/tia x 2, CAD, AGE, male)  Per office protocol, patient does NOT need to hold his Eliquis. Patient is at increased risk off anticoagulation due to hx of stroke. This appears to be a minor dental procedure and does not require holding anticoagulation.

## 2018-04-20 ENCOUNTER — Telehealth: Payer: Self-pay

## 2018-04-20 NOTE — Telephone Encounter (Signed)
   [facility] Pre-operative Risk Assessment    Request for surgical clearance:  1. What type of surgery is being performed? Right Total Knee    2. When is this surgery scheduled? TBD   3. What type of clearance is required (medical clearance vs. Pharmacy clearance to hold med vs. Both)? Both  4. Are there any medications that need to be held prior to surgery and how long? Eliquis    5. Practice name and name of physician performing surgery? [facility]   6. What is your office phone number [phone]    7.   What is your office fax number [phone]  8.   Anesthesia type (None, local, MAC, general) ? General or Spinal   Ena Dawley 04/20/2018, 4:11 PM  _________________________________________________________________   (provider comments below)

## 2018-04-21 NOTE — Telephone Encounter (Signed)
Difficult but I would suggest limiting the time off Eliquis to 2 days.

## 2018-04-21 NOTE — Telephone Encounter (Signed)
LVM for pt to call back to assess for symptoms to determine clearance.

## 2018-04-21 NOTE — Telephone Encounter (Signed)
Patient with diagnosis of Afib on Eliquis for anticoagulation.    Procedure: right total knee Date of procedure: TBD  CHADS2-VASc score of 6 (CHF, HTN, AGE, DM2, stroke/tia x 2, CAD, AGE, male)  CrCl 152ml/min  Generally recommend hold Eliquis for 3 days due to spinal anesthesia with TKA, but with history of TIA will route to MD for input on length of hold for procedure since would want to limit time off therapeutic anticoagulation.  Would recommend resume Eliquis as soon as safe post procedure.

## 2018-04-24 NOTE — Telephone Encounter (Signed)
Lm for pt to call back

## 2018-04-25 NOTE — Telephone Encounter (Signed)
I talked to pt and he has no chest pain or SOB.  He is doing regular activities as his knee allows.  He has not decided to have the surgery yet.  He will call us back to let us know.     He is class 3 risk at 6.6% risk of major cardiac event. With hx of CVA and HF with MV repair.  Non obstructive CAD.  Pt is stable and would proceed with surgery if he wishes.  With hx of CVA would only hold eliquis for 2 days prior to surgery.

## 2018-04-27 NOTE — Telephone Encounter (Signed)
   Primary Cardiologist: Minus Breeding, MD  Chart reviewed as part of pre-operative protocol coverage. Patient was contacted 04/27/2018 in reference to pre-operative risk assessment for pending surgery as outlined below.  Yoav Fran Lowes was last seen on 12/05/17 by Dr. Percival Spanish.  Since that day, JARETT DRALLE has done well with no chest pain or SOB.    He is class 3 risk at 6.6% of major cardiac event with hx of CVA and HF with MV repair.  He has non obstructive CAD.  He is stable and may proceed with surgery.  With hx of CVA would only hold Eliquis for 2 days prior to surgery.    Therefore, based on ACC/AHA guidelines, the patient would be at acceptable risk for the planned procedure without further cardiovascular testing.   I will route this recommendation to the requesting party via Epic fax function and remove from pre-op pool.  Please call with questions.  Cecilie Kicks, NP 04/27/2018, 12:00 PM

## 2018-07-26 ENCOUNTER — Other Ambulatory Visit: Payer: Self-pay

## 2018-07-26 ENCOUNTER — Other Ambulatory Visit (HOSPITAL_COMMUNITY)
Admission: RE | Admit: 2018-07-26 | Discharge: 2018-07-26 | Disposition: A | Discharge status: Home / Self Care | Payer: Medicare Other | Source: Ambulatory Visit | Attending: Cardiology | Admitting: Cardiology

## 2018-07-26 DIAGNOSIS — Z7901 Long term (current) use of anticoagulants: Secondary | ICD-10-CM | POA: Insufficient documentation

## 2018-07-26 DIAGNOSIS — Z5181 Encounter for therapeutic drug level monitoring: Secondary | ICD-10-CM | POA: Insufficient documentation

## 2018-07-26 DIAGNOSIS — I4819 Other persistent atrial fibrillation: Secondary | ICD-10-CM | POA: Insufficient documentation

## 2018-07-26 LAB — CBC
HCT: 42.2 % (ref 39.0–52.0)
Hemoglobin: 13.9 g/dL (ref 13.0–17.0)
MCH: 31.6 pg (ref 26.0–34.0)
MCHC: 32.9 g/dL (ref 30.0–36.0)
MCV: 95.9 fL (ref 80.0–100.0)
Platelets: 203 10*3/uL (ref 150–400)
RBC: 4.4 MIL/uL (ref 4.22–5.81)
RDW: 12.8 % (ref 11.5–15.5)
WBC: 5.8 10*3/uL (ref 4.0–10.5)
nRBC: 0 % (ref 0.0–0.2)

## 2018-07-26 LAB — BASIC METABOLIC PANEL
Anion gap: 10 (ref 5–15)
BUN: 16 mg/dL (ref 8–23)
CO2: 26 mmol/L (ref 22–32)
Calcium: 8.6 mg/dL — ABNORMAL LOW (ref 8.9–10.3)
Chloride: 106 mmol/L (ref 98–111)
Creatinine, Ser: 0.66 mg/dL (ref 0.61–1.24)
GFR calc Af Amer: >60 mL/min (ref >60)
GFR calc non Af Amer: >60 mL/min (ref >60)
Glucose, Bld: 113 mg/dL — ABNORMAL HIGH (ref 70–99)
Potassium: 3.8 mmol/L (ref 3.5–5.1)
Sodium: 142 mmol/L (ref 135–145)

## 2018-08-08 ENCOUNTER — Ambulatory Visit (INDEPENDENT_AMBULATORY_CARE_PROVIDER_SITE_OTHER): Payer: Medicare Other | Admitting: *Deleted

## 2018-08-08 ENCOUNTER — Other Ambulatory Visit: Payer: Self-pay | Admitting: *Deleted

## 2018-08-08 DIAGNOSIS — Z7901 Long term (current) use of anticoagulants: Secondary | ICD-10-CM

## 2018-08-08 DIAGNOSIS — I4819 Other persistent atrial fibrillation: Secondary | ICD-10-CM

## 2018-08-08 DIAGNOSIS — Z5181 Encounter for therapeutic drug level monitoring: Secondary | ICD-10-CM

## 2018-08-08 NOTE — Progress Notes (Signed)
Pt was started on Eliquis 5mg  twice daily for atrial fibrillation on 12/15/17.    He denies any problems since starting Eliquis.  He denies any bleeding, increased bruising or GI upset.  Reviewed patients medication list.  Pt is not currently on any combined P-gp and strong CYP3A4 inhibitors/inducers (ketoconazole, traconazole, ritonavir, carbamazepine, phenytoin, rifampin, St. John's wort).  Reviewed labs from 07/26/18.  SCr 0.70,  Weight 107.7kg,  Dose is appropriate based on age, weight, and SCr.  Hgb and HCT: 13.9/42.2  Plts 203K  Labs are stable when compared to labs of 01/16/18.  A full discussion of the nature of anticoagulants has been carried out.  A benefit/risk analysis has been presented to the patient, so that they understand the justification for choosing anticoagulation with Eliquis at this time.  The need for compliance is stressed.  Pt is aware to take the medication twice daily.  Side effects of potential bleeding are discussed, including unusual colored urine or stools, coughing up blood or coffee ground emesis, nose bleeds or serious fall or head trauma.  Discussed signs and symptoms of stroke. The patient should avoid any OTC items containing aspirin or ibuprofen.  Avoid alcohol consumption.   Call if any signs of abnormal bleeding.  Discussed financial obligations and resolved any difficulty in obtaining medication.    Results of lab work from 07/26/18 discussed with patient.  6 month follow up appointment made for 02/20/2019.  Orders for lab work week of 02/07/19 - 02-14-19 given to wife.  Pt will have drawn at [facility].

## 2018-08-24 ENCOUNTER — Encounter: Payer: Self-pay | Admitting: *Deleted

## 2018-09-13 ENCOUNTER — Ambulatory Visit: Payer: Medicare Other | Admitting: Adult Health

## 2018-09-15 ENCOUNTER — Other Ambulatory Visit: Payer: Self-pay

## 2018-09-15 ENCOUNTER — Telehealth: Payer: Self-pay | Admitting: Family Medicine

## 2018-09-18 ENCOUNTER — Encounter: Payer: Self-pay | Admitting: Family Medicine

## 2018-09-18 ENCOUNTER — Ambulatory Visit (INDEPENDENT_AMBULATORY_CARE_PROVIDER_SITE_OTHER): Payer: Medicare Other | Admitting: Family Medicine

## 2018-09-18 ENCOUNTER — Other Ambulatory Visit: Payer: Self-pay

## 2018-09-18 VITALS — BP 151/82 | HR 61 | Temp 98.2°F | Ht 71.0 in | Wt 243.8 lb

## 2018-09-18 DIAGNOSIS — I10 Essential (primary) hypertension: Secondary | ICD-10-CM

## 2018-09-18 DIAGNOSIS — G4733 Obstructive sleep apnea (adult) (pediatric): Secondary | ICD-10-CM

## 2018-09-18 DIAGNOSIS — I4811 Longstanding persistent atrial fibrillation: Secondary | ICD-10-CM

## 2018-09-18 DIAGNOSIS — E785 Hyperlipidemia, unspecified: Secondary | ICD-10-CM

## 2018-09-18 DIAGNOSIS — E119 Type 2 diabetes mellitus without complications: Secondary | ICD-10-CM

## 2018-09-18 LAB — BAYER DCA HB A1C WAIVED: HB A1C (BAYER DCA - WAIVED): 6.4 % (ref <7.0)

## 2018-09-18 MED ORDER — AMLODIPINE BESYLATE 2.5 MG PO TABS: 2.5000 mg | ORAL_TABLET | Freq: Every day | ORAL | 1 refills | Status: DC

## 2018-09-18 MED ORDER — ATORVASTATIN CALCIUM 10 MG PO TABS: 10.0000 mg | ORAL_TABLET | Freq: Every day | ORAL | 1 refills | Status: DC

## 2018-09-18 MED ORDER — APIXABAN 5 MG PO TABS: 5.0000 mg | ORAL_TABLET | Freq: Two times a day (BID) | ORAL | 1 refills | Status: DC

## 2018-09-18 MED ORDER — METFORMIN HCL ER 500 MG PO TB24: 500.0000 mg | ORAL_TABLET | Freq: Every day | ORAL | 2 refills | Status: DC

## 2018-09-18 MED ORDER — LISINOPRIL 20 MG PO TABS: 20.0000 mg | ORAL_TABLET | Freq: Every day | ORAL | 1 refills | Status: DC

## 2018-09-18 NOTE — Progress Notes (Signed)
Subjective:  Patient ID: [name], male    DOB: [birth year]  Age: 83 y.o. MRN: [id number]  CC: New Patient (Initial Visit) ([name]) and Establish Care   HPI [name] presents for new patient visit.  He is transferring from recently retired physician Dr. [name].  He is having problems with arthritis in pain in the right knee.  He is considering knee replacement surgery.  Of note is the patient has become dyspneic and he relates that to having increased weight.  He is an obstructive sleep apnea patient using CPAP at.13 To 14 mmH2O.   Patient in for follow-up of atrial fibrillation. Patient denies any recent bouts of chest pain or palpitations. Additionally, patient is taking anticoagulants. Patient denies any recent excessive bleeding episodes including epistaxis, bleeding from the gums, genitalia, rectal bleeding or hematuria. Additionally there has been no excessive bruising.  Patient in for follow-up of elevated cholesterol. Doing well without complaints on current medication. Denies side effects of statin including myalgia and arthralgia and nausea. Also in today for liver function testing. Currently no chest pain, shortness of breath or other cardiovascular related symptoms noted.  Atrial fibrillation  Follow-up of diabetes. Patient does not check blood sugar at home Patient denies symptoms such as polyuria, polydipsia, excessive hunger, nausea No significant hypoglycemic spells noted. Medications as noted below. Taking them regularly without complication/adverse reaction being reported today.  Depression screen PHQ 2/9 09/18/2018  Decreased Interest 0  Down, Depressed, Hopeless 0  PHQ - 2 Score 0    History Edge has a past medical history of Arthritis, Atrial fibrillation ([place]), BPH (benign prostatic hyperplasia), Coronary artery disease excluded, Diverticulosis, Endocarditis, valve unspecified, unspecified cause, GERD (gastroesophageal reflux disease), H/O mitral valve  repair, Heart murmur, Hiatal hernia (06/13/2002), High cholesterol, History of kidney stones, OSA on CPAP, Stroke ([place]), Unspecified essential hypertension, and Weakness of both arms (07/19/2012).   He has a past surgical history that includes Vasectomy; Mitral valve repair (~ 2003); Cataract extraction w/PHACO (Right, 05/12/2015); Cataract extraction w/PHACO (Left, 06/12/2015); Total knee arthroplasty (Left, 02/20/2016); Injection knee (Right, 02/20/2016); Cardioversion (N/A, 04/14/2016); Laparoscopic cholecystectomy; Knee arthroscopy (Left); Joint replacement; Shoulder open rotator cuff repair (Left); and Cardiac catheterization (X 2).   His family history includes Congestive Heart Failure in his father; Diabetes Mellitus II in his brother and sister; Heart disease in his father; Lung cancer in his brother; Stroke in his brother and sister.He reports that he quit smoking about 30 years ago. His smoking use included cigarettes. He has a 105.00 pack-year smoking history. He has never used smokeless tobacco. He reports that he does not drink alcohol or use drugs.  Current Outpatient Medications on File Prior to Visit  Medication Sig Dispense Refill  . acetaminophen (TYLENOL) 500 MG tablet Take 500 mg by mouth as needed for mild pain.     Marland Kitchen omeprazole (PRILOSEC) 20 MG capsule Take 20 mg by mouth 2 (two) times daily.      No current facility-administered medications on file prior to visit.      ROS Review of Systems  Constitutional: Negative.   HENT: Negative.   Eyes: Negative for visual disturbance.  Respiratory: Negative for cough and shortness of breath.   Cardiovascular: Negative for chest pain and leg swelling.  Gastrointestinal: Negative for abdominal pain, diarrhea, nausea and vomiting.  Genitourinary: Negative for difficulty urinating.  Musculoskeletal: Negative for arthralgias and myalgias.  Skin: Negative for rash.  Neurological: Negative for headaches.  Psychiatric/Behavioral: Negative for  sleep disturbance.  Objective:  BP (!) 151/82   Pulse 61   Temp 98.2 F (36.8 C) (Temporal)   Ht '5\' 11"'  (1.803 m)   Wt 243 lb 12.8 oz (110.6 kg)   BMI 34.00 kg/m   BP Readings from Last 3 Encounters:  09/18/18 (!) 151/82  04/11/18 139/86  03/15/18 121/66    Wt Readings from Last 3 Encounters:  09/18/18 243 lb 12.8 oz (110.6 kg)  04/11/18 240 lb (108.9 kg)  03/15/18 242 lb 6.4 oz (110 kg)     Physical Exam Constitutional:      General: He is not in acute distress.    Appearance: He is well-developed.  HENT:     Head: Normocephalic and atraumatic.     Right Ear: External ear normal.     Left Ear: External ear normal.     Nose: Nose normal.  Eyes:     Conjunctiva/sclera: Conjunctivae normal.     Pupils: Pupils are equal, round, and reactive to light.  Neck:     Musculoskeletal: Normal range of motion and neck supple.  Cardiovascular:     Rate and Rhythm: Normal rate and regular rhythm.     Heart sounds: Normal heart sounds. No murmur.  Pulmonary:     Effort: Pulmonary effort is normal. No respiratory distress.     Breath sounds: Normal breath sounds. No wheezing or rales.  Abdominal:     Palpations: Abdomen is soft.     Tenderness: There is no abdominal tenderness.  Musculoskeletal: Normal range of motion.        General: Swelling (knees) and deformity (hypertrophic knees) present.  Skin:    General: Skin is warm and dry.  Neurological:     Mental Status: He is alert and oriented to person, place, and time.     Deep Tendon Reflexes: Reflexes are normal and symmetric.  Psychiatric:        Behavior: Behavior normal.        Thought Content: Thought content normal.        Judgment: Judgment normal.       Assessment & Plan:   Devario was seen today for new patient (initial visit) and establish care.  Diagnoses and all orders for this visit:  Essential hypertension -     CBC with Differential/Platelet -     CMP14+EGFR -     Lipid panel  Type 2  diabetes mellitus without complication, without long-term current use of insulin (HCC) -     Bayer DCA Hb A1c Waived  Longstanding persistent atrial fibrillation  OSA (obstructive sleep apnea)  Dyslipidemia  Other orders -     amLODipine (NORVASC) 2.5 MG tablet; Take 1 tablet (2.5 mg total) by mouth daily before breakfast. -     apixaban (ELIQUIS) 5 MG TABS tablet; Take 1 tablet (5 mg total) by mouth 2 (two) times daily. -     atorvastatin (LIPITOR) 10 MG tablet; Take 1 tablet (10 mg total) by mouth daily. -     lisinopril (ZESTRIL) 20 MG tablet; Take 1 tablet (20 mg total) by mouth daily. -     metFORMIN (GLUCOPHAGE-XR) 500 MG 24 hr tablet; Take 1 tablet (500 mg total) by mouth daily with breakfast. -     celecoxib (CELEBREX) 200 MG capsule; Take 1 capsule (200 mg total) by mouth daily. With food       I have discontinued Jamill A. Chiappetta's MULTIPLE VITAMIN PO and Coenzyme Q10. I have also changed his amLODipine, atorvastatin, and lisinopril.  Additionally, I am having him start on metFORMIN and celecoxib. Lastly, I am having him maintain his omeprazole, acetaminophen, and apixaban.  Allergies as of 09/18/2018      Reactions   Flomax [tamsulosin Hcl] Other (See Comments)   Makes him "swimmy headed"      Medication List       Accurate as of September 18, 2018 11:59 PM. If you have any questions, ask your nurse or doctor.        STOP taking these medications   Coenzyme Q10 200 MG capsule Stopped by: Claretta Fraise, MD   MULTIPLE VITAMIN PO Stopped by: Claretta Fraise, MD     TAKE these medications   amLODipine 2.5 MG tablet Commonly known as: NORVASC Take 1 tablet (2.5 mg total) by mouth daily before breakfast.   apixaban 5 MG Tabs tablet Commonly known as: Eliquis Take 1 tablet (5 mg total) by mouth 2 (two) times daily.   atorvastatin 10 MG tablet Commonly known as: LIPITOR Take 1 tablet (10 mg total) by mouth daily.   celecoxib 200 MG capsule Commonly known as:  CeleBREX Take 1 capsule (200 mg total) by mouth daily. With food Started by: Claretta Fraise, MD   lisinopril 20 MG tablet Commonly known as: ZESTRIL Take 1 tablet (20 mg total) by mouth daily.   metFORMIN 500 MG 24 hr tablet Commonly known as: GLUCOPHAGE-XR Take 1 tablet (500 mg total) by mouth daily with breakfast. Started by: Claretta Fraise, MD   omeprazole 20 MG capsule Commonly known as: PRILOSEC Take 20 mg by mouth 2 (two) times daily.   TYLENOL 500 MG tablet Generic drug: acetaminophen Take 500 mg by mouth as needed for mild pain.        Follow-up: Return in about 3 months (around 12/19/2018).  Claretta Fraise, M.D.

## 2018-09-19 ENCOUNTER — Telehealth: Payer: Self-pay | Admitting: Family Medicine

## 2018-09-19 LAB — LIPID PANEL
Chol/HDL Ratio: 2.6 ratio (ref 0.0–5.0)
Cholesterol, Total: 107 mg/dL (ref 100–199)
HDL: 41 mg/dL (ref >39)
LDL Calculated: 33 mg/dL (ref 0–99)
Triglycerides: 163 mg/dL — ABNORMAL HIGH (ref 0–149)
VLDL Cholesterol Cal: 33 mg/dL (ref 5–40)

## 2018-09-19 LAB — CBC WITH DIFFERENTIAL/PLATELET
Basophils Absolute: 0 10*3/uL (ref 0.0–0.2)
Basos: 1 %
EOS (ABSOLUTE): 0.1 10*3/uL (ref 0.0–0.4)
Eos: 1 %
Hematocrit: 42.5 % (ref 37.5–51.0)
Hemoglobin: 14.7 g/dL (ref 13.0–17.7)
Immature Grans (Abs): 0 10*3/uL (ref 0.0–0.1)
Immature Granulocytes: 1 %
Lymphocytes Absolute: 1.1 10*3/uL (ref 0.7–3.1)
Lymphs: 17 %
MCH: 32.2 pg (ref 26.6–33.0)
MCHC: 34.6 g/dL (ref 31.5–35.7)
MCV: 93 fL (ref 79–97)
Monocytes Absolute: 0.6 10*3/uL (ref 0.1–0.9)
Monocytes: 9 %
Neutrophils Absolute: 4.7 10*3/uL (ref 1.4–7.0)
Neutrophils: 71 %
Platelets: 216 10*3/uL (ref 150–450)
RBC: 4.56 x10E6/uL (ref 4.14–5.80)
RDW: 13 % (ref 11.6–15.4)
WBC: 6.5 10*3/uL (ref 3.4–10.8)

## 2018-09-19 LAB — CMP14+EGFR
ALT: 19 IU/L (ref 0–44)
AST: 16 IU/L (ref 0–40)
Albumin/Globulin Ratio: 1.6 (ref 1.2–2.2)
Albumin: 4.4 g/dL (ref 3.7–4.7)
Alkaline Phosphatase: 80 IU/L (ref 39–117)
BUN/Creatinine Ratio: 20 (ref 10–24)
BUN: 14 mg/dL (ref 8–27)
Bilirubin Total: 0.7 mg/dL (ref 0.0–1.2)
CO2: 25 mmol/L (ref 20–29)
Calcium: 8.8 mg/dL (ref 8.6–10.2)
Chloride: 100 mmol/L (ref 96–106)
Creatinine, Ser: 0.7 mg/dL — ABNORMAL LOW (ref 0.76–1.27)
GFR calc Af Amer: 103 mL/min/{1.73_m2} (ref >59)
GFR calc non Af Amer: 89 mL/min/{1.73_m2} (ref >59)
Globulin, Total: 2.7 g/dL (ref 1.5–4.5)
Glucose: 139 mg/dL — ABNORMAL HIGH (ref 65–99)
Potassium: 3.9 mmol/L (ref 3.5–5.2)
Sodium: 141 mmol/L (ref 134–144)
Total Protein: 7.1 g/dL (ref 6.0–8.5)

## 2018-09-19 NOTE — Telephone Encounter (Signed)
Pt aware of A1C result and to start Metformin per result notes and voiced understanding.

## 2018-09-22 ENCOUNTER — Ambulatory Visit: Payer: Medicare Other | Admitting: Family Medicine

## 2018-09-25 ENCOUNTER — Encounter: Payer: Self-pay | Admitting: Family Medicine

## 2018-09-25 MED ORDER — CELECOXIB 200 MG PO CAPS: 200.0000 mg | ORAL_CAPSULE | Freq: Every day | ORAL | 5 refills | Status: DC

## 2018-10-01 ENCOUNTER — Encounter (HOSPITAL_COMMUNITY): Payer: Self-pay | Admitting: Emergency Medicine

## 2018-10-01 ENCOUNTER — Emergency Department (HOSPITAL_COMMUNITY): Payer: Medicare Other

## 2018-10-01 ENCOUNTER — Other Ambulatory Visit: Payer: Self-pay

## 2018-10-01 ENCOUNTER — Emergency Department (HOSPITAL_COMMUNITY)
Admission: EM | Admit: 2018-10-01 | Discharge: 2018-10-02 | Disposition: A | Discharge status: Home / Self Care | Payer: Medicare Other | Attending: Emergency Medicine | Admitting: Emergency Medicine

## 2018-10-01 DIAGNOSIS — M79604 Pain in right leg: Secondary | ICD-10-CM | POA: Diagnosis not present

## 2018-10-01 DIAGNOSIS — Z952 Presence of prosthetic heart valve: Secondary | ICD-10-CM | POA: Insufficient documentation

## 2018-10-01 DIAGNOSIS — I10 Essential (primary) hypertension: Secondary | ICD-10-CM | POA: Insufficient documentation

## 2018-10-01 DIAGNOSIS — Z8673 Personal history of transient ischemic attack (TIA), and cerebral infarction without residual deficits: Secondary | ICD-10-CM | POA: Insufficient documentation

## 2018-10-01 DIAGNOSIS — Z7901 Long term (current) use of anticoagulants: Secondary | ICD-10-CM | POA: Diagnosis not present

## 2018-10-01 DIAGNOSIS — Z96652 Presence of left artificial knee joint: Secondary | ICD-10-CM | POA: Insufficient documentation

## 2018-10-01 DIAGNOSIS — M25461 Effusion, right knee: Secondary | ICD-10-CM | POA: Diagnosis not present

## 2018-10-01 DIAGNOSIS — E119 Type 2 diabetes mellitus without complications: Secondary | ICD-10-CM | POA: Diagnosis not present

## 2018-10-01 DIAGNOSIS — Z79899 Other long term (current) drug therapy: Secondary | ICD-10-CM | POA: Diagnosis not present

## 2018-10-01 DIAGNOSIS — M1711 Unilateral primary osteoarthritis, right knee: Secondary | ICD-10-CM | POA: Diagnosis not present

## 2018-10-01 DIAGNOSIS — Z87891 Personal history of nicotine dependence: Secondary | ICD-10-CM | POA: Diagnosis not present

## 2018-10-01 MED ORDER — HYDROCODONE-ACETAMINOPHEN 5-325 MG PO TABS: 1.0000 | ORAL_TABLET | ORAL | 0 refills | Status: DC | PRN

## 2018-10-01 NOTE — ED Provider Notes (Signed)
[facility] EMERGENCY DEPARTMENT Provider Note   CSN: [id number] Arrival date & time: 10/01/18  2038     History   Chief Complaint Chief Complaint  Patient presents with   Leg Pain    HPI [name] is a 83 y.o. male.     HPI   [name] is a 83 y.o. male with past medical history of atrial fibrillation and is anticoagulated on Eliquis, chronic venous insufficiency, diabetes, who presents to the Emergency Department complaining of sudden onset of right posterior knee pain.  He states that he was walking in the grocery store when the pain occurred.  He describes as a sharp stabbing type pain from the back of his knee that radiates to the top of his calf and into his posterior thigh.  Pain is associated with weightbearing and with flexion of his knee.  He reports history of arthritis of his knee and states he has been told he needs a knee replacement.  He denies recent injury or fall, numbness or weakness of the extremity, skin changes or significant swelling.  No history of recent extended travel or previous DVT. No chest pain or shortness of breath.    Past Medical History:  Diagnosis Date   Arthritis    knees, shoulder,  hips (10/24/2017)   Atrial fibrillation (HCC)    on Coumadin   BPH (benign prostatic hyperplasia)    Coronary artery disease excluded    Diverticulosis    Endocarditis, valve unspecified, unspecified cause    GERD (gastroesophageal reflux disease)    H/O mitral valve repair    Postoperative ring with postoperative atrial fibrillation   Heart murmur    hx   Hiatal hernia 06/13/2002   High cholesterol    History of kidney stones    passed spontaneously x2    OSA on CPAP    Stroke ([place])    Unspecified essential hypertension    Weakness of both arms 07/19/2012    Patient Active Problem List   Diagnosis Date Noted   TIA (transient ischemic attack) 12/05/2017   Type 2 diabetes mellitus without complication ([place]) 47/42/5956     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   Diplopia 10/24/2017   S/P total knee arthroplasty, left 04/29/2016   Unilateral primary osteoarthritis, right knee 01/15/2016   Weakness of both arms 07/19/2012   Colon cancer screening 06/22/2012   Unspecified venous (peripheral) insufficiency 05/05/2012   Chronic venous insufficiency 04/07/2012   Other malaise and fatigue 03/16/2012   Disturbance of skin sensation 03/16/2012   Carotid stenosis 08/16/2011   Obesity 08/16/2011   Coronary artery disease excluded    H/O mitral valve repair    Endocarditis, valve unspecified, unspecified cause    Dizziness 12/16/2010   Dyslipidemia 09/23/2010   Essential hypertension 04/06/2007   Sleep apnea 04/06/2007   DYSPNEA 04/06/2007    Past Surgical History:  Procedure Laterality Date   CARDIAC CATHETERIZATION  X 2   CARDIOVERSION N/A 04/14/2016   Procedure: CARDIOVERSION;  Surgeon: Minus Breeding, MD;  Location: [place];  Service: Cardiovascular;  Laterality: N/A;   CATARACT EXTRACTION W/PHACO Right 05/12/2015   Procedure: CATARACT EXTRACTION PHACO AND INTRAOCULAR LENS PLACEMENT RIGHT EYE CDE=7.75;  Surgeon: Tonny Branch, MD;  Location: [facility];  Service: Ophthalmology;  Laterality: Right;   CATARACT EXTRACTION W/PHACO Left 06/12/2015   Procedure: CATARACT EXTRACTION PHACO AND INTRAOCULAR LENS PLACEMENT (IOC);  Surgeon: Tonny Branch, MD;  Location: [facility];  Service: Ophthalmology;  Laterality: Left;  CDE:  13.17  INJECTION KNEE Right 02/20/2016   Procedure: KNEE INJECTION;  Surgeon: [name], MD;  Location: [facility];  Service: Orthopedics;  Laterality: Right;   JOINT REPLACEMENT     KNEE ARTHROSCOPY Left    LAPAROSCOPIC CHOLECYSTECTOMY     MITRAL VALVE REPAIR  ~ 2003   SHOULDER OPEN ROTATOR CUFF REPAIR Left    TOTAL KNEE ARTHROPLASTY Left 02/20/2016   Procedure: LEFT TOTAL KNEE ARTHROPLASTY WITH RIGHT KNEE INJECTION;  Surgeon: [name], MD;  Location: [place];  Service:  Orthopedics;  Laterality: Left;   VASECTOMY          Home Medications    Prior to Admission medications   Medication Sig Start Date End Date Taking? Authorizing Provider  acetaminophen (TYLENOL) 500 MG tablet Take 500 mg by mouth as needed for mild pain.    Yes [provider]  amLODipine (NORVASC) 2.5 MG tablet Take 1 tablet (2.5 mg total) by mouth daily before breakfast. 09/18/18  Yes Stacks, Cletus Gash, MD  apixaban (ELIQUIS) 5 MG TABS tablet Take 1 tablet (5 mg total) by mouth 2 (two) times daily. 09/18/18  Yes Claretta Fraise, MD  atorvastatin (LIPITOR) 10 MG tablet Take 1 tablet (10 mg total) by mouth daily. 09/18/18  Yes Stacks, Cletus Gash, MD  CALCIUM PO Take 1 tablet by mouth daily.   Yes [provider]  celecoxib (CELEBREX) 200 MG capsule Take 1 capsule (200 mg total) by mouth daily. With food 09/25/18  Yes Stacks, Cletus Gash, MD  Coenzyme Q10 (CO Q 10) 100 MG CAPS Take 300 mg by mouth daily.   Yes [provider]  lisinopril (ZESTRIL) 20 MG tablet Take 1 tablet (20 mg total) by mouth daily. 09/18/18  Yes Claretta Fraise, MD  metFORMIN (GLUCOPHAGE-XR) 500 MG 24 hr tablet Take 1 tablet (500 mg total) by mouth daily with breakfast. 09/18/18  Yes Claretta Fraise, MD  Multiple Vitamin (MULTIVITAMIN WITH MINERALS) TABS tablet Take 1 tablet by mouth daily.   Yes [provider]  omeprazole (PRILOSEC) 20 MG capsule Take 20 mg by mouth at bedtime.    Yes [provider]    Family History Family History  Problem Relation Age of Onset   Congestive Heart Failure Father    Heart disease Father    Stroke Brother    Stroke Sister    Diabetes Mellitus II Brother    Diabetes Mellitus II Sister    Lung cancer Brother    Colon cancer Neg Hx    Esophageal cancer Neg Hx    Rectal cancer Neg Hx    Stomach cancer Neg Hx     Social History Social History   Tobacco Use   Smoking status: Former Smoker    Packs/day: 3.00    Years: 35.00    Pack years:  105.00    Types: Cigarettes    Quit date: 02/16/1988    Years since quitting: 30.6   Smokeless tobacco: Never Used   Tobacco comment:  Year Quit: 1990   Substance Use Topics   Alcohol use: No   Drug use: Never     Allergies   Flomax [tamsulosin hcl]   Review of Systems Review of Systems  Constitutional: Negative for chills and fever.  Musculoskeletal: Positive for arthralgias (Right posterior knee pain). Negative for back pain, joint swelling and neck pain.  Skin: Negative for color change and wound.  Neurological: Negative for dizziness, weakness and numbness.     Physical Exam Updated Vital Signs BP (!) 150/95 (  BP Location: Right Arm)    Pulse 69    Temp 98.5 F (36.9 C) (Oral)    Resp 16    SpO2 98%   Physical Exam Vitals signs and nursing note reviewed.  Constitutional:      General: He is not in acute distress.    Appearance: Normal appearance.  Neck:     Musculoskeletal: Normal range of motion.  Cardiovascular:     Rate and Rhythm: Normal rate and regular rhythm.     Pulses: Normal pulses.  Pulmonary:     Effort: Pulmonary effort is normal.     Breath sounds: Normal breath sounds.  Chest:     Chest wall: No tenderness.  Musculoskeletal: Normal range of motion.        General: Tenderness present. No swelling.     Right knee: He exhibits no effusion.       Legs:     Comments: Patient has focal tenderness to palpation of the right popliteal fossa and proximal right calf.  No appreciable edema, no erythema or excessive warmth.  Pain is reproduced on flexion of the knee.  Negative drawer sign.   Negative Thompson test  Skin:    General: Skin is warm.     Capillary Refill: Capillary refill takes less than 2 seconds.     Findings: No rash.  Neurological:     General: No focal deficit present.     Mental Status: He is alert.      ED Treatments / Results  Labs (all labs ordered are listed, but only abnormal results are displayed) Labs Reviewed - No data  to display  EKG None  Radiology Dg Knee Complete 4 Views Right  Result Date: 10/01/2018 CLINICAL DATA:  Posterior knee pain EXAM: RIGHT KNEE - COMPLETE 4+ VIEW COMPARISON:  April 11, 2018 FINDINGS: There are advanced degenerative changes of the right knee with moderate to severe osteoarthritis involving the medial and patellofemoral compartments. There is a small joint effusion. There are probable intra-articular loose bodies within the posterior joint space. There is no acute displaced fracture. There is no dislocation. IMPRESSION: 1. No acute osseous abnormality. 2. Moderate multicompartmental osteoarthritis. 3. Small joint effusion. Electronically Signed   By: Constance Holster M.D.   On: 10/01/2018 23:17    Procedures Procedures (including critical care time)  Medications Ordered in ED Medications - No data to display   Initial Impression / Assessment and Plan / ED Course  I have reviewed the triage vital signs and the nursing notes.  Pertinent labs & imaging results that were available during my care of the patient were reviewed by me and considered in my medical decision making (see chart for details).        Patient with sudden onset of pain of the posterior right leg and knee.  No appreciable edema or palpable effusion.  Neurovascularly intact.  No erythema or tenderness of the calf.  He is currently anticoagulated with Eliquis.  I have scheduled patient to return here tomorrow morning for venous ultrasound of the right leg.  He agrees to this plan, he will call the scheduling department to arrange appointment time.  He will follow-up with his PCP and/or orthopedic provider.  Final Clinical Impressions(s) / ED Diagnoses   Final diagnoses:  Right leg pain    ED Discharge Orders         Ordered    US Venous Img Lower Unilateral Right     10/01/18 2206  Kem Parkinson, PA-C 10/02/18 0039    Noemi Chapel, MD 10/02/18 [phone]

## 2018-10-01 NOTE — ED Triage Notes (Signed)
Pt reports sudden onset of right thigh pain x 1-2 hours ago, denies injury

## 2018-10-01 NOTE — Discharge Instructions (Addendum)
You are scheduled to return here tomorrow to have an ultrasound of your right leg.  Call the radiology scheduling at [phone] tomorrow morning to arrange an appointment time.  Follow-up with your primary doctor or your orthopedic provider later this week.

## 2018-10-02 ENCOUNTER — Ambulatory Visit (HOSPITAL_COMMUNITY)
Admission: RE | Admit: 2018-10-02 | Discharge: 2018-10-02 | Disposition: A | Discharge status: Home / Self Care | Payer: Medicare Other | Source: Ambulatory Visit | Attending: Emergency Medicine | Admitting: Emergency Medicine

## 2018-10-02 DIAGNOSIS — M79604 Pain in right leg: Secondary | ICD-10-CM | POA: Diagnosis not present

## 2018-10-02 MED FILL — Hydrocodone-Acetaminophen Tab 5-325 MG: ORAL | Qty: 6 | Status: AC

## 2018-10-06 ENCOUNTER — Ambulatory Visit (INDEPENDENT_AMBULATORY_CARE_PROVIDER_SITE_OTHER): Payer: Medicare Other | Admitting: Family Medicine

## 2018-10-06 ENCOUNTER — Other Ambulatory Visit: Payer: Self-pay

## 2018-10-06 ENCOUNTER — Ambulatory Visit (INDEPENDENT_AMBULATORY_CARE_PROVIDER_SITE_OTHER): Payer: Medicare Other

## 2018-10-06 VITALS — BP 131/76 | HR 70 | Temp 97.3°F

## 2018-10-06 DIAGNOSIS — R609 Edema, unspecified: Secondary | ICD-10-CM

## 2018-10-06 DIAGNOSIS — M25561 Pain in right knee: Secondary | ICD-10-CM

## 2018-10-06 DIAGNOSIS — M1711 Unilateral primary osteoarthritis, right knee: Secondary | ICD-10-CM | POA: Diagnosis not present

## 2018-10-06 MED ORDER — PREDNISONE 10 MG PO TABS: ORAL_TABLET | ORAL | 0 refills | Status: DC

## 2018-10-06 MED ORDER — TRAMADOL HCL 50 MG PO TABS: 50.0000 mg | ORAL_TABLET | Freq: Four times a day (QID) | ORAL | 0 refills | Status: AC

## 2018-10-06 NOTE — Progress Notes (Signed)
Chief Complaint  Patient presents with  . Knee Pain    HPI  Patient presents today for increased pain, stiffness and swelling in the left knee. NKI.   PMH: Smoking status noted ROS: Per HPI  Objective: BP 131/76   Pulse 70   Temp (!) 97.3 F (36.3 C) (Temporal)  Gen: NAD, alert, cooperative with exam HEENT: NCAT, EOMI, PERRL CV: RRR, good S1/S2, no murmur Resp: CTABL, no wheezes, non-labored Abd: SNTND, BS present, no guarding or organomegaly Ext: No edema, warm. Tender ROm at the left knee.2+ edema/ effusion. Edema extends down leg Neuro: Alert and oriented, No gross deficits  Assessment and plan:  1. Edema, unspecified type     Meds ordered this encounter  Medications  . traMADol (ULTRAM) 50 MG tablet    Sig: Take 1 tablet (50 mg total) by mouth 4 (four) times daily for 5 days. 1-2 tablets up to 4 times a day as needed for pain    Dispense:  20 tablet    Refill:  0  . predniSONE (DELTASONE) 10 MG tablet    Sig: Take 5 daily for 3 days followed by 4,3,2 and 1 for 3 days each.    Dispense:  45 tablet    Refill:  0    Orders Placed This Encounter  Procedures  . DG Knee 1-2 Views Right    Standing Status:   Future    Number of Occurrences:   1    Standing Expiration Date:   12/06/2019    Order Specific Question:   Reason for Exam (SYMPTOM  OR DIAGNOSIS REQUIRED)    Answer:   edema    Order Specific Question:   Preferred imaging location?    Answer:   Internal    Follow up as needed.  Claretta Fraise, MD

## 2018-10-12 ENCOUNTER — Encounter: Payer: Self-pay | Admitting: Family Medicine

## 2018-10-17 ENCOUNTER — Ambulatory Visit: Payer: Medicare Other | Admitting: Family Medicine

## 2018-10-19 ENCOUNTER — Ambulatory Visit (INDEPENDENT_AMBULATORY_CARE_PROVIDER_SITE_OTHER): Payer: Medicare Other | Admitting: Orthopaedic Surgery

## 2018-10-19 ENCOUNTER — Encounter: Payer: Self-pay | Admitting: Orthopaedic Surgery

## 2018-10-19 ENCOUNTER — Other Ambulatory Visit: Payer: Self-pay

## 2018-10-19 VITALS — BP 150/88 | HR 80 | Ht 71.0 in | Wt 230.8 lb

## 2018-10-19 DIAGNOSIS — M1711 Unilateral primary osteoarthritis, right knee: Secondary | ICD-10-CM

## 2018-10-19 DIAGNOSIS — Z96652 Presence of left artificial knee joint: Secondary | ICD-10-CM

## 2018-10-19 NOTE — Progress Notes (Signed)
Office Visit Note   Patient: [name]           Date of Birth: September 28, 1935           MRN: [id number] Visit Date: 10/19/2018              Requested by: Claretta Fraise, MD [place],  [place] [address] PCP: Claretta Fraise, MD   Assessment & Plan: Visit Diagnoses:  1. S/P total knee arthroplasty, left   2. Unilateral primary osteoarthritis, right knee     Plan: Swelling is down in his knee with Medrol Dosepak.  He is not having any locking still has varus deformity and is limping.  He like to return in 3 weeks would get a cortisone injection before he goes on the golf trip with his son to [place].  He like to look at total knee arthroplasty right knee sometime in January.  Follow-Up Instructions: Return in about 3 weeks (around 11/09/2018).   Orders:  No orders of the defined types were placed in this encounter.  No orders of the defined types were placed in this encounter.     Procedures: No procedures performed   Clinical Data: No additional findings.   Subjective: Chief Complaint  Patient presents with  . Right Knee - Pain    HPI 83 year old male returns with increased problems with his right knee has been on some tramadol and prednisone Dosepak which is helped the swelling he had significant effusion in his knee and primarily pain posteriorly for previous x-rays that showed some loose pieces which appear to be localized in a Baker's cyst.  Patient had a Doppler right lower extremity which was negative for DVT.  X-rays 10/06/2018 showed tricompartment degenerative arthritis bone-on-bone medial compartment large posterior osteophytes and partial calcification of loose bodies in the popliteal region.  Review of Systems 14 point review of system positive coronary disease carotid stenosis.  Negative for DVT.  Left total knee arthroplasty 2018 doing well.  Type 2 diabetes history of TIA brainstem infarct.  Otherwise negative is obtains HPI.   Objective:  Vital Signs: BP (!) 150/88   Pulse 80   Ht 5\' 11"  (1.803 m)   Wt 230 lb 12.8 oz (104.7 kg)   BMI 32.19 kg/m   Physical Exam Constitutional:      Appearance: He is well-developed.  HENT:     Head: Normocephalic and atraumatic.  Eyes:     Pupils: Pupils are equal, round, and reactive to light.  Neck:     Thyroid: No thyromegaly.     Trachea: No tracheal deviation.  Cardiovascular:     Rate and Rhythm: Normal rate.  Pulmonary:     Effort: Pulmonary effort is normal.     Breath sounds: No wheezing.  Abdominal:     General: Bowel sounds are normal.     Palpations: Abdomen is soft.  Skin:    General: Skin is warm and dry.     Capillary Refill: Capillary refill takes less than 2 seconds.  Neurological:     Mental Status: He is alert and oriented to person, place, and time.  Psychiatric:        Behavior: Behavior normal.        Thought Content: Thought content normal.        Judgment: Judgment normal.     Ortho Exam well-healed left total knee arthroplasty midline incision with good range of motion good stability no effusion.  2+ knee effusion right with  varus deformity bone-on-bone palpable osteophytes medially and positive Baker's cyst on exam.  Negative logroll to the hips. Specialty Comments:  No specialty comments available.  Imaging: No results found.   PMFS History: Patient Active Problem List   Diagnosis Date Noted  . Unilateral primary osteoarthritis, right knee 01/15/2016    Priority: Medium  . TIA (transient ischemic attack) 12/05/2017  . Type 2 diabetes mellitus without complication ([place]) Q000111Q  . 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 Diplopia 10/24/2017  . S/P total knee arthroplasty, left 04/29/2016  . Weakness of both arms 07/19/2012  . Colon cancer screening 06/22/2012  . Unspecified venous (peripheral) insufficiency 05/05/2012  . Chronic venous insufficiency 04/07/2012  . Other malaise and fatigue 03/16/2012  . Disturbance of skin  sensation 03/16/2012  . Carotid stenosis 08/16/2011  . Obesity 08/16/2011  . Coronary artery disease excluded   . H/O mitral valve repair   . Endocarditis, valve unspecified, unspecified cause   . Dizziness 12/16/2010  . Dyslipidemia 09/23/2010  . Essential hypertension 04/06/2007  . Sleep apnea 04/06/2007  . DYSPNEA 04/06/2007   Past Medical History:  Diagnosis Date  . Arthritis    knees, shoulder,  hips (10/24/2017)  . Atrial fibrillation ([place])    on Coumadin  . BPH (benign prostatic hyperplasia)   . Coronary artery disease excluded   . Diverticulosis   . Endocarditis, valve unspecified, unspecified cause   . GERD (gastroesophageal reflux disease)   . H/O mitral valve repair    Postoperative ring with postoperative atrial fibrillation  . Heart murmur    hx  . Hiatal hernia 06/13/2002  . High cholesterol   . History of kidney stones    passed spontaneously x2   . OSA on CPAP   . Stroke ([place])   . Unspecified essential hypertension   . Weakness of both arms 07/19/2012    Family History  Problem Relation Age of Onset  . Congestive Heart Failure Father   . Heart disease Father   . Stroke Brother   . Stroke Sister   . Diabetes Mellitus II Brother   . Diabetes Mellitus II Sister   . Lung cancer Brother   . Colon cancer Neg Hx   . Esophageal cancer Neg Hx   . Rectal cancer Neg Hx   . Stomach cancer Neg Hx     Past Surgical History:  Procedure Laterality Date  . CARDIAC CATHETERIZATION  X 2  . CARDIOVERSION N/A 04/14/2016   Procedure: CARDIOVERSION;  Surgeon: Minus Breeding, MD;  Location: [facility] ENDOSCOPY;  Service: Cardiovascular;  Laterality: N/A;  . CATARACT EXTRACTION W/PHACO Right 05/12/2015   Procedure: CATARACT EXTRACTION PHACO AND INTRAOCULAR LENS PLACEMENT RIGHT EYE CDE=7.75;  Surgeon: Tonny Branch, MD;  Location: [facility] ORS;  Service: Ophthalmology;  Laterality: Right;  . CATARACT EXTRACTION W/PHACO Left 06/12/2015   Procedure: CATARACT EXTRACTION PHACO AND INTRAOCULAR  LENS PLACEMENT (IOC);  Surgeon: Tonny Branch, MD;  Location: [facility];  Service: Ophthalmology;  Laterality: Left;  CDE:  13.17  . INJECTION KNEE Right 02/20/2016   Procedure: KNEE INJECTION;  Surgeon: [name], MD;  Location: [facility];  Service: Orthopedics;  Laterality: Right;  . JOINT REPLACEMENT    . KNEE ARTHROSCOPY Left   . LAPAROSCOPIC CHOLECYSTECTOMY    . MITRAL VALVE REPAIR  ~ 2003  . SHOULDER OPEN ROTATOR CUFF REPAIR Left   . TOTAL KNEE ARTHROPLASTY Left 02/20/2016   Procedure: LEFT TOTAL KNEE ARTHROPLASTY WITH RIGHT KNEE INJECTION;  Surgeon: [name], MD;  Location: [facility];  Service: Orthopedics;  Laterality: Left;  Marland Kitchen VASECTOMY     Social History   Occupational History  . Occupation:  Environmental manager    Comment: RETIRED  Tobacco Use  . Smoking status: Former Smoker    Packs/day: 3.00    Years: 35.00    Pack years: 105.00    Types: Cigarettes    Quit date: 02/16/1988    Years since quitting: 30.6  . Smokeless tobacco: Never Used  . Tobacco comment:  Year Quit: 1990   Substance and Sexual Activity  . Alcohol use: No  . Drug use: Never  . Sexual activity: Not Currently

## 2018-11-09 ENCOUNTER — Encounter: Payer: Self-pay | Admitting: Orthopaedic Surgery

## 2018-11-09 ENCOUNTER — Other Ambulatory Visit: Payer: Self-pay

## 2018-11-09 ENCOUNTER — Ambulatory Visit (INDEPENDENT_AMBULATORY_CARE_PROVIDER_SITE_OTHER): Payer: Medicare Other | Admitting: Orthopaedic Surgery

## 2018-11-09 VITALS — BP 146/76 | HR 64 | Ht 71.0 in | Wt 236.0 lb

## 2018-11-09 DIAGNOSIS — M1711 Unilateral primary osteoarthritis, right knee: Secondary | ICD-10-CM

## 2018-11-09 MED ORDER — METHYLPREDNISOLONE ACETATE 40 MG/ML IJ SUSP
40.0000 mg | INTRAMUSCULAR | Status: AC | PRN
  Administered 2018-11-09: 40 mg via INTRA_ARTICULAR

## 2018-11-09 MED ORDER — BUPIVACAINE HCL 0.25 % IJ SOLN
4.0000 mL | INTRAMUSCULAR | Status: AC | PRN
  Administered 2018-11-09: 4 mL via INTRA_ARTICULAR

## 2018-11-09 MED ORDER — LIDOCAINE HCL 1 % IJ SOLN
0.5000 mL | INTRAMUSCULAR | Status: AC | PRN
  Administered 2018-11-09: .5 mL

## 2018-11-09 NOTE — Progress Notes (Signed)
Office Visit Note   Patient: [name]           Date of Birth: Oct 23, 1935           MRN: [id number] Visit Date: 11/09/2018              Requested by: Claretta Fraise, MD [place],  [place] [address] PCP: Claretta Fraise, MD   Assessment & Plan: Visit Diagnoses:  1. Unilateral primary osteoarthritis, right knee     Plan: Injection performed he will call about scheduling right total knee arthroplasty.  He tolerated the injection well.  Follow-Up Instructions: No follow-ups on file.   Orders:  No orders of the defined types were placed in this encounter.  No orders of the defined types were placed in this encounter.     Procedures: Large Joint Inj: R knee on 11/09/2018 2:15 PM Indications: pain and joint swelling Details: 22 G 1.5 in needle, anterolateral approach  Arthrogram: No  Medications: 40 mg methylPREDNISolone acetate 40 MG/ML; 0.5 mL lidocaine 1 %; 4 mL bupivacaine 0.25 % Outcome: tolerated well, no immediate complications Procedure, treatment alternatives, risks and benefits explained, specific risks discussed. Consent was given by the patient. Immediately prior to procedure a time out was called to verify the correct patient, procedure, equipment, support staff and site/side marked as required. Patient was prepped and draped in the usual sterile fashion.       Clinical Data: No additional findings.   Subjective: Chief Complaint  Patient presents with  . Right Knee - Pain    HPI 83 year old male returns post left total knee arthroplasty couple years ago doing well he has right knee osteoarthritis with varus deformity had a previous injection over a year ago and is requesting a repeat injection in his right knee.  He states he is wanting to schedule total knee arthroplasty sometime after the first of the year and will call when he is ready to set this up.  Review of Systems 14 point system update unchanged and noncontributory.   Objective:  Vital Signs: BP (!) 146/76   Pulse 64   Ht 5\' 11"  (1.803 m)   Wt 236 lb (107 kg)   BMI 32.92 kg/m   Physical Exam Constitutional:      Appearance: He is well-developed.  HENT:     Head: Normocephalic and atraumatic.  Eyes:     Pupils: Pupils are equal, round, and reactive to light.  Neck:     Thyroid: No thyromegaly.     Trachea: No tracheal deviation.  Cardiovascular:     Rate and Rhythm: Normal rate.  Pulmonary:     Effort: Pulmonary effort is normal.     Breath sounds: No wheezing.  Abdominal:     General: Bowel sounds are normal.     Palpations: Abdomen is soft.  Skin:    General: Skin is warm and dry.     Capillary Refill: Capillary refill takes less than 2 seconds.  Neurological:     Mental Status: He is alert and oriented to person, place, and time.  Psychiatric:        Behavior: Behavior normal.        Thought Content: Thought content normal.        Judgment: Judgment normal.     Ortho Exam well-healed left total knee with good range of motion no swelling no pain.  Right knee shows varus deformity joint line tenderness crepitus with range of motion.  Specialty Comments:  No specialty comments available.  Imaging: No results found.   PMFS History: Patient Active Problem List   Diagnosis Date Noted  . Unilateral primary osteoarthritis, right knee 01/15/2016    Priority: Medium  . TIA (transient ischemic attack) 12/05/2017  . Type 2 diabetes mellitus without complication ([place]) Q000111Q  . 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 Diplopia 10/24/2017  . S/P total knee arthroplasty, left 04/29/2016  . Weakness of both arms 07/19/2012  . Colon cancer screening 06/22/2012  . Unspecified venous (peripheral) insufficiency 05/05/2012  . Chronic venous insufficiency 04/07/2012  . Other malaise and fatigue 03/16/2012  . Disturbance of skin sensation 03/16/2012  . Carotid stenosis 08/16/2011  . Obesity 08/16/2011  . Coronary artery disease excluded   .  H/O mitral valve repair   . Endocarditis, valve unspecified, unspecified cause   . Dizziness 12/16/2010  . Dyslipidemia 09/23/2010  . Essential hypertension 04/06/2007  . Sleep apnea 04/06/2007  . DYSPNEA 04/06/2007   Past Medical History:  Diagnosis Date  . Arthritis    knees, shoulder,  hips (10/24/2017)  . Atrial fibrillation ([place])    on Coumadin  . BPH (benign prostatic hyperplasia)   . Coronary artery disease excluded   . Diverticulosis   . Endocarditis, valve unspecified, unspecified cause   . GERD (gastroesophageal reflux disease)   . H/O mitral valve repair    Postoperative ring with postoperative atrial fibrillation  . Heart murmur    hx  . Hiatal hernia 06/13/2002  . High cholesterol   . History of kidney stones    passed spontaneously x2   . OSA on CPAP   . Stroke ([place])   . Unspecified essential hypertension   . Weakness of both arms 07/19/2012    Family History  Problem Relation Age of Onset  . Congestive Heart Failure Father   . Heart disease Father   . Stroke Brother   . Stroke Sister   . Diabetes Mellitus II Brother   . Diabetes Mellitus II Sister   . Lung cancer Brother   . Colon cancer Neg Hx   . Esophageal cancer Neg Hx   . Rectal cancer Neg Hx   . Stomach cancer Neg Hx     Past Surgical History:  Procedure Laterality Date  . CARDIAC CATHETERIZATION  X 2  . CARDIOVERSION N/A 04/14/2016   Procedure: CARDIOVERSION;  Surgeon: Minus Breeding, MD;  Location: [facility] ENDOSCOPY;  Service: Cardiovascular;  Laterality: N/A;  . CATARACT EXTRACTION W/PHACO Right 05/12/2015   Procedure: CATARACT EXTRACTION PHACO AND INTRAOCULAR LENS PLACEMENT RIGHT EYE CDE=7.75;  Surgeon: Tonny Branch, MD;  Location: [facility];  Service: Ophthalmology;  Laterality: Right;  . CATARACT EXTRACTION W/PHACO Left 06/12/2015   Procedure: CATARACT EXTRACTION PHACO AND INTRAOCULAR LENS PLACEMENT (IOC);  Surgeon: Tonny Branch, MD;  Location: [facility];  Service: Ophthalmology;  Laterality: Left;  CDE:   13.17  . INJECTION KNEE Right 02/20/2016   Procedure: KNEE INJECTION;  Surgeon: [name], MD;  Location: [facility];  Service: Orthopedics;  Laterality: Right;  . JOINT REPLACEMENT    . KNEE ARTHROSCOPY Left   . LAPAROSCOPIC CHOLECYSTECTOMY    . MITRAL VALVE REPAIR  ~ 2003  . SHOULDER OPEN ROTATOR CUFF REPAIR Left   . TOTAL KNEE ARTHROPLASTY Left 02/20/2016   Procedure: LEFT TOTAL KNEE ARTHROPLASTY WITH RIGHT KNEE INJECTION;  Surgeon: [name], MD;  Location: [facility];  Service: Orthopedics;  Laterality: Left;  Marland Kitchen VASECTOMY     Social History  Occupational History  . Occupation:  Environmental manager    Comment: RETIRED  Tobacco Use  . Smoking status: Former Smoker    Packs/day: 3.00    Years: 35.00    Pack years: 105.00    Types: Cigarettes    Quit date: 02/16/1988    Years since quitting: 30.7  . Smokeless tobacco: Never Used  . Tobacco comment:  Year Quit: 1990   Substance and Sexual Activity  . Alcohol use: No  . Drug use: Never  . Sexual activity: Not Currently

## 2018-11-27 ENCOUNTER — Ambulatory Visit (INDEPENDENT_AMBULATORY_CARE_PROVIDER_SITE_OTHER): Payer: Medicare Other

## 2018-11-27 ENCOUNTER — Encounter: Payer: Self-pay | Admitting: Family Medicine

## 2018-11-27 ENCOUNTER — Ambulatory Visit (INDEPENDENT_AMBULATORY_CARE_PROVIDER_SITE_OTHER): Payer: Medicare Other | Admitting: Family Medicine

## 2018-11-27 ENCOUNTER — Other Ambulatory Visit: Payer: Self-pay

## 2018-11-27 VITALS — BP 138/84 | HR 84 | Temp 94.8°F | Ht 71.0 in | Wt 240.8 lb

## 2018-11-27 DIAGNOSIS — W19XXXA Unspecified fall, initial encounter: Secondary | ICD-10-CM | POA: Diagnosis not present

## 2018-11-27 DIAGNOSIS — M25511 Pain in right shoulder: Secondary | ICD-10-CM

## 2018-11-27 NOTE — Progress Notes (Signed)
BP 138/84   Pulse 84   Temp (!) 94.8 F (34.9 C) (Temporal)   Ht 5\' 11"  (1.803 m)   Wt 240 lb 12.8 oz (109.2 kg)   SpO2 95%   BMI 33.58 kg/m    Subjective:   Patient ID: [name], male    DOB: September 06, 1935, 83 y.o.   MRN: [id number]  HPI: [name] is a 83 y.o. male presenting on 11/27/2018 for Shoulder Pain (right- Patient states he had a fall wed )   HPI Right shoulder pain Patient is coming in with complaints of right shoulder pain is been bothering him over the past week.  He did have a fall when he is going up the stairs about 5 days ago and he fell on that side of his body on the knee and somehow on the arm or shoulder but he cannot recall if he landed directly on the shoulder if he had outstretched but since then he has been having a lot of pain on the lateral aspect of his right shoulder and difficulty raising it up over his head.  Across the body is fine but also reaching behind and actually makes it feel better.  Patient denies any loss of strength or weakness.  Patient does have an orthopedic visit with Dr. Narda Amber office in 3 days  Relevant past medical, surgical, family and social history reviewed and updated as indicated. Interim medical history since our last visit reviewed. Allergies and medications reviewed and updated.  Review of Systems  Constitutional: Negative for chills and fever.  Respiratory: Negative for shortness of breath and wheezing.   Cardiovascular: Negative for chest pain and leg swelling.  Musculoskeletal: Positive for arthralgias and myalgias. Negative for back pain and gait problem.  Skin: Negative for rash.  Neurological: Negative for dizziness, weakness and light-headedness.  All other systems reviewed and are negative.   Per HPI unless specifically indicated above   Allergies as of 11/27/2018      Reactions   Flomax [tamsulosin Hcl] Other (See Comments)   Makes him "swimmy headed"      Medication List       Accurate as  of November 27, 2018  3:36 PM. If you have any questions, ask your nurse or doctor.        STOP taking these medications   predniSONE 10 MG tablet Commonly known as: DELTASONE Stopped by: Fransisca Kaufmann Graysyn Bache, MD     TAKE these medications   amLODipine 2.5 MG tablet Commonly known as: NORVASC Take 1 tablet (2.5 mg total) by mouth daily before breakfast.   apixaban 5 MG Tabs tablet Commonly known as: Eliquis Take 1 tablet (5 mg total) by mouth 2 (two) times daily.   atorvastatin 10 MG tablet Commonly known as: LIPITOR Take 1 tablet (10 mg total) by mouth daily.   CALCIUM PO Take 1 tablet by mouth daily.   celecoxib 200 MG capsule Commonly known as: CeleBREX Take 1 capsule (200 mg total) by mouth daily. With food   Co Q 10 100 MG Caps Take 300 mg by mouth daily.   HYDROcodone-acetaminophen 5-325 MG tablet Commonly known as: NORCO/VICODIN Take 1 tablet by mouth every 4 (four) hours as needed.   lisinopril 20 MG tablet Commonly known as: ZESTRIL Take 1 tablet (20 mg total) by mouth daily.   metFORMIN 500 MG 24 hr tablet Commonly known as: GLUCOPHAGE-XR Take 1 tablet (500 mg total) by mouth daily with breakfast.   multivitamin with minerals  Tabs tablet Take 1 tablet by mouth daily.   omeprazole 20 MG capsule Commonly known as: PRILOSEC Take 20 mg by mouth at bedtime.   TYLENOL 500 MG tablet Generic drug: acetaminophen Take 500 mg by mouth as needed for mild pain.        Objective:   BP 138/84   Pulse 84   Temp (!) 94.8 F (34.9 C) (Temporal)   Ht 5\' 11"  (1.803 m)   Wt 240 lb 12.8 oz (109.2 kg)   SpO2 95%   BMI 33.58 kg/m   Wt Readings from Last 3 Encounters:  11/27/18 240 lb 12.8 oz (109.2 kg)  11/09/18 236 lb (107 kg)  10/19/18 230 lb 12.8 oz (104.7 kg)    Physical Exam Vitals signs and nursing note reviewed.  Constitutional:      General: He is not in acute distress.    Appearance: He is well-developed. He is not diaphoretic.  Eyes:      General: No scleral icterus.    Conjunctiva/sclera: Conjunctivae normal.  Neck:     Thyroid: No thyromegaly.  Pulmonary:     Breath sounds: Normal breath sounds.  Musculoskeletal:     Right shoulder: He exhibits normal range of motion (No decreased range of motion passively but definite pain limitations active range of motion with abduction past 90 degrees), no tenderness (Tenderness to palpation), no swelling, no crepitus, no deformity, normal pulse and normal strength.  Skin:    General: Skin is warm and dry.     Findings: No rash.  Neurological:     Mental Status: He is alert and oriented to person, place, and time.     Coordination: Coordination normal.  Psychiatric:        Behavior: Behavior normal.       Assessment & Plan:   Problem List Items Addressed This Visit    None    Visit Diagnoses    Fall, initial encounter    -  Primary   Relevant Orders   DG Shoulder Right   Acute pain of right shoulder       Likely deep contusion versus small tear of possibly the rotator cuff versus the deltoid     Patient has an appointment with orthopedic on Thursday so he wants to hold off on any steroids or anything for now but x-ray does not show any acute bony abnormalities, he has an appointment with the office of Dr. Rodell Perna on Thursday at [facility] [place] a short course of prednisone but patient was reluctant because he is going to see orthopedic on Thursday  Follow up plan: Return if symptoms worsen or fail to improve.  Counseling provided for all of the vaccine components Orders Placed This Encounter  Procedures  . DG Shoulder Right    Caryl Pina, MD [facility] 11/27/2018, 3:36 PM

## 2018-11-29 NOTE — Progress Notes (Signed)
HPI  The patient presents for evaluation of mitral valve repair.  Echo 10/2015 demonstrated stable MV repair.  While in the hospital in 2017 for knee surgery he developed persistent atrial fib.  He had cardioversion but went back into atrial fib.  He wore a Holter monitor which demonstrated frequent 3.5 second pauses at night. His average heart rate was about 80.   He is treated for sleep apnea.  In 2019 he had some double vision and was told he could have had a "small stroke."  His INR was therapeutic. Follow up echo was normal.  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  He was also seen by neurology.    He return for follow up.  Since I last saw him he has done well.  The patient denies any new symptoms such as chest discomfort, neck or arm discomfort. There has been no new shortness of breath, PND or orthopnea. There have been no reported palpitations, presyncope or syncope.  He has not been to the gym.  He supposed to have a knee replacement that was put off.  He fell on his outside stairs recently and injured his shoulder and this is probably getting need to be fixed before his knee gets fixed.  Despite all this he has had no cardiovascular complaints.    Allergies  Allergen Reactions  . Flomax [Tamsulosin Hcl] Other (See Comments)    Makes him "swimmy headed"    Current Outpatient Medications  Medication Sig Dispense Refill  . acetaminophen (TYLENOL) 500 MG tablet Take 500 mg by mouth as needed for mild pain.     Marland Kitchen amLODipine (NORVASC) 2.5 MG tablet Take 1 tablet (2.5 mg total) by mouth daily before breakfast. 90 tablet 1  . apixaban (ELIQUIS) 5 MG TABS tablet Take 1 tablet (5 mg total) by mouth 2 (two) times daily. 180 tablet 1  . CALCIUM PO Take 1 tablet by mouth daily.    . Coenzyme Q10 (CO Q 10) 100 MG CAPS Take 300 mg by mouth daily.    Marland Kitchen HYDROcodone-acetaminophen (NORCO/VICODIN) 5-325 MG tablet Take 1 tablet by mouth  every 4 (four) hours as needed. 6 tablet 0  . lisinopril (ZESTRIL) 20 MG tablet Take 1 tablet (20 mg total) by mouth daily. 90 tablet 1  . metFORMIN (GLUCOPHAGE-XR) 500 MG 24 hr tablet Take 1 tablet (500 mg total) by mouth daily with breakfast. 30 tablet 2  . Multiple Vitamin (MULTIVITAMIN WITH MINERALS) TABS tablet Take 1 tablet by mouth daily.    Marland Kitchen omeprazole (PRILOSEC) 20 MG capsule Take 20 mg by mouth at bedtime.      No current facility-administered medications for this visit.     Past Medical History:  Diagnosis Date  . Arthritis    knees, shoulder,  hips (10/24/2017)  . Atrial fibrillation ([place])    on Coumadin  . BPH (benign prostatic hyperplasia)   . Coronary artery disease excluded   . Diverticulosis   . Endocarditis, valve unspecified, unspecified cause   . GERD (gastroesophageal reflux disease)   . H/O mitral valve repair    Postoperative ring with postoperative atrial fibrillation  . Heart murmur    hx  . Hiatal hernia 06/13/2002  . High cholesterol   . History of kidney stones    passed spontaneously x2   . OSA on CPAP   . Stroke ([place])   . Unspecified essential hypertension   . Weakness of both arms  07/19/2012    Past Surgical History:  Procedure Laterality Date  . CARDIAC CATHETERIZATION  X 2  . CARDIOVERSION N/A 04/14/2016   Procedure: CARDIOVERSION;  Surgeon: Minus Breeding, MD;  Location: [facility] ENDOSCOPY;  Service: Cardiovascular;  Laterality: N/A;  . CATARACT EXTRACTION W/PHACO Right 05/12/2015   Procedure: CATARACT EXTRACTION PHACO AND INTRAOCULAR LENS PLACEMENT RIGHT EYE CDE=7.75;  Surgeon: Tonny Branch, MD;  Location: [facility];  Service: Ophthalmology;  Laterality: Right;  . CATARACT EXTRACTION W/PHACO Left 06/12/2015   Procedure: CATARACT EXTRACTION PHACO AND INTRAOCULAR LENS PLACEMENT (IOC);  Surgeon: Tonny Branch, MD;  Location: [facility];  Service: Ophthalmology;  Laterality: Left;  CDE:  13.17  . INJECTION KNEE Right 02/20/2016   Procedure: KNEE INJECTION;  Surgeon:  [name], MD;  Location: [place];  Service: Orthopedics;  Laterality: Right;  . JOINT REPLACEMENT    . KNEE ARTHROSCOPY Left   . LAPAROSCOPIC CHOLECYSTECTOMY    . MITRAL VALVE REPAIR  ~ 2003  . SHOULDER OPEN ROTATOR CUFF REPAIR Left   . TOTAL KNEE ARTHROPLASTY Left 02/20/2016   Procedure: LEFT TOTAL KNEE ARTHROPLASTY WITH RIGHT KNEE INJECTION;  Surgeon: [name], MD;  Location: [place];  Service: Orthopedics;  Laterality: Left;  Marland Kitchen VASECTOMY      ROS: Positive for none.    .Otherwise as stated in the HPI and negative for all other systems.  PHYSICAL EXAM BP (!) 175/93   Pulse 66   Temp (!) 97 F (36.1 C)   Ht 5\' 11"  (1.803 m)   Wt 240 lb 9.6 oz (109.1 kg)   SpO2 95%   BMI 33.56 kg/m   GENERAL:  Well appearing NECK:  No jugular venous distention, waveform within normal limits, carotid upstroke brisk and symmetric, no bruits, no thyromegaly LUNGS:  Clear to auscultation bilaterally CHEST:  Well healed surgical scar.  HEART:  PMI not displaced or sustained,S1 and S2 within normal limits, no S3, no clicks, no rubs, no murmurs ABD:  Flat, positive bowel sounds normal in frequency in pitch, no bruits, no rebound, no guarding, no midline pulsatile mass, no hepatomegaly, no splenomegaly EXT:  2 plus pulses throughout, no edema, no cyanosis no clubbing   EKG: ATRIAL fibrillation, rate 66, axis within normal limits, intervals within normal limits, low voltage in the limb leads, no acute ST-T wave changes.  ASSESSMENT AND PLAN  ATRIAL FIB:    [name] has a CHA2DS2 - VASc of 5.  He has not had any symptoms related to this.  No change in therapy.  He is up-to-date with labs.  SLEEP APNEA:   This is followed by Dr. Celedonio Miyamoto.  No change in therapy.   MITRAL VALVE REPAIR:    He had mild MR on echo in Sept. I reviewed this with him.  He will think we need imaging this year.  I will probably repeat imaging next year.   HTN:  The blood pressure is not at target.  I went through  all of his recent readings with him.  He kind of fluctuates.  At this point I am not can change his meds but he promises to get in 2 weeks of twice daily blood pressures that I can review and he might need to increase his amlodipine.   OBESITY:   We talked about weight loss with diet and exercise.

## 2018-11-30 ENCOUNTER — Ambulatory Visit (INDEPENDENT_AMBULATORY_CARE_PROVIDER_SITE_OTHER): Payer: Medicare Other | Admitting: Surgery

## 2018-11-30 ENCOUNTER — Encounter: Payer: Self-pay | Admitting: Surgery

## 2018-11-30 ENCOUNTER — Other Ambulatory Visit: Payer: Self-pay

## 2018-11-30 VITALS — BP 127/69 | HR 73 | Ht 71.0 in | Wt 235.0 lb

## 2018-11-30 DIAGNOSIS — M25511 Pain in right shoulder: Secondary | ICD-10-CM | POA: Diagnosis not present

## 2018-11-30 NOTE — Progress Notes (Signed)
Office Visit Note   Patient: [name]           Date of Birth: [birth year]           MRN: [id number] Visit Date: 11/30/2018              Requested by: [name] Fraise, MD [place],  [place] [id number] PCP: [name] Fraise, MD   Assessment & Plan: Visit Diagnoses:  1. Acute pain of right shoulder     Plan: Patient states that he recently bought a sling.  I think he can use this for comfort.  He will try to maintain range of motion but no aggressive activity.  Want to give this a little bit more time to see if this settles down on its own.  We will follow up in the clinic next week with [name] for repeat exam.  If he continues to have the same symptoms I would recommend getting a right shoulder MRI to rule out rotator cuff tear and other shoulder pathology due to the significant impact that he had to the shoulder.  Follow-Up Instructions: Return for [name] next Wednesday.   Orders:  No orders of the defined types were placed in this encounter.  No orders of the defined types were placed in this encounter.     Procedures: No procedures performed   Clinical Data: No additional findings.   Subjective: Chief Complaint  Patient presents with  . Right Shoulder - Pain    DOI 11/22/2018    HPI 83 year old white male comes in today with complaints of right shoulder pain.  Patient states [name] 08/2018 he was in his home when he stumbled causing him to fall with a direct impact onto the right lateral shoulder.  Patient states that he had immediate pain throughout the shoulder after this injury.  Pain with overhead activity and reaching on his back.  No cervical spinal radicular component.  He was seen by his primary care physician November 27, 2018 and had right shoulder x-ray.  Report showed degenerative changes in the right acromioclavicular and glenohumeral joints.  No acute bony abnormality.  Specifically no fracture, subluxation or dislocation.   Patient states that he has had slight improvement but shoulder continues to be bothersome. Review of Systems No current cardiac pulmonary GI GU issues  Objective: Vital Signs: BP 127/69   Pulse 73   Ht 5\' 11"  (1.803 m)   Wt 235 lb (106.6 kg)   BMI 32.78 kg/m   Physical Exam Eyes:     Extraocular Movements: Extraocular movements intact.     Pupils: Pupils are equal, round, and reactive to light.  Musculoskeletal:     Comments: Right shoulder he has good range of motion with flexion/abduction but with discomfort.  Some limitation with internal rotation about his back due to pain.  Negative drop arm test.  Positive impingement test.  He does have pain and some weakness with supraspinatus resistance.  Neurovascular intact  Neurological:     General: No focal deficit present.     Mental Status: He is alert and oriented to person, place, and time.  Psychiatric:        Mood and Affect: Mood normal.     Ortho Exam  Specialty Comments:  No specialty comments available.  Imaging: No results found.   PMFS History: Patient Active Problem List   Diagnosis Date Noted  . TIA (transient ischemic attack) 12/05/2017  . Type 2 diabetes mellitus without  complication ([place]) Q000111Q  . 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 Diplopia 10/24/2017  . S/P total knee arthroplasty, left 04/29/2016  . Unilateral primary osteoarthritis, right knee 01/15/2016  . Weakness of both arms 07/19/2012  . Colon cancer screening 06/22/2012  . Unspecified venous (peripheral) insufficiency 05/05/2012  . Chronic venous insufficiency 04/07/2012  . Other malaise and fatigue 03/16/2012  . Disturbance of skin sensation 03/16/2012  . Carotid stenosis 08/16/2011  . Obesity 08/16/2011  . Coronary artery disease excluded   . H/O mitral valve repair   . Endocarditis, valve unspecified, unspecified cause   . Dizziness 12/16/2010  . Dyslipidemia 09/23/2010  . Essential hypertension 04/06/2007  . Sleep apnea  04/06/2007  . DYSPNEA 04/06/2007   Past Medical History:  Diagnosis Date  . Arthritis    knees, shoulder,  hips (10/24/2017)  . Atrial fibrillation ([place])    on Coumadin  . BPH (benign prostatic hyperplasia)   . Coronary artery disease excluded   . Diverticulosis   . Endocarditis, valve unspecified, unspecified cause   . GERD (gastroesophageal reflux disease)   . H/O mitral valve repair    Postoperative ring with postoperative atrial fibrillation  . Heart murmur    hx  . Hiatal hernia 06/13/2002  . High cholesterol   . History of kidney stones    passed spontaneously x2   . OSA on CPAP   . Stroke ([place])   . Unspecified essential hypertension   . Weakness of both arms 07/19/2012    Family History  Problem Relation Age of Onset  . Congestive Heart Failure Father   . Heart disease Father   . Stroke Brother   . Stroke Sister   . Diabetes Mellitus II Brother   . Diabetes Mellitus II Sister   . Lung cancer Brother   . Colon cancer Neg Hx   . Esophageal cancer Neg Hx   . Rectal cancer Neg Hx   . Stomach cancer Neg Hx     Past Surgical History:  Procedure Laterality Date  . CARDIAC CATHETERIZATION  X 2  . CARDIOVERSION N/A 04/14/2016   Procedure: CARDIOVERSION;  Surgeon: Minus Breeding, MD;  Location: [facility] ENDOSCOPY;  Service: Cardiovascular;  Laterality: N/A;  . CATARACT EXTRACTION W/PHACO Right 05/12/2015   Procedure: CATARACT EXTRACTION PHACO AND INTRAOCULAR LENS PLACEMENT RIGHT EYE CDE=7.75;  Surgeon: Tonny Branch, MD;  Location: [facility];  Service: Ophthalmology;  Laterality: Right;  . CATARACT EXTRACTION W/PHACO Left 06/12/2015   Procedure: CATARACT EXTRACTION PHACO AND INTRAOCULAR LENS PLACEMENT (IOC);  Surgeon: Tonny Branch, MD;  Location: [facility];  Service: Ophthalmology;  Laterality: Left;  CDE:  13.17  . INJECTION KNEE Right 02/20/2016   Procedure: KNEE INJECTION;  Surgeon: [name], MD;  Location: [place];  Service: Orthopedics;  Laterality: Right;  . JOINT REPLACEMENT    .  KNEE ARTHROSCOPY Left   . LAPAROSCOPIC CHOLECYSTECTOMY    . MITRAL VALVE REPAIR  ~ 2003  . SHOULDER OPEN ROTATOR CUFF REPAIR Left   . TOTAL KNEE ARTHROPLASTY Left 02/20/2016   Procedure: LEFT TOTAL KNEE ARTHROPLASTY WITH RIGHT KNEE INJECTION;  Surgeon: [name], MD;  Location: [place];  Service: Orthopedics;  Laterality: Left;  Marland Kitchen VASECTOMY     Social History   Occupational History  . Occupation:  Environmental manager    Comment: RETIRED  Tobacco Use  . Smoking status: Former Smoker    Packs/day: 3.00    Years: 35.00    Pack years: 105.00  Types: Cigarettes    Quit date: 02/16/1988    Years since quitting: 30.8  . Smokeless tobacco: Never Used  . Tobacco comment:  Year Quit: 1990   Substance and Sexual Activity  . Alcohol use: No  . Drug use: Never  . Sexual activity: Not Currently

## 2018-12-01 ENCOUNTER — Encounter: Payer: Self-pay | Admitting: Cardiology

## 2018-12-01 ENCOUNTER — Ambulatory Visit: Payer: Medicare Other | Admitting: Cardiology

## 2018-12-01 VITALS — BP 175/93 | HR 66 | Temp 97.0°F | Ht 71.0 in | Wt 240.6 lb

## 2018-12-01 DIAGNOSIS — I482 Chronic atrial fibrillation, unspecified: Secondary | ICD-10-CM | POA: Diagnosis not present

## 2018-12-01 DIAGNOSIS — I10 Essential (primary) hypertension: Secondary | ICD-10-CM | POA: Diagnosis not present

## 2018-12-01 DIAGNOSIS — Z8249 Family history of ischemic heart disease and other diseases of the circulatory system: Secondary | ICD-10-CM

## 2018-12-01 NOTE — Patient Instructions (Addendum)
Follow-Up: At [facility], you and your health needs are our priority.  As part of our continuing mission to provide you with exceptional heart care, we have created designated Provider Care Teams.  These Care Teams include your primary Cardiologist (physician) and Advanced Practice Providers (APPs -  Physician Assistants and Nurse Practitioners) who all work together to provide you with the care you need, when you need it.  Your next appointment:   12 months.  Please call our office in advance, august 2021 to schedule this October 2021 appointment.  The format for your next appointment:   In Person  Provider:   You may see Minus Breeding, MD or one of the following Advanced Practice Providers on your designated Care Team:    Rosaria Ferries, PA-C  Jory Sims, DNP, ANP  Cadence Kathlen Mody, NP   Medication Instructions:  The current medical regimen is effective;  continue present plan and medications as directed. Please refer to the Current Medication list given to you today. If you need a refill on your cardiac medications before your next appointment, please call your pharmacy. Labwork: When you have labs (blood work) and your tests are completely normal, you will receive your results ONLY by [place] (if you have MyChart) -OR- A paper copy in the mail.  At [facility], you and your health needs are our priority.  As part of our continuing mission to provide you with exceptional heart care, we have created designated Provider Care Teams.  These Care Teams include your primary Cardiologist (physician) and Advanced Practice Providers (APPs -  Physician Assistants and Nurse Practitioners) who all work together to provide you with the care you need, when you need it.  Thank you for choosing [facility] at [facility]!!

## 2018-12-06 ENCOUNTER — Encounter: Payer: Self-pay | Admitting: Orthopaedic Surgery

## 2018-12-06 ENCOUNTER — Other Ambulatory Visit: Payer: Self-pay

## 2018-12-06 ENCOUNTER — Ambulatory Visit (INDEPENDENT_AMBULATORY_CARE_PROVIDER_SITE_OTHER): Payer: Medicare Other | Admitting: Orthopaedic Surgery

## 2018-12-06 DIAGNOSIS — M25511 Pain in right shoulder: Secondary | ICD-10-CM | POA: Diagnosis not present

## 2018-12-06 MED ORDER — LIDOCAINE HCL 1 % IJ SOLN
2.0000 mL | INTRAMUSCULAR | Status: AC | PRN
  Administered 2018-12-06: 2 mL

## 2018-12-06 MED ORDER — BUPIVACAINE HCL 0.5 % IJ SOLN
2.0000 mL | INTRAMUSCULAR | Status: AC | PRN
  Administered 2018-12-06: 2 mL via INTRA_ARTICULAR

## 2018-12-06 MED ORDER — METHYLPREDNISOLONE ACETATE 40 MG/ML IJ SUSP
80.0000 mg | INTRAMUSCULAR | Status: AC | PRN
  Administered 2018-12-06: 80 mg via INTRA_ARTICULAR

## 2018-12-06 NOTE — Progress Notes (Signed)
Office Visit Note   Patient: [name]           Date of Birth: 1936/01/29           MRN: [id number] Visit Date: 12/06/2018              Requested by: Claretta Fraise, MD [place],  [place] [id number] PCP: Claretta Fraise, MD   Assessment & Plan: Visit Diagnoses:  1. Acute pain of right shoulder     Plan: ACute onset of right shoulder pain approximately 2 weeks ago after an injury.  Still having some difficulty raising his arm over his head.  Films demonstrate a small inferior humeral head spur consistent with osteoarthritis.  This certainly is a possibility of an associated rotator cuff tear.  Will inject the glenohumeral joint with cortisone and monitor response over the next 1 to 2 weeks.  [name] will call and we could schedule an MRI scan.  Follow-Up Instructions: Return in about 2 weeks (around 12/20/2018), or if no improvement.   Orders:  Orders Placed This Encounter  Procedures  . Large Joint Inj: R glenohumeral   No orders of the defined types were placed in this encounter.     Procedures: Large Joint Inj: R glenohumeral on 12/06/2018 11:29 AM Indications: pain and diagnostic evaluation Details: 25 G 1.5 in needle, medial approach  Arthrogram: No  Medications: 2 mL lidocaine 1 %; 2 mL bupivacaine 0.5 %; 80 mg methylPREDNISolone acetate 40 MG/ML Consent was given by the patient. Immediately prior to procedure a time out was called to verify the correct patient, procedure, equipment, support staff and site/side marked as required. Patient was prepped and draped in the usual sterile fashion.       Clinical Data: No additional findings.   Subjective: Chief Complaint  Patient presents with  . Right Shoulder - Follow-up    DOI 11/22/2018  Patient presents today for a one week follow up on his right shoulder. He saw Benjiman Core last week following a fall on 11/22/2018. He said that his shoulder is no better. He cannot lift his arm. His pain is  located in his superior shoulder and proximal arm. He is right hand dominant. No numbness, tingling, or weakness. He takes tylenol as needed. He had x-rays done at [facility] before his appointment with [name]. I reviewed the films in the PACS system.  There is a small inferior humeral head spur consistent with osteoarthritis.  Humeral head is centered about the glenoid.  There are degenerative changes at the acromioclavicular joint.  No acute changes or ectopic calcification. Not having any significant problem with the shoulder prior to this injury.  HPI  Review of Systems   Objective: Vital Signs: Ht 5\' 11"  (1.803 m)   Wt 235 lb (106.6 kg)   BMI 32.78 kg/m   Physical Exam Constitutional:      Appearance: He is well-developed.  Eyes:     Pupils: Pupils are equal, round, and reactive to light.  Pulmonary:     Effort: Pulmonary effort is normal.  Skin:    General: Skin is warm and dry.  Neurological:     Mental Status: He is alert and oriented to person, place, and time.  Psychiatric:        Behavior: Behavior normal.     Ortho Exam awake alert and oriented x3.  Comfortable sitting.  Having difficulty raising his right arm over his head.  I can place his arm  over his head he can maintain that position.  Negative Speed sign.  Positive impingement.  There was some loss of external rotation.  No crepitation.  Some pain along the anterior subacromial region.  Skin intact  Specialty Comments:  No specialty comments available.  Imaging: No results found.   PMFS History: Patient Active Problem List   Diagnosis Date Noted  . Pain in right shoulder 12/06/2018  . TIA (transient ischemic attack) 12/05/2017  . Type 2 diabetes mellitus without complication ([place]) Q000111Q  . 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 Diplopia 10/24/2017  . S/P total knee arthroplasty, left 04/29/2016  . Unilateral primary osteoarthritis, right knee 01/15/2016  . Weakness of both arms  07/19/2012  . Colon cancer screening 06/22/2012  . Unspecified venous (peripheral) insufficiency 05/05/2012  . Chronic venous insufficiency 04/07/2012  . Other malaise and fatigue 03/16/2012  . Disturbance of skin sensation 03/16/2012  . Carotid stenosis 08/16/2011  . Obesity 08/16/2011  . Coronary artery disease excluded   . H/O mitral valve repair   . Endocarditis, valve unspecified, unspecified cause   . Dizziness 12/16/2010  . Dyslipidemia 09/23/2010  . Essential hypertension 04/06/2007  . Sleep apnea 04/06/2007  . DYSPNEA 04/06/2007   Past Medical History:  Diagnosis Date  . Arthritis    knees, shoulder,  hips (10/24/2017)  . Atrial fibrillation ([place])    on Coumadin  . BPH (benign prostatic hyperplasia)   . Coronary artery disease excluded   . Diverticulosis   . Endocarditis, valve unspecified, unspecified cause   . GERD (gastroesophageal reflux disease)   . H/O mitral valve repair    Postoperative ring with postoperative atrial fibrillation  . Heart murmur    hx  . Hiatal hernia 06/13/2002  . High cholesterol   . History of kidney stones    passed spontaneously x2   . OSA on CPAP   . Stroke ([place])   . Unspecified essential hypertension   . Weakness of both arms 07/19/2012    Family History  Problem Relation Age of Onset  . Congestive Heart Failure Father   . Heart disease Father   . Stroke Brother   . Stroke Sister   . Diabetes Mellitus II Brother   . Diabetes Mellitus II Sister   . Lung cancer Brother   . Colon cancer Neg Hx   . Esophageal cancer Neg Hx   . Rectal cancer Neg Hx   . Stomach cancer Neg Hx     Past Surgical History:  Procedure Laterality Date  . CARDIAC CATHETERIZATION  X 2  . CARDIOVERSION N/A 04/14/2016   Procedure: CARDIOVERSION;  Surgeon: Minus Breeding, MD;  Location: [facility] ENDOSCOPY;  Service: Cardiovascular;  Laterality: N/A;  . CATARACT EXTRACTION W/PHACO Right 05/12/2015   Procedure: CATARACT EXTRACTION PHACO AND INTRAOCULAR LENS  PLACEMENT RIGHT EYE CDE=7.75;  Surgeon: Tonny Branch, MD;  [facility];  Service: Ophthalmology;  Laterality: Right;  . CATARACT EXTRACTION W/PHACO Left 06/12/2015   Procedure: CATARACT EXTRACTION PHACO AND INTRAOCULAR LENS PLACEMENT (IOC);  Surgeon: Tonny Branch, MD;  Location: [facility];  Service: Ophthalmology;  Laterality: Left;  CDE:  13.17  . INJECTION KNEE Right 02/20/2016   Procedure: KNEE INJECTION;  Surgeon: [name], MD;  Location: [place];  Service: Orthopedics;  Laterality: Right;  . JOINT REPLACEMENT    . KNEE ARTHROSCOPY Left   . LAPAROSCOPIC CHOLECYSTECTOMY    . MITRAL VALVE REPAIR  ~ 2003  . SHOULDER OPEN ROTATOR CUFF REPAIR Left   . TOTAL  KNEE ARTHROPLASTY Left 02/20/2016   Procedure: LEFT TOTAL KNEE ARTHROPLASTY WITH RIGHT KNEE INJECTION;  Surgeon: [name], MD;  Location: [place];  Service: Orthopedics;  Laterality: Left;  Marland Kitchen VASECTOMY     Social History   Occupational History  . Occupation:  Environmental manager    Comment: RETIRED  Tobacco Use  . Smoking status: Former Smoker    Packs/day: 3.00    Years: 35.00    Pack years: 105.00    Types: Cigarettes    Quit date: 02/16/1988    Years since quitting: 30.8  . Smokeless tobacco: Never Used  . Tobacco comment:  Year Quit: 1990   Substance and Sexual Activity  . Alcohol use: No  . Drug use: Never  . Sexual activity: Not Currently

## 2018-12-07 ENCOUNTER — Other Ambulatory Visit: Payer: Self-pay | Admitting: Family Medicine

## 2018-12-20 ENCOUNTER — Telehealth: Payer: Self-pay | Admitting: *Deleted

## 2018-12-20 MED ORDER — AMLODIPINE BESYLATE 5 MG PO TABS: 5.0000 mg | ORAL_TABLET | Freq: Every day | ORAL | 3 refills | Status: DC

## 2018-12-20 NOTE — Telephone Encounter (Signed)
Receiving a BP diary from pt which as given to Dr Percival Spanish for review. BPs ranged from 154/90 to 126/73  HR 63 to 81.  Per Dr Percival Spanish, increase Norvasc to 5 mg po daily.

## 2018-12-20 NOTE — Telephone Encounter (Signed)
Pt aware of increase of Amlodipine to 5 mg daily.  Rx sent into [facility] as requested.  He will let us know if no improvement in BP.

## 2018-12-21 ENCOUNTER — Telehealth: Payer: Self-pay | Admitting: Orthopaedic Surgery

## 2018-12-21 DIAGNOSIS — M25511 Pain in right shoulder: Secondary | ICD-10-CM

## 2018-12-21 NOTE — Telephone Encounter (Signed)
Patient's wife [name] called advised the patient's shoulder is not any better and want to get an MRI done. The number to contact [name] is [phone]

## 2018-12-21 NOTE — Telephone Encounter (Signed)
Ok proceed thanks. ROV after scan

## 2018-12-21 NOTE — Telephone Encounter (Signed)
Please advise. OK for order? 

## 2018-12-22 NOTE — Telephone Encounter (Signed)
I called and spoke with patient. He would like MRI to be done in [place]. Order entered. I advised patient we would work on Print production planner information and facility will call to schedule. He will call to schedule appt for MRI review once scan has been scheduled.

## 2019-01-14 ENCOUNTER — Other Ambulatory Visit: Payer: Self-pay

## 2019-01-14 ENCOUNTER — Ambulatory Visit
Admission: RE | Admit: 2019-01-14 | Discharge: 2019-01-14 | Disposition: A | Discharge status: Home / Self Care | Payer: Medicare Other | Source: Ambulatory Visit | Attending: Orthopaedic Surgery | Admitting: Orthopaedic Surgery

## 2019-01-14 DIAGNOSIS — M75111 Incomplete rotator cuff tear or rupture of right shoulder, not specified as traumatic: Secondary | ICD-10-CM | POA: Diagnosis not present

## 2019-01-14 DIAGNOSIS — M25511 Pain in right shoulder: Secondary | ICD-10-CM

## 2019-01-18 ENCOUNTER — Other Ambulatory Visit: Payer: Self-pay

## 2019-01-18 ENCOUNTER — Encounter: Payer: Self-pay | Admitting: Orthopaedic Surgery

## 2019-01-18 ENCOUNTER — Ambulatory Visit (INDEPENDENT_AMBULATORY_CARE_PROVIDER_SITE_OTHER): Payer: Medicare Other | Admitting: Orthopaedic Surgery

## 2019-01-18 DIAGNOSIS — M67921 Unspecified disorder of synovium and tendon, right upper arm: Secondary | ICD-10-CM | POA: Diagnosis not present

## 2019-01-18 DIAGNOSIS — S46011D Strain of muscle(s) and tendon(s) of the rotator cuff of right shoulder, subsequent encounter: Secondary | ICD-10-CM

## 2019-01-19 ENCOUNTER — Encounter: Payer: Self-pay | Admitting: Orthopaedic Surgery

## 2019-01-19 DIAGNOSIS — M67921 Unspecified disorder of synovium and tendon, right upper arm: Secondary | ICD-10-CM | POA: Insufficient documentation

## 2019-01-19 DIAGNOSIS — M75121 Complete rotator cuff tear or rupture of right shoulder, not specified as traumatic: Secondary | ICD-10-CM | POA: Insufficient documentation

## 2019-01-19 NOTE — Progress Notes (Signed)
Office Visit Note   Patient: [name]           Date of Birth: 1935-02-28           MRN: [id number] Visit Date: 01/18/2019              Requested by: Claretta Fraise, MD [place],  [place] [address] PCP: Claretta Fraise, MD   Assessment & Plan: Visit Diagnoses:  1. Traumatic complete tear of right rotator cuff, subsequent encounter   2. Biceps tendinopathy of right upper extremity     Plan: We reviewed his MRI scan with the patient.  I gave him a copy of the report.  He has had symptoms since a fall in October with progressive weakness pain pain that wakes him up at night.  He cannot sleep on his right side.  Pain with driving outstretch reaching.  He has had anti-inflammatories, Tylenol, previous injection without relief.  Patient states he like to proceed with operative intervention with shoulder arthroscopy outpatient rotator cuff repair.  We discussed his past history he did need to have cardiology clearance with his cardiac problems.  Surgery likely would be done with a suprascapular block and then possibly LMA tube placement in outpatient center.  If he does well with the block he should be able to go home.  Procedure discussed.  We discussed treatment of the biceps tendon as well with either tenodesis versus tendon debridement and release.  Patient understands request to proceed.  Follow-Up Instructions: No follow-ups on file.   Orders:  No orders of the defined types were placed in this encounter.  No orders of the defined types were placed in this encounter.     Procedures: No procedures performed   Clinical Data: No additional findings.   Subjective: Chief Complaint  Patient presents with   Right Shoulder - Pain, Follow-up    MRI Review    HPI 83 year old male returns with ongoing problems with right shoulder pain.  He had acute onset when he had a fall October 7 and had difficulty lifting his arm since that time.  He has pain with outstretched  reaching and overhead lifting.  He has had an injection in the past with only temporary relief.  He has taken Tylenol heat ice without improvement.  MRI scan was obtained and is available for review.  This shows 0.8 cm tear front to back involving the supraspinatus with 1 cm retraction of the near full-thickness tear.  No muscle atrophy.  He also has tendinopathy of the intra-articular portion of long head of the biceps.  Review of Systems patient had previous coronary artery disease, stent placement, brainstem infarct, dyslipidemia, hypertension, mitral valve replacement, previous total knee arthroplasty left doing well.  Type 2 diabetes.  Negative for current chest pain.  Positive hypertension.  GU negative.   Objective: Vital Signs: Ht 5\' 11"  (1.803 m)    Wt 235 lb (106.6 kg)    BMI 32.78 kg/m   Physical Exam Constitutional:      Appearance: He is well-developed.  HENT:     Head: Normocephalic and atraumatic.  Eyes:     Pupils: Pupils are equal, round, and reactive to light.  Neck:     Thyroid: No thyromegaly.     Trachea: No tracheal deviation.  Cardiovascular:     Rate and Rhythm: Normal rate.  Pulmonary:     Effort: Pulmonary effort is normal.     Breath sounds: No wheezing.  Abdominal:  General: Bowel sounds are normal.     Palpations: Abdomen is soft.  Skin:    General: Skin is warm and dry.     Capillary Refill: Capillary refill takes less than 2 seconds.  Neurological:     Mental Status: He is alert and oriented to person, place, and time.  Psychiatric:        Behavior: Behavior normal.        Thought Content: Thought content normal.        Judgment: Judgment normal.     Ortho Exam patient has positive impingement right shoulder.  Pain with resisted supraspinatus testing.  Good cervical range of motion no brachial plexus tenderness negative Spurling.  Long head of the biceps tendon is tender with palpation over the bicipital groove.  Upper extremity reflexes are 2+  and symmetrical.  Specialty Comments:  No specialty comments available.  Imaging:CLINICAL DATA:  Right shoulder and upper arm pain when raising the arm since 11/22/2018. No known injury.  EXAM: MRI OF THE RIGHT SHOULDER WITHOUT CONTRAST  TECHNIQUE: Multiplanar, multisequence MR imaging of the shoulder was performed. No intravenous contrast was administered.  COMPARISON:  None.  FINDINGS: Rotator cuff: Thickening and heterogeneously increased T2 signal in the rotator cuff tendons are severe in the supraspinatus. There is a partial tear of the posterior supraspinatus extending into the anterior infraspinatus measuring approximately 0.8 cm from front to back. The tear is approximately 1 cm medial to the tendon insertion. The tear is near full-thickness with a thin fragment of bursal sided tendon intact. Retraction is 1 cm or less.  Muscles:  No focal atrophy or lesion.  Biceps long head: Intact. Tendinopathy of the intra-articular segment is noted.  Acromioclavicular Joint: Advanced osteoarthritis is seen. Type 2 acromion. A small volume of fluid is present in the subacromial/subdeltoid bursa.  Glenohumeral Joint: Cartilage is thinned and there is a small osteophyte off the humeral head.  Labrum:  The superior labrum is markedly degenerated.  Bones:  No fracture or focal lesion.  Other: None.  IMPRESSION: 1. Rotator cuff tendinopathy with a 0.8 cm from front to back partial tear of the posterior supraspinatus extending into the anterior infraspinatus. The tear is near full-thickness with a thin fragment of bursal sided tendon intact. Retraction is approximately 1 cm. No atrophy. 2. Tendinopathy of the intra-articular long head of biceps without tear. 3. Advanced acromioclavicular and mild glenohumeral osteoarthritis. 4. Small volume of subacromial/subdeltoid fluid compatible with bursitis.   Electronically Signed   By: Inge Rise M.D.   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[place]) 10/25/2017   Diplopia 10/24/2017   S/P total knee arthroplasty, left 04/29/2016   Weakness of both arms 07/19/2012   Colon cancer screening 06/22/2012   Unspecified venous (peripheral) insufficiency 05/05/2012   Chronic venous insufficiency 04/07/2012   Other malaise and fatigue 03/16/2012   Disturbance of skin sensation 03/16/2012   Carotid stenosis 08/16/2011   Obesity 08/16/2011   Coronary artery disease excluded    H/O mitral valve repair    Endocarditis, valve unspecified, unspecified cause  Dizziness 12/16/2010   Dyslipidemia 09/23/2010   Essential hypertension 04/06/2007   Sleep apnea 04/06/2007   DYSPNEA 04/06/2007   Past Medical History:  Diagnosis Date   Arthritis    knees, shoulder,  hips (10/24/2017)   Atrial fibrillation (HCC)    on Coumadin   BPH (benign prostatic hyperplasia)    Coronary artery disease excluded    Diverticulosis    Endocarditis, valve unspecified, unspecified cause    GERD (gastroesophageal reflux disease)    H/O mitral valve repair    Postoperative ring with postoperative atrial fibrillation   Heart murmur    hx   Hiatal hernia 06/13/2002   High cholesterol    History of kidney stones    passed spontaneously x2    OSA on CPAP    Stroke ([place])    Unspecified essential hypertension    Weakness of both arms 07/19/2012    Family History  Problem Relation Age of Onset   Congestive Heart Failure Father    Heart disease Father    Stroke Brother     Stroke Sister    Diabetes Mellitus II Brother    Diabetes Mellitus II Sister    Lung cancer Brother    Colon cancer Neg Hx    Esophageal cancer Neg Hx    Rectal cancer Neg Hx    Stomach cancer Neg Hx     Past Surgical History:  Procedure Laterality Date   CARDIAC CATHETERIZATION  X 2   CARDIOVERSION N/A 04/14/2016   Procedure: CARDIOVERSION;  Surgeon: Minus Breeding, MD;  Location: [facility];  Service: Cardiovascular;  Laterality: N/A;   CATARACT EXTRACTION W/PHACO Right 05/12/2015   Procedure: CATARACT EXTRACTION PHACO AND INTRAOCULAR LENS PLACEMENT RIGHT EYE CDE=7.75;  Surgeon: Tonny Branch, MD;  Location: [facility];  Service: Ophthalmology;  Laterality: Right;   CATARACT EXTRACTION W/PHACO Left 06/12/2015   Procedure: CATARACT EXTRACTION PHACO AND INTRAOCULAR LENS PLACEMENT (IOC);  Surgeon: Tonny Branch, MD;  Location: [facility];  Service: Ophthalmology;  Laterality: Left;  CDE:  13.17   INJECTION KNEE Right 02/20/2016   Procedure: KNEE INJECTION;  Surgeon: [name], MD;  Location: [facility];  Service: Orthopedics;  Laterality: Right;   JOINT REPLACEMENT     KNEE ARTHROSCOPY Left    LAPAROSCOPIC CHOLECYSTECTOMY     MITRAL VALVE REPAIR  ~ 2003   SHOULDER OPEN ROTATOR CUFF REPAIR Left    TOTAL KNEE ARTHROPLASTY Left 02/20/2016   Procedure: LEFT TOTAL KNEE ARTHROPLASTY WITH RIGHT KNEE INJECTION;  Surgeon: [name], MD;  Location: [facility];  Service: Orthopedics;  Laterality: Left;   VASECTOMY     Social History   Occupational History   Occupation:  Environmental manager    Comment: RETIRED  Tobacco Use   Smoking status: Former Smoker    Packs/day: 3.00    Years: 35.00    Pack years: 105.00    Types: Cigarettes    Quit date: 02/16/1988    Years since quitting: 30.9   Smokeless tobacco: Never Used   Tobacco comment:  Year Quit: 1990   Substance and Sexual Activity   Alcohol use: No   Drug use: Never   Sexual activity: Not Currently

## 2019-01-23 ENCOUNTER — Other Ambulatory Visit: Payer: Self-pay

## 2019-01-23 ENCOUNTER — Telehealth: Payer: Self-pay | Admitting: Cardiology

## 2019-01-23 ENCOUNTER — Other Ambulatory Visit (HOSPITAL_COMMUNITY)
Admission: RE | Admit: 2019-01-23 | Discharge: 2019-01-23 | Disposition: A | Discharge status: Home / Self Care | Payer: Medicare Other | Source: Ambulatory Visit | Attending: Orthopaedic Surgery | Admitting: Orthopaedic Surgery

## 2019-01-23 DIAGNOSIS — Z20828 Contact with and (suspected) exposure to other viral communicable diseases: Secondary | ICD-10-CM | POA: Diagnosis present

## 2019-01-23 LAB — SARS CORONAVIRUS 2 (TAT 6-24 HRS): SARS Coronavirus 2: NEGATIVE

## 2019-01-23 NOTE — Telephone Encounter (Signed)
Patient with diagnosis of ATRIAL FIBRILLATION on ELIQUIS for anticoagulation.    Procedure: ROTATOR CUFF REPAIR AND SHOULDER ARTHROSCOPY Date of procedure: TBD  CHADS2-VASc score of  5 (HTN, AGE X 2, stroke/tia x 2 )  CrCl = 53ml/min Platelet count 216  Per office protocol, patient can hold ELIQUIS for 2 days prior to procedure and resume as soon as possible after procedure.  *Due to history of stroke/TIA with most recent TIA on 10/2017, will request approval from cardiologist (Dr Percival Spanish) prior to [place] for 2 days*

## 2019-01-23 NOTE — Telephone Encounter (Signed)
New Message   [name] believes the pt is okay to proceed with surgery but she is not sure if Friday is too soon for surgery because of the Eliquis  She will need an answer today to for the surgery on Friday.  Please advise      [facility] Pre-operative Risk Assessment    Request for surgical clearance:  1. What type of surgery is being performed? Right Shoulder arthroscopy, rotator cuff repair, possible biceps tenodesis or tenotomy about 1.5 hr procedure   2. When is this surgery scheduled? 01/26/19 Pt is not scheduled but they would like to do it for Friday   3. What type of clearance is required (medical clearance vs. Pharmacy clearance to hold med vs. Both)? Both   4. Are there any medications that need to be held prior to surgery and how long? Eliquis to stop today-  5. Practice name and name of physician performing surgery? At [facility] [address], Dr Rodell Perna   6. What is your office phone number [phone]    7.   What is your office fax number [phone]  8.   Anesthesia type (None, local, MAC, general) ? General    [name] 01/23/2019, 8:57 AM  _________________________________________________________________   (provider comments below)

## 2019-01-23 NOTE — Telephone Encounter (Signed)
Sent to pharm for review of anticoagulation and then will contact patient for clearance today.  Kerin Ransom PA-C 01/23/2019 10:34 AM

## 2019-01-23 NOTE — Telephone Encounter (Signed)
   Primary Cardiologist: Minus Breeding, MD  Chart reviewed and patient contacted by phone today as part of pre-operative protocol coverage. Given past medical history and time since last visit, based on ACC/AHA guidelines, [name] would be at acceptable risk for the planned procedure without further cardiovascular testing.   OK to hold Eliquis 2 days pre op.  I relayed this information to the patient.   I will route this recommendation to the requesting party via Epic fax function and remove from pre-op pool.  Please call with questions.  Kerin Ransom, PA-C 01/23/2019, 4:45 PM

## 2019-01-24 ENCOUNTER — Other Ambulatory Visit: Payer: Self-pay

## 2019-01-24 ENCOUNTER — Encounter (HOSPITAL_COMMUNITY): Payer: Self-pay | Admitting: *Deleted

## 2019-01-24 NOTE — Progress Notes (Signed)
Patient denies shortness of breath, fever, cough and chest pain.  PCP - Dr Terrance Mass Cardiologist - Dr Percival Spanish (Clearance in [place] 01/23/19)   Chest x-ray - denies EKG - DOS 01/26/19 Stress Test - 10/29/10 ECHO - 10/24/17 Cardiac Cath -02/14/07  Last dose of Eliquis was on Tuesday, 01/23/19.  Sleep Study - Yes CPAP -  Uses CPAP nightly  Fasting Blood Sugar -  Unknown Checks Blood Sugar _0_ times a day  . Do not take oral diabetes medicines (Metformin) the morning of surgery.  Anesthesia review: Yes  STOP now taking any Aspirin (unless otherwise instructed by your surgeon), Aleve, Naproxen, Ibuprofen, Motrin, Advil, Goody's, BC's, all herbal medications, fish oil, and all vitamins.   Coronavirus Screening Covid test on 01/23/19 was negative.  Patient verbalized understanding of instructions that were given via phone.

## 2019-01-25 ENCOUNTER — Other Ambulatory Visit: Payer: Self-pay

## 2019-01-25 NOTE — Progress Notes (Signed)
Anesthesia Chart Review: SAME DAY WORK-UP   Case: [id number] Date/Time: 01/26/19 0715   Procedure: right shoulder arthroscopy, rotator cuff repair, possible biceps tenodesis or tenotomy (Right )   Anesthesia type: General   Pre-op diagnosis: right shoulder rotator cuff tear, biceps tendinopathy   Location: [facility]   Surgeons: [name], MD      DISCUSSION: Patient is an 83 year old male scheduled for the above procedure.  History includes former smoker, endocarditis/murmur/severe MR (s/p DaVinci minimally invasive mitral valve repair with a trapezoidal resection of P2, chordal transfer to P1, and insertion of a #34 [place] annuloplasty band 11/02/01, [facility]), afib (on Eliquis),  HTN,  OSA (CPAP), DM2, GERD, hiatal hernia, hearing loss (hearing aids), CVA (small remote left cerebellar infarct 10/24/17 MRI), hypercholesterolemia, BPH.  Preoperative cardiology input outlined by Loma Boston, PA-C on 01/23/19: "Given past medical history and time since last visit, based on ACC/AHA guidelines, [name] would be at acceptable risk for the planned procedure without further cardiovascular testing.  OK to hold Eliquis 2 days pre op.  I relayed this information to the patient."  Reports last Eliquis 01/23/2019.  01/23/19 Preoperative COVID-19 test negative.  He has a same-day work-up so he will get preoperative labs and anesthesia team evaluation on the day of surgery.   VS: Ht 5\' 11"  (1.803 m)   Wt 106.6 kg   BMI 32.78 kg/m   PROVIDERS: Claretta Fraise, MD is PCP  - Minus Breeding, MD is cardiologist. Last evaluation 12/01/18.    LABS: For day of surgery   IMAGES: MRI right shoulder 01/14/19: IMPRESSION: 1. Rotator cuff tendinopathy with a 0.8 cm from front to back partial tear of the posterior supraspinatus extending into the anterior infraspinatus. The tear is near full-thickness with a thin fragment of bursal sided tendon intact. Retraction is  approximately 1 cm. No atrophy. 2. Tendinopathy of the intra-articular long head of biceps without tear. 3. Advanced acromioclavicular and mild glenohumeral osteoarthritis. 4. Small volume of subacromial/subdeltoid fluid compatible with bursitis.   EKG: 12/01/18: Afib at 66 bpm   CV: Carotid US 10/25/17: Final Interpretation: Right Carotid: Velocities in the right ICA are consistent with a 1-39% stenosis. Left Carotid: Velocities in the left ICA are consistent with a 1-39% stenosis. Vertebrals: Bilateral vertebral arteries demonstrate antegrade flow.   Echo 10/24/17: Study Conclusions - Left ventricle: The cavity size was normal. There was mild   concentric hypertrophy. Systolic function was normal. The   estimated ejection fraction was in the range of 60% to 65%. Wall   motion was normal; there were no regional wall motion   abnormalities. - Aortic valve: Transvalvular velocity was within the normal range.   There was no stenosis. There was no regurgitation. - Mitral valve: Transvalvular velocity was within the normal range.   There was no evidence for stenosis. There was mild regurgitation.   Valve area by continuity equation (using LVOT flow): 0.9 cm^2. - Left atrium: The atrium was severely dilated. - Right ventricle: The cavity size was normal. Wall thickness was   normal. Systolic function was normal. - Tricuspid valve: There was trivial regurgitation. - Pulmonary arteries: Systolic pressure was mildly increased. PA   peak pressure: 41 mm Hg (S).   24 Hour Holter monitor 05/10/16: Study Highlights Atrial fib.  Some ventricular ectopy.  Heart rates average 81 beats.  Significant brady rates happen during the sleeping hours.     Cardiac cath 02/14/07:  Coronaries:  Left  main was normal.  The LAD was normal.  First diagonal  was large and branching.  The circumflex was large and dominant.  There  was an OM-1, which was branching.  It was moderate size and normal.   There were two posterolaterals, which were small and normal.  The PDA  was small and normal.  The right coronary artery was nondominant.  There  was an anterior high takeoff.  This was reached with an Amplatz one  catheter.  There were mild luminal irregularities.  Left ventriculogram:  The left ventriculogram was obtained in the RAO  projection.  The EF 65% with normal wall motion. CONCLUSION:  Nonobstructive coronary artery disease.  Well-preserved  ejection fraction. PLAN:  The patient will continue to have medical management and  evaluation of nonanginal chest pain.   Past Medical History:  Diagnosis Date  . Arthritis    knees, shoulder,  hips (10/24/2017)  . Atrial fibrillation ([place])    on eliquis  . BPH (benign prostatic hyperplasia)   . Cataracts, bilateral    removed by surgery  . Coronary artery disease excluded   . Diabetes mellitus without complication ([place])    type 2  . Diverticulosis   . Endocarditis, valve unspecified, unspecified cause   . GERD (gastroesophageal reflux disease)   . H/O mitral valve repair    Postoperative ring with postoperative atrial fibrillation  . Hearing loss    both ears - does not use hearing aids  . Heart murmur    hx  . Hiatal hernia 06/13/2002  . High cholesterol   . History of kidney stones    passed spontaneously x2   . OSA on CPAP    uses cpap nightly  . Stroke ([place])   . Unspecified essential hypertension   . Weakness of both arms 07/19/2012  . Wears dentures    upper and lower partial    Past Surgical History:  Procedure Laterality Date  . CARDIAC CATHETERIZATION  02/14/2007   x 2  . CARDIOVERSION N/A 04/14/2016   Procedure: CARDIOVERSION;  Surgeon: Minus Breeding, MD;  [facility];  Service: Cardiovascular;  Laterality: N/A;  . CATARACT EXTRACTION W/PHACO Right 05/12/2015   Procedure: CATARACT EXTRACTION PHACO AND INTRAOCULAR LENS PLACEMENT RIGHT EYE CDE=7.75;  Surgeon: Tonny Branch, MD;  Location[facility];   Service: Ophthalmology;  Laterality: Right;  . CATARACT EXTRACTION W/PHACO Left 06/12/2015   Procedure: CATARACT EXTRACTION PHACO AND INTRAOCULAR LENS PLACEMENT (IOC);  Surgeon: Tonny Branch, MD;  [facility];  Service: Ophthalmology;  Laterality: Left;  CDE:  13.17  . COLONOSCOPY    . INJECTION KNEE Right 02/20/2016   Procedure: KNEE INJECTION;  Surgeon: [name], MD;  Location: [facility];  Service: Orthopedics;  Laterality: Right;  . JOINT REPLACEMENT    . KNEE ARTHROSCOPY Left   . LAPAROSCOPIC CHOLECYSTECTOMY    . MITRAL VALVE REPAIR  ~ 2003  . MULTIPLE TOOTH EXTRACTIONS    . SHOULDER OPEN ROTATOR CUFF REPAIR Left   . TOTAL KNEE ARTHROPLASTY Left 02/20/2016   Procedure: LEFT TOTAL KNEE ARTHROPLASTY WITH RIGHT KNEE INJECTION;  Surgeon: [name], MD;  Location: [facility];  Service: Orthopedics;  Laterality: Left;  . UPPER GI ENDOSCOPY    . VASECTOMY      MEDICATIONS: No current facility-administered medications for this encounter.   Marland Kitchen acetaminophen (TYLENOL) 500 MG tablet  . amLODipine (NORVASC) 5 MG tablet  . apixaban (ELIQUIS) 5 MG TABS tablet  . atorvastatin (LIPITOR) 10  MG tablet  . Calcium Carb-Cholecalciferol (CALCIUM 600 + D PO)  . Coenzyme Q10 300 MG CAPS  . diphenhydramine-acetaminophen (TYLENOL PM) 25-500 MG TABS tablet  . lisinopril (ZESTRIL) 20 MG tablet  . Menthol-Methyl Salicylate (MUSCLE RUB) 10-15 % CREA  . metFORMIN (GLUCOPHAGE-XR) 500 MG 24 hr tablet  . Multiple Vitamin (MULTIVITAMIN WITH MINERALS) TABS tablet  . omeprazole (PRILOSEC) 20 MG capsule  . HYDROcodone-acetaminophen (NORCO/VICODIN) 5-325 MG tablet    Myra Gianotti, PA-C Surgical Short Stay/Anesthesiology [facility] Phone [phone] [facility] Phone [phone] 01/25/2019 12:34 PM

## 2019-01-25 NOTE — Anesthesia Preprocedure Evaluation (Addendum)
Anesthesia Evaluation  Patient identified by MRN, date of birth, ID band Patient awake    Reviewed: Allergy & Precautions, NPO status , Patient's Chart, lab work & pertinent test results  History of Anesthesia Complications Negative for: history of anesthetic complications  Airway Mallampati: II  TM Distance: >3 FB Neck ROM: Full    Dental  (+) Poor Dentition, Missing,    Pulmonary sleep apnea and Continuous Positive Airway Pressure Ventilation , former smoker,    Pulmonary exam normal        Cardiovascular hypertension, Pt. on medications Normal cardiovascular exam  TTE 10/2017: EF 60-65%, mild LVH, mild MR, severe LAE, PASP 69mmHg   S/p mitral valve repair   Neuro/Psych CVA, No Residual Symptoms    GI/Hepatic Neg liver ROS, hiatal hernia, GERD  Medicated and Controlled,  Endo/Other  diabetes, Type 2, Oral Hypoglycemic Agents  Renal/GU negative Renal ROS     Musculoskeletal  (+) Arthritis ,   Abdominal   Peds  Hematology negative hematology ROS (+)   Anesthesia Other Findings   Reproductive/Obstetrics                            Anesthesia Physical Anesthesia Plan  ASA: II  Anesthesia Plan: General   Post-op Pain Management: GA combined w/ Regional for post-op pain   Induction: Intravenous  PONV Risk Score and Plan: 2 and Treatment may vary due to age or medical condition, Ondansetron and Dexamethasone  Airway Management Planned: Oral ETT  Additional Equipment: None  Intra-op Plan:   Post-operative Plan: Extubation in OR  Informed Consent: I have reviewed the patients History and Physical, chart, labs and discussed the procedure including the risks, benefits and alternatives for the proposed anesthesia with the patient or authorized representative who has indicated his/her understanding and acceptance.     Dental advisory given  Plan Discussed with: CRNA  Anesthesia  Plan Comments: (PAT note written 01/25/2019 by Myra Gianotti, PA-C. )      Anesthesia Quick Evaluation

## 2019-01-25 NOTE — H&P (Signed)
Patient: [name]  Date of Birth: 09/10/1935  MRN: [id number]  Visit Date: 01/18/2019  Requested by: Claretta Fraise, MD  [place], [place] [address]  PCP: Claretta Fraise, MD  Assessment & Plan:  Visit Diagnoses:  1. Traumatic complete tear of right rotator cuff, subsequent encounter   2. Biceps tendinopathy of right upper extremity    Plan: We reviewed his MRI scan with the patient. I gave him a copy of the report. He has had symptoms since a fall in October with progressive weakness pain pain that wakes him up at night. He cannot sleep on his right side. Pain with driving outstretch reaching. He has had anti-inflammatories, Tylenol, previous injection without relief. Patient states he like to proceed with operative intervention with shoulder arthroscopy outpatient rotator cuff repair. We discussed his past history he did need to have cardiology clearance with his cardiac problems. Surgery likely would be done with a suprascapular block and then possibly LMA tube placement in outpatient center. If he does well with the block he should be able to go home. Procedure discussed. We discussed treatment of the biceps tendon as well with either tenodesis versus tendon debridement and release. Patient understands request to proceed.  Follow-Up Instructions: No follow-ups on file.  Orders:  No orders of the defined types were placed in this encounter.   No orders of the defined types were placed in this encounter.   Procedures:  No procedures performed  Clinical Data:  No additional findings.  Subjective:      Chief Complaint  Patient presents with  . Right Shoulder - Pain, Follow-up    MRI Review   HPI 83 year old male returns with ongoing problems with right shoulder pain. He had acute onset when he had a fall October 7 and had difficulty lifting his arm since that time. He has pain with outstretched reaching and overhead lifting. He has had an injection in the past with only  temporary relief. He has taken Tylenol heat ice without improvement. MRI scan was obtained and is available for review. This shows 0.8 cm tear front to back involving the supraspinatus with 1 cm retraction of the near full-thickness tear. No muscle atrophy. He also has tendinopathy of the intra-articular portion of long head of the biceps.  Review of Systems patient had previous coronary artery disease, stent placement, brainstem infarct, dyslipidemia, hypertension, mitral valve replacement, previous total knee arthroplasty left doing well. Type 2 diabetes. Negative for current chest pain. Positive hypertension. GU negative.  Objective:  Vital Signs: Ht 5\' 11"  (1.803 m)  Wt 235 lb (106.6 kg)  BMI 32.78 kg/m  Physical Exam  Constitutional:  Appearance: He is well-developed.  HENT:  Head: Normocephalic and atraumatic.  Eyes:  Pupils: Pupils are equal, round, and reactive to light.  Neck:  Thyroid: No thyromegaly.  Trachea: No tracheal deviation.  Cardiovascular:  Rate and Rhythm: Normal rate.  Pulmonary:  Effort: Pulmonary effort is normal.  Breath sounds: No wheezing.  Abdominal:  General: Bowel sounds are normal.  Palpations: Abdomen is soft.  Skin:  General: Skin is warm and dry.  Capillary Refill: Capillary refill takes less than 2 seconds.  Neurological:  Mental Status: He is alert and oriented to person, place, and time.  Psychiatric:  Behavior: Behavior normal.  Thought Content: Thought content normal.  Judgment: Judgment normal.   Ortho Exam patient has positive impingement right shoulder. Pain with resisted supraspinatus testing. Good cervical range of motion no brachial plexus tenderness  negative Spurling. Long head of the biceps tendon is tender with palpation over the bicipital groove. Upper extremity reflexes are 2+ and symmetrical.  Specialty Comments:  No specialty comments available.  Imaging:CLINICAL DATA: Right shoulder and upper arm pain when raising the  arm  since 11/22/2018. No known injury.  EXAM:  MRI OF THE RIGHT SHOULDER WITHOUT CONTRAST  TECHNIQUE:  Multiplanar, multisequence MR imaging of the shoulder was performed.  No intravenous contrast was administered.  COMPARISON: None.  FINDINGS:  Rotator cuff: Thickening and heterogeneously increased T2 signal in  the rotator cuff tendons are severe in the supraspinatus. There is a  partial tear of the posterior supraspinatus extending into the  anterior infraspinatus measuring approximately 0.8 cm from front to  back. The tear is approximately 1 cm medial to the tendon insertion.  The tear is near full-thickness with a thin fragment of bursal sided  tendon intact. Retraction is 1 cm or less.  Muscles: No focal atrophy or lesion.  Biceps long head: Intact. Tendinopathy of the intra-articular  segment is noted.  Acromioclavicular Joint: Advanced osteoarthritis is seen. Type 2  acromion. A small volume of fluid is present in the  subacromial/subdeltoid bursa.  Glenohumeral Joint: Cartilage is thinned and there is a small  osteophyte off the humeral head.  Labrum: The superior labrum is markedly degenerated.  Bones: No fracture or focal lesion.  Other: None.  IMPRESSION:  1. Rotator cuff tendinopathy with a 0.8 cm from front to back  partial tear of the posterior supraspinatus extending into the  anterior infraspinatus. The tear is near full-thickness with a thin  fragment of bursal sided tendon intact. Retraction is approximately  1 cm. No atrophy.  2. Tendinopathy of the intra-articular long head of biceps without  tear.  3. Advanced acromioclavicular and mild glenohumeral osteoarthritis.  4. Small volume of subacromial/subdeltoid fluid compatible with  bursitis.  Electronically Signed  By: Inge Rise M.D.  On: 01/14/2019 09:59  PMFS History:       Patient Active Problem List   Diagnosis Date Noted  . Unilateral primary osteoarthritis, right knee 01/15/2016    Priority:  Medium  . Complete tear of right rotator cuff 01/19/2019  . Biceps tendinopathy of right upper extremity 01/19/2019  . Pain in right shoulder 12/06/2018  . TIA (transient ischemic attack) 12/05/2017  . Type 2 diabetes mellitus without complication ([place]) [id number]  . Brainstem infarction ([place]) 10/25/2017  . Diplopia 10/24/2017  . S/P total knee arthroplasty, left 04/29/2016  . Weakness of both arms 07/19/2012  . Colon cancer screening 06/22/2012  . Unspecified venous (peripheral) insufficiency 05/05/2012  . Chronic venous insufficiency 04/07/2012  . Other malaise and fatigue 03/16/2012  . Disturbance of skin sensation 03/16/2012  . Carotid stenosis 08/16/2011  . Obesity 08/16/2011  . Coronary artery disease excluded   . H/O mitral valve repair   . Endocarditis, valve unspecified, unspecified cause   . Dizziness 12/16/2010  . Dyslipidemia 09/23/2010  . Essential hypertension 04/06/2007  . Sleep apnea 04/06/2007  . DYSPNEA 04/06/2007       Past Medical History:  Diagnosis Date  . Arthritis    knees, shoulder, hips (10/24/2017)  . Atrial fibrillation ([place])    on Coumadin  . BPH (benign prostatic hyperplasia)   . Coronary artery disease excluded   . Diverticulosis   . Endocarditis, valve unspecified, unspecified cause   . GERD (gastroesophageal reflux disease)   . H/O mitral valve repair    Postoperative ring with postoperative atrial fibrillation  .  Heart murmur    hx  . Hiatal hernia 06/13/2002  . High cholesterol   . History of kidney stones    passed spontaneously x2   . OSA on CPAP   . Stroke ([place])   . Unspecified essential hypertension   . Weakness of both arms 07/19/2012        Family History  Problem Relation Age of Onset  . Congestive Heart Failure Father   . Heart disease Father   . Stroke Brother   . Stroke Sister   . Diabetes Mellitus II Brother   . Diabetes Mellitus II Sister   . Lung cancer Brother   . Colon cancer Neg Hx   . Esophageal cancer Neg  Hx   . Rectal cancer Neg Hx   . Stomach cancer Neg Hx         Past Surgical History:  Procedure Laterality Date  . CARDIAC CATHETERIZATION  X 2  . CARDIOVERSION N/A 04/14/2016   Procedure: CARDIOVERSION; Surgeon: Minus Breeding, MD; Location: [facility] ENDOSCOPY; Service: Cardiovascular; Laterality: N/A;  . CATARACT EXTRACTION W/PHACO Right 05/12/2015   Procedure: CATARACT EXTRACTION PHACO AND INTRAOCULAR LENS PLACEMENT RIGHT EYE CDE=7.75; Surgeon: Tonny Branch, MD; Location: [facility] ORS; Service: Ophthalmology; Laterality: Right;  . CATARACT EXTRACTION W/PHACO Left 06/12/2015   Procedure: CATARACT EXTRACTION PHACO AND INTRAOCULAR LENS PLACEMENT (IOC); Surgeon: Tonny Branch, MD; Location: [facility] ORS; Service: Ophthalmology; Laterality: Left; CDE: 13.17  . INJECTION KNEE Right 02/20/2016   Procedure: KNEE INJECTION; Surgeon: [name], MD; Location: [facility]; Service: Orthopedics; Laterality: Right;  . JOINT REPLACEMENT    . KNEE ARTHROSCOPY Left   . LAPAROSCOPIC CHOLECYSTECTOMY    . MITRAL VALVE REPAIR  ~ 2003  . SHOULDER OPEN ROTATOR CUFF REPAIR Left   . TOTAL KNEE ARTHROPLASTY Left 02/20/2016   Procedure: LEFT TOTAL KNEE ARTHROPLASTY WITH RIGHT KNEE INJECTION; Surgeon: [name], MD; Location: [facility]; Service: Orthopedics; Laterality: Left;  Marland Kitchen VASECTOMY     Social History        Occupational History  . Occupation: Environmental manager    Comment: RETIRED  Tobacco Use  . Smoking status: Former Smoker    Packs/day: 3.00    Years: 35.00    Pack years: 105.00    Types: Cigarettes    Quit date: 02/16/1988    Years since quitting: 30.9  . Smokeless tobacco: Never Used  . Tobacco comment: Year Quit: 1990   Substance and Sexual Activity  . Alcohol use: No  . Drug use: Never  . Sexual activity: Not Currently

## 2019-01-26 ENCOUNTER — Other Ambulatory Visit: Payer: Self-pay

## 2019-01-26 ENCOUNTER — Encounter (HOSPITAL_COMMUNITY): Payer: Self-pay | Admitting: Orthopaedic Surgery

## 2019-01-26 ENCOUNTER — Observation Stay (HOSPITAL_COMMUNITY)
Admission: RE | Admit: 2019-01-26 | Discharge: 2019-01-26 | Disposition: A | Discharge status: Home / Self Care | Payer: Medicare Other | Attending: Orthopaedic Surgery | Admitting: Orthopaedic Surgery

## 2019-01-26 ENCOUNTER — Ambulatory Visit (HOSPITAL_COMMUNITY): Payer: Medicare Other | Admitting: Vascular Surgery

## 2019-01-26 ENCOUNTER — Encounter (HOSPITAL_COMMUNITY)
Admission: RE | Disposition: A | Discharge status: Home / Self Care | Payer: Self-pay | Source: Home / Self Care | Attending: Orthopaedic Surgery

## 2019-01-26 DIAGNOSIS — M7521 Bicipital tendinitis, right shoulder: Secondary | ICD-10-CM

## 2019-01-26 DIAGNOSIS — E7800 Pure hypercholesterolemia, unspecified: Secondary | ICD-10-CM | POA: Diagnosis not present

## 2019-01-26 DIAGNOSIS — M7541 Impingement syndrome of right shoulder: Secondary | ICD-10-CM | POA: Insufficient documentation

## 2019-01-26 DIAGNOSIS — Z7901 Long term (current) use of anticoagulants: Secondary | ICD-10-CM | POA: Diagnosis not present

## 2019-01-26 DIAGNOSIS — M75111 Incomplete rotator cuff tear or rupture of right shoulder, not specified as traumatic: Secondary | ICD-10-CM

## 2019-01-26 DIAGNOSIS — G8918 Other acute postprocedural pain: Secondary | ICD-10-CM | POA: Diagnosis not present

## 2019-01-26 DIAGNOSIS — I872 Venous insufficiency (chronic) (peripheral): Secondary | ICD-10-CM | POA: Insufficient documentation

## 2019-01-26 DIAGNOSIS — M65811 Other synovitis and tenosynovitis, right shoulder: Secondary | ICD-10-CM | POA: Insufficient documentation

## 2019-01-26 DIAGNOSIS — S46211A Strain of muscle, fascia and tendon of other parts of biceps, right arm, initial encounter: Secondary | ICD-10-CM | POA: Diagnosis not present

## 2019-01-26 DIAGNOSIS — Z87891 Personal history of nicotine dependence: Secondary | ICD-10-CM | POA: Diagnosis not present

## 2019-01-26 DIAGNOSIS — G4733 Obstructive sleep apnea (adult) (pediatric): Secondary | ICD-10-CM | POA: Diagnosis not present

## 2019-01-26 DIAGNOSIS — E669 Obesity, unspecified: Secondary | ICD-10-CM | POA: Insufficient documentation

## 2019-01-26 DIAGNOSIS — Z952 Presence of prosthetic heart valve: Secondary | ICD-10-CM | POA: Insufficient documentation

## 2019-01-26 DIAGNOSIS — Z955 Presence of coronary angioplasty implant and graft: Secondary | ICD-10-CM | POA: Diagnosis not present

## 2019-01-26 DIAGNOSIS — M75121 Complete rotator cuff tear or rupture of right shoulder, not specified as traumatic: Secondary | ICD-10-CM | POA: Diagnosis not present

## 2019-01-26 DIAGNOSIS — Z8673 Personal history of transient ischemic attack (TIA), and cerebral infarction without residual deficits: Secondary | ICD-10-CM | POA: Insufficient documentation

## 2019-01-26 DIAGNOSIS — Z7984 Long term (current) use of oral hypoglycemic drugs: Secondary | ICD-10-CM | POA: Insufficient documentation

## 2019-01-26 DIAGNOSIS — Z79899 Other long term (current) drug therapy: Secondary | ICD-10-CM | POA: Diagnosis not present

## 2019-01-26 DIAGNOSIS — N400 Enlarged prostate without lower urinary tract symptoms: Secondary | ICD-10-CM | POA: Diagnosis not present

## 2019-01-26 DIAGNOSIS — E785 Hyperlipidemia, unspecified: Secondary | ICD-10-CM | POA: Diagnosis not present

## 2019-01-26 DIAGNOSIS — Z833 Family history of diabetes mellitus: Secondary | ICD-10-CM | POA: Diagnosis not present

## 2019-01-26 DIAGNOSIS — I4891 Unspecified atrial fibrillation: Secondary | ICD-10-CM | POA: Insufficient documentation

## 2019-01-26 DIAGNOSIS — Z801 Family history of malignant neoplasm of trachea, bronchus and lung: Secondary | ICD-10-CM | POA: Insufficient documentation

## 2019-01-26 DIAGNOSIS — K219 Gastro-esophageal reflux disease without esophagitis: Secondary | ICD-10-CM | POA: Insufficient documentation

## 2019-01-26 DIAGNOSIS — Z87442 Personal history of urinary calculi: Secondary | ICD-10-CM | POA: Insufficient documentation

## 2019-01-26 DIAGNOSIS — I10 Essential (primary) hypertension: Secondary | ICD-10-CM | POA: Insufficient documentation

## 2019-01-26 DIAGNOSIS — M1711 Unilateral primary osteoarthritis, right knee: Secondary | ICD-10-CM | POA: Diagnosis not present

## 2019-01-26 DIAGNOSIS — S46111A Strain of muscle, fascia and tendon of long head of biceps, right arm, initial encounter: Secondary | ICD-10-CM | POA: Insufficient documentation

## 2019-01-26 DIAGNOSIS — W19XXXA Unspecified fall, initial encounter: Secondary | ICD-10-CM | POA: Insufficient documentation

## 2019-01-26 DIAGNOSIS — Z6832 Body mass index (BMI) 32.0-32.9, adult: Secondary | ICD-10-CM | POA: Insufficient documentation

## 2019-01-26 DIAGNOSIS — E119 Type 2 diabetes mellitus without complications: Secondary | ICD-10-CM | POA: Diagnosis not present

## 2019-01-26 DIAGNOSIS — K449 Diaphragmatic hernia without obstruction or gangrene: Secondary | ICD-10-CM | POA: Insufficient documentation

## 2019-01-26 DIAGNOSIS — Z8249 Family history of ischemic heart disease and other diseases of the circulatory system: Secondary | ICD-10-CM | POA: Insufficient documentation

## 2019-01-26 DIAGNOSIS — S43431A Superior glenoid labrum lesion of right shoulder, initial encounter: Secondary | ICD-10-CM | POA: Diagnosis not present

## 2019-01-26 DIAGNOSIS — M1990 Unspecified osteoarthritis, unspecified site: Secondary | ICD-10-CM | POA: Insufficient documentation

## 2019-01-26 DIAGNOSIS — I2510 Atherosclerotic heart disease of native coronary artery without angina pectoris: Secondary | ICD-10-CM | POA: Insufficient documentation

## 2019-01-26 HISTORY — DX: Presence of dental prosthetic device (complete) (partial): Z97.2

## 2019-01-26 HISTORY — DX: Unspecified cataract: H26.9

## 2019-01-26 HISTORY — PX: SHOULDER ARTHROSCOPY WITH ROTATOR CUFF REPAIR AND OPEN BICEPS TENODESIS: SHX6677

## 2019-01-26 HISTORY — DX: Unspecified hearing loss, unspecified ear: H91.90

## 2019-01-26 HISTORY — DX: Type 2 diabetes mellitus without complications: E11.9

## 2019-01-26 LAB — BASIC METABOLIC PANEL
Anion gap: 12 (ref 5–15)
BUN: 16 mg/dL (ref 8–23)
CO2: 25 mmol/L (ref 22–32)
Calcium: 9.2 mg/dL (ref 8.9–10.3)
Chloride: 103 mmol/L (ref 98–111)
Creatinine, Ser: 0.79 mg/dL (ref 0.61–1.24)
GFR calc Af Amer: >60 mL/min (ref >60)
GFR calc non Af Amer: >60 mL/min (ref >60)
Glucose, Bld: 158 mg/dL — ABNORMAL HIGH (ref 70–99)
Potassium: 3.8 mmol/L (ref 3.5–5.1)
Sodium: 140 mmol/L (ref 135–145)

## 2019-01-26 LAB — CBC
HCT: 45.1 % (ref 39.0–52.0)
Hemoglobin: 15.3 g/dL (ref 13.0–17.0)
MCH: 32.8 pg (ref 26.0–34.0)
MCHC: 33.9 g/dL (ref 30.0–36.0)
MCV: 96.6 fL (ref 80.0–100.0)
Platelets: 229 10*3/uL (ref 150–400)
RBC: 4.67 MIL/uL (ref 4.22–5.81)
RDW: 13 % (ref 11.5–15.5)
WBC: 8.4 10*3/uL (ref 4.0–10.5)
nRBC: 0 % (ref 0.0–0.2)

## 2019-01-26 LAB — SURGICAL PCR SCREEN
MRSA, PCR: NEGATIVE
Staphylococcus aureus: NEGATIVE

## 2019-01-26 LAB — HEMOGLOBIN A1C
Hgb A1c MFr Bld: 6.8 % — ABNORMAL HIGH (ref 4.8–5.6)
Mean Plasma Glucose: 148.46 mg/dL

## 2019-01-26 LAB — GLUCOSE, CAPILLARY: Glucose-Capillary: 147 mg/dL — ABNORMAL HIGH (ref 70–99)

## 2019-01-26 SURGERY — SHOULDER ARTHROSCOPY WITH ROTATOR CUFF REPAIR AND OPEN BICEPS TENODESIS
Anesthesia: General | Laterality: Right

## 2019-01-26 MED ORDER — SUCCINYLCHOLINE CHLORIDE 200 MG/10ML IV SOSY
PREFILLED_SYRINGE | INTRAVENOUS | Status: AC
  Filled 2019-01-26: qty 10

## 2019-01-26 MED ORDER — DEXAMETHASONE SODIUM PHOSPHATE 10 MG/ML IJ SOLN
INTRAMUSCULAR | Status: DC | PRN
  Administered 2019-01-26: 10 mg via INTRAVENOUS

## 2019-01-26 MED ORDER — ONDANSETRON HCL 4 MG/2ML IJ SOLN
INTRAMUSCULAR | Status: AC
  Filled 2019-01-26: qty 2

## 2019-01-26 MED ORDER — BUPIVACAINE-EPINEPHRINE (PF) 0.5% -1:200000 IJ SOLN
INTRAMUSCULAR | Status: DC | PRN
  Administered 2019-01-26: 3 mL

## 2019-01-26 MED ORDER — ROCURONIUM BROMIDE 10 MG/ML (PF) SYRINGE
PREFILLED_SYRINGE | INTRAVENOUS | Status: AC
  Filled 2019-01-26: qty 10

## 2019-01-26 MED ORDER — ROCURONIUM BROMIDE 10 MG/ML (PF) SYRINGE
PREFILLED_SYRINGE | INTRAVENOUS | Status: AC
  Filled 2019-01-26: qty 20

## 2019-01-26 MED ORDER — DEXAMETHASONE SODIUM PHOSPHATE 10 MG/ML IJ SOLN
INTRAMUSCULAR | Status: AC
  Filled 2019-01-26: qty 1

## 2019-01-26 MED ORDER — ACETAMINOPHEN 500 MG PO TABS
1000.0000 mg | ORAL_TABLET | Freq: Once | ORAL | Status: AC
  Administered 2019-01-26: 1000 mg via ORAL
  Filled 2019-01-26: qty 2

## 2019-01-26 MED ORDER — ONDANSETRON HCL 4 MG/2ML IJ SOLN
INTRAMUSCULAR | Status: DC | PRN
  Administered 2019-01-26: 4 mg via INTRAVENOUS

## 2019-01-26 MED ORDER — PHENYLEPHRINE HCL-NACL 10-0.9 MG/250ML-% IV SOLN
INTRAVENOUS | Status: DC | PRN
  Administered 2019-01-26: 50 ug/min via INTRAVENOUS

## 2019-01-26 MED ORDER — PROPOFOL 10 MG/ML IV BOLUS
INTRAVENOUS | Status: AC
  Filled 2019-01-26: qty 20

## 2019-01-26 MED ORDER — BUPIVACAINE LIPOSOME 1.3 % IJ SUSP
INTRAMUSCULAR | Status: DC | PRN
  Administered 2019-01-26: 10 mg via PERINEURAL

## 2019-01-26 MED ORDER — CHLORHEXIDINE GLUCONATE 4 % EX LIQD: 60.0000 mL | Freq: Once | CUTANEOUS | Status: DC

## 2019-01-26 MED ORDER — GLYCOPYRROLATE 0.2 MG/ML IJ SOLN
INTRAMUSCULAR | Status: DC | PRN
  Administered 2019-01-26: .2 mg via INTRAVENOUS

## 2019-01-26 MED ORDER — PROPOFOL 10 MG/ML IV BOLUS
INTRAVENOUS | Status: DC | PRN
  Administered 2019-01-26: 200 mg via INTRAVENOUS

## 2019-01-26 MED ORDER — BUPIVACAINE-EPINEPHRINE 0.5% -1:200000 IJ SOLN
INTRAMUSCULAR | Status: AC
  Filled 2019-01-26: qty 1

## 2019-01-26 MED ORDER — LIDOCAINE 2% (20 MG/ML) 5 ML SYRINGE
INTRAMUSCULAR | Status: AC
  Filled 2019-01-26: qty 5

## 2019-01-26 MED ORDER — FENTANYL CITRATE (PF) 250 MCG/5ML IJ SOLN
INTRAMUSCULAR | Status: AC
  Filled 2019-01-26: qty 5

## 2019-01-26 MED ORDER — MIDAZOLAM HCL 2 MG/2ML IJ SOLN
INTRAMUSCULAR | Status: AC
  Filled 2019-01-26: qty 2

## 2019-01-26 MED ORDER — HYDROCODONE-ACETAMINOPHEN 10-325 MG PO TABS: 1.0000 | ORAL_TABLET | Freq: Four times a day (QID) | ORAL | 0 refills | Status: DC | PRN

## 2019-01-26 MED ORDER — PHENYLEPHRINE 40 MCG/ML (10ML) SYRINGE FOR IV PUSH (FOR BLOOD PRESSURE SUPPORT)
PREFILLED_SYRINGE | INTRAVENOUS | Status: DC | PRN
  Administered 2019-01-26: 160 ug via INTRAVENOUS
  Administered 2019-01-26: 120 ug via INTRAVENOUS

## 2019-01-26 MED ORDER — SODIUM CHLORIDE 0.9 % IR SOLN
Status: DC | PRN
  Administered 2019-01-26: 1000 mL

## 2019-01-26 MED ORDER — SUCCINYLCHOLINE CHLORIDE 20 MG/ML IJ SOLN
INTRAMUSCULAR | Status: DC | PRN
  Administered 2019-01-26: 140 mg via INTRAVENOUS

## 2019-01-26 MED ORDER — MIDAZOLAM HCL 5 MG/5ML IJ SOLN
INTRAMUSCULAR | Status: DC | PRN
  Administered 2019-01-26: 1 mg via INTRAVENOUS

## 2019-01-26 MED ORDER — METHOCARBAMOL 500 MG PO TABS: 500.0000 mg | ORAL_TABLET | Freq: Four times a day (QID) | ORAL | 0 refills | Status: DC | PRN

## 2019-01-26 MED ORDER — CEFAZOLIN SODIUM-DEXTROSE 2-4 GM/100ML-% IV SOLN
2.0000 g | INTRAVENOUS | Status: AC
  Administered 2019-01-26: 2 g via INTRAVENOUS
  Filled 2019-01-26: qty 100

## 2019-01-26 MED ORDER — LIDOCAINE 2% (20 MG/ML) 5 ML SYRINGE
INTRAMUSCULAR | Status: DC | PRN
  Administered 2019-01-26: 100 mg via INTRAVENOUS

## 2019-01-26 MED ORDER — BUPIVACAINE-EPINEPHRINE (PF) 0.5% -1:200000 IJ SOLN
INTRAMUSCULAR | Status: DC | PRN
  Administered 2019-01-26: 15 mL via PERINEURAL

## 2019-01-26 MED ORDER — LACTATED RINGERS IV SOLN
INTRAVENOUS | Status: DC | PRN
  Administered 2019-01-26 (×2): via INTRAVENOUS

## 2019-01-26 MED ORDER — EPHEDRINE SULFATE-NACL 50-0.9 MG/10ML-% IV SOSY
PREFILLED_SYRINGE | INTRAVENOUS | Status: DC | PRN
  Administered 2019-01-26: 10 mg via INTRAVENOUS

## 2019-01-26 MED ORDER — FENTANYL CITRATE (PF) 100 MCG/2ML IJ SOLN
INTRAMUSCULAR | Status: DC | PRN
  Administered 2019-01-26: 50 ug via INTRAVENOUS
  Administered 2019-01-26: 100 ug via INTRAVENOUS

## 2019-01-26 SURGICAL SUPPLY — 69 items
APL SKNCLS STERI-STRIP NONHPOA (GAUZE/BANDAGES/DRESSINGS) ×1
BENZOIN TINCTURE PRP APPL 2/3 (GAUZE/BANDAGES/DRESSINGS) ×3 IMPLANT
BLADE CUDA 4.2 (BLADE) ×1 IMPLANT
BLADE CUTTER GATOR 3.5 (BLADE) IMPLANT
BLADE SURG 11 STRL SS (BLADE) ×3 IMPLANT
BUR OVAL 6.0 (BURR) IMPLANT
CANNULA ACUFLEX KIT 5X76 (CANNULA) ×3 IMPLANT
CLOSURE STERI-STRIP 1/2X4 (GAUZE/BANDAGES/DRESSINGS) ×1
CLOSURE WOUND 1/2 X4 (GAUZE/BANDAGES/DRESSINGS) ×1
CLSR STERI-STRIP ANTIMIC 1/2X4 (GAUZE/BANDAGES/DRESSINGS) ×1 IMPLANT
COVER SURGICAL LIGHT HANDLE (MISCELLANEOUS) ×3 IMPLANT
COVER WAND RF STERILE (DRAPES) ×3 IMPLANT
DRAPE IMP U-DRAPE 54X76 (DRAPES) ×3 IMPLANT
DRAPE STERI 35X30 U-POUCH (DRAPES) ×3 IMPLANT
DRAPE U-SHAPE 47X51 STRL (DRAPES) ×6 IMPLANT
DRSG PAD ABDOMINAL 8X10 ST (GAUZE/BANDAGES/DRESSINGS) ×9 IMPLANT
DURAPREP 26ML APPLICATOR (WOUND CARE) ×3 IMPLANT
ELECT REM PT RETURN 9FT ADLT (ELECTROSURGICAL) ×3
ELECTRODE REM PT RTRN 9FT ADLT (ELECTROSURGICAL) ×1 IMPLANT
FILTER STRAW FLUID ASPIR (MISCELLANEOUS) IMPLANT
GAUZE SPONGE 4X4 12PLY STRL (GAUZE/BANDAGES/DRESSINGS) ×3 IMPLANT
GAUZE SPONGE 4X4 12PLY STRL LF (GAUZE/BANDAGES/DRESSINGS) ×2 IMPLANT
GLOVE BIOGEL PI IND STRL 8 (GLOVE) ×2 IMPLANT
GLOVE BIOGEL PI INDICATOR 8 (GLOVE) ×4
GLOVE ORTHO TXT STRL SZ7.5 (GLOVE) ×3 IMPLANT
GOWN STRL REUS W/ TWL LRG LVL3 (GOWN DISPOSABLE) ×2 IMPLANT
GOWN STRL REUS W/TWL 2XL LVL3 (GOWN DISPOSABLE) ×3 IMPLANT
GOWN STRL REUS W/TWL LRG LVL3 (GOWN DISPOSABLE) ×6
KIT BASIN OR (CUSTOM PROCEDURE TRAY) ×3 IMPLANT
KIT BIO-TENODESIS 3X8 DISP (MISCELLANEOUS) ×3
KIT INSRT BABSR STRL DISP BTN (MISCELLANEOUS) IMPLANT
KIT TURNOVER KIT B (KITS) ×3 IMPLANT
MANIFOLD NEPTUNE II (INSTRUMENTS) ×3 IMPLANT
NDL HYPO 25X1 1.5 SAFETY (NEEDLE) ×1 IMPLANT
NDL SPNL 18GX3.5 QUINCKE PK (NEEDLE) ×1 IMPLANT
NDL SUT 6 .5 CRC .975X.05 MAYO (NEEDLE) ×1 IMPLANT
NEEDLE HYPO 25X1 1.5 SAFETY (NEEDLE) ×3 IMPLANT
NEEDLE MAYO TAPER (NEEDLE) ×3
NEEDLE SPNL 18GX3.5 QUINCKE PK (NEEDLE) ×3 IMPLANT
NS IRRIG 1000ML POUR BTL (IV SOLUTION) ×3 IMPLANT
PACK SHOULDER (CUSTOM PROCEDURE TRAY) ×3 IMPLANT
PAD ABD 8X10 STRL (GAUZE/BANDAGES/DRESSINGS) ×2 IMPLANT
PAD ARMBOARD 7.5X6 YLW CONV (MISCELLANEOUS) ×6 IMPLANT
RESECTOR FULL RADIUS 4.2MM (BLADE) ×2 IMPLANT
SCREW PEEK TENODESIS 8X23M (Screw) ×2 IMPLANT
SCREW PEEK TENODESIS 9X23M (Screw) ×2 IMPLANT
SLING ARM FOAM STRAP LRG (SOFTGOODS) ×2 IMPLANT
SLING ARM IMMOBILIZER MED (SOFTGOODS) IMPLANT
SPONGE LAP 4X18 RFD (DISPOSABLE) IMPLANT
STRIP CLOSURE SKIN 1/2X4 (GAUZE/BANDAGES/DRESSINGS) ×2 IMPLANT
SUCTION FRAZIER HANDLE 10FR (MISCELLANEOUS) ×2
SUCTION TUBE FRAZIER 10FR DISP (MISCELLANEOUS) ×1 IMPLANT
SUT ETHILON 3 0 PS 1 (SUTURE) ×3 IMPLANT
SUT VIC AB 0 CT2 27 (SUTURE) IMPLANT
SUT VIC AB 2-0 CT1 27 (SUTURE) ×3
SUT VIC AB 2-0 CT1 TAPERPNT 27 (SUTURE) IMPLANT
SUT VIC AB 3-0 PS2 18 (SUTURE) ×2 IMPLANT
SUT VIC AB 3-0 SH 27 (SUTURE) ×3
SUT VIC AB 3-0 SH 27XBRD (SUTURE) IMPLANT
SUT VICRYL 4-0 PS2 18IN ABS (SUTURE) IMPLANT
SYR 20ML LL LF (SYRINGE) ×4 IMPLANT
SYR 3ML LL SCALE MARK (SYRINGE) IMPLANT
SYR TB 1ML LUER SLIP (SYRINGE) IMPLANT
TAPE CLOTH SURG 4X10 WHT LF (GAUZE/BANDAGES/DRESSINGS) ×2 IMPLANT
TOWEL GREEN STERILE (TOWEL DISPOSABLE) ×3 IMPLANT
TOWEL GREEN STERILE FF (TOWEL DISPOSABLE) ×3 IMPLANT
TUBING ARTHROSCOPY IRRIG 16FT (MISCELLANEOUS) ×3 IMPLANT
WAND STAR VAC 90 (SURGICAL WAND) ×2 IMPLANT
WATER STERILE IRR 1000ML POUR (IV SOLUTION) ×3 IMPLANT

## 2019-01-26 NOTE — Discharge Instructions (Signed)
Remove dressing 48 hours postop and apply bandaids to incisions.    Shoulder immobilizer must be on at all times.  Do not move arm.    Ice off an on as needed.    No lifting, pushing, pulling with right arm.   No driving

## 2019-01-26 NOTE — Discharge Summary (Signed)
Patient had right shoulder surgery for debridement and biceps tenodesis. Never admitted to the hospital.

## 2019-01-26 NOTE — Anesthesia Procedure Notes (Signed)
Procedure Name: Intubation Date/Time: 01/26/2019 7:42 AM Performed by: Lance Coon, CRNA Pre-anesthesia Checklist: Patient identified, Emergency Drugs available, Suction available, Patient being monitored and Timeout performed Patient Re-evaluated:Patient Re-evaluated prior to induction Oxygen Delivery Method: Circle system utilized Preoxygenation: Pre-oxygenation with 100% oxygen Induction Type: IV induction Ventilation: Mask ventilation with difficulty Laryngoscope Size: Miller and 3 Grade View: Grade I Tube type: Oral Tube size: 7.5 mm Number of attempts: 1 Airway Equipment and Method: Stylet Placement Confirmation: ETT inserted through vocal cords under direct vision,  positive ETCO2 and breath sounds checked- equal and bilateral Secured at: 22 cm Tube secured with: Tape Dental Injury: Teeth and Oropharynx as per pre-operative assessment

## 2019-01-26 NOTE — Anesthesia Postprocedure Evaluation (Signed)
Anesthesia Post Note  Patient: [name]  Procedure(s) Performed: right shoulder arthroscopy, rotator cuff repair and biceps tenodesis (Right )     Patient location during evaluation: PACU Anesthesia Type: General Level of consciousness: awake and alert and oriented Pain management: pain level controlled Vital Signs Assessment: post-procedure vital signs reviewed and stable Respiratory status: spontaneous breathing, nonlabored ventilation and respiratory function stable Cardiovascular status: blood pressure returned to baseline Postop Assessment: no apparent nausea or vomiting Anesthetic complications: no    Last Vitals:  Vitals:   01/26/19 1014 01/26/19 1015  BP:  133/73  Pulse:    Resp:    Temp:    SpO2: 93% 96%    Last Pain:  Vitals:   01/26/19 1015  TempSrc:   PainSc: 0-No pain                 Brennan Bailey

## 2019-01-26 NOTE — Anesthesia Procedure Notes (Signed)
Anesthesia Regional Block: Interscalene brachial plexus block   Pre-Anesthetic Checklist: ,, timeout performed, Correct Patient, Correct Site, Correct Laterality, Correct Procedure, Correct Position, site marked, Risks and benefits discussed, pre-op evaluation,  At surgeon's request and post-op pain management  Laterality: Right  Prep: Maximum Sterile Barrier Precautions used, chloraprep       Needles:  Injection technique: Single-shot  Needle Type: Echogenic Stimulator Needle     Needle Length: 4cm  Needle Gauge: 22     Additional Needles:   Procedures:,,,, ultrasound used (permanent image in chart),,,,  Narrative:  Start time: 01/26/2019 7:18 AM End time: 01/26/2019 7:20 AM Injection made incrementally with aspirations every 5 mL.  Performed by: Personally  Anesthesiologist: Brennan Bailey, MD  Additional Notes: Risks, benefits, and alternative discussed. Patient gave consent for procedure. Patient prepped and draped in sterile fashion. Sedation administered, patient remains easily responsive to voice. Relevant anatomy identified with ultrasound guidance. Local anesthetic given in 5cc increments with no signs or symptoms of intravascular injection. No pain or paraesthesias with injection. Patient monitored throughout procedure with signs of LAST or immediate complications. Tolerated well. Ultrasound image placed in chart.  Tawny Asal, MD

## 2019-01-26 NOTE — Op Note (Signed)
Preop diagnosis: Chronic right shoulder rotator cuff tear and partial intra-articular biceps tendon tear.  Postop diagnosis: Same  Procedure: Right shoulder arthroscopy with extensive debridement of superior labrum, biceps debridement, humeral head chondroplasty, undersurface rotator cuff debridement and open biceps tenodesis.    Surgeon: Rodell Perna, MD  Anesthesia preoperative block plus general.  EBL minimal.  Implants 9 x 23 mm Bio-Tenodesis screw for biceps tendon.  Procedure: After induction of preoperative block general anesthesia beachchair positioning, Q000111Q drape application prepping with DuraPrep preoperative Ancef prophylaxis usual impervious stockinette Covan arthroscopic sheets drapes pouch were applied timeout procedure completed.  Arthroscope was introduced from a posterior portal anterior portal was made inferior the biceps tendon.  There was extensive tearing rotator cuff with retraction corresponding with the MRI scan.  Biceps tendon showed partial tearing of the intra-articular portion with erythema at the attachment of the superior labrum with some superior labral tearing.  Chronic synovitis was present and there is also area on the humeral head that had some exposed subchondral bone with adjacent grade 3 changes.  4.2 Cuda shaver was introduced from the anterior portal this was trimmed and smoothed.  Superior labrum was trimmed back.  Biceps tendon was released at the attachment labrum with small and large straight flat baskets.  Undersurface the rotator cuff was debrided with the shaver.  Anterior-inferior labrum was intact.  Shoulder was suctioned dry arthroscope instruments and the anterior blue cannula was removed and 4-0 nylon was placed in the portals.  Small incision was made directly over the biceps tendon palpable and deltoid was split.  Right angle clamp used to deliver the biceps tendon resected 22 mm.  #2 FiberWire placed in a Bennell weave through the tendon and  initially an 8 x 23 corkscrew was used it did not totally seat and attempt to pull it out for receding the screw and handle disc was not securely connected and it fell and hit the floor.  We waited for additional 9 x 23 corkscrew to be delivered which was not in-house.  Once this was obtained the area where the bicipital groove had been drilled first with a K wire followed by over reaming with a 7 was used and the tendon was pushed in the hole secured with a 9 x 23 screw was secured elbow reach full extension.  Tendon went in the hole was secured.  2 ends of the suture were tied together and 2-0 Vicryl was used subcutaneous tissue reapproximation after irrigation.  3-0 Vicryl subcuticular closure tincture benzoin Steri-Strips 4 x 4's ABDs and tape with shoulder sling.  Patient will be discharged home must follow-up 1 week.

## 2019-01-26 NOTE — Interval H&P Note (Signed)
History and Physical Interval Note:  01/26/2019 7:25 AM  [name]  has presented today for surgery, with the diagnosis of right shoulder rotator cuff tear, biceps tendinopathy.  The various methods of treatment have been discussed with the patient and family. After consideration of risks, benefits and other options for treatment, the patient has consented to  Procedure(s): right shoulder arthroscopy, rotator cuff repair, possible biceps tenodesis or tenotomy (Right) as a surgical intervention.  The patient's history has been reviewed, patient examined, no change in status, stable for surgery.  I have reviewed the patient's chart and labs.  Questions were answered to the patient's satisfaction.     [name]

## 2019-01-26 NOTE — Transfer of Care (Signed)
Immediate Anesthesia Transfer of Care Note  Patient: [name]  Procedure(s) Performed: right shoulder arthroscopy, rotator cuff repair and biceps tenodesis (Right )  Patient Location: PACU  Anesthesia Type:GA combined with regional for post-op pain  Level of Consciousness: awake and patient cooperative  Airway & Oxygen Therapy: Patient Spontanous Breathing and Patient connected to face mask oxygen  Post-op Assessment: Report given to RN and Post -op Vital signs reviewed and stable  Post vital signs: Reviewed and stable  Last Vitals:  Vitals Value Taken Time  BP    Temp    Pulse    Resp    SpO2      Last Pain:  Vitals:   01/26/19 0657  TempSrc:   PainSc: 1       Patients Stated Pain Goal: 2 ([id number])  Complications: No apparent anesthesia complications

## 2019-01-30 ENCOUNTER — Encounter: Payer: Self-pay | Admitting: Family Medicine

## 2019-01-30 ENCOUNTER — Ambulatory Visit (INDEPENDENT_AMBULATORY_CARE_PROVIDER_SITE_OTHER): Payer: Medicare Other | Admitting: Family Medicine

## 2019-01-30 ENCOUNTER — Other Ambulatory Visit: Payer: Self-pay

## 2019-01-30 VITALS — BP 136/78 | HR 84 | Temp 95.9°F | Ht 71.0 in | Wt 242.4 lb

## 2019-01-30 DIAGNOSIS — M545 Low back pain, unspecified: Secondary | ICD-10-CM

## 2019-01-30 DIAGNOSIS — M546 Pain in thoracic spine: Secondary | ICD-10-CM

## 2019-01-30 LAB — URINALYSIS, COMPLETE
Bilirubin, UA: NEGATIVE
Glucose, UA: NEGATIVE
Ketones, UA: NEGATIVE
Leukocytes,UA: NEGATIVE
Nitrite, UA: NEGATIVE
Protein,UA: NEGATIVE
RBC, UA: NEGATIVE
Specific Gravity, UA: 1.02 (ref 1.005–1.030)
Urobilinogen, Ur: 1 mg/dL (ref 0.2–1.0)
pH, UA: 6.5 (ref 5.0–7.5)

## 2019-01-30 LAB — MICROSCOPIC EXAMINATION
Bacteria, UA: NONE SEEN
Epithelial Cells (non renal): NONE SEEN /hpf (ref 0–10)
RBC, Urine: NONE SEEN /hpf (ref 0–2)
Renal Epithel, UA: NONE SEEN /hpf
WBC, UA: NONE SEEN /hpf (ref 0–5)

## 2019-01-30 NOTE — Progress Notes (Signed)
Assessment & Plan:  1. Acute left-sided thoracic back pain - Encouraged use of Lidoderm patches, muscle rubs, heating pad, and Tylenol. Exercises provided for back pain.  Discussed that he is likely compensating due to his recent shoulder surgery.  2. Acute left-sided low back pain, unspecified whether sciatica present - urinalysis- dip and micro (negative)   Follow up plan: Return if symptoms worsen or fail to improve.  [name] Limes, MSN, APRN, FNP-C [facility]  Subjective:   Patient ID: [name], male    DOB: [birth year], 83 y.o.   MRN: [id number]  HPI: [name] is a 83 y.o. male presenting on 01/30/2019 for Back Pain (left lower x 2 days)  Patient reports left-sided back pain that started two days ago.  He had right shoulder surgery on Friday (4 days ago).  Reports his pain in his back is worse when he tries to walk or straighten up.  No recent falls or injuries.  He has taken Tylenol for pain.  He also has Norco that he may take as needed.   ROS: Negative unless specifically indicated above in HPI.   Relevant past medical history reviewed and updated as indicated.   Allergies and medications reviewed and updated.   Current Outpatient Medications:  .  amLODipine (NORVASC) 5 MG tablet, Take 1 tablet (5 mg total) by mouth daily., Disp: 30 tablet, Rfl: 3 .  apixaban (ELIQUIS) 5 MG TABS tablet, Take 1 tablet (5 mg total) by mouth 2 (two) times daily., Disp: 180 tablet, Rfl: 1 .  atorvastatin (LIPITOR) 10 MG tablet, Take 10 mg by mouth daily in the afternoon., Disp: , Rfl:  .  Calcium Carb-Cholecalciferol (CALCIUM 600 + D PO), Take 1 tablet by mouth daily., Disp: , Rfl:  .  Coenzyme Q10 300 MG CAPS, Take 300 mg by mouth daily., Disp: , Rfl:  .  HYDROcodone-acetaminophen (NORCO) 10-325 MG tablet, Take 1 tablet by mouth every 6 (six) hours as needed., Disp: 50 tablet, Rfl: 0 .  lisinopril (ZESTRIL) 20 MG tablet, Take 1 tablet (20 mg total) by  mouth daily. (Patient taking differently: Take 20 mg by mouth at bedtime. ), Disp: 90 tablet, Rfl: 1 .  metFORMIN (GLUCOPHAGE-XR) 500 MG 24 hr tablet, TAKE 1 TABLET BY MOUTH EVERY DAY WITH BREAKFAST (Patient taking differently: Take 500 mg by mouth daily with breakfast. ), Disp: 90 tablet, Rfl: 0 .  methocarbamol (ROBAXIN) 500 MG tablet, Take 1 tablet (500 mg total) by mouth every 6 (six) hours as needed for muscle spasms., Disp: 60 tablet, Rfl: 0 .  Multiple Vitamin (MULTIVITAMIN WITH MINERALS) TABS tablet, Take 1 tablet by mouth daily., Disp: , Rfl:  .  omeprazole (PRILOSEC) 20 MG capsule, Take 20 mg by mouth daily. , Disp: , Rfl:   Allergies  Allergen Reactions  . Flomax [Tamsulosin Hcl] Other (See Comments)    Makes him "swimmy headed"    Objective:   BP 136/78   Pulse 84   Temp (!) 95.9 F (35.5 C) (Temporal)   Ht 5\' 11"  (1.803 m)   Wt 242 lb 6.4 oz (110 kg)   SpO2 94%   BMI 33.81 kg/m    Physical Exam Vitals reviewed.  Constitutional:      General: He is not in acute distress.    Appearance: Normal appearance. He is obese. He is not ill-appearing, toxic-appearing or diaphoretic.  HENT:     Head: Normocephalic and atraumatic.  Eyes:     General:  No scleral icterus.       Right eye: No discharge.        Left eye: No discharge.     Conjunctiva/sclera: Conjunctivae normal.  Cardiovascular:     Rate and Rhythm: Normal rate.  Pulmonary:     Effort: Pulmonary effort is normal. No respiratory distress.  Musculoskeletal:        General: Normal range of motion.     Cervical back: Normal range of motion.     Thoracic back: Tenderness (muscles) present.  Skin:    General: Skin is warm and dry.  Neurological:     Mental Status: He is alert and oriented to person, place, and time. Mental status is at baseline.  Psychiatric:        Mood and Affect: Mood normal.        Behavior: Behavior normal.        Thought Content: Thought content normal.        Judgment: Judgment  normal.

## 2019-01-30 NOTE — Patient Instructions (Signed)
Lidoderm patches Muscle rubs Heat Tylenol - maximum is 3,000 mg in 24 hours.    Back Exercises These exercises help to make your trunk and back strong. They also help to keep the lower back flexible. Doing these exercises can help to prevent back pain or lessen existing pain.  If you have back pain, try to do these exercises 2-3 times each day or as told by your doctor.  As you get better, do the exercises once each day. Repeat the exercises more often as told by your doctor.  To stop back pain from coming back, do the exercises once each day, or as told by your doctor. Exercises Single knee to chest Do these steps 3-5 times in a row for each leg: 1. Lie on your back on a firm bed or the floor with your legs stretched out. 2. Bring one knee to your chest. 3. Grab your knee or thigh with both hands and hold them it in place. 4. Pull on your knee until you feel a gentle stretch in your lower back or buttocks. 5. Keep doing the stretch for 10-30 seconds. 6. Slowly let go of your leg and straighten it. Pelvic tilt Do these steps 5-10 times in a row: 1. Lie on your back on a firm bed or the floor with your legs stretched out. 2. Bend your knees so they point up to the ceiling. Your feet should be flat on the floor. 3. Tighten your lower belly (abdomen) muscles to press your lower back against the floor. This will make your tailbone point up to the ceiling instead of pointing down to your feet or the floor. 4. Stay in this position for 5-10 seconds while you gently tighten your muscles and breathe evenly. Cat-cow Do these steps until your lower back bends more easily: 1. Get on your hands and knees on a firm surface. Keep your hands under your shoulders, and keep your knees under your hips. You may put padding under your knees. 2. Let your head hang down toward your chest. Tighten (contract) the muscles in your belly. Point your tailbone toward the floor so your lower back becomes rounded  like the back of a cat. 3. Stay in this position for 5 seconds. 4. Slowly lift your head. Let the muscles of your belly relax. Point your tailbone up toward the ceiling so your back forms a sagging arch like the back of a cow. 5. Stay in this position for 5 seconds.  Press-ups Do these steps 5-10 times in a row: 1. Lie on your belly (face-down) on the floor. 2. Place your hands near your head, about shoulder-width apart. 3. While you keep your back relaxed and keep your hips on the floor, slowly straighten your arms to raise the top half of your body and lift your shoulders. Do not use your back muscles. You may change where you place your hands in order to make yourself more comfortable. 4. Stay in this position for 5 seconds. 5. Slowly return to lying flat on the floor.  Bridges Do these steps 10 times in a row: 1. Lie on your back on a firm surface. 2. Bend your knees so they point up to the ceiling. Your feet should be flat on the floor. Your arms should be flat at your sides, next to your body. 3. Tighten your butt muscles and lift your butt off the floor until your waist is almost as high as your knees. If you do not feel  the muscles working in your butt and the back of your thighs, slide your feet 1-2 inches farther away from your butt. 4. Stay in this position for 3-5 seconds. 5. Slowly lower your butt to the floor, and let your butt muscles relax. If this exercise is too easy, try doing it with your arms crossed over your chest. Belly crunches Do these steps 5-10 times in a row: 1. Lie on your back on a firm bed or the floor with your legs stretched out. 2. Bend your knees so they point up to the ceiling. Your feet should be flat on the floor. 3. Cross your arms over your chest. 4. Tip your chin a little bit toward your chest but do not bend your neck. 5. Tighten your belly muscles and slowly raise your chest just enough to lift your shoulder blades a tiny bit off of the floor.  Avoid raising your body higher than that, because it can put too much stress on your low back. 6. Slowly lower your chest and your head to the floor. Back lifts Do these steps 5-10 times in a row: 1. Lie on your belly (face-down) with your arms at your sides, and rest your forehead on the floor. 2. Tighten the muscles in your legs and your butt. 3. Slowly lift your chest off of the floor while you keep your hips on the floor. Keep the back of your head in line with the curve in your back. Look at the floor while you do this. 4. Stay in this position for 3-5 seconds. 5. Slowly lower your chest and your face to the floor. Contact a doctor if:  Your back pain gets a lot worse when you do an exercise.  Your back pain does not get better 2 hours after you exercise. If you have any of these problems, stop doing the exercises. Do not do them again unless your doctor says it is okay. Get help right away if:  You have sudden, very bad back pain. If this happens, stop doing the exercises. Do not do them again unless your doctor says it is okay. This information is not intended to replace advice given to you by your health care provider. Make sure you discuss any questions you have with your health care provider. Document Released: 03/06/2010 Document Revised: 10/27/2017 Document Reviewed: 10/27/2017 Elsevier Patient Education  2020 [employer].

## 2019-02-01 ENCOUNTER — Ambulatory Visit (INDEPENDENT_AMBULATORY_CARE_PROVIDER_SITE_OTHER): Payer: Medicare Other | Admitting: Orthopaedic Surgery

## 2019-02-01 ENCOUNTER — Other Ambulatory Visit: Payer: Self-pay

## 2019-02-01 ENCOUNTER — Encounter: Payer: Self-pay | Admitting: Orthopaedic Surgery

## 2019-02-01 VITALS — Ht 71.0 in | Wt 238.0 lb

## 2019-02-01 DIAGNOSIS — M67921 Unspecified disorder of synovium and tendon, right upper arm: Secondary | ICD-10-CM

## 2019-02-01 NOTE — Progress Notes (Signed)
Post-Op Visit Note   Patient: [name]           Date of Birth: 09-15-1935           MRN: [id number] Visit Date: 02/01/2019 PCP: Claretta Fraise, MD   Assessment & Plan:  Chief Complaint:  Chief Complaint  Patient presents with  . Right Shoulder - Routine Post Op    01/26/2019 Right Shoulder Arthroscopy, RCR   Visit Diagnoses: Post extensive debridement in the shoulder with labral tear biceps tendon partial tear shoulder chondroplasty of the humeral head.  Undersurface rotator cuff debridement but he did not have a full-thickness rotator cuff tear.  Biceps tendon tenodesis.  Sutures harvested today.  Recheck 4 weeks.  Plan: Return 4 weeks.  We discussed intraoperative findings with partial tearing of the biceps tendon intra-articularly.  Follow-Up Instructions: No follow-ups on file.   Orders:  No orders of the defined types were placed in this encounter.  No orders of the defined types were placed in this encounter.   Imaging: No results found.  PMFS History: Patient Active Problem List   Diagnosis Date Noted  . Unilateral primary osteoarthritis, right knee 01/15/2016    Priority: Medium  . Rotator cuff impingement syndrome of right shoulder 01/26/2019  . Complete tear of right rotator cuff 01/19/2019  . Biceps tendinopathy of right upper extremity 01/19/2019  . Pain in right shoulder 12/06/2018  . TIA (transient ischemic attack) 12/05/2017  . Type 2 diabetes mellitus without complication ([place]) [id number]  . 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 Diplopia 10/24/2017  . S/P total knee arthroplasty, left 04/29/2016  . Weakness of both arms 07/19/2012  . Colon cancer screening 06/22/2012  . Unspecified venous (peripheral) insufficiency 05/05/2012  . Chronic venous insufficiency 04/07/2012  . Other malaise and fatigue 03/16/2012  . Disturbance of skin sensation 03/16/2012  . Carotid stenosis 08/16/2011  . Obesity 08/16/2011  . Coronary artery disease  excluded   . H/O mitral valve repair   . Endocarditis, valve unspecified, unspecified cause   . Dizziness 12/16/2010  . Dyslipidemia 09/23/2010  . Essential hypertension 04/06/2007  . Sleep apnea 04/06/2007  . DYSPNEA 04/06/2007   Past Medical History:  Diagnosis Date  . Arthritis    knees, shoulder,  hips (10/24/2017)  . Atrial fibrillation ([place])    on eliquis  . BPH (benign prostatic hyperplasia)   . Cataracts, bilateral    removed by surgery  . Coronary artery disease excluded   . Diabetes mellitus without complication ([place])    type 2  . Diverticulosis   . Endocarditis, valve unspecified, unspecified cause   . GERD (gastroesophageal reflux disease)   . H/O mitral valve repair    Postoperative ring with postoperative atrial fibrillation  . Hearing loss    both ears - does not use hearing aids  . Heart murmur    hx  . Hiatal hernia 06/13/2002  . High cholesterol   . History of kidney stones    passed spontaneously x2   . OSA on CPAP    uses cpap nightly  . Stroke ([place])   . Unspecified essential hypertension   . Weakness of both arms 07/19/2012  . Wears dentures    upper and lower partial    Family History  Problem Relation Age of Onset  . Congestive Heart Failure Father   . Heart disease Father   . Stroke Brother   . Stroke Sister   . Diabetes Mellitus II Brother   . Diabetes  Mellitus II Sister   . Lung cancer Brother   . Colon cancer Neg Hx   . Esophageal cancer Neg Hx   . Rectal cancer Neg Hx   . Stomach cancer Neg Hx     Past Surgical History:  Procedure Laterality Date  . CARDIAC CATHETERIZATION  02/14/2007   x 2  . CARDIOVERSION N/A 04/14/2016   Procedure: CARDIOVERSION;  Surgeon: Minus Breeding, MD;  Location: [facility] ENDOSCOPY;  Service: Cardiovascular;  Laterality: N/A;  . CATARACT EXTRACTION W/PHACO Right 05/12/2015   Procedure: CATARACT EXTRACTION PHACO AND INTRAOCULAR LENS PLACEMENT RIGHT EYE CDE=7.75;  Surgeon: Tonny Branch, MD;  Location: [facility] ORS;   Service: Ophthalmology;  Laterality: Right;  . CATARACT EXTRACTION W/PHACO Left 06/12/2015   Procedure: CATARACT EXTRACTION PHACO AND INTRAOCULAR LENS PLACEMENT (IOC);  Surgeon: Tonny Branch, MD;  Location: [facility] ORS;  Service: Ophthalmology;  Laterality: Left;  CDE:  13.17  . COLONOSCOPY    . INJECTION KNEE Right 02/20/2016   Procedure: KNEE INJECTION;  Surgeon: [name], MD;  Location: [facility];  Service: Orthopedics;  Laterality: Right;  . JOINT REPLACEMENT    . KNEE ARTHROSCOPY Left   . LAPAROSCOPIC CHOLECYSTECTOMY    . MITRAL VALVE REPAIR  ~ 2003  . MULTIPLE TOOTH EXTRACTIONS    . SHOULDER ARTHROSCOPY WITH ROTATOR CUFF REPAIR AND OPEN BICEPS TENODESIS Right 01/26/2019   Procedure: right shoulder arthroscopy, rotator cuff repair and biceps tenodesis;  Surgeon: [name], MD;  Location: [facility];  Service: Orthopedics;  Laterality: Right;  . SHOULDER OPEN ROTATOR CUFF REPAIR Left   . TOTAL KNEE ARTHROPLASTY Left 02/20/2016   Procedure: LEFT TOTAL KNEE ARTHROPLASTY WITH RIGHT KNEE INJECTION;  Surgeon: [name], MD;  Location: [facility];  Service: Orthopedics;  Laterality: Left;  . UPPER GI ENDOSCOPY    . VASECTOMY     Social History   Occupational History  . Occupation:  Environmental manager    Comment: RETIRED  Tobacco Use  . Smoking status: Former Smoker    Packs/day: 3.00    Years: 35.00    Pack years: 105.00    Types: Cigarettes    Quit date: 02/16/1988    Years since quitting: 30.9  . Smokeless tobacco: Never Used  . Tobacco comment:  Year Quit: 1990   Substance and Sexual Activity  . Alcohol use: No  . Drug use: Never  . Sexual activity: Not Currently

## 2019-02-05 ENCOUNTER — Ambulatory Visit (INDEPENDENT_AMBULATORY_CARE_PROVIDER_SITE_OTHER): Payer: Medicare Other | Admitting: *Deleted

## 2019-02-05 VITALS — BP 140/85 | Ht 71.0 in | Wt 235.0 lb

## 2019-02-05 DIAGNOSIS — Z0000 Encounter for general adult medical examination without abnormal findings: Secondary | ICD-10-CM | POA: Diagnosis not present

## 2019-02-05 NOTE — Progress Notes (Signed)
MEDICARE ANNUAL WELLNESS VISIT  02/05/2019  Telephone Visit Disclaimer This Medicare AWV was conducted by telephone due to national recommendations for restrictions regarding the COVID-19 Pandemic (e.g. social distancing).  I verified, using two identifiers, that I am speaking with [name] or their authorized healthcare agent. I discussed the limitations, risks, security, and privacy concerns of performing an evaluation and management service by telephone and the potential availability of an in-person appointment in the future. The patient expressed understanding and agreed to proceed.   Subjective:  [name] is a 83 y.o. male patient of Stacks, [name] Gash, MD who had a Medicare Annual Wellness Visit today via telephone. [name] is Retired and lives with their spouse. he has 2 children. he reports that he is socially active and does interact with friends/family regularly. he is minimally physically active and enjoys playing golf and walking.  Patient Care Team: Claretta Fraise, MD as PCP - General (Family Medicine) Minus Breeding, MD as PCP - Cardiology (Cardiology) Steffanie Rainwater, DPM as Consulting Physician (Podiatry) de Stanford Scotland, MD (Inactive) as Attending Physician (Cardiology)  Advanced Directives 02/05/2019 01/26/2019 10/01/2018 10/24/2017 07/21/2016 04/14/2016 02/20/2016  Does Patient Have a Medical Advance Directive? Yes Yes No Yes Yes Yes Yes  Type of Paramedic of [place];Living will Living will - Living will [place];Living will Living will;Healthcare Power of Attorney Living will;Healthcare Power of Attorney  Does patient want to make changes to medical advance directive? No - Patient declined - - No - Patient declined No - Patient declined - No - Patient declined  Copy of [place] in Chart? No - copy requested - - - No - copy requested No - copy requested -  Would patient like information on creating a  medical advance directive? - - No - Patient declined - - - [facility] Utilization Over the Past 12 Months: # of hospitalizations or ER visits: 0 # of surgeries: 1  Review of Systems    Patient reports that his overall health is unchanged compared to last year.  General ROS: negative  Patient Reported Readings (BP, Pulse, CBG, Weight, etc) BP 140/85 Comment: home reading  Ht 5\' 11"  (1.803 m)   Wt 235 lb (106.6 kg) Comment: at home  BMI 32.78 kg/m    Pain Assessment       Current Medications & Allergies (verified) Allergies as of 02/05/2019      Reactions   Flomax [tamsulosin Hcl] Other (See Comments)   Makes him "swimmy headed"      Medication List       Accurate as of February 05, 2019  9:54 AM. If you have any questions, ask your nurse or doctor.        amLODipine 5 MG tablet Commonly known as: NORVASC Take 1 tablet (5 mg total) by mouth daily.   apixaban 5 MG Tabs tablet Commonly known as: Eliquis Take 1 tablet (5 mg total) by mouth 2 (two) times daily.   atorvastatin 10 MG tablet Commonly known as: LIPITOR Take 10 mg by mouth daily in the afternoon.   CALCIUM 600 + D PO Take 1 tablet by mouth daily.   Coenzyme Q10 300 MG Caps Take 300 mg by mouth daily.   HYDROcodone-acetaminophen 10-325 MG tablet Commonly known as: Norco Take 1 tablet by mouth every 6 (six) hours as needed.   lisinopril 20 MG tablet Commonly known as: ZESTRIL Take 1 tablet (20 mg  total) by mouth daily. What changed: when to take this   metFORMIN 500 MG 24 hr tablet Commonly known as: GLUCOPHAGE-XR TAKE 1 TABLET BY MOUTH EVERY DAY WITH BREAKFAST What changed: See the new instructions.   methocarbamol 500 MG tablet Commonly known as: Robaxin Take 1 tablet (500 mg total) by mouth every 6 (six) hours as needed for muscle spasms.   multivitamin with minerals Tabs tablet Take 1 tablet by mouth daily.   omeprazole 20 MG capsule Commonly known as: PRILOSEC Take 20 mg by  mouth daily.       History (reviewed): Past Medical History:  Diagnosis Date  . Arthritis    knees, shoulder,  hips (10/24/2017)  . Atrial fibrillation ([place])    on eliquis  . BPH (benign prostatic hyperplasia)   . Cataracts, bilateral    removed by surgery  . Coronary artery disease excluded   . Diabetes mellitus without complication ([place])    type 2  . Diverticulosis   . Endocarditis, valve unspecified, unspecified cause   . GERD (gastroesophageal reflux disease)   . H/O mitral valve repair    Postoperative ring with postoperative atrial fibrillation  . Hearing loss    both ears - does not use hearing aids  . Heart murmur    hx  . Hiatal hernia 06/13/2002  . High cholesterol   . History of kidney stones    passed spontaneously x2   . OSA on CPAP    uses cpap nightly  . Stroke ([place])   . Unspecified essential hypertension   . Weakness of both arms 07/19/2012  . Wears dentures    upper and lower partial   Past Surgical History:  Procedure Laterality Date  . CARDIAC CATHETERIZATION  02/14/2007   x 2  . CARDIOVERSION N/A 04/14/2016   Procedure: CARDIOVERSION;  Surgeon: Minus Breeding, MD;  Location: [facility] ENDOSCOPY;  Service: Cardiovascular;  Laterality: N/A;  . CATARACT EXTRACTION W/PHACO Right 05/12/2015   Procedure: CATARACT EXTRACTION PHACO AND INTRAOCULAR LENS PLACEMENT RIGHT EYE CDE=7.75;  Surgeon: Tonny Branch, MD;  [facility];  Service: Ophthalmology;  Laterality: Right;  . CATARACT EXTRACTION W/PHACO Left 06/12/2015   Procedure: CATARACT EXTRACTION PHACO AND INTRAOCULAR LENS PLACEMENT (IOC);  Surgeon: Tonny Branch, MD;  Location: [facility];  Service: Ophthalmology;  Laterality: Left;  CDE:  13.17  . COLONOSCOPY    . INJECTION KNEE Right 02/20/2016   Procedure: KNEE INJECTION;  Surgeon: [name], MD;  Location: [place];  Service: Orthopedics;  Laterality: Right;  . JOINT REPLACEMENT    . KNEE ARTHROSCOPY Left   . LAPAROSCOPIC CHOLECYSTECTOMY    . MITRAL VALVE REPAIR  ~  2003  . MULTIPLE TOOTH EXTRACTIONS    . SHOULDER ARTHROSCOPY WITH ROTATOR CUFF REPAIR AND OPEN BICEPS TENODESIS Right 01/26/2019   Procedure: right shoulder arthroscopy, rotator cuff repair and biceps tenodesis;  Surgeon: [name], MD;  Location: [place];  Service: Orthopedics;  Laterality: Right;  . SHOULDER OPEN ROTATOR CUFF REPAIR Left   . TOTAL KNEE ARTHROPLASTY Left 02/20/2016   Procedure: LEFT TOTAL KNEE ARTHROPLASTY WITH RIGHT KNEE INJECTION;  Surgeon: [name], MD;  Location: [place];  Service: Orthopedics;  Laterality: Left;  . UPPER GI ENDOSCOPY    . VASECTOMY     Family History  Problem Relation Age of Onset  . Congestive Heart Failure Father   . Heart disease Father   . Stroke Brother   . Stroke Sister   . Diabetes Mellitus  II Brother   . Diabetes Mellitus II Sister   . Lung cancer Brother   . Stroke Sister   . Stroke Brother   . Colon cancer Neg Hx   . Esophageal cancer Neg Hx   . Rectal cancer Neg Hx   . Stomach cancer Neg Hx    Social History   Socioeconomic History  . Marital status: Married    Spouse name: GLORIA  . Number of children: 2  . Years of education: Not on file  . Highest education level: Not on file  Occupational History  . Occupation:  Environmental manager    Comment: RETIRED  Tobacco Use  . Smoking status: Former Smoker    Packs/day: 3.00    Years: 35.00    Pack years: 105.00    Types: Cigarettes    Quit date: 02/16/1988    Years since quitting: 30.9  . Smokeless tobacco: Never Used  . Tobacco comment:  Year Quit: 1990   Substance and Sexual Activity  . Alcohol use: No  . Drug use: Never  . Sexual activity: Not Currently  Other Topics Concern  . Not on file  Social History Narrative  . Not on file   Social Determinants of Health   Financial Resource Strain:   . Difficulty of Paying Living Expenses: Not on file  Food Insecurity:   . Worried About Charity fundraiser in the Last Year: Not on file  . Ran Out of Food in the Last  Year: Not on file  Transportation Needs:   . Lack of Transportation (Medical): Not on file  . Lack of Transportation (Non-Medical): Not on file  Physical Activity:   . Days of Exercise per Week: Not on file  . Minutes of Exercise per Session: Not on file  Stress:   . Feeling of Stress : Not on file  Social Connections:   . Frequency of Communication with Friends and Family: Not on file  . Frequency of Social Gatherings with Friends and Family: Not on file  . Attends Religious Services: Not on file  . Active Member of Clubs or Organizations: Not on file  . Attends Archivist Meetings: Not on file  . Marital Status: Not on file    Activities of Daily Living In your present state of health, do you have any difficulty performing the following activities: 02/05/2019 01/26/2019  Hearing? Y -  Comment no hearing aids -  Vision? Y -  Comment readers -  Difficulty concentrating or making decisions? N -  Walking or climbing stairs? N -  Dressing or bathing? N -  Doing errands, shopping? N N  Preparing Food and eating ? N -  Using the Toilet? N -  In the past six months, have you accidently leaked urine? N -  Do you have problems with loss of bowel control? N -  Managing your Medications? N -  Managing your Finances? N -  Housekeeping or managing your Housekeeping? N -  Some recent data might be hidden    Patient Education/ Literacy    Exercise Current Exercise Habits: Home exercise routine, Type of exercise: walking, Time (Minutes): 30, Frequency (Times/Week): 6, Weekly Exercise (Minutes/Week): 180, Intensity: Mild, Exercise limited by: None identified  Diet Patient reports consuming 3 meals a day and 2 snack(s) a day Patient reports that his primary diet is: Regular Patient reports that he does have regular access to food.   Depression Screen PHQ 2/9 Scores 02/05/2019 01/30/2019 11/27/2018 10/06/2018  09/18/2018  PHQ - 2 Score 0 0 0 0 0  PHQ- 9 Score - - - 0 -      Fall Risk Fall Risk  02/05/2019 01/30/2019 11/27/2018 10/06/2018 09/18/2018  Falls in the past year? 1 1 1  0 0  Number falls in past yr: 0 0 0 - -  Injury with Fall? 1 1 - - -  Follow up Falls evaluation completed - - - -     Objective:  [name] seemed alert and oriented and he participated appropriately during our telephone visit.  Blood Pressure Weight BMI  BP Readings from Last 3 Encounters:  02/05/19 140/85  01/30/19 136/78  01/26/19 126/70   Wt Readings from Last 3 Encounters:  02/05/19 235 lb (106.6 kg)  02/01/19 238 lb (108 kg)  01/30/19 242 lb 6.4 oz (110 kg)   BMI Readings from Last 1 Encounters:  02/05/19 32.78 kg/m    *Unable to obtain current vital signs, weight, and BMI due to telephone visit type  Hearing/Vision  . Jayr did  seem to have difficulty with hearing/understanding during the telephone conversation . Reports that he has had a formal eye exam by an eye care professional within the past year . Reports that he has had a formal hearing evaluation within the past year *Unable to fully assess hearing and vision during telephone visit type  Cognitive Function: 6CIT Screen 02/05/2019  What Year? 0 points  What month? 0 points  What time? 0 points  Count back from 20 0 points  Months in reverse 0 points  Repeat phrase 4 points  Total Score 4   (Normal:0-7, Significant for Dysfunction: >8)  Normal Cognitive Function Screening: Yes   Immunization & Health Maintenance Record Immunization History  Administered Date(s) Administered  . Fluad Quad(high Dose 65+) 11/14/2018  . Influenza Nasal 11/29/2016  . Influenza Split 11/15/2013, 12/03/2015  . Influenza, High Dose Seasonal PF 11/29/2016, 11/24/2017  . Influenza, Seasonal, Injecte, Preservative Fre 11/15/2012  . Influenza-Unspecified 11/15/2012  . Pneumococcal Conjugate-13 01/22/2015  . Pneumococcal Polysaccharide-23 02/15/2006  . Td 02/16/1995  . Tdap 01/17/2014    Health  Maintenance  Topic Date Due  . FOOT EXAM  10/25/1945  . OPHTHALMOLOGY EXAM  10/25/1945  . HEMOGLOBIN A1C  07/27/2019  . TETANUS/TDAP  01/18/2024  . INFLUENZA VACCINE  Completed  . PNA vac Low Risk Adult  Completed       Assessment  This is a routine wellness examination for [name].  Health Maintenance: Due or Overdue Health Maintenance Due  Topic Date Due  . FOOT EXAM  10/25/1945  . OPHTHALMOLOGY EXAM  10/25/1945    [name] does not need a referral for Community Assistance: Care Management:   no Social Work:    no Prescription Assistance:  no Nutrition/Diabetes Education:  no   Plan:  Personalized Goals Goals Addressed            This Visit's Progress   . Plan meals       Eat healthy  Stay busy  Play golf again      . Prevent falls        Personalized Health Maintenance & Screening Recommendations  needs foot check for quality metrics only   Lung Cancer Screening Recommended: no (Low Dose CT Chest recommended if Age 75-80 years, 30 pack-year currently smoking OR have quit w/in past 15 years) Hepatitis C Screening recommended: no HIV Screening recommended: no  Advanced Directives: Written information was not  prepared per patient's request.  Referrals & Orders No orders of the defined types were placed in this encounter.   Follow-up Plan . Follow-up with Claretta Fraise, MD as planned     I have personally reviewed and noted the following in the patient's chart:   . Medical and social history . Use of alcohol, tobacco or illicit drugs  . Current medications and supplements . Functional ability and status . Nutritional status . Physical activity . Advanced directives . List of other physicians . Hospitalizations, surgeries, and ER visits in previous 12 months . Vitals . Screenings to include cognitive, depression, and falls . Referrals and appointments  In addition, I have reviewed and discussed with [name] certain  preventive protocols, quality metrics, and best practice recommendations. A written personalized care plan for preventive services as well as general preventive health recommendations is available and can be mailed to the patient at his request.      [place] Dec  02/05/2019

## 2019-02-05 NOTE — Patient Instructions (Signed)
  [name] , Thank you for taking time to come for your Medicare Wellness Visit. I appreciate your ongoing commitment to your health goals. Please review the following plan we discussed and let me know if I can assist you in the future.   These are the goals we discussed: Goals    . Plan meals     Eat healthy  Stay busy  Play golf again      . Prevent falls       This is a list of the screening recommended for you and due dates:  Health Maintenance  Topic Date Due  . Complete foot exam   10/25/1945  . Eye exam for diabetics  10/25/1945  . Hemoglobin A1C  07/27/2019  . Tetanus Vaccine  01/18/2024  . Flu Shot  Completed  . Pneumonia vaccines  Completed

## 2019-02-12 ENCOUNTER — Other Ambulatory Visit (HOSPITAL_COMMUNITY)
Admission: RE | Admit: 2019-02-12 | Discharge: 2019-02-12 | Disposition: A | Discharge status: Home / Self Care | Payer: Medicare Other | Source: Ambulatory Visit | Attending: Cardiology | Admitting: Cardiology

## 2019-02-12 DIAGNOSIS — I4819 Other persistent atrial fibrillation: Secondary | ICD-10-CM | POA: Diagnosis not present

## 2019-02-12 DIAGNOSIS — Z5181 Encounter for therapeutic drug level monitoring: Secondary | ICD-10-CM | POA: Insufficient documentation

## 2019-02-12 LAB — BASIC METABOLIC PANEL
Anion gap: 11 (ref 5–15)
BUN: 15 mg/dL (ref 8–23)
CO2: 28 mmol/L (ref 22–32)
Calcium: 9 mg/dL (ref 8.9–10.3)
Chloride: 100 mmol/L (ref 98–111)
Creatinine, Ser: 0.61 mg/dL (ref 0.61–1.24)
GFR calc Af Amer: >60 mL/min (ref >60)
GFR calc non Af Amer: >60 mL/min (ref >60)
Glucose, Bld: 116 mg/dL — ABNORMAL HIGH (ref 70–99)
Potassium: 4.5 mmol/L (ref 3.5–5.1)
Sodium: 139 mmol/L (ref 135–145)

## 2019-02-12 LAB — CBC
HCT: 44.6 % (ref 39.0–52.0)
Hemoglobin: 14.5 g/dL (ref 13.0–17.0)
MCH: 32.2 pg (ref 26.0–34.0)
MCHC: 32.5 g/dL (ref 30.0–36.0)
MCV: 98.9 fL (ref 80.0–100.0)
Platelets: 255 10*3/uL (ref 150–400)
RBC: 4.51 MIL/uL (ref 4.22–5.81)
RDW: 12.8 % (ref 11.5–15.5)
WBC: 8 10*3/uL (ref 4.0–10.5)
nRBC: 0 % (ref 0.0–0.2)

## 2019-02-14 DIAGNOSIS — G4733 Obstructive sleep apnea (adult) (pediatric): Secondary | ICD-10-CM | POA: Diagnosis not present

## 2019-02-20 ENCOUNTER — Ambulatory Visit (INDEPENDENT_AMBULATORY_CARE_PROVIDER_SITE_OTHER): Payer: Medicare Other | Admitting: *Deleted

## 2019-02-20 ENCOUNTER — Ambulatory Visit: Payer: Medicare Other | Admitting: Cardiovascular Disease

## 2019-02-20 ENCOUNTER — Other Ambulatory Visit: Payer: Self-pay

## 2019-02-20 DIAGNOSIS — I4819 Other persistent atrial fibrillation: Secondary | ICD-10-CM | POA: Diagnosis not present

## 2019-02-20 DIAGNOSIS — Z5181 Encounter for therapeutic drug level monitoring: Secondary | ICD-10-CM | POA: Diagnosis not present

## 2019-02-20 NOTE — Progress Notes (Signed)
Pt was started on Eliquis5mg  twice dailyforatrial fibrillationon 12/15/17.  He denies any problems since starting Eliquis. He denies any bleeding, increased bruising or GI upset.  [place] and had shoulder surgery 02/06/19 by Dr Lorin Mercy.  Progressing well.  Reviewed patients medication list. Pt is notcurrently on any combined P-gp and strong CYP3A4 inhibitors/inducers (ketoconazole, traconazole, ritonavir, carbamazepine, phenytoin, rifampin, St. John's wort). Reviewed labs from 02/12/19. SCr 0.61,Weight 108kg, Dose isappropriate based on age, weight, and SCr. Hgb and HCT: 14.5/44.6  Plts 255k Labs are stable when compared to labs of 07/26/18.  A full discussion of the nature of anticoagulants has been carried out. A benefit/risk analysis has been presented to the patient, so that they understand the justification for choosing anticoagulation with Eliquis at this time. The need for compliance is stressed. Pt is aware to take the medication twice daily. Side effects of potential bleeding are discussed, including unusual colored urine or stools, coughing up blood or coffee ground emesis, nose bleeds or serious fall or head trauma. Discussed signs and symptoms of stroke. The patient should avoid any OTC items containing aspirin or ibuprofen. Avoid alcohol consumption. Call if any signs of abnormal bleeding. Discussed financial obligations and resolved any difficulty in obtaining medication.   Results of lab work from 02/12/19 discussed with patient. 6 month follow up appointment made for 08/27/2019. Orders for lab work week of 08/20/18 given to patient. Pt will have drawn at [facility].

## 2019-02-28 ENCOUNTER — Other Ambulatory Visit: Payer: Self-pay | Admitting: Family Medicine

## 2019-03-01 ENCOUNTER — Encounter: Payer: Self-pay | Admitting: Orthopaedic Surgery

## 2019-03-01 ENCOUNTER — Ambulatory Visit (INDEPENDENT_AMBULATORY_CARE_PROVIDER_SITE_OTHER): Payer: Medicare Other | Admitting: Orthopaedic Surgery

## 2019-03-01 VITALS — BP 114/64 | HR 64 | Ht 71.0 in | Wt 238.0 lb

## 2019-03-01 DIAGNOSIS — M67921 Unspecified disorder of synovium and tendon, right upper arm: Secondary | ICD-10-CM

## 2019-03-01 NOTE — Progress Notes (Signed)
Post-Op Visit Note   Patient: [name]           Date of Birth: 1935-02-20           MRN: [id number] Visit Date: 03/01/2019 PCP: Claretta Fraise, MD   Assessment & Plan: Post 01/26/2019 shoulder arthroscopy rotator cuff debridement superior labral debridement humeral head chondroplasty and open biceps tenodesis.  Rotator cuff had extensive tearing and was retracted and was not able to be repaired.    He has been using his sling will start some therapy for range of motion of the shoulder and then strengthening.  He will avoid biceps pulling for 6 weeks.  I plan to recheck him in 4 weeks.  Chief Complaint:  Chief Complaint  Patient presents with  . Right Shoulder - Follow-up    01/26/2019 Right shoulder arthroscopy, RCR   Visit Diagnoses: No diagnosis found.  Plan: Therapy for range of motion of shoulder avoiding biceps pulling for 6 weeks.  Recheck 4 weeks.  Follow-Up Instructions: No follow-ups on file.   Orders:  No orders of the defined types were placed in this encounter.  No orders of the defined types were placed in this encounter.   Imaging: No results found.  PMFS History: Patient Active Problem List   Diagnosis Date Noted  . Unilateral primary osteoarthritis, right knee 01/15/2016    Priority: Medium  . Rotator cuff impingement syndrome of right shoulder 01/26/2019  . Complete tear of right rotator cuff 01/19/2019  . Biceps tendinopathy of right upper extremity 01/19/2019  . Pain in right shoulder 12/06/2018  . TIA (transient ischemic attack) 12/05/2017  . Type 2 diabetes mellitus without complication ([place]) [id number]  . 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 Diplopia 10/24/2017  . S/P total knee arthroplasty, left 04/29/2016  . Weakness of both arms 07/19/2012  . Colon cancer screening 06/22/2012  . Unspecified venous (peripheral) insufficiency 05/05/2012  . Chronic venous insufficiency 04/07/2012  . Other malaise and fatigue 03/16/2012  .  Disturbance of skin sensation 03/16/2012  . Carotid stenosis 08/16/2011  . Obesity 08/16/2011  . Coronary artery disease excluded   . H/O mitral valve repair   . Endocarditis, valve unspecified, unspecified cause   . Dizziness 12/16/2010  . Dyslipidemia 09/23/2010  . Essential hypertension 04/06/2007  . Sleep apnea 04/06/2007  . DYSPNEA 04/06/2007   Past Medical History:  Diagnosis Date  . Arthritis    knees, shoulder,  hips (10/24/2017)  . Atrial fibrillation ([place])    on eliquis  . BPH (benign prostatic hyperplasia)   . Cataracts, bilateral    removed by surgery  . Coronary artery disease excluded   . Diabetes mellitus without complication ([place])    type 2  . Diverticulosis   . Endocarditis, valve unspecified, unspecified cause   . GERD (gastroesophageal reflux disease)   . H/O mitral valve repair    Postoperative ring with postoperative atrial fibrillation  . Hearing loss    both ears - does not use hearing aids  . Heart murmur    hx  . Hiatal hernia 06/13/2002  . High cholesterol   . History of kidney stones    passed spontaneously x2   . OSA on CPAP    uses cpap nightly  . Stroke ([place])   . Unspecified essential hypertension   . Weakness of both arms 07/19/2012  . Wears dentures    upper and lower partial    Family History  Problem Relation Age of Onset  . Congestive  Heart Failure Father   . Heart disease Father   . Stroke Brother   . Stroke Sister   . Diabetes Mellitus II Brother   . Diabetes Mellitus II Sister   . Lung cancer Brother   . Stroke Sister   . Stroke Brother   . Colon cancer Neg Hx   . Esophageal cancer Neg Hx   . Rectal cancer Neg Hx   . Stomach cancer Neg Hx     Past Surgical History:  Procedure Laterality Date  . CARDIAC CATHETERIZATION  02/14/2007   x 2  . CARDIOVERSION N/A 04/14/2016   Procedure: CARDIOVERSION;  Surgeon: Minus Breeding, MD;  Location[facility] ENDOSCOPY;  Service: Cardiovascular;  Laterality: N/A;  . CATARACT EXTRACTION  W/PHACO Right 05/12/2015   Procedure: CATARACT EXTRACTION PHACO AND INTRAOCULAR LENS PLACEMENT RIGHT EYE CDE=7.75;  Surgeon: Tonny Branch, MD;  [facility];  Service: Ophthalmology;  Laterality: Right;  . CATARACT EXTRACTION W/PHACO Left 06/12/2015   Procedure: CATARACT EXTRACTION PHACO AND INTRAOCULAR LENS PLACEMENT (IOC);  Surgeon: Tonny Branch, MD;  [facility];  Service: Ophthalmology;  Laterality: Left;  CDE:  13.17  . COLONOSCOPY    . INJECTION KNEE Right 02/20/2016   Procedure: KNEE INJECTION;  Surgeon: [name], MD;  Location: [place];  Service: Orthopedics;  Laterality: Right;  . JOINT REPLACEMENT    . KNEE ARTHROSCOPY Left   . LAPAROSCOPIC CHOLECYSTECTOMY    . MITRAL VALVE REPAIR  ~ 2003  . MULTIPLE TOOTH EXTRACTIONS    . SHOULDER ARTHROSCOPY WITH ROTATOR CUFF REPAIR AND OPEN BICEPS TENODESIS Right 01/26/2019   Procedure: right shoulder arthroscopy, rotator cuff repair and biceps tenodesis;  Surgeon: [name], MD;  Location: [place];  Service: Orthopedics;  Laterality: Right;  . SHOULDER OPEN ROTATOR CUFF REPAIR Left   . TOTAL KNEE ARTHROPLASTY Left 02/20/2016   Procedure: LEFT TOTAL KNEE ARTHROPLASTY WITH RIGHT KNEE INJECTION;  Surgeon: [name], MD;  Location: [place];  Service: Orthopedics;  Laterality: Left;  . UPPER GI ENDOSCOPY    . VASECTOMY     Social History   Occupational History  . Occupation:  Environmental manager    Comment: RETIRED  Tobacco Use  . Smoking status: Former Smoker    Packs/day: 3.00    Years: 35.00    Pack years: 105.00    Types: Cigarettes    Quit date: 02/16/1988    Years since quitting: 31.0  . Smokeless tobacco: Never Used  . Tobacco comment:  Year Quit: 1990   Substance and Sexual Activity  . Alcohol use: No  . Drug use: Never  . Sexual activity: Not Currently

## 2019-03-06 DIAGNOSIS — M25511 Pain in right shoulder: Secondary | ICD-10-CM | POA: Diagnosis not present

## 2019-03-09 DIAGNOSIS — R600 Localized edema: Secondary | ICD-10-CM | POA: Diagnosis not present

## 2019-03-09 DIAGNOSIS — I872 Venous insufficiency (chronic) (peripheral): Secondary | ICD-10-CM | POA: Diagnosis not present

## 2019-03-09 DIAGNOSIS — M25511 Pain in right shoulder: Secondary | ICD-10-CM | POA: Diagnosis not present

## 2019-03-12 DIAGNOSIS — M25511 Pain in right shoulder: Secondary | ICD-10-CM | POA: Diagnosis not present

## 2019-03-13 ENCOUNTER — Ambulatory Visit: Payer: Medicare Other

## 2019-03-15 DIAGNOSIS — M25511 Pain in right shoulder: Secondary | ICD-10-CM | POA: Diagnosis not present

## 2019-03-19 DIAGNOSIS — M25511 Pain in right shoulder: Secondary | ICD-10-CM | POA: Diagnosis not present

## 2019-03-21 ENCOUNTER — Encounter: Payer: Self-pay | Admitting: Family Medicine

## 2019-03-21 ENCOUNTER — Other Ambulatory Visit: Payer: Self-pay

## 2019-03-21 ENCOUNTER — Ambulatory Visit (INDEPENDENT_AMBULATORY_CARE_PROVIDER_SITE_OTHER): Payer: Medicare Other | Admitting: Family Medicine

## 2019-03-21 VITALS — BP 127/83 | HR 66 | Temp 96.4°F | Ht 71.0 in | Wt 240.6 lb

## 2019-03-21 DIAGNOSIS — M25472 Effusion, left ankle: Secondary | ICD-10-CM

## 2019-03-21 DIAGNOSIS — M25471 Effusion, right ankle: Secondary | ICD-10-CM

## 2019-03-21 DIAGNOSIS — I10 Essential (primary) hypertension: Secondary | ICD-10-CM

## 2019-03-21 DIAGNOSIS — I69998 Other sequelae following unspecified cerebrovascular disease: Secondary | ICD-10-CM | POA: Diagnosis not present

## 2019-03-21 DIAGNOSIS — E785 Hyperlipidemia, unspecified: Secondary | ICD-10-CM

## 2019-03-21 DIAGNOSIS — R531 Weakness: Secondary | ICD-10-CM

## 2019-03-21 DIAGNOSIS — E119 Type 2 diabetes mellitus without complications: Secondary | ICD-10-CM | POA: Diagnosis not present

## 2019-03-21 DIAGNOSIS — R600 Localized edema: Secondary | ICD-10-CM | POA: Diagnosis not present

## 2019-03-21 LAB — BAYER DCA HB A1C WAIVED: HB A1C (BAYER DCA - WAIVED): 6.7 %

## 2019-03-21 MED ORDER — APIXABAN 5 MG PO TABS: 5.0000 mg | ORAL_TABLET | Freq: Two times a day (BID) | ORAL | 1 refills | Status: DC

## 2019-03-21 MED ORDER — AMLODIPINE BESYLATE 5 MG PO TABS: 5.0000 mg | ORAL_TABLET | Freq: Every day | ORAL | 1 refills | Status: DC

## 2019-03-21 MED ORDER — LISINOPRIL 20 MG PO TABS: 20.0000 mg | ORAL_TABLET | Freq: Every day | ORAL | 1 refills | Status: DC

## 2019-03-21 MED ORDER — ROSUVASTATIN CALCIUM 5 MG PO TABS: 5.0000 mg | ORAL_TABLET | Freq: Every day | ORAL | 3 refills | Status: DC

## 2019-03-21 MED ORDER — OMEPRAZOLE 20 MG PO CPDR: 20.0000 mg | DELAYED_RELEASE_CAPSULE | Freq: Every day | ORAL | 1 refills | Status: DC

## 2019-03-21 MED ORDER — METFORMIN HCL ER 500 MG PO TB24: ORAL_TABLET | ORAL | 1 refills | Status: DC

## 2019-03-21 NOTE — Progress Notes (Signed)
Subjective:  Patient ID: [name],  male    DOB: [birth year]  Age: 84 y.o.    CC: Follow-up (6 month)   HPI [name] presents for  follow-up of hypertension. Patient has no history of headache chest pain or shortness of breath or recent cough. Patient also denies symptoms of TIA such as numbness weakness lateralizing. Patient denies side effects from medication. States taking it regularly.  Patient also  in for follow-up of elevated cholesterol. Doing well without complaints on current medication. Denies side effects  including myalgia and arthralgia and nausea. Also in today for liver function testing. Currently no chest pain, shortness of breath or other cardiovascular related symptoms noted.  Follow-up of diabetes. Patient does not checks blood sugar at home.  Patient denies symptoms such as excessive hunger or urinary frequency, excessive hunger, nausea.  However he does have spells he describes as lasting just a few moments where he feels spell of weakness like all of the energy was running out of him.  This just lasts a few moments.  It is not clear whether its glucose related since he is not checking.  Additionally the patient tells me that he has had some swelling in his feet for about a week.  More than the left than the right.  He was given some antibiotics for this urgent care and that seemed to help a little.  He also purchased an over-the-counter compression stocking for the left.  He continues with the trial of that. No significant hypoglycemic spells noted. Medications reviewed. Pt reports taking them regularly. Pt. denies complication/adverse reaction today.    History [name] has a past medical history of Arthritis, Atrial fibrillation ([place]), BPH (benign prostatic hyperplasia), Cataracts, bilateral, Coronary artery disease excluded, Diabetes mellitus without complication ([place]), Diverticulosis, Endocarditis, valve unspecified, unspecified cause, GERD  (gastroesophageal reflux disease), H/O mitral valve repair, Hearing loss, Heart murmur, Hiatal hernia (06/13/2002), High cholesterol, History of kidney stones, OSA on CPAP, Stroke ([place]), Unspecified essential hypertension, Weakness of both arms (07/19/2012), and Wears dentures.   He has a past surgical history that includes Vasectomy; Mitral valve repair (~ 2003); Cataract extraction w/PHACO (Right, 05/12/2015); Cataract extraction w/PHACO (Left, 06/12/2015); Total knee arthroplasty (Left, 02/20/2016); Injection knee (Right, 02/20/2016); Cardioversion (N/A, 04/14/2016); Laparoscopic cholecystectomy; Knee arthroscopy (Left); Joint replacement; Shoulder open rotator cuff repair (Left); Colonoscopy; Upper gi endoscopy; Multiple tooth extractions; Cardiac catheterization (02/14/2007); and Shoulder arthroscopy with rotator cuff repair and open biceps tenodesis (Right, 01/26/2019).   His family history includes Congestive Heart Failure in his father; Diabetes Mellitus II in his brother and sister; Heart disease in his father; Lung cancer in his brother; Stroke in his brother, brother, sister, and sister.He reports that he quit smoking about 31 years ago. His smoking use included cigarettes. He has a 105.00 pack-year smoking history. He has never used smokeless tobacco. He reports that he does not drink alcohol or use drugs.  Current Outpatient Medications on File Prior to Visit  Medication Sig Dispense Refill  . Calcium Carb-Cholecalciferol (CALCIUM 600 + D PO) Take 1 tablet by mouth daily.    . Coenzyme Q10 300 MG CAPS Take 300 mg by mouth daily.    . Multiple Vitamin (MULTIVITAMIN WITH MINERALS) TABS tablet Take 1 tablet by mouth daily.     No current facility-administered medications on file prior to visit.    ROS Review of Systems  Constitutional: Negative for fever.  Respiratory: Negative for shortness of breath.   Cardiovascular: Positive for  leg swelling. Negative for chest pain.  Musculoskeletal:  Negative for arthralgias.  Skin: Negative for rash.    Objective:  BP 127/83   Pulse 66   Temp (!) 96.4 F (35.8 C) (Temporal)   Ht '5\' 11"'  (1.803 m)   Wt 240 lb 9.6 oz (109.1 kg)   BMI 33.56 kg/m   BP Readings from Last 3 Encounters:  03/21/19 127/83  03/01/19 114/64  02/05/19 140/85    Wt Readings from Last 3 Encounters:  03/21/19 240 lb 9.6 oz (109.1 kg)  03/01/19 238 lb (108 kg)  02/05/19 235 lb (106.6 kg)     Physical Exam Constitutional:      General: He is not in acute distress.    Appearance: He is well-developed.  HENT:     Head: Normocephalic and atraumatic.     Right Ear: External ear normal.     Left Ear: External ear normal.     Nose: Nose normal.  Eyes:     Conjunctiva/sclera: Conjunctivae normal.     Pupils: Pupils are equal, round, and reactive to light.  Cardiovascular:     Rate and Rhythm: Normal rate and regular rhythm.     Heart sounds: Normal heart sounds. No murmur.  Pulmonary:     Effort: Pulmonary effort is normal. No respiratory distress.     Breath sounds: Normal breath sounds. No wheezing or rales.  Abdominal:     Palpations: Abdomen is soft.     Tenderness: There is no abdominal tenderness.  Musculoskeletal:        General: Normal range of motion.     Cervical back: Normal range of motion and neck supple.  Skin:    General: Skin is warm and dry.  Neurological:     Mental Status: He is alert and oriented to person, place, and time.     Deep Tendon Reflexes: Reflexes are normal and symmetric.  Psychiatric:        Behavior: Behavior normal.        Thought Content: Thought content normal.        Judgment: Judgment normal.         Assessment & Plan:   [name] was seen today for follow-up.  Diagnoses and all orders for this visit:  Type 2 diabetes mellitus without complication, without long-term current use of insulin (HCC) -     CBC with Differential/Platelet -     CMP14+EGFR -     Lipid panel -     rosuvastatin  (CRESTOR) 5 MG tablet; Take 1 tablet (5 mg total) by mouth daily. For cholesterol -     Bayer DCA Hb A1c Waived  Essential hypertension -     CBC with Differential/Platelet -     CMP14+EGFR  Dyslipidemia -     Lipid panel  Swelling of both ankles -     Brain natriuretic peptide  Weakness due to old stroke -     TSH  Other orders -     omeprazole (PRILOSEC) 20 MG capsule; Take 1 capsule (20 mg total) by mouth daily. -     metFORMIN (GLUCOPHAGE-XR) 500 MG 24 hr tablet; TAKE 1 TABLET BY MOUTH EVERY DAY WITH BREAKFAST -     lisinopril (ZESTRIL) 20 MG tablet; Take 1 tablet (20 mg total) by mouth at bedtime. -     apixaban (ELIQUIS) 5 MG TABS tablet; Take 1 tablet (5 mg total) by mouth 2 (two) times daily. -     amLODipine (NORVASC) 5  MG tablet; Take 1 tablet (5 mg total) by mouth daily.   I have discontinued Mamadou A. Dolores's atorvastatin, methocarbamol, and HYDROcodone-acetaminophen. I have also changed his omeprazole and lisinopril. Additionally, I am having him start on rosuvastatin. Lastly, I am having him maintain his multivitamin with minerals, Calcium Carb-Cholecalciferol (CALCIUM 600 + D PO), Coenzyme Q10, metFORMIN, apixaban, and amLODipine.  Meds ordered this encounter  Medications  . omeprazole (PRILOSEC) 20 MG capsule    Sig: Take 1 capsule (20 mg total) by mouth daily.    Dispense:  90 capsule    Refill:  1  . metFORMIN (GLUCOPHAGE-XR) 500 MG 24 hr tablet    Sig: TAKE 1 TABLET BY MOUTH EVERY DAY WITH BREAKFAST    Dispense:  90 tablet    Refill:  1  . lisinopril (ZESTRIL) 20 MG tablet    Sig: Take 1 tablet (20 mg total) by mouth at bedtime.    Dispense:  90 tablet    Refill:  1  . apixaban (ELIQUIS) 5 MG TABS tablet    Sig: Take 1 tablet (5 mg total) by mouth 2 (two) times daily.    Dispense:  180 tablet    Refill:  1  . amLODipine (NORVASC) 5 MG tablet    Sig: Take 1 tablet (5 mg total) by mouth daily.    Dispense:  90 tablet    Refill:  1  . rosuvastatin  (CRESTOR) 5 MG tablet    Sig: Take 1 tablet (5 mg total) by mouth daily. For cholesterol    Dispense:  90 tablet    Refill:  3   Patient was advised to try to avoid standing on hard surfaces such as concrete.  Wear good fitting shoes with good arch support.  Elevate the feet when possible.  Compression stockings may be useful as well.  20 to 30 mmHg over-the-counter for now.  He mentions a fitted compression stocking that may become necessary should the swelling continued.  Since his only been there for a week think that it would be premature although he does have varicosities.  Follow-up: Return in about 3 months (around 06/18/2019) for diabetes.  Claretta Fraise, M.D.

## 2019-03-22 ENCOUNTER — Ambulatory Visit: Payer: Medicare Other

## 2019-03-22 DIAGNOSIS — M25511 Pain in right shoulder: Secondary | ICD-10-CM | POA: Diagnosis not present

## 2019-03-22 LAB — CMP14+EGFR
ALT: 15 IU/L (ref 0–44)
AST: 15 IU/L (ref 0–40)
Albumin/Globulin Ratio: 1.8 (ref 1.2–2.2)
Albumin: 4.4 g/dL (ref 3.6–4.6)
Alkaline Phosphatase: 87 IU/L (ref 39–117)
BUN/Creatinine Ratio: 14 (ref 10–24)
BUN: 10 mg/dL (ref 8–27)
Bilirubin Total: 1 mg/dL (ref 0.0–1.2)
CO2: 25 mmol/L (ref 20–29)
Calcium: 9.4 mg/dL (ref 8.6–10.2)
Chloride: 101 mmol/L (ref 96–106)
Creatinine, Ser: 0.74 mg/dL — ABNORMAL LOW (ref 0.76–1.27)
GFR calc Af Amer: 100 mL/min/{1.73_m2} (ref >59)
GFR calc non Af Amer: 87 mL/min/{1.73_m2} (ref >59)
Globulin, Total: 2.5 g/dL (ref 1.5–4.5)
Glucose: 142 mg/dL — ABNORMAL HIGH (ref 65–99)
Potassium: 4.4 mmol/L (ref 3.5–5.2)
Sodium: 142 mmol/L (ref 134–144)
Total Protein: 6.9 g/dL (ref 6.0–8.5)

## 2019-03-22 LAB — CBC WITH DIFFERENTIAL/PLATELET
Basophils Absolute: 0 10*3/uL (ref 0.0–0.2)
Basos: 1 %
EOS (ABSOLUTE): 0.1 10*3/uL (ref 0.0–0.4)
Eos: 1 %
Hematocrit: 44.8 % (ref 37.5–51.0)
Hemoglobin: 15.7 g/dL (ref 13.0–17.7)
Immature Grans (Abs): 0 10*3/uL (ref 0.0–0.1)
Immature Granulocytes: 1 %
Lymphocytes Absolute: 1.5 10*3/uL (ref 0.7–3.1)
Lymphs: 17 %
MCH: 32.8 pg (ref 26.6–33.0)
MCHC: 35 g/dL (ref 31.5–35.7)
MCV: 94 fL (ref 79–97)
Monocytes Absolute: 0.8 10*3/uL (ref 0.1–0.9)
Monocytes: 10 %
Neutrophils Absolute: 6.3 10*3/uL (ref 1.4–7.0)
Neutrophils: 70 %
Platelets: 241 10*3/uL (ref 150–450)
RBC: 4.79 x10E6/uL (ref 4.14–5.80)
RDW: 12.3 % (ref 11.6–15.4)
WBC: 8.8 10*3/uL (ref 3.4–10.8)

## 2019-03-22 LAB — LIPID PANEL
Chol/HDL Ratio: 2.5 ratio (ref 0.0–5.0)
Cholesterol, Total: 123 mg/dL (ref 100–199)
HDL: 49 mg/dL (ref >39)
LDL Chol Calc (NIH): 52 mg/dL (ref 0–99)
Triglycerides: 124 mg/dL (ref 0–149)
VLDL Cholesterol Cal: 22 mg/dL (ref 5–40)

## 2019-03-22 LAB — TSH: TSH: 2.08 u[IU]/mL (ref 0.450–4.500)

## 2019-03-22 LAB — BRAIN NATRIURETIC PEPTIDE: BNP: 75.4 pg/mL (ref 0.0–100.0)

## 2019-03-25 NOTE — Progress Notes (Signed)
Hello Sheena,  Your lab result is normal and/or stable.Some minor variations that are not significant are commonly marked abnormal, but do not represent any medical problem for you.  Best regards, Daelyn Mozer, M.D.

## 2019-03-26 DIAGNOSIS — M25511 Pain in right shoulder: Secondary | ICD-10-CM | POA: Diagnosis not present

## 2019-03-30 DIAGNOSIS — M25511 Pain in right shoulder: Secondary | ICD-10-CM | POA: Diagnosis not present

## 2019-04-02 DIAGNOSIS — M25511 Pain in right shoulder: Secondary | ICD-10-CM | POA: Diagnosis not present

## 2019-04-03 DIAGNOSIS — M79672 Pain in left foot: Secondary | ICD-10-CM | POA: Diagnosis not present

## 2019-04-03 DIAGNOSIS — E1142 Type 2 diabetes mellitus with diabetic polyneuropathy: Secondary | ICD-10-CM | POA: Diagnosis not present

## 2019-04-04 DIAGNOSIS — M25511 Pain in right shoulder: Secondary | ICD-10-CM | POA: Diagnosis not present

## 2019-04-09 DIAGNOSIS — M25511 Pain in right shoulder: Secondary | ICD-10-CM | POA: Diagnosis not present

## 2019-04-12 DIAGNOSIS — M25511 Pain in right shoulder: Secondary | ICD-10-CM | POA: Diagnosis not present

## 2019-04-12 DIAGNOSIS — D225 Melanocytic nevi of trunk: Secondary | ICD-10-CM | POA: Diagnosis not present

## 2019-04-12 DIAGNOSIS — Z08 Encounter for follow-up examination after completed treatment for malignant neoplasm: Secondary | ICD-10-CM | POA: Diagnosis not present

## 2019-04-12 DIAGNOSIS — L280 Lichen simplex chronicus: Secondary | ICD-10-CM | POA: Diagnosis not present

## 2019-04-12 DIAGNOSIS — L570 Actinic keratosis: Secondary | ICD-10-CM | POA: Diagnosis not present

## 2019-04-12 DIAGNOSIS — Z85828 Personal history of other malignant neoplasm of skin: Secondary | ICD-10-CM | POA: Diagnosis not present

## 2019-04-12 DIAGNOSIS — L821 Other seborrheic keratosis: Secondary | ICD-10-CM | POA: Diagnosis not present

## 2019-04-16 DIAGNOSIS — M25511 Pain in right shoulder: Secondary | ICD-10-CM | POA: Diagnosis not present

## 2019-04-17 DIAGNOSIS — E1142 Type 2 diabetes mellitus with diabetic polyneuropathy: Secondary | ICD-10-CM | POA: Diagnosis not present

## 2019-04-17 DIAGNOSIS — R601 Generalized edema: Secondary | ICD-10-CM | POA: Diagnosis not present

## 2019-04-19 DIAGNOSIS — M25511 Pain in right shoulder: Secondary | ICD-10-CM | POA: Diagnosis not present

## 2019-04-23 DIAGNOSIS — M25511 Pain in right shoulder: Secondary | ICD-10-CM | POA: Diagnosis not present

## 2019-04-26 DIAGNOSIS — M25511 Pain in right shoulder: Secondary | ICD-10-CM | POA: Diagnosis not present

## 2019-05-01 DIAGNOSIS — M25511 Pain in right shoulder: Secondary | ICD-10-CM | POA: Diagnosis not present

## 2019-05-03 ENCOUNTER — Ambulatory Visit: Payer: Medicare Other | Admitting: Cardiovascular Disease

## 2019-05-03 ENCOUNTER — Encounter: Payer: Self-pay | Admitting: Cardiovascular Disease

## 2019-05-03 ENCOUNTER — Other Ambulatory Visit: Payer: Self-pay

## 2019-05-03 DIAGNOSIS — Z9889 Other specified postprocedural states: Secondary | ICD-10-CM | POA: Diagnosis not present

## 2019-05-03 DIAGNOSIS — G4733 Obstructive sleep apnea (adult) (pediatric): Secondary | ICD-10-CM | POA: Diagnosis not present

## 2019-05-03 DIAGNOSIS — I10 Essential (primary) hypertension: Secondary | ICD-10-CM | POA: Diagnosis not present

## 2019-05-03 DIAGNOSIS — I482 Chronic atrial fibrillation, unspecified: Secondary | ICD-10-CM

## 2019-05-03 DIAGNOSIS — Z7901 Long term (current) use of anticoagulants: Secondary | ICD-10-CM

## 2019-05-03 DIAGNOSIS — E118 Type 2 diabetes mellitus with unspecified complications: Secondary | ICD-10-CM

## 2019-05-03 NOTE — Patient Instructions (Signed)

## 2019-05-04 DIAGNOSIS — M25511 Pain in right shoulder: Secondary | ICD-10-CM | POA: Diagnosis not present

## 2019-05-05 ENCOUNTER — Encounter: Payer: Self-pay | Admitting: Cardiovascular Disease

## 2019-05-05 NOTE — Progress Notes (Signed)
Cardiology Office Note    Date:  05/05/2019   ID:  AXTON CIHLAR, DOB [birth year], MRN [id number]  PCP:  Claretta Fraise, MD  Cardiologist:  Shelva Majestic, MD (sleep); Dr. Percival Spanish  F/U sleep clinic evaluation  History of Present Illness:  [name] is a 84 y.o. male who presents for a 15 month follow-up sleep clinic evaluation following initiation of CPAP therapy for sleep apnea.  Mr. Haymer is followed by Dr. Percival Spanish for primary cardiology care.  He has a history of mitral valve repair as well as atrial fibrillation.  He had undergone DC cardioversion, but subsequently, he developed recurrent AF.  Due to concerns for sleep apnea he was referred for a sleep study and was found.  to have severe obstructive sleep apnea with an AHI of 55.4 per hour.  AHI during REM sleep was 57.5 per hour.  He had significant oxygen desaturation to 80% with non-REM and 72% during REM sleep.  There was loud snoring.  CPAP was implemented and was titrated to an optimal pressure of 14 cm.  His CPAP set up date was 08/23/2016 and he has a ResMed air since 10 AutoSet unit.  He has been using a ResMed airFit F 20 medium size mask.  A download was obtained from August 26 to 11/08/2016.  His compliance is excellent at 100%.  He is averaging 8 hours and 39 minutes per night of sleep.  At a set pressure of 14 cm, however, AHI was still mildly elevated at 6.5, with an apnea index of 5.6.  I saw him for initial sleep clinic evaluation in September 2018 at which time he had noted a significant improvement since CPAP initiation.  Previously he had experienced loud snoring and nocturia at least 3-4 times per night.  He now is unaware of any snoring.  Most nights he can sleep without urination but at times he wakes up 1 time per night.  He feels more refreshed.  His sleep is restorative.  He denies any daytime sleepiness,  bruxism, restless legs, hypnogognic hallucinations, or cataplexy.  An Epworth Sleepiness Scale score was  calculated in the office which endorsed at 8 shown below.  Epworth Sleepiness Scale: Situation   Chance of Dozing/Sleeping (0 = never , 1 = slight chance , 2 = moderate chance , 3 = high chance )   sitting and reading 1   watching TV 3   sitting inactive in a public place 0   being a passenger in a motor vehicle for an hour or more 0   lying down in the afternoon 2   sitting and talking to someone 0   sitting quietly after lunch (no alcohol) 2   while stopped for a few minutes in traffic as the driver 0   Total Score  8   I saw him for 1 year follow-up in December 2019 at which time he was doing well.  [name] has continued to derive benefit from CPAP therapy.  He typically goes to bed around 10 PM but often times turns the TV off at 11 PM.  He typically wakes up between 7 or 8 AM.  He believes he is sleeping well.  If situations permit he does take a daily nap.  A new Epworth scale was calculated in the office today and this endorsed at 6.  At times he has had some difficulty with the pressure on the bridge of his nose from his full facemask.  During that  evaluation, a new download was obtained from November 18 through January 31, 2018.  Compliance remained excellent and his auto pressure ranged from 12 to 20 cm of water and AHI was 6.2.  He was requiring almost maximal pressure with his 95 percentile pressure at 19.6.  With calluses on the bridge of his nose secondary to his full facemask I have suggested transition to the new DreamWear technology.  Over the past year, he has continued to do well.  He has the DreamWear mask but he has had irritation resulting from the facial cushion.  A new download from February 15 through May 01, 2019 was obtained.  Compliance remains excellent but average sleep duration was 6 hours and 14 minutes.  His CPAP Auto unit was set at a range of 16-20 and again his 95th percentile pressure was near maximum at 19.8.  He believes he is sleeping well.  He is unaware  of breakthrough snoring.  I calculated a new Epworth Sleepiness Scale score today in the office and this endorsed at 5 arguing against residual daytime sleepiness.  He has continued to be on amlodipine 5 mg and lisinopril 20 mg for hypertension.  He has atrial fibrillation with a controlled rate and continues to be on anticoagulation with Eliquis without bleeding.  He is diabetic on metformin.  He has been on low-dose rosuvastatin for hyperlipidemia with target LDL less than 70.  He presents for evaluation.  Past Medical History:  Diagnosis Date  . Arthritis    knees, shoulder,  hips (10/24/2017)  . Atrial fibrillation ([place])    on eliquis  . BPH (benign prostatic hyperplasia)   . Cataracts, bilateral    removed by surgery  . Coronary artery disease excluded   . Diabetes mellitus without complication ([place])    type 2  . Diverticulosis   . Endocarditis, valve unspecified, unspecified cause   . GERD (gastroesophageal reflux disease)   . H/O mitral valve repair    Postoperative ring with postoperative atrial fibrillation  . Hearing loss    both ears - does not use hearing aids  . Heart murmur    hx  . Hiatal hernia 06/13/2002  . High cholesterol   . History of kidney stones    passed spontaneously x2   . OSA on CPAP    uses cpap nightly  . Stroke ([place])   . Unspecified essential hypertension   . Weakness of both arms 07/19/2012  . Wears dentures    upper and lower partial    Past Surgical History:  Procedure Laterality Date  . CARDIAC CATHETERIZATION  02/14/2007   x 2  . CARDIOVERSION N/A 04/14/2016   Procedure: CARDIOVERSION;  Surgeon: Minus Breeding, MD;  Location: [facility] ENDOSCOPY;  Service: Cardiovascular;  Laterality: N/A;  . CATARACT EXTRACTION W/PHACO Right 05/12/2015   Procedure: CATARACT EXTRACTION PHACO AND INTRAOCULAR LENS PLACEMENT RIGHT EYE CDE=7.75;  Surgeon: Tonny Branch, MD;  Location: [facility] ORS;  Service: Ophthalmology;  Laterality: Right;  . CATARACT EXTRACTION W/PHACO Left  06/12/2015   Procedure: CATARACT EXTRACTION PHACO AND INTRAOCULAR LENS PLACEMENT (IOC);  Surgeon: Tonny Branch, MD;  Location: [facility] ORS;  Service: Ophthalmology;  Laterality: Left;  CDE:  13.17  . COLONOSCOPY    . INJECTION KNEE Right 02/20/2016   Procedure: KNEE INJECTION;  Surgeon: [name], MD;  Location: [facility];  Service: Orthopedics;  Laterality: Right;  . JOINT REPLACEMENT    . KNEE ARTHROSCOPY Left   . LAPAROSCOPIC CHOLECYSTECTOMY    . MITRAL  VALVE REPAIR  ~ 2003  . MULTIPLE TOOTH EXTRACTIONS    . SHOULDER ARTHROSCOPY WITH ROTATOR CUFF REPAIR AND OPEN BICEPS TENODESIS Right 01/26/2019   Procedure: right shoulder arthroscopy, rotator cuff repair and biceps tenodesis;  Surgeon: [name], MD;  Location: [facility];  Service: Orthopedics;  Laterality: Right;  . SHOULDER OPEN ROTATOR CUFF REPAIR Left   . TOTAL KNEE ARTHROPLASTY Left 02/20/2016   Procedure: LEFT TOTAL KNEE ARTHROPLASTY WITH RIGHT KNEE INJECTION;  Surgeon: [name], MD;  Location: [facility];  Service: Orthopedics;  Laterality: Left;  . UPPER GI ENDOSCOPY    . VASECTOMY      Current Medications: Outpatient Medications Prior to Visit  Medication Sig Dispense Refill  . amLODipine (NORVASC) 5 MG tablet Take 1 tablet (5 mg total) by mouth daily. 90 tablet 1  . apixaban (ELIQUIS) 5 MG TABS tablet Take 1 tablet (5 mg total) by mouth 2 (two) times daily. 180 tablet 1  . Calcium Carb-Cholecalciferol (CALCIUM 600 + D PO) Take 1 tablet by mouth daily.    . Coenzyme Q10 300 MG CAPS Take 300 mg by mouth daily.    Marland Kitchen lisinopril (ZESTRIL) 20 MG tablet Take 1 tablet (20 mg total) by mouth at bedtime. 90 tablet 1  . metFORMIN (GLUCOPHAGE-XR) 500 MG 24 hr tablet TAKE 1 TABLET BY MOUTH EVERY DAY WITH BREAKFAST 90 tablet 1  . Multiple Vitamin (MULTIVITAMIN WITH MINERALS) TABS tablet Take 1 tablet by mouth daily.    Marland Kitchen omeprazole (PRILOSEC) 20 MG capsule Take 1 capsule (20 mg total) by mouth daily. 90 capsule 1  . rosuvastatin (CRESTOR) 5 MG tablet  Take 1 tablet (5 mg total) by mouth daily. For cholesterol 90 tablet 3   No facility-administered medications prior to visit.     Allergies:   Flomax [tamsulosin hcl]   Social History   Socioeconomic History  . Marital status: Married    Spouse name: GLORIA  . Number of children: 2  . Years of education: Not on file  . Highest education level: Not on file  Occupational History  . Occupation:  Environmental manager    Comment: RETIRED  Tobacco Use  . Smoking status: Former Smoker    Packs/day: 3.00    Years: 35.00    Pack years: 105.00    Types: Cigarettes    Quit date: 02/16/1988    Years since quitting: 31.2  . Smokeless tobacco: Never Used  . Tobacco comment:  Year Quit: 1990   Substance and Sexual Activity  . Alcohol use: No  . Drug use: Never  . Sexual activity: Not Currently  Other Topics Concern  . Not on file  Social History Narrative  . Not on file   Social Determinants of Health   Financial Resource Strain:   . Difficulty of Paying Living Expenses:   Food Insecurity:   . Worried About Charity fundraiser in the Last Year:   . Arboriculturist in the Last Year:   Transportation Needs:   . Film/video editor (Medical):   Marland Kitchen Lack of Transportation (Non-Medical):   Physical Activity:   . Days of Exercise per Week:   . Minutes of Exercise per Session:   Stress:   . Feeling of Stress :   Social Connections:   . Frequency of Communication with Friends and Family:   . Frequency of Social Gatherings with Friends and Family:   . Attends Religious Services:   . Active Member of  Clubs or Organizations:   . Attends Archivist Meetings:   Marland Kitchen Marital Status:      Family History:  The patient's family history includes Congestive Heart Failure in his father; Diabetes Mellitus II in his brother and sister; Heart disease in his father; Lung cancer in his brother; Stroke in his brother, brother, sister, and sister.   ROS General: Negative; No fevers,  chills, or night sweats;  HEENT: Negative; No changes in vision or hearing, sinus congestion, difficulty swallowing Pulmonary: Negative; No cough, wheezing, shortness of breath, hemoptysis Cardiovascular: Status post mitral valve repair.  Atrial fibrillation GI: Negative; No nausea, vomiting, diarrhea, or abdominal pain GU: Negative; No dysuria, hematuria, or difficulty voiding Musculoskeletal: Negative; no myalgias, joint pain, or weakness Hematologic/Oncology: Negative; no easy bruising, bleeding Endocrine: Negative; no heat/cold intolerance; no diabetes Neuro: Negative; no changes in balance, headaches Skin: Negative; No rashes or skin lesions Psychiatric: Negative; No behavioral problems, depression Sleep: See history of present illness  Other comprehensive 14 point system review is negative.   PHYSICAL EXAM:   VS:  BP (!) 142/78   Pulse (!) 58   Ht '5\' 11"'  (1.803 m)   Wt 249 lb (112.9 kg)   SpO2 95%   BMI 34.73 kg/m     Repeat blood pressure by me was 130/78  Wt Readings from Last 3 Encounters:  05/03/19 249 lb (112.9 kg)  03/21/19 240 lb 9.6 oz (109.1 kg)  03/01/19 238 lb (108 kg)      Physical Exam BP (!) 142/78   Pulse (!) 58   Ht '5\' 11"'  (1.803 m)   Wt 249 lb (112.9 kg)   SpO2 95%   BMI 34.73 kg/m  General: Alert, oriented, no distress.  Skin: normal turgor, no rashes, warm and dry; mild skin irritation resulting from his CPAP mask HEENT: Normocephalic, atraumatic. Pupils equal round and reactive to light; sclera anicteric; extraocular muscles intact;  Nose without nasal septal hypertrophy Mouth/Parynx benign; Mallinpatti scale 3 Neck: No JVD, no carotid bruits; normal carotid upstroke Lungs: clear to ausculatation and percussion; no wheezing or rales Chest wall: without tenderness to palpitation Heart: PMI not displaced, irregular irregular rhythm consistent with atrial fibrillation with rate in 60s, s1 s2 normal, 1/6 systolic murmur, no diastolic murmur, no  rubs, gallops, thrills, or heaves Abdomen: soft, nontender; no hepatosplenomehaly, BS+; abdominal aorta nontender and not dilated by palpation. Back: no CVA tenderness Pulses 2+ Musculoskeletal: full range of motion, normal strength, no joint deformities Extremities: no clubbing cyanosis or edema, Homan's sign negative  Neurologic: grossly nonfocal; Cranial nerves grossly wnl Psychologic: Normal mood and affect   Studies/Labs Reviewed:   EKG:  EKG is not ordered today.  I personally reviewed the most recent ECG done by Dr. Percival Spanish from December 01, 2018 which showed atrial fibrillation at 66 bpm; QTc interval 413 ms  Septemper 2018 ECG (independently read by me):  Atrial fibrillation at 63 bpm.  QTc interval 407 msec  Recent Labs: BMP Latest Ref Rng & Units 03/21/2019 02/12/2019 01/26/2019  Glucose 65 - 99 mg/dL 142(H) 116(H) 158(H)  BUN 8 - 27 mg/dL '10 15 16  ' Creatinine 0.76 - 1.27 mg/dL 0.74(L) 0.61 0.79  BUN/Creat Ratio 10 - 24 14 - -  Sodium 134 - 144 mmol/L 142 139 140  Potassium 3.5 - 5.2 mmol/L 4.4 4.5 3.8  Chloride 96 - 106 mmol/L 101 100 103  CO2 20 - 29 mmol/L '25 28 25  ' Calcium 8.6 - 10.2 mg/dL 9.4 9.0  9.2     Hepatic Function Latest Ref Rng & Units 03/21/2019 09/18/2018 10/24/2017  Total Protein 6.0 - 8.5 g/dL 6.9 7.1 7.2  Albumin 3.6 - 4.6 g/dL 4.4 4.4 4.1  AST 0 - 40 IU/L '15 16 22  ' ALT 0 - 44 IU/L '15 19 18  ' Alk Phosphatase 39 - 117 IU/L 87 80 68  Total Bilirubin 0.0 - 1.2 mg/dL 1.0 0.7 0.9    CBC Latest Ref Rng & Units 03/21/2019 02/12/2019 01/26/2019  WBC 3.4 - 10.8 x10E3/uL 8.8 8.0 8.4  Hemoglobin 13.0 - 17.7 g/dL 15.7 14.5 15.3  Hematocrit 37.5 - 51.0 % 44.8 44.6 45.1  Platelets 150 - 450 x10E3/uL 241 255 229   Lab Results  Component Value Date   MCV 94 03/21/2019   MCV 98.9 02/12/2019   MCV 96.6 01/26/2019   Lab Results  Component Value Date   TSH 2.080 03/21/2019   Lab Results  Component Value Date   HGBA1C 6.7 03/21/2019     BNP    Component  Value Date/Time   BNP 75.4 03/21/2019 0921    ProBNP No results found for: PROBNP   Lipid Panel     Component Value Date/Time   CHOL 123 03/21/2019 0921   TRIG 124 03/21/2019 0921   HDL 49 03/21/2019 0921   CHOLHDL 2.5 03/21/2019 0921   CHOLHDL 4.5 10/25/2017 0354   VLDL 37 10/25/2017 0354   LDLCALC 52 03/21/2019 0921     RADIOLOGY: No results found.   Additional studies/ records that were reviewed today include:  I reviewed the records from Dr. Percival Spanish.  I reviewed his prior sleep study and obtain a new download to assess efficacy and compliance and obtain a new Epworth Sleepiness Scale score today.  ASSESSMENT:    1. OSA (obstructive sleep apnea)   2. Chronic atrial fibrillation (HCC)   3. Essential hypertension   4. Anticoagulation adequate   5. H/O mitral valve repair   6. Type 2 diabetes mellitus with complication, without long-term current use of insulin [facility])    PLAN:  Mr. Raisanen is a very pleasant 84 year-old gentleman who has cardiovascular comorbidities including mitral valve repair, atrial fibrillation, hypertension, and mild carotid plaque.  He had undergone cardioversion for atrial fibrillation, but unfortunately develop recurrent atrial fibrillation. He was found to have severe obstructive sleep apnea on the baseline portion of his split-night sleep evaluation with significant oxygen desaturation to a nadir of 72% during REM sleep.  He has been on CPAP therapy since his set up date of August 23, 2016.  Since CPAP initiation he has continued to be compliant with CPAP therapy.  He has required near maximal CPAP pressure and was seen 1 year ago his CPAP auto adjustments were changed to a minimum of 16 with maximum of 20.  His more recent download from February 15 through May 01, 2019 again confirms 100% compliance.  However sleep duration was good at 6 hours and 14 minutes but I have recommended longer duration of CPAP use per night.  His 95th percentile pressure  continues to be very elevated at 19.8 with a maximum average pressure at 19.9.  Current AHI is 4.4.  I discussed with him that if he does require higher pressures we will need to transition to BiPAP therapy but at present he seems to be doing well.  When I saw him last year he having difficulty with the bridge of his nose from the old full facemask.  The DreamWear technology  with a Respironics mask has been better.  However the angulation is such where he is having some irritation.  We have provided him with a trial of a new ResMed air fit F 30i mask which has different angulation to the nose piece below the nose which should reduce some of the prior irritation.  He has mild obesity.  BMI is 34.78.  We discussed the importance of weight with reference to his sleep apnea.  He has gained 4 pounds since his prior evaluation with me.  Repeat blood pressure by me today was improved at 130/78 and he has continued to be on amlodipine 5 mg and lisinopril 20 mg daily for hypertension.  He continues to be on Eliquis for atrial fibrillation and is without bleeding.  He is diabetic on metformin.  He continues to be on low-dose rosuvastatin in light of his plaque.  LDL cholesterol in August 2020 was excellent at 33.  He will return to the cardiology care of Dr. Percival Spanish.  Per Medicare records I will see him in 1 year for reevaluation.  Medication Adjustments/Labs and Tests Ordered: Current medicines are reviewed at length with the patient today.  Concerns regarding medicines are outlined above.  Medication changes, Labs and Tests ordered today are listed in the Patient Instructions below. Patient Instructions  Medication Instructions:  CONTINUE WITH CURRENT MEDICATIONS. NO CHANGES.  *If you need a refill on your cardiac medications before your next appointment, please call your pharmacy*  Follow-Up: At [facility], you and your health needs are our priority.  As part of our continuing mission to provide you with  exceptional heart care, we have created designated Provider Care Teams.  These Care Teams include your primary Cardiologist (physician) and Advanced Practice Providers (APPs -  Physician Assistants and Nurse Practitioners) who all work together to provide you with the care you need, when you need it.  We recommend signing up for the patient portal called "MyChart".  Sign up information is provided on this After Visit Summary.  MyChart is used to connect with patients for Virtual Visits (Telemedicine).  Patients are able to view lab/test results, encounter notes, upcoming appointments, etc.  Non-urgent messages can be sent to your provider as well.   To learn more about what you can do with MyChart, go to [email or web address].    Your next appointment:   12 month(s)  The format for your next appointment:   In Person  Provider:   Shelva Majestic, MD      Signed, Shelva Majestic, MD  05/05/2019 12:10 PM    [facility] [address] [place], [place], [place]  [address] Phone: [phone]

## 2019-05-06 ENCOUNTER — Other Ambulatory Visit: Payer: Self-pay | Admitting: Family Medicine

## 2019-05-07 DIAGNOSIS — M25511 Pain in right shoulder: Secondary | ICD-10-CM | POA: Diagnosis not present

## 2019-05-10 DIAGNOSIS — M25511 Pain in right shoulder: Secondary | ICD-10-CM | POA: Diagnosis not present

## 2019-05-14 DIAGNOSIS — M25511 Pain in right shoulder: Secondary | ICD-10-CM | POA: Diagnosis not present

## 2019-05-17 DIAGNOSIS — M25511 Pain in right shoulder: Secondary | ICD-10-CM | POA: Diagnosis not present

## 2019-05-21 ENCOUNTER — Ambulatory Visit (HOSPITAL_COMMUNITY)
Admission: RE | Admit: 2019-05-21 | Discharge: 2019-05-21 | Disposition: A | Discharge status: Home / Self Care | Payer: Medicare Other | Source: Ambulatory Visit | Attending: Family Medicine | Admitting: Family Medicine

## 2019-05-21 ENCOUNTER — Other Ambulatory Visit: Payer: Self-pay

## 2019-05-21 ENCOUNTER — Ambulatory Visit (INDEPENDENT_AMBULATORY_CARE_PROVIDER_SITE_OTHER): Payer: Medicare Other | Admitting: Family Medicine

## 2019-05-21 ENCOUNTER — Encounter: Payer: Self-pay | Admitting: Family Medicine

## 2019-05-21 VITALS — BP 138/80 | HR 77 | Temp 99.1°F | Ht 71.0 in | Wt 247.0 lb

## 2019-05-21 DIAGNOSIS — N5089 Other specified disorders of the male genital organs: Secondary | ICD-10-CM

## 2019-05-21 DIAGNOSIS — R319 Hematuria, unspecified: Secondary | ICD-10-CM

## 2019-05-21 LAB — MICROSCOPIC EXAMINATION
Bacteria, UA: NONE SEEN
Epithelial Cells (non renal): NONE SEEN /hpf (ref 0–10)
RBC, Urine: NONE SEEN /hpf (ref 0–2)
Renal Epithel, UA: NONE SEEN /hpf
WBC, UA: NONE SEEN /hpf (ref 0–5)

## 2019-05-21 LAB — URINALYSIS, COMPLETE
Bilirubin, UA: NEGATIVE
Glucose, UA: NEGATIVE
Ketones, UA: NEGATIVE
Leukocytes,UA: NEGATIVE
Nitrite, UA: NEGATIVE
Protein,UA: NEGATIVE
Specific Gravity, UA: 1.02 (ref 1.005–1.030)
Urobilinogen, Ur: 0.2 mg/dL (ref 0.2–1.0)
pH, UA: 7 (ref 5.0–7.5)

## 2019-05-21 MED ORDER — DOXYCYCLINE HYCLATE 100 MG PO TABS: 100.0000 mg | ORAL_TABLET | Freq: Two times a day (BID) | ORAL | 0 refills | Status: AC

## 2019-05-21 MED ORDER — CEFTRIAXONE SODIUM 500 MG IJ SOLR
500.0000 mg | Freq: Once | INTRAMUSCULAR | Status: AC
  Administered 2019-05-21: 500 mg via INTRAMUSCULAR

## 2019-05-21 NOTE — Patient Instructions (Addendum)
I think this is a hydrocele but I am getting an ultrasound to confirm and have referred to urology in [place] as well.  I am empirically treating you with antibiotics in the meantime given blood in urine.   Hydrocele, Adult A hydrocele is a collection of fluid in the loose pouch of skin that holds the testicles (scrotum). This may happen because:  The amount of fluid produced in the scrotum is not absorbed by the rest of the body.  Fluid from the abdomen fills the scrotum. Normally, the testicles develop in the abdomen then move (drop) into to the scrotum before birth. The tube that the testicles travel through usually closes after the testicles drop. If the tube does not close, fluid from the abdomen can fill the scrotum. This is less common in adults. What are the causes? The cause of a hydrocele in adults is usually not known. However, it may be caused by:  An injury to the scrotum.  An infection (epididymitis).  Decreased blood flow to the scrotum.  Twisting of a testicle (testicular torsion).  A birth defect.  A tumor or cancer of the testicle. What are the signs or symptoms? A hydrocele feels like a water-filled balloon. It may also feel heavy. Other symptoms include:  Swelling of the scrotum. The swelling may decrease when you lie down. You may also notice more swelling at night than in the morning.  Swelling of the groin.  Mild discomfort in the scrotum.  Pain. This can develop if the hydrocele was caused by infection or twisting. The larger the hydrocele, the more likely you are to have pain. How is this diagnosed? This condition may be diagnosed based on:  Physical exam.  Medical history. You may also have other tests, including:  Imaging tests, such as ultrasound.  Blood or urine tests. How is this treated? Most hydroceles go away on their own. If you have no discomfort or pain, your health care provider may suggest close monitoring of your condition  (called watch and wait or watchful waiting) until the condition goes away or symptoms develop. If treatment is needed, it may include:  Treating an underlying condition. This may include using an antibiotic medicine to treat an infection.  Surgery to stop fluid from collecting in the scrotum.  Surgery to drain the fluid. Options include: ? Needle aspiration. A needle is used to drain fluid. However, the fluid buildup will come back quickly. ? Hydrocelectomy. For this procedure, an incision is made in the scrotum to remove the fluid sac. Follow these instructions at home:  Watch the hydrocele for any changes.  Take over-the-counter and prescription medicines only as told by your health care provider.  If you were prescribed an antibiotic medicine, use it as told by your health care provider. Do not stop taking the antibiotic even if you start to feel better.  Keep all follow-up visits as told by your health care provider. This is important. Contact a health care provider if:  You notice any changes in the hydrocele.  The swelling in your scrotum or groin gets worse.  The hydrocele becomes red, firm, painful, or tender to the touch.  You have a fever. Get help right away if you:  Develop a lot of pain, or your pain becomes worse. Summary  A hydrocele is a collection of fluid in the loose pouch of skin that holds the testicles (scrotum).  Hydroceles can cause swelling, discomfort, and sometimes pain.  In adults, the cause of a  hydrocele usually is not known. However, it is sometimes caused by an infection or a rotation and twisting of the scrotum.  Treatment is usually not needed. Hydroceles often go away on their own. If a hydrocele causes pain, treatment may be given to ease the pain. This information is not intended to replace advice given to you by your health care provider. Make sure you discuss any questions you have with your health care provider. Document Revised:  02/12/2017 Document Reviewed: 02/12/2017 Elsevier Patient Education  2020 [employer].

## 2019-05-21 NOTE — Progress Notes (Signed)
Subjective: CC: hematuria PCP: Claretta Fraise, MD [id number] [name] is [name] 84 y.o. male presenting to clinic today for:  1. Hematuria Patient reports on Saturday he noticed scant blood in his underwear.  He has been having some scrotal swelling over the last couple of days.  No preceding injury but he was riding [name] lawn more for [name] while before onset of symptoms.  Pain is present with certain sitting positions.  Denies any penile discharge, fevers, dysuria or hematuria.  He denies any sexual activity.  No abdominal pain.  Urine output is normal.  No treatments.   ROS: Per HPI  Allergies  Allergen Reactions  . Flomax [Tamsulosin Hcl] Other (See Comments)    Makes him "swimmy headed"   Past Medical History:  Diagnosis Date  . Arthritis    knees, shoulder,  hips (10/24/2017)  . Atrial fibrillation ([place])    on eliquis  . BPH (benign prostatic hyperplasia)   . Cataracts, bilateral    removed by surgery  . Coronary artery disease excluded   . Diabetes mellitus without complication ([place])    type 2  . Diverticulosis   . Endocarditis, valve unspecified, unspecified cause   . GERD (gastroesophageal reflux disease)   . H/O mitral valve repair    Postoperative ring with postoperative atrial fibrillation  . Hearing loss    both ears - does not use hearing aids  . Heart murmur    hx  . Hiatal hernia 06/13/2002  . High cholesterol   . History of kidney stones    passed spontaneously x2   . OSA on CPAP    uses cpap nightly  . Stroke ([place])   . Unspecified essential hypertension   . Weakness of both arms 07/19/2012  . Wears dentures    upper and lower partial    Current Outpatient Medications:  .  amLODipine (NORVASC) 5 MG tablet, Take 1 tablet (5 mg total) by mouth daily., Disp: 90 tablet, Rfl: 1 .  apixaban (ELIQUIS) 5 MG TABS tablet, Take 1 tablet (5 mg total) by mouth 2 (two) times daily., Disp: 180 tablet, Rfl: 1 .  Calcium Carb-Cholecalciferol (CALCIUM 600 + D PO), Take 1  tablet by mouth daily., Disp: , Rfl:  .  Coenzyme Q10 300 MG CAPS, Take 300 mg by mouth daily., Disp: , Rfl:  .  lisinopril (ZESTRIL) 20 MG tablet, Take 1 tablet (20 mg total) by mouth at bedtime., Disp: 90 tablet, Rfl: 1 .  metFORMIN (GLUCOPHAGE-XR) 500 MG 24 hr tablet, TAKE 1 TABLET BY MOUTH EVERY DAY WITH BREAKFAST, Disp: 90 tablet, Rfl: 1 .  Multiple Vitamin (MULTIVITAMIN WITH MINERALS) TABS tablet, Take 1 tablet by mouth daily., Disp: , Rfl:  .  omeprazole (PRILOSEC) 20 MG capsule, Take 1 capsule (20 mg total) by mouth daily., Disp: 90 capsule, Rfl: 1 .  rosuvastatin (CRESTOR) 5 MG tablet, Take 1 tablet (5 mg total) by mouth daily. For cholesterol, Disp: 90 tablet, Rfl: 3 Social History   Socioeconomic History  . Marital status: Married    Spouse name: GLORIA  . Number of children: 2  . Years of education: Not on file  . Highest education level: Not on file  Occupational History  . Occupation:  Environmental manager    Comment: RETIRED  Tobacco Use  . Smoking status: Former Smoker    Packs/day: 3.00    Years: 35.00    Pack years: 105.00    Types: Cigarettes    Quit date: 02/16/1988  Years since quitting: 31.2  . Smokeless tobacco: Never Used  . Tobacco comment:  Year Quit: 1990   Substance and Sexual Activity  . Alcohol use: No  . Drug use: Never  . Sexual activity: Not Currently  Other Topics Concern  . Not on file  Social History Narrative  . Not on file   Social Determinants of Health   Financial Resource Strain:   . Difficulty of Paying Living Expenses:   Food Insecurity:   . Worried About Charity fundraiser in the Last Year:   . Arboriculturist in the Last Year:   Transportation Needs:   . Film/video editor (Medical):   Marland Kitchen Lack of Transportation (Non-Medical):   Physical Activity:   . Days of Exercise per Week:   . Minutes of Exercise per Session:   Stress:   . Feeling of Stress :   Social Connections:   . Frequency of Communication with Friends and  Family:   . Frequency of Social Gatherings with Friends and Family:   . Attends Religious Services:   . Active Member of Clubs or Organizations:   . Attends Archivist Meetings:   Marland Kitchen Marital Status:   Intimate Partner Violence:   . Fear of Current or Ex-Partner:   . Emotionally Abused:   Marland Kitchen Physically Abused:   . Sexually Abused:    Family History  Problem Relation Age of Onset  . Congestive Heart Failure Father   . Heart disease Father   . Stroke Brother   . Stroke Sister   . Diabetes Mellitus II Brother   . Diabetes Mellitus II Sister   . Lung cancer Brother   . Stroke Sister   . Stroke Brother   . Colon cancer Neg Hx   . Esophageal cancer Neg Hx   . Rectal cancer Neg Hx   . Stomach cancer Neg Hx     Objective: Office vital signs reviewed. BP 138/80   Pulse 77   Temp 99.1 F (37.3 C) (Temporal)   Ht 5\' 11"  (1.803 m)   Wt 247 lb (112 kg)   SpO2 95%   BMI 34.45 kg/m   Physical Examination:  General: Awake, alert, No acute distress GU: Moderate right-sided scrotal swelling noted.  Testicle without dominant masses but palpable fluid collection noted at the apex of the testicle.  This is tender to palpation.  Cremasteric reflex present.  Assessment/ Plan: 84 y.o. male   1. Scrotal swelling Highly suspicious for hydrocele.  Though fairly large on today's exam.  No discrete testicular masses palpated.  No evidence of torsion, though this was considered.  Empiric treatment w/ antibiotics today.  Scrotal ultrasound and referral to Urology.  Red flags discussed.  Patient voiced good understanding - Ambulatory referral to Urology - US SCROTUM W/DOPPLER; Future - cefTRIAXone (ROCEPHIN) injection 500 mg - doxycycline (VIBRA-TABS) 100 MG tablet; Take 1 tablet (100 mg total) by mouth 2 (two) times daily for 10 days.  Dispense: 20 tablet; Refill: 0  2. Hematuria, unspecified type - Urinalysis, Complete - Ambulatory referral to Urology - US SCROTUM W/DOPPLER; Future  - cefTRIAXone (ROCEPHIN) injection 500 mg - doxycycline (VIBRA-TABS) 100 MG tablet; Take 1 tablet (100 mg total) by mouth 2 (two) times daily for 10 days.  Dispense: 20 tablet; Refill: 0 - Urine Culture   Orders Placed This Encounter  Procedures  . US SCROTUM W/DOPPLER    Standing Status:   Future    Standing Expiration Date:  07/20/2020    Order Specific Question:   Reason for Exam (SYMPTOM  OR DIAGNOSIS REQUIRED)    Answer:   acute right sided scrotal swelling. no dominant mass.  ?hydrocele    Order Specific Question:   Preferred imaging location?    Answer:   Internal  . Urinalysis, Complete  . Ambulatory referral to Urology    Referral Priority:   Urgent    Referral Type:   Consultation    Referral Reason:   Specialty Services Required    Requested Specialty:   Urology    Number of Visits Requested:   1   No orders of the defined types were placed in this encounter.    Janora Norlander, DO [place] [phone]

## 2019-05-22 DIAGNOSIS — R310 Gross hematuria: Secondary | ICD-10-CM | POA: Diagnosis not present

## 2019-05-22 DIAGNOSIS — N452 Orchitis: Secondary | ICD-10-CM | POA: Diagnosis not present

## 2019-05-23 LAB — URINE CULTURE: Organism ID, Bacteria: NO GROWTH

## 2019-05-31 ENCOUNTER — Ambulatory Visit (INDEPENDENT_AMBULATORY_CARE_PROVIDER_SITE_OTHER): Payer: Medicare Other | Admitting: Orthopaedic Surgery

## 2019-05-31 ENCOUNTER — Encounter: Payer: Self-pay | Admitting: Orthopaedic Surgery

## 2019-05-31 ENCOUNTER — Other Ambulatory Visit: Payer: Self-pay

## 2019-05-31 VITALS — Ht 71.0 in | Wt 247.0 lb

## 2019-05-31 DIAGNOSIS — Z96652 Presence of left artificial knee joint: Secondary | ICD-10-CM

## 2019-05-31 DIAGNOSIS — M1711 Unilateral primary osteoarthritis, right knee: Secondary | ICD-10-CM | POA: Diagnosis not present

## 2019-05-31 NOTE — Progress Notes (Signed)
Office Visit Note   Patient: [name]           Date of Birth: 29-Jun-1935           MRN: [id number] Visit Date: 05/31/2019              Requested by: Claretta Fraise, MD [place],  [place] [address] PCP: Claretta Fraise, MD   Assessment & Plan: Visit Diagnoses:  1. Unilateral primary osteoarthritis, right knee   2. S/P total knee arthroplasty, left     Plan: Patient is going to continue to work on weight loss he like to proceed with right total knee arthroplasty in late May or June.  He has an appointment with Dr. Quinn Axe on May 3.  He may require additional preoperative testing for clearance.  Once this is done he can let us know and talk with the surgical scheduling.  Follow-Up Instructions: No follow-ups on file.   Orders:  No orders of the defined types were placed in this encounter.  No orders of the defined types were placed in this encounter.     Procedures: No procedures performed   Clinical Data: No additional findings.   Subjective: Chief Complaint  Patient presents with  . Right Shoulder - Follow-up    01/26/2019 Right RTC Repair    HPI 84 year old male returns post December biceps tenodesis shoulder debridement.  He had some biceps tendon tendinopathy with tenodesis performed and he had a large retracted rotator cuff tear could not be repaired.  He is able to reach his arm up over his head, dresses easily and washes his hair easily..  Patient is having increased problems with his right knee with osteoarthritis tricompartmental.  He has no effusion has problems walking pain wakes him up at night.  Left total knee arthroplasty is doing well and he states he wants to proceed with right total knee arthroplasty in late May or June after he sees Dr. Quinn Axe who is his PCP on May 3.  He knows he needs to lose some weight but states with his knee hurting swelling and stiffness it is hard for him to walk and exercise.  Review of Systems history of TIA type  2 diabetes, previous left knee arthroplasty 2018.  Has some lower extremity edema.  History of carotid stenosis.   Objective: Vital Signs: Ht 5\' 11"  (1.803 m)   Wt 247 lb (112 kg)   BMI 34.45 kg/m   Physical Exam Constitutional:      Appearance: He is well-developed.  HENT:     Head: Normocephalic and atraumatic.  Eyes:     Pupils: Pupils are equal, round, and reactive to light.  Neck:     Thyroid: No thyromegaly.     Trachea: No tracheal deviation.  Cardiovascular:     Rate and Rhythm: Normal rate.  Pulmonary:     Effort: Pulmonary effort is normal.     Breath sounds: No wheezing.  Abdominal:     General: Bowel sounds are normal.     Palpations: Abdomen is soft.  Skin:    General: Skin is warm and dry.     Capillary Refill: Capillary refill takes less than 2 seconds.  Neurological:     Mental Status: He is alert and oriented to person, place, and time.  Psychiatric:        Behavior: Behavior normal.        Thought Content: Thought content normal.  Judgment: Judgment normal.     Ortho Exam patient is amatory with a right knee limp.  He has 3+ knee effusion no increased warmth crepitus with range of motion.  He lacks 10 degrees reaching full extension he flexes 210 degrees.  Collateral ligaments are stable.  Distal pulses are present there is mild pitting edema which is slightly worse in the left lower extremity than right.  Specialty Comments:  No specialty comments available.  Imaging: No results found.   PMFS History: Patient Active Problem List   Diagnosis Date Noted  . Unilateral primary osteoarthritis, right knee 01/15/2016    Priority: Medium  . Rotator cuff impingement syndrome of right shoulder 01/26/2019  . Complete tear of right rotator cuff 01/19/2019  . Biceps tendinopathy of right upper extremity 01/19/2019  . Pain in right shoulder 12/06/2018  . TIA (transient ischemic attack) 12/05/2017  . Type 2 diabetes mellitus without complication  ([place]) [id number]  . 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 Diplopia 10/24/2017  . S/P total knee arthroplasty, left 04/29/2016  . Weakness of both arms 07/19/2012  . Colon cancer screening 06/22/2012  . Unspecified venous (peripheral) insufficiency 05/05/2012  . Chronic venous insufficiency 04/07/2012  . Other malaise and fatigue 03/16/2012  . Disturbance of skin sensation 03/16/2012  . Carotid stenosis 08/16/2011  . Obesity 08/16/2011  . Coronary artery disease excluded   . H/O mitral valve repair   . Endocarditis, valve unspecified, unspecified cause   . Dizziness 12/16/2010  . Dyslipidemia 09/23/2010  . Essential hypertension 04/06/2007  . Sleep apnea 04/06/2007  . DYSPNEA 04/06/2007   Past Medical History:  Diagnosis Date  . Arthritis    knees, shoulder,  hips (10/24/2017)  . Atrial fibrillation ([place])    on eliquis  . BPH (benign prostatic hyperplasia)   . Cataracts, bilateral    removed by surgery  . Coronary artery disease excluded   . Diabetes mellitus without complication ([place])    type 2  . Diverticulosis   . Endocarditis, valve unspecified, unspecified cause   . GERD (gastroesophageal reflux disease)   . H/O mitral valve repair    Postoperative ring with postoperative atrial fibrillation  . Hearing loss    both ears - does not use hearing aids  . Heart murmur    hx  . Hiatal hernia 06/13/2002  . High cholesterol   . History of kidney stones    passed spontaneously x2   . OSA on CPAP    uses cpap nightly  . Stroke ([place])   . Unspecified essential hypertension   . Weakness of both arms 07/19/2012  . Wears dentures    upper and lower partial    Family History  Problem Relation Age of Onset  . Congestive Heart Failure Father   . Heart disease Father   . Stroke Brother   . Stroke Sister   . Diabetes Mellitus II Brother   . Diabetes Mellitus II Sister   . Lung cancer Brother   . Stroke Sister   . Stroke Brother   . Colon cancer Neg Hx   . Esophageal  cancer Neg Hx   . Rectal cancer Neg Hx   . Stomach cancer Neg Hx     Past Surgical History:  Procedure Laterality Date  . CARDIAC CATHETERIZATION  02/14/2007   x 2  . CARDIOVERSION N/A 04/14/2016   Procedure: CARDIOVERSION;  Surgeon: Minus Breeding, MD;  Location: [facility];  Service: Cardiovascular;  Laterality: N/A;  . CATARACT EXTRACTION W/PHACO Right  05/12/2015   Procedure: CATARACT EXTRACTION PHACO AND INTRAOCULAR LENS PLACEMENT RIGHT EYE CDE=7.75;  Surgeon: Tonny Branch, MD;  Location: [facility];  Service: Ophthalmology;  Laterality: Right;  . CATARACT EXTRACTION W/PHACO Left 06/12/2015   Procedure: CATARACT EXTRACTION PHACO AND INTRAOCULAR LENS PLACEMENT (IOC);  Surgeon: Tonny Branch, MD;  Location: [facility];  Service: Ophthalmology;  Laterality: Left;  CDE:  13.17  . COLONOSCOPY    . INJECTION KNEE Right 02/20/2016   Procedure: KNEE INJECTION;  Surgeon: [name], MD;  Location: [facility];  Service: Orthopedics;  Laterality: Right;  . JOINT REPLACEMENT    . KNEE ARTHROSCOPY Left   . LAPAROSCOPIC CHOLECYSTECTOMY    . MITRAL VALVE REPAIR  ~ 2003  . MULTIPLE TOOTH EXTRACTIONS    . SHOULDER ARTHROSCOPY WITH ROTATOR CUFF REPAIR AND OPEN BICEPS TENODESIS Right 01/26/2019   Procedure: right shoulder arthroscopy, rotator cuff repair and biceps tenodesis;  Surgeon: [name], MD;  Location: [facility];  Service: Orthopedics;  Laterality: Right;  . SHOULDER OPEN ROTATOR CUFF REPAIR Left   . TOTAL KNEE ARTHROPLASTY Left 02/20/2016   Procedure: LEFT TOTAL KNEE ARTHROPLASTY WITH RIGHT KNEE INJECTION;  Surgeon: [name], MD;  Location: [facility];  Service: Orthopedics;  Laterality: Left;  . UPPER GI ENDOSCOPY    . VASECTOMY     Social History   Occupational History  . Occupation:  Environmental manager    Comment: RETIRED  Tobacco Use  . Smoking status: Former Smoker    Packs/day: 3.00    Years: 35.00    Pack years: 105.00    Types: Cigarettes    Quit date: 02/16/1988    Years since quitting: 31.3    . Smokeless tobacco: Never Used  . Tobacco comment:  Year Quit: 1990   Substance and Sexual Activity  . Alcohol use: No  . Drug use: Never  . Sexual activity: Not Currently

## 2019-06-08 DIAGNOSIS — N281 Cyst of kidney, acquired: Secondary | ICD-10-CM | POA: Diagnosis not present

## 2019-06-08 DIAGNOSIS — R310 Gross hematuria: Secondary | ICD-10-CM | POA: Diagnosis not present

## 2019-06-14 ENCOUNTER — Telehealth: Payer: Self-pay | Admitting: Cardiology

## 2019-06-14 NOTE — Telephone Encounter (Signed)
New message:    Patient calling stating that there will be a medical clearance form sent over today. Patient would like for some one to be aware.

## 2019-06-15 ENCOUNTER — Telehealth: Payer: Self-pay

## 2019-06-15 NOTE — Telephone Encounter (Signed)
   [facility] Pre-operative Risk Assessment    Request for surgical clearance:  1. What type of surgery is being performed? R total knee  2. When is this surgery scheduled? tbd  3. What type of clearance is required (medical clearance vs. Pharmacy clearance to hold med vs. Both)? both  4. Are there any medications that need to be held prior to surgery and how long? unknown  5. Practice name and name of physician performing surgery? [facility]-- Rodell Perna MD  6. What is your office phone number [phone]   7.   What is your office fax number [phone]  8.   Anesthesia type (None, local, MAC, general) ? spinal   Darlyn Chamber Naaman Curro 06/15/2019, 12:43 PM  _________________________________________________________________   (provider comments below)

## 2019-06-15 NOTE — Telephone Encounter (Signed)
Patient with diagnosis of afib on Eliquis for anticoagulation.    Procedure: TKA Date of procedure: TBD  CHADS2-VASc score of  7 (age x2, HTN, DM, CAD, stroke)   CrCl >126mL/min Platelet count 241  Typically hold Eliquis for 3 days prior to major surgery involving spinal anesthesia. Pt is high risk due to elevated CHADS2VASc score and history of CVA. Will defer to MD regarding appropriate length of anticoagulation hold.

## 2019-06-18 ENCOUNTER — Ambulatory Visit: Payer: Medicare Other | Admitting: Family Medicine

## 2019-06-18 NOTE — Telephone Encounter (Signed)
   Primary Cardiologist: Minus Breeding, MD  Chart reviewed as part of pre-operative protocol coverage. Patient was contacted 06/18/2019 in reference to pre-operative risk assessment for pending surgery as outlined below. He has a hx of persistent Afib, OSA, mitral valve repair, CVA and HTN.  [name] was last seen on 10/21 by Dr. Percival Spanish.  Since that day, [name] has done well from a cardiac standpoint. Actually underwent right shoulder arthroscopy with debridement with Dr. Lorin Mercy back in 12/20 without any complications and recovered well post op. Required to hold Elquis during that time period as well. He has done well since.   Therefore, based on ACC/AHA guidelines, the patient would be at acceptable risk for the planned procedure without further cardiovascular testing.   Per Dr. Percival Spanish- ok to hold Eliquis 3 days prior to procedure.   I will route this recommendation to the requesting party via Epic fax function and remove from pre-op pool.  Please call with questions.  [name] Bellis, NP 06/18/2019, 9:51 AM

## 2019-06-18 NOTE — Telephone Encounter (Signed)
OK to hold Eliquis for 3 days prior to procedure.

## 2019-06-19 NOTE — Telephone Encounter (Signed)
Clearance completed on 4/30 and patient notified on 5/3 per pre-op note.

## 2019-06-27 ENCOUNTER — Encounter: Payer: Self-pay | Admitting: Family Medicine

## 2019-06-27 ENCOUNTER — Other Ambulatory Visit: Payer: Self-pay

## 2019-06-27 ENCOUNTER — Ambulatory Visit (INDEPENDENT_AMBULATORY_CARE_PROVIDER_SITE_OTHER): Payer: Medicare Other | Admitting: Family Medicine

## 2019-06-27 VITALS — BP 137/80 | HR 69 | Temp 97.5°F | Resp 20 | Ht 71.0 in | Wt 242.2 lb

## 2019-06-27 DIAGNOSIS — E785 Hyperlipidemia, unspecified: Secondary | ICD-10-CM | POA: Diagnosis not present

## 2019-06-27 DIAGNOSIS — I10 Essential (primary) hypertension: Secondary | ICD-10-CM

## 2019-06-27 DIAGNOSIS — E119 Type 2 diabetes mellitus without complications: Secondary | ICD-10-CM

## 2019-06-27 LAB — BAYER DCA HB A1C WAIVED: HB A1C (BAYER DCA - WAIVED): 6.5 % (ref <7.0)

## 2019-06-27 NOTE — Progress Notes (Signed)
Subjective:  Patient ID: [name], male    DOB: 1936-01-31  Age: 84 y.o. MRN: [id number]  CC: Medical Management of Chronic Issues   HPI [name] presents forFollow-up of diabetes.  Patient does not check his blood sugar.  Patient denies symptoms such as polyuria, polydipsia, excessive hunger, nausea No significant hypoglycemic spells noted. Medications reviewed. Pt reports taking them regularly without complication/adverse reaction being reported today.  Checking feet daily.   Patient in for follow-up of elevated cholesterol. Doing well without complaints on current medication. Denies side effects of statin including myalgia and arthralgia and nausea. Also in today for liver function testing. Currently no chest pain, shortness of breath or other cardiovascular related symptoms noted.   Follow-up of hypertension. Patient has no history of headache chest pain or shortness of breath or recent cough. Patient also denies symptoms of TIA such as numbness weakness lateralizing. Patient checks  blood pressure at home and has not had any elevated readings recently. Patient denies side effects from his medication. States taking it regularly. Patient also had some hematuria recently has been seeing Dr. Tammi [name] of urology.  A CT was performed.  He does not know the outcome.  He is also scheduled for follow-up with urology on May 18.  History [name] has a past medical history of Arthritis, Atrial fibrillation ([place]), BPH (benign prostatic hyperplasia), Cataracts, bilateral, Coronary artery disease excluded, Diabetes mellitus without complication ([place]), Diverticulosis, Endocarditis, valve unspecified, unspecified cause, GERD (gastroesophageal reflux disease), H/O mitral valve repair, Hearing loss, Heart murmur, Hiatal hernia (06/13/2002), High cholesterol, History of kidney stones, OSA on CPAP, Stroke ([place]), Unspecified essential hypertension, Weakness of both arms (07/19/2012), and Wears  dentures.   He has a past surgical history that includes Vasectomy; Mitral valve repair (~ 2003); Cataract extraction w/PHACO (Right, 05/12/2015); Cataract extraction w/PHACO (Left, 06/12/2015); Total knee arthroplasty (Left, 02/20/2016); Injection knee (Right, 02/20/2016); Cardioversion (N/A, 04/14/2016); Laparoscopic cholecystectomy; Knee arthroscopy (Left); Joint replacement; Shoulder open rotator cuff repair (Left); Colonoscopy; Upper gi endoscopy; Multiple tooth extractions; Cardiac catheterization (02/14/2007); and Shoulder arthroscopy with rotator cuff repair and open biceps tenodesis (Right, 01/26/2019).   His family history includes Congestive Heart Failure in his father; Diabetes Mellitus II in his brother and sister; Heart disease in his father; Lung cancer in his brother; Stroke in his brother, brother, sister, and sister.He reports that he quit smoking about 31 years ago. His smoking use included cigarettes. He has a 105.00 pack-year smoking history. He has never used smokeless tobacco. He reports that he does not drink alcohol or use drugs.  Current Outpatient Medications on File Prior to Visit  Medication Sig Dispense Refill  . amLODipine (NORVASC) 5 MG tablet Take 1 tablet (5 mg total) by mouth daily. 90 tablet 1  . apixaban (ELIQUIS) 5 MG TABS tablet Take 1 tablet (5 mg total) by mouth 2 (two) times daily. 180 tablet 1  . Calcium Carb-Cholecalciferol (CALCIUM 600 + D PO) Take 1 tablet by mouth daily.    . Coenzyme Q10 300 MG CAPS Take 300 mg by mouth daily.    Marland Kitchen lisinopril (ZESTRIL) 20 MG tablet Take 1 tablet (20 mg total) by mouth at bedtime. 90 tablet 1  . metFORMIN (GLUCOPHAGE-XR) 500 MG 24 hr tablet TAKE 1 TABLET BY MOUTH EVERY DAY WITH BREAKFAST (Patient taking differently: Take 500 mg by mouth daily. TAKE 1 TABLET BY MOUTH EVERY DAY WITH BREAKFAST) 90 tablet 1  . Multiple Vitamin (MULTIVITAMIN WITH MINERALS) TABS tablet Take 1 tablet  by mouth daily.    Marland Kitchen omeprazole (PRILOSEC) 20 MG  capsule Take 1 capsule (20 mg total) by mouth daily. (Patient taking differently: Take 20 mg by mouth at bedtime. ) 90 capsule 1  . rosuvastatin (CRESTOR) 5 MG tablet Take 1 tablet (5 mg total) by mouth daily. For cholesterol (Patient taking differently: Take 5 mg by mouth every evening. For cholesterol) 90 tablet 3   No current facility-administered medications on file prior to visit.    ROS Review of Systems  Constitutional: Negative for fever.  Respiratory: Negative for shortness of breath.   Cardiovascular: Negative for chest pain.  Musculoskeletal: Negative for arthralgias.  Skin: Negative for rash.    Objective:  BP 137/80   Pulse 69   Temp (!) 97.5 F (36.4 C) (Temporal)   Resp 20   Ht _0  (1.803 m)   Wt 242 lb 4 oz (109.9 kg)   SpO2 99%   BMI 33.79 kg/m   BP Readings from Last 3 Encounters:  06/27/19 137/80  05/21/19 138/80  05/03/19 (!) 142/78    Wt Readings from Last 3 Encounters:  06/27/19 242 lb 4 oz (109.9 kg)  05/31/19 247 lb (112 kg)  05/21/19 247 lb (112 kg)     Physical Exam Constitutional:      General: He is not in acute distress.    Appearance: He is well-developed.  HENT:     Head: Normocephalic and atraumatic.     Right Ear: External ear normal.     Left Ear: External ear normal.     Nose: Nose normal.  Eyes:     Conjunctiva/sclera: Conjunctivae normal.     Pupils: Pupils are equal, round, and reactive to light.  Cardiovascular:     Rate and Rhythm: Normal rate and regular rhythm.     Heart sounds: Normal heart sounds. No murmur.  Pulmonary:     Effort: Pulmonary effort is normal. No respiratory distress.     Breath sounds: Normal breath sounds. No wheezing or rales.  Abdominal:     Palpations: Abdomen is soft.     Tenderness: There is no abdominal tenderness.  Musculoskeletal:        General: Normal range of motion.     Cervical back: Normal range of motion and neck supple.  Skin:    General: Skin is warm and dry.    Neurological:     Mental Status: He is alert and oriented to person, place, and time.     Deep Tendon Reflexes: Reflexes are normal and symmetric.  Psychiatric:        Behavior: Behavior normal.        Thought Content: Thought content normal.        Judgment: Judgment normal.     Diabetic Foot Exam - Simple   Simple Foot Form Diabetic Foot exam was performed with the following findings: Yes 06/27/2019 10:15 AM  Visual Inspection No deformities, no ulcerations, no other skin breakdown bilaterally: Yes Sensation Testing Intact to touch and monofilament testing bilaterally: Yes Pulse Check Posterior Tibialis and Dorsalis pulse intact bilaterally: Yes Comments      Assessment & Plan:   Tymarion was seen today for medical management of chronic issues.  Diagnoses and all orders for this visit:  Type 2 diabetes mellitus without complication, without long-term current use of insulin (HCC) -     CBC with Differential/Platelet -     CMP14+EGFR -     Bayer DCA Hb A1c Waived  Essential  hypertension -     CBC with Differential/Platelet -     CMP14+EGFR  Dyslipidemia -     CBC with Differential/Platelet -     CMP14+EGFR -     Lipid panel   Allergies as of 06/27/2019      Reactions   Flomax [tamsulosin Hcl] Other (See Comments)   Makes him "swimmy headed"      Medication List       Accurate as of Jun 27, 2019  5:47 PM. If you have any questions, ask your nurse or doctor.        amLODipine 5 MG tablet Commonly known as: NORVASC Take 1 tablet (5 mg total) by mouth daily.   apixaban 5 MG Tabs tablet Commonly known as: Eliquis Take 1 tablet (5 mg total) by mouth 2 (two) times daily.   CALCIUM 600 + D PO Take 1 tablet by mouth daily.   Coenzyme Q10 300 MG Caps Take 300 mg by mouth daily.   lisinopril 20 MG tablet Commonly known as: ZESTRIL Take 1 tablet (20 mg total) by mouth at bedtime.   metFORMIN 500 MG 24 hr tablet Commonly known as: GLUCOPHAGE-XR TAKE 1  TABLET BY MOUTH EVERY DAY WITH BREAKFAST What changed:   how much to take  how to take this  when to take this   multivitamin with minerals Tabs tablet Take 1 tablet by mouth daily.   omeprazole 20 MG capsule Commonly known as: PRILOSEC Take 1 capsule (20 mg total) by mouth daily. What changed: when to take this   rosuvastatin 5 MG tablet Commonly known as: Crestor Take 1 tablet (5 mg total) by mouth daily. For cholesterol What changed: when to take this          I am having Jud A. Rowe Robert maintain his multivitamin with minerals, Calcium Carb-Cholecalciferol (CALCIUM 600 + D PO), Coenzyme Q10, omeprazole, metFORMIN, lisinopril, apixaban, amLODipine, and rosuvastatin.  No orders of the defined types were placed in this encounter.    Follow-up: No follow-ups on file.  Claretta Fraise, M.D.

## 2019-06-28 ENCOUNTER — Ambulatory Visit (INDEPENDENT_AMBULATORY_CARE_PROVIDER_SITE_OTHER): Payer: Medicare Other | Admitting: Surgery

## 2019-06-28 ENCOUNTER — Telehealth: Payer: Self-pay | Admitting: *Deleted

## 2019-06-28 ENCOUNTER — Encounter: Payer: Self-pay | Admitting: Surgery

## 2019-06-28 VITALS — BP 120/75 | HR 47 | Ht 71.0 in | Wt 242.2 lb

## 2019-06-28 DIAGNOSIS — M1711 Unilateral primary osteoarthritis, right knee: Secondary | ICD-10-CM

## 2019-06-28 LAB — CMP14+EGFR
ALT: 15 IU/L (ref 0–44)
AST: 13 IU/L (ref 0–40)
Albumin/Globulin Ratio: 1.8 (ref 1.2–2.2)
Albumin: 4.3 g/dL (ref 3.6–4.6)
Alkaline Phosphatase: 75 IU/L (ref 39–117)
BUN/Creatinine Ratio: 18 (ref 10–24)
BUN: 14 mg/dL (ref 8–27)
Bilirubin Total: 0.8 mg/dL (ref 0.0–1.2)
CO2: 22 mmol/L (ref 20–29)
Calcium: 8.9 mg/dL (ref 8.6–10.2)
Chloride: 104 mmol/L (ref 96–106)
Creatinine, Ser: 0.79 mg/dL (ref 0.76–1.27)
GFR calc Af Amer: 97 mL/min/{1.73_m2} (ref >59)
GFR calc non Af Amer: 84 mL/min/{1.73_m2} (ref >59)
Globulin, Total: 2.4 g/dL (ref 1.5–4.5)
Glucose: 144 mg/dL — ABNORMAL HIGH (ref 65–99)
Potassium: 4.1 mmol/L (ref 3.5–5.2)
Sodium: 142 mmol/L (ref 134–144)
Total Protein: 6.7 g/dL (ref 6.0–8.5)

## 2019-06-28 LAB — CBC WITH DIFFERENTIAL/PLATELET
Basophils Absolute: 0 10*3/uL (ref 0.0–0.2)
Basos: 0 %
EOS (ABSOLUTE): 0.1 10*3/uL (ref 0.0–0.4)
Eos: 2 %
Hematocrit: 42.5 % (ref 37.5–51.0)
Hemoglobin: 14.4 g/dL (ref 13.0–17.7)
Immature Grans (Abs): 0 10*3/uL (ref 0.0–0.1)
Immature Granulocytes: 0 %
Lymphocytes Absolute: 1.5 10*3/uL (ref 0.7–3.1)
Lymphs: 21 %
MCH: 32.7 pg (ref 26.6–33.0)
MCHC: 33.9 g/dL (ref 31.5–35.7)
MCV: 97 fL (ref 79–97)
Monocytes Absolute: 0.6 10*3/uL (ref 0.1–0.9)
Monocytes: 9 %
Neutrophils Absolute: 4.6 10*3/uL (ref 1.4–7.0)
Neutrophils: 68 %
Platelets: 195 10*3/uL (ref 150–450)
RBC: 4.4 x10E6/uL (ref 4.14–5.80)
RDW: 13 % (ref 11.6–15.4)
WBC: 6.9 10*3/uL (ref 3.4–10.8)

## 2019-06-28 LAB — LIPID PANEL
Chol/HDL Ratio: 2.3 ratio (ref 0.0–5.0)
Cholesterol, Total: 98 mg/dL — ABNORMAL LOW (ref 100–199)
HDL: 42 mg/dL (ref >39)
LDL Chol Calc (NIH): 30 mg/dL (ref 0–99)
Triglycerides: 156 mg/dL — ABNORMAL HIGH (ref 0–149)
VLDL Cholesterol Cal: 26 mg/dL (ref 5–40)

## 2019-06-28 NOTE — Telephone Encounter (Signed)
Ortho bundle pre-op call completed. 

## 2019-06-28 NOTE — Progress Notes (Signed)
84 year old white male history of end-stage DJD right knee and pain comes in for preop evaluation.  Symptoms unchanged from previous visit.  He is wanting to proceed with right total knee replacement scheduled.  We have received preop cardiac clearance.  History and physical performed.  Review of systems negative.  Surgical procedure discussed all potential rehab/recovery time.  All questions answered.

## 2019-06-28 NOTE — Care Plan (Signed)
RNCM met with patient and his wife in office today during his H&P appointment with Benjiman Core, PA-C. Patient is scheduled for a Right TKA on 07/06/19 with Dr. Lorin Mercy. Discussed that he is an Ortho bundle patient through [facility]. He is agreeable to case management. He has a wife that will be with him after discharge home. He has a walker and does not need an elevated toilet seat he informed. Anticipate HHPT after short hospital stay. Choice provided and will make referral to [facility]. Also noted that patient attended [facility] in [place] for [place] previously. Will make referral for therapy to begin at 2 weeks post op. Will continue to follow for CM needs.

## 2019-06-29 NOTE — Progress Notes (Signed)
[id number] - [place], [place] [address] [place] [address] Phone: [phone] Fax: [phone]      Your procedure is scheduled on Friday, Jul 06, 2019.  Report to [facility] Main Entrance "A" at 5:30 A.M., and check in at the Admitting office.  Call this number if you have problems the morning of surgery:  [phone]  Call [phone] if you have any questions prior to your surgery date Monday-Friday 8am-4pm    Remember:  Do not eat after midnight the night before your surgery  You may drink clear liquids until 4:30 AM the morning of your surgery.   Clear liquids allowed are: Water, Non-Citrus Juices (without pulp), Carbonated Beverages, Clear Tea, Black Coffee Only, and Gatorade   Enhanced Recovery after Surgery for Orthopedics Enhanced Recovery after Surgery is a protocol used to improve the stress on your body and your recovery after surgery.  Patient Instructions  . The night before surgery:  o No food after midnight. ONLY clear liquids after midnight  . The day of surgery (if you have diabetes): o  o Drink ONE (1) Gatorade 2 (G2) as directed. o This drink was given to you during your hospital  pre-op appointment visit.  o The pre-op nurse will instruct you on the time to drink the   Gatorade 2 (G2) depending on your surgery time. 4:30 AM o Color of the Gatorade may vary. Red is not allowed. o Nothing else to drink after completing the  Gatorade 2 (G2).         If you have questions, please contact your surgeon's office.     Take these medicines the morning of surgery with A SIP OF WATER:  amLODipine (NORVASC)  Follow your surgeon's instructions on when to stop Eliquis.  If no instructions were given by your surgeon then you will need to call the office to get those instructions.    As of today, STOP taking any Aspirin (unless otherwise instructed by your surgeon) and Aspirin containing products, Aleve, Naproxen,  Ibuprofen, Motrin, Advil, Goody's, BC's, all herbal medications, fish oil, and all vitamins.    WHAT DO I DO ABOUT MY DIABETES MEDICATION?   Marland Kitchen Do not take oral diabetes medicines (metFORMIN (GLUCOPHAGE-XR) ) the morning of surgery.   HOW TO MANAGE YOUR DIABETES BEFORE AND AFTER SURGERY  Why is it important to control my blood sugar before and after surgery? . Improving blood sugar levels before and after surgery helps healing and can limit problems. . A way of improving blood sugar control is eating a healthy diet by: o  Eating less sugar and carbohydrates o  Increasing activity/exercise o  Talking with your doctor about reaching your blood sugar goals . High blood sugars (greater than 180 mg/dL) can raise your risk of infections and slow your recovery, so you will need to focus on controlling your diabetes during the weeks before surgery. . Make sure that the doctor who takes care of your diabetes knows about your planned surgery including the date and location.  How do I manage my blood sugar before surgery? . Check your blood sugar at least 4 times a day, starting 2 days before surgery, to make sure that the level is not too high or low. . Check your blood sugar the morning of your surgery when you wake up and every 2 hours until you get to the Short Stay unit. o If your blood sugar is less than 70 mg/dL, you  will need to treat for low blood sugar: - Do not take insulin. - Treat a low blood sugar (less than 70 mg/dL) with  cup of clear juice (cranberry or apple), 4 glucose tablets, OR glucose gel. - Recheck blood sugar in 15 minutes after treatment (to make sure it is greater than 70 mg/dL). If your blood sugar is not greater than 70 mg/dL on recheck, call [phone] for further instructions. . Report your blood sugar to the short stay nurse when you get to Short Stay.  . If you are admitted to the hospital after surgery: o Your blood sugar will be checked by the staff and you  will probably be given insulin after surgery (instead of oral diabetes medicines) to make sure you have good blood sugar levels. o The goal for blood sugar control after surgery is 80-180 mg/dL.                     Do not wear jewelry.            Do not wear lotions, powders, colognes, or deodorant.            Men may shave face and neck.            Do not bring valuables to the hospital.            [facility] is not responsible for any belongings or valuables.  Do NOT Smoke (Tobacco/Vapping) or drink Alcohol 24 hours prior to your procedure If you use a CPAP at night, you may bring all equipment for your overnight stay.   Contacts, glasses, dentures or bridgework may not be worn into surgery.      For patients admitted to the hospital, discharge time will be determined by your treatment team.   Patients discharged the day of surgery will not be allowed to drive home, and someone needs to stay with them for 24 hours.    Special instructions:   [place]- Preparing For Surgery  Before surgery, you can play an important role. Because skin is not sterile, your skin needs to be as free of germs as possible. You can reduce the number of germs on your skin by washing with CHG (chlorahexidine gluconate) Soap before surgery.  CHG is an antiseptic cleaner which kills germs and bonds with the skin to continue killing germs even after washing.    Oral Hygiene is also important to reduce your risk of infection.  Remember - BRUSH YOUR TEETH THE MORNING OF SURGERY WITH YOUR REGULAR TOOTHPASTE  Please do not use if you have an allergy to CHG or antibacterial soaps. If your skin becomes reddened/irritated stop using the CHG.  Do not shave (including legs and underarms) for at least 48 hours prior to first CHG shower. It is OK to shave your face.  Please follow these instructions carefully.   1. Shower the NIGHT BEFORE SURGERY and the MORNING OF SURGERY with CHG Soap.   2. If you chose to wash  your hair, wash your hair first as usual with your normal shampoo.  3. After you shampoo, rinse your hair and body thoroughly to remove the shampoo.  4. Use CHG as you would any other liquid soap. You can apply CHG directly to the skin and wash gently with a scrungie or a clean washcloth.   5. Apply the CHG Soap to your body ONLY FROM THE NECK DOWN.  Do not use on open wounds or open sores. Avoid  contact with your eyes, ears, mouth and genitals (private parts). Wash Face and genitals (private parts)  with your normal soap.   6. Wash thoroughly, paying special attention to the area where your surgery will be performed.  7. Thoroughly rinse your body with warm water from the neck down.  8. DO NOT shower/wash with your normal soap after using and rinsing off the CHG Soap.  9. Pat yourself dry with a CLEAN TOWEL.  10. Wear CLEAN PAJAMAS to bed the night before surgery, wear comfortable clothes the morning of surgery  11. Place CLEAN SHEETS on your bed the night of your first shower and DO NOT SLEEP WITH PETS.   Day of Surgery:   Do not apply any deodorants/lotions.  Please wear clean clothes to the hospital/surgery center.   Remember to brush your teeth WITH YOUR REGULAR TOOTHPASTE.   Please read over the following fact sheets that you were given.

## 2019-07-02 ENCOUNTER — Encounter (HOSPITAL_COMMUNITY)
Admission: RE | Admit: 2019-07-02 | Discharge: 2019-07-02 | Disposition: A | Discharge status: Home / Self Care | Payer: Medicare Other | Source: Ambulatory Visit | Attending: Orthopaedic Surgery | Admitting: Orthopaedic Surgery

## 2019-07-02 ENCOUNTER — Ambulatory Visit (HOSPITAL_COMMUNITY)
Admission: RE | Admit: 2019-07-02 | Discharge: 2019-07-02 | Disposition: A | Discharge status: Home / Self Care | Payer: Medicare Other | Source: Ambulatory Visit | Attending: Surgery | Admitting: Surgery

## 2019-07-02 ENCOUNTER — Other Ambulatory Visit: Payer: Self-pay

## 2019-07-02 ENCOUNTER — Encounter (HOSPITAL_COMMUNITY): Payer: Self-pay

## 2019-07-02 DIAGNOSIS — Z9842 Cataract extraction status, left eye: Secondary | ICD-10-CM | POA: Diagnosis not present

## 2019-07-02 DIAGNOSIS — Z8673 Personal history of transient ischemic attack (TIA), and cerebral infarction without residual deficits: Secondary | ICD-10-CM | POA: Insufficient documentation

## 2019-07-02 DIAGNOSIS — H9190 Unspecified hearing loss, unspecified ear: Secondary | ICD-10-CM | POA: Insufficient documentation

## 2019-07-02 DIAGNOSIS — Z87891 Personal history of nicotine dependence: Secondary | ICD-10-CM | POA: Insufficient documentation

## 2019-07-02 DIAGNOSIS — Z7984 Long term (current) use of oral hypoglycemic drugs: Secondary | ICD-10-CM | POA: Insufficient documentation

## 2019-07-02 DIAGNOSIS — I10 Essential (primary) hypertension: Secondary | ICD-10-CM | POA: Insufficient documentation

## 2019-07-02 DIAGNOSIS — I4891 Unspecified atrial fibrillation: Secondary | ICD-10-CM | POA: Insufficient documentation

## 2019-07-02 DIAGNOSIS — Z01818 Encounter for other preprocedural examination: Secondary | ICD-10-CM

## 2019-07-02 DIAGNOSIS — M1711 Unilateral primary osteoarthritis, right knee: Secondary | ICD-10-CM | POA: Diagnosis not present

## 2019-07-02 DIAGNOSIS — Z9841 Cataract extraction status, right eye: Secondary | ICD-10-CM | POA: Diagnosis not present

## 2019-07-02 DIAGNOSIS — G4733 Obstructive sleep apnea (adult) (pediatric): Secondary | ICD-10-CM | POA: Diagnosis not present

## 2019-07-02 DIAGNOSIS — K219 Gastro-esophageal reflux disease without esophagitis: Secondary | ICD-10-CM | POA: Insufficient documentation

## 2019-07-02 DIAGNOSIS — Z961 Presence of intraocular lens: Secondary | ICD-10-CM | POA: Diagnosis not present

## 2019-07-02 DIAGNOSIS — Z7901 Long term (current) use of anticoagulants: Secondary | ICD-10-CM | POA: Diagnosis not present

## 2019-07-02 DIAGNOSIS — Z96652 Presence of left artificial knee joint: Secondary | ICD-10-CM | POA: Insufficient documentation

## 2019-07-02 DIAGNOSIS — Z79899 Other long term (current) drug therapy: Secondary | ICD-10-CM | POA: Insufficient documentation

## 2019-07-02 DIAGNOSIS — J984 Other disorders of lung: Secondary | ICD-10-CM | POA: Diagnosis not present

## 2019-07-02 DIAGNOSIS — E119 Type 2 diabetes mellitus without complications: Secondary | ICD-10-CM | POA: Insufficient documentation

## 2019-07-02 LAB — SURGICAL PCR SCREEN
MRSA, PCR: NEGATIVE
Staphylococcus aureus: NEGATIVE

## 2019-07-02 LAB — GLUCOSE, CAPILLARY: Glucose-Capillary: 135 mg/dL — ABNORMAL HIGH (ref 70–99)

## 2019-07-02 LAB — URINALYSIS, ROUTINE W REFLEX MICROSCOPIC
Bilirubin Urine: NEGATIVE
Glucose, UA: NEGATIVE mg/dL
Hgb urine dipstick: NEGATIVE
Ketones, ur: NEGATIVE mg/dL
Leukocytes,Ua: NEGATIVE
Nitrite: NEGATIVE
Protein, ur: NEGATIVE mg/dL
Specific Gravity, Urine: 1.02 (ref 1.005–1.030)
pH: 6 (ref 5.0–8.0)

## 2019-07-02 NOTE — Progress Notes (Signed)
Hello [name],  Your lab result is normal and/or stable.Some minor variations that are not significant are commonly marked abnormal, but do not represent any medical problem for you.  Best regards, Matia Zelada, M.D.

## 2019-07-02 NOTE — Progress Notes (Signed)
PCP - Dr. Quinn Axe  Cardiologist - Dr. Waneta Martins.- 06/15/19 (E)  Chest x-ray - 07/02/19  EKG - 12/01/2018 (E)  Stress Test - 10/29/10 (E)  ECHO - 10/24/17 (E)  Cardiac Cath - 02/14/07 (E)  AICD-na PM-na LOOP-na  Sleep Study - Yes- Positive CPAP - Yes- pt will bring machine and equipment dos  LABS- 06/27/19: CBC w/D, CMP 07/02/19: UA, PCR 07/03/19: COVID 07/06/19: PT/INR  ASA- Denies Eliquis- LD- 5/17  ERAS- Yes- 1 G2 drink given  HA1C- 06/27/19: 6.5 (E) Fasting Blood Sugar - 135 Checks Blood Sugar ___0__ times a day  Anesthesia- Yes- previous consult from 01/25/2019- [place]  Pt denies having chest pain, sob, or fever at this time. All instructions explained to the pt, with a verbal understanding of the material. Pt agrees to go over the instructions while at home for a better understanding. Pt also instructed to self quarantine after being tested for COVID-19. The opportunity to ask questions was provided.   Coronavirus Screening  Have you experienced the following symptoms:  Cough yes/no: No Fever (>100.1F)  yes/no: No Runny nose yes/no: No Sore throat yes/no: No Difficulty breathing/shortness of breath  yes/no: No  Have you or a family member traveled in the last 14 days and where? yes/no: No   If the patient indicates "YES" to the above questions, their PAT will be rescheduled to limit the exposure to others and, the surgeon will be notified. THE PATIENT WILL NEED TO BE ASYMPTOMATIC FOR 14 DAYS.   If the patient is not experiencing any of these symptoms, the PAT nurse will instruct them to NOT bring anyone with them to their appointment since they may have these symptoms or traveled as well.   Please remind your patients and families that hospital visitation restrictions are in effect and the importance of the restrictions.

## 2019-07-03 ENCOUNTER — Other Ambulatory Visit (HOSPITAL_COMMUNITY)
Admission: RE | Admit: 2019-07-03 | Discharge: 2019-07-03 | Disposition: A | Discharge status: Home / Self Care | Payer: Medicare Other | Source: Ambulatory Visit | Attending: Orthopaedic Surgery | Admitting: Orthopaedic Surgery

## 2019-07-03 ENCOUNTER — Other Ambulatory Visit: Payer: Self-pay

## 2019-07-03 DIAGNOSIS — N281 Cyst of kidney, acquired: Secondary | ICD-10-CM | POA: Diagnosis not present

## 2019-07-03 DIAGNOSIS — R310 Gross hematuria: Secondary | ICD-10-CM | POA: Diagnosis not present

## 2019-07-03 DIAGNOSIS — Z01812 Encounter for preprocedural laboratory examination: Secondary | ICD-10-CM | POA: Insufficient documentation

## 2019-07-03 DIAGNOSIS — Z20822 Contact with and (suspected) exposure to covid-19: Secondary | ICD-10-CM | POA: Diagnosis not present

## 2019-07-03 DIAGNOSIS — N452 Orchitis: Secondary | ICD-10-CM | POA: Diagnosis not present

## 2019-07-03 LAB — SARS CORONAVIRUS 2 (TAT 6-24 HRS): SARS Coronavirus 2: NEGATIVE

## 2019-07-03 NOTE — Anesthesia Preprocedure Evaluation (Addendum)
Anesthesia Evaluation  Patient identified by MRN, date of birth, ID band Patient awake    Reviewed: Allergy & Precautions, NPO status , Patient's Chart, lab work & pertinent test results  Airway Mallampati: II  TM Distance: >3 FB Neck ROM: Full    Dental  (+) Edentulous Upper, Partial Lower,    Pulmonary sleep apnea and Continuous Positive Airway Pressure Ventilation , former smoker,  Quit smoking 1990, 105 pack year history    Pulmonary exam normal breath sounds clear to auscultation       Cardiovascular hypertension, Pt. on medications Normal cardiovascular exam+ dysrhythmias Atrial Fibrillation + Valvular Problems/Murmurs (mild MR) MR  Rhythm:Regular Rate:Normal  S/p MV repair 2003, postop Afib   Follows with cardiology regularly, no issues  Last echo 2019: - Left ventricle: The cavity size was normal. There was mild  concentric hypertrophy. Systolic function was normal. The  estimated ejection fraction was in the range of 60% to 65%. Wall  motion was normal; there were no regional wall motion  abnormalities.  - Aortic valve: Transvalvular velocity was within the normal range.  There was no stenosis. There was no regurgitation.  - Mitral valve: Transvalvular velocity was within the normal range.  There was no evidence for stenosis. There was mild regurgitation.  Valve area by continuity equation (using LVOT flow): 0.9 cm^2.  - Left atrium: The atrium was severely dilated.  - Right ventricle: The cavity size was normal. Wall thickness was  normal. Systolic function was normal.  - Tricuspid valve: There was trivial regurgitation.  - Pulmonary arteries: Systolic pressure was mildly increased. PA  peak pressure: 41 mm Hg (S).   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 incidentally CVA, No Residual Symptoms negative psych ROS   GI/Hepatic Neg liver ROS, hiatal hernia, GERD   Controlled and Medicated,  Endo/Other  diabetes, Well Controlled, Type 2, Oral Hypoglycemic AgentsObesity BMI 33 Last a1c 6.5  Renal/GU negative Renal ROS  negative genitourinary   Musculoskeletal  (+) Arthritis , Osteoarthritis,  R knee OA   Abdominal (+) + obese,   Peds negative pediatric ROS (+)  Hematology negative hematology ROS (+)   Anesthesia Other Findings   Reproductive/Obstetrics negative OB ROS                            Anesthesia Physical Anesthesia Plan  ASA: III  Anesthesia Plan: Spinal, MAC and Regional   Post-op Pain Management:  Regional for Post-op pain   Induction:   PONV Risk Score and Plan: 2 and Propofol infusion and TIVA  Airway Management Planned: Natural Airway and Nasal Cannula  Additional Equipment: None  Intra-op Plan:   Post-operative Plan:   Informed Consent: I have reviewed the patients History and Physical, chart, labs and discussed the procedure including the risks, benefits and alternatives for the proposed anesthesia with the patient or authorized representative who has indicated his/her understanding and acceptance.       Plan Discussed with: CRNA  Anesthesia Plan Comments:        Anesthesia Quick Evaluation

## 2019-07-03 NOTE — Progress Notes (Signed)
Anesthesia Chart Review:  Case: [id number] Date/Time: 07/06/19 0715   Procedure: RIGHT TOTAL KNEE ARTHROPLASTY (Right Knee)   Anesthesia type: Spinal   Pre-op diagnosis: right knee osteoarthritis   Location: [facility] OR ROOM 06 / [place] [facility]   Surgeons: [name], MD      DISCUSSION: Patient is an 84 year old male scheduled for the above procedure.  History includes former smoker (quit 1990), endocarditis/murmur/severe MR (s/p DaVinci minimally invasive mitral valve repair with a trapezoidal resection of P2, chordal transfer to P1, and insertion of a #34 Cosgrove annuloplasty band 11/02/01, [facility]), afib (on Eliquis),  HTN,  OSA (CPAP), DM2, GERD, hiatal hernia, hearing loss (hearing aids), CVA (small remote left cerebellar infarct 10/24/17 MRI), hypercholesterolemia, BPH. S/p right rotator cuff repair 01/26/19. Only mild luminal irregularities noted on coronary angiography in 2008.  BMI is consistent with obesity.  Preoperative cardiology input outlined by Reino Bellis, NP on 06/18/19:  ".Marland KitchenMarland KitchenRichard Fran [name] was last seen on 10/21 by Dr. Percival Spanish.  Since that day, [name] has done well from a cardiac standpoint. Actually underwent right shoulder arthroscopy with debridement with Dr. Lorin Mercy back in 12/20 without any complications and recovered well post op. Required to hold Elquis during that time period as well. He has done well since.   Therefore, based on ACC/AHA guidelines, the patient would be at acceptable risk for the planned procedure without further cardiovascular testing.   Per Dr. Percival Spanish- ok to hold Eliquis 3 days prior to procedure." He reported last Eliquis 06/1719.  5//18/21 preoperative COVID-19 test is in process. PT/INR is scheduled for day of surgery. Anesthesia team evaluation on the day of surgery.   VS: BP 136/71   Pulse 75   Temp 36.6 C (Oral)   Resp 18   Wt 110.5 kg   SpO2 97%   BMI 33.99 kg/m    PROVIDERS: Claretta Fraise, MD is PCP. Last visit  06/27/19. Minus Breeding, MD is cardiologist. Last evaluation 12/01/18. Shelva Majestic, MD is cardiologist for Sleep Medicine. Last evaluation 05/03/19.  Alexis Frock, MD is urologist   LABS: He had labs through Claretta Fraise, MD on 06/27/19. Results included: Lab Results  Component Value Date   WBC 6.9 06/27/2019   HGB 14.4 06/27/2019   HCT 42.5 06/27/2019   PLT 195 06/27/2019   GLUCOSE 144 (H) 06/27/2019   CHOL 98 (L) 06/27/2019   TRIG 156 (H) 06/27/2019   HDL 42 06/27/2019   LDLCALC 30 06/27/2019   ALT 15 06/27/2019   AST 13 06/27/2019   NA 142 06/27/2019   K 4.1 06/27/2019   CL 104 06/27/2019   CREATININE 0.79 06/27/2019   BUN 14 06/27/2019   CO2 22 06/27/2019   TSH 2.080 03/21/2019   HGBA1C 6.5 06/27/2019   07/02/19 PAT labs included: Labs Reviewed  GLUCOSE, CAPILLARY - Abnormal; Notable for the following components:      Result Value   Glucose-Capillary 135 (*)    All other components within normal limits  SURGICAL PCR SCREEN  URINALYSIS, ROUTINE W REFLEX MICROSCOPIC     IMAGES: CXR 07/02/19: FINDINGS: There is minimal scarring in the left base. Lungs elsewhere clear. Heart size and pulmonary vascularity are normal. No adenopathy. There is degenerative change in the thoracic spine. IMPRESSION: Mild scarring left base. Lungs elsewhere clear. Cardiac silhouette within normal limits.   EKG: 12/01/18: Afib at 66 bpm   CV: Carotid US 10/25/17: Final Interpretation: Right Carotid: Velocities in the right ICA are consistent with a 1-39%  stenosis. Left Carotid: Velocities in the left ICA are consistent with a 1-39% stenosis. Vertebrals: Bilateral vertebral arteries demonstrate antegrade flow.   Echo 10/24/17: Study Conclusions - Left ventricle: The cavity size was normal. There was mild concentric hypertrophy. Systolic function was normal. The estimated ejection fraction was in the range of 60% to 65%. Wall motion was normal; there were no  regional wall motion abnormalities. - Aortic valve: Transvalvular velocity was within the normal range. There was no stenosis. There was no regurgitation. - Mitral valve: Transvalvular velocity was within the normal range. There was no evidence for stenosis. There was mild regurgitation. Valve area by continuity equation (using LVOT flow): 0.9 cm^2. - Left atrium: The atrium was severely dilated. - Right ventricle: The cavity size was normal. Wall thickness was normal. Systolic function was normal. - Tricuspid valve: There was trivial regurgitation. - Pulmonary arteries: Systolic pressure was mildly increased. PA peak pressure: 41 mm Hg (S).   24 Hour Holter monitor 05/10/16: Study Highlights Atrial fib. Some ventricular ectopy. Heart rates average 81 beats. Significant brady rates happen during the sleeping hours.    Cardiac cath 02/14/07: Coronaries: Left main was normal. The LAD was normal. First diagonal was large and branching. The circumflex was large and dominant. There was an OM-1, which was branching. It was moderate size and normal. There were two posterolaterals, which were small and normal. The PDA was small and normal. The right coronary artery was nondominant. There was an anterior high takeoff. This was reached with an Amplatz one catheter. There were mild luminal irregularities. Left ventriculogram: The left ventriculogram was obtained in the RAO projection. The EF 65% with normal wall motion. CONCLUSION: Nonobstructive coronary artery disease. Well-preserved ejection fraction. PLAN: The patient will continue to have medical management and evaluation of nonanginal chest pain.   Past Medical History:  Diagnosis Date  . Arthritis    knees, shoulder,  hips (10/24/2017)  . Atrial fibrillation ([place])    on eliquis  . BPH (benign prostatic hyperplasia)   . Cataracts, bilateral    removed by surgery  . Coronary artery  disease excluded   . Diabetes mellitus without complication ([place])    type 2  . Diverticulosis   . Endocarditis, valve unspecified, unspecified cause   . GERD (gastroesophageal reflux disease)   . H/O mitral valve repair    Postoperative ring with postoperative atrial fibrillation  . Hearing loss    both ears - does not use hearing aids  . Heart murmur    hx  . Hiatal hernia 06/13/2002  . High cholesterol   . History of kidney stones    passed spontaneously x2   . OSA on CPAP    uses cpap nightly  . Stroke ([place])   . Unspecified essential hypertension   . Weakness of both arms 07/19/2012  . Wears dentures    upper and lower partial    Past Surgical History:  Procedure Laterality Date  . CARDIAC CATHETERIZATION  02/14/2007   x 2  . CARDIOVERSION N/A 04/14/2016   Procedure: CARDIOVERSION;  Surgeon: Minus Breeding, MD;  Location: [facility] ENDOSCOPY;  Service: Cardiovascular;  Laterality: N/A;  . CATARACT EXTRACTION W/PHACO Right 05/12/2015   Procedure: CATARACT EXTRACTION PHACO AND INTRAOCULAR LENS PLACEMENT RIGHT EYE CDE=7.75;  Surgeon: Tonny Branch, MD;  Location: [facility] ORS;  Service: Ophthalmology;  Laterality: Right;  . CATARACT EXTRACTION W/PHACO Left 06/12/2015   Procedure: CATARACT EXTRACTION PHACO AND INTRAOCULAR LENS PLACEMENT (IOC);  Surgeon: Tonny Branch, MD;  Location:  [facility] OR[facility];  Service: Ophthalmology;  Laterality: Left;  CDE:  13.17  . COLONOSCOPY    . EYE SURGERY     Bilateral cataracts  . INJECTION KNEE Right 02/20/2016   Procedure: KNEE INJECTION;  Surgeon: [name], MD;  Location: [place];  Service: Orthopedics;  Laterality: Right;  . JOINT REPLACEMENT    . KNEE ARTHROSCOPY Left   . LAPAROSCOPIC CHOLECYSTECTOMY    . MITRAL VALVE REPAIR  ~ 2003  . MULTIPLE TOOTH EXTRACTIONS    . SHOULDER ARTHROSCOPY WITH ROTATOR CUFF REPAIR AND OPEN BICEPS TENODESIS Right 01/26/2019   Procedure: right shoulder arthroscopy, rotator cuff repair and biceps tenodesis;  Surgeon: [name], MD;   Location: [place];  Service: Orthopedics;  Laterality: Right;  . SHOULDER OPEN ROTATOR CUFF REPAIR Left   . TOTAL KNEE ARTHROPLASTY Left 02/20/2016   Procedure: LEFT TOTAL KNEE ARTHROPLASTY WITH RIGHT KNEE INJECTION;  Surgeon: [name], MD;  Location: [place];  Service: Orthopedics;  Laterality: Left;  . UPPER GI ENDOSCOPY    . VASECTOMY      MEDICATIONS: . amLODipine (NORVASC) 5 MG tablet  . apixaban (ELIQUIS) 5 MG TABS tablet  . Calcium Carb-Cholecalciferol (CALCIUM 600 + D PO)  . Coenzyme Q10 300 MG CAPS  . lisinopril (ZESTRIL) 20 MG tablet  . metFORMIN (GLUCOPHAGE-XR) 500 MG 24 hr tablet  . Multiple Vitamin (MULTIVITAMIN WITH MINERALS) TABS tablet  . omeprazole (PRILOSEC) 20 MG capsule  . rosuvastatin (CRESTOR) 5 MG tablet   No current facility-administered medications for this encounter.    Myra Gianotti, PA-C Surgical Short Stay/Anesthesiology [facility] Phone [phone] [facility] Phone [phone] 07/03/2019 4:34 PM

## 2019-07-04 ENCOUNTER — Telehealth: Payer: Self-pay | Admitting: *Deleted

## 2019-07-04 NOTE — Telephone Encounter (Signed)
2nd pre-op appointment completed; spoke with patient's wife and updated on now scheduled OPPT appointment at 2 weeks post-op with [facility] PT in [place].

## 2019-07-05 NOTE — H&P (Signed)
TOTAL KNEE ADMISSION H&P  Patient is being admitted for right total knee arthroplasty.  Subjective:  Chief Complaint:right knee pain.  HPI: [name], 84 y.o. male, has a history of pain and functional disability in the right knee due to arthritis and has failed non-surgical conservative treatments for greater than 12 weeks to includeNSAID's and/or analgesics, corticosteriod injections, use of assistive devices, weight reduction as appropriate and activity modification.  Onset of symptoms was gradual, starting 10 years ago with gradually worsening course since that time.  Patient currently rates pain in the right knee(s) at 10 out of 10 with activity. Patient has night pain, worsening of pain with activity and weight bearing, pain that interferes with activities of daily living, pain with passive range of motion, crepitus and joint swelling.  Patient has evidence of subchondral cysts, subchondral sclerosis, periarticular osteophytes and joint space narrowing by imaging studies.  There is no active infection.  Patient Active Problem List   Diagnosis Date Noted  . Rotator cuff impingement syndrome of right shoulder 01/26/2019  . Complete tear of right rotator cuff 01/19/2019  . Biceps tendinopathy of right upper extremity 01/19/2019  . Pain in right shoulder 12/06/2018  . TIA (transient ischemic attack) 12/05/2017  . Type 2 diabetes mellitus without complication ([place]) [id number]  . Brainstem infarction ([place]) 10/25/2017  . Diplopia 10/24/2017  . S/P total knee arthroplasty, left 04/29/2016  . Unilateral primary osteoarthritis, right knee 01/15/2016  . Weakness of both arms 07/19/2012  . Colon cancer screening 06/22/2012  . Unspecified venous (peripheral) insufficiency 05/05/2012  . Chronic venous insufficiency 04/07/2012  . Other malaise and fatigue 03/16/2012  . Disturbance of skin sensation 03/16/2012  . Carotid stenosis 08/16/2011  . Obesity 08/16/2011  . Coronary artery disease  excluded   . H/O mitral valve repair   . Endocarditis, valve unspecified, unspecified cause   . Dizziness 12/16/2010  . Dyslipidemia 09/23/2010  . Essential hypertension 04/06/2007  . Sleep apnea 04/06/2007  . DYSPNEA 04/06/2007   Past Medical History:  Diagnosis Date  . Arthritis    knees, shoulder,  hips (10/24/2017)  . Atrial fibrillation ([place])    on eliquis  . BPH (benign prostatic hyperplasia)   . Cataracts, bilateral    removed by surgery  . Coronary artery disease excluded   . Diabetes mellitus without complication ([place])    type 2  . Diverticulosis   . Endocarditis, valve unspecified, unspecified cause   . GERD (gastroesophageal reflux disease)   . H/O mitral valve repair    Postoperative ring with postoperative atrial fibrillation  . Hearing loss    both ears - does not use hearing aids  . Heart murmur    hx  . Hiatal hernia 06/13/2002  . High cholesterol   . History of kidney stones    passed spontaneously x2   . OSA on CPAP    uses cpap nightly  . Stroke ([place])   . Unspecified essential hypertension   . Weakness of both arms 07/19/2012  . Wears dentures    upper and lower partial    Past Surgical History:  Procedure Laterality Date  . CARDIAC CATHETERIZATION  02/14/2007   x 2  . CARDIOVERSION N/A 04/14/2016   Procedure: CARDIOVERSION;  Surgeon: Minus Breeding, MD;  Location: [facility] ENDOSCOPY;  Service: Cardiovascular;  Laterality: N/A;  . CATARACT EXTRACTION W/PHACO Right 05/12/2015   Procedure: CATARACT EXTRACTION PHACO AND INTRAOCULAR LENS PLACEMENT RIGHT EYE CDE=7.75;  Surgeon: Tonny Branch, MD;  Location: [facility];  Service: Ophthalmology;  Laterality: Right;  . CATARACT EXTRACTION W/PHACO Left 06/12/2015   Procedure: CATARACT EXTRACTION PHACO AND INTRAOCULAR LENS PLACEMENT (IOC);  Surgeon: Tonny Branch, MD;  Location: [facility];  Service: Ophthalmology;  Laterality: Left;  CDE:  13.17  . COLONOSCOPY    . EYE SURGERY     Bilateral cataracts  . INJECTION KNEE Right  02/20/2016   Procedure: KNEE INJECTION;  Surgeon: [name], MD;  Location: [place];  Service: Orthopedics;  Laterality: Right;  . JOINT REPLACEMENT    . KNEE ARTHROSCOPY Left   . LAPAROSCOPIC CHOLECYSTECTOMY    . MITRAL VALVE REPAIR  ~ 2003  . MULTIPLE TOOTH EXTRACTIONS    . SHOULDER ARTHROSCOPY WITH ROTATOR CUFF REPAIR AND OPEN BICEPS TENODESIS Right 01/26/2019   Procedure: right shoulder arthroscopy, rotator cuff repair and biceps tenodesis;  Surgeon: [name], MD;  Location: [place];  Service: Orthopedics;  Laterality: Right;  . SHOULDER OPEN ROTATOR CUFF REPAIR Left   . TOTAL KNEE ARTHROPLASTY Left 02/20/2016   Procedure: LEFT TOTAL KNEE ARTHROPLASTY WITH RIGHT KNEE INJECTION;  Surgeon: [name], MD;  Location: [place];  Service: Orthopedics;  Laterality: Left;  . UPPER GI ENDOSCOPY    . VASECTOMY      No current facility-administered medications for this encounter.   Current Outpatient Medications  Medication Sig Dispense Refill Last Dose  . amLODipine (NORVASC) 5 MG tablet Take 1 tablet (5 mg total) by mouth daily. 90 tablet 1   . apixaban (ELIQUIS) 5 MG TABS tablet Take 1 tablet (5 mg total) by mouth 2 (two) times daily. 180 tablet 1   . Calcium Carb-Cholecalciferol (CALCIUM 600 + D PO) Take 1 tablet by mouth daily.     . Coenzyme Q10 300 MG CAPS Take 300 mg by mouth daily.     Marland Kitchen lisinopril (ZESTRIL) 20 MG tablet Take 1 tablet (20 mg total) by mouth at bedtime. 90 tablet 1   . metFORMIN (GLUCOPHAGE-XR) 500 MG 24 hr tablet TAKE 1 TABLET BY MOUTH EVERY DAY WITH BREAKFAST (Patient taking differently: Take 500 mg by mouth daily. TAKE 1 TABLET BY MOUTH EVERY DAY WITH BREAKFAST) 90 tablet 1   . Multiple Vitamin (MULTIVITAMIN WITH MINERALS) TABS tablet Take 1 tablet by mouth daily.     Marland Kitchen omeprazole (PRILOSEC) 20 MG capsule Take 1 capsule (20 mg total) by mouth daily. (Patient taking differently: Take 20 mg by mouth at bedtime. ) 90 capsule 1   . rosuvastatin (CRESTOR) 5 MG tablet Take 1  tablet (5 mg total) by mouth daily. For cholesterol (Patient taking differently: Take 5 mg by mouth every evening. For cholesterol) 90 tablet 3    Allergies  Allergen Reactions  . Flomax [Tamsulosin Hcl] Other (See Comments)    Makes him "swimmy headed"    Social History   Tobacco Use  . Smoking status: Former Smoker    Packs/day: 3.00    Years: 35.00    Pack years: 105.00    Types: Cigarettes    Quit date: 02/16/1988    Years since quitting: 31.4  . Smokeless tobacco: Never Used  . Tobacco comment:  Year Quit: 1990   Substance Use Topics  . Alcohol use: No    Family History  Problem Relation Age of Onset  . Congestive Heart Failure Father   . Heart disease Father   . Stroke Brother   . Stroke Sister   . Diabetes Mellitus II Brother   . Diabetes Mellitus II  Sister   . Lung cancer Brother   . Stroke Sister   . Stroke Brother   . Colon cancer Neg Hx   . Esophageal cancer Neg Hx   . Rectal cancer Neg Hx   . Stomach cancer Neg Hx      Review of Systems  Constitutional: Positive for activity change and chills.  Respiratory: Negative.   Genitourinary: Negative.   Musculoskeletal: Positive for gait problem and joint swelling.    Objective:  Physical Exam  Constitutional: He is oriented to person, place, and time. He appears well-developed.  HENT:  Head: Normocephalic and atraumatic.  Eyes: Pupils are equal, round, and reactive to light. EOM are normal.  Cardiovascular: Normal heart sounds.  Respiratory: No respiratory distress.  GI: He exhibits no distension. There is no abdominal tenderness.  Musculoskeletal:        General: Tenderness present.  Neurological: He is alert and oriented to person, place, and time.  Skin: Skin is warm and dry.  Psychiatric: He has a normal mood and affect.    Vital signs in last 24 hours:    Labs:   Estimated body mass index is 33.99 kg/m as calculated from the following:   Height as of 06/28/19: 5\' 11"  (1.803 m).   Weight  as of 07/02/19: 110.5 kg.   Imaging Review Plain radiographs demonstrate moderate degenerative joint disease of the right knee(s). The overall alignment ismild varus. The bone quality appears to be good for age and reported activity level.      Assessment/Plan:  End stage arthritis, right knee   The patient history, physical examination, clinical judgment of the provider and imaging studies are consistent with end stage degenerative joint disease of the right knee(s) and total knee arthroplasty is deemed medically necessary. The treatment options including medical management, injection therapy arthroscopy and arthroplasty were discussed at length. The risks and benefits of total knee arthroplasty were presented and reviewed. The risks due to aseptic loosening, infection, stiffness, patella tracking problems, thromboembolic complications and other imponderables were discussed. The patient acknowledged the explanation, agreed to proceed with the plan and consent was signed. Patient is being admitted for inpatient treatment for surgery, pain control, PT, OT, prophylactic antibiotics, VTE prophylaxis, progressive ambulation and ADL's and discharge planning. The patient is planning to be discharged home with home health services

## 2019-07-06 ENCOUNTER — Ambulatory Visit (HOSPITAL_COMMUNITY): Payer: Medicare Other | Admitting: Anesthesiology

## 2019-07-06 ENCOUNTER — Observation Stay (HOSPITAL_COMMUNITY): Payer: Medicare Other

## 2019-07-06 ENCOUNTER — Inpatient Hospital Stay (HOSPITAL_COMMUNITY)
Admission: AD | Admit: 2019-07-06 | Discharge: 2019-07-08 | DRG: 470 | Disposition: A | Discharge status: Home Health | Payer: Medicare Other | Attending: Orthopaedic Surgery | Admitting: Orthopaedic Surgery

## 2019-07-06 ENCOUNTER — Encounter (HOSPITAL_COMMUNITY)
Admission: AD | Disposition: A | Discharge status: Home Health | Payer: Self-pay | Source: Home / Self Care | Attending: Orthopaedic Surgery

## 2019-07-06 ENCOUNTER — Encounter (HOSPITAL_COMMUNITY): Payer: Self-pay | Admitting: Orthopaedic Surgery

## 2019-07-06 ENCOUNTER — Ambulatory Visit (HOSPITAL_COMMUNITY): Payer: Medicare Other | Admitting: Vascular Surgery

## 2019-07-06 ENCOUNTER — Other Ambulatory Visit: Payer: Self-pay

## 2019-07-06 DIAGNOSIS — M1711 Unilateral primary osteoarthritis, right knee: Secondary | ICD-10-CM | POA: Diagnosis not present

## 2019-07-06 DIAGNOSIS — K5790 Diverticulosis of intestine, part unspecified, without perforation or abscess without bleeding: Secondary | ICD-10-CM | POA: Diagnosis present

## 2019-07-06 DIAGNOSIS — Z8673 Personal history of transient ischemic attack (TIA), and cerebral infarction without residual deficits: Secondary | ICD-10-CM

## 2019-07-06 DIAGNOSIS — E119 Type 2 diabetes mellitus without complications: Secondary | ICD-10-CM | POA: Diagnosis present

## 2019-07-06 DIAGNOSIS — Z87442 Personal history of urinary calculi: Secondary | ICD-10-CM

## 2019-07-06 DIAGNOSIS — I872 Venous insufficiency (chronic) (peripheral): Secondary | ICD-10-CM | POA: Diagnosis not present

## 2019-07-06 DIAGNOSIS — I4891 Unspecified atrial fibrillation: Secondary | ICD-10-CM | POA: Diagnosis present

## 2019-07-06 DIAGNOSIS — K219 Gastro-esophageal reflux disease without esophagitis: Secondary | ICD-10-CM | POA: Diagnosis not present

## 2019-07-06 DIAGNOSIS — Z7901 Long term (current) use of anticoagulants: Secondary | ICD-10-CM

## 2019-07-06 DIAGNOSIS — I10 Essential (primary) hypertension: Secondary | ICD-10-CM | POA: Diagnosis not present

## 2019-07-06 DIAGNOSIS — Z8249 Family history of ischemic heart disease and other diseases of the circulatory system: Secondary | ICD-10-CM

## 2019-07-06 DIAGNOSIS — H9190 Unspecified hearing loss, unspecified ear: Secondary | ICD-10-CM | POA: Diagnosis not present

## 2019-07-06 DIAGNOSIS — Z79899 Other long term (current) drug therapy: Secondary | ICD-10-CM | POA: Diagnosis not present

## 2019-07-06 DIAGNOSIS — Z471 Aftercare following joint replacement surgery: Secondary | ICD-10-CM | POA: Diagnosis not present

## 2019-07-06 DIAGNOSIS — Z888 Allergy status to other drugs, medicaments and biological substances status: Secondary | ICD-10-CM

## 2019-07-06 DIAGNOSIS — Z833 Family history of diabetes mellitus: Secondary | ICD-10-CM | POA: Diagnosis not present

## 2019-07-06 DIAGNOSIS — G4733 Obstructive sleep apnea (adult) (pediatric): Secondary | ICD-10-CM | POA: Diagnosis not present

## 2019-07-06 DIAGNOSIS — E7800 Pure hypercholesterolemia, unspecified: Secondary | ICD-10-CM | POA: Diagnosis not present

## 2019-07-06 DIAGNOSIS — Z87891 Personal history of nicotine dependence: Secondary | ICD-10-CM | POA: Diagnosis not present

## 2019-07-06 DIAGNOSIS — Z823 Family history of stroke: Secondary | ICD-10-CM

## 2019-07-06 DIAGNOSIS — Z96652 Presence of left artificial knee joint: Secondary | ICD-10-CM | POA: Diagnosis not present

## 2019-07-06 DIAGNOSIS — Z7984 Long term (current) use of oral hypoglycemic drugs: Secondary | ICD-10-CM

## 2019-07-06 DIAGNOSIS — E785 Hyperlipidemia, unspecified: Secondary | ICD-10-CM | POA: Diagnosis not present

## 2019-07-06 DIAGNOSIS — N400 Enlarged prostate without lower urinary tract symptoms: Secondary | ICD-10-CM | POA: Diagnosis present

## 2019-07-06 DIAGNOSIS — Z801 Family history of malignant neoplasm of trachea, bronchus and lung: Secondary | ICD-10-CM

## 2019-07-06 DIAGNOSIS — Z96651 Presence of right artificial knee joint: Secondary | ICD-10-CM | POA: Diagnosis not present

## 2019-07-06 DIAGNOSIS — E669 Obesity, unspecified: Secondary | ICD-10-CM | POA: Diagnosis present

## 2019-07-06 DIAGNOSIS — Z6833 Body mass index (BMI) 33.0-33.9, adult: Secondary | ICD-10-CM

## 2019-07-06 DIAGNOSIS — G4730 Sleep apnea, unspecified: Secondary | ICD-10-CM | POA: Diagnosis not present

## 2019-07-06 DIAGNOSIS — M25761 Osteophyte, right knee: Secondary | ICD-10-CM | POA: Diagnosis present

## 2019-07-06 DIAGNOSIS — Z09 Encounter for follow-up examination after completed treatment for conditions other than malignant neoplasm: Secondary | ICD-10-CM

## 2019-07-06 DIAGNOSIS — G8918 Other acute postprocedural pain: Secondary | ICD-10-CM | POA: Diagnosis not present

## 2019-07-06 HISTORY — PX: TOTAL KNEE ARTHROPLASTY: SHX125

## 2019-07-06 LAB — PROTIME-INR
INR: 1.1 (ref 0.8–1.2)
Prothrombin Time: 13.5 seconds (ref 11.4–15.2)

## 2019-07-06 LAB — GLUCOSE, CAPILLARY
Glucose-Capillary: 144 mg/dL — ABNORMAL HIGH (ref 70–99)
Glucose-Capillary: 179 mg/dL — ABNORMAL HIGH (ref 70–99)
Glucose-Capillary: 233 mg/dL — ABNORMAL HIGH (ref 70–99)
Glucose-Capillary: 266 mg/dL — ABNORMAL HIGH (ref 70–99)

## 2019-07-06 SURGERY — ARTHROPLASTY, KNEE, TOTAL
Anesthesia: Monitor Anesthesia Care | Site: Knee | Laterality: Right

## 2019-07-06 MED ORDER — HYDROMORPHONE HCL 1 MG/ML IJ SOLN: 0.5000 mg | INTRAMUSCULAR | Status: DC | PRN

## 2019-07-06 MED ORDER — FENTANYL CITRATE (PF) 250 MCG/5ML IJ SOLN
INTRAMUSCULAR | Status: AC
  Filled 2019-07-06: qty 5

## 2019-07-06 MED ORDER — BUPIVACAINE LIPOSOME 1.3 % IJ SUSP
20.0000 mL | Freq: Once | INTRAMUSCULAR | Status: DC
  Filled 2019-07-06: qty 20

## 2019-07-06 MED ORDER — METHOCARBAMOL 500 MG PO TABS: 500.0000 mg | ORAL_TABLET | Freq: Four times a day (QID) | ORAL | 0 refills | Status: DC | PRN

## 2019-07-06 MED ORDER — METFORMIN HCL ER 500 MG PO TB24
500.0000 mg | ORAL_TABLET | Freq: Every day | ORAL | Status: DC
  Administered 2019-07-07 – 2019-07-08 (×2): 500 mg via ORAL
  Filled 2019-07-06 (×2): qty 1

## 2019-07-06 MED ORDER — LISINOPRIL 20 MG PO TABS
20.0000 mg | ORAL_TABLET | Freq: Every day | ORAL | Status: DC
  Administered 2019-07-06 – 2019-07-07 (×2): 20 mg via ORAL
  Filled 2019-07-06 (×2): qty 1

## 2019-07-06 MED ORDER — PHENYLEPHRINE HCL-NACL 10-0.9 MG/250ML-% IV SOLN
INTRAVENOUS | Status: DC | PRN
  Administered 2019-07-06: 100 ug/min via INTRAVENOUS

## 2019-07-06 MED ORDER — DEXAMETHASONE SODIUM PHOSPHATE 10 MG/ML IJ SOLN
INTRAMUSCULAR | Status: DC | PRN
  Administered 2019-07-06: 5 mg via INTRAVENOUS
  Administered 2019-07-06: 10 mg via INTRAVENOUS

## 2019-07-06 MED ORDER — ONDANSETRON HCL 4 MG PO TABS: 4.0000 mg | ORAL_TABLET | Freq: Four times a day (QID) | ORAL | Status: DC | PRN

## 2019-07-06 MED ORDER — CHLORHEXIDINE GLUCONATE 0.12 % MT SOLN
15.0000 mL | Freq: Once | OROMUCOSAL | Status: AC
  Administered 2019-07-06: 15 mL via OROMUCOSAL
  Filled 2019-07-06: qty 15

## 2019-07-06 MED ORDER — MENTHOL 3 MG MT LOZG: 1.0000 | LOZENGE | OROMUCOSAL | Status: DC | PRN

## 2019-07-06 MED ORDER — FENTANYL CITRATE (PF) 100 MCG/2ML IJ SOLN: 25.0000 ug | INTRAMUSCULAR | Status: DC | PRN

## 2019-07-06 MED ORDER — INSULIN ASPART 100 UNIT/ML ~~LOC~~ SOLN
0.0000 [IU] | Freq: Three times a day (TID) | SUBCUTANEOUS | Status: DC
  Administered 2019-07-06: 3 [IU] via SUBCUTANEOUS
  Administered 2019-07-07: 2 [IU] via SUBCUTANEOUS
  Administered 2019-07-07: 3 [IU] via SUBCUTANEOUS
  Administered 2019-07-07: 5 [IU] via SUBCUTANEOUS
  Administered 2019-07-08: 2 [IU] via SUBCUTANEOUS

## 2019-07-06 MED ORDER — PROPOFOL 10 MG/ML IV BOLUS
INTRAVENOUS | Status: AC
  Filled 2019-07-06: qty 20

## 2019-07-06 MED ORDER — POLYETHYLENE GLYCOL 3350 17 G PO PACK: 17.0000 g | PACK | Freq: Every day | ORAL | Status: DC | PRN

## 2019-07-06 MED ORDER — PHENOL 1.4 % MT LIQD: 1.0000 | OROMUCOSAL | Status: DC | PRN

## 2019-07-06 MED ORDER — OXYCODONE HCL 5 MG PO TABS: 5.0000 mg | ORAL_TABLET | Freq: Once | ORAL | Status: DC | PRN

## 2019-07-06 MED ORDER — ORAL CARE MOUTH RINSE: 15.0000 mL | Freq: Once | OROMUCOSAL | Status: AC

## 2019-07-06 MED ORDER — METHOCARBAMOL 500 MG PO TABS
500.0000 mg | ORAL_TABLET | Freq: Four times a day (QID) | ORAL | Status: DC | PRN
  Administered 2019-07-06 – 2019-07-08 (×5): 500 mg via ORAL
  Filled 2019-07-06 (×5): qty 1

## 2019-07-06 MED ORDER — TRANEXAMIC ACID-NACL 1000-0.7 MG/100ML-% IV SOLN
1000.0000 mg | INTRAVENOUS | Status: AC
  Administered 2019-07-06: 1000 mg via INTRAVENOUS

## 2019-07-06 MED ORDER — METOCLOPRAMIDE HCL 5 MG PO TABS: 5.0000 mg | ORAL_TABLET | Freq: Three times a day (TID) | ORAL | Status: DC | PRN

## 2019-07-06 MED ORDER — TRANEXAMIC ACID-NACL 1000-0.7 MG/100ML-% IV SOLN
INTRAVENOUS | Status: AC
  Filled 2019-07-06: qty 100

## 2019-07-06 MED ORDER — ACETAMINOPHEN 500 MG PO TABS: 1000.0000 mg | ORAL_TABLET | Freq: Once | ORAL | Status: DC

## 2019-07-06 MED ORDER — ROPIVACAINE HCL 5 MG/ML IJ SOLN
INTRAMUSCULAR | Status: DC | PRN
  Administered 2019-07-06: 30 mL via PERINEURAL

## 2019-07-06 MED ORDER — ONDANSETRON HCL 4 MG/2ML IJ SOLN: 4.0000 mg | Freq: Once | INTRAMUSCULAR | Status: DC | PRN

## 2019-07-06 MED ORDER — CEFAZOLIN SODIUM-DEXTROSE 2-4 GM/100ML-% IV SOLN
2.0000 g | INTRAVENOUS | Status: AC
  Administered 2019-07-06: 2 g via INTRAVENOUS

## 2019-07-06 MED ORDER — PROPOFOL 500 MG/50ML IV EMUL
INTRAVENOUS | Status: DC | PRN
  Administered 2019-07-06: 25 ug/kg/min via INTRAVENOUS

## 2019-07-06 MED ORDER — ROSUVASTATIN CALCIUM 5 MG PO TABS
5.0000 mg | ORAL_TABLET | Freq: Every evening | ORAL | Status: DC
  Administered 2019-07-06 – 2019-07-07 (×2): 5 mg via ORAL
  Filled 2019-07-06 (×2): qty 1

## 2019-07-06 MED ORDER — BUPIVACAINE IN DEXTROSE 0.75-8.25 % IT SOLN
INTRATHECAL | Status: DC | PRN
Start: 2019-07-06 — End: 2019-07-06
  Administered 2019-07-06: 2 mL via INTRATHECAL

## 2019-07-06 MED ORDER — APIXABAN 5 MG PO TABS
5.0000 mg | ORAL_TABLET | Freq: Two times a day (BID) | ORAL | Status: DC
  Administered 2019-07-06 – 2019-07-08 (×4): 5 mg via ORAL
  Filled 2019-07-06 (×4): qty 1

## 2019-07-06 MED ORDER — MIDAZOLAM HCL 2 MG/2ML IJ SOLN
INTRAMUSCULAR | Status: AC
  Filled 2019-07-06: qty 2

## 2019-07-06 MED ORDER — OXYCODONE HCL 5 MG PO TABS
5.0000 mg | ORAL_TABLET | ORAL | Status: DC | PRN
  Administered 2019-07-06 – 2019-07-08 (×6): 5 mg via ORAL
  Filled 2019-07-06 (×6): qty 1

## 2019-07-06 MED ORDER — METHOCARBAMOL 1000 MG/10ML IJ SOLN
500.0000 mg | Freq: Four times a day (QID) | INTRAVENOUS | Status: DC | PRN
  Filled 2019-07-06: qty 5

## 2019-07-06 MED ORDER — OXYCODONE HCL 5 MG/5ML PO SOLN: 5.0000 mg | Freq: Once | ORAL | Status: DC | PRN

## 2019-07-06 MED ORDER — FENTANYL CITRATE (PF) 100 MCG/2ML IJ SOLN
INTRAMUSCULAR | Status: DC | PRN
  Administered 2019-07-06: 75 ug via INTRAVENOUS
  Administered 2019-07-06: 25 ug via INTRAVENOUS

## 2019-07-06 MED ORDER — DOCUSATE SODIUM 100 MG PO CAPS
100.0000 mg | ORAL_CAPSULE | Freq: Two times a day (BID) | ORAL | Status: DC
  Administered 2019-07-06 – 2019-07-08 (×5): 100 mg via ORAL
  Filled 2019-07-06 (×5): qty 1

## 2019-07-06 MED ORDER — ONDANSETRON HCL 4 MG/2ML IJ SOLN: 4.0000 mg | Freq: Four times a day (QID) | INTRAMUSCULAR | Status: DC | PRN

## 2019-07-06 MED ORDER — BUPIVACAINE LIPOSOME 1.3 % IJ SUSP
INTRAMUSCULAR | Status: DC | PRN
  Administered 2019-07-06: 20 mL

## 2019-07-06 MED ORDER — BUPIVACAINE HCL (PF) 0.25 % IJ SOLN
INTRAMUSCULAR | Status: AC
  Filled 2019-07-06: qty 30

## 2019-07-06 MED ORDER — CEFAZOLIN SODIUM-DEXTROSE 2-4 GM/100ML-% IV SOLN
INTRAVENOUS | Status: AC
  Filled 2019-07-06: qty 100

## 2019-07-06 MED ORDER — ACETAMINOPHEN 325 MG PO TABS
325.0000 mg | ORAL_TABLET | Freq: Four times a day (QID) | ORAL | Status: DC | PRN
  Administered 2019-07-07: 650 mg via ORAL
  Filled 2019-07-06: qty 2

## 2019-07-06 MED ORDER — METOCLOPRAMIDE HCL 5 MG/ML IJ SOLN: 5.0000 mg | Freq: Three times a day (TID) | INTRAMUSCULAR | Status: DC | PRN

## 2019-07-06 MED ORDER — 0.9 % SODIUM CHLORIDE (POUR BTL) OPTIME
TOPICAL | Status: DC | PRN
  Administered 2019-07-06: 1000 mL

## 2019-07-06 MED ORDER — PANTOPRAZOLE SODIUM 40 MG PO TBEC
40.0000 mg | DELAYED_RELEASE_TABLET | Freq: Every day | ORAL | Status: DC
  Administered 2019-07-06 – 2019-07-08 (×3): 40 mg via ORAL
  Filled 2019-07-06 (×3): qty 1

## 2019-07-06 MED ORDER — SODIUM CHLORIDE 0.9 % IR SOLN
Status: DC | PRN
  Administered 2019-07-06: 3000 mL

## 2019-07-06 MED ORDER — OXYCODONE-ACETAMINOPHEN 5-325 MG PO TABS: 1.0000 | ORAL_TABLET | Freq: Four times a day (QID) | ORAL | 0 refills | Status: DC | PRN

## 2019-07-06 MED ORDER — LACTATED RINGERS IV SOLN: INTRAVENOUS | Status: DC | PRN

## 2019-07-06 MED ORDER — BUPIVACAINE HCL (PF) 0.25 % IJ SOLN
INTRAMUSCULAR | Status: DC | PRN
  Administered 2019-07-06: 30 mL

## 2019-07-06 MED ORDER — AMLODIPINE BESYLATE 5 MG PO TABS
5.0000 mg | ORAL_TABLET | Freq: Every day | ORAL | Status: DC
  Administered 2019-07-07 – 2019-07-08 (×2): 5 mg via ORAL
  Filled 2019-07-06 (×2): qty 1

## 2019-07-06 MED ORDER — ONDANSETRON HCL 4 MG/2ML IJ SOLN
INTRAMUSCULAR | Status: DC | PRN
  Administered 2019-07-06: 4 mg via INTRAVENOUS

## 2019-07-06 MED ORDER — SODIUM CHLORIDE 0.9 % IV SOLN: INTRAVENOUS | Status: DC

## 2019-07-06 SURGICAL SUPPLY — 81 items
APL SKNCLS STERI-STRIP NONHPOA (GAUZE/BANDAGES/DRESSINGS) ×1
ATTUNE MED DOME PAT 41 KNEE (Knees) ×1 IMPLANT
ATTUNE MED DOME PAT 41MM KNEE (Knees) ×1 IMPLANT
ATTUNE PS FEM RT SZ 7 CEM KNEE (Femur) ×2 IMPLANT
ATTUNE PSRP INSR SZ7 5 KNEE (Insert) ×1 IMPLANT
ATTUNE PSRP INSR SZ7 5MM KNEE (Insert) ×1 IMPLANT
BANDAGE ESMARK 6X9 LF (GAUZE/BANDAGES/DRESSINGS) ×1 IMPLANT
BASE TIBIAL ROT PLAT SZ 8 KNEE (Knees) IMPLANT
BENZOIN TINCTURE PRP APPL 2/3 (GAUZE/BANDAGES/DRESSINGS) ×3 IMPLANT
BLADE SAGITTAL 25.0X1.19X90 (BLADE) ×2 IMPLANT
BLADE SAGITTAL 25.0X1.19X90MM (BLADE) ×1
BLADE SAW SGTL 13X75X1.27 (BLADE) ×3 IMPLANT
BNDG CMPR 9X6 STRL LF SNTH (GAUZE/BANDAGES/DRESSINGS) ×1
BNDG CMPR MED 10X6 ELC LF (GAUZE/BANDAGES/DRESSINGS) ×1
BNDG ELASTIC 4X5.8 VLCR STR LF (GAUZE/BANDAGES/DRESSINGS) ×3 IMPLANT
BNDG ELASTIC 6X10 VLCR STRL LF (GAUZE/BANDAGES/DRESSINGS) ×2 IMPLANT
BNDG ESMARK 6X9 LF (GAUZE/BANDAGES/DRESSINGS) ×3
BOWL SMART MIX CTS (DISPOSABLE) ×3 IMPLANT
BSPLAT TIB 8 CMNT ROT PLAT STR (Knees) ×1 IMPLANT
CEMENT HV SMART SET (Cement) ×6 IMPLANT
COVER SURGICAL LIGHT HANDLE (MISCELLANEOUS) ×3 IMPLANT
COVER WAND RF STERILE (DRAPES) ×3 IMPLANT
CUFF TOURN SGL QUICK 34 (TOURNIQUET CUFF) ×3
CUFF TRNQT CYL 34X4.125X (TOURNIQUET CUFF) ×1 IMPLANT
DRAPE ORTHO SPLIT 77X108 STRL (DRAPES) ×6
DRAPE SURG ORHT 6 SPLT 77X108 (DRAPES) ×2 IMPLANT
DRAPE U-SHAPE 47X51 STRL (DRAPES) ×3 IMPLANT
DRSG PAD ABDOMINAL 8X10 ST (GAUZE/BANDAGES/DRESSINGS) ×3 IMPLANT
DRSG XEROFORM 1X8 (GAUZE/BANDAGES/DRESSINGS) ×2 IMPLANT
DURAPREP 26ML APPLICATOR (WOUND CARE) ×6 IMPLANT
ELECT REM PT RETURN 9FT ADLT (ELECTROSURGICAL) ×3
ELECTRODE REM PT RTRN 9FT ADLT (ELECTROSURGICAL) ×1 IMPLANT
EVACUATOR 1/8 PVC DRAIN (DRAIN) IMPLANT
FACESHIELD WRAPAROUND (MASK) ×6 IMPLANT
FACESHIELD WRAPAROUND OR TEAM (MASK) ×2 IMPLANT
GAUZE SPONGE 4X4 12PLY STRL (GAUZE/BANDAGES/DRESSINGS) ×3 IMPLANT
GAUZE XEROFORM 5X9 LF (GAUZE/BANDAGES/DRESSINGS) ×3 IMPLANT
GLOVE BIOGEL PI IND STRL 8 (GLOVE) ×2 IMPLANT
GLOVE BIOGEL PI INDICATOR 8 (GLOVE) ×4
GLOVE ORTHO TXT STRL SZ7.5 (GLOVE) ×6 IMPLANT
GOWN STRL REUS W/ TWL LRG LVL3 (GOWN DISPOSABLE) ×1 IMPLANT
GOWN STRL REUS W/ TWL XL LVL3 (GOWN DISPOSABLE) ×1 IMPLANT
GOWN STRL REUS W/TWL 2XL LVL3 (GOWN DISPOSABLE) ×3 IMPLANT
GOWN STRL REUS W/TWL LRG LVL3 (GOWN DISPOSABLE) ×3
GOWN STRL REUS W/TWL XL LVL3 (GOWN DISPOSABLE) ×3
HANDPIECE INTERPULSE COAX TIP (DISPOSABLE) ×3
IMMOBILIZER KNEE 22 UNIV (SOFTGOODS) ×3 IMPLANT
KIT BASIN OR (CUSTOM PROCEDURE TRAY) ×3 IMPLANT
KIT TURNOVER KIT B (KITS) ×3 IMPLANT
MANIFOLD NEPTUNE II (INSTRUMENTS) ×3 IMPLANT
MARKER SKIN DUAL TIP RULER LAB (MISCELLANEOUS) ×3 IMPLANT
NDL 18GX1X1/2 (RX/OR ONLY) (NEEDLE) ×1 IMPLANT
NDL HYPO 25GX1X1/2 BEV (NEEDLE) ×1 IMPLANT
NEEDLE 18GX1X1/2 (RX/OR ONLY) (NEEDLE) ×3 IMPLANT
NEEDLE HYPO 25GX1X1/2 BEV (NEEDLE) ×3 IMPLANT
NS IRRIG 1000ML POUR BTL (IV SOLUTION) ×3 IMPLANT
PACK TOTAL JOINT (CUSTOM PROCEDURE TRAY) ×3 IMPLANT
PAD ABD 8X10 STRL (GAUZE/BANDAGES/DRESSINGS) ×2 IMPLANT
PAD ARMBOARD 7.5X6 YLW CONV (MISCELLANEOUS) ×6 IMPLANT
PAD CAST 4YDX4 CTTN HI CHSV (CAST SUPPLIES) ×1 IMPLANT
PADDING CAST COTTON 4X4 STRL (CAST SUPPLIES) ×3
PADDING CAST COTTON 6X4 STRL (CAST SUPPLIES) ×3 IMPLANT
PIN DRILL FIX HALF THREAD (BIT) ×2 IMPLANT
PIN STEINMAN FIXATION KNEE (PIN) ×2 IMPLANT
SET HNDPC FAN SPRY TIP SCT (DISPOSABLE) ×1 IMPLANT
STAPLER VISISTAT 35W (STAPLE) IMPLANT
SUCTION FRAZIER HANDLE 10FR (MISCELLANEOUS) ×3
SUCTION TUBE FRAZIER 10FR DISP (MISCELLANEOUS) ×1 IMPLANT
SUT VIC AB 0 CT1 27 (SUTURE) ×3
SUT VIC AB 0 CT1 27XBRD ANBCTR (SUTURE) ×1 IMPLANT
SUT VIC AB 1 CTX 36 (SUTURE) ×6
SUT VIC AB 1 CTX36XBRD ANBCTR (SUTURE) ×2 IMPLANT
SUT VIC AB 2-0 CT1 27 (SUTURE) ×6
SUT VIC AB 2-0 CT1 TAPERPNT 27 (SUTURE) ×2 IMPLANT
SUT VIC AB 3-0 X1 27 (SUTURE) ×3 IMPLANT
SYR 50ML LL SCALE MARK (SYRINGE) ×3 IMPLANT
SYR CONTROL 10ML LL (SYRINGE) ×3 IMPLANT
TIBIAL BASE ROT PLAT SZ 8 KNEE (Knees) ×3 IMPLANT
TOWEL GREEN STERILE (TOWEL DISPOSABLE) ×3 IMPLANT
TOWEL GREEN STERILE FF (TOWEL DISPOSABLE) ×3 IMPLANT
TRAY CATH 16FR W/PLASTIC CATH (SET/KITS/TRAYS/PACK) ×2 IMPLANT

## 2019-07-06 NOTE — Op Note (Signed)
Preop diagnosis: Right knee primary osteoarthritis  Postop diagnosis: Same  Procedure: Right total knee arthroplasty  Anesthesia: Spinal plus preoperative block +20 cc Marcaine 20 cc Exparel local.  Tourniquet: 350 x 56 minutes  Surgeon: Rodell Perna, MD  Assistant: Benjiman Core, PA-C medically necessary and present for the entire procedure  Implants:Depuy Attune size 7 femur size 7 with  5 mm thick rotating platform.  Size 8 tibial baseplate.  41 mm 3 peg patella.  Procedure: After standard prepping and draping with preoperative spinal Ancef prophylaxis TXA proximal thigh tourniquet lateral post heel bump DuraPrep was used from the tip of toes the tourniquet.  Impervious stockinette Coban normal sterile skin marker Betadine Steri-Drape and split sheets drapes were used.  Timeout procedure was completed.  Midline incision was made after wrapping the leg with Esmarch tourniquet inflation.  Medial parapatellar incision was made.  There was varus deformity large marginal osteophytes medial compartment which were debrided.  Small lateral compartment osteophytes eburnated bone and exposed subchondral bone of the medial compartment.  10 mm was taken off of the femur.:  The tibia showed minimal bone taken medially due to the varus deformity.  Spacer block was tight and we came back and took 2 more millimeters off the femur tumor off the tibia which allowed 5 mm spacer block full insertion and full extension of the knee.  Chamfer cuts box cut on the femur sizing for size 7.  Posterior spurs were removed and the back of the knee and capsule was released primarily posterior medial.  Preparation of the tibia sized for 8.  Trials were inserted patella was drilled ligaments of the femur were drilled.  Knee came out full extension good collateral ligament balance on flexion-extension pulse lavage vacuum mixing of the cement.  Tibia was cemented first followed by femur insertion of the permanent poly and then  holding the patella with the patella clamp.  While cement was setting up Exparel Marcaine was injected total of 40 cc into the capsule subtendinous tissue.  Cement was hardened 15 minutes tourniquet deflated hemostasis obtained standard layered closure interrupted sutures in the deep capsule 2-0 Vicryl and subtenons tissue.  Skin staple closure postop dressing and knee immobilizer.  Instrument count needle count was correct.  Patient transferred to care room in stable condition.

## 2019-07-06 NOTE — Progress Notes (Addendum)
Pt has home CPAP at bedside. Machine/cords checked by this RT for frays and damage, and water added per pt's request. Pt placed on home machine with his ffm, and his tubing. Order placed at this time for CPAP QHS per RT protocol. RT will continue to monitor.

## 2019-07-06 NOTE — Progress Notes (Signed)
CPM removed at 2030 and placed knee immobilizer.

## 2019-07-06 NOTE — TOC Initial Note (Addendum)
Transition of Care [facility]) - Initial/Assessment Note    Patient Details  Name: [name] MRN: [id number] Date of Birth: 10-14-1935  Transition of [facility] CM/SW Contact:    Marilu Favre, RN Phone Number: 07/06/2019, 1:19 PM  Clinical Narrative:                 Spoke to patient and wife at bedside.   Per notes in Epic patient has been set up with [facility] for [place], after 2 weeks of HHPT, patient already scheduled to start OP PT at [place]. Patient and wife confirmed same. Sent message to [place] with [facility] awaiting call back , will need HHPT orders and face to face. Confirmed with Tiffany with [facility]   Patient already has a walker and raised toilet seat at home.   Expected Discharge Plan: [place] Barriers to Discharge: Continued Medical Work up   Patient Goals and CMS Choice Patient states their goals for this hospitalization and ongoing recovery are:: to return tohome CMS Medicare.gov Compare Post Acute Care list provided to:: Patient Choice offered to / list presented to : Patient, Spouse  Expected Discharge Plan and Services Expected Discharge Plan: [place]   Discharge Planning Services: CM Consult Post Acute Care Choice: [place] arrangements for the past 2 months: Single Family Home                 DME Arranged: N/A DME Agency: NA       HH Arranged: PT   Date HH Agency Contacted: 07/06/19 Time HH Agency Contacted: 26 Representative spoke with at [place]: sent Tiffany a message waiting to hear back, will need orders and face to face  Prior Living Arrangements/Services Living arrangements for the past 2 months: Single Family Home Lives with:: Spouse Patient language and need for interpreter reviewed:: Yes Do you feel safe going back to the place where you live?: Yes      Need for Family Participation in Patient Care: Yes (Comment) Care giver support system in  place?: Yes (comment) Current home services: DME Criminal Activity/Legal Involvement Pertinent to Current Situation/Hospitalization: No - Comment as needed  Activities of Daily Living      Permission Sought/Granted   Permission granted to share information with : Yes, Verbal Permission Granted  Share Information with NAME: Wife Peter Congo           Emotional Assessment Appearance:: Appears stated age Attitude/Demeanor/Rapport: Engaged Affect (typically observed): Accepting Orientation: : Oriented to Self, Oriented to Place, Oriented to  Time, Oriented to Situation Alcohol / Substance Use: Not Applicable Psych Involvement: No (comment)  Admission diagnosis:  Arthritis of right knee [M17.11] Patient Active Problem List   Diagnosis Date Noted  . Rotator cuff impingement syndrome of right shoulder 01/26/2019  . Complete tear of right rotator cuff 01/19/2019  . Biceps tendinopathy of right upper extremity 01/19/2019  . Pain in right shoulder 12/06/2018  . TIA (transient ischemic attack) 12/05/2017  . Type 2 diabetes mellitus without complication ([place]) [id number]  . 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 Diplopia 10/24/2017  . S/P total knee arthroplasty, left 04/29/2016  . Unilateral primary osteoarthritis, right knee 01/15/2016  . Weakness of both arms 07/19/2012  . Colon cancer screening 06/22/2012  . Unspecified venous (peripheral) insufficiency 05/05/2012  . Chronic venous insufficiency 04/07/2012  . Other malaise and fatigue 03/16/2012  . Disturbance of skin sensation 03/16/2012  . Carotid  stenosis 08/16/2011  . Obesity 08/16/2011  . Coronary artery disease excluded   . H/O mitral valve repair   . Endocarditis, valve unspecified, unspecified cause   . Dizziness 12/16/2010  . Dyslipidemia 09/23/2010  . Essential hypertension 04/06/2007  . Sleep apnea 04/06/2007  . DYSPNEA 04/06/2007   PCP:  Claretta Fraise, MD Pharmacy:   [id number] - [place], [place] [address] Phone: [phone] Fax: [phone]     Social Determinants of Health (SDOH) Interventions    Readmission Risk Interventions No flowsheet data found.

## 2019-07-06 NOTE — Plan of Care (Signed)
  Problem: Education: Goal: Knowledge of General Education information will improve Description Including pain rating scale, medication(s)/side effects and non-pharmacologic comfort measures Outcome: Progressing   

## 2019-07-06 NOTE — Anesthesia Procedure Notes (Signed)
Procedure Name: MAC Date/Time: 07/06/2019 7:58 AM Performed by: Imagene Riches, CRNA Pre-anesthesia Checklist: Patient identified, Emergency Drugs available, Suction available, Patient being monitored and Timeout performed Patient Re-evaluated:Patient Re-evaluated prior to induction Oxygen Delivery Method: Simple face mask Ventilation: Oral airway inserted - appropriate to patient size

## 2019-07-06 NOTE — Anesthesia Procedure Notes (Signed)
Spinal  Patient location during procedure: OR Start time: 07/06/2019 7:30 AM End time: 07/06/2019 7:35 AM Staffing Performed: anesthesiologist  Anesthesiologist: Pervis Hocking, DO Preanesthetic Checklist Completed: patient identified, IV checked, risks and benefits discussed, surgical consent, monitors and equipment checked, pre-op evaluation and timeout performed Spinal Block Patient position: sitting Prep: DuraPrep and site prepped and draped Patient monitoring: cardiac monitor, continuous pulse ox and blood pressure Approach: midline Location: L3-4 Injection technique: single-shot Needle Needle type: Pencan  Needle gauge: 24 G Needle length: 9 cm Assessment Sensory level: T6 Additional Notes Functioning IV was confirmed and monitors were applied. Sterile prep and drape, including hand hygiene and sterile gloves were used. The patient was positioned and the spine was prepped. The skin was anesthetized with lidocaine.  Free flow of clear CSF was obtained prior to injecting local anesthetic into the CSF.  The spinal needle aspirated freely following injection.  The needle was carefully withdrawn.  The patient tolerated the procedure well.

## 2019-07-06 NOTE — Anesthesia Procedure Notes (Addendum)
Anesthesia Regional Block: Adductor canal block   Pre-Anesthetic Checklist: ,, timeout performed, Correct Patient, Correct Site, Correct Laterality, Correct Procedure, Correct Position, site marked, Risks and benefits discussed,  Surgical consent,  Pre-op evaluation,  At surgeon's request and post-op pain management  Laterality: Right  Prep: Maximum Sterile Barrier Precautions used, chloraprep       Needles:  Injection technique: Single-shot  Needle Type: Echogenic Stimulator Needle     Needle Length: 9cm  Needle Gauge: 22     Additional Needles:   Procedures:,,,, ultrasound used (permanent image in chart),,,,  Narrative:  Start time: 07/06/2019 7:05 AM End time: 07/06/2019 7:10 AM Injection made incrementally with aspirations every 5 mL.  Performed by: Personally  Anesthesiologist: Pervis Hocking, DO  Additional Notes: Monitors applied. No increased pain on injection. No increased resistance to injection. Injection made in 5cc increments. Good needle visualization. Patient tolerated procedure well.

## 2019-07-06 NOTE — Progress Notes (Signed)
Patient placed on CPM 1300 removed at 1600 to work with PT. CPM placed back on patient at 1815 and will be in place for 2 hours. Will pass on to night shift RN.

## 2019-07-06 NOTE — Progress Notes (Signed)
Orthopedic Tech Progress Note Patient Details:  [name] June 05, 1935 [id number]  CPM Right Knee CPM Right Knee: On Right Knee Flexion (Degrees): 0 Right Knee Extension (Degrees): 90 Additional Comments: CPM Placed on Right Knee post op  Post Interventions Patient Tolerated: Well Instructions Provided: Adjustment of device  Kristiana Jacko A Oriyah Lamphear 07/06/2019, 2:45 PM

## 2019-07-06 NOTE — Evaluation (Signed)
Physical Therapy Evaluation Patient Details Name: [name] MRN: [id number] DOB: [birth year] Today's Date: 07/06/2019   History of Present Illness  Pt is an 84 y/o male s/p R TKA. PMH includes a fib, CAD, DM, OSA on CPAP, CVA, and s/p MVR.   Clinical Impression  Pt is s/p surgery above with deficits below. Pt with effects from nerve block and had increased buckling when taking side steps, so unable to tolerate gait this session. Required mod A for mobility with RW. Educated about HEP and knee precautions. Will continue to follow acutely to maximize functional mobility independence and safety.     Follow Up Recommendations Follow surgeon's recommendation for DC plan and follow-up therapies;Supervision for mobility/OOB    Equipment Recommendations  Rolling walker with 5" wheels    Recommendations for Other Services       Precautions / Restrictions Precautions Precautions: Knee Precaution Booklet Issued: No Precaution Comments: Verbally reviewed knee precautions with pt.  Restrictions Weight Bearing Restrictions: Yes RLE Weight Bearing: Weight bearing as tolerated      Mobility  Bed Mobility Overal bed mobility: Needs Assistance Bed Mobility: Supine to Sit     Supine to sit: Min assist     General bed mobility comments: Min A for RLE assist and trunk elevation. Increased time to come to sitting.   Transfers Overall transfer level: Needs assistance Equipment used: Rolling walker (2 wheeled) Transfers: Sit to/from Stand Sit to Stand: Mod assist;From elevated surface         General transfer comment: Mod A for lift assist and steadying from elevated surface. Cues for safe hand placement. Pt with difficulty powering up in RLE secondary to nerve block effects.   Ambulation/Gait Ambulation/Gait assistance: Mod assist   Assistive device: Rolling walker (2 wheeled)       General Gait Details: Pt taking side steps towards HOB. Pt with increased buckling of RLE  secondary to nerve block effects, so further mobility limited. Mod A for steadying.   Stairs            Wheelchair Mobility    Modified Rankin (Stroke Patients Only)       Balance Overall balance assessment: Needs assistance Sitting-balance support: No upper extremity supported;Feet supported Sitting balance-Leahy Scale: Good     Standing balance support: Bilateral upper extremity supported;During functional activity Standing balance-Leahy Scale: Poor Standing balance comment: Reliant on UE and external support                              Pertinent Vitals/Pain Pain Assessment: 0-10 Pain Score: 3  Pain Location: R knee Pain Descriptors / Indicators: Aching;Operative site guarding Pain Intervention(s): Limited activity within patient's tolerance;Monitored during session;Repositioned    Home Living Family/patient expects to be discharged to:: Private residence Living Arrangements: Spouse/significant other Available Help at Discharge: Family Type of Home: House Home Access: Stairs to enter Entrance Stairs-Rails: Psychiatric nurse of Steps: 4-5 Home Layout: One level Home Equipment: Walker - standard;Bedside commode      Prior Function Level of Independence: Independent               Hand Dominance        Extremity/Trunk Assessment   Upper Extremity Assessment Upper Extremity Assessment: Overall WFL for tasks assessed    Lower Extremity Assessment Lower Extremity Assessment: RLE deficits/detail RLE Deficits / Details: Fucntional weakness noted during side stepping as pt with increased knee buckling. Deficits consistent with post  op pain and weakness.        Communication   Communication: No difficulties  Cognition Arousal/Alertness: Awake/alert Behavior During Therapy: WFL for tasks assessed/performed Overall Cognitive Status: Within Functional Limits for tasks assessed                                         General Comments      Exercises Total Joint Exercises Ankle Circles/Pumps: AROM;Both;10 reps;Supine Quad Sets: AROM;Right;10 reps;Supine   Assessment/Plan    PT Assessment Patient needs continued PT services  PT Problem List Decreased strength;Decreased range of motion;Decreased balance;Decreased mobility;Decreased coordination;Decreased knowledge of use of DME;Decreased knowledge of precautions;Pain       PT Treatment Interventions Gait training;DME instruction;Stair training;Functional mobility training;Therapeutic activities;Therapeutic exercise;Balance training;Patient/family education    PT Goals (Current goals can be found in the Care Plan section)  Acute Rehab PT Goals Patient Stated Goal: to go home PT Goal Formulation: With patient Time For Goal Achievement: 07/20/19 Potential to Achieve Goals: Good    Frequency 7X/week   Barriers to discharge        Co-evaluation               AM-PAC PT "6 Clicks" Mobility  Outcome Measure Help needed turning from your back to your side while in a flat bed without using bedrails?: A Little Help needed moving from lying on your back to sitting on the side of a flat bed without using bedrails?: A Little Help needed moving to and from a bed to a chair (including a wheelchair)?: A Lot Help needed standing up from a chair using your arms (e.g., wheelchair or bedside chair)?: A Lot Help needed to walk in hospital room?: A Lot Help needed climbing 3-5 steps with a railing? : Total 6 Click Score: 13    End of Session Equipment Utilized During Treatment: Gait belt Activity Tolerance: Patient tolerated treatment well Patient left: in bed;with call bell/phone within reach(sitting EOB ) Nurse Communication: Mobility status PT Visit Diagnosis: Unsteadiness on feet (R26.81);Muscle weakness (generalized) (M62.81);Pain Pain - Right/Left: Right Pain - part of body: Knee    Time: 1635-1701 PT Time Calculation (min) (ACUTE  ONLY): 26 min   Charges:   PT Evaluation $PT Eval Low Complexity: 1 Low PT Treatments $Therapeutic Activity: 8-22 mins        [name], DPT  Acute Rehabilitation Services  Pager: [phone] Office: [phone]   [name] 07/06/2019, 6:17 PM

## 2019-07-06 NOTE — Transfer of Care (Signed)
Immediate Anesthesia Transfer of Care Note  Patient: [name]  Procedure(s) Performed: RIGHT TOTAL KNEE ARTHROPLASTY (Right Knee)  Patient Location: PACU  Anesthesia Type:MAC combined with regional for post-op pain  Level of Consciousness: awake and alert   Airway & Oxygen Therapy: Patient Spontanous Breathing and Patient connected to face mask oxygen  Post-op Assessment: Report given to RN and Post -op Vital signs reviewed and stable  Post vital signs: Reviewed and stable  Last Vitals:  Vitals Value Taken Time  BP 110/66 07/06/19 0940  Temp    Pulse 64 07/06/19 0940  Resp 22 07/06/19 0946  SpO2 97 % 07/06/19 0945  Vitals shown include unvalidated device data.  Last Pain:  Vitals:   07/06/19 0940  TempSrc:   PainSc: 0-No pain      Patients Stated Pain Goal: 3 (48/18/56 3149)  Complications: No apparent anesthesia complications

## 2019-07-06 NOTE — Anesthesia Postprocedure Evaluation (Signed)
Anesthesia Post Note  Patient: [name]  Procedure(s) Performed: RIGHT TOTAL KNEE ARTHROPLASTY (Right Knee)     Anesthesia Post Evaluation  Last Vitals:  Vitals:   07/06/19 1010 07/06/19 1025  BP: 102/64 111/64  Pulse: 61 (!) 56  Resp: 15 20  Temp:    SpO2: 95% 95%    Last Pain:  Vitals:   07/06/19 1025  TempSrc:   PainSc: 0-No pain                 Pervis Hocking

## 2019-07-06 NOTE — Interval H&P Note (Signed)
History and Physical Interval Note:  07/06/2019 7:22 AM  [name]  has presented today for surgery, with the diagnosis of right knee osteoarthritis.  The various methods of treatment have been discussed with the patient and family. After consideration of risks, benefits and other options for treatment, the patient has consented to  Procedure(s): RIGHT TOTAL KNEE ARTHROPLASTY (Right) as a surgical intervention.  The patient's history has been reviewed, patient examined, no change in status, stable for surgery.  I have reviewed the patient's chart and labs.  Questions were answered to the patient's satisfaction.     [name]

## 2019-07-06 NOTE — Progress Notes (Signed)
Orthopedic Tech Progress Note Patient Details:  [name] [birth year] [id number]  CPM Right Knee CPM Right Knee: On Right Knee Flexion (Degrees): 90 Right Knee Extension (Degrees): 0 Additional Comments: CPM Placed on Right Knee post op  Post Interventions Patient Tolerated: Well Instructions Provided: Adjustment of device, Care of device, Poper ambulation with device  Monee Dembeck P Lorel Monaco 07/06/2019, 1:02 PM

## 2019-07-06 NOTE — Discharge Instructions (Addendum)
INSTRUCTIONS AFTER JOINT REPLACEMENT   o Remove items at home which could result in a fall. This includes throw rugs or furniture in walking pathways o ICE to the affected joint every three hours while awake for 30 minutes at a time, for at least the first 3-5 days, and then as needed for pain and swelling.  Continue to use ice for pain and swelling. You may notice swelling that will progress down to the foot and ankle.  This is normal after surgery.  Elevate your leg when you are not up walking on it.   o Continue to use the breathing machine you got in the hospital (incentive spirometer) which will help keep your temperature down.  It is common for your temperature to cycle up and down following surgery, especially at night when you are not up moving around and exerting yourself.  The breathing machine keeps your lungs expanded and your temperature down.   DIET:  As you were doing prior to hospitalization, we recommend a well-balanced diet.  DRESSING / WOUND CARE / SHOWERING  You may change your dressing 3-5 days after surgery.  Then change the dressing every day with sterile gauze.  Please use good hand washing techniques before changing the dressing.  Do not use any lotions or creams on the incision until instructed by your surgeon. ok to shower 3 days postop.  No tub soaking.    ACTIVITY  o Increase activity slowly as tolerated, but follow the weight bearing instructions below.   o No driving for 6 weeks or until further direction given by your physician.  You cannot drive while taking narcotics.  o No lifting or carrying greater than 10 lbs. until further directed by your surgeon. o Avoid periods of inactivity such as sitting longer than an hour when not asleep. This helps prevent blood clots.  o You may return to work once you are authorized by your doctor.     WEIGHT BEARING   Weight bearing as tolerated with assist device (walker, cane, etc) as directed, use it as long as suggested  by your surgeon or therapist, typically at least 4-6 weeks.   EXERCISES  Results after joint replacement surgery are often greatly improved when you follow the exercise, range of motion and muscle strengthening exercises prescribed by your doctor. Safety measures are also important to protect the joint from further injury. Any time any of these exercises cause you to have increased pain or swelling, decrease what you are doing until you are comfortable again and then slowly increase them. If you have problems or questions, call your caregiver or physical therapist for advice.   Rehabilitation is important following a joint replacement. After just a few days of immobilization, the muscles of the leg can become weakened and shrink (atrophy).  These exercises are designed to build up the tone and strength of the thigh and leg muscles and to improve motion. Often times heat used for twenty to thirty minutes before working out will loosen up your tissues and help with improving the range of motion but do not use heat for the first two weeks following surgery (sometimes heat can increase post-operative swelling).   These exercises can be done on a training (exercise) mat, on the floor, on a table or on a bed. Use whatever works the best and is most comfortable for you.    Use music or television while you are exercising so that the exercises are a pleasant break in your day. This  will make your life better with the exercises acting as a break in your routine that you can look forward to.   Perform all exercises about fifteen times, three times per day or as directed.  You should exercise both the operative leg and the other leg as well.  Exercises include:   . Quad Sets - Tighten up the muscle on the front of the thigh (Quad) and hold for 5-10 seconds.   . Straight Leg Raises - With your knee straight (if you were given a brace, keep it on), lift the leg to 60 degrees, hold for 3 seconds, and slowly lower  the leg.  Perform this exercise against resistance later as your leg gets stronger.  . Leg Slides: Lying on your back, slowly slide your foot toward your buttocks, bending your knee up off the floor (only go as far as is comfortable). Then slowly slide your foot back down until your leg is flat on the floor again.  [place] Haring Wings: Lying on your back spread your legs to the side as far apart as you can without causing discomfort.  . Hamstring Strength:  Lying on your back, push your heel against the floor with your leg straight by tightening up the muscles of your buttocks.  Repeat, but this time bend your knee to a comfortable angle, and push your heel against the floor.  You may put a pillow under the heel to make it more comfortable if necessary.   A rehabilitation program following joint replacement surgery can speed recovery and prevent re-injury in the future due to weakened muscles. Contact your doctor or a physical therapist for more information on knee rehabilitation.    CONSTIPATION  Constipation is defined medically as fewer than three stools per week and severe constipation as less than one stool per week.  Even if you have a regular bowel pattern at home, your normal regimen is likely to be disrupted due to multiple reasons following surgery.  Combination of anesthesia, postoperative narcotics, change in appetite and fluid intake all can affect your bowels.   YOU MUST use at least one of the following options; they are listed in order of increasing strength to get the job done.  They are all available over the counter, and you may need to use some, POSSIBLY even all of these options:    Drink plenty of fluids (prune juice may be helpful) and high fiber foods Colace 100 mg by mouth twice a day  Senokot for constipation as directed and as needed Dulcolax (bisacodyl), take with full glass of water  Miralax (polyethylene glycol) once or twice a day as needed.  If you have tried all these  things and are unable to have a bowel movement in the first 3-4 days after surgery call either your surgeon or your primary doctor.    If you experience loose stools or diarrhea, hold the medications until you stool forms back up.  If your symptoms do not get better within 1 week or if they get worse, check with your doctor.  If you experience "the worst abdominal pain ever" or develop nausea or vomiting, please contact the office immediately for further recommendations for treatment.   ITCHING:  If you experience itching with your medications, try taking only a single pain pill, or even half a pain pill at a time.  You can also use Benadryl over the counter for itching or also to help with sleep.   TED HOSE STOCKINGS:  Use  stockings on both legs until for at least 2 weeks or as directed by physician office. They may be removed at night for sleeping.  MEDICATIONS:  See your medication summary on the "After Visit Summary" that nursing will review with you.  You may have some home medications which will be placed on hold until you complete the course of blood thinner medication.  It is important for you to complete the blood thinner medication as prescribed.  PRECAUTIONS:  If you experience chest pain or shortness of breath - call 911 immediately for transfer to the hospital emergency department.   If you develop a fever greater that 101 F, purulent drainage from wound, increased redness or drainage from wound, foul odor from the wound/dressing, or calf pain - CONTACT YOUR SURGEON.                                                   FOLLOW-UP APPOINTMENTS:  If you do not already have a post-op appointment, please call the office for an appointment to be seen by your surgeon.  Guidelines for how soon to be seen are listed in your "After Visit Summary", but are typically between 1-4 weeks after surgery.  OTHER INSTRUCTIONS:   Knee Replacement:  Do not place pillow under knee, focus on keeping the knee  straight while resting. CPM instructions: 0-90 degrees, 2 hours in the morning, 2 hours in the afternoon, and 2 hours in the evening. Place foam block, curve side up under heel at all times except when in CPM or when walking.  DO NOT modify, tear, cut, or change the foam block in any way.   DENTAL ANTIBIOTICS:  In most cases prophylactic antibiotics for Dental procdeures after total joint surgery are not necessary.  Exceptions are as follows:  1. History of prior total joint infection  2. Severely immunocompromised (Organ Transplant, cancer chemotherapy, Rheumatoid biologic meds such as [place])  3. Poorly controlled diabetes (A1C &gt; 8.0, blood glucose over 200)  If you have one of these conditions, contact your surgeon for an antibiotic prescription, prior to your dental procedure.   MAKE SURE YOU:  . Understand these instructions.  . Get help right away if you are not doing well or get worse.    Thank you for letting us be a part of your medical care team.  It is a privilege we respect greatly.  We hope these instructions will help you stay on track for a fast and full recovery!     Dental Antibiotics:  In most cases prophylactic antibiotics for Dental procdeures after total joint surgery are not necessary.  Exceptions are as follows:  1. History of prior total joint infection  2. Severely immunocompromised (Organ Transplant, cancer chemotherapy, Rheumatoid biologic meds such as [place])  3. Poorly controlled diabetes (A1C &gt; 8.0, blood glucose over 200)  If you have one of these conditions, contact your surgeon for an antibiotic prescription, prior to your dental procedure.   Information on my medicine - ELIQUIS (apixaban)  This medication education was reviewed with me or my healthcare representative as part of my discharge preparation.   Why was Eliquis prescribed for you? Eliquis was prescribed for you to reduce the risk of a blood clot forming that can  cause a stroke if you have a medical condition called atrial fibrillation (a type of  irregular heartbeat).  What do You need to know about Eliquis ? Take your Eliquis TWICE DAILY - one tablet in the morning and one tablet in the evening with or without food. If you have difficulty swallowing the tablet whole please discuss with your pharmacist how to take the medication safely.  Take Eliquis exactly as prescribed by your doctor and DO NOT stop taking Eliquis without talking to the doctor who prescribed the medication.  Stopping may increase your risk of developing a stroke.  Refill your prescription before you run out.  After discharge, you should have regular check-up appointments with your healthcare provider that is prescribing your Eliquis.  In the future your dose may need to be changed if your kidney function or weight changes by a significant amount or as you get older.  What do you do if you miss a dose? If you miss a dose, take it as soon as you remember on the same day and resume taking twice daily.  Do not take more than one dose of ELIQUIS at the same time to make up a missed dose.  Important Safety Information A possible side effect of Eliquis is bleeding. You should call your healthcare provider right away if you experience any of the following: ? Bleeding from an injury or your nose that does not stop. ? Unusual colored urine (red or dark brown) or unusual colored stools (red or black). ? Unusual bruising for unknown reasons. ? A serious fall or if you hit your head (even if there is no bleeding).  Some medicines may interact with Eliquis and might increase your risk of bleeding or clotting while on Eliquis. To help avoid this, consult your healthcare provider or pharmacist prior to using any new prescription or non-prescription medications, including herbals, vitamins, non-steroidal anti-inflammatory drugs (NSAIDs) and supplements.  This website has more information on  Eliquis (apixaban): http://www.eliquis.com/eliquis/home

## 2019-07-06 NOTE — Care Plan (Signed)
Ortho Bundle Case Management Note  Patient Details  Name: [name] MRN: [id number] Date of Birth: September 08, 1935  [facility] met with patient and his wife in office today during his H&P appointment with Benjiman Core, PA-C. Patient is scheduled for a Right TKA on 07/06/19 with Dr. Lorin Mercy. Discussed that he is an Ortho bundle patient through [facility]. He is agreeable to case management. He has a wife that will be with him after discharge home. He has a walker and does not need an elevated toilet seat he informed. Anticipate HHPT after short hospital stay. Choice provided and will make referral to [facility]. Also noted that patient attended [facility] in [place] for [place] previously. Will make referral for therapy to begin at 2 weeks post op. This location now [facility] PT and referral made for patient to start at 2 weeks post-op on 07/23/19.  Will continue to follow for CM needs                 DME Arranged:  (No DME needs per patient; he has all DME needed for post-op at home.) DME Agency:  NA  HH Arranged:  PT [place] Agency:     Additional Comments: Please contact me with any questions of if this plan should need to change.  Jamse Arn, RN, BSN, [employer]  [phone] 07/06/2019, 3:38 PM

## 2019-07-06 NOTE — Progress Notes (Signed)
Received patient from PACU. Patient alert and oriented. Ace wrap on right leg clean, dry, and intact. Ice pack on leg. Patient oriented to call bell and bed controls. Instructed patient to utilize call bell for assistance. Will continue to monitor.

## 2019-07-07 DIAGNOSIS — G4733 Obstructive sleep apnea (adult) (pediatric): Secondary | ICD-10-CM | POA: Diagnosis present

## 2019-07-07 DIAGNOSIS — Z888 Allergy status to other drugs, medicaments and biological substances status: Secondary | ICD-10-CM | POA: Diagnosis not present

## 2019-07-07 DIAGNOSIS — Z79899 Other long term (current) drug therapy: Secondary | ICD-10-CM | POA: Diagnosis not present

## 2019-07-07 DIAGNOSIS — E785 Hyperlipidemia, unspecified: Secondary | ICD-10-CM | POA: Diagnosis present

## 2019-07-07 DIAGNOSIS — K219 Gastro-esophageal reflux disease without esophagitis: Secondary | ICD-10-CM | POA: Diagnosis present

## 2019-07-07 DIAGNOSIS — H9190 Unspecified hearing loss, unspecified ear: Secondary | ICD-10-CM | POA: Diagnosis present

## 2019-07-07 DIAGNOSIS — K5790 Diverticulosis of intestine, part unspecified, without perforation or abscess without bleeding: Secondary | ICD-10-CM | POA: Diagnosis present

## 2019-07-07 DIAGNOSIS — Z833 Family history of diabetes mellitus: Secondary | ICD-10-CM | POA: Diagnosis not present

## 2019-07-07 DIAGNOSIS — Z87891 Personal history of nicotine dependence: Secondary | ICD-10-CM | POA: Diagnosis not present

## 2019-07-07 DIAGNOSIS — Z6833 Body mass index (BMI) 33.0-33.9, adult: Secondary | ICD-10-CM | POA: Diagnosis not present

## 2019-07-07 DIAGNOSIS — E7800 Pure hypercholesterolemia, unspecified: Secondary | ICD-10-CM | POA: Diagnosis present

## 2019-07-07 DIAGNOSIS — Z7901 Long term (current) use of anticoagulants: Secondary | ICD-10-CM | POA: Diagnosis not present

## 2019-07-07 DIAGNOSIS — Z7984 Long term (current) use of oral hypoglycemic drugs: Secondary | ICD-10-CM | POA: Diagnosis not present

## 2019-07-07 DIAGNOSIS — I10 Essential (primary) hypertension: Secondary | ICD-10-CM | POA: Diagnosis present

## 2019-07-07 DIAGNOSIS — N400 Enlarged prostate without lower urinary tract symptoms: Secondary | ICD-10-CM | POA: Diagnosis present

## 2019-07-07 DIAGNOSIS — Z87442 Personal history of urinary calculi: Secondary | ICD-10-CM | POA: Diagnosis not present

## 2019-07-07 DIAGNOSIS — I4891 Unspecified atrial fibrillation: Secondary | ICD-10-CM | POA: Diagnosis present

## 2019-07-07 DIAGNOSIS — M25761 Osteophyte, right knee: Secondary | ICD-10-CM | POA: Diagnosis present

## 2019-07-07 DIAGNOSIS — I872 Venous insufficiency (chronic) (peripheral): Secondary | ICD-10-CM | POA: Diagnosis present

## 2019-07-07 DIAGNOSIS — Z8673 Personal history of transient ischemic attack (TIA), and cerebral infarction without residual deficits: Secondary | ICD-10-CM | POA: Diagnosis not present

## 2019-07-07 DIAGNOSIS — Z96652 Presence of left artificial knee joint: Secondary | ICD-10-CM | POA: Diagnosis present

## 2019-07-07 DIAGNOSIS — M1711 Unilateral primary osteoarthritis, right knee: Secondary | ICD-10-CM | POA: Diagnosis present

## 2019-07-07 DIAGNOSIS — E669 Obesity, unspecified: Secondary | ICD-10-CM | POA: Diagnosis present

## 2019-07-07 DIAGNOSIS — E119 Type 2 diabetes mellitus without complications: Secondary | ICD-10-CM | POA: Diagnosis present

## 2019-07-07 LAB — GLUCOSE, CAPILLARY
Glucose-Capillary: 227 mg/dL — ABNORMAL HIGH (ref 70–99)
Glucose-Capillary: 268 mg/dL — ABNORMAL HIGH (ref 70–99)
Glucose-Capillary: 172 mg/dL — ABNORMAL HIGH (ref 70–99)
Glucose-Capillary: 152 mg/dL — ABNORMAL HIGH (ref 70–99)

## 2019-07-07 LAB — CBC
HCT: 39.4 % (ref 39.0–52.0)
Hemoglobin: 13.5 g/dL (ref 13.0–17.0)
MCH: 32.5 pg (ref 26.0–34.0)
MCHC: 34.3 g/dL (ref 30.0–36.0)
MCV: 94.7 fL (ref 80.0–100.0)
Platelets: 236 10*3/uL (ref 150–400)
RBC: 4.16 MIL/uL — ABNORMAL LOW (ref 4.22–5.81)
RDW: 12.5 % (ref 11.5–15.5)
WBC: 13.5 10*3/uL — ABNORMAL HIGH (ref 4.0–10.5)
nRBC: 0 % (ref 0.0–0.2)

## 2019-07-07 LAB — BASIC METABOLIC PANEL
Anion gap: 10 (ref 5–15)
BUN: 19 mg/dL (ref 8–23)
CO2: 26 mmol/L (ref 22–32)
Calcium: 8.6 mg/dL — ABNORMAL LOW (ref 8.9–10.3)
Chloride: 100 mmol/L (ref 98–111)
Creatinine, Ser: 0.83 mg/dL (ref 0.61–1.24)
GFR calc Af Amer: >60 mL/min (ref >60)
GFR calc non Af Amer: >60 mL/min (ref >60)
Glucose, Bld: 257 mg/dL — ABNORMAL HIGH (ref 70–99)
Potassium: 4.2 mmol/L (ref 3.5–5.1)
Sodium: 136 mmol/L (ref 135–145)

## 2019-07-07 LAB — HEMOGLOBIN A1C
Hgb A1c MFr Bld: 6.6 % — ABNORMAL HIGH (ref 4.8–5.6)
Mean Plasma Glucose: 142.72 mg/dL

## 2019-07-07 NOTE — Progress Notes (Signed)
Pt needs more work from PT for safe ambulation and stairs. Will need therapy Sunday then discharge.

## 2019-07-07 NOTE — Progress Notes (Signed)
Physical Therapy Treatment Patient Details Name: [name] MRN: [id number] DOB: [birth year] Today's Date: 07/07/2019    History of Present Illness Pt is an 84 y/o male s/p R TKA. PMH includes a fib, CAD, DM, OSA on CPAP, CVA, and s/p MVR.     PT Comments    Pt is progressing well towards goals. Trialed gait w/o KI and pt's R knee continues to buckle. Once KI was donned pt was able to ambulate in hall with min guard for safety. Pt performed supine HEP this session. Plan to continue gait training and progress HEP next session.     Follow Up Recommendations  Follow surgeon's recommendation for DC plan and follow-up therapies;Supervision for mobility/OOB     Equipment Recommendations  Rolling walker with 5" wheels    Recommendations for Other Services       Precautions / Restrictions Precautions Precautions: Knee Precaution Booklet Issued: No Precaution Comments: Verbally reviewed knee precautions with pt. Knee buckling w/o knee immobilizer Restrictions Weight Bearing Restrictions: (P) Yes RLE Weight Bearing: (P) Weight bearing as tolerated    Mobility  Bed Mobility Overal bed mobility: Needs Assistance Bed Mobility: Supine to Sit     Supine to sit: Supervision     General bed mobility comments: supervision for safety. Use of bed rails with HOB elevated. Increased time and effort.  Transfers Overall transfer level: Needs assistance Equipment used: Rolling walker (2 wheeled) Transfers: Sit to/from Stand Sit to Stand: Min assist;Mod assist         General transfer comment: Mod A to rise from EOB, min A to rise from recliner chair with use of arm rests.  Ambulation/Gait Ambulation/Gait assistance: Mod assist;Min guard Gait Distance (Feet): 150 Feet(1x 150 ft, 1x 32ft) Assistive device: Rolling walker (2 wheeled) Gait Pattern/deviations: Step-through pattern;Decreased stance time - right;Decreased step length - left;Decreased dorsiflexion - right;Decreased weight  shift to right;Antalgic;Trunk flexed Gait velocity: decreased   General Gait Details: First attempt at gait w/o KI required mod A secondary to R LE buckling. Manual facilitation given at quad to prevent buckling. KI donned for remainder of gait and pt was able to improve to min guard for safety and significantly increase distance. VC for forward gaze and RW management.   Stairs             Wheelchair Mobility    Modified Rankin (Stroke Patients Only)       Balance Overall balance assessment: Needs assistance Sitting-balance support: No upper extremity supported;Feet supported Sitting balance-Leahy Scale: Good     Standing balance support: Bilateral upper extremity supported;During functional activity Standing balance-Leahy Scale: Poor Standing balance comment: Reliant on UE and external support                             Cognition Arousal/Alertness: Awake/alert Behavior During Therapy: WFL for tasks assessed/performed Overall Cognitive Status: Within Functional Limits for tasks assessed                                        Exercises Total Joint Exercises Ankle Circles/Pumps: AROM;Both;10 reps;Supine Quad Sets: AROM;Right;10 reps;Supine Towel Squeeze: AROM;Right;10 reps;Supine Short Arc Quad: AROM;Right;10 reps;Supine Heel Slides: AROM;Right;10 reps;Supine Hip ABduction/ADduction: AROM;Right;10 reps;Supine Straight Leg Raises: AROM;Right;10 reps;Supine    General Comments        Pertinent Vitals/Pain Pain Assessment: Faces Faces Pain Scale: Hurts little  more Pain Location: R knee with movement Pain Descriptors / Indicators: Aching;Operative site guarding Pain Intervention(s): Limited activity within patient's tolerance;Monitored during session;Premedicated before session;Repositioned    Home Living                      Prior Function            PT Goals (current goals can now be found in the care plan section)  Acute Rehab PT Goals Patient Stated Goal: to go home PT Goal Formulation: With patient Time For Goal Achievement: 07/20/19 Potential to Achieve Goals: Good Progress towards PT goals: Progressing toward goals    Frequency    7X/week      PT Plan Current plan remains appropriate    Co-evaluation              AM-PAC PT "6 Clicks" Mobility   Outcome Measure  Help needed turning from your back to your side while in a flat bed without using bedrails?: A Little Help needed moving from lying on your back to sitting on the side of a flat bed without using bedrails?: A Little Help needed moving to and from a bed to a chair (including a wheelchair)?: A Little Help needed standing up from a chair using your arms (e.g., wheelchair or bedside chair)?: A Little Help needed to walk in hospital room?: A Little Help needed climbing 3-5 steps with a railing? : A Lot 6 Click Score: 17    End of Session Equipment Utilized During Treatment: Gait belt Activity Tolerance: Patient tolerated treatment well Patient left: with call bell/phone within reach;in chair(sitting EOB ) Nurse Communication: Mobility status PT Visit Diagnosis: Unsteadiness on feet (R26.81);Muscle weakness (generalized) (M62.81);Pain Pain - Right/Left: Right Pain - part of body: Knee     Time: 0902-0935 PT Time Calculation (min) (ACUTE ONLY): 33 min  Charges:  $Gait Training: 8-22 mins $Therapeutic Exercise: 8-22 mins                    Benjiman Core, [place] Pager [phone] Acute Rehab  Allena Katz 07/07/2019, 9:45 AM

## 2019-07-07 NOTE — Care Management Obs Status (Signed)
[facility] NOTIFICATION   Patient Details  Name: ELDOR QUEER MRN: [id number] Date of Birth: [birth year]   Medicare Observation Status Notification Given:  Yes    Claudie Leach, RN 07/07/2019, 2:26 PM

## 2019-07-07 NOTE — Progress Notes (Signed)
   Subjective: 1 Day Post-Op Procedure(s) (LRB): RIGHT TOTAL KNEE ARTHROPLASTY (Right) Patient reports pain as moderate.    Objective: Vital signs in last 24 hours: Temp:  [96.5 F (35.8 C)-98.5 F (36.9 C)] 98.2 F (36.8 C) (05/22 0601) Pulse Rate:  [66-87] 83 (05/22 0601) Resp:  [18-20] 20 (05/22 0601) BP: (116-127)/(63-84) 116/63 (05/22 0601) SpO2:  [92 %-96 %] 93 % (05/22 0601) FiO2 (%):  [0 %] 0 % (05/21 2050)  Intake/Output from previous day: 05/21 0701 - 05/22 0700 In: 1574.5 [P.O.:360; I.V.:1114.5; IV Piggyback:100] Out: 2450 [Urine:2450] Intake/Output this shift: No intake/output data recorded.  Recent Labs    07/07/19 0731  HGB 13.5   Recent Labs    07/07/19 0731  WBC 13.5*  RBC 4.16*  HCT 39.4  PLT 236   Recent Labs    07/07/19 0731  NA 136  K 4.2  CL 100  CO2 26  BUN 19  CREATININE 0.83  GLUCOSE 257*  CALCIUM 8.6*   Recent Labs    07/06/19 0543  INR 1.1    Neurologically intact No results found.  Assessment/Plan: 1 Day Post-Op Procedure(s) (LRB): RIGHT TOTAL KNEE ARTHROPLASTY (Right) Up with therapy, walked in halls . Likely home this afternoon after he does steps.   [name] 07/07/2019, 11:21 AM

## 2019-07-07 NOTE — Progress Notes (Signed)
Physical Therapy Treatment Patient Details Name: [name] MRN: [id number] DOB: August 21, 1935 Today's Date: 07/07/2019    History of Present Illness Pt is an 84 y/o male s/p R TKA. PMH includes a fib, CAD, DM, OSA on CPAP, CVA, and s/p MVR.     PT Comments    Today's skilled session focused on stair training to prepare for d/c home. On first attempt to ascend stairs, pt cried out in pain and with LOB requiring min A to correct. Pt required seated rest break secondary to pain. Pt was agreeable to attempt again and was able to negotiate 4 steps with mod A for lift and stability. Pt is heavily reliant on UE for support and fearful that his vinyl handrails will not be able to support that much weight. Pt would benefit from additional PT session for safety prior to d/c. May want to consider ascending steps backwards with RW next session as pt will have two sons present and able to assist.     Follow Up Recommendations  Follow surgeon's recommendation for DC plan and follow-up therapies;Supervision for mobility/OOB     Equipment Recommendations  Rolling walker with 5" wheels    Recommendations for Other Services       Precautions / Restrictions Precautions Precautions: Knee Precaution Booklet Issued: No Precaution Comments: Verbally reviewed knee precautions with pt. Knee buckling w/o knee immobilizer Restrictions Weight Bearing Restrictions: Yes RLE Weight Bearing: Weight bearing as tolerated    Mobility  Bed Mobility Overal bed mobility: Needs Assistance Bed Mobility: Supine to Sit     Supine to sit: Supervision     General bed mobility comments: supervision for safety. Use of bed rails with HOB elevated. Increased time and effort.  Transfers Overall transfer level: Needs assistance Equipment used: Rolling walker (2 wheeled) Transfers: Sit to/from Stand Sit to Stand: Min assist         General transfer comment: min A to rise into standing form EOB and  WC.  Ambulation/Gait Ambulation/Gait assistance: Min guard Gait Distance (Feet): 15 Feet(21ft x3 78ft x1) Assistive device: Rolling walker (2 wheeled) Gait Pattern/deviations: Step-through pattern;Decreased stance time - right;Decreased step length - left;Decreased dorsiflexion - right;Decreased weight shift to right;Antalgic;Trunk flexed Gait velocity: decreased   General Gait Details: Pt ambulated small distances <> WC for transfer down to 5N gym in preperation for stair training   Stairs Stairs: Yes Stairs assistance: Mod assist Stair Management: One rail Right;Sideways;Step to pattern Number of Stairs: 4 General stair comments: On first attempt to ascend stairs pt crying out in pain and with LOB requiring min A to correct and seated rest break secondary to pain. Pt agreeable to attempt again. Light mod A required for stability. Very heavy reliance on UE support to negotiate steps.    Wheelchair Mobility    Modified Rankin (Stroke Patients Only)       Balance Overall balance assessment: Needs assistance Sitting-balance support: No upper extremity supported;Feet supported Sitting balance-Leahy Scale: Good     Standing balance support: Bilateral upper extremity supported;During functional activity Standing balance-Leahy Scale: Poor Standing balance comment: Reliant on UE and external support                             Cognition Arousal/Alertness: Awake/alert Behavior During Therapy: WFL for tasks assessed/performed Overall Cognitive Status: Within Functional Limits for tasks assessed  Exercises      General Comments        Pertinent Vitals/Pain Pain Assessment: Faces Faces Pain Scale: Hurts little more Pain Location: R knee with movement Pain Descriptors / Indicators: Aching;Operative site guarding Pain Intervention(s): Monitored during session;Limited activity within patient's tolerance     Home Living                      Prior Function            PT Goals (current goals can now be found in the care plan section) Acute Rehab PT Goals Patient Stated Goal: to go home PT Goal Formulation: With patient Time For Goal Achievement: 07/20/19 Potential to Achieve Goals: Good Progress towards PT goals: Progressing toward goals    Frequency    7X/week      PT Plan Current plan remains appropriate    Co-evaluation              AM-PAC PT "6 Clicks" Mobility   Outcome Measure  Help needed turning from your back to your side while in a flat bed without using bedrails?: A Little Help needed moving from lying on your back to sitting on the side of a flat bed without using bedrails?: A Little Help needed moving to and from a bed to a chair (including a wheelchair)?: A Little Help needed standing up from a chair using your arms (e.g., wheelchair or bedside chair)?: A Little Help needed to walk in hospital room?: A Little Help needed climbing 3-5 steps with a railing? : A Lot 6 Click Score: 17    End of Session Equipment Utilized During Treatment: Gait belt Activity Tolerance: Patient tolerated treatment well Patient left: with call bell/phone within reach;in chair;with bed alarm set Nurse Communication: Mobility status;Other (comment)(need for 1 more night in hospital) PT Visit Diagnosis: Unsteadiness on feet (R26.81);Muscle weakness (generalized) (M62.81);Pain Pain - Right/Left: Right Pain - part of body: Knee     Time: 1419-1500 PT Time Calculation (min) (ACUTE ONLY): 41 min  Charges:  $Gait Training: 23-37 mins $Therapeutic Activity: 8-22 mins                     Benjiman Core, [place] Pager [phone] Acute Rehab  Allena Katz 07/07/2019, 3:17 PM

## 2019-07-08 LAB — GLUCOSE, CAPILLARY
Glucose-Capillary: 160 mg/dL — ABNORMAL HIGH (ref 70–99)
Glucose-Capillary: 186 mg/dL — ABNORMAL HIGH (ref 70–99)

## 2019-07-08 LAB — CBC
HCT: 35.6 % — ABNORMAL LOW (ref 39.0–52.0)
Hemoglobin: 12.1 g/dL — ABNORMAL LOW (ref 13.0–17.0)
MCH: 31.9 pg (ref 26.0–34.0)
MCHC: 34 g/dL (ref 30.0–36.0)
MCV: 93.9 fL (ref 80.0–100.0)
Platelets: 191 10*3/uL (ref 150–400)
RBC: 3.79 MIL/uL — ABNORMAL LOW (ref 4.22–5.81)
RDW: 12.8 % (ref 11.5–15.5)
WBC: 13.2 10*3/uL — ABNORMAL HIGH (ref 4.0–10.5)
nRBC: 0 % (ref 0.0–0.2)

## 2019-07-08 NOTE — Progress Notes (Signed)
Physical Therapy Treatment Patient Details Name: [name] MRN: [id number] DOB: 08/23/1935 Today's Date: 07/08/2019    History of Present Illness Pt is an 84 y/o male s/p R TKA. PMH includes a fib, CAD, DM, OSA on CPAP, CVA, and s/p MVR.     PT Comments    Pt making slow but steady progress towards his physical therapy goals. Session focused on reviewing and continuation of home exercise program for strengthening/ROM, gait training and stair training. Pt ambulating 100 feet with a walker at a min guard assist level. Discussed various methods of stair negotiation upon discharge home. After trial, recommended use of walker and +2 assist from sons, as he required min assist for this method vs mod assist yesterday with the sideways technique. Written handout provided with instructions for this method. Pt lacking 6 degrees from neutral extension and achieving 80 degrees of seated knee flexion. Will need follow up therapy to address deficits and maximize functional independence.     Follow Up Recommendations  Supervision for mobility/OOB;Home health PT     Equipment Recommendations  Rolling walker with 5" wheels    Recommendations for Other Services       Precautions / Restrictions Precautions Precautions: Knee Precaution Booklet Issued: No Precaution Comments: Verbally reviewed knee precautions with pt. Knee buckling w/o knee immobilizer Restrictions Weight Bearing Restrictions: Yes RLE Weight Bearing: Weight bearing as tolerated    Mobility  Bed Mobility Overal bed mobility: Needs Assistance Bed Mobility: Supine to Sit     Supine to sit: Supervision     General bed mobility comments: supervision for safety. use of blanket by pt to elevate RLE and bring over edge of bed. Use of rails and HOB elevated. Increased time/effort  Transfers Overall transfer level: Needs assistance Equipment used: Rolling walker (2 wheeled) Transfers: Sit to/from Stand Sit to Stand: Min  guard         General transfer comment: Min guard to rise to standing from edge of bed and recliner. Decreased eccentric control when transitioning to sitting  Ambulation/Gait Ambulation/Gait assistance: Min guard Gait Distance (Feet): 100 Feet Assistive device: Rolling walker (2 wheeled) Gait Pattern/deviations: Step-through pattern;Decreased stance time - right;Decreased dorsiflexion - right;Decreased weight shift to right;Antalgic;Trunk flexed;Decreased step length - left;Step-to pattern Gait velocity: decreased   General Gait Details: Pt initiating with step to pattern, able to progress to step through pattern. Tendency for left foot external rotation. Heavily guarded, slow, antalgic gait pattern. Chair follow utilized. Cues for activity pacing.    Stairs Stairs: Yes Stairs assistance: Min assist Stair Management: Backwards;With walker;Forwards Number of Stairs: 2 General stair comments: Pt attempted sideways technique but unable. Then trialed with walker backwards ascending, forwards descending with PT stabilizing walker and minA for stability. Cues provided for sequencing, walker use, pushing through arms.    Wheelchair Mobility    Modified Rankin (Stroke Patients Only)       Balance Overall balance assessment: Needs assistance Sitting-balance support: No upper extremity supported;Feet supported Sitting balance-Leahy Scale: Good     Standing balance support: Bilateral upper extremity supported;During functional activity Standing balance-Leahy Scale: Poor Standing balance comment: Reliant on UE and external support                             Cognition Arousal/Alertness: Awake/alert Behavior During Therapy: WFL for tasks assessed/performed Overall Cognitive Status: Within Functional Limits for tasks assessed  Exercises Total Joint Exercises Quad Sets: Right;15 reps;Supine Heel Slides:  Right;AAROM;Supine;AROM;10 reps;Seated Hip ABduction/ADduction: Right;10 reps;Supine Goniometric ROM: 6-80 degrees seated    General Comments        Pertinent Vitals/Pain Pain Assessment: Faces Faces Pain Scale: Hurts little more Pain Location: R knee with movement Pain Descriptors / Indicators: Aching;Operative site guarding Pain Intervention(s): Monitored during session;Limited activity within patient's tolerance;Premedicated before session;Repositioned    Home Living                      Prior Function            PT Goals (current goals can now be found in the care plan section) Acute Rehab PT Goals Patient Stated Goal: to go home PT Goal Formulation: With patient Time For Goal Achievement: 07/20/19 Potential to Achieve Goals: Good Progress towards PT goals: Progressing toward goals    Frequency    7X/week      PT Plan Discharge plan needs to be updated    Co-evaluation              AM-PAC PT "6 Clicks" Mobility   Outcome Measure  Help needed turning from your back to your side while in a flat bed without using bedrails?: A Little Help needed moving from lying on your back to sitting on the side of a flat bed without using bedrails?: A Little Help needed moving to and from a bed to a chair (including a wheelchair)?: A Little Help needed standing up from a chair using your arms (e.g., wheelchair or bedside chair)?: A Little Help needed to walk in hospital room?: A Little Help needed climbing 3-5 steps with a railing? : A Lot 6 Click Score: 17    End of Session Equipment Utilized During Treatment: Gait belt Activity Tolerance: Patient tolerated treatment well Patient left: with call bell/phone within reach;in chair;with bed alarm set Nurse Communication: Mobility status PT Visit Diagnosis: Unsteadiness on feet (R26.81);Muscle weakness (generalized) (M62.81);Pain Pain - Right/Left: Right Pain - part of body: Knee     Time: [id number] PT  Time Calculation (min) (ACUTE ONLY): 41 min  Charges:  $Gait Training: 23-37 mins $Therapeutic Exercise: 8-22 mins                       Wyona Almas, PT, DPT Acute Rehabilitation Services Pager [phone] Office [phone]    Deno Etienne 07/08/2019, 12:45 PM

## 2019-07-08 NOTE — Progress Notes (Signed)
Therapy today to work on stairs more then discharge. Rx sent in to pharmacy. Once cleared by PT he may be discharged.

## 2019-07-09 ENCOUNTER — Encounter: Payer: Self-pay | Admitting: *Deleted

## 2019-07-09 ENCOUNTER — Telehealth: Payer: Self-pay | Admitting: *Deleted

## 2019-07-09 NOTE — Telephone Encounter (Signed)
Ortho bundle D/C call completed. 

## 2019-07-10 DIAGNOSIS — E1151 Type 2 diabetes mellitus with diabetic peripheral angiopathy without gangrene: Secondary | ICD-10-CM | POA: Diagnosis not present

## 2019-07-10 DIAGNOSIS — E785 Hyperlipidemia, unspecified: Secondary | ICD-10-CM | POA: Diagnosis not present

## 2019-07-10 DIAGNOSIS — I4891 Unspecified atrial fibrillation: Secondary | ICD-10-CM | POA: Diagnosis not present

## 2019-07-10 DIAGNOSIS — G4733 Obstructive sleep apnea (adult) (pediatric): Secondary | ICD-10-CM | POA: Diagnosis not present

## 2019-07-10 DIAGNOSIS — I10 Essential (primary) hypertension: Secondary | ICD-10-CM | POA: Diagnosis not present

## 2019-07-10 DIAGNOSIS — M67921 Unspecified disorder of synovium and tendon, right upper arm: Secondary | ICD-10-CM | POA: Diagnosis not present

## 2019-07-10 DIAGNOSIS — I872 Venous insufficiency (chronic) (peripheral): Secondary | ICD-10-CM | POA: Diagnosis not present

## 2019-07-10 DIAGNOSIS — Z8673 Personal history of transient ischemic attack (TIA), and cerebral infarction without residual deficits: Secondary | ICD-10-CM | POA: Diagnosis not present

## 2019-07-10 DIAGNOSIS — Z7901 Long term (current) use of anticoagulants: Secondary | ICD-10-CM | POA: Diagnosis not present

## 2019-07-10 DIAGNOSIS — I6529 Occlusion and stenosis of unspecified carotid artery: Secondary | ICD-10-CM | POA: Diagnosis not present

## 2019-07-10 DIAGNOSIS — I2510 Atherosclerotic heart disease of native coronary artery without angina pectoris: Secondary | ICD-10-CM | POA: Diagnosis not present

## 2019-07-10 DIAGNOSIS — M7541 Impingement syndrome of right shoulder: Secondary | ICD-10-CM | POA: Diagnosis not present

## 2019-07-10 DIAGNOSIS — Z471 Aftercare following joint replacement surgery: Secondary | ICD-10-CM | POA: Diagnosis not present

## 2019-07-10 DIAGNOSIS — H532 Diplopia: Secondary | ICD-10-CM | POA: Diagnosis not present

## 2019-07-10 DIAGNOSIS — M19019 Primary osteoarthritis, unspecified shoulder: Secondary | ICD-10-CM | POA: Diagnosis not present

## 2019-07-10 DIAGNOSIS — Z87891 Personal history of nicotine dependence: Secondary | ICD-10-CM | POA: Diagnosis not present

## 2019-07-10 DIAGNOSIS — Z7984 Long term (current) use of oral hypoglycemic drugs: Secondary | ICD-10-CM | POA: Diagnosis not present

## 2019-07-10 DIAGNOSIS — K219 Gastro-esophageal reflux disease without esophagitis: Secondary | ICD-10-CM | POA: Diagnosis not present

## 2019-07-10 DIAGNOSIS — H9193 Unspecified hearing loss, bilateral: Secondary | ICD-10-CM | POA: Diagnosis not present

## 2019-07-10 DIAGNOSIS — M160 Bilateral primary osteoarthritis of hip: Secondary | ICD-10-CM | POA: Diagnosis not present

## 2019-07-10 DIAGNOSIS — Z96652 Presence of left artificial knee joint: Secondary | ICD-10-CM | POA: Diagnosis not present

## 2019-07-10 DIAGNOSIS — M75121 Complete rotator cuff tear or rupture of right shoulder, not specified as traumatic: Secondary | ICD-10-CM | POA: Diagnosis not present

## 2019-07-11 ENCOUNTER — Telehealth: Payer: Self-pay | Admitting: *Deleted

## 2019-07-11 NOTE — Telephone Encounter (Signed)
Ortho bundle call to patient. Reports he was having trouble with bowel movements since being home from hospital. Recommended on Monday adding Colace in addition to Miralax. Patient did have a bowel movement with this recommendation. Wife appreciative of assistance.

## 2019-07-12 DIAGNOSIS — I6529 Occlusion and stenosis of unspecified carotid artery: Secondary | ICD-10-CM | POA: Diagnosis not present

## 2019-07-12 DIAGNOSIS — I2510 Atherosclerotic heart disease of native coronary artery without angina pectoris: Secondary | ICD-10-CM | POA: Diagnosis not present

## 2019-07-12 DIAGNOSIS — Z7901 Long term (current) use of anticoagulants: Secondary | ICD-10-CM | POA: Diagnosis not present

## 2019-07-12 DIAGNOSIS — M19019 Primary osteoarthritis, unspecified shoulder: Secondary | ICD-10-CM | POA: Diagnosis not present

## 2019-07-12 DIAGNOSIS — E1151 Type 2 diabetes mellitus with diabetic peripheral angiopathy without gangrene: Secondary | ICD-10-CM | POA: Diagnosis not present

## 2019-07-12 DIAGNOSIS — I10 Essential (primary) hypertension: Secondary | ICD-10-CM | POA: Diagnosis not present

## 2019-07-12 DIAGNOSIS — Z471 Aftercare following joint replacement surgery: Secondary | ICD-10-CM | POA: Diagnosis not present

## 2019-07-12 DIAGNOSIS — H532 Diplopia: Secondary | ICD-10-CM | POA: Diagnosis not present

## 2019-07-12 DIAGNOSIS — M75121 Complete rotator cuff tear or rupture of right shoulder, not specified as traumatic: Secondary | ICD-10-CM | POA: Diagnosis not present

## 2019-07-12 DIAGNOSIS — I4891 Unspecified atrial fibrillation: Secondary | ICD-10-CM | POA: Diagnosis not present

## 2019-07-12 DIAGNOSIS — G4733 Obstructive sleep apnea (adult) (pediatric): Secondary | ICD-10-CM | POA: Diagnosis not present

## 2019-07-12 DIAGNOSIS — Z96652 Presence of left artificial knee joint: Secondary | ICD-10-CM | POA: Diagnosis not present

## 2019-07-12 DIAGNOSIS — Z8673 Personal history of transient ischemic attack (TIA), and cerebral infarction without residual deficits: Secondary | ICD-10-CM | POA: Diagnosis not present

## 2019-07-12 DIAGNOSIS — M67921 Unspecified disorder of synovium and tendon, right upper arm: Secondary | ICD-10-CM | POA: Diagnosis not present

## 2019-07-12 DIAGNOSIS — Z87891 Personal history of nicotine dependence: Secondary | ICD-10-CM | POA: Diagnosis not present

## 2019-07-12 DIAGNOSIS — K219 Gastro-esophageal reflux disease without esophagitis: Secondary | ICD-10-CM | POA: Diagnosis not present

## 2019-07-12 DIAGNOSIS — H9193 Unspecified hearing loss, bilateral: Secondary | ICD-10-CM | POA: Diagnosis not present

## 2019-07-12 DIAGNOSIS — I872 Venous insufficiency (chronic) (peripheral): Secondary | ICD-10-CM | POA: Diagnosis not present

## 2019-07-12 DIAGNOSIS — M160 Bilateral primary osteoarthritis of hip: Secondary | ICD-10-CM | POA: Diagnosis not present

## 2019-07-12 DIAGNOSIS — Z7984 Long term (current) use of oral hypoglycemic drugs: Secondary | ICD-10-CM | POA: Diagnosis not present

## 2019-07-12 DIAGNOSIS — E785 Hyperlipidemia, unspecified: Secondary | ICD-10-CM | POA: Diagnosis not present

## 2019-07-12 DIAGNOSIS — M7541 Impingement syndrome of right shoulder: Secondary | ICD-10-CM | POA: Diagnosis not present

## 2019-07-13 ENCOUNTER — Telehealth: Payer: Self-pay | Admitting: *Deleted

## 2019-07-13 DIAGNOSIS — H9193 Unspecified hearing loss, bilateral: Secondary | ICD-10-CM | POA: Diagnosis not present

## 2019-07-13 DIAGNOSIS — I10 Essential (primary) hypertension: Secondary | ICD-10-CM | POA: Diagnosis not present

## 2019-07-13 DIAGNOSIS — M67921 Unspecified disorder of synovium and tendon, right upper arm: Secondary | ICD-10-CM | POA: Diagnosis not present

## 2019-07-13 DIAGNOSIS — E1151 Type 2 diabetes mellitus with diabetic peripheral angiopathy without gangrene: Secondary | ICD-10-CM | POA: Diagnosis not present

## 2019-07-13 DIAGNOSIS — G4733 Obstructive sleep apnea (adult) (pediatric): Secondary | ICD-10-CM | POA: Diagnosis not present

## 2019-07-13 DIAGNOSIS — M160 Bilateral primary osteoarthritis of hip: Secondary | ICD-10-CM | POA: Diagnosis not present

## 2019-07-13 DIAGNOSIS — M7541 Impingement syndrome of right shoulder: Secondary | ICD-10-CM | POA: Diagnosis not present

## 2019-07-13 DIAGNOSIS — Z8673 Personal history of transient ischemic attack (TIA), and cerebral infarction without residual deficits: Secondary | ICD-10-CM | POA: Diagnosis not present

## 2019-07-13 DIAGNOSIS — I872 Venous insufficiency (chronic) (peripheral): Secondary | ICD-10-CM | POA: Diagnosis not present

## 2019-07-13 DIAGNOSIS — I6529 Occlusion and stenosis of unspecified carotid artery: Secondary | ICD-10-CM | POA: Diagnosis not present

## 2019-07-13 DIAGNOSIS — Z7901 Long term (current) use of anticoagulants: Secondary | ICD-10-CM | POA: Diagnosis not present

## 2019-07-13 DIAGNOSIS — M19019 Primary osteoarthritis, unspecified shoulder: Secondary | ICD-10-CM | POA: Diagnosis not present

## 2019-07-13 DIAGNOSIS — E785 Hyperlipidemia, unspecified: Secondary | ICD-10-CM | POA: Diagnosis not present

## 2019-07-13 DIAGNOSIS — Z7984 Long term (current) use of oral hypoglycemic drugs: Secondary | ICD-10-CM | POA: Diagnosis not present

## 2019-07-13 DIAGNOSIS — M75121 Complete rotator cuff tear or rupture of right shoulder, not specified as traumatic: Secondary | ICD-10-CM | POA: Diagnosis not present

## 2019-07-13 DIAGNOSIS — I2510 Atherosclerotic heart disease of native coronary artery without angina pectoris: Secondary | ICD-10-CM | POA: Diagnosis not present

## 2019-07-13 DIAGNOSIS — I4891 Unspecified atrial fibrillation: Secondary | ICD-10-CM | POA: Diagnosis not present

## 2019-07-13 DIAGNOSIS — H532 Diplopia: Secondary | ICD-10-CM | POA: Diagnosis not present

## 2019-07-13 DIAGNOSIS — Z87891 Personal history of nicotine dependence: Secondary | ICD-10-CM | POA: Diagnosis not present

## 2019-07-13 DIAGNOSIS — Z471 Aftercare following joint replacement surgery: Secondary | ICD-10-CM | POA: Diagnosis not present

## 2019-07-13 DIAGNOSIS — Z96652 Presence of left artificial knee joint: Secondary | ICD-10-CM | POA: Diagnosis not present

## 2019-07-13 DIAGNOSIS — K219 Gastro-esophageal reflux disease without esophagitis: Secondary | ICD-10-CM | POA: Diagnosis not present

## 2019-07-13 NOTE — Telephone Encounter (Signed)
Ortho bundle 14 day phone call made.

## 2019-07-17 DIAGNOSIS — M160 Bilateral primary osteoarthritis of hip: Secondary | ICD-10-CM | POA: Diagnosis not present

## 2019-07-17 DIAGNOSIS — Z7901 Long term (current) use of anticoagulants: Secondary | ICD-10-CM | POA: Diagnosis not present

## 2019-07-17 DIAGNOSIS — E1151 Type 2 diabetes mellitus with diabetic peripheral angiopathy without gangrene: Secondary | ICD-10-CM | POA: Diagnosis not present

## 2019-07-17 DIAGNOSIS — K219 Gastro-esophageal reflux disease without esophagitis: Secondary | ICD-10-CM | POA: Diagnosis not present

## 2019-07-17 DIAGNOSIS — H532 Diplopia: Secondary | ICD-10-CM | POA: Diagnosis not present

## 2019-07-17 DIAGNOSIS — H9193 Unspecified hearing loss, bilateral: Secondary | ICD-10-CM | POA: Diagnosis not present

## 2019-07-17 DIAGNOSIS — Z471 Aftercare following joint replacement surgery: Secondary | ICD-10-CM | POA: Diagnosis not present

## 2019-07-17 DIAGNOSIS — M7541 Impingement syndrome of right shoulder: Secondary | ICD-10-CM | POA: Diagnosis not present

## 2019-07-17 DIAGNOSIS — M75121 Complete rotator cuff tear or rupture of right shoulder, not specified as traumatic: Secondary | ICD-10-CM | POA: Diagnosis not present

## 2019-07-17 DIAGNOSIS — G4733 Obstructive sleep apnea (adult) (pediatric): Secondary | ICD-10-CM | POA: Diagnosis not present

## 2019-07-17 DIAGNOSIS — I4891 Unspecified atrial fibrillation: Secondary | ICD-10-CM | POA: Diagnosis not present

## 2019-07-17 DIAGNOSIS — I2510 Atherosclerotic heart disease of native coronary artery without angina pectoris: Secondary | ICD-10-CM | POA: Diagnosis not present

## 2019-07-17 DIAGNOSIS — I6529 Occlusion and stenosis of unspecified carotid artery: Secondary | ICD-10-CM | POA: Diagnosis not present

## 2019-07-17 DIAGNOSIS — E785 Hyperlipidemia, unspecified: Secondary | ICD-10-CM | POA: Diagnosis not present

## 2019-07-17 DIAGNOSIS — Z87891 Personal history of nicotine dependence: Secondary | ICD-10-CM | POA: Diagnosis not present

## 2019-07-17 DIAGNOSIS — Z96652 Presence of left artificial knee joint: Secondary | ICD-10-CM | POA: Diagnosis not present

## 2019-07-17 DIAGNOSIS — M19019 Primary osteoarthritis, unspecified shoulder: Secondary | ICD-10-CM | POA: Diagnosis not present

## 2019-07-17 DIAGNOSIS — I872 Venous insufficiency (chronic) (peripheral): Secondary | ICD-10-CM | POA: Diagnosis not present

## 2019-07-17 DIAGNOSIS — Z8673 Personal history of transient ischemic attack (TIA), and cerebral infarction without residual deficits: Secondary | ICD-10-CM | POA: Diagnosis not present

## 2019-07-17 DIAGNOSIS — I10 Essential (primary) hypertension: Secondary | ICD-10-CM | POA: Diagnosis not present

## 2019-07-17 DIAGNOSIS — Z7984 Long term (current) use of oral hypoglycemic drugs: Secondary | ICD-10-CM | POA: Diagnosis not present

## 2019-07-17 DIAGNOSIS — M67921 Unspecified disorder of synovium and tendon, right upper arm: Secondary | ICD-10-CM | POA: Diagnosis not present

## 2019-07-19 ENCOUNTER — Ambulatory Visit: Payer: Self-pay

## 2019-07-19 ENCOUNTER — Other Ambulatory Visit: Payer: Self-pay

## 2019-07-19 ENCOUNTER — Other Ambulatory Visit: Payer: Self-pay | Admitting: *Deleted

## 2019-07-19 ENCOUNTER — Ambulatory Visit (HOSPITAL_COMMUNITY)
Admission: RE | Admit: 2019-07-19 | Discharge: 2019-07-19 | Disposition: A | Discharge status: Home / Self Care | Payer: Medicare Other | Source: Ambulatory Visit | Attending: Surgery | Admitting: Surgery

## 2019-07-19 ENCOUNTER — Ambulatory Visit (INDEPENDENT_AMBULATORY_CARE_PROVIDER_SITE_OTHER): Payer: Medicare Other | Admitting: Surgery

## 2019-07-19 ENCOUNTER — Telehealth: Payer: Self-pay | Admitting: *Deleted

## 2019-07-19 DIAGNOSIS — Z96652 Presence of left artificial knee joint: Secondary | ICD-10-CM

## 2019-07-19 DIAGNOSIS — Z96651 Presence of right artificial knee joint: Secondary | ICD-10-CM | POA: Insufficient documentation

## 2019-07-19 DIAGNOSIS — M79661 Pain in right lower leg: Secondary | ICD-10-CM | POA: Diagnosis not present

## 2019-07-19 DIAGNOSIS — M7989 Other specified soft tissue disorders: Secondary | ICD-10-CM

## 2019-07-19 DIAGNOSIS — R600 Localized edema: Secondary | ICD-10-CM | POA: Diagnosis not present

## 2019-07-19 NOTE — Progress Notes (Addendum)
Post-Op Visit Note   Patient: [name]           Date of Birth: July 25, 1935           MRN: [id number] Visit Date: 07/19/2019 PCP: Claretta Fraise, MD   Assessment & Plan:  Chief Complaint:  Chief Complaint  Patient presents with  . Right Knee - Routine Post Op   84 year old white male returns.  He is 2-week status post right total knee replacement.  He feels like he is not moving so well with his right knee range of motion.  Physical therapist has him at 0 to 80 degrees.  Has a lot of swelling of his knee and lower leg.  Does have some right calf soreness.  He is on chronic Eliquis for history of atrial fibrillation.  No complaints of chest pain shortness of breath or fever chills.  Visit Diagnoses:  1. S/P total knee arthroplasty, left   2. Status post right knee replacement   3. Right calf pain   4. Right leg swelling     Plan: With patient's increased right lower leg swelling and calf pain postop I recommend getting a stat venous Doppler to rule out DVT.  He is currently on Eliquis.  I will await callback report.  Today the staples removed.  I will have patient follow-up with Dr. Inda Merlin next week to check on his knee swelling.  Will let Dr. Lorin Mercy decide at that point in time if right knee aspiration is indicated.    Follow-Up Instructions: Return in about 1 week (around 07/26/2019) for With Dr. Lorin Mercy for recheck right knee and possible aspiration.   Orders:  Orders Placed This Encounter  Procedures  . XR Knee 1-2 Views Right  . VAS [place] LOWER EXTREMITY VENOUS (DVT)   No orders of the defined types were placed in this encounter.   Imaging: No results found.  PMFS History: Patient Active Problem List   Diagnosis Date Noted  . S/P total knee arthroplasty 07/07/2019  . Rotator cuff impingement syndrome of right shoulder 01/26/2019  . Complete tear of right rotator cuff 01/19/2019  . Biceps tendinopathy of right upper extremity 01/19/2019  . Pain in right shoulder  12/06/2018  . TIA (transient ischemic attack) 12/05/2017  . Type 2 diabetes mellitus without complication ([place]) [id number]  . Brainstem infarction ([place]) 10/25/2017  . Diplopia 10/24/2017  . S/P total knee arthroplasty, left 04/29/2016  . Unilateral primary osteoarthritis, right knee 01/15/2016  . Weakness of both arms 07/19/2012  . Colon cancer screening 06/22/2012  . Unspecified venous (peripheral) insufficiency 05/05/2012  . Chronic venous insufficiency 04/07/2012  . Other malaise and fatigue 03/16/2012  . Disturbance of skin sensation 03/16/2012  . Carotid stenosis 08/16/2011  . Obesity 08/16/2011  . Coronary artery disease excluded   . H/O mitral valve repair   . Endocarditis, valve unspecified, unspecified cause   . Dizziness 12/16/2010  . Dyslipidemia 09/23/2010  . Essential hypertension 04/06/2007  . Sleep apnea 04/06/2007  . DYSPNEA 04/06/2007   Past Medical History:  Diagnosis Date  . Arthritis    knees, shoulder,  hips (10/24/2017)  . Atrial fibrillation ([place])    on eliquis  . BPH (benign prostatic hyperplasia)   . Cataracts, bilateral    removed by surgery  . Coronary artery disease excluded   . Diabetes mellitus without complication ([place])    type 2  . Diverticulosis   . Endocarditis, valve unspecified, unspecified cause   . GERD (gastroesophageal reflux disease)   .  H/O mitral valve repair    Postoperative ring with postoperative atrial fibrillation  . Hearing loss    both ears - does not use hearing aids  . Heart murmur    hx  . Hiatal hernia 06/13/2002  . High cholesterol   . History of kidney stones    passed spontaneously x2   . OSA on CPAP    uses cpap nightly  . Stroke ([place])   . Unspecified essential hypertension   . Weakness of both arms 07/19/2012  . Wears dentures    upper and lower partial    Family History  Problem Relation Age of Onset  . Congestive Heart Failure Father   . Heart disease Father   . Stroke Brother   . Stroke Sister   .  Diabetes Mellitus II Brother   . Diabetes Mellitus II Sister   . Lung cancer Brother   . Stroke Sister   . Stroke Brother   . Colon cancer Neg Hx   . Esophageal cancer Neg Hx   . Rectal cancer Neg Hx   . Stomach cancer Neg Hx     Past Surgical History:  Procedure Laterality Date  . CARDIAC CATHETERIZATION  02/14/2007   x 2  . CARDIOVERSION N/A 04/14/2016   Procedure: CARDIOVERSION;  Surgeon: Minus Breeding, MD;  Location: [facility] ENDOSCOPY;  Service: Cardiovascular;  Laterality: N/A;  . CATARACT EXTRACTION W/PHACO Right 05/12/2015   Procedure: CATARACT EXTRACTION PHACO AND INTRAOCULAR LENS PLACEMENT RIGHT EYE CDE=7.75;  Surgeon: Tonny Branch, MD;  Location: [facility] ORS;  Service: Ophthalmology;  Laterality: Right;  . CATARACT EXTRACTION W/PHACO Left 06/12/2015   Procedure: CATARACT EXTRACTION PHACO AND INTRAOCULAR LENS PLACEMENT (IOC);  Surgeon: Tonny Branch, MD;  Location: [facility] ORS;  Service: Ophthalmology;  Laterality: Left;  CDE:  13.17  . COLONOSCOPY    . EYE SURGERY     Bilateral cataracts  . INJECTION KNEE Right 02/20/2016   Procedure: KNEE INJECTION;  Surgeon: [name], MD;  Location: [place];  Service: Orthopedics;  Laterality: Right;  . JOINT REPLACEMENT    . KNEE ARTHROSCOPY Left   . LAPAROSCOPIC CHOLECYSTECTOMY    . MITRAL VALVE REPAIR  ~ 2003  . MULTIPLE TOOTH EXTRACTIONS    . SHOULDER ARTHROSCOPY WITH ROTATOR CUFF REPAIR AND OPEN BICEPS TENODESIS Right 01/26/2019   Procedure: right shoulder arthroscopy, rotator cuff repair and biceps tenodesis;  Surgeon: [name], MD;  Location: [place];  Service: Orthopedics;  Laterality: Right;  . SHOULDER OPEN ROTATOR CUFF REPAIR Left   . TOTAL KNEE ARTHROPLASTY Left 02/20/2016   Procedure: LEFT TOTAL KNEE ARTHROPLASTY WITH RIGHT KNEE INJECTION;  Surgeon: [name], MD;  Location: [place];  Service: Orthopedics;  Laterality: Left;  . TOTAL KNEE ARTHROPLASTY Right 07/06/2019   Procedure: RIGHT TOTAL KNEE ARTHROPLASTY;  Surgeon: [name], MD;   Location: [place];  Service: Orthopedics;  Laterality: Right;  . UPPER GI ENDOSCOPY    . VASECTOMY     Social History   Occupational History  . Occupation:  Environmental manager    Comment: RETIRED  Tobacco Use  . Smoking status: Former Smoker    Packs/day: 3.00    Years: 35.00    Pack years: 105.00    Types: Cigarettes    Quit date: 02/16/1988    Years since quitting: 31.4  . Smokeless tobacco: Never Used  . Tobacco comment:  Year Quit: 1990   Substance and Sexual Activity  . Alcohol use: No  .  Drug use: Never  . Sexual activity: Not Currently   Exam Pleasant white male alert and oriented in no acute distress.  Is ambulating reasonably well with his walker.  Knee range of motion about 5 to 80 degrees.  Moderate to marked swelling of the knee.  No signs of infection.  No drainage.  Knee staples removed and Steri-Strips applied.  He does have 2-3+ pretibial edema on the right.  Right calf is moderately tender.   X-ray Right knee 2 view shows good alignment and seating of his prosthesis.  No complicating features.     Addendum I reviewed the venous Doppler report.  This was negative for DVT.

## 2019-07-19 NOTE — Telephone Encounter (Signed)
Ortho bundle 14 day call completed. 

## 2019-07-20 DIAGNOSIS — I2510 Atherosclerotic heart disease of native coronary artery without angina pectoris: Secondary | ICD-10-CM | POA: Diagnosis not present

## 2019-07-20 DIAGNOSIS — Z7984 Long term (current) use of oral hypoglycemic drugs: Secondary | ICD-10-CM | POA: Diagnosis not present

## 2019-07-20 DIAGNOSIS — Z96652 Presence of left artificial knee joint: Secondary | ICD-10-CM | POA: Diagnosis not present

## 2019-07-20 DIAGNOSIS — I6529 Occlusion and stenosis of unspecified carotid artery: Secondary | ICD-10-CM | POA: Diagnosis not present

## 2019-07-20 DIAGNOSIS — Z8673 Personal history of transient ischemic attack (TIA), and cerebral infarction without residual deficits: Secondary | ICD-10-CM | POA: Diagnosis not present

## 2019-07-20 DIAGNOSIS — H9193 Unspecified hearing loss, bilateral: Secondary | ICD-10-CM | POA: Diagnosis not present

## 2019-07-20 DIAGNOSIS — Z471 Aftercare following joint replacement surgery: Secondary | ICD-10-CM | POA: Diagnosis not present

## 2019-07-20 DIAGNOSIS — M160 Bilateral primary osteoarthritis of hip: Secondary | ICD-10-CM | POA: Diagnosis not present

## 2019-07-20 DIAGNOSIS — Z7901 Long term (current) use of anticoagulants: Secondary | ICD-10-CM | POA: Diagnosis not present

## 2019-07-20 DIAGNOSIS — I872 Venous insufficiency (chronic) (peripheral): Secondary | ICD-10-CM | POA: Diagnosis not present

## 2019-07-20 DIAGNOSIS — I4891 Unspecified atrial fibrillation: Secondary | ICD-10-CM | POA: Diagnosis not present

## 2019-07-20 DIAGNOSIS — Z87891 Personal history of nicotine dependence: Secondary | ICD-10-CM | POA: Diagnosis not present

## 2019-07-20 DIAGNOSIS — E785 Hyperlipidemia, unspecified: Secondary | ICD-10-CM | POA: Diagnosis not present

## 2019-07-20 DIAGNOSIS — M75121 Complete rotator cuff tear or rupture of right shoulder, not specified as traumatic: Secondary | ICD-10-CM | POA: Diagnosis not present

## 2019-07-20 DIAGNOSIS — M7541 Impingement syndrome of right shoulder: Secondary | ICD-10-CM | POA: Diagnosis not present

## 2019-07-20 DIAGNOSIS — K219 Gastro-esophageal reflux disease without esophagitis: Secondary | ICD-10-CM | POA: Diagnosis not present

## 2019-07-20 DIAGNOSIS — E1151 Type 2 diabetes mellitus with diabetic peripheral angiopathy without gangrene: Secondary | ICD-10-CM | POA: Diagnosis not present

## 2019-07-20 DIAGNOSIS — G4733 Obstructive sleep apnea (adult) (pediatric): Secondary | ICD-10-CM | POA: Diagnosis not present

## 2019-07-20 DIAGNOSIS — I10 Essential (primary) hypertension: Secondary | ICD-10-CM | POA: Diagnosis not present

## 2019-07-20 DIAGNOSIS — H532 Diplopia: Secondary | ICD-10-CM | POA: Diagnosis not present

## 2019-07-20 DIAGNOSIS — M19019 Primary osteoarthritis, unspecified shoulder: Secondary | ICD-10-CM | POA: Diagnosis not present

## 2019-07-20 DIAGNOSIS — M67921 Unspecified disorder of synovium and tendon, right upper arm: Secondary | ICD-10-CM | POA: Diagnosis not present

## 2019-07-23 DIAGNOSIS — M1711 Unilateral primary osteoarthritis, right knee: Secondary | ICD-10-CM | POA: Diagnosis not present

## 2019-07-24 NOTE — Discharge Summary (Signed)
Patient ID: [name] MRN: [id number] DOB/AGE: 04/12/1935 84 y.o.  Admit date: 07/06/2019 Discharge date: Jul 08, 2019 Admission Diagnoses:  Active Problems:   Unilateral primary osteoarthritis, right knee   S/P total knee arthroplasty   Discharge Diagnoses:  Active Problems:   Unilateral primary osteoarthritis, right knee   S/P total knee arthroplasty  status post Procedure(s): RIGHT TOTAL KNEE ARTHROPLASTY  Past Medical History:  Diagnosis Date  . Arthritis    knees, shoulder,  hips (10/24/2017)  . Atrial fibrillation ([place])    on eliquis  . BPH (benign prostatic hyperplasia)   . Cataracts, bilateral    removed by surgery  . Coronary artery disease excluded   . Diabetes mellitus without complication ([place])    type 2  . Diverticulosis   . Endocarditis, valve unspecified, unspecified cause   . GERD (gastroesophageal reflux disease)   . H/O mitral valve repair    Postoperative ring with postoperative atrial fibrillation  . Hearing loss    both ears - does not use hearing aids  . Heart murmur    hx  . Hiatal hernia 06/13/2002  . High cholesterol   . History of kidney stones    passed spontaneously x2   . OSA on CPAP    uses cpap nightly  . Stroke ([place])   . Unspecified essential hypertension   . Weakness of both arms 07/19/2012  . Wears dentures    upper and lower partial    Surgeries: Procedure(s): RIGHT TOTAL KNEE ARTHROPLASTY on 07/06/2019   Consultants:   Discharged Condition: Improved  Hospital Course: TRENNON TORBECK is an 84 y.o. male who was admitted 07/06/2019 for operative treatment of right knee DJD. Patient failed conservative treatments (please see the history and physical for the specifics) and had severe unremitting pain that affects sleep, daily activities and work/hobbies. After pre-op clearance, the patient was taken to the operating room on 07/06/2019 and underwent  Procedure(s): RIGHT TOTAL KNEE ARTHROPLASTY.    Patient was given  perioperative antibiotics:  Anti-infectives (From admission, onward)   Start     Dose/Rate Route Frequency Ordered Stop   07/06/19 0600  ceFAZolin (ANCEF) IVPB 2g/100 mL premix     2 g 200 mL/hr over 30 Minutes Intravenous On call to O.R. 07/06/19 0542 07/06/19 0736   07/06/19 0547  ceFAZolin (ANCEF) 2-4 GM/100ML-% IVPB    Note to Pharmacy: Laurita Quint   : cabinet override      07/06/19 0547 07/06/19 0742       Patient was given sequential compression devices and early ambulation to prevent DVT.   Patient benefited maximally from hospital stay and there were no complications. At the time of discharge, the patient was urinating/moving their bowels without difficulty, tolerating a regular diet, pain is controlled with oral pain medications and they have been cleared by PT/OT.   Recent vital signs: No data found.   Recent laboratory studies: No results for input(s): WBC, HGB, HCT, PLT, NA, K, CL, CO2, BUN, CREATININE, GLUCOSE, INR, CALCIUM in the last 72 hours.  Invalid input(s): PT, 2   Discharge Medications:   Allergies as of 07/08/2019      Reactions   Flomax [tamsulosin Hcl] Other (See Comments)   Makes him "swimmy headed"      Medication List    TAKE these medications   amLODipine 5 MG tablet Commonly known as: NORVASC Take 1 tablet (5 mg total) by mouth daily.   apixaban 5 MG Tabs tablet Commonly known  as: Eliquis Take 1 tablet (5 mg total) by mouth 2 (two) times daily.   CALCIUM 600 + D PO Take 1 tablet by mouth daily.   Coenzyme Q10 300 MG Caps Take 300 mg by mouth daily.   lisinopril 20 MG tablet Commonly known as: ZESTRIL Take 1 tablet (20 mg total) by mouth at bedtime.   metFORMIN 500 MG 24 hr tablet Commonly known as: GLUCOPHAGE-XR TAKE 1 TABLET BY MOUTH EVERY DAY WITH BREAKFAST What changed:   how much to take  how to take this  when to take this   methocarbamol 500 MG tablet Commonly known as: Robaxin Take 1 tablet (500 mg total) by mouth  every 6 (six) hours as needed for muscle spasms.   multivitamin with minerals Tabs tablet Take 1 tablet by mouth daily.   omeprazole 20 MG capsule Commonly known as: PRILOSEC Take 1 capsule (20 mg total) by mouth daily. What changed: when to take this   oxyCODONE-acetaminophen 5-325 MG tablet Commonly known as: PERCOCET/ROXICET Take 1 tablet by mouth every 6 (six) hours as needed for severe pain.   rosuvastatin 5 MG tablet Commonly known as: Crestor Take 1 tablet (5 mg total) by mouth daily. For cholesterol What changed: when to take this       Diagnostic Studies: DG Chest 2 View  Result Date: 07/02/2019 CLINICAL DATA:  Preoperative assessment for total knee arthroplasty EXAM: CHEST - 2 VIEW COMPARISON:  February 11, 2016 FINDINGS: There is minimal scarring in the left base. Lungs elsewhere clear. Heart size and pulmonary vascularity are normal. No adenopathy. There is degenerative change in the thoracic spine. IMPRESSION: Mild scarring left base. Lungs elsewhere clear. Cardiac silhouette within normal limits. Electronically Signed   By: Lowella Grip III M.D.   On: 07/02/2019 13:12   US Venous Img Lower Unilateral Right (DVT)  Result Date: 07/19/2019 CLINICAL DATA:  Right lower extremity pain and edema. Status post recent right knee arthroplasty. EXAM: RIGHT LOWER EXTREMITY VENOUS DOPPLER ULTRASOUND TECHNIQUE: Gray-scale sonography with graded compression, as well as color Doppler and duplex ultrasound were performed to evaluate the lower extremity deep venous systems from the level of the common femoral vein and including the common femoral, femoral, profunda femoral, popliteal and calf veins including the posterior tibial, peroneal and gastrocnemius veins when visible. The superficial great saphenous vein was also interrogated. Spectral Doppler was utilized to evaluate flow at rest and with distal augmentation maneuvers in the common femoral, femoral and popliteal veins. COMPARISON:   10/02/2018 FINDINGS: Contralateral Common Femoral Vein: Respiratory phasicity is normal and symmetric with the symptomatic side. No evidence of thrombus. Normal compressibility. Common Femoral Vein: No evidence of thrombus. Normal compressibility, respiratory phasicity and response to augmentation. Saphenofemoral Junction: No evidence of thrombus. Normal compressibility and flow on color Doppler imaging. Profunda Femoral Vein: No evidence of thrombus. Normal compressibility and flow on color Doppler imaging. Femoral Vein: No evidence of thrombus. Normal compressibility, respiratory phasicity and response to augmentation. Popliteal Vein: No evidence of thrombus. Normal compressibility, respiratory phasicity and response to augmentation. Calf Veins: No evidence of thrombus. Normal compressibility and flow on color Doppler imaging. Superficial Great Saphenous Vein: No evidence of thrombus. Normal compressibility. Venous Reflux:  None. Other Findings: No evidence of superficial thrombophlebitis or abnormal fluid collection. IMPRESSION: No evidence of right lower extremity deep venous thrombosis. Electronically Signed   By: Aletta Edouard M.D.   On: 07/19/2019 15:30   DG Knee Right Port  Result Date: 07/06/2019 CLINICAL DATA:  Total knee replacement on the right. EXAM: PORTABLE RIGHT KNEE - 1-2 VIEW COMPARISON:  10/06/2018 FINDINGS: Right total knee replacement. Components appear well positioned. No radiographically detectable complication. Small amount of joint fluid as expected. Question possibility of calcified loose bodies in a Baker's cyst. IMPRESSION: Total knee replacement as above. Question calcified loose bodies in a Baker cyst. Electronically Signed   By: Nelson Chimes M.D.   On: 07/06/2019 10:28      Follow-up Information    [name], MD. Go on 07/19/2019.   Specialty: Orthopedic Surgery Why: at 10:15 with Benjiman Core, PA-C with Dr. Lorin Mercy. Contact information: [place]  [address] [phone]        [facility] Follow up.   Specialty: Home Health Services Why: Someome from the home health agency will be in contact with you to schedule your first in home physical therapy appointment. Please be available for their call. Contact information: [address] [place] [address] [place] on 07/23/2019.   Specialty: Physical Therapy Why: at 2:00 pm for your first Outpatient Physical Therapy appointment. Please arrive 15 minutes early for needed paperwork completion. Contact information: [facility] [place] [address] [phone]           Discharge Plan:  discharge to home  Disposition:     Signed: Benjiman Core  07/24/2019, 3:37 PM

## 2019-07-25 ENCOUNTER — Encounter: Payer: Self-pay | Admitting: Orthopaedic Surgery

## 2019-07-25 ENCOUNTER — Other Ambulatory Visit: Payer: Self-pay

## 2019-07-25 ENCOUNTER — Ambulatory Visit (INDEPENDENT_AMBULATORY_CARE_PROVIDER_SITE_OTHER): Payer: Medicare Other | Admitting: Orthopaedic Surgery

## 2019-07-25 DIAGNOSIS — M1711 Unilateral primary osteoarthritis, right knee: Secondary | ICD-10-CM | POA: Diagnosis not present

## 2019-07-25 DIAGNOSIS — Z96651 Presence of right artificial knee joint: Secondary | ICD-10-CM | POA: Insufficient documentation

## 2019-07-25 MED ORDER — OXYCODONE-ACETAMINOPHEN 5-325 MG PO TABS: 1.0000 | ORAL_TABLET | Freq: Four times a day (QID) | ORAL | 0 refills | Status: DC | PRN

## 2019-07-25 NOTE — Progress Notes (Signed)
Post-Op Visit Note   Patient: [name]           Date of Birth: [birth year]           MRN: [id number] Visit Date: 07/25/2019 PCP: Claretta Fraise, MD   Assessment & Plan: Post right total knee arthroplasty.  Patient has some pitting edema we will put him in on teds.  He has a flexion and 90 has full extension but is having trouble after doing some therapy may have overdone it is having problems with knee extension.  Quad patellar tendon is working but he has soreness medially over the pes bursa.  He will continue to keep his flexion and extension continue using his walker continue outpatient therapy and I will recheck him in 3 weeks in the [place] clinic.  Chief Complaint:  Chief Complaint  Patient presents with  . Right Knee - Follow-up    07/06/2019 Right TKA   Visit Diagnoses:  1. S/P total knee arthroplasty, right     Plan: Percocet renewed he will call if he needs more muscle relaxant recheck in in clinic in 3 weeks continue outpatient therapy.  Follow-Up Instructions: Return in about 3 years (around 07/25/2022).   Orders:  No orders of the defined types were placed in this encounter.  Meds ordered this encounter  Medications  . oxyCODONE-acetaminophen (PERCOCET/ROXICET) 5-325 MG tablet    Sig: Take 1 tablet by mouth every 6 (six) hours as needed for severe pain.    Dispense:  40 tablet    Refill:  0    Imaging: No results found.  PMFS History: Patient Active Problem List   Diagnosis Date Noted  . S/P total knee arthroplasty, right 07/25/2019  . S/P total knee arthroplasty 07/07/2019  . Rotator cuff impingement syndrome of right shoulder 01/26/2019  . Complete tear of right rotator cuff 01/19/2019  . Biceps tendinopathy of right upper extremity 01/19/2019  . Pain in right shoulder 12/06/2018  . TIA (transient ischemic attack) 12/05/2017  . Type 2 diabetes mellitus without complication ([place]) 47/42/5956  . 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 Diplopia  10/24/2017  . S/P total knee arthroplasty, left 04/29/2016  . Weakness of both arms 07/19/2012  . Colon cancer screening 06/22/2012  . Unspecified venous (peripheral) insufficiency 05/05/2012  . Chronic venous insufficiency 04/07/2012  . Other malaise and fatigue 03/16/2012  . Disturbance of skin sensation 03/16/2012  . Carotid stenosis 08/16/2011  . Obesity 08/16/2011  . Coronary artery disease excluded   . H/O mitral valve repair   . Endocarditis, valve unspecified, unspecified cause   . Dizziness 12/16/2010  . Dyslipidemia 09/23/2010  . Essential hypertension 04/06/2007  . Sleep apnea 04/06/2007  . DYSPNEA 04/06/2007   Past Medical History:  Diagnosis Date  . Arthritis    knees, shoulder,  hips (10/24/2017)  . Atrial fibrillation ([place])    on eliquis  . BPH (benign prostatic hyperplasia)   . Cataracts, bilateral    removed by surgery  . Coronary artery disease excluded   . Diabetes mellitus without complication ([place])    type 2  . Diverticulosis   . Endocarditis, valve unspecified, unspecified cause   . GERD (gastroesophageal reflux disease)   . H/O mitral valve repair    Postoperative ring with postoperative atrial fibrillation  . Hearing loss    both ears - does not use hearing aids  . Heart murmur    hx  . Hiatal hernia 06/13/2002  . High cholesterol   . History  of kidney stones    passed spontaneously x2   . OSA on CPAP    uses cpap nightly  . Stroke ([place])   . Unspecified essential hypertension   . Weakness of both arms 07/19/2012  . Wears dentures    upper and lower partial    Family History  Problem Relation Age of Onset  . Congestive Heart Failure Father   . Heart disease Father   . Stroke Brother   . Stroke Sister   . Diabetes Mellitus II Brother   . Diabetes Mellitus II Sister   . Lung cancer Brother   . Stroke Sister   . Stroke Brother   . Colon cancer Neg Hx   . Esophageal cancer Neg Hx   . Rectal cancer Neg Hx   . Stomach cancer Neg Hx       Past Surgical History:  Procedure Laterality Date  . CARDIAC CATHETERIZATION  02/14/2007   x 2  . CARDIOVERSION N/A 04/14/2016   Procedure: CARDIOVERSION;  Surgeon: Minus Breeding, MD;  Location: [facility] ENDOSCOPY;  Service: Cardiovascular;  Laterality: N/A;  . CATARACT EXTRACTION W/PHACO Right 05/12/2015   Procedure: CATARACT EXTRACTION PHACO AND INTRAOCULAR LENS PLACEMENT RIGHT EYE CDE=7.75;  Surgeon: Tonny Branch, MD;  Location: [facility] ORS;  Service: Ophthalmology;  Laterality: Right;  . CATARACT EXTRACTION W/PHACO Left 06/12/2015   Procedure: CATARACT EXTRACTION PHACO AND INTRAOCULAR LENS PLACEMENT (IOC);  Surgeon: Tonny Branch, MD;  Location: [facility] ORS;  Service: Ophthalmology;  Laterality: Left;  CDE:  13.17  . COLONOSCOPY    . EYE SURGERY     Bilateral cataracts  . INJECTION KNEE Right 02/20/2016   Procedure: KNEE INJECTION;  Surgeon: [name], MD;  Location: [facility];  Service: Orthopedics;  Laterality: Right;  . JOINT REPLACEMENT    . KNEE ARTHROSCOPY Left   . LAPAROSCOPIC CHOLECYSTECTOMY    . MITRAL VALVE REPAIR  ~ 2003  . MULTIPLE TOOTH EXTRACTIONS    . SHOULDER ARTHROSCOPY WITH ROTATOR CUFF REPAIR AND OPEN BICEPS TENODESIS Right 01/26/2019   Procedure: right shoulder arthroscopy, rotator cuff repair and biceps tenodesis;  Surgeon: [name], MD;  Location: [place];  Service: Orthopedics;  Laterality: Right;  . SHOULDER OPEN ROTATOR CUFF REPAIR Left   . TOTAL KNEE ARTHROPLASTY Left 02/20/2016   Procedure: LEFT TOTAL KNEE ARTHROPLASTY WITH RIGHT KNEE INJECTION;  Surgeon: [name], MD;  Location: [facility];  Service: Orthopedics;  Laterality: Left;  . TOTAL KNEE ARTHROPLASTY Right 07/06/2019   Procedure: RIGHT TOTAL KNEE ARTHROPLASTY;  Surgeon: [name], MD;  Location: [facility];  Service: Orthopedics;  Laterality: Right;  . UPPER GI ENDOSCOPY    . VASECTOMY     Social History   Occupational History  . Occupation:  Environmental manager    Comment: RETIRED  Tobacco Use  . Smoking status:  Former Smoker    Packs/day: 3.00    Years: 35.00    Pack years: 105.00    Types: Cigarettes    Quit date: 02/16/1988    Years since quitting: 31.4  . Smokeless tobacco: Never Used  . Tobacco comment:  Year Quit: 1990   Substance and Sexual Activity  . Alcohol use: No  . Drug use: Never  . Sexual activity: Not Currently

## 2019-07-27 DIAGNOSIS — M1711 Unilateral primary osteoarthritis, right knee: Secondary | ICD-10-CM | POA: Diagnosis not present

## 2019-07-30 DIAGNOSIS — M1711 Unilateral primary osteoarthritis, right knee: Secondary | ICD-10-CM | POA: Diagnosis not present

## 2019-08-01 DIAGNOSIS — M1711 Unilateral primary osteoarthritis, right knee: Secondary | ICD-10-CM | POA: Diagnosis not present

## 2019-08-03 DIAGNOSIS — M1711 Unilateral primary osteoarthritis, right knee: Secondary | ICD-10-CM | POA: Diagnosis not present

## 2019-08-06 ENCOUNTER — Telehealth: Payer: Self-pay | Admitting: *Deleted

## 2019-08-06 DIAGNOSIS — M1711 Unilateral primary osteoarthritis, right knee: Secondary | ICD-10-CM | POA: Diagnosis not present

## 2019-08-06 NOTE — Telephone Encounter (Signed)
Ortho bundle 30 day call completed. °

## 2019-08-08 DIAGNOSIS — M1711 Unilateral primary osteoarthritis, right knee: Secondary | ICD-10-CM | POA: Diagnosis not present

## 2019-08-10 DIAGNOSIS — M1711 Unilateral primary osteoarthritis, right knee: Secondary | ICD-10-CM | POA: Diagnosis not present

## 2019-08-13 DIAGNOSIS — M1711 Unilateral primary osteoarthritis, right knee: Secondary | ICD-10-CM | POA: Diagnosis not present

## 2019-08-15 DIAGNOSIS — M1711 Unilateral primary osteoarthritis, right knee: Secondary | ICD-10-CM | POA: Diagnosis not present

## 2019-08-16 ENCOUNTER — Ambulatory Visit (INDEPENDENT_AMBULATORY_CARE_PROVIDER_SITE_OTHER): Payer: Medicare Other | Admitting: Orthopaedic Surgery

## 2019-08-16 ENCOUNTER — Other Ambulatory Visit: Payer: Self-pay

## 2019-08-16 ENCOUNTER — Encounter: Payer: Self-pay | Admitting: Orthopaedic Surgery

## 2019-08-16 VITALS — BP 139/78 | HR 64 | Ht 71.0 in | Wt 237.0 lb

## 2019-08-16 DIAGNOSIS — Z96651 Presence of right artificial knee joint: Secondary | ICD-10-CM

## 2019-08-16 NOTE — Progress Notes (Signed)
Post-Op Visit Note   Patient: [name]           Date of Birth: 02-03-1936           MRN: [id number] Visit Date: 08/16/2019 PCP: Claretta Fraise, MD   Assessment & Plan: Post right total knee arthroplasty.  He still has quad weakness and cannot do a straight leg raise.  He can ambulate with a cane as long as someone hanging on him and sometime in the postop time.  May have strained his quad muscle.  Sutures are intact there is no knee effusion collateral ligaments are stable x-rays look good.  He will continue working on quad strengthening he is making slow gradual progress.  Recheck 5 weeks.  Chief Complaint:  Chief Complaint  Patient presents with  . Right Knee - Follow-up    07/06/2019 right TKA   Visit Diagnoses:  1. S/P total knee arthroplasty, right     Plan: Continue therapy recheck 5 weeks.  Follow-Up Instructions: Return in about 5 weeks (around 09/20/2019).   Orders:  No orders of the defined types were placed in this encounter.  No orders of the defined types were placed in this encounter.   Imaging: No results found.  PMFS History: Patient Active Problem List   Diagnosis Date Noted  . S/P total knee arthroplasty, right 07/25/2019  . S/P total knee arthroplasty 07/07/2019  . Rotator cuff impingement syndrome of right shoulder 01/26/2019  . Complete tear of right rotator cuff 01/19/2019  . Biceps tendinopathy of right upper extremity 01/19/2019  . Pain in right shoulder 12/06/2018  . TIA (transient ischemic attack) 12/05/2017  . Type 2 diabetes mellitus without complication ([place]) 90/30/0923  . 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 Diplopia 10/24/2017  . S/P total knee arthroplasty, left 04/29/2016  . Weakness of both arms 07/19/2012  . Colon cancer screening 06/22/2012  . Unspecified venous (peripheral) insufficiency 05/05/2012  . Chronic venous insufficiency 04/07/2012  . Other malaise and fatigue 03/16/2012  . Disturbance of skin sensation  03/16/2012  . Carotid stenosis 08/16/2011  . Obesity 08/16/2011  . Coronary artery disease excluded   . H/O mitral valve repair   . Endocarditis, valve unspecified, unspecified cause   . Dizziness 12/16/2010  . Dyslipidemia 09/23/2010  . Essential hypertension 04/06/2007  . Sleep apnea 04/06/2007  . DYSPNEA 04/06/2007   Past Medical History:  Diagnosis Date  . Arthritis    knees, shoulder,  hips (10/24/2017)  . Atrial fibrillation ([place])    on eliquis  . BPH (benign prostatic hyperplasia)   . Cataracts, bilateral    removed by surgery  . Coronary artery disease excluded   . Diabetes mellitus without complication ([place])    type 2  . Diverticulosis   . Endocarditis, valve unspecified, unspecified cause   . GERD (gastroesophageal reflux disease)   . H/O mitral valve repair    Postoperative ring with postoperative atrial fibrillation  . Hearing loss    both ears - does not use hearing aids  . Heart murmur    hx  . Hiatal hernia 06/13/2002  . High cholesterol   . History of kidney stones    passed spontaneously x2   . OSA on CPAP    uses cpap nightly  . Stroke ([place])   . Unspecified essential hypertension   . Weakness of both arms 07/19/2012  . Wears dentures    upper and lower partial    Family History  Problem Relation Age of Onset  .  Congestive Heart Failure Father   . Heart disease Father   . Stroke Brother   . Stroke Sister   . Diabetes Mellitus II Brother   . Diabetes Mellitus II Sister   . Lung cancer Brother   . Stroke Sister   . Stroke Brother   . Colon cancer Neg Hx   . Esophageal cancer Neg Hx   . Rectal cancer Neg Hx   . Stomach cancer Neg Hx     Past Surgical History:  Procedure Laterality Date  . CARDIAC CATHETERIZATION  02/14/2007   x 2  . CARDIOVERSION N/A 04/14/2016   Procedure: CARDIOVERSION;  Surgeon: Minus Breeding, MD;  Location[facility];  Service: Cardiovascular;  Laterality: N/A;  . CATARACT EXTRACTION W/PHACO Right 05/12/2015    Procedure: CATARACT EXTRACTION PHACO AND INTRAOCULAR LENS PLACEMENT RIGHT EYE CDE=7.75;  Surgeon: Tonny Branch, MD;  [facility];  Service: Ophthalmology;  Laterality: Right;  . CATARACT EXTRACTION W/PHACO Left 06/12/2015   Procedure: CATARACT EXTRACTION PHACO AND INTRAOCULAR LENS PLACEMENT (IOC);  Surgeon: Tonny Branch, MD;  [facility];  Service: Ophthalmology;  Laterality: Left;  CDE:  13.17  . COLONOSCOPY    . EYE SURGERY     Bilateral cataracts  . INJECTION KNEE Right 02/20/2016   Procedure: KNEE INJECTION;  Surgeon: [name], MD;  Location: [place];  Service: Orthopedics;  Laterality: Right;  . JOINT REPLACEMENT    . KNEE ARTHROSCOPY Left   . LAPAROSCOPIC CHOLECYSTECTOMY    . MITRAL VALVE REPAIR  ~ 2003  . MULTIPLE TOOTH EXTRACTIONS    . SHOULDER ARTHROSCOPY WITH ROTATOR CUFF REPAIR AND OPEN BICEPS TENODESIS Right 01/26/2019   Procedure: right shoulder arthroscopy, rotator cuff repair and biceps tenodesis;  Surgeon: [name], MD;  Location: [place];  Service: Orthopedics;  Laterality: Right;  . SHOULDER OPEN ROTATOR CUFF REPAIR Left   . TOTAL KNEE ARTHROPLASTY Left 02/20/2016   Procedure: LEFT TOTAL KNEE ARTHROPLASTY WITH RIGHT KNEE INJECTION;  Surgeon: [name], MD;  Location: [place];  Service: Orthopedics;  Laterality: Left;  . TOTAL KNEE ARTHROPLASTY Right 07/06/2019   Procedure: RIGHT TOTAL KNEE ARTHROPLASTY;  Surgeon: [name], MD;  Location: [place];  Service: Orthopedics;  Laterality: Right;  . UPPER GI ENDOSCOPY    . VASECTOMY     Social History   Occupational History  . Occupation:  Environmental manager    Comment: RETIRED  Tobacco Use  . Smoking status: Former Smoker    Packs/day: 3.00    Years: 35.00    Pack years: 105.00    Types: Cigarettes    Quit date: 02/16/1988    Years since quitting: 31.5  . Smokeless tobacco: Never Used  . Tobacco comment:  Year Quit: 1990   Vaping Use  . Vaping Use: Never used  Substance and Sexual Activity  . Alcohol use:  No  . Drug use: Never  . Sexual activity: Not Currently

## 2019-08-17 DIAGNOSIS — M1711 Unilateral primary osteoarthritis, right knee: Secondary | ICD-10-CM | POA: Diagnosis not present

## 2019-08-21 ENCOUNTER — Other Ambulatory Visit: Payer: Self-pay

## 2019-08-21 ENCOUNTER — Other Ambulatory Visit (HOSPITAL_COMMUNITY)
Admission: RE | Admit: 2019-08-21 | Discharge: 2019-08-21 | Disposition: A | Discharge status: Home / Self Care | Payer: Medicare Other | Source: Ambulatory Visit | Attending: Cardiology | Admitting: Cardiology

## 2019-08-21 DIAGNOSIS — Z5181 Encounter for therapeutic drug level monitoring: Secondary | ICD-10-CM | POA: Diagnosis not present

## 2019-08-21 DIAGNOSIS — I4819 Other persistent atrial fibrillation: Secondary | ICD-10-CM | POA: Insufficient documentation

## 2019-08-21 LAB — BASIC METABOLIC PANEL
Anion gap: 10 (ref 5–15)
BUN: 13 mg/dL (ref 8–23)
CO2: 28 mmol/L (ref 22–32)
Calcium: 9.1 mg/dL (ref 8.9–10.3)
Chloride: 103 mmol/L (ref 98–111)
Creatinine, Ser: 0.62 mg/dL (ref 0.61–1.24)
GFR calc Af Amer: >60 mL/min (ref >60)
GFR calc non Af Amer: >60 mL/min (ref >60)
Glucose, Bld: 146 mg/dL — ABNORMAL HIGH (ref 70–99)
Potassium: 3.9 mmol/L (ref 3.5–5.1)
Sodium: 141 mmol/L (ref 135–145)

## 2019-08-21 LAB — CBC
HCT: 43.9 % (ref 39.0–52.0)
Hemoglobin: 14 g/dL (ref 13.0–17.0)
MCH: 31.1 pg (ref 26.0–34.0)
MCHC: 31.9 g/dL (ref 30.0–36.0)
MCV: 97.6 fL (ref 80.0–100.0)
Platelets: 290 10*3/uL (ref 150–400)
RBC: 4.5 MIL/uL (ref 4.22–5.81)
RDW: 13 % (ref 11.5–15.5)
WBC: 8 10*3/uL (ref 4.0–10.5)
nRBC: 0 % (ref 0.0–0.2)

## 2019-08-22 DIAGNOSIS — M1711 Unilateral primary osteoarthritis, right knee: Secondary | ICD-10-CM | POA: Diagnosis not present

## 2019-08-24 DIAGNOSIS — M1711 Unilateral primary osteoarthritis, right knee: Secondary | ICD-10-CM | POA: Diagnosis not present

## 2019-08-27 ENCOUNTER — Ambulatory Visit (INDEPENDENT_AMBULATORY_CARE_PROVIDER_SITE_OTHER): Payer: Medicare Other | Admitting: *Deleted

## 2019-08-27 ENCOUNTER — Other Ambulatory Visit: Payer: Self-pay

## 2019-08-27 DIAGNOSIS — I4891 Unspecified atrial fibrillation: Secondary | ICD-10-CM

## 2019-08-27 DIAGNOSIS — M1711 Unilateral primary osteoarthritis, right knee: Secondary | ICD-10-CM | POA: Diagnosis not present

## 2019-08-27 DIAGNOSIS — Z7901 Long term (current) use of anticoagulants: Secondary | ICD-10-CM

## 2019-08-27 NOTE — Progress Notes (Signed)
Pt was started on Eliquis5mg  twice dailyforatrial fibrillationon 12/15/17.  He denies any problems since starting Eliquis. He denies any bleeding, increased bruising or GI upset.  Had Rt TKR by Dr Lorin Mercy 06/2019.  Progressing well.  Reviewed patients medication list. Pt is notcurrently on any combined P-gp and strong CYP3A4 inhibitors/inducers (ketoconazole, traconazole, ritonavir, carbamazepine, phenytoin, rifampin, St. John's wort). Reviewed labs from7/6/21. SCr 0.62,Weight 104.5kg, Dose isappropriate based on age, weight, and SCr. Hgb and HCT:14.0/43.9 Plts 290kLabs are stable.  A full discussion of the nature of anticoagulants has been carried out. A benefit/risk analysis has been presented to the patient, so that they understand the justification for choosing anticoagulation with Eliquis at this time. The need for compliance is stressed. Pt is aware to take the medication twice daily. Side effects of potential bleeding are discussed, including unusual colored urine or stools, coughing up blood or coffee ground emesis, nose bleeds or serious fall or head trauma. Discussed signs and symptoms of stroke. The patient should avoid any OTC items containing aspirin or ibuprofen. Avoid alcohol consumption. Call if any signs of abnormal bleeding. Discussed financial obligations and resolved any difficulty in obtaining medication.   Results of lab work from7/6/21discussed with patient.6 month follow up appointment made for1/17/2022. Pt will call for lab orders 2 weeks before next appt.

## 2019-08-30 ENCOUNTER — Inpatient Hospital Stay: Payer: Medicare Other | Admitting: Orthopaedic Surgery

## 2019-08-30 ENCOUNTER — Other Ambulatory Visit: Payer: Self-pay

## 2019-08-30 DIAGNOSIS — M1711 Unilateral primary osteoarthritis, right knee: Secondary | ICD-10-CM | POA: Diagnosis not present

## 2019-09-03 DIAGNOSIS — M1711 Unilateral primary osteoarthritis, right knee: Secondary | ICD-10-CM | POA: Diagnosis not present

## 2019-09-06 DIAGNOSIS — M1711 Unilateral primary osteoarthritis, right knee: Secondary | ICD-10-CM | POA: Diagnosis not present

## 2019-09-10 DIAGNOSIS — M1711 Unilateral primary osteoarthritis, right knee: Secondary | ICD-10-CM | POA: Diagnosis not present

## 2019-09-12 ENCOUNTER — Other Ambulatory Visit: Payer: Self-pay | Admitting: Family Medicine

## 2019-09-13 ENCOUNTER — Encounter: Payer: Self-pay | Admitting: Orthopaedic Surgery

## 2019-09-13 ENCOUNTER — Other Ambulatory Visit: Payer: Self-pay

## 2019-09-13 ENCOUNTER — Ambulatory Visit (INDEPENDENT_AMBULATORY_CARE_PROVIDER_SITE_OTHER): Payer: Medicare Other | Admitting: Orthopaedic Surgery

## 2019-09-13 VITALS — BP 121/75 | Ht 71.0 in | Wt 230.0 lb

## 2019-09-13 DIAGNOSIS — M1711 Unilateral primary osteoarthritis, right knee: Secondary | ICD-10-CM | POA: Diagnosis not present

## 2019-09-13 DIAGNOSIS — Z96651 Presence of right artificial knee joint: Secondary | ICD-10-CM

## 2019-09-13 NOTE — Progress Notes (Signed)
Post-Op Visit Note   Patient: [name]           Date of Birth: 08/18/1935           MRN: [id number] Visit Date: 09/13/2019 PCP: Claretta Fraise, MD   Assessment & Plan: Post right total knee arthroplasty.  Lacks a few degrees full extension flexion is 110.  He is on Eliquis still has some swelling in his knee.  He has had some discomfort lumbosacral junction on the right side that radiates in his hip down to his knee.  This should improve as he walks and makes progress with quad strengthening and with knee extension.  Previous x-rays show multilevel disc space narrowing and spurring with disc degeneration.  I will recheck him in 2 months.  Chief Complaint:  Chief Complaint  Patient presents with   Right Knee - Follow-up    07/06/2019 Right TKA   Visit Diagnoses:  1. S/P total knee arthroplasty, right     Plan: continue PT , ROV 2 months. He will work on extension in prone position  Follow-Up Instructions: No follow-ups on file.   Orders:  No orders of the defined types were placed in this encounter.  No orders of the defined types were placed in this encounter.   Imaging: No results found.  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   Diplopia 10/24/2017   S/P total knee arthroplasty, left 04/29/2016   Weakness of both arms 07/19/2012   Colon cancer screening 06/22/2012   Unspecified venous (peripheral) insufficiency 05/05/2012   Chronic venous insufficiency 04/07/2012   Other  malaise and fatigue 03/16/2012   Disturbance of skin sensation 03/16/2012   Carotid stenosis 08/16/2011   Obesity 08/16/2011   Coronary artery disease excluded    H/O mitral valve repair    Endocarditis, valve unspecified, unspecified cause    Dizziness 12/16/2010   Dyslipidemia 09/23/2010   Essential hypertension 04/06/2007   Sleep apnea 04/06/2007   DYSPNEA 04/06/2007   Past Medical History:  Diagnosis Date   Arthritis    knees, shoulder,  hips (10/24/2017)   Atrial fibrillation (HCC)    on eliquis   BPH (benign prostatic hyperplasia)    Cataracts, bilateral    removed by surgery   Coronary artery disease excluded    Diabetes mellitus without complication ([place])    type 2   Diverticulosis    Endocarditis, valve unspecified, unspecified cause    GERD (gastroesophageal reflux disease)    H/O mitral valve repair    Postoperative ring with postoperative atrial fibrillation   Hearing loss    both ears - does not use hearing aids   Heart murmur    hx   Hiatal hernia 06/13/2002   High cholesterol    History of kidney stones    passed spontaneously x2    OSA on CPAP    uses cpap nightly   Stroke ([place])    Unspecified essential hypertension    Weakness of both arms 07/19/2012   Wears dentures    upper and  lower partial    Family History  Problem Relation Age of Onset   Congestive Heart Failure Father    Heart disease Father    Stroke Brother    Stroke Sister    Diabetes Mellitus II Brother    Diabetes Mellitus II Sister    Lung cancer Brother    Stroke Sister    Stroke Brother    Colon cancer Neg Hx    Esophageal cancer Neg Hx    Rectal cancer Neg Hx    Stomach cancer Neg Hx     Past Surgical History:  Procedure Laterality Date   CARDIAC CATHETERIZATION  02/14/2007   x 2   CARDIOVERSION N/A 04/14/2016   Procedure: CARDIOVERSION;  Surgeon: Minus Breeding, MD;  Location: [facility] ENDOSCOPY;  Service: Cardiovascular;   Laterality: N/A;   CATARACT EXTRACTION W/PHACO Right 05/12/2015   Procedure: CATARACT EXTRACTION PHACO AND INTRAOCULAR LENS PLACEMENT RIGHT EYE CDE=7.75;  Surgeon: Tonny Branch, MD;  Location: [facility];  Service: Ophthalmology;  Laterality: Right;   CATARACT EXTRACTION W/PHACO Left 06/12/2015   Procedure: CATARACT EXTRACTION PHACO AND INTRAOCULAR LENS PLACEMENT (IOC);  Surgeon: Tonny Branch, MD;  Location: [facility];  Service: Ophthalmology;  Laterality: Left;  CDE:  13.17   COLONOSCOPY     EYE SURGERY     Bilateral cataracts   INJECTION KNEE Right 02/20/2016   Procedure: KNEE INJECTION;  Surgeon: [name], MD;  Location: [place];  Service: Orthopedics;  Laterality: Right;   JOINT REPLACEMENT     KNEE ARTHROSCOPY Left    LAPAROSCOPIC CHOLECYSTECTOMY     MITRAL VALVE REPAIR  ~ 2003   MULTIPLE TOOTH EXTRACTIONS     SHOULDER ARTHROSCOPY WITH ROTATOR CUFF REPAIR AND OPEN BICEPS TENODESIS Right 01/26/2019   Procedure: right shoulder arthroscopy, rotator cuff repair and biceps tenodesis;  Surgeon: [name], MD;  Location: [place];  Service: Orthopedics;  Laterality: Right;   SHOULDER OPEN ROTATOR CUFF REPAIR Left    TOTAL KNEE ARTHROPLASTY Left 02/20/2016   Procedure: LEFT TOTAL KNEE ARTHROPLASTY WITH RIGHT KNEE INJECTION;  Surgeon: [name], MD;  Location: [facility];  Service: Orthopedics;  Laterality: Left;   TOTAL KNEE ARTHROPLASTY Right 07/06/2019   Procedure: RIGHT TOTAL KNEE ARTHROPLASTY;  Surgeon: [name], MD;  Location: [facility];  Service: Orthopedics;  Laterality: Right;   UPPER GI ENDOSCOPY     VASECTOMY     Social History   Occupational History   Occupation:  Environmental manager    Comment: RETIRED  Tobacco Use   Smoking status: Former Smoker    Packs/day: 3.00    Years: 35.00    Pack years: 105.00    Types: Cigarettes    Quit date: 02/16/1988    Years since quitting: 31.5   Smokeless tobacco: Never Used   Tobacco comment:  Year Quit: 1990   Vaping Use    Vaping Use: Never used  Substance and Sexual Activity   Alcohol use: No   Drug use: Never   Sexual activity: Not Currently

## 2019-09-14 ENCOUNTER — Other Ambulatory Visit: Payer: Self-pay | Admitting: Family Medicine

## 2019-09-17 ENCOUNTER — Telehealth: Payer: Self-pay | Admitting: Family Medicine

## 2019-09-17 NOTE — Telephone Encounter (Signed)
Last a1c 6.6. Please give direction to come off blood thinner? Cover pcp please advise

## 2019-09-17 NOTE — Telephone Encounter (Signed)
Stop blood thinner 5 day sprio to dental procedure and stat it back after appointment for procedure

## 2019-09-18 DIAGNOSIS — M1711 Unilateral primary osteoarthritis, right knee: Secondary | ICD-10-CM | POA: Diagnosis not present

## 2019-09-21 DIAGNOSIS — R509 Fever, unspecified: Secondary | ICD-10-CM | POA: Diagnosis not present

## 2019-09-21 DIAGNOSIS — M1711 Unilateral primary osteoarthritis, right knee: Secondary | ICD-10-CM | POA: Diagnosis not present

## 2019-09-21 DIAGNOSIS — B349 Viral infection, unspecified: Secondary | ICD-10-CM | POA: Diagnosis not present

## 2019-10-01 ENCOUNTER — Encounter: Payer: Self-pay | Admitting: Family Medicine

## 2019-10-01 ENCOUNTER — Other Ambulatory Visit: Payer: Self-pay

## 2019-10-01 ENCOUNTER — Ambulatory Visit (INDEPENDENT_AMBULATORY_CARE_PROVIDER_SITE_OTHER): Payer: Medicare Other | Admitting: Family Medicine

## 2019-10-01 VITALS — BP 135/76 | HR 73 | Temp 97.1°F | Resp 20 | Ht 71.0 in | Wt 236.2 lb

## 2019-10-01 DIAGNOSIS — I10 Essential (primary) hypertension: Secondary | ICD-10-CM

## 2019-10-01 DIAGNOSIS — E785 Hyperlipidemia, unspecified: Secondary | ICD-10-CM | POA: Diagnosis not present

## 2019-10-01 DIAGNOSIS — E119 Type 2 diabetes mellitus without complications: Secondary | ICD-10-CM | POA: Diagnosis not present

## 2019-10-01 DIAGNOSIS — E6609 Other obesity due to excess calories: Secondary | ICD-10-CM | POA: Diagnosis not present

## 2019-10-01 DIAGNOSIS — Z96651 Presence of right artificial knee joint: Secondary | ICD-10-CM | POA: Diagnosis not present

## 2019-10-01 DIAGNOSIS — Z6832 Body mass index (BMI) 32.0-32.9, adult: Secondary | ICD-10-CM

## 2019-10-01 DIAGNOSIS — I739 Peripheral vascular disease, unspecified: Secondary | ICD-10-CM

## 2019-10-01 LAB — CMP14+EGFR
ALT: 20 IU/L (ref 0–44)
AST: 19 IU/L (ref 0–40)
Albumin/Globulin Ratio: 1.5 (ref 1.2–2.2)
Albumin: 4.3 g/dL (ref 3.6–4.6)
Alkaline Phosphatase: 86 IU/L (ref 48–121)
BUN/Creatinine Ratio: 14 (ref 10–24)
BUN: 10 mg/dL (ref 8–27)
Bilirubin Total: 0.7 mg/dL (ref 0.0–1.2)
CO2: 26 mmol/L (ref 20–29)
Calcium: 9.5 mg/dL (ref 8.6–10.2)
Chloride: 99 mmol/L (ref 96–106)
Creatinine, Ser: 0.74 mg/dL — ABNORMAL LOW (ref 0.76–1.27)
GFR calc Af Amer: 100 mL/min/{1.73_m2} (ref >59)
GFR calc non Af Amer: 87 mL/min/{1.73_m2} (ref >59)
Globulin, Total: 2.8 g/dL (ref 1.5–4.5)
Glucose: 132 mg/dL — ABNORMAL HIGH (ref 65–99)
Potassium: 4.3 mmol/L (ref 3.5–5.2)
Sodium: 140 mmol/L (ref 134–144)
Total Protein: 7.1 g/dL (ref 6.0–8.5)

## 2019-10-01 LAB — LIPID PANEL
Chol/HDL Ratio: 2.7 ratio (ref 0.0–5.0)
Cholesterol, Total: 121 mg/dL (ref 100–199)
HDL: 45 mg/dL (ref >39)
LDL Chol Calc (NIH): 52 mg/dL (ref 0–99)
Triglycerides: 138 mg/dL (ref 0–149)
VLDL Cholesterol Cal: 24 mg/dL (ref 5–40)

## 2019-10-01 LAB — CBC WITH DIFFERENTIAL/PLATELET
Basophils Absolute: 0 10*3/uL (ref 0.0–0.2)
Basos: 0 %
EOS (ABSOLUTE): 0.1 10*3/uL (ref 0.0–0.4)
Eos: 2 %
Hematocrit: 43 % (ref 37.5–51.0)
Hemoglobin: 13.9 g/dL (ref 13.0–17.7)
Immature Grans (Abs): 0 10*3/uL (ref 0.0–0.1)
Immature Granulocytes: 1 %
Lymphocytes Absolute: 1.8 10*3/uL (ref 0.7–3.1)
Lymphs: 27 %
MCH: 29.6 pg (ref 26.6–33.0)
MCHC: 32.3 g/dL (ref 31.5–35.7)
MCV: 92 fL (ref 79–97)
Monocytes Absolute: 0.6 10*3/uL (ref 0.1–0.9)
Monocytes: 8 %
Neutrophils Absolute: 4.2 10*3/uL (ref 1.4–7.0)
Neutrophils: 62 %
Platelets: 307 10*3/uL (ref 150–450)
RBC: 4.69 x10E6/uL (ref 4.14–5.80)
RDW: 12.8 % (ref 11.6–15.4)
WBC: 6.8 10*3/uL (ref 3.4–10.8)

## 2019-10-01 LAB — BAYER DCA HB A1C WAIVED: HB A1C (BAYER DCA - WAIVED): 6.6 % (ref <7.0)

## 2019-10-01 NOTE — Progress Notes (Signed)
Subjective:  Patient ID: [name], male    DOB: 08/11/1935  Age: 84 y.o. MRN: [id number]  CC: Medical Management of Chronic Issues   HPI [name] presents forFollow-up of diabetes. Patient does not check blood sugar at home.    Patient denies symptoms such as polyuria, polydipsia, excessive hunger, nausea No significant hypoglycemic spells noted. Medications reviewed. Pt reports taking them regularly without complication/adverse reaction being reported today.  Checking feet daily. Has an eye appointment set up for next week  History [name] has a past medical history of Arthritis, Atrial fibrillation ([place]), BPH (benign prostatic hyperplasia), Cataracts, bilateral, Coronary artery disease excluded, Diabetes mellitus without complication ([place]), Diverticulosis, Endocarditis, valve unspecified, unspecified cause, GERD (gastroesophageal reflux disease), H/O mitral valve repair, Hearing loss, Heart murmur, Hiatal hernia (06/13/2002), High cholesterol, History of kidney stones, OSA on CPAP, Stroke ([place]), Unspecified essential hypertension, Weakness of both arms (07/19/2012), and Wears dentures.   He has a past surgical history that includes Vasectomy; Mitral valve repair (~ 2003); Cataract extraction w/PHACO (Right, 05/12/2015); Cataract extraction w/PHACO (Left, 06/12/2015); Total knee arthroplasty (Left, 02/20/2016); Injection knee (Right, 02/20/2016); Cardioversion (N/A, 04/14/2016); Laparoscopic cholecystectomy; Knee arthroscopy (Left); Joint replacement; Shoulder open rotator cuff repair (Left); Colonoscopy; Upper gi endoscopy; Multiple tooth extractions; Cardiac catheterization (02/14/2007); Shoulder arthroscopy with rotator cuff repair and open biceps tenodesis (Right, 01/26/2019); Eye surgery; and Total knee arthroplasty (Right, 07/06/2019).   His family history includes Congestive Heart Failure in his father; Diabetes Mellitus II in his brother and sister; Heart disease in his father;  Lung cancer in his brother; Stroke in his brother, brother, sister, and sister.He reports that he quit smoking about 31 years ago. His smoking use included cigarettes. He has a 105.00 pack-year smoking history. He has never used smokeless tobacco. He reports that he does not drink alcohol and does not use drugs.  Current Outpatient Medications on File Prior to Visit  Medication Sig Dispense Refill  . amLODipine (NORVASC) 5 MG tablet TAKE 1 TABLET BY MOUTH EVERY DAY 90 tablet 0  . apixaban (ELIQUIS) 5 MG TABS tablet Take 1 tablet (5 mg total) by mouth 2 (two) times daily. 180 tablet 1  . Calcium Carb-Cholecalciferol (CALCIUM 600 + D PO) Take 1 tablet by mouth daily.    . Coenzyme Q10 300 MG CAPS Take 300 mg by mouth daily.    Marland Kitchen lisinopril (ZESTRIL) 20 MG tablet Take 1 tablet (20 mg total) by mouth at bedtime. 90 tablet 1  . metFORMIN (GLUCOPHAGE-XR) 500 MG 24 hr tablet TAKE 1 TABLET BY MOUTH EVERY DAY WITH BREAKFAST (Patient taking differently: Take 500 mg by mouth daily. TAKE 1 TABLET BY MOUTH EVERY DAY WITH BREAKFAST) 90 tablet 1  . methocarbamol (ROBAXIN) 500 MG tablet Take 1 tablet (500 mg total) by mouth every 6 (six) hours as needed for muscle spasms. 60 tablet 0  . Multiple Vitamin (MULTIVITAMIN WITH MINERALS) TABS tablet Take 1 tablet by mouth daily.    Marland Kitchen omeprazole (PRILOSEC) 20 MG capsule TAKE 1 CAPSULE BY MOUTH EVERY DAY 90 capsule 0  . rosuvastatin (CRESTOR) 5 MG tablet Take 1 tablet (5 mg total) by mouth daily. For cholesterol (Patient taking differently: Take 5 mg by mouth every evening. For cholesterol) 90 tablet 3   No current facility-administered medications on file prior to visit.    ROS Review of Systems  Constitutional: Negative for fever.  Respiratory: Negative for shortness of breath.   Cardiovascular: Positive for leg swelling (Bilaterally.  Mostly on  the right from his recent surgery, but still having some on the left.). Negative for chest pain.  Musculoskeletal:  Positive for arthralgias (Recent right knee replacement.  Still having some pain posteriorly in the hamstring area) and joint swelling (Right knee).  Skin: Negative for rash.    Objective:  BP 135/76   Pulse 73   Temp (!) 97.1 F (36.2 C) (Temporal)   Resp 20   Ht _0  (1.803 m)   Wt 236 lb 4 oz (107.2 kg)   SpO2 98%   BMI 32.95 kg/m   BP Readings from Last 3 Encounters:  10/01/19 135/76  09/13/19 121/75  08/16/19 139/78    Wt Readings from Last 3 Encounters:  10/01/19 236 lb 4 oz (107.2 kg)  09/13/19 (!) 230 lb (104.3 kg)  08/16/19 237 lb (107.5 kg)     Physical Exam Vitals reviewed.  Constitutional:      Appearance: He is well-developed.  HENT:     Head: Normocephalic and atraumatic.     Right Ear: Tympanic membrane and external ear normal. No decreased hearing noted.     Left Ear: Tympanic membrane and external ear normal. No decreased hearing noted.     Mouth/Throat:     Pharynx: No oropharyngeal exudate or posterior oropharyngeal erythema.  Eyes:     Pupils: Pupils are equal, round, and reactive to light.  Cardiovascular:     Rate and Rhythm: Normal rate and regular rhythm.     Heart sounds: No murmur heard.   Pulmonary:     Effort: No respiratory distress.     Breath sounds: Normal breath sounds.  Abdominal:     General: Bowel sounds are normal.     Palpations: Abdomen is soft. There is no mass.     Tenderness: There is no abdominal tenderness.  Musculoskeletal:     Cervical back: Normal range of motion and neck supple.     Right lower leg: Edema (2+, mostly at knee) present.  Skin:    General: Skin is warm.       Assessment & Plan:   [name] was seen today for medical management of chronic issues.  Diagnoses and all orders for this visit:  Type 2 diabetes mellitus without complication, without long-term current use of insulin (HCC) -     Bayer DCA Hb A1c Waived -     Microalbumin / creatinine urine ratio -     CBC with  Differential/Platelet -     CMP14+EGFR -     Lipid panel  Essential hypertension -     CBC with Differential/Platelet -     CMP14+EGFR -     Lipid panel  Dyslipidemia -     CBC with Differential/Platelet -     CMP14+EGFR -     Lipid panel  Class 1 obesity due to excess calories with serious comorbidity and body mass index (BMI) of 32.0 to 32.9 in adult  S/P total knee arthroplasty, right  PVD (peripheral vascular disease) ([place])      I have discontinued Stefan A. Karrer's oxyCODONE-acetaminophen. I am also having him maintain his multivitamin with minerals, Calcium Carb-Cholecalciferol (CALCIUM 600 + D PO), Coenzyme Q10, metFORMIN, lisinopril, apixaban, rosuvastatin, methocarbamol, omeprazole, and amLODipine.  No orders of the defined types were placed in this encounter.    Follow-up: No follow-ups on file.  Claretta Fraise, M.D.

## 2019-10-02 LAB — MICROALBUMIN / CREATININE URINE RATIO
Creatinine, Urine: 53.4 mg/dL
Microalb/Creat Ratio: 17 mg/g creat (ref 0–29)
Microalbumin, Urine: 8.9 ug/mL

## 2019-10-03 DIAGNOSIS — M1711 Unilateral primary osteoarthritis, right knee: Secondary | ICD-10-CM | POA: Diagnosis not present

## 2019-10-03 NOTE — Progress Notes (Signed)
Hello [place],  Your lab result is normal and/or stable.Some minor variations that are not significant are commonly marked abnormal, but do not represent any medical problem for you.  Best regards, Masin Shatto, M.D.

## 2019-10-09 ENCOUNTER — Telehealth: Payer: Self-pay | Admitting: *Deleted

## 2019-10-09 NOTE — Telephone Encounter (Signed)
Ortho bundle 90 day call completed. 

## 2019-10-12 DIAGNOSIS — G4733 Obstructive sleep apnea (adult) (pediatric): Secondary | ICD-10-CM | POA: Diagnosis not present

## 2019-10-25 DIAGNOSIS — D225 Melanocytic nevi of trunk: Secondary | ICD-10-CM | POA: Diagnosis not present

## 2019-10-25 DIAGNOSIS — Z08 Encounter for follow-up examination after completed treatment for malignant neoplasm: Secondary | ICD-10-CM | POA: Diagnosis not present

## 2019-10-25 DIAGNOSIS — Z85828 Personal history of other malignant neoplasm of skin: Secondary | ICD-10-CM | POA: Diagnosis not present

## 2019-10-25 DIAGNOSIS — Z1283 Encounter for screening for malignant neoplasm of skin: Secondary | ICD-10-CM | POA: Diagnosis not present

## 2019-11-01 ENCOUNTER — Encounter: Payer: Self-pay | Admitting: Orthopaedic Surgery

## 2019-11-01 ENCOUNTER — Other Ambulatory Visit: Payer: Self-pay

## 2019-11-01 ENCOUNTER — Ambulatory Visit (INDEPENDENT_AMBULATORY_CARE_PROVIDER_SITE_OTHER): Payer: Medicare Other | Admitting: Orthopaedic Surgery

## 2019-11-01 VITALS — BP 134/84 | Ht 71.0 in | Wt 236.0 lb

## 2019-11-01 DIAGNOSIS — Z96651 Presence of right artificial knee joint: Secondary | ICD-10-CM

## 2019-11-01 MED ORDER — LIDOCAINE HCL 1 % IJ SOLN
0.5000 mL | INTRAMUSCULAR | Status: AC | PRN
  Administered 2019-11-01: .5 mL

## 2019-11-01 NOTE — Progress Notes (Signed)
Office Visit Note   Patient: [name]           Date of Birth: Aug 04, 1935           MRN: [id number] Visit Date: 11/01/2019              Requested by: Claretta Fraise, MD [place],  [place] [id number] PCP: Claretta Fraise, MD   Assessment & Plan: Visit Diagnoses:  1. S/P total knee arthroplasty, right     Plan: I was concerned patient had hemarthrosis with his Eliquis.  Attempted aspiration only had 1 cc blood-tinged fluid he did not have significant swelling in his knee several passes were made.  Patient back to wearing the long TED hose that he has to help with the swelling continue working out at the gym with knee extension exercises.  I will check him again in 1 month.  Follow-Up Instructions: No follow-ups on file.   Orders:  Orders Placed This Encounter  Procedures  . Large Joint Inj   No orders of the defined types were placed in this encounter.     Procedures: Large Joint Inj: R knee on 11/01/2019 2:37 PM Indications: pain and joint swelling Details: 22 G 1.5 in needle, anterolateral approach  Arthrogram: No  Medications: 0.5 mL lidocaine 1 % Aspirate: 1 mL blood-tinged Outcome: tolerated well, no immediate complications Procedure, treatment alternatives, risks and benefits explained, specific risks discussed. Consent was given by the patient. Immediately prior to procedure a time out was called to verify the correct patient, procedure, equipment, support staff and site/side marked as required. Patient was prepped and draped in the usual sterile fashion.       Clinical Data: No additional findings.   Subjective: Chief Complaint  Patient presents with  . Right Knee - Follow-up    07/06/2019 Right TKA    HPI Postop total knee arthroplasty right knee 07/06/2019 this right knee is moving much slower than his previous left knee did.  He has had difficulty with walking still limping still has quad weakness has had pain in the popliteal region with  some swelling and has little bit more leg swelling than he did on the opposite knee.  He has had a Doppler test which was negative for DVTs taking Tylenol.  He is amatory without a cane.  Patient is on chronic Eliquis. Review of Systems updated unchanged.   Objective: Vital Signs: BP 134/84   Ht 5\' 11"  (1.803 m)   Wt 236 lb (107 kg)   BMI 32.92 kg/m   Physical Exam Constitutional:      Appearance: He is well-developed.  HENT:     Head: Normocephalic and atraumatic.  Eyes:     Pupils: Pupils are equal, round, and reactive to light.  Neck:     Thyroid: No thyromegaly.     Trachea: No tracheal deviation.  Cardiovascular:     Rate and Rhythm: Normal rate.  Pulmonary:     Effort: Pulmonary effort is normal.     Breath sounds: No wheezing.  Abdominal:     General: Bowel sounds are normal.     Palpations: Abdomen is soft.  Skin:    General: Skin is warm and dry.     Capillary Refill: Capillary refill takes less than 2 seconds.  Neurological:     Mental Status: He is alert and oriented to person, place, and time.  Psychiatric:        Behavior: Behavior normal.  Thought Content: Thought content normal.        Judgment: Judgment normal.     Ortho Exam patient appears to have knee effusion or capsular thickening.  Some prominence in the popliteal region there is some pitting edema right lower extremity.  Specialty Comments:  No specialty comments available.  Imaging: No results found.   PMFS History: Patient Active Problem List   Diagnosis Date Noted  . S/P total knee arthroplasty, right 07/25/2019  . Rotator cuff impingement syndrome of right shoulder 01/26/2019  . Complete tear of right rotator cuff 01/19/2019  . Biceps tendinopathy of right upper extremity 01/19/2019  . Pain in right shoulder 12/06/2018  . TIA (transient ischemic attack) 12/05/2017  . Type 2 diabetes mellitus without complication ([place]) 42/68/3419  . 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  Diplopia 10/24/2017  . S/P total knee arthroplasty, left 04/29/2016  . Weakness of both arms 07/19/2012  . Colon cancer screening 06/22/2012  . Unspecified venous (peripheral) insufficiency 05/05/2012  . Chronic venous insufficiency 04/07/2012  . Other malaise and fatigue 03/16/2012  . Disturbance of skin sensation 03/16/2012  . Carotid stenosis 08/16/2011  . Obesity 08/16/2011  . H/O mitral valve repair   . Dyslipidemia 09/23/2010  . Essential hypertension 04/06/2007  . Sleep apnea 04/06/2007   Past Medical History:  Diagnosis Date  . Arthritis    knees, shoulder,  hips (10/24/2017)  . Atrial fibrillation ([place])    on eliquis  . BPH (benign prostatic hyperplasia)   . Cataracts, bilateral    removed by surgery  . Coronary artery disease excluded   . Diabetes mellitus without complication ([place])    type 2  . Diverticulosis   . Endocarditis, valve unspecified, unspecified cause   . GERD (gastroesophageal reflux disease)   . H/O mitral valve repair    Postoperative ring with postoperative atrial fibrillation  . Hearing loss    both ears - does not use hearing aids  . Heart murmur    hx  . Hiatal hernia 06/13/2002  . High cholesterol   . History of kidney stones    passed spontaneously x2   . OSA on CPAP    uses cpap nightly  . Stroke ([place])   . Unspecified essential hypertension   . Weakness of both arms 07/19/2012  . Wears dentures    upper and lower partial    Family History  Problem Relation Age of Onset  . Congestive Heart Failure Father   . Heart disease Father   . Stroke Brother   . Stroke Sister   . Diabetes Mellitus II Brother   . Diabetes Mellitus II Sister   . Lung cancer Brother   . Stroke Sister   . Stroke Brother   . Colon cancer Neg Hx   . Esophageal cancer Neg Hx   . Rectal cancer Neg Hx   . Stomach cancer Neg Hx     Past Surgical History:  Procedure Laterality Date  . CARDIAC CATHETERIZATION  02/14/2007   x 2  . CARDIOVERSION N/A 04/14/2016    Procedure: CARDIOVERSION;  Surgeon: Minus Breeding, MD;  Location: [facility] ENDOSCOPY;  Service: Cardiovascular;  Laterality: N/A;  . CATARACT EXTRACTION W/PHACO Right 05/12/2015   Procedure: CATARACT EXTRACTION PHACO AND INTRAOCULAR LENS PLACEMENT RIGHT EYE CDE=7.75;  Surgeon: Tonny Branch, MD;  [facility];  Service: Ophthalmology;  Laterality: Right;  . CATARACT EXTRACTION W/PHACO Left 06/12/2015   Procedure: CATARACT EXTRACTION PHACO AND INTRAOCULAR LENS PLACEMENT (IOC);  Surgeon: Tonny Branch, MD;  [facility];  Service: Ophthalmology;  Laterality: Left;  CDE:  13.17  . COLONOSCOPY    . EYE SURGERY     Bilateral cataracts  . INJECTION KNEE Right 02/20/2016   Procedure: KNEE INJECTION;  Surgeon: [name], MD;  Location: [facility];  Service: Orthopedics;  Laterality: Right;  . JOINT REPLACEMENT    . KNEE ARTHROSCOPY Left   . LAPAROSCOPIC CHOLECYSTECTOMY    . MITRAL VALVE REPAIR  ~ 2003  . MULTIPLE TOOTH EXTRACTIONS    . SHOULDER ARTHROSCOPY WITH ROTATOR CUFF REPAIR AND OPEN BICEPS TENODESIS Right 01/26/2019   Procedure: right shoulder arthroscopy, rotator cuff repair and biceps tenodesis;  Surgeon: [name], MD;  Location: [facility];  Service: Orthopedics;  Laterality: Right;  . SHOULDER OPEN ROTATOR CUFF REPAIR Left   . TOTAL KNEE ARTHROPLASTY Left 02/20/2016   Procedure: LEFT TOTAL KNEE ARTHROPLASTY WITH RIGHT KNEE INJECTION;  Surgeon: [name], MD;  Location: [facility];  Service: Orthopedics;  Laterality: Left;  . TOTAL KNEE ARTHROPLASTY Right 07/06/2019   Procedure: RIGHT TOTAL KNEE ARTHROPLASTY;  Surgeon: [name], MD;  Location: [facility];  Service: Orthopedics;  Laterality: Right;  . UPPER GI ENDOSCOPY    . VASECTOMY     Social History   Occupational History  . Occupation:  Environmental manager    Comment: RETIRED  Tobacco Use  . Smoking status: Former Smoker    Packs/day: 3.00    Years: 35.00    Pack years: 105.00    Types: Cigarettes    Quit date: 02/16/1988    Years since  quitting: 31.7  . Smokeless tobacco: Never Used  . Tobacco comment:  Year Quit: 1990   Vaping Use  . Vaping Use: Never used  Substance and Sexual Activity  . Alcohol use: No  . Drug use: Never  . Sexual activity: Not Currently

## 2019-11-15 ENCOUNTER — Other Ambulatory Visit: Payer: Self-pay | Admitting: Family Medicine

## 2019-11-24 ENCOUNTER — Other Ambulatory Visit: Payer: Self-pay | Admitting: Family Medicine

## 2019-11-29 ENCOUNTER — Ambulatory Visit (INDEPENDENT_AMBULATORY_CARE_PROVIDER_SITE_OTHER): Payer: Medicare Other | Admitting: Orthopaedic Surgery

## 2019-11-29 ENCOUNTER — Encounter: Payer: Self-pay | Admitting: Orthopaedic Surgery

## 2019-11-29 VITALS — Ht 71.0 in | Wt 236.0 lb

## 2019-11-29 DIAGNOSIS — Z96651 Presence of right artificial knee joint: Secondary | ICD-10-CM | POA: Diagnosis not present

## 2019-11-29 NOTE — Progress Notes (Signed)
Office Visit Note   Patient: [name]           Date of Birth: 1935-03-14           MRN: [id number] Visit Date: 11/29/2019              Requested by: Claretta Fraise, MD [place],  [place] [id number] PCP: Claretta Fraise, MD   Assessment & Plan: Visit Diagnoses:  1. S/P total knee arthroplasty, right     Plan: Recheck 3 months.  Continue workout activities.  Follow-Up Instructions: Return in about 3 months (around 02/29/2020).   Orders:  No orders of the defined types were placed in this encounter.  No orders of the defined types were placed in this encounter.     Procedures: No procedures performed   Clinical Data: No additional findings.   Subjective: Chief Complaint  Patient presents with  . Right Knee - Follow-up    07/06/2019 Right TKA   HPI; 84 year old male returns post right total knee arthroplasty 07/06/2019.  Previous aspiration attempt negative for fluid.  X-rays look good.  He has full extension good flexion but is having pain primarily posteriorly lateral hamstring.  He has trouble getting comfortable.  No groin pain.  He has completed formal therapy going to the gym with home exercise program and is using occasional Tylenol extra strength.  He is using compressive teds thigh length.  No giving way.   Review of Systems unchanged from 916/21.   Objective: Vital Signs: Ht 5\' 11"  (1.803 m)   Wt 236 lb (107 kg)   BMI 32.92 kg/m   Physical Exam Constitutional:      Appearance: He is well-developed.  HENT:     Head: Normocephalic and atraumatic.  Eyes:     Pupils: Pupils are equal, round, and reactive to light.  Neck:     Thyroid: No thyromegaly.     Trachea: No tracheal deviation.  Cardiovascular:     Rate and Rhythm: Normal rate.  Pulmonary:     Effort: Pulmonary effort is normal.     Breath sounds: No wheezing.  Abdominal:     General: Bowel sounds are normal.     Palpations: Abdomen is soft.  Skin:    General: Skin is warm  and dry.     Capillary Refill: Capillary refill takes less than 2 seconds.  Neurological:     Mental Status: He is alert and oriented to person, place, and time.  Psychiatric:        Behavior: Behavior normal.        Thought Content: Thought content normal.        Judgment: Judgment normal.     Ortho Exam no knee effusion full extension good quad strength negative logroll hips.  Patient has some tenderness over the lateral hamstring peroneals normal.  Minimal sciatic notch tenderness.  EHL anterior tib gastrocsoleus is strong. Specialty Comments:  No specialty comments available.  Imaging: No results found.   PMFS History: Patient Active Problem List   Diagnosis Date Noted  . S/P total knee arthroplasty, right 07/25/2019  . Rotator cuff impingement syndrome of right shoulder 01/26/2019  . Complete tear of right rotator cuff 01/19/2019  . Biceps tendinopathy of right upper extremity 01/19/2019  . Pain in right shoulder 12/06/2018  . TIA (transient ischemic attack) 12/05/2017  . Type 2 diabetes mellitus without complication ([place]) 78/58/8502  . 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 Diplopia 10/24/2017  . S/P total  knee arthroplasty, left 04/29/2016  . Weakness of both arms 07/19/2012  . Colon cancer screening 06/22/2012  . Unspecified venous (peripheral) insufficiency 05/05/2012  . Chronic venous insufficiency 04/07/2012  . Other malaise and fatigue 03/16/2012  . Disturbance of skin sensation 03/16/2012  . Carotid stenosis 08/16/2011  . Obesity 08/16/2011  . H/O mitral valve repair   . Dyslipidemia 09/23/2010  . Essential hypertension 04/06/2007  . Sleep apnea 04/06/2007   Past Medical History:  Diagnosis Date  . Arthritis    knees, shoulder,  hips (10/24/2017)  . Atrial fibrillation ([place])    on eliquis  . BPH (benign prostatic hyperplasia)   . Cataracts, bilateral    removed by surgery  . Coronary artery disease excluded   . Diabetes mellitus without complication  ([place])    type 2  . Diverticulosis   . Endocarditis, valve unspecified, unspecified cause   . GERD (gastroesophageal reflux disease)   . H/O mitral valve repair    Postoperative ring with postoperative atrial fibrillation  . Hearing loss    both ears - does not use hearing aids  . Heart murmur    hx  . Hiatal hernia 06/13/2002  . High cholesterol   . History of kidney stones    passed spontaneously x2   . OSA on CPAP    uses cpap nightly  . Stroke ([place])   . Unspecified essential hypertension   . Weakness of both arms 07/19/2012  . Wears dentures    upper and lower partial    Family History  Problem Relation Age of Onset  . Congestive Heart Failure Father   . Heart disease Father   . Stroke Brother   . Stroke Sister   . Diabetes Mellitus II Brother   . Diabetes Mellitus II Sister   . Lung cancer Brother   . Stroke Sister   . Stroke Brother   . Colon cancer Neg Hx   . Esophageal cancer Neg Hx   . Rectal cancer Neg Hx   . Stomach cancer Neg Hx     Past Surgical History:  Procedure Laterality Date  . CARDIAC CATHETERIZATION  02/14/2007   x 2  . CARDIOVERSION N/A 04/14/2016   Procedure: CARDIOVERSION;  Surgeon: Minus Breeding, MD;  Location: [facility] ENDOSCOPY;  Service: Cardiovascular;  Laterality: N/A;  . CATARACT EXTRACTION W/PHACO Right 05/12/2015   Procedure: CATARACT EXTRACTION PHACO AND INTRAOCULAR LENS PLACEMENT RIGHT EYE CDE=7.75;  Surgeon: Tonny Branch, MD;  Location: [facility] ORS;  Service: Ophthalmology;  Laterality: Right;  . CATARACT EXTRACTION W/PHACO Left 06/12/2015   Procedure: CATARACT EXTRACTION PHACO AND INTRAOCULAR LENS PLACEMENT (IOC);  Surgeon: Tonny Branch, MD;  Location: [facility] ORS;  Service: Ophthalmology;  Laterality: Left;  CDE:  13.17  . COLONOSCOPY    . EYE SURGERY     Bilateral cataracts  . INJECTION KNEE Right 02/20/2016   Procedure: KNEE INJECTION;  Surgeon: [name], MD;  Location: [place];  Service: Orthopedics;  Laterality: Right;  . JOINT REPLACEMENT    .  KNEE ARTHROSCOPY Left   . LAPAROSCOPIC CHOLECYSTECTOMY    . MITRAL VALVE REPAIR  ~ 2003  . MULTIPLE TOOTH EXTRACTIONS    . SHOULDER ARTHROSCOPY WITH ROTATOR CUFF REPAIR AND OPEN BICEPS TENODESIS Right 01/26/2019   Procedure: right shoulder arthroscopy, rotator cuff repair and biceps tenodesis;  Surgeon: [name], MD;  Location: [place];  Service: Orthopedics;  Laterality: Right;  . SHOULDER OPEN ROTATOR CUFF REPAIR Left   . TOTAL KNEE ARTHROPLASTY  Left 02/20/2016   Procedure: LEFT TOTAL KNEE ARTHROPLASTY WITH RIGHT KNEE INJECTION;  Surgeon: [name], MD;  Location: [place];  Service: Orthopedics;  Laterality: Left;  . TOTAL KNEE ARTHROPLASTY Right 07/06/2019   Procedure: RIGHT TOTAL KNEE ARTHROPLASTY;  Surgeon: [name], MD;  Location: [place];  Service: Orthopedics;  Laterality: Right;  . UPPER GI ENDOSCOPY    . VASECTOMY     Social History   Occupational History  . Occupation:  Environmental manager    Comment: RETIRED  Tobacco Use  . Smoking status: Former Smoker    Packs/day: 3.00    Years: 35.00    Pack years: 105.00    Types: Cigarettes    Quit date: 02/16/1988    Years since quitting: 31.8  . Smokeless tobacco: Never Used  . Tobacco comment:  Year Quit: 1990   Vaping Use  . Vaping Use: Never used  Substance and Sexual Activity  . Alcohol use: No  . Drug use: Never  . Sexual activity: Not Currently

## 2019-12-02 NOTE — Progress Notes (Signed)
HPI  The patient presents for evaluation of mitral valve repair.  Echo 10/2015 demonstrated stable MV repair.  While in the hospital in 2017 for knee surgery he developed persistent atrial fib.  He had cardioversion but went back into atrial fib.  He wore a Holter monitor which demonstrated frequent 3.5 second pauses at night. His average heart rate was about 80.   He is treated for sleep apnea.  In 2019 he had some double vision and was told he could have had a "small stroke."  His INR was therapeutic. Follow up echo was normal.  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  He was also seen by neurology.    He return for follow up.  Since I last saw him he had knee surgery.  He is recovering slowly from this. The patient denies any new symptoms such as chest discomfort, neck or arm discomfort. There has been no new shortness of breath, PND or orthopnea. There have been no reported palpitations, presyncope or syncope.    Allergies  Allergen Reactions  . Flomax [Tamsulosin Hcl] Other (See Comments)    Makes him "swimmy headed"    Current Outpatient Medications  Medication Sig Dispense Refill  . amLODipine (NORVASC) 5 MG tablet TAKE 1 TABLET BY MOUTH EVERY DAY 90 tablet 0  . apixaban (ELIQUIS) 5 MG TABS tablet Take 1 tablet (5 mg total) by mouth 2 (two) times daily. 180 tablet 1  . Calcium Carb-Cholecalciferol (CALCIUM 600 + D PO) Take 1 tablet by mouth daily.    . Coenzyme Q10 300 MG CAPS Take 300 mg by mouth daily.    Marland Kitchen lisinopril (ZESTRIL) 20 MG tablet TAKE 1 TABLET BY MOUTH EVERYDAY AT BEDTIME 90 tablet 0  . metFORMIN (GLUCOPHAGE-XR) 500 MG 24 hr tablet TAKE 1 TABLET BY MOUTH EVERY DAY WITH BREAKFAST 90 tablet 0  . Multiple Vitamin (MULTIVITAMIN WITH MINERALS) TABS tablet Take 1 tablet by mouth daily.    Marland Kitchen omeprazole (PRILOSEC) 20 MG capsule TAKE 1 CAPSULE BY MOUTH EVERY DAY 90 capsule 0  . rosuvastatin (CRESTOR) 5 MG tablet Take 1  tablet (5 mg total) by mouth daily. For cholesterol (Patient taking differently: Take 5 mg by mouth every evening. For cholesterol) 90 tablet 3   No current facility-administered medications for this visit.    Past Medical History:  Diagnosis Date  . Arthritis    knees, shoulder,  hips (10/24/2017)  . Atrial fibrillation ([place])    on eliquis  . BPH (benign prostatic hyperplasia)   . Cataracts, bilateral    removed by surgery  . Coronary artery disease excluded   . Diabetes mellitus without complication ([place])    type 2  . Diverticulosis   . Endocarditis, valve unspecified, unspecified cause   . GERD (gastroesophageal reflux disease)   . H/O mitral valve repair    Postoperative ring with postoperative atrial fibrillation  . Hearing loss    both ears - does not use hearing aids  . Heart murmur    hx  . Hiatal hernia 06/13/2002  . High cholesterol   . History of kidney stones    passed spontaneously x2   . OSA on CPAP    uses cpap nightly  . Stroke ([place])   . Unspecified essential hypertension   . Weakness of both arms 07/19/2012  . Wears dentures    upper and lower partial    Past Surgical History:  Procedure Laterality Date  .  CARDIAC CATHETERIZATION  02/14/2007   x 2  . CARDIOVERSION N/A 04/14/2016   Procedure: CARDIOVERSION;  Surgeon: Minus Breeding, MD;  Location: [facility] ENDOSCOPY;  Service: Cardiovascular;  Laterality: N/A;  . CATARACT EXTRACTION W/PHACO Right 05/12/2015   Procedure: CATARACT EXTRACTION PHACO AND INTRAOCULAR LENS PLACEMENT RIGHT EYE CDE=7.75;  Surgeon: Tonny Branch, MD;  Location: [facility];  Service: Ophthalmology;  Laterality: Right;  . CATARACT EXTRACTION W/PHACO Left 06/12/2015   Procedure: CATARACT EXTRACTION PHACO AND INTRAOCULAR LENS PLACEMENT (IOC);  Surgeon: Tonny Branch, MD;  Location: [facility];  Service: Ophthalmology;  Laterality: Left;  CDE:  13.17  . COLONOSCOPY    . EYE SURGERY     Bilateral cataracts  . INJECTION KNEE Right 02/20/2016   Procedure: KNEE  INJECTION;  Surgeon: [name], MD;  Location: [place];  Service: Orthopedics;  Laterality: Right;  . JOINT REPLACEMENT    . KNEE ARTHROSCOPY Left   . LAPAROSCOPIC CHOLECYSTECTOMY    . MITRAL VALVE REPAIR  ~ 2003  . MULTIPLE TOOTH EXTRACTIONS    . SHOULDER ARTHROSCOPY WITH ROTATOR CUFF REPAIR AND OPEN BICEPS TENODESIS Right 01/26/2019   Procedure: right shoulder arthroscopy, rotator cuff repair and biceps tenodesis;  Surgeon: [name], MD;  Location: [place];  Service: Orthopedics;  Laterality: Right;  . SHOULDER OPEN ROTATOR CUFF REPAIR Left   . TOTAL KNEE ARTHROPLASTY Left 02/20/2016   Procedure: LEFT TOTAL KNEE ARTHROPLASTY WITH RIGHT KNEE INJECTION;  Surgeon: [name], MD;  Location: [place];  Service: Orthopedics;  Laterality: Left;  . TOTAL KNEE ARTHROPLASTY Right 07/06/2019   Procedure: RIGHT TOTAL KNEE ARTHROPLASTY;  Surgeon: [name], MD;  Location: [place];  Service: Orthopedics;  Laterality: Right;  . UPPER GI ENDOSCOPY    . VASECTOMY      ROS: Positive for knee pain and hip pain on the right.    .Otherwise as stated in the HPI and negative for all other systems.  PHYSICAL EXAM BP 130/70   Pulse (!) 55   Ht 5\' 11"  (1.803 m)   Wt 243 lb (110.2 kg)   BMI 33.89 kg/m   GENERAL:  Well appearing NECK:  No jugular venous distention, waveform within normal limits, carotid upstroke brisk and symmetric, no bruits, no thyromegaly LUNGS:  Clear to auscultation bilaterally CHEST:  Unremarkable HEART:  PMI not displaced or sustained,S1 and S2 within normal limits, no S3, no clicks, no rubs, no murmurs, irregular  ABD:  Flat, positive bowel sounds normal in frequency in pitch, no bruits, no rebound, no guarding, no midline pulsatile mass, no hepatomegaly, no splenomegaly EXT:  2 plus pulses throughout, no edema, no cyanosis no clubbing    EKG: ATRIAL fibrillation, rate 55, axis within normal limits, intervals within normal limits, low voltage in the limb leads, no acute ST-T  wave changes.    ASSESSMENT AND PLAN  ATRIAL FIB:    Mr. ARYAMAN HALIBURTON has a CHA2DS2 - VASc of 5.  He tolerates anticoagulation.  He has no symptoms related to this.  No change in therapy.   SLEEP APNEA:   This is followed by Dr. Claiborne Billings.  He wears his CPAP.  MITRAL VALVE REPAIR:    He had mild MR on echo in Sept 2019.  I will check an echocardiogram in September of next year to follow-up on this.   HTN:  The blood pressure is at target.  No change in therapy.   OBESITY:   We had a long discussion about  this again today.

## 2019-12-03 ENCOUNTER — Ambulatory Visit: Payer: Medicare Other | Admitting: Cardiology

## 2019-12-03 ENCOUNTER — Encounter: Payer: Self-pay | Admitting: Cardiology

## 2019-12-03 ENCOUNTER — Other Ambulatory Visit: Payer: Self-pay

## 2019-12-03 VITALS — BP 130/70 | HR 55 | Ht 71.0 in | Wt 243.0 lb

## 2019-12-03 DIAGNOSIS — Z9889 Other specified postprocedural states: Secondary | ICD-10-CM | POA: Diagnosis not present

## 2019-12-03 DIAGNOSIS — I10 Essential (primary) hypertension: Secondary | ICD-10-CM | POA: Diagnosis not present

## 2019-12-03 DIAGNOSIS — I482 Chronic atrial fibrillation, unspecified: Secondary | ICD-10-CM

## 2019-12-03 NOTE — Patient Instructions (Addendum)
Medication Instructions:  Your physician recommends that you continue on your current medications as directed. Please refer to the Current Medication list given to you today.  *If you need a refill on your cardiac medications before your next appointment, please call your pharmacy*  Testing: Your physician has requested that you have an echocardiogram in September 2022. Echocardiography is a painless test that uses sound waves to create images of your heart. It provides your doctor with information about the size and shape of your heart and how well your heart's chambers and valves are working. This procedure takes approximately one hour. There are no restrictions for this procedure.  Follow-Up: At [facility], you and your health needs are our priority.  As part of our continuing mission to provide you with exceptional heart care, we have created designated Provider Care Teams.  These Care Teams include your primary Cardiologist (physician) and Advanced Practice Providers (APPs -  Physician Assistants and Nurse Practitioners) who all work together to provide you with the care you need, when you need it.  We recommend signing up for the patient portal called "MyChart".  Sign up information is provided on this After Visit Summary.  MyChart is used to connect with patients for Virtual Visits (Telemedicine).  Patients are able to view lab/test results, encounter notes, upcoming appointments, etc.  Non-urgent messages can be sent to your provider as well.   To learn more about what you can do with MyChart, go to [email or web address].    Your next appointment:   12 month(s)  The format for your next appointment:   In Person  Provider:   Minus Breeding, MD

## 2019-12-22 ENCOUNTER — Other Ambulatory Visit: Payer: Self-pay | Admitting: Family Medicine

## 2020-01-01 ENCOUNTER — Other Ambulatory Visit: Payer: Self-pay

## 2020-01-01 ENCOUNTER — Encounter: Payer: Self-pay | Admitting: Family Medicine

## 2020-01-01 ENCOUNTER — Ambulatory Visit (INDEPENDENT_AMBULATORY_CARE_PROVIDER_SITE_OTHER): Payer: Medicare Other | Admitting: Family Medicine

## 2020-01-01 VITALS — BP 124/74 | HR 73 | Temp 97.5°F | Ht 71.0 in | Wt 241.0 lb

## 2020-01-01 DIAGNOSIS — I951 Orthostatic hypotension: Secondary | ICD-10-CM | POA: Diagnosis not present

## 2020-01-01 DIAGNOSIS — E119 Type 2 diabetes mellitus without complications: Secondary | ICD-10-CM

## 2020-01-01 DIAGNOSIS — R1319 Other dysphagia: Secondary | ICD-10-CM

## 2020-01-01 DIAGNOSIS — E785 Hyperlipidemia, unspecified: Secondary | ICD-10-CM

## 2020-01-01 DIAGNOSIS — M2550 Pain in unspecified joint: Secondary | ICD-10-CM

## 2020-01-01 DIAGNOSIS — I10 Essential (primary) hypertension: Secondary | ICD-10-CM

## 2020-01-01 LAB — CBC WITH DIFFERENTIAL/PLATELET
Basophils Absolute: 0 10*3/uL (ref 0.0–0.2)
Basos: 0 %
EOS (ABSOLUTE): 0.1 10*3/uL (ref 0.0–0.4)
Eos: 2 %
Hematocrit: 42.4 % (ref 37.5–51.0)
Hemoglobin: 14.6 g/dL (ref 13.0–17.7)
Immature Grans (Abs): 0 10*3/uL (ref 0.0–0.1)
Immature Granulocytes: 0 %
Lymphocytes Absolute: 1.8 10*3/uL (ref 0.7–3.1)
Lymphs: 24 %
MCH: 31.8 pg (ref 26.6–33.0)
MCHC: 34.4 g/dL (ref 31.5–35.7)
MCV: 92 fL (ref 79–97)
Monocytes Absolute: 0.7 10*3/uL (ref 0.1–0.9)
Monocytes: 9 %
Neutrophils Absolute: 4.9 10*3/uL (ref 1.4–7.0)
Neutrophils: 65 %
Platelets: 260 10*3/uL (ref 150–450)
RBC: 4.59 x10E6/uL (ref 4.14–5.80)
RDW: 12.9 % (ref 11.6–15.4)
WBC: 7.5 10*3/uL (ref 3.4–10.8)

## 2020-01-01 LAB — LIPID PANEL
Chol/HDL Ratio: 2.3 ratio (ref 0.0–5.0)
Cholesterol, Total: 109 mg/dL (ref 100–199)
HDL: 47 mg/dL (ref >39)
LDL Chol Calc (NIH): 45 mg/dL (ref 0–99)
Triglycerides: 89 mg/dL (ref 0–149)
VLDL Cholesterol Cal: 17 mg/dL (ref 5–40)

## 2020-01-01 LAB — CMP14+EGFR
ALT: 17 IU/L (ref 0–44)
AST: 19 IU/L (ref 0–40)
Albumin/Globulin Ratio: 1.7 (ref 1.2–2.2)
Albumin: 4.5 g/dL (ref 3.6–4.6)
Alkaline Phosphatase: 82 IU/L (ref 44–121)
BUN/Creatinine Ratio: 17 (ref 10–24)
BUN: 15 mg/dL (ref 8–27)
Bilirubin Total: 1 mg/dL (ref 0.0–1.2)
CO2: 25 mmol/L (ref 20–29)
Calcium: 9.4 mg/dL (ref 8.6–10.2)
Chloride: 103 mmol/L (ref 96–106)
Creatinine, Ser: 0.86 mg/dL (ref 0.76–1.27)
GFR calc Af Amer: 93 mL/min/{1.73_m2} (ref >59)
GFR calc non Af Amer: 81 mL/min/{1.73_m2} (ref >59)
Globulin, Total: 2.7 g/dL (ref 1.5–4.5)
Glucose: 133 mg/dL — ABNORMAL HIGH (ref 65–99)
Potassium: 4.9 mmol/L (ref 3.5–5.2)
Sodium: 142 mmol/L (ref 134–144)
Total Protein: 7.2 g/dL (ref 6.0–8.5)

## 2020-01-01 LAB — BAYER DCA HB A1C WAIVED: HB A1C (BAYER DCA - WAIVED): 6.8 % (ref <7.0)

## 2020-01-01 MED ORDER — APIXABAN 5 MG PO TABS: 5.0000 mg | ORAL_TABLET | Freq: Two times a day (BID) | ORAL | 1 refills | Status: DC

## 2020-01-01 MED ORDER — METFORMIN HCL ER 500 MG PO TB24: ORAL_TABLET | ORAL | 1 refills | Status: DC

## 2020-01-01 MED ORDER — OMEPRAZOLE 40 MG PO CPDR: DELAYED_RELEASE_CAPSULE | ORAL | 1 refills | Status: DC

## 2020-01-01 MED ORDER — AMLODIPINE BESYLATE 5 MG PO TABS: 5.0000 mg | ORAL_TABLET | Freq: Every day | ORAL | 1 refills | Status: DC

## 2020-01-01 MED ORDER — LISINOPRIL 20 MG PO TABS: ORAL_TABLET | ORAL | 1 refills | Status: DC

## 2020-01-01 NOTE — Progress Notes (Signed)
Subjective:  Patient ID: [name], male    DOB: 07/16/1935  Age: 84 y.o. MRN: [id number]  CC: Follow-up (3 month)   HPI [name] presents forFollow-up of diabetes. Patient does not  check blood sugar at home.  Patient denies symptoms such as polyuria, polydipsia, excessive hunger, nausea No significant hypoglycemic spells noted. Medications reviewed. Pt reports taking them regularly without complication/adverse reaction being reported today.  Checking feet daily.  He is having issues with food sticking at the base of his neck when he swallows.  This does not happen all the time but a few times a week.  [name] is concerned that he is having with feeling like he cannot walk straight several times a week.  It lasts just a few moments.  Generally when he first gets up.  He is having muscle and joint aches all over with neck pain at the base on the left posteriorly   History [name] has a past medical history of Arthritis, Atrial fibrillation ([place]), BPH (benign prostatic hyperplasia), Cataracts, bilateral, Coronary artery disease excluded, Diabetes mellitus without complication ([place]), Diverticulosis, Endocarditis, valve unspecified, unspecified cause, GERD (gastroesophageal reflux disease), H/O mitral valve repair, Hearing loss, Heart murmur, Hiatal hernia (06/13/2002), High cholesterol, History of kidney stones, OSA on CPAP, Stroke ([place]), Unspecified essential hypertension, Weakness of both arms (07/19/2012), and Wears dentures.   He has a past surgical history that includes Vasectomy; Mitral valve repair (~ 2003); Cataract extraction w/PHACO (Right, 05/12/2015); Cataract extraction w/PHACO (Left, 06/12/2015); Total knee arthroplasty (Left, 02/20/2016); Injection knee (Right, 02/20/2016); Cardioversion (N/A, 04/14/2016); Laparoscopic cholecystectomy; Knee arthroscopy (Left); Joint replacement; Shoulder open rotator cuff repair (Left); Colonoscopy; Upper gi endoscopy; Multiple tooth  extractions; Cardiac catheterization (02/14/2007); Shoulder arthroscopy with rotator cuff repair and open biceps tenodesis (Right, 01/26/2019); Eye surgery; and Total knee arthroplasty (Right, 07/06/2019).   His family history includes Congestive Heart Failure in his father; Diabetes Mellitus II in his brother and sister; Heart disease in his father; Lung cancer in his brother; Stroke in his brother, brother, sister, and sister.He reports that he quit smoking about 31 years ago. His smoking use included cigarettes. He has a 105.00 pack-year smoking history. He has never used smokeless tobacco. He reports that he does not drink alcohol and does not use drugs.  Current Outpatient Medications on File Prior to Visit  Medication Sig Dispense Refill  . Calcium Carb-Cholecalciferol (CALCIUM 600 + D PO) Take 1 tablet by mouth daily.    . Coenzyme Q10 300 MG CAPS Take 300 mg by mouth daily.    . Multiple Vitamin (MULTIVITAMIN WITH MINERALS) TABS tablet Take 1 tablet by mouth daily.    . rosuvastatin (CRESTOR) 5 MG tablet Take 1 tablet (5 mg total) by mouth daily. For cholesterol (Patient taking differently: Take 5 mg by mouth every evening. For cholesterol) 90 tablet 3   No current facility-administered medications on file prior to visit.    ROS Review of Systems  Constitutional: Negative for fever.  HENT: Positive for trouble swallowing (Foods seem to stick in his throat at the base of the neck).   Respiratory: Negative for shortness of breath.   Cardiovascular: Negative for chest pain.  Gastrointestinal: Positive for abdominal pain (omeprazole is not controlling his heartburn adequately.).  Musculoskeletal: Positive for arthralgias and myalgias (Patient states that he has a dull ache all over.  He has pain at the posterior neck on the left side with decreased range of motion chronically.).  Skin: Negative for rash.  Objective:  BP 124/74   Pulse 73   Temp (!) 97.5 F (36.4 C) (Temporal)   Ht  '5\' 11"'  (1.803 m)   Wt 241 lb (109.3 kg)   BMI 33.61 kg/m   BP Readings from Last 3 Encounters:  01/01/20 124/74  12/03/19 130/70  11/01/19 134/84    Wt Readings from Last 3 Encounters:  01/01/20 241 lb (109.3 kg)  12/03/19 243 lb (110.2 kg)  11/29/19 236 lb (107 kg)     Physical Exam Vitals reviewed.  Constitutional:      Appearance: He is well-developed.  HENT:     Head: Normocephalic and atraumatic.     Right Ear: Tympanic membrane and external ear normal. No decreased hearing noted.     Left Ear: Tympanic membrane and external ear normal. No decreased hearing noted.     Mouth/Throat:     Pharynx: No oropharyngeal exudate or posterior oropharyngeal erythema.  Eyes:     Pupils: Pupils are equal, round, and reactive to light.  Cardiovascular:     Rate and Rhythm: Normal rate and regular rhythm.     Heart sounds: No murmur heard.   Pulmonary:     Effort: No respiratory distress.     Breath sounds: Normal breath sounds.  Abdominal:     General: Bowel sounds are normal.     Palpations: Abdomen is soft. There is no mass.     Tenderness: There is no abdominal tenderness.  Musculoskeletal:     Cervical back: Normal range of motion and neck supple.      Results for orders placed or performed in visit on 01/01/20  Bayer DCA Hb A1c Waived  Result Value Ref Range   HB A1C (BAYER DCA - WAIVED) 6.8 <7.0 %    Assessment & Plan:   [name] was seen today for follow-up.  Diagnoses and all orders for this visit:  Type 2 diabetes mellitus without complication, without long-term current use of insulin (HCC) -     CBC with Differential/Platelet -     CMP14+EGFR -     Bayer DCA Hb A1c Waived  Essential hypertension -     CBC with Differential/Platelet -     CMP14+EGFR  Dyslipidemia -     CBC with Differential/Platelet -     CMP14+EGFR -     Lipid panel  Orthostasis  Esophageal dysphagia  Arthralgia, unspecified joint -     Arthritis Panel  Other orders -      omeprazole (PRILOSEC) 40 MG capsule; TAKE 1 CAPSULE BY MOUTH EVERY DAY -     metFORMIN (GLUCOPHAGE-XR) 500 MG 24 hr tablet; TAKE 1 TABLET BY MOUTH EVERY DAY WITH BREAKFAST -     lisinopril (ZESTRIL) 20 MG tablet; TAKE 1 TABLET BY MOUTH EVERYDAY AT BEDTIME -     apixaban (ELIQUIS) 5 MG TABS tablet; Take 1 tablet (5 mg total) by mouth 2 (two) times daily. -     amLODipine (NORVASC) 5 MG tablet; Take 1 tablet (5 mg total) by mouth daily.      I have changed [name]'s Eliquis to apixaban. I have also changed his omeprazole and amLODipine. I am also having him maintain his multivitamin with minerals, Calcium Carb-Cholecalciferol (CALCIUM 600 + D PO), Coenzyme Q10, rosuvastatin, metFORMIN, and lisinopril.  Meds ordered this encounter  Medications  . omeprazole (PRILOSEC) 40 MG capsule    Sig: TAKE 1 CAPSULE BY MOUTH EVERY DAY    Dispense:  90 capsule  Refill:  1  . metFORMIN (GLUCOPHAGE-XR) 500 MG 24 hr tablet    Sig: TAKE 1 TABLET BY MOUTH EVERY DAY WITH BREAKFAST    Dispense:  90 tablet    Refill:  1  . lisinopril (ZESTRIL) 20 MG tablet    Sig: TAKE 1 TABLET BY MOUTH EVERYDAY AT BEDTIME    Dispense:  90 tablet    Refill:  1  . apixaban (ELIQUIS) 5 MG TABS tablet    Sig: Take 1 tablet (5 mg total) by mouth 2 (two) times daily.    Dispense:  180 tablet    Refill:  1  . amLODipine (NORVASC) 5 MG tablet    Sig: Take 1 tablet (5 mg total) by mouth daily.    Dispense:  90 tablet    Refill:  1     Follow-up: Return in about 3 months (around 04/02/2020), or if symptoms worsen or fail to improve, for diabetes.  Claretta Fraise, M.D.

## 2020-01-02 NOTE — Progress Notes (Signed)
Hello [place],  Your lab result is normal and/or stable.Some minor variations that are not significant are commonly marked abnormal, but do not represent any medical problem for you.  Best regards, Joseandres Mazer, M.D.

## 2020-02-06 ENCOUNTER — Ambulatory Visit (INDEPENDENT_AMBULATORY_CARE_PROVIDER_SITE_OTHER): Payer: Medicare Other | Admitting: *Deleted

## 2020-02-06 VITALS — Ht 71.0 in | Wt 241.0 lb

## 2020-02-06 DIAGNOSIS — Z0000 Encounter for general adult medical examination without abnormal findings: Secondary | ICD-10-CM

## 2020-02-06 NOTE — Patient Instructions (Signed)
  [place] Maintenance Summary and Written Plan of Care  [name] ,  Thank you for allowing me to perform your Medicare Annual Wellness Visit and for your ongoing commitment to your health.   Health Maintenance & Immunization History Health Maintenance  Topic Date Due  . OPHTHALMOLOGY EXAM  Never done  . COVID-19 Vaccine (1) Never done  . FOOT EXAM  06/26/2020  . HEMOGLOBIN A1C  06/30/2020  . TETANUS/TDAP  01/18/2024  . INFLUENZA VACCINE  Completed  . PNA vac Low Risk Adult  Completed   Immunization History  Administered Date(s) Administered  . Fluad Quad(high Dose 65+) 11/14/2018  . Influenza Inj Mdck Quad Pf 11/14/2018  . Influenza Nasal 11/29/2016  . Influenza Split 11/15/2013, 12/03/2015  . Influenza, High Dose Seasonal PF 11/25/2014, 11/29/2016, 11/24/2017, 11/22/2019  . Influenza, Seasonal, Injecte, Preservative Fre 11/15/2012  . Influenza-Unspecified 11/15/2012, 11/29/2013, 11/14/2018  . Pneumococcal Conjugate-13 01/22/2015  . Pneumococcal Polysaccharide-23 02/15/2006  . Td 02/16/1995  . Tdap 09/21/1994, 01/17/2014    These are the patient goals that we discussed: Goals Addressed            This Visit's Progress   . Exercise 150 min/wk Moderate Activity       02/06/2020 AWV Goal: Exercise for General Health   Patient will verbalize understanding of the benefits of increased physical activity:  Exercising regularly is important. It will improve your overall fitness, flexibility, and endurance.  Regular exercise also will improve your overall health. It can help you control your weight, reduce stress, and improve your bone density.  Over the next year, patient will increase physical activity as tolerated with a goal of at least 150 minutes of moderate physical activity per week.   You can tell that you are exercising at a moderate intensity if your heart starts beating faster and you start breathing faster but can still hold a  conversation.  Moderate-intensity exercise ideas include:  Walking 1 mile (1.6 km) in about 15 minutes  Biking  Hiking  Golfing  Dancing  Water aerobics  Patient will verbalize understanding of everyday activities that increase physical activity by providing examples like the following: ? Yard work, such as: ? Pushing a Conservation officer, nature ? Raking and bagging leaves ? Washing your car ? Pushing a stroller ? Shoveling snow ? Gardening ? Washing windows or floors  Patient will be able to explain general safety guidelines for exercising:   Before you start a new exercise program, talk with your health care provider.  Do not exercise so much that you hurt yourself, feel dizzy, or get very short of breath.  Wear comfortable clothes and wear shoes with good support.  Drink plenty of water while you exercise to prevent dehydration or heat stroke.  Work out until your breathing and your heartbeat get faster.         This is a list of Health Maintenance Items that are overdue or due now: Health Maintenance Due  Topic Date Due  . OPHTHALMOLOGY EXAM  Never done  . COVID-19 Vaccine (1) Never done     Orders/Referrals Placed Today: No orders of the defined types were placed in this encounter.  (Contact our referral department at [phone] if you have not spoken with someone about your referral appointment within the next 5 days)    Follow-up Plan Follow up with Dr. Livia Snellen as scheduled

## 2020-02-06 NOTE — Progress Notes (Signed)
MEDICARE ANNUAL WELLNESS VISIT  02/06/2020  Telephone Visit Disclaimer This Medicare AWV was conducted by telephone due to national recommendations for restrictions regarding the COVID-19 Pandemic (e.g. social distancing).  I verified, using two identifiers, that I am speaking with [name] or their authorized healthcare agent. I discussed the limitations, risks, security, and privacy concerns of performing an evaluation and management service by telephone and the potential availability of an in-person appointment in the future. The patient expressed understanding and agreed to proceed.  Location of Patient: Home Location of Provider (nurse):  Office  Subjective:    [name] is a 84 y.o. male patient of Stacks, [name] Gash, MD who had a Medicare Annual Wellness Visit today via telephone. [name] is Retired and lives with their spouse. he has 2 children. he reports that he is socially active and does interact with friends/family regularly. he is minimally physically active and enjoys golfing.  Patient Care Team: Claretta Fraise, MD as PCP - General (Family Medicine) Minus Breeding, MD as PCP - Cardiology (Cardiology) Steffanie Rainwater, DPM as Consulting Physician (Podiatry) de Stanford Scotland, MD (Inactive) as Attending Physician (Cardiology)  Advanced Directives 07/02/2019 02/05/2019 01/26/2019 10/01/2018 10/24/2017 07/21/2016 04/14/2016  Does Patient Have a Medical Advance Directive? Yes Yes Yes No Yes Yes Yes  Type of Paramedic of [place];Living will [place];Living will Living will - Living will [place];Living will Living will;Healthcare Power of Attorney  Does patient want to make changes to medical advance directive? No - Patient declined No - Patient declined - - No - Patient declined No - Patient declined -  Copy of [place] in Chart? No - copy requested No - copy requested - - - No - copy  requested No - copy requested  Would patient like information on creating a medical advance directive? - - - No - Patient declined - - [facility] Utilization Over the Past 12 Months: # of hospitalizations or ER visits: 0 # of surgeries: 1  Review of Systems    Patient reports that his overall health is unchanged compared to last year.  History obtained from chart review and the patient General ROS: negative  Patient Reported Readings (BP, Pulse, CBG, Weight, etc) none  Pain Assessment Pain : No/denies pain     Current Medications & Allergies (verified) Allergies as of 02/06/2020      Reactions   Flomax [tamsulosin Hcl] Other (See Comments)   Makes him "swimmy headed"      Medication List       Accurate as of February 06, 2020  8:59 AM. If you have any questions, ask your nurse or doctor.        amLODipine 5 MG tablet Commonly known as: NORVASC Take 1 tablet (5 mg total) by mouth daily.   apixaban 5 MG Tabs tablet Commonly known as: Eliquis Take 1 tablet (5 mg total) by mouth 2 (two) times daily.   CALCIUM 600 + D PO Take 1 tablet by mouth daily.   Coenzyme Q10 300 MG Caps Take 300 mg by mouth daily.   lisinopril 20 MG tablet Commonly known as: ZESTRIL TAKE 1 TABLET BY MOUTH EVERYDAY AT BEDTIME   metFORMIN 500 MG 24 hr tablet Commonly known as: GLUCOPHAGE-XR TAKE 1 TABLET BY MOUTH EVERY DAY WITH BREAKFAST   multivitamin with minerals Tabs tablet Take 1 tablet by mouth daily.   omeprazole 40 MG capsule Commonly  known as: PRILOSEC TAKE 1 CAPSULE BY MOUTH EVERY DAY   rosuvastatin 5 MG tablet Commonly known as: Crestor Take 1 tablet (5 mg total) by mouth daily. For cholesterol What changed: when to take this       History (reviewed): Past Medical History:  Diagnosis Date   Arthritis    knees, shoulder,  hips (10/24/2017)   Atrial fibrillation (HCC)    on eliquis   BPH (benign prostatic hyperplasia)    Cataracts, bilateral    removed by  surgery   Coronary artery disease excluded    Diabetes mellitus without complication ([place])    type 2   Diverticulosis    Endocarditis, valve unspecified, unspecified cause    GERD (gastroesophageal reflux disease)    H/O mitral valve repair    Postoperative ring with postoperative atrial fibrillation   Hearing loss    both ears - does not use hearing aids   Heart murmur    hx   Hiatal hernia 06/13/2002   High cholesterol    History of kidney stones    passed spontaneously x2    OSA on CPAP    uses cpap nightly   Stroke ([place])    Unspecified essential hypertension    Weakness of both arms 07/19/2012   Wears dentures    upper and lower partial   Past Surgical History:  Procedure Laterality Date   CARDIAC CATHETERIZATION  02/14/2007   x 2   CARDIOVERSION N/A 04/14/2016   Procedure: CARDIOVERSION;  Surgeon: Minus Breeding, MD;  Location: [facility];  Service: Cardiovascular;  Laterality: N/A;   CATARACT EXTRACTION W/PHACO Right 05/12/2015   Procedure: CATARACT EXTRACTION PHACO AND INTRAOCULAR LENS PLACEMENT RIGHT EYE CDE=7.75;  Surgeon: Tonny Branch, MD;  Location: [facility];  Service: Ophthalmology;  Laterality: Right;   CATARACT EXTRACTION W/PHACO Left 06/12/2015   Procedure: CATARACT EXTRACTION PHACO AND INTRAOCULAR LENS PLACEMENT (IOC);  Surgeon: Tonny Branch, MD;  Location: [facility];  Service: Ophthalmology;  Laterality: Left;  CDE:  13.17   COLONOSCOPY     EYE SURGERY     Bilateral cataracts   INJECTION KNEE Right 02/20/2016   Procedure: KNEE INJECTION;  Surgeon: [name], MD;  Location: [facility];  Service: Orthopedics;  Laterality: Right;   JOINT REPLACEMENT     KNEE ARTHROSCOPY Left    LAPAROSCOPIC CHOLECYSTECTOMY     MITRAL VALVE REPAIR  ~ 2003   MULTIPLE TOOTH EXTRACTIONS     SHOULDER ARTHROSCOPY WITH ROTATOR CUFF REPAIR AND OPEN BICEPS TENODESIS Right 01/26/2019   Procedure: right shoulder arthroscopy, rotator cuff repair and biceps tenodesis;   Surgeon: [name], MD;  Location: [facility];  Service: Orthopedics;  Laterality: Right;   SHOULDER OPEN ROTATOR CUFF REPAIR Left    TOTAL KNEE ARTHROPLASTY Left 02/20/2016   Procedure: LEFT TOTAL KNEE ARTHROPLASTY WITH RIGHT KNEE INJECTION;  Surgeon: [name], MD;  Location: [facility];  Service: Orthopedics;  Laterality: Left;   TOTAL KNEE ARTHROPLASTY Right 07/06/2019   Procedure: RIGHT TOTAL KNEE ARTHROPLASTY;  Surgeon: [name], MD;  Location: [facility];  Service: Orthopedics;  Laterality: Right;   UPPER GI ENDOSCOPY     VASECTOMY     Family History  Problem Relation Age of Onset   Congestive Heart Failure Father    Heart disease Father    Stroke Brother    Stroke Sister    Diabetes Mellitus II Brother    Diabetes Mellitus II Sister    Lung cancer Brother  Stroke Sister    Stroke Brother    Colon cancer Neg Hx    Esophageal cancer Neg Hx    Rectal cancer Neg Hx    Stomach cancer Neg Hx    Social History   Socioeconomic History   Marital status: Married    Spouse name: GLORIA   Number of children: 2   Years of education: 12   Highest education level: High school graduate  Occupational History   Occupation:  Environmental manager    Comment: RETIRED  Tobacco Use   Smoking status: Former Smoker    Packs/day: 3.00    Years: 35.00    Pack years: 105.00    Types: Cigarettes    Quit date: 02/16/1988    Years since quitting: 31.9   Smokeless tobacco: Never Used   Tobacco comment:  Year Quit: 1990   Vaping Use   Vaping Use: Never used  Substance and Sexual Activity   Alcohol use: No   Drug use: Never   Sexual activity: Not Currently  Other Topics Concern   Not on file  Social History Narrative   Not on file   Social Determinants of Health   Financial Resource Strain: Low Risk    Difficulty of Paying Living Expenses: Not hard at all  Food Insecurity: No Food Insecurity   Worried About Charity fundraiser in the Last Year: Never  true   [place] in the Last Year: Never true  Transportation Needs: No Transportation Needs   Lack of Transportation (Medical): No   Lack of Transportation (Non-Medical): No  Physical Activity: Insufficiently Active   Days of Exercise per Week: 2 days   Minutes of Exercise per Session: 60 min  Stress: No Stress Concern Present   Feeling of Stress : Not at all  Social Connections: Socially Integrated   Frequency of Communication with Friends and Family: More than three times a week   Frequency of Social Gatherings with Friends and Family: Once a week   Attends Religious Services: More than 4 times per year   Active Member of [employer] or Organizations: Yes   Attends Archivist Meetings: More than 4 times per year   Marital Status: Married    Activities of Daily Living In your present state of health, do you have any difficulty performing the following activities: 02/06/2020 07/02/2019  Hearing? Y N  Vision? N N  Difficulty concentrating or making decisions? N N  Walking or climbing stairs? N N  Dressing or bathing? N N  Doing errands, shopping? N N  Preparing Food and eating ? N -  Using the Toilet? N -  In the past six months, have you accidently leaked urine? N -  Do you have problems with loss of bowel control? N -  Managing your Medications? N -  Managing your Finances? N -  Housekeeping or managing your Housekeeping? N -  Some recent data might be hidden    Patient Education/ Literacy How often do you need to have someone help you when you read instructions, pamphlets, or other written materials from your doctor or pharmacy?: 1 - Never What is the last grade level you completed in school?: 12th Grade  Exercise Current Exercise Habits: Home exercise routine, Type of exercise: strength training/weights;treadmill, Time (Minutes): 60, Frequency (Times/Week): 2, Weekly Exercise (Minutes/Week): 120, Intensity: Mild, Exercise limited by: None  identified  Diet Patient reports consuming 3 meals a day and 1 snack(s) a day Patient reports  that his primary diet is: Regular Patient reports that she does have regular access to food.   Depression Screen PHQ 2/9 Scores 02/06/2020 02/06/2020 01/01/2020 10/01/2019 06/27/2019 03/21/2019 02/05/2019  PHQ - 2 Score 0 0 0 0 0 0 0  PHQ- 9 Score - - - - - - -     Fall Risk Fall Risk  02/06/2020 01/01/2020 10/01/2019 06/27/2019 03/21/2019  Falls in the past year? 0 0 0 1 1  Number falls in past yr: 0 0 - 0 1  Injury with Fall? 0 0 - 1 1  Comment - - - shoulder injury -  Risk for fall due to : No Fall Risks No Fall Risks - Impaired balance/gait;History of fall(s) History of fall(s);Impaired balance/gait  Follow up Falls evaluation completed Falls evaluation completed Falls evaluation completed Falls evaluation completed Falls evaluation completed     Objective:  [name] seemed alert and oriented and he participated appropriately during our telephone visit.  Blood Pressure Weight BMI  BP Readings from Last 3 Encounters:  01/01/20 124/74  12/03/19 130/70  11/01/19 134/84   Wt Readings from Last 3 Encounters:  02/06/20 241 lb (109.3 kg)  01/01/20 241 lb (109.3 kg)  12/03/19 243 lb (110.2 kg)   BMI Readings from Last 1 Encounters:  02/06/20 33.61 kg/m    *Unable to obtain current vital signs, weight, and BMI due to telephone visit type  Hearing/Vision   Andra did  seem to have difficulty with hearing/understanding during the telephone conversation  Reports that he has not had a formal eye exam by an eye care professional within the past year  Reports that he has had a formal hearing evaluation within the past year *Unable to fully assess hearing and vision during telephone visit type  Cognitive Function: 6CIT Screen 02/06/2020 02/05/2019  What Year? 0 points 0 points  What month? 0 points 0 points  What time? 0 points 0 points  Count back from 20 0 points 0 points   Months in reverse 0 points 0 points  Repeat phrase 0 points 4 points  Total Score 0 4   (Normal:0-7, Significant for Dysfunction: >8)  Normal Cognitive Function Screening: Yes   Immunization & Health Maintenance Record Immunization History  Administered Date(s) Administered   Fluad Quad(high Dose 65+) 11/14/2018   Influenza Inj Mdck Quad Pf 11/14/2018   Influenza Nasal 11/29/2016   Influenza Split 11/15/2013, 12/03/2015   Influenza, High Dose Seasonal PF 11/25/2014, 11/29/2016, 11/24/2017, 11/22/2019   Influenza, Seasonal, Injecte, Preservative Fre 11/15/2012   Influenza-Unspecified 11/15/2012, 11/29/2013, 11/14/2018   Pneumococcal Conjugate-13 01/22/2015   Pneumococcal Polysaccharide-23 02/15/2006   Td 02/16/1995   Tdap 09/21/1994, 01/17/2014    Health Maintenance  Topic Date Due   OPHTHALMOLOGY EXAM  Never done   COVID-19 Vaccine (1) Never done   FOOT EXAM  06/26/2020   HEMOGLOBIN A1C  06/30/2020   TETANUS/TDAP  01/18/2024   INFLUENZA VACCINE  Completed   PNA vac Low Risk Adult  Completed       Assessment  This is a routine wellness examination for [name].  Health Maintenance: Due or Overdue Health Maintenance Due  Topic Date Due   OPHTHALMOLOGY EXAM  Never done   COVID-19 Vaccine (1) Never done    [name] does not need a referral for Community Assistance: Care Management:   no Social Work:    no Prescription Assistance:  no Nutrition/Diabetes Education:  no   Plan:  Personalized Goals Goals Addressed  This Visit's Progress    Exercise 150 min/wk Moderate Activity       02/06/2020 AWV Goal: Exercise for General Health   Patient will verbalize understanding of the benefits of increased physical activity:  Exercising regularly is important. It will improve your overall fitness, flexibility, and endurance.  Regular exercise also will improve your overall health. It can help you control your  weight, reduce stress, and improve your bone density.  Over the next year, patient will increase physical activity as tolerated with a goal of at least 150 minutes of moderate physical activity per week.   You can tell that you are exercising at a moderate intensity if your heart starts beating faster and you start breathing faster but can still hold a conversation.  Moderate-intensity exercise ideas include:  Walking 1 mile (1.6 km) in about 15 minutes  Biking  Hiking  Golfing  Dancing  Water aerobics  Patient will verbalize understanding of everyday activities that increase physical activity by providing examples like the following: ? Yard work, such as: ? Pushing a Conservation officer, nature ? Raking and bagging leaves ? Washing your car ? Pushing a stroller ? Shoveling snow ? Gardening ? Washing windows or floors  Patient will be able to explain general safety guidelines for exercising:   Before you start a new exercise program, talk with your health care provider.  Do not exercise so much that you hurt yourself, feel dizzy, or get very short of breath.  Wear comfortable clothes and wear shoes with good support.  Drink plenty of water while you exercise to prevent dehydration or heat stroke.  Work out until your breathing and your heartbeat get faster.       Personalized Health Maintenance & Screening Recommendations  Glaucoma screening Advanced directives: has an advanced directive - a copy HAS NOT been provided.  Lung Cancer Screening Recommended: no (Low Dose CT Chest recommended if Age 55-80 years, 30 pack-year currently smoking OR have quit w/in past 15 years) Hepatitis C Screening recommended: no HIV Screening recommended: no  Advanced Directives: Written information was not prepared per patient's request.  Referrals & Orders No orders of the defined types were placed in this encounter.   Follow-up Plan  Follow-up with Claretta Fraise, MD as planned    I have  personally reviewed and noted the following in the patients chart:    Medical and social history  Use of alcohol, tobacco or illicit drugs   Current medications and supplements  Functional ability and status  Nutritional status  Physical activity  Advanced directives  List of other physicians  Hospitalizations, surgeries, and ER visits in previous 12 months  Vitals  Screenings to include cognitive, depression, and falls  Referrals and appointments  In addition, I have reviewed and discussed with [name] certain preventive protocols, quality metrics, and best practice recommendations. A written personalized care plan for preventive services as well as general preventive health recommendations is available and can be mailed to the patient at his request.      Wardell Heath, LPN  [phone]     AVS printed and mailed to patient

## 2020-02-18 ENCOUNTER — Telehealth: Payer: Self-pay | Admitting: *Deleted

## 2020-02-18 DIAGNOSIS — I482 Chronic atrial fibrillation, unspecified: Secondary | ICD-10-CM

## 2020-02-18 DIAGNOSIS — Z5181 Encounter for therapeutic drug level monitoring: Secondary | ICD-10-CM

## 2020-02-18 DIAGNOSIS — Z9889 Other specified postprocedural states: Secondary | ICD-10-CM

## 2020-02-18 NOTE — Telephone Encounter (Signed)
Lab orders for CBC and BMP printed and mailed to patient.

## 2020-02-25 ENCOUNTER — Other Ambulatory Visit: Payer: Self-pay

## 2020-02-25 ENCOUNTER — Other Ambulatory Visit (HOSPITAL_COMMUNITY)
Admission: RE | Admit: 2020-02-25 | Discharge: 2020-02-25 | Disposition: A | Discharge status: Home / Self Care | Payer: Medicare Other | Source: Ambulatory Visit | Attending: Cardiology | Admitting: Cardiology

## 2020-02-25 DIAGNOSIS — Z5181 Encounter for therapeutic drug level monitoring: Secondary | ICD-10-CM | POA: Diagnosis not present

## 2020-02-25 DIAGNOSIS — I482 Chronic atrial fibrillation, unspecified: Secondary | ICD-10-CM | POA: Insufficient documentation

## 2020-02-25 LAB — CBC
HCT: 42.9 % (ref 39.0–52.0)
Hemoglobin: 14 g/dL (ref 13.0–17.0)
MCH: 31.7 pg (ref 26.0–34.0)
MCHC: 32.6 g/dL (ref 30.0–36.0)
MCV: 97.3 fL (ref 80.0–100.0)
Platelets: 228 10*3/uL (ref 150–400)
RBC: 4.41 MIL/uL (ref 4.22–5.81)
RDW: 13.2 % (ref 11.5–15.5)
WBC: 7.5 10*3/uL (ref 4.0–10.5)
nRBC: 0 % (ref 0.0–0.2)

## 2020-02-25 LAB — BASIC METABOLIC PANEL
Anion gap: 7 (ref 5–15)
BUN: 16 mg/dL (ref 8–23)
CO2: 28 mmol/L (ref 22–32)
Calcium: 8.9 mg/dL (ref 8.9–10.3)
Chloride: 103 mmol/L (ref 98–111)
Creatinine, Ser: 0.81 mg/dL (ref 0.61–1.24)
GFR, Estimated: >60 mL/min (ref >60)
Glucose, Bld: 142 mg/dL — ABNORMAL HIGH (ref 70–99)
Potassium: 3.7 mmol/L (ref 3.5–5.1)
Sodium: 138 mmol/L (ref 135–145)

## 2020-02-28 ENCOUNTER — Other Ambulatory Visit: Payer: Self-pay

## 2020-02-28 ENCOUNTER — Ambulatory Visit (INDEPENDENT_AMBULATORY_CARE_PROVIDER_SITE_OTHER): Payer: Medicare Other | Admitting: Orthopaedic Surgery

## 2020-02-28 ENCOUNTER — Encounter: Payer: Self-pay | Admitting: Orthopaedic Surgery

## 2020-02-28 ENCOUNTER — Ambulatory Visit: Payer: Self-pay

## 2020-02-28 VITALS — Ht 71.0 in | Wt 241.0 lb

## 2020-02-28 DIAGNOSIS — M542 Cervicalgia: Secondary | ICD-10-CM | POA: Diagnosis not present

## 2020-02-28 DIAGNOSIS — M47812 Spondylosis without myelopathy or radiculopathy, cervical region: Secondary | ICD-10-CM | POA: Insufficient documentation
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
## 2020-03-09 ENCOUNTER — Other Ambulatory Visit: Payer: Self-pay | Admitting: Family Medicine

## 2020-03-09 DIAGNOSIS — E119 Type 2 diabetes mellitus without complications: Secondary | ICD-10-CM

## 2020-03-11 ENCOUNTER — Other Ambulatory Visit: Payer: Self-pay

## 2020-03-11 ENCOUNTER — Ambulatory Visit (INDEPENDENT_AMBULATORY_CARE_PROVIDER_SITE_OTHER): Payer: Medicare Other | Admitting: *Deleted

## 2020-03-11 DIAGNOSIS — G459 Transient cerebral ischemic attack, unspecified: Secondary | ICD-10-CM

## 2020-03-11 DIAGNOSIS — Z5181 Encounter for therapeutic drug level monitoring: Secondary | ICD-10-CM | POA: Diagnosis not present

## 2020-03-11 DIAGNOSIS — I4891 Unspecified atrial fibrillation: Secondary | ICD-10-CM

## 2020-03-11 NOTE — Progress Notes (Signed)
Pt was started on Eliquis5mg  twice dailyforatrial fibrillationon 12/15/17.  He denies any problems since starting Eliquis. He denies any bleeding, increased bruising or GI upset.Had Rt TKR by Dr Lorin Mercy 06/2019. No problems.  Reviewed patients medication list. Pt is notcurrently on any combined P-gp and strong CYP3A4 inhibitors/inducers (ketoconazole, traconazole, ritonavir, carbamazepine, phenytoin, rifampin, St. John's wort). Reviewed labs from1/10/22. SCr 0.81,Weight 104.5kg, Dose isappropriate based on age, weight, and SCr. Hgb and HCT:14.0/42.9Plts 228kLabs are stable.  A full discussion of the nature of anticoagulants has been carried out. A benefit/risk analysis has been presented to the patient, so that they understand the justification for choosing anticoagulation with Eliquis at this time. The need for compliance is stressed. Pt is aware to take the medication twice daily. Side effects of potential bleeding are discussed, including unusual colored urine or stools, coughing up blood or coffee ground emesis, nose bleeds or serious fall or head trauma. Discussed signs and symptoms of stroke. The patient should avoid any OTC items containing aspirin or ibuprofen. Avoid alcohol consumption. Call if any signs of abnormal bleeding. Discussed financial obligations and resolved any difficulty in obtaining medication.   Results of lab work discussed with patient.Pt does not have any questions.

## 2020-03-16 ENCOUNTER — Other Ambulatory Visit: Payer: Self-pay | Admitting: Surgery

## 2020-03-17 NOTE — Telephone Encounter (Signed)
OK to refill once thanks

## 2020-03-17 NOTE — Telephone Encounter (Signed)
Please advise 

## 2020-04-02 ENCOUNTER — Ambulatory Visit (INDEPENDENT_AMBULATORY_CARE_PROVIDER_SITE_OTHER): Payer: Medicare Other | Admitting: Family Medicine

## 2020-04-02 ENCOUNTER — Other Ambulatory Visit: Payer: Self-pay

## 2020-04-02 ENCOUNTER — Encounter: Payer: Self-pay | Admitting: Family Medicine

## 2020-04-02 VITALS — BP 133/80 | HR 62 | Temp 97.2°F | Resp 20 | Ht 71.0 in | Wt 233.5 lb

## 2020-04-02 DIAGNOSIS — R1319 Other dysphagia: Secondary | ICD-10-CM | POA: Diagnosis not present

## 2020-04-02 DIAGNOSIS — E785 Hyperlipidemia, unspecified: Secondary | ICD-10-CM | POA: Diagnosis not present

## 2020-04-02 DIAGNOSIS — I10 Essential (primary) hypertension: Secondary | ICD-10-CM

## 2020-04-02 DIAGNOSIS — E119 Type 2 diabetes mellitus without complications: Secondary | ICD-10-CM

## 2020-04-02 DIAGNOSIS — H6122 Impacted cerumen, left ear: Secondary | ICD-10-CM | POA: Diagnosis not present

## 2020-04-02 LAB — CBC WITH DIFFERENTIAL/PLATELET
Basophils Absolute: 0 10*3/uL (ref 0.0–0.2)
Basos: 1 %
EOS (ABSOLUTE): 0.1 10*3/uL (ref 0.0–0.4)
Eos: 2 %
Hematocrit: 43.9 % (ref 37.5–51.0)
Hemoglobin: 14.7 g/dL (ref 13.0–17.7)
Immature Grans (Abs): 0 10*3/uL (ref 0.0–0.1)
Immature Granulocytes: 0 %
Lymphocytes Absolute: 2 10*3/uL (ref 0.7–3.1)
Lymphs: 30 %
MCH: 30.9 pg (ref 26.6–33.0)
MCHC: 33.5 g/dL (ref 31.5–35.7)
MCV: 92 fL (ref 79–97)
Monocytes Absolute: 0.7 10*3/uL (ref 0.1–0.9)
Monocytes: 10 %
Neutrophils Absolute: 3.7 10*3/uL (ref 1.4–7.0)
Neutrophils: 57 %
Platelets: 224 10*3/uL (ref 150–450)
RBC: 4.76 x10E6/uL (ref 4.14–5.80)
RDW: 12.6 % (ref 11.6–15.4)
WBC: 6.5 10*3/uL (ref 3.4–10.8)

## 2020-04-02 LAB — LIPID PANEL
Chol/HDL Ratio: 2.2 ratio (ref 0.0–5.0)
Cholesterol, Total: 107 mg/dL (ref 100–199)
HDL: 48 mg/dL (ref >39)
LDL Chol Calc (NIH): 44 mg/dL (ref 0–99)
Triglycerides: 68 mg/dL (ref 0–149)
VLDL Cholesterol Cal: 15 mg/dL (ref 5–40)

## 2020-04-02 LAB — CMP14+EGFR
ALT: 13 IU/L (ref 0–44)
AST: 15 IU/L (ref 0–40)
Albumin/Globulin Ratio: 2 (ref 1.2–2.2)
Albumin: 4.3 g/dL (ref 3.6–4.6)
Alkaline Phosphatase: 82 IU/L (ref 44–121)
BUN/Creatinine Ratio: 19 (ref 10–24)
BUN: 15 mg/dL (ref 8–27)
Bilirubin Total: 0.6 mg/dL (ref 0.0–1.2)
CO2: 26 mmol/L (ref 20–29)
Calcium: 8.9 mg/dL (ref 8.6–10.2)
Chloride: 103 mmol/L (ref 96–106)
Creatinine, Ser: 0.81 mg/dL (ref 0.76–1.27)
GFR calc Af Amer: 96 mL/min/{1.73_m2} (ref >59)
GFR calc non Af Amer: 83 mL/min/{1.73_m2} (ref >59)
Globulin, Total: 2.1 g/dL (ref 1.5–4.5)
Glucose: 118 mg/dL — ABNORMAL HIGH (ref 65–99)
Potassium: 4.2 mmol/L (ref 3.5–5.2)
Sodium: 142 mmol/L (ref 134–144)
Total Protein: 6.4 g/dL (ref 6.0–8.5)

## 2020-04-02 LAB — BAYER DCA HB A1C WAIVED: HB A1C (BAYER DCA - WAIVED): 6.4 % (ref <7.0)

## 2020-04-02 MED ORDER — ROSUVASTATIN CALCIUM 5 MG PO TABS: 5.0000 mg | ORAL_TABLET | Freq: Every day | ORAL | 0 refills | Status: DC

## 2020-04-02 NOTE — Progress Notes (Signed)
Subjective:  Patient ID: [name], male    DOB: Jul 12, 1935  Age: 85 y.o. MRN: [id number]  CC: Medical Management of Chronic Issues   HPI [name] presents for presents forFollow-up of diabetes. Patient not checking blood sugar at home.  Cutting back on carbs - bread Patient denies symptoms such as polyuria, polydipsia, excessive hunger, nausea No significant hypoglycemic spells noted. Medications reviewed. Pt reports taking them regularly without complication/adverse reaction being reported today.  Checking feet daily.  Patient in for follow-up of elevated cholesterol. Doing well without complaints on current medication. Denies side effects of statin including myalgia and arthralgia and nausea. Also in today for liver function testing. Currently no chest pain, shortness of breath or other cardiovascular related symptoms noted.   Follow-up of hypertension. Patient has no history of headache chest pain or shortness of breath or recent cough. Patient also denies symptoms of TIA such as numbness weakness lateralizing. Patient checks  blood pressure at home and has not had any elevated readings recently. Patient denies side effects from his medication. States taking it regularly.    Depression screen [name] 2/9 04/02/2020 02/06/2020 02/06/2020  Decreased Interest 0 0 0  Down, Depressed, Hopeless 0 0 0  PHQ - 2 Score 0 0 0  Altered sleeping - - -  Tired, decreased energy - - -  Change in appetite - - -  Feeling bad or failure about yourself  - - -  Trouble concentrating - - -  Moving slowly or fidgety/restless - - -  PHQ-9 Score - - -  Some recent data might be hidden    History [name] has a past medical history of Arthritis, Atrial fibrillation ([place]), BPH (benign prostatic hyperplasia), Cataracts, bilateral, Coronary artery disease excluded, Diabetes mellitus without complication ([place]), Diverticulosis, Endocarditis, valve unspecified, unspecified cause, GERD  (gastroesophageal reflux disease), H/O mitral valve repair, Hearing loss, Heart murmur, Hiatal hernia (06/13/2002), High cholesterol, History of kidney stones, OSA on CPAP, Stroke ([place]), Unspecified essential hypertension, Weakness of both arms (07/19/2012), and Wears dentures.   He has a past surgical history that includes Vasectomy; Mitral valve repair (~ 2003); Cataract extraction w/PHACO (Right, 05/12/2015); Cataract extraction w/PHACO (Left, 06/12/2015); Total knee arthroplasty (Left, 02/20/2016); Injection knee (Right, 02/20/2016); Cardioversion (N/A, 04/14/2016); Laparoscopic cholecystectomy; Knee arthroscopy (Left); Joint replacement; Shoulder open rotator cuff repair (Left); Colonoscopy; Upper gi endoscopy; Multiple tooth extractions; Cardiac catheterization (02/14/2007); Shoulder arthroscopy with rotator cuff repair and open biceps tenodesis (Right, 01/26/2019); Eye surgery; and Total knee arthroplasty (Right, 07/06/2019).   His family history includes Congestive Heart Failure in his father; Diabetes Mellitus II in his brother and sister; Heart disease in his father; Lung cancer in his brother; Stroke in his brother, brother, sister, and sister.He reports that he quit smoking about 32 years ago. His smoking use included cigarettes. He has a 105.00 pack-year smoking history. He has never used smokeless tobacco. He reports that he does not drink alcohol and does not use drugs.    ROS Review of Systems  Constitutional: Negative for fever.  HENT: Positive for ear pain (left ear has pressure and a "Racket for several day.") and hearing loss.   Respiratory: Negative for shortness of breath.   Cardiovascular: Negative for chest pain.  Musculoskeletal: Positive for neck pain (posterior. Stiff for three days a month ago. Used home traction device). Negative for arthralgias.  Skin: Negative for rash.    Objective:  BP 133/80   Pulse 62   Temp (!) 97.2 F (36.2  C) (Temporal)   Resp 20   Ht _0  (1.803  m)   Wt 233 lb 8 oz (105.9 kg)   SpO2 97%   BMI 32.57 kg/m   BP Readings from Last 3 Encounters:  04/02/20 133/80  01/01/20 124/74  12/03/19 130/70    Wt Readings from Last 3 Encounters:  04/02/20 233 lb 8 oz (105.9 kg)  02/28/20 241 lb (109.3 kg)  02/06/20 241 lb (109.3 kg)     Physical Exam Vitals reviewed.  Constitutional:      Appearance: He is well-developed and well-nourished.  HENT:     Head: Normocephalic and atraumatic.     Right Ear: Tympanic membrane and external ear normal. No decreased hearing noted.     Left Ear: Tympanic membrane and external ear normal. No decreased hearing noted. There is impacted cerumen.     Mouth/Throat:     Pharynx: No oropharyngeal exudate or posterior oropharyngeal erythema.  Eyes:     Pupils: Pupils are equal, round, and reactive to light.  Cardiovascular:     Rate and Rhythm: Normal rate and regular rhythm.     Heart sounds: No murmur heard.   Pulmonary:     Effort: No respiratory distress.     Breath sounds: Normal breath sounds.  Abdominal:     General: Bowel sounds are normal.     Palpations: Abdomen is soft. There is no mass.     Tenderness: There is no abdominal tenderness.  Musculoskeletal:     Cervical back: Normal range of motion and neck supple.       Assessment & Plan:   [name] was seen today for medical management of chronic issues.  Diagnoses and all orders for this visit:  Type 2 diabetes mellitus without complication, without long-term current use of insulin (HCC) -     Bayer DCA Hb A1c Waived -     CBC with Differential/Platelet -     CMP14+EGFR -     Lipid panel -     rosuvastatin (CRESTOR) 5 MG tablet; Take 1 tablet (5 mg total) by mouth daily. For cholesterol  Essential hypertension -     CBC with Differential/Platelet -     CMP14+EGFR -     Lipid panel  Dyslipidemia -     CBC with Differential/Platelet -     CMP14+EGFR -     Lipid panel  Esophageal dysphagia -     Ambulatory  referral to Gastroenterology  Impacted cerumen of left ear  Lavage performed with resolution     I have discontinued [name]'s methocarbamol. I am also having him maintain his multivitamin with minerals, Calcium Carb-Cholecalciferol (CALCIUM 600 + D PO), Coenzyme Q10, omeprazole, metFORMIN, lisinopril, apixaban, amLODipine, and rosuvastatin.  Allergies as of 04/02/2020      Reactions   Flomax [tamsulosin Hcl] Other (See Comments)   Makes him "swimmy headed"      Medication List       Accurate as of April 02, 2020  9:14 PM. If you have any questions, ask your nurse or doctor.        STOP taking these medications   methocarbamol 500 MG tablet Commonly known as: ROBAXIN Stopped by: Claretta Fraise, MD     TAKE these medications   amLODipine 5 MG tablet Commonly known as: NORVASC Take 1 tablet (5 mg total) by mouth daily.   apixaban 5 MG Tabs tablet Commonly known as: Eliquis Take 1 tablet (5 mg total) by mouth 2 (  two) times daily.   CALCIUM 600 + D PO Take 1 tablet by mouth daily.   Coenzyme Q10 300 MG Caps Take 300 mg by mouth daily.   lisinopril 20 MG tablet Commonly known as: ZESTRIL TAKE 1 TABLET BY MOUTH EVERYDAY AT BEDTIME   metFORMIN 500 MG 24 hr tablet Commonly known as: GLUCOPHAGE-XR TAKE 1 TABLET BY MOUTH EVERY DAY WITH BREAKFAST   multivitamin with minerals Tabs tablet Take 1 tablet by mouth daily.   omeprazole 40 MG capsule Commonly known as: PRILOSEC TAKE 1 CAPSULE BY MOUTH EVERY DAY   rosuvastatin 5 MG tablet Commonly known as: CRESTOR Take 1 tablet (5 mg total) by mouth daily. For cholesterol        Follow-up: No follow-ups on file.  Claretta Fraise, M.D.

## 2020-04-06 NOTE — Progress Notes (Signed)
Hello [place],  Your lab result is normal and/or stable.Some minor variations that are not significant are commonly marked abnormal, but do not represent any medical problem for you.  Best regards, Amron Guerrette, M.D.

## 2020-04-21 DIAGNOSIS — G4733 Obstructive sleep apnea (adult) (pediatric): Secondary | ICD-10-CM | POA: Diagnosis not present

## 2020-05-23 ENCOUNTER — Ambulatory Visit: Payer: Medicare Other | Admitting: Cardiovascular Disease

## 2020-05-23 ENCOUNTER — Other Ambulatory Visit: Payer: Self-pay

## 2020-05-23 ENCOUNTER — Encounter: Payer: Self-pay | Admitting: Cardiovascular Disease

## 2020-05-23 DIAGNOSIS — Z7901 Long term (current) use of anticoagulants: Secondary | ICD-10-CM | POA: Diagnosis not present

## 2020-05-23 DIAGNOSIS — E118 Type 2 diabetes mellitus with unspecified complications: Secondary | ICD-10-CM | POA: Diagnosis not present

## 2020-05-23 DIAGNOSIS — G4733 Obstructive sleep apnea (adult) (pediatric): Secondary | ICD-10-CM

## 2020-05-23 DIAGNOSIS — I482 Chronic atrial fibrillation, unspecified: Secondary | ICD-10-CM

## 2020-05-23 DIAGNOSIS — I10 Essential (primary) hypertension: Secondary | ICD-10-CM

## 2020-05-23 DIAGNOSIS — Z9889 Other specified postprocedural states: Secondary | ICD-10-CM | POA: Diagnosis not present

## 2020-05-23 DIAGNOSIS — E7800 Pure hypercholesterolemia, unspecified: Secondary | ICD-10-CM

## 2020-05-23 NOTE — Progress Notes (Signed)
Cardiology Office Note    Date:  05/25/2020   ID:  [name], DOB 01-28-1936, MRN [id number]  PCP:  Claretta Fraise, MD  Cardiologist:  Shelva Majestic, MD (sleep); Dr. Percival Spanish  F/U sleep clinic evaluation  History of Present Illness:  [name] is a 85 y.o. male who presents for a 13 month follow-up sleep clinic evaluation following initiation of CPAP therapy for sleep apnea.  [name] is followed by Dr. Percival Spanish for primary cardiology care.  He has a history of mitral valve repair as well as atrial fibrillation.  He had undergone DC cardioversion, but subsequently, he developed recurrent AF.  Due to concerns for sleep apnea he was referred for a sleep study and was found.  to have severe obstructive sleep apnea with an AHI of 55.4 per hour.  AHI during REM sleep was 57.5 per hour.  He had significant oxygen desaturation to 80% with non-REM and 72% during REM sleep.  There was loud snoring.  CPAP was implemented and was titrated to an optimal pressure of 14 cm.  His CPAP set up date was 08/23/2016 and he has a ResMed air since 10 AutoSet unit.  He has been using a ResMed airFit F 20 medium size mask.  A download was obtained from August 26 to 11/08/2016.  His compliance is excellent at 100%.  He is averaging 8 hours and 39 minutes per night of sleep.  At a set pressure of 14 cm, however, AHI was still mildly elevated at 6.5, with an apnea index of 5.6.  I saw him for initial sleep clinic evaluation in September 2018 at which time he had noted a significant improvement since CPAP initiation.  Previously he had experienced loud snoring and nocturia at least 3-4 times per night.  He now is unaware of any snoring.  Most nights he can sleep without urination but at times he wakes up 1 time per night.  He feels more refreshed.  His sleep is restorative.  He denies any daytime sleepiness,  bruxism, restless legs, hypnogognic hallucinations, or cataplexy.  An Epworth Sleepiness Scale score was  calculated in the office which endorsed at 8 shown below.  Epworth Sleepiness Scale: Situation   Chance of Dozing/Sleeping (0 = never , 1 = slight chance , 2 = moderate chance , 3 = high chance )   sitting and reading 1   watching TV 3   sitting inactive in a public place 0   being a passenger in a motor vehicle for an hour or more 0   lying down in the afternoon 2   sitting and talking to someone 0   sitting quietly after lunch (no alcohol) 2   while stopped for a few minutes in traffic as the driver 0   Total Score  8   I saw him for 1 year follow-up in December 2019 at which time he was doing well.  [name] has continued to derive benefit from CPAP therapy.  He typically goes to bed around 10 PM but often times turns the TV off at 11 PM.  He typically wakes up between 7 or 8 AM.  He believes he is sleeping well.  If situations permit he does take a daily nap.  A new Epworth scale was calculated in the office today and this endorsed at 6.  At times he has had some difficulty with the pressure on the bridge of his nose from his full facemask.  During that  evaluation, a new download was obtained from November 18 through January 31, 2018.  Compliance remained excellent and his auto pressure ranged from 12 to 20 cm of water and AHI was 6.2.  He was requiring almost maximal pressure with his 95 percentile pressure at 19.6.  With calluses on the bridge of his nose secondary to his full facemask I have suggested transition to the new DreamWear technology.  I last saw him in March 2021 at which time he was doing well from a sleep perspective.  He has the DreamWear mask but he has had irritation resulting from the facial cushion.  A new download from February 15 through May 01, 2019 was obtained.  Compliance remains excellent but average sleep duration was 6 hours and 14 minutes.  His CPAP Auto unit was set at a range of 16-20 and again his 95th percentile pressure was near maximum at 19.8.  He  believes he is sleeping well.  He is unaware of breakthrough snoring.  I calculated a new Epworth Sleepiness Scale score today in the office and this endorsed at 5 arguing against residual daytime sleepiness.  He has continued to be on amlodipine 5 mg and lisinopril 20 mg for hypertension.  He has atrial fibrillation with a controlled rate and continues to be on anticoagulation with Eliquis without bleeding.  He is diabetic on metformin.  He has been on low-dose rosuvastatin for hyperlipidemia with target LDL less than 70.   Over the past year, he has continued to do well from a sleep perspective.  I obtained a new download from April 23, 2020 through May 22, 2020 which shows excellent compliance with average CPAP use at 8 hours and 23 minutes.  He has required high pressures with his minimum pressure at 16 and maximal 20 cm, and his 95th percentile pressure is 19.7 with maximum average pressure at 19.9.  An Epworth Sleepiness Scale score was calculated in the office today and this endorsed at 4 arguing against residual daytime sleepiness.  His atrial fibrillation rate has been controlled.  He denies any chest pain.  He is on amlodipine 5 mg, lisinopril 20 mg for hypertension, Eliquis 5 mg twice a day for anticoagulation, and rosuvastatin 5 mg for hyperlipidemia with LDL cholesterol at 44.  He is diabetic on Metformin.  He presents for evaluation.  Past Medical History:  Diagnosis Date  . Arthritis    knees, shoulder,  hips (10/24/2017)  . Atrial fibrillation ([place])    on eliquis  . BPH (benign prostatic hyperplasia)   . Cataracts, bilateral    removed by surgery  . Coronary artery disease excluded   . Diabetes mellitus without complication ([place])    type 2  . Diverticulosis   . Endocarditis, valve unspecified, unspecified cause   . GERD (gastroesophageal reflux disease)   . H/O mitral valve repair    Postoperative ring with postoperative atrial fibrillation  . Hearing loss    both ears - does not  use hearing aids  . Heart murmur    hx  . Hiatal hernia 06/13/2002  . High cholesterol   . History of kidney stones    passed spontaneously x2   . OSA on CPAP    uses cpap nightly  . Stroke ([place])   . Unspecified essential hypertension   . Weakness of both arms 07/19/2012  . Wears dentures    upper and lower partial    Past Surgical History:  Procedure Laterality Date  . CARDIAC CATHETERIZATION  02/14/2007   x 2  . CARDIOVERSION N/A 04/14/2016   Procedure: CARDIOVERSION;  Surgeon: Minus Breeding, MD;  Location[facility] ENDOSCOPY;  Service: Cardiovascular;  Laterality: N/A;  . CATARACT EXTRACTION W/PHACO Right 05/12/2015   Procedure: CATARACT EXTRACTION PHACO AND INTRAOCULAR LENS PLACEMENT RIGHT EYE CDE=7.75;  Surgeon: Tonny Branch, MD;  Location: [facility] ORS;  Service: Ophthalmology;  Laterality: Right;  . CATARACT EXTRACTION W/PHACO Left 06/12/2015   Procedure: CATARACT EXTRACTION PHACO AND INTRAOCULAR LENS PLACEMENT (IOC);  Surgeon: Tonny Branch, MD;  Location: [facility] ORS;  Service: Ophthalmology;  Laterality: Left;  CDE:  13.17  . COLONOSCOPY    . EYE SURGERY     Bilateral cataracts  . INJECTION KNEE Right 02/20/2016   Procedure: KNEE INJECTION;  Surgeon: [name], MD;  Location: [place];  Service: Orthopedics;  Laterality: Right;  . JOINT REPLACEMENT    . KNEE ARTHROSCOPY Left   . LAPAROSCOPIC CHOLECYSTECTOMY    . MITRAL VALVE REPAIR  ~ 2003  . MULTIPLE TOOTH EXTRACTIONS    . SHOULDER ARTHROSCOPY WITH ROTATOR CUFF REPAIR AND OPEN BICEPS TENODESIS Right 01/26/2019   Procedure: right shoulder arthroscopy, rotator cuff repair and biceps tenodesis;  Surgeon: [name], MD;  Location: [place];  Service: Orthopedics;  Laterality: Right;  . SHOULDER OPEN ROTATOR CUFF REPAIR Left   . TOTAL KNEE ARTHROPLASTY Left 02/20/2016   Procedure: LEFT TOTAL KNEE ARTHROPLASTY WITH RIGHT KNEE INJECTION;  Surgeon: [name], MD;  Location: [place];  Service: Orthopedics;  Laterality: Left;  . TOTAL KNEE ARTHROPLASTY  Right 07/06/2019   Procedure: RIGHT TOTAL KNEE ARTHROPLASTY;  Surgeon: [name], MD;  Location: [place];  Service: Orthopedics;  Laterality: Right;  . UPPER GI ENDOSCOPY    . VASECTOMY      Current Medications: Outpatient Medications Prior to Visit  Medication Sig Dispense Refill  . amLODipine (NORVASC) 5 MG tablet Take 1 tablet (5 mg total) by mouth daily. 90 tablet 1  . apixaban (ELIQUIS) 5 MG TABS tablet Take 1 tablet (5 mg total) by mouth 2 (two) times daily. 180 tablet 1  . Calcium Carb-Cholecalciferol (CALCIUM 600 + D PO) Take 1 tablet by mouth daily.    . Coenzyme Q10 300 MG CAPS Take 300 mg by mouth daily.    Marland Kitchen lisinopril (ZESTRIL) 20 MG tablet TAKE 1 TABLET BY MOUTH EVERYDAY AT BEDTIME 90 tablet 1  . metFORMIN (GLUCOPHAGE-XR) 500 MG 24 hr tablet TAKE 1 TABLET BY MOUTH EVERY DAY WITH BREAKFAST 90 tablet 1  . Multiple Vitamin (MULTIVITAMIN WITH MINERALS) TABS tablet Take 1 tablet by mouth daily.    Marland Kitchen omeprazole (PRILOSEC) 40 MG capsule TAKE 1 CAPSULE BY MOUTH EVERY DAY 90 capsule 1  . rosuvastatin (CRESTOR) 5 MG tablet Take 1 tablet (5 mg total) by mouth daily. For cholesterol 90 tablet 0   No facility-administered medications prior to visit.     Allergies:   Flomax [tamsulosin hcl]   Social History   Socioeconomic History  . Marital status: Married    Spouse name: GLORIA  . Number of children: 2  . Years of education: 17  . Highest education level: High school graduate  Occupational History  . Occupation:  Environmental manager    Comment: RETIRED  Tobacco Use  . Smoking status: Former Smoker    Packs/day: 3.00    Years: 35.00    Pack years: 105.00    Types: Cigarettes    Quit date: 02/16/1988    Years since  quitting: 32.2  . Smokeless tobacco: Never Used  . Tobacco comment:  Year Quit: 1990   Vaping Use  . Vaping Use: Never used  Substance and Sexual Activity  . Alcohol use: No  . Drug use: Never  . Sexual activity: Not Currently  Other Topics Concern  . Not  on file  Social History Narrative  . Not on file   Social Determinants of Health   Financial Resource Strain: Low Risk   . Difficulty of Paying Living Expenses: Not hard at all  Food Insecurity: No Food Insecurity  . Worried About Charity fundraiser in the Last Year: Never true  . Ran Out of Food in the Last Year: Never true  Transportation Needs: No Transportation Needs  . Lack of Transportation (Medical): No  . Lack of Transportation (Non-Medical): No  Physical Activity: Insufficiently Active  . Days of Exercise per Week: 2 days  . Minutes of Exercise per Session: 60 min  Stress: No Stress Concern Present  . Feeling of Stress : Not at all  Social Connections: Socially Integrated  . Frequency of Communication with Friends and Family: More than three times a week  . Frequency of Social Gatherings with Friends and Family: Once a week  . Attends Religious Services: More than 4 times per year  . Active Member of Clubs or Organizations: Yes  . Attends Archivist Meetings: More than 4 times per year  . Marital Status: Married     Family History:  The patient's family history includes Congestive Heart Failure in his father; Diabetes Mellitus II in his brother and sister; Heart disease in his father; Lung cancer in his brother; Stroke in his brother, brother, sister, and sister.   ROS General: Negative; No fevers, chills, or night sweats;  HEENT: Negative; No changes in vision or hearing, sinus congestion, difficulty swallowing Pulmonary: Negative; No cough, wheezing, shortness of breath, hemoptysis Cardiovascular: Status post mitral valve repair.  Atrial fibrillation GI: Negative; No nausea, vomiting, diarrhea, or abdominal pain GU: Negative; No dysuria, hematuria, or difficulty voiding Musculoskeletal: Negative; no myalgias, joint pain, or weakness Hematologic/Oncology: Negative; no easy bruising, bleeding Endocrine: Negative; no heat/cold intolerance; no diabetes Neuro:  Negative; no changes in balance, headaches Skin: Negative; No rashes or skin lesions Psychiatric: Negative; No behavioral problems, depression Sleep: See history of present illness  Other comprehensive 14 point system review is negative.   PHYSICAL EXAM:   VS:  BP 128/60 (BP Location: Left Arm, Patient Position: Sitting, Cuff Size: Normal)   Pulse 62   Ht '5\' 11"'  (1.803 m)   Wt 235 lb (106.6 kg)   BMI 32.78 kg/m     Repeat blood pressure by me 130/68  Wt Readings from Last 3 Encounters:  05/23/20 235 lb (106.6 kg)  04/02/20 233 lb 8 oz (105.9 kg)  02/28/20 241 lb (109.3 kg)    General: Alert, oriented, no distress.  Skin: normal turgor, no rashes, warm and dry HEENT: Normocephalic, atraumatic. Pupils equal round and reactive to light; sclera anicteric; extraocular muscles intact;  Nose without nasal septal hypertrophy Mouth/Parynx benign; Mallinpatti scale 3 Neck: No JVD, no carotid bruits; normal carotid upstroke Lungs: clear to ausculatation and percussion; no wheezing or rales Chest wall: without tenderness to palpitation Heart: PMI not displaced, AF rate controlled in the 60s, s1 s2 normal, 1/6 systolic murmur, no diastolic murmur, no rubs, gallops, thrills, or heaves Abdomen: soft, nontender; no hepatosplenomehaly, BS+; abdominal aorta nontender and not dilated by palpation. Back:  no CVA tenderness Pulses 2+ Musculoskeletal: full range of motion, normal strength, no joint deformities Extremities: no clubbing cyanosis or edema, Homan's sign negative  Neurologic: grossly nonfocal; Cranial nerves grossly wnl Psychologic: Normal mood and affect   Studies/Labs Reviewed:   EKG:  EKG is ordered today. ECG (independently read by me): Atrial fibrillation at 62, right axis deviation  I personally reviewed the most recent ECG done by Dr. Percival Spanish from December 01, 2018 which showed atrial fibrillation at 66 bpm; QTc interval 413 ms  Septemper 2018 ECG (independently read by me):   Atrial fibrillation at 63 bpm.  QTc interval 407 msec  Recent Labs: BMP Latest Ref Rng & Units 04/02/2020 02/25/2020 01/01/2020  Glucose 65 - 99 mg/dL 118(H) 142(H) 133(H)  BUN 8 - 27 mg/dL '15 16 15  ' Creatinine 0.76 - 1.27 mg/dL 0.81 0.81 0.86  BUN/Creat Ratio 10 - 24 19 - 17  Sodium 134 - 144 mmol/L 142 138 142  Potassium 3.5 - 5.2 mmol/L 4.2 3.7 4.9  Chloride 96 - 106 mmol/L 103 103 103  CO2 20 - 29 mmol/L '26 28 25  ' Calcium 8.6 - 10.2 mg/dL 8.9 8.9 9.4     Hepatic Function Latest Ref Rng & Units 04/02/2020 01/01/2020 10/01/2019  Total Protein 6.0 - 8.5 g/dL 6.4 7.2 7.1  Albumin 3.6 - 4.6 g/dL 4.3 4.5 4.3  AST 0 - 40 IU/L '15 19 19  ' ALT 0 - 44 IU/L '13 17 20  ' Alk Phosphatase 44 - 121 IU/L 82 82 86  Total Bilirubin 0.0 - 1.2 mg/dL 0.6 1.0 0.7    CBC Latest Ref Rng & Units 04/02/2020 02/25/2020 01/01/2020  WBC 3.4 - 10.8 x10E3/uL 6.5 7.5 7.5  Hemoglobin 13.0 - 17.7 g/dL 14.7 14.0 14.6  Hematocrit 37.5 - 51.0 % 43.9 42.9 42.4  Platelets 150 - 450 x10E3/uL 224 228 260   Lab Results  Component Value Date   MCV 92 04/02/2020   MCV 97.3 02/25/2020   MCV 92 01/01/2020   Lab Results  Component Value Date   TSH 2.080 03/21/2019   Lab Results  Component Value Date   HGBA1C 6.4 04/02/2020     BNP    Component Value Date/Time   BNP 75.4 03/21/2019 0921    ProBNP No results found for: PROBNP   Lipid Panel     Component Value Date/Time   CHOL 107 04/02/2020 0938   TRIG 68 04/02/2020 0938   HDL 48 04/02/2020 0938   CHOLHDL 2.2 04/02/2020 0938   CHOLHDL 4.5 10/25/2017 0354   VLDL 37 10/25/2017 0354   LDLCALC 44 04/02/2020 0938     RADIOLOGY: No results found.   Additional studies/ records that were reviewed today include:  I reviewed the records from Dr. Percival Spanish.  I reviewed his prior sleep study and obtain a new download to assess efficacy and compliance and obtain a new Epworth Sleepiness Scale score today.  ASSESSMENT:    1. OSA (obstructive sleep apnea)    2. Chronic atrial fibrillation (HCC)   3. Essential hypertension   4. Anticoagulation adequate   5. H/O mitral valve repair   6. Type 2 diabetes mellitus with complication, without long-term current use of insulin (HCC)   7. Pure hypercholesterolemia    PLAN:  [name] is a very pleasant 85 year-old gentleman who has cardiovascular comorbidities including mitral valve repair, permanent atrial fibrillation, hypertension, and mild carotid plaque.  He had undergone cardioversion for atrial fibrillation, but unfortunately develop recurrent atrial fibrillation.  He was found to have severe obstructive sleep apnea on the baseline portion of his split-night sleep evaluation with significant oxygen desaturation to a nadir of 72% during REM sleep.  He has been on CPAP therapy since his set up date of August 23, 2016.  He is essentially at maximum CPAP pressure and consistently has had 95th percentile pressure at 19.7-19.8 over the last several years with maximum pressure at 19.9.  AHI on his most recent download is 3.3.  I am changing his ramp start pressure to 12 and will slightly change his auto pressure such that he will initiate CPAP at 17 up to its maximum of 20.  We discussed if he does in the future require higher pressures we will need to transition him to BiPAP therapy which has a maximum IPAP pressure of 25 cm of water.  He is using the ResMed air fit F 30i which is doing well and which was switched at his last evaluation.  He is unaware of any breakthrough snoring.  On his download there is no mask leak.  His blood pressure today is stable on lisinopril 20 mg and amlodipine 5 mg.  His lipid studies are excellent with an LDL of 44 on rosuvastatin 5 mg.  Atrial fibrillation rate is well controlled and he is tolerating Eliquis 5 mg twice a day without bleeding.  He is diabetic on metformin.  I will see him in 1 year for reevaluation.   Medication Adjustments/Labs and Tests Ordered: Current medicines are  reviewed at length with the patient today.  Concerns regarding medicines are outlined above.  Medication changes, Labs and Tests ordered today are listed in the Patient Instructions below. Patient Instructions  Medication Instructions:  The current medical regimen is effective;  continue present plan and medications.  *If you need a refill on your cardiac medications before your next appointment, please call your pharmacy*   Follow-Up: At [facility], you and your health needs are our priority.  As part of our continuing mission to provide you with exceptional heart care, we have created designated Provider Care Teams.  These Care Teams include your primary Cardiologist (physician) and Advanced Practice Providers (APPs -  Physician Assistants and Nurse Practitioners) who all work together to provide you with the care you need, when you need it.  We recommend signing up for the patient portal called "MyChart".  Sign up information is provided on this After Visit Summary.  MyChart is used to connect with patients for Virtual Visits (Telemedicine).  Patients are able to view lab/test results, encounter notes, upcoming appointments, etc.  Non-urgent messages can be sent to your provider as well.   To learn more about what you can do with MyChart, go to [email or web address].    Your next appointment:   12 month(s)  The format for your next appointment:   In Person  Provider:   Shelva Majestic, MD        Signed, Shelva Majestic, MD  05/25/2020 [place] [address] [place], [place], [place]  [address] Phone: [phone]

## 2020-05-23 NOTE — Patient Instructions (Signed)

## 2020-05-25 ENCOUNTER — Encounter: Payer: Self-pay | Admitting: Cardiovascular Disease

## 2020-07-01 ENCOUNTER — Other Ambulatory Visit: Payer: Self-pay

## 2020-07-01 ENCOUNTER — Other Ambulatory Visit: Payer: Self-pay | Admitting: Family Medicine

## 2020-07-01 ENCOUNTER — Encounter: Payer: Self-pay | Admitting: Family Medicine

## 2020-07-01 ENCOUNTER — Ambulatory Visit (INDEPENDENT_AMBULATORY_CARE_PROVIDER_SITE_OTHER): Payer: Medicare Other | Admitting: Family Medicine

## 2020-07-01 VITALS — Ht 71.0 in

## 2020-07-01 DIAGNOSIS — I10 Essential (primary) hypertension: Secondary | ICD-10-CM

## 2020-07-01 DIAGNOSIS — E119 Type 2 diabetes mellitus without complications: Secondary | ICD-10-CM | POA: Diagnosis not present

## 2020-07-01 DIAGNOSIS — E785 Hyperlipidemia, unspecified: Secondary | ICD-10-CM

## 2020-07-01 DIAGNOSIS — E1140 Type 2 diabetes mellitus with diabetic neuropathy, unspecified: Secondary | ICD-10-CM | POA: Diagnosis not present

## 2020-07-01 LAB — BAYER DCA HB A1C WAIVED: HB A1C (BAYER DCA - WAIVED): 6.7 % (ref <7.0)

## 2020-07-01 MED ORDER — METFORMIN HCL ER 500 MG PO TB24: ORAL_TABLET | ORAL | 1 refills | Status: DC

## 2020-07-01 MED ORDER — ROSUVASTATIN CALCIUM 5 MG PO TABS: 5.0000 mg | ORAL_TABLET | Freq: Every day | ORAL | 1 refills | Status: DC

## 2020-07-01 MED ORDER — AMLODIPINE BESYLATE 5 MG PO TABS: 5.0000 mg | ORAL_TABLET | Freq: Every day | ORAL | 1 refills | Status: DC

## 2020-07-01 MED ORDER — OMEPRAZOLE 40 MG PO CPDR: 40.0000 mg | DELAYED_RELEASE_CAPSULE | Freq: Every day | ORAL | 1 refills | Status: DC

## 2020-07-01 MED ORDER — LISINOPRIL 20 MG PO TABS: ORAL_TABLET | ORAL | 1 refills | Status: DC

## 2020-07-01 MED ORDER — ELIQUIS 5 MG PO TABS: 5.0000 mg | ORAL_TABLET | Freq: Two times a day (BID) | ORAL | 1 refills | Status: DC

## 2020-07-01 NOTE — Progress Notes (Signed)
 Subjective:  Patient ID: [name],  male    DOB: 08/18/1935  Age: 85 y.o.    CC: Medical Management of Chronic Issues   HPI Chael A Leyh presents for  follow-up of hypertension. Patient has no history of headache chest pain or shortness of breath or recent cough. Patient also denies symptoms of TIA such as numbness weakness lateralizing. Patient denies side effects from medication. States taking it regularly.  Patient also  in for follow-up of elevated cholesterol. Doing well without complaints on current medication. Denies side effects  including myalgia and arthralgia and nausea. Also in today for liver function testing. Currently no chest pain, shortness of breath or other cardiovascular related symptoms noted.  Follow-up of diabetes. Patient does not check blood sugar at home. Patient denies symptoms such as excessive hunger or urinary frequency, excessive hunger, nausea No significant hypoglycemic spells noted. Medications reviewed. Pt reports taking them regularly. Pt. denies complication/adverse reaction today.    History Maclovio has a past medical history of Arthritis, Atrial fibrillation (HCC), BPH (benign prostatic hyperplasia), Cataracts, bilateral, Coronary artery disease excluded, Diabetes mellitus without complication (HCC), Diverticulosis, Endocarditis, valve unspecified, unspecified cause, GERD (gastroesophageal reflux disease), H/O mitral valve repair, Hearing loss, Heart murmur, Hiatal hernia (06/13/2002), High cholesterol, History of kidney stones, OSA on CPAP, Stroke (HCC), Unspecified essential hypertension, Weakness of both arms (07/19/2012), and Wears dentures.   He has a past surgical history that includes Vasectomy; Mitral valve repair (~ 2003); Cataract extraction w/PHACO (Right, 05/12/2015); Cataract extraction w/PHACO (Left, 06/12/2015); Total knee arthroplasty (Left, 02/20/2016); Injection knee (Right, 02/20/2016); Cardioversion (N/A, 04/14/2016); Laparoscopic  cholecystectomy; Knee arthroscopy (Left); Joint replacement; Shoulder open rotator cuff repair (Left); Colonoscopy; Upper gi endoscopy; Multiple tooth extractions; Cardiac catheterization (02/14/2007); Shoulder arthroscopy with rotator cuff repair and open biceps tenodesis (Right, 01/26/2019); Eye surgery; and Total knee arthroplasty (Right, 07/06/2019).   His family history includes Congestive Heart Failure in his father; Diabetes Mellitus II in his brother and sister; Heart disease in his father; Lung cancer in his brother; Stroke in his brother, brother, sister, and sister.He reports that he quit smoking about 32 years ago. His smoking use included cigarettes. He has a 105.00 pack-year smoking history. He has never used smokeless tobacco. He reports that he does not drink alcohol and does not use drugs.  Current Outpatient Medications on File Prior to Visit  Medication Sig Dispense Refill  . Calcium Carb-Cholecalciferol (CALCIUM 600 + D PO) Take 1 tablet by mouth daily.    . Coenzyme Q10 300 MG CAPS Take 300 mg by mouth daily.    . Multiple Vitamin (MULTIVITAMIN WITH MINERALS) TABS tablet Take 1 tablet by mouth daily.     No current facility-administered medications on file prior to visit.    ROS Review of Systems  Constitutional: Negative for fever.  Respiratory: Negative for shortness of breath.   Cardiovascular: Negative for chest pain.  Genitourinary: Positive for difficulty urinating and urgency. Negative for frequency.  Musculoskeletal: Negative for arthralgias.  Skin: Negative for rash.    Objective:  Ht 5' 11" (1.803 m)   BMI 32.78 kg/m   BP Readings from Last 3 Encounters:  05/23/20 128/60  04/02/20 133/80  01/01/20 124/74    Wt Readings from Last 3 Encounters:  05/23/20 235 lb (106.6 kg)  04/02/20 233 lb 8 oz (105.9 kg)  02/28/20 241 lb (109.3 kg)     Physical Exam Vitals reviewed.  Constitutional:      Appearance: He is well-developed.    HENT:     Head:  Normocephalic and atraumatic.     Right Ear: Tympanic membrane and external ear normal. No decreased hearing noted.     Left Ear: Tympanic membrane and external ear normal. No decreased hearing noted.     Mouth/Throat:     Pharynx: No oropharyngeal exudate or posterior oropharyngeal erythema.  Eyes:     Pupils: Pupils are equal, round, and reactive to light.  Cardiovascular:     Rate and Rhythm: Normal rate and regular rhythm.     Heart sounds: No murmur heard.   Pulmonary:     Effort: No respiratory distress.     Breath sounds: Normal breath sounds.  Abdominal:     General: Bowel sounds are normal.     Palpations: Abdomen is soft. There is no mass.     Tenderness: There is no abdominal tenderness.  Musculoskeletal:     Cervical back: Normal range of motion and neck supple.     Diabetic Foot Exam - Simple   Simple Foot Form Diabetic Foot exam was performed with the following findings: Yes 07/01/2020 10:45 AM  Visual Inspection No deformities, no ulcerations, no other skin breakdown bilaterally: Yes Sensation Testing See comments: Yes Pulse Check Posterior Tibialis and Dorsalis pulse intact bilaterally: Yes Comments Diminished sensation at distal foot, 1st toe bilaterally       Assessment & Plan:   Gionni was seen today for medical management of chronic issues.  Diagnoses and all orders for this visit:  Type 2 diabetes mellitus with diabetic neuropathy, without long-term current use of insulin (HCC) -     Bayer DCA Hb A1c Waived -     CMP14+EGFR -     CBC with Differential/Platelet  Dyslipidemia -     Lipid panel  Essential hypertension  Type 2 diabetes mellitus without complication, without long-term current use of insulin (HCC) -     rosuvastatin (CRESTOR) 5 MG tablet; Take 1 tablet (5 mg total) by mouth daily. For cholesterol  Other orders -     omeprazole (PRILOSEC) 40 MG capsule; Take 1 capsule (40 mg total) by mouth daily. -     lisinopril (ZESTRIL)  20 MG tablet; TAKE 1 TABLET BY MOUTH EVERYDAY AT BEDTIME -     metFORMIN (GLUCOPHAGE-XR) 500 MG 24 hr tablet; TAKE 1 TABLET BY MOUTH EVERY DAY WITH BREAKFAST -     amLODipine (NORVASC) 5 MG tablet; Take 1 tablet (5 mg total) by mouth daily. -     apixaban (ELIQUIS) 5 MG TABS tablet; Take 1 tablet (5 mg total) by mouth 2 (two) times daily.   I have changed Teondre A. Better's omeprazole. I am also having him maintain his multivitamin with minerals, Calcium Carb-Cholecalciferol (CALCIUM 600 + D PO), Coenzyme Q10, rosuvastatin, lisinopril, metFORMIN, amLODipine, and Eliquis.  Meds ordered this encounter  Medications  . rosuvastatin (CRESTOR) 5 MG tablet    Sig: Take 1 tablet (5 mg total) by mouth daily. For cholesterol    Dispense:  90 tablet    Refill:  1  . omeprazole (PRILOSEC) 40 MG capsule    Sig: Take 1 capsule (40 mg total) by mouth daily.    Dispense:  90 capsule    Refill:  1  . lisinopril (ZESTRIL) 20 MG tablet    Sig: TAKE 1 TABLET BY MOUTH EVERYDAY AT BEDTIME    Dispense:  90 tablet    Refill:  1  . metFORMIN (GLUCOPHAGE-XR) 500 MG 24 hr tablet  Sig: TAKE 1 TABLET BY MOUTH EVERY DAY WITH BREAKFAST    Dispense:  90 tablet    Refill:  1  . amLODipine (NORVASC) 5 MG tablet    Sig: Take 1 tablet (5 mg total) by mouth daily.    Dispense:  90 tablet    Refill:  1  . apixaban (ELIQUIS) 5 MG TABS tablet    Sig: Take 1 tablet (5 mg total) by mouth 2 (two) times daily.    Dispense:  180 tablet    Refill:  1     Follow-up: Return in about 3 months (around 10/01/2020).  Claretta Fraise, M.D.

## 2020-07-02 LAB — CMP14+EGFR
ALT: 13 IU/L (ref 0–44)
AST: 13 IU/L (ref 0–40)
Albumin/Globulin Ratio: 1.7 (ref 1.2–2.2)
Albumin: 4.4 g/dL (ref 3.6–4.6)
Alkaline Phosphatase: 84 IU/L (ref 44–121)
BUN/Creatinine Ratio: 19 (ref 10–24)
BUN: 16 mg/dL (ref 8–27)
Bilirubin Total: 1.1 mg/dL (ref 0.0–1.2)
CO2: 27 mmol/L (ref 20–29)
Calcium: 8.9 mg/dL (ref 8.6–10.2)
Chloride: 105 mmol/L (ref 96–106)
Creatinine, Ser: 0.84 mg/dL (ref 0.76–1.27)
Globulin, Total: 2.6 g/dL (ref 1.5–4.5)
Glucose: 124 mg/dL — ABNORMAL HIGH (ref 65–99)
Potassium: 4.5 mmol/L (ref 3.5–5.2)
Sodium: 145 mmol/L — ABNORMAL HIGH (ref 134–144)
Total Protein: 7 g/dL (ref 6.0–8.5)
eGFR: 87 mL/min/{1.73_m2} (ref >59)

## 2020-07-02 LAB — LIPID PANEL
Chol/HDL Ratio: 2.3 ratio (ref 0.0–5.0)
Cholesterol, Total: 117 mg/dL (ref 100–199)
HDL: 50 mg/dL (ref >39)
LDL Chol Calc (NIH): 50 mg/dL (ref 0–99)
Triglycerides: 87 mg/dL (ref 0–149)
VLDL Cholesterol Cal: 17 mg/dL (ref 5–40)

## 2020-07-02 LAB — CBC WITH DIFFERENTIAL/PLATELET
Basophils Absolute: 0 10*3/uL (ref 0.0–0.2)
Basos: 0 %
EOS (ABSOLUTE): 0.1 10*3/uL (ref 0.0–0.4)
Eos: 2 %
Hematocrit: 42.7 % (ref 37.5–51.0)
Hemoglobin: 14.4 g/dL (ref 13.0–17.7)
Immature Grans (Abs): 0 10*3/uL (ref 0.0–0.1)
Immature Granulocytes: 0 %
Lymphocytes Absolute: 1.8 10*3/uL (ref 0.7–3.1)
Lymphs: 27 %
MCH: 31 pg (ref 26.6–33.0)
MCHC: 33.7 g/dL (ref 31.5–35.7)
MCV: 92 fL (ref 79–97)
Monocytes Absolute: 0.7 10*3/uL (ref 0.1–0.9)
Monocytes: 10 %
Neutrophils Absolute: 4.2 10*3/uL (ref 1.4–7.0)
Neutrophils: 61 %
Platelets: 220 10*3/uL (ref 150–450)
RBC: 4.64 x10E6/uL (ref 4.14–5.80)
RDW: 13.1 % (ref 11.6–15.4)
WBC: 6.9 10*3/uL (ref 3.4–10.8)

## 2020-07-02 NOTE — Progress Notes (Signed)
Hello Federico,  Your lab result is normal and/or stable.Some minor variations that are not significant are commonly marked abnormal, but do not represent any medical problem for you.  Best regards, Gevork Ayyad, M.D.

## 2020-07-03 DIAGNOSIS — R3915 Urgency of urination: Secondary | ICD-10-CM | POA: Diagnosis not present

## 2020-07-15 ENCOUNTER — Telehealth: Payer: Self-pay | Admitting: *Deleted

## 2020-07-15 NOTE — Telephone Encounter (Signed)
1 year call completed with survey.

## 2020-07-31 DIAGNOSIS — M549 Dorsalgia, unspecified: Secondary | ICD-10-CM | POA: Diagnosis not present

## 2020-08-08 DIAGNOSIS — H8309 Labyrinthitis, unspecified ear: Secondary | ICD-10-CM | POA: Diagnosis not present

## 2020-08-08 DIAGNOSIS — R42 Dizziness and giddiness: Secondary | ICD-10-CM | POA: Diagnosis not present

## 2020-09-15 ENCOUNTER — Encounter: Payer: Self-pay | Admitting: Family

## 2020-09-15 ENCOUNTER — Other Ambulatory Visit: Payer: Self-pay

## 2020-09-15 ENCOUNTER — Ambulatory Visit (INDEPENDENT_AMBULATORY_CARE_PROVIDER_SITE_OTHER): Payer: Medicare Other | Admitting: Family

## 2020-09-15 VITALS — BP 124/76 | HR 67 | Temp 97.6°F | Ht 71.0 in | Wt 235.0 lb

## 2020-09-15 DIAGNOSIS — E1140 Type 2 diabetes mellitus with diabetic neuropathy, unspecified: Secondary | ICD-10-CM

## 2020-09-15 DIAGNOSIS — Z6832 Body mass index (BMI) 32.0-32.9, adult: Secondary | ICD-10-CM | POA: Diagnosis not present

## 2020-09-15 DIAGNOSIS — M545 Low back pain, unspecified: Secondary | ICD-10-CM | POA: Diagnosis not present

## 2020-09-15 DIAGNOSIS — E6609 Other obesity due to excess calories: Secondary | ICD-10-CM

## 2020-09-15 MED ORDER — PREDNISONE 10 MG (21) PO TBPK: ORAL_TABLET | ORAL | 0 refills | Status: DC

## 2020-09-15 MED ORDER — BACLOFEN 5 MG PO TABS: 5.0000 mg | ORAL_TABLET | Freq: Three times a day (TID) | ORAL | 0 refills | Status: DC | PRN

## 2020-09-15 NOTE — Progress Notes (Signed)
Subjective:    Patient ID: [name], male    DOB: February 14, 1936, 85 y.o.   MRN: [id number]  Chief Complaint  Patient presents with   Back Pain    Went to urgent care 6 weeks ago they gave hime shot and prednisone    PT presents to the office today with lower back pain. He reports he went to the Urgent Care on 07/31/20 with the same problem. He was given steroid injection and oral prednisone. He reports he was feeling much better the next day. However, his pain returned two days ago and this morning he could not get out bed. He took a hot shower that mildly helped.   Denies any injury.   He is a diabetic and his last was 6.7.  Back Pain This is a recurrent problem. The current episode started in the past 7 days. The problem has been waxing and waning since onset. The pain is present in the lumbar spine. The quality of the pain is described as aching. The pain is at a severity of 5/10. The pain is moderate. The symptoms are aggravated by twisting. Pertinent negatives include no headaches, paresthesias or weakness. He has tried bed rest (steroids) for the symptoms. The treatment provided mild relief.     Review of Systems  Musculoskeletal:  Positive for back pain.  Neurological:  Negative for weakness, headaches and paresthesias.  All other systems reviewed and are negative.     Objective:   Physical Exam Vitals reviewed.  Constitutional:      General: He is not in acute distress.    Appearance: He is well-developed. He is obese.  HENT:     Head: Normocephalic.  Eyes:     General:        Right eye: No discharge.        Left eye: No discharge.     Pupils: Pupils are equal, round, and reactive to light.  Neck:     Thyroid: No thyromegaly.  Cardiovascular:     Rate and Rhythm: Normal rate and regular rhythm.     Heart sounds: Normal heart sounds. No murmur heard. Pulmonary:     Effort: Pulmonary effort is normal. No respiratory distress.     Breath sounds: Normal breath  sounds. No wheezing.  Abdominal:     General: Bowel sounds are normal. There is no distension.     Palpations: Abdomen is soft.     Tenderness: There is no abdominal tenderness.  Musculoskeletal:        General: Tenderness (low back pain with flexion and extension) present. Normal range of motion.     Cervical back: Normal range of motion and neck supple.  Skin:    General: Skin is warm and dry.     Findings: No erythema or rash.  Neurological:     Mental Status: He is alert and oriented to person, place, and time.     Cranial Nerves: No cranial nerve deficit.     Deep Tendon Reflexes: Reflexes are normal and symmetric.  Psychiatric:        Behavior: Behavior normal.        Thought Content: Thought content normal.        Judgment: Judgment normal.     BP 124/76   Pulse 67   Temp 97.6 F (36.4 C) (Temporal)   Ht '5\' 11"'$  (1.803 m)   Wt 235 lb (106.6 kg)   BMI 32.78 kg/m      Assessment &  Plan:  [name] comes in today with chief complaint of Back Pain (Went to urgent care 6 weeks ago they gave hime shot and prednisone )   Diagnosis and orders addressed:  1. Acute midline low back pain without sciatica - Baclofen 5 MG TABS; Take 5 mg by mouth 3 (three) times daily as needed.  Dispense: 90 tablet; Refill: 0 - predniSONE (STERAPRED UNI-PAK 21 TAB) 10 MG (21) TBPK tablet; Use as directed  Dispense: 21 tablet; Refill: 0  2. Class 1 obesity due to excess calories with serious comorbidity and body mass index (BMI) of 32.0 to 32.9 in adult  3. Type 2 diabetes mellitus with diabetic neuropathy, unspecified whether long term insulin use ([place])   Can not do NSAID's because Eliquis  Strict low carb diet  Sedation precautions discussed   Evelina Dun, FNP

## 2020-09-15 NOTE — Patient Instructions (Signed)
Low Back Sprain or Strain Rehab Ask your health care provider which exercises are safe for you. Do exercises exactly as told by your health care provider and adjust them as directed. It is normal to feel mild stretching, pulling, tightness, or discomfort as you do these exercises. Stop right away if you feel sudden pain or your pain gets worse. Do not begin these exercises until told by your health care provider. Stretching and range-of-motion exercises These exercises warm up your muscles and joints and improve the movement and flexibility of your back. These exercises also help to relieve pain, numbness,and tingling. Lumbar rotation  Lie on your back on a firm surface and bend your knees. Straighten your arms out to your sides so each arm forms a 90-degree angle (right angle) with a side of your body. Slowly move (rotate) both of your knees to one side of your body until you feel a stretch in your lower back (lumbar). Try not to let your shoulders lift off the floor. Hold this position for __________ seconds. Tense your abdominal muscles and slowly move your knees back to the starting position. Repeat this exercise on the other side of your body. Repeat __________ times. Complete this exercise __________ times a day. Single knee to chest  Lie on your back on a firm surface with both legs straight. Bend one of your knees. Use your hands to move your knee up toward your chest until you feel a gentle stretch in your lower back and buttock. Hold your leg in this position by holding on to the front of your knee. Keep your other leg as straight as possible. Hold this position for __________ seconds. Slowly return to the starting position. Repeat with your other leg. Repeat __________ times. Complete this exercise __________ times a day. Prone extension on elbows  Lie on your abdomen on a firm surface (prone position). Prop yourself up on your elbows. Use your arms to help lift your chest up  until you feel a gentle stretch in your abdomen and your lower back. This will place some of your body weight on your elbows. If this is uncomfortable, try stacking pillows under your chest. Your hips should stay down, against the surface that you are lying on. Keep your hip and back muscles relaxed. Hold this position for __________ seconds. Slowly relax your upper body and return to the starting position. Repeat __________ times. Complete this exercise __________ times a day. Strengthening exercises These exercises build strength and endurance in your back. Endurance is theability to use your muscles for a long time, even after they get tired. Pelvic tilt This exercise strengthens the muscles that lie deep in the abdomen. Lie on your back on a firm surface. Bend your knees and keep your feet flat on the floor. Tense your abdominal muscles. Tip your pelvis up toward the ceiling and flatten your lower back into the floor. To help with this exercise, you may place a small towel under your lower back and try to push your back into the towel. Hold this position for __________ seconds. Let your muscles relax completely before you repeat this exercise. Repeat __________ times. Complete this exercise __________ times a day. Alternating arm and leg raises  Get on your hands and knees on a firm surface. If you are on a hard floor, you may want to use padding, such as an exercise mat, to cushion your knees. Line up your arms and legs. Your hands should be directly below your shoulders,   and your knees should be directly below your hips. Lift your left leg behind you. At the same time, raise your right arm and straighten it in front of you. Do not lift your leg higher than your hip. Do not lift your arm higher than your shoulder. Keep your abdominal and back muscles tight. Keep your hips facing the ground. Do not arch your back. Keep your balance carefully, and do not hold your breath. Hold this  position for __________ seconds. Slowly return to the starting position. Repeat with your right leg and your left arm. Repeat __________ times. Complete this exercise __________ times a day. Abdominal set with straight leg raise  Lie on your back on a firm surface. Bend one of your knees and keep your other leg straight. Tense your abdominal muscles and lift your straight leg up, 4-6 inches (10-15 cm) off the ground. Keep your abdominal muscles tight and hold this position for __________ seconds. Do not hold your breath. Do not arch your back. Keep it flat against the ground. Keep your abdominal muscles tense as you slowly lower your leg back to the starting position. Repeat with your other leg. Repeat __________ times. Complete this exercise __________ times a day. Single leg lower with bent knees Lie on your back on a firm surface. Tense your abdominal muscles and lift your feet off the floor, one foot at a time, so your knees and hips are bent in 90-degree angles (right angles). Your knees should be over your hips and your lower legs should be parallel to the floor. Keeping your abdominal muscles tense and your knee bent, slowly lower one of your legs so your toe touches the ground. Lift your leg back up to return to the starting position. Do not hold your breath. Do not let your back arch. Keep your back flat against the ground. Repeat with your other leg. Repeat __________ times. Complete this exercise __________ times a day. Posture and body mechanics Good posture and healthy body mechanics can help to relieve stress in your body's tissues and joints. Body mechanics refers to the movements and positions of your body while you do your daily activities. Posture is part of body mechanics. Good posture means: Your spine is in its natural S-curve position (neutral). Your shoulders are pulled back slightly. Your head is not tipped forward. Follow these guidelines to improve your posture  and body mechanics in youreveryday activities. Standing  When standing, keep your spine neutral and your feet about hip width apart. Keep a slight bend in your knees. Your ears, shoulders, and hips should line up. When you do a task in which you stand in one place for a long time, place one foot up on a stable object that is 2-4 inches (5-10 cm) high, such as a footstool. This helps keep your spine neutral.  Sitting  When sitting, keep your spine neutral and keep your feet flat on the floor. Use a footrest, if necessary, and keep your thighs parallel to the floor. Avoid rounding your shoulders, and avoid tilting your head forward. When working at a desk or a computer, keep your desk at a height where your hands are slightly lower than your elbows. Slide your chair under your desk so you are close enough to maintain good posture. When working at a computer, place your monitor at a height where you are looking straight ahead and you do not have to tilt your head forward or downward to look at the screen.    Resting When lying down and resting, avoid positions that are most painful for you. If you have pain with activities such as sitting, bending, stooping, or squatting, lie in a position in which your body does not bend very much. For example, avoid curling up on your side with your arms and knees near your chest (fetal position). If you have pain with activities such as standing for a long time or reaching with your arms, lie with your spine in a neutral position and bend your knees slightly. Try the following positions: Lying on your side with a pillow between your knees. Lying on your back with a pillow under your knees. Lifting  When lifting objects, keep your feet at least shoulder width apart and tighten your abdominal muscles. Bend your knees and hips and keep your spine neutral. It is important to lift using the strength of your legs, not your back. Do not lock your knees straight  out. Always ask for help to lift heavy or awkward objects.  This information is not intended to replace advice given to you by your health care provider. Make sure you discuss any questions you have with your healthcare provider. Document Revised: 05/26/2018 Document Reviewed: 02/23/2018 [employer] Patient Education  2022 [employer]  

## 2020-09-22 ENCOUNTER — Encounter: Payer: Self-pay | Admitting: Family

## 2020-09-22 ENCOUNTER — Other Ambulatory Visit: Payer: Self-pay

## 2020-09-22 ENCOUNTER — Ambulatory Visit (INDEPENDENT_AMBULATORY_CARE_PROVIDER_SITE_OTHER): Payer: Medicare Other | Admitting: Family

## 2020-09-22 ENCOUNTER — Ambulatory Visit (INDEPENDENT_AMBULATORY_CARE_PROVIDER_SITE_OTHER): Payer: Medicare Other

## 2020-09-22 VITALS — BP 127/75 | HR 74 | Temp 97.8°F | Wt 229.0 lb

## 2020-09-22 DIAGNOSIS — M545 Low back pain, unspecified: Secondary | ICD-10-CM

## 2020-09-22 MED ORDER — DICLOFENAC SODIUM 1 % EX GEL: 2.0000 g | Freq: Four times a day (QID) | CUTANEOUS | 2 refills | Status: DC

## 2020-09-22 NOTE — Patient Instructions (Signed)
Acute Back Pain, Adult Acute back pain is sudden and usually short-lived. It is often caused by an injury to the muscles and tissues in the back. The injury may result from: A muscle or ligament getting overstretched or torn (strained). Ligaments are tissues that connect bones to each other. Lifting something improperly can cause a back strain. Wear and tear (degeneration) of the spinal disks. Spinal disks are circular tissue that provide cushioning between the bones of the spine (vertebrae). Twisting motions, such as while playing sports or doing yard work. A hit to the back. Arthritis. You may have a physical exam, lab tests, and imaging tests to find the cause ofyour pain. Acute back pain usually goes away with rest and home care. Follow these instructions at home: Managing pain, stiffness, and swelling Treatment may include medicines for pain and inflammation that are taken by mouth or applied to the skin, prescription pain medicine, or muscle relaxants. Take over-the-counter and prescription medicines only as told by your health care provider. Your health care provider may recommend applying ice during the first 24-48 hours after your pain starts. To do this: Put ice in a plastic bag. Place a towel between your skin and the bag. Leave the ice on for 20 minutes, 2-3 times a day. If directed, apply heat to the affected area as often as told by your health care provider. Use the heat source that your health care provider recommends, such as a moist heat pack or a heating pad. Place a towel between your skin and the heat source. Leave the heat on for 20-30 minutes. Remove the heat if your skin turns bright red. This is especially important if you are unable to feel pain, heat, or cold. You have a greater risk of getting burned. Activity  Do not stay in bed. Staying in bed for more than 1-2 days can delay your recovery. Sit up and stand up straight. Avoid leaning forward when you sit or  hunching over when you stand. If you work at a desk, sit close to it so you do not need to lean over. Keep your chin tucked in. Keep your neck drawn back, and keep your elbows bent at a 90-degree angle (right angle). Sit high and close to the steering wheel when you drive. Add lower back (lumbar) support to your car seat, if needed. Take short walks on even surfaces as soon as you are able. Try to increase the length of time you walk each day. Do not sit, drive, or stand in one place for more than 30 minutes at a time. Sitting or standing for long periods of time can put stress on your back. Do not drive or use heavy machinery while taking prescription pain medicine. Use proper lifting techniques. When you bend and lift, use positions that put less stress on your back: Bend your knees. Keep the load close to your body. Avoid twisting. Exercise regularly as told by your health care provider. Exercising helps your back heal faster and helps prevent back injuries by keeping muscles strong and flexible. Work with a physical therapist to make a safe exercise program, as recommended by your health care provider. Do any exercises as told by your physical therapist.  Lifestyle Maintain a healthy weight. Extra weight puts stress on your back and makes it difficult to have good posture. Avoid activities or situations that make you feel anxious or stressed. Stress and anxiety increase muscle tension and can make back pain worse. Learn ways to manage   anxiety and stress, such as through exercise. General instructions Sleep on a firm mattress in a comfortable position. Try lying on your side with your knees slightly bent. If you lie on your back, put a pillow under your knees. Follow your treatment plan as told by your health care provider. This may include: Cognitive or behavioral therapy. Acupuncture or massage therapy. Meditation or yoga. Contact a health care provider if: You have pain that is not  relieved with rest or medicine. You have increasing pain going down into your legs or buttocks. Your pain does not improve after 2 weeks. You have pain at night. You lose weight without trying. You have a fever or chills. Get help right away if: You develop new bowel or bladder control problems. You have unusual weakness or numbness in your arms or legs. You develop nausea or vomiting. You develop abdominal pain. You feel faint. Summary Acute back pain is sudden and usually short-lived. Use proper lifting techniques. When you bend and lift, use positions that put less stress on your back. Take over-the-counter and prescription medicines and apply heat or ice as directed by your health care provider. This information is not intended to replace advice given to you by your health care provider. Make sure you discuss any questions you have with your healthcare provider. Document Revised: 10/23/2019 Document Reviewed: 10/26/2019 Elsevier Patient Education  2022 [employer]  

## 2020-09-22 NOTE — Progress Notes (Signed)
Subjective:    Patient ID: [name], male    DOB: [birth year], 85 y.o.   MRN: [id number]  Chief Complaint  Patient presents with   continue back pain   Pt presents to the office today with recurrent right sided back pain. He was seen on 09/15/20 and given prednisone. He reports he was feeling better, but started having pain again 09/19/20 and last night the pain was worse. To the point it was hard to sleep.   He is unable to take NSAIDs because he taking Eliquis.   Has follow up with Ortho on 09/25/20.  Back Pain This is a recurrent problem. The current episode started yesterday. The problem has been waxing and waning since onset. The pain is present in the lumbar spine. The quality of the pain is described as aching. The pain does not radiate. The pain is at a severity of 6/10. The pain is moderate. The symptoms are aggravated by lying down. Pertinent negatives include no leg pain, paresthesias or tingling. He has tried NSAIDs for the symptoms. The treatment provided mild relief.     Review of Systems  Musculoskeletal:  Positive for back pain.  Neurological:  Negative for tingling and paresthesias.  All other systems reviewed and are negative.     Objective:   Physical Exam Vitals reviewed.  Constitutional:      General: He is not in acute distress.    Appearance: He is well-developed. He is obese.  HENT:     Head: Normocephalic.  Eyes:     General:        Right eye: No discharge.        Left eye: No discharge.     Pupils: Pupils are equal, round, and reactive to light.  Neck:     Thyroid: No thyromegaly.  Cardiovascular:     Rate and Rhythm: Normal rate and regular rhythm.     Heart sounds: Normal heart sounds. No murmur heard. Pulmonary:     Effort: Pulmonary effort is normal. No respiratory distress.     Breath sounds: Normal breath sounds. No wheezing.  Abdominal:     General: Bowel sounds are normal. There is no distension.     Palpations: Abdomen is soft.      Tenderness: There is no abdominal tenderness.  Musculoskeletal:        General: Tenderness present. Normal range of motion.     Cervical back: Normal range of motion and neck supple.       Back:     Comments: Pain in right lumbar with flexion and extension   Skin:    General: Skin is warm and dry.     Findings: No erythema or rash.  Neurological:     Mental Status: He is alert and oriented to person, place, and time.     Cranial Nerves: No cranial nerve deficit.     Deep Tendon Reflexes: Reflexes are normal and symmetric.  Psychiatric:        Behavior: Behavior normal.        Thought Content: Thought content normal.        Judgment: Judgment normal.       BP 127/75   Pulse 74   Temp 97.8 F (36.6 C) (Temporal)   Wt 229 lb (103.9 kg)   SpO2 96%   BMI 31.94 kg/m   Assessment & Plan:  [name] comes in today with chief complaint of continue back pain   Diagnosis and orders  addressed:  1. Acute right-sided low back pain without sciatica Rest Ice ROM exercises  No other NSAID"s Keep ortho appt Continue baclofen as needed- sedation precautions discussed  - DG Lumbar Spine 2-3 Views; Future - diclofenac Sodium (VOLTAREN) 1 % GEL; Apply 2 g topically 4 (four) times daily.  Dispense: 300 g; Refill: [place], FNP

## 2020-09-25 ENCOUNTER — Ambulatory Visit (INDEPENDENT_AMBULATORY_CARE_PROVIDER_SITE_OTHER): Payer: Medicare Other | Admitting: Orthopaedic Surgery

## 2020-09-25 ENCOUNTER — Other Ambulatory Visit: Payer: Self-pay

## 2020-09-25 DIAGNOSIS — M48061 Spinal stenosis, lumbar region without neurogenic claudication: Secondary | ICD-10-CM

## 2020-09-26 NOTE — Progress Notes (Signed)
Office Visit Note   Patient: [name]           Date of Birth: 06-16-1935           MRN: [id number] Visit Date: 09/25/2020              Requested by: Claretta Fraise, MD [place],  [place] [id number] PCP: Claretta Fraise, MD   Assessment & Plan: Visit Diagnoses:  1. Spinal stenosis of lumbar region, unspecified whether neurogenic claudication present     Plan: Patient has had multiple visits to urgent care dating back to May with his back.  He was seen in June also PCP in August for several visits.  He has had gradual increasing lower extremity pain with standing and walking.  Patient will use a cane to decrease fall risk we will need to proceed with lumbar MRI scan.  He has multilevel disc space narrowing and significant spurring and likely has some stenosis with his progressive weakness.  Office follow-up after MRI.  Follow-Up Instructions: Return after MRI scan.  Orders:  Orders Placed This Encounter  Procedures   MR Lumbar Spine w/o contrast   No orders of the defined types were placed in this encounter.     Procedures: No procedures performed   Clinical Data: No additional findings.   Subjective: Chief Complaint  Patient presents with   Lower Back - Pain    HPI 85 year old male with right hip and leg pain no known injury.  Pain got worse last 2 to 3 weeks with weakness in his legs.  He gets relief when he stops and sits.  He is taking muscle relaxants which has not really helped.  He states the pain is constant it is hard to walk in the community is having trouble sleeping.  He has not taken prednisone due to his diabetes.  He is taking diclofenac also baclofen and Tylenol without relief.  He is on chronic Eliquis.   Patient's had problems with the carotid stenosis mitral valve repair total knee arthroplasty on the left.  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  Rotator cuff surgery.  Review of Systems negative for chills or fever no  associated headaches.   Objective: Vital Signs: There were no vitals taken for this visit.  Physical Exam Constitutional:      Appearance: He is well-developed.  HENT:     Head: Normocephalic and atraumatic.     Right Ear: External ear normal.     Left Ear: External ear normal.  Eyes:     Pupils: Pupils are equal, round, and reactive to light.  Neck:     Thyroid: No thyromegaly.     Trachea: No tracheal deviation.  Cardiovascular:     Rate and Rhythm: Normal rate.  Pulmonary:     Effort: Pulmonary effort is normal.     Breath sounds: No wheezing.  Abdominal:     General: Bowel sounds are normal.     Palpations: Abdomen is soft.  Musculoskeletal:     Cervical back: Neck supple.  Skin:    General: Skin is warm and dry.     Capillary Refill: Capillary refill takes less than 2 seconds.  Neurological:     Mental Status: He is alert and oriented to person, place, and time.  Psychiatric:        Behavior: Behavior normal.        Thought Content: Thought content normal.  Judgment: Judgment normal.    Ortho Exam patient has negative logroll the hips well-healed knee incisions midline.  Slight decrease sensation L5 distribution right foot dorsum of the foot.  Trace EHL weakness on the right normal on the left anterior tib strong and symmetrical no gastrocsoleus weakness.  Specialty Comments:  No specialty comments available.  Imaging: Narrative & Impression  CLINICAL DATA:  Right-sided back pain.   EXAM: LUMBAR SPINE - 2-3 VIEW   COMPARISON:  Abdominal radiograph dated 04/11/2018.   FINDINGS: Five lumbar type vertebra. There is no acute fracture or subluxation of the lumbar spine. The bones are osteopenic. Multilevel degenerative changes with disc space narrowing and spurring, progressed since the prior radiograph. There is straightening of normal lumbar lordosis. Lower lumbar facet arthropathy. There is atherosclerotic calcification of the abdominal aorta. Right  upper quadrant cholecystectomy clips.   IMPRESSION: 1. No acute fracture or subluxation of the lumbar spine. 2. Degenerative changes, progressed since the prior radiograph. 3.  Aortic Atherosclerosis (ICD10-I70.0).         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[facility]) 10/25/2017   Diplopia 10/24/2017   S/P total knee arthroplasty, left 04/29/2016   Weakness of both arms 07/19/2012   Colon cancer screening 06/22/2012   Unspecified venous (peripheral) insufficiency 05/05/2012   Chronic venous insufficiency 04/07/2012   Other malaise and fatigue 03/16/2012   Disturbance of skin sensation 03/16/2012   Carotid stenosis 08/16/2011   Obesity 08/16/2011   H/O mitral valve repair    Dyslipidemia 09/23/2010   Essential hypertension 04/06/2007   Sleep apnea 04/06/2007   Past Medical History:  Diagnosis Date   Arthritis    knees, shoulder,  hips (10/24/2017)   Atrial fibrillation (HCC)    on eliquis   BPH (benign prostatic hyperplasia)    Cataracts, bilateral    removed by surgery   Coronary artery disease excluded    Diabetes mellitus without complication (HCC)    type 2   Diverticulosis    Endocarditis, valve unspecified, unspecified cause    GERD (gastroesophageal reflux disease)    H/O mitral valve repair    Postoperative ring with postoperative atrial fibrillation   Hearing loss    both ears - does not use hearing aids   Heart murmur    hx   Hiatal hernia 06/13/2002    High cholesterol    History of kidney stones    passed spontaneously x2    OSA on CPAP    uses cpap nightly   Stroke ([facility])    Unspecified essential hypertension    Weakness of both arms 07/19/2012   Wears dentures    upper and lower partial    Family History  Problem Relation Age of Onset   Congestive Heart Failure Father    Heart disease Father    Stroke Brother    Stroke Sister    Diabetes Mellitus II Brother    Diabetes Mellitus II Sister    Lung cancer Brother    Stroke Sister    Stroke Brother  Colon cancer Neg Hx    Esophageal cancer Neg Hx    Rectal cancer Neg Hx    Stomach cancer Neg Hx     Past Surgical History:  Procedure Laterality Date   CARDIAC CATHETERIZATION  02/14/2007   x 2   CARDIOVERSION N/A 04/14/2016   Procedure: CARDIOVERSION;  Surgeon: Minus Breeding, MD;  [facility] ENDOSCOPY;  Service: Cardiovascular;  Laterality: N/A;   CATARACT EXTRACTION W/PHACO Right 05/12/2015   Procedure: CATARACT EXTRACTION PHACO AND INTRAOCULAR LENS PLACEMENT RIGHT EYE CDE=7.75;  Surgeon: Tonny Branch, MD;  Location: [facility] ORS;  Service: Ophthalmology;  Laterality: Right;   CATARACT EXTRACTION W/PHACO Left 06/12/2015   Procedure: CATARACT EXTRACTION PHACO AND INTRAOCULAR LENS PLACEMENT (IOC);  Surgeon: Tonny Branch, MD;  Location: [facility];  Service: Ophthalmology;  Laterality: Left;  CDE:  13.17   COLONOSCOPY     EYE SURGERY     Bilateral cataracts   INJECTION KNEE Right 02/20/2016   Procedure: KNEE INJECTION;  Surgeon: [name], MD;  Location: [facility];  Service: Orthopedics;  Laterality: Right;   JOINT REPLACEMENT     KNEE ARTHROSCOPY Left    LAPAROSCOPIC CHOLECYSTECTOMY     MITRAL VALVE REPAIR  ~ 2003   MULTIPLE TOOTH EXTRACTIONS     SHOULDER ARTHROSCOPY WITH ROTATOR CUFF REPAIR AND OPEN BICEPS TENODESIS Right 01/26/2019   Procedure: right shoulder arthroscopy, rotator cuff repair and biceps tenodesis;  Surgeon: [name], MD;  Location: [facility];  Service: Orthopedics;   Laterality: Right;   SHOULDER OPEN ROTATOR CUFF REPAIR Left    TOTAL KNEE ARTHROPLASTY Left 02/20/2016   Procedure: LEFT TOTAL KNEE ARTHROPLASTY WITH RIGHT KNEE INJECTION;  Surgeon: [name], MD;  Location: [facility];  Service: Orthopedics;  Laterality: Left;   TOTAL KNEE ARTHROPLASTY Right 07/06/2019   Procedure: RIGHT TOTAL KNEE ARTHROPLASTY;  Surgeon: [name], MD;  Location: [facility];  Service: Orthopedics;  Laterality: Right;   UPPER GI ENDOSCOPY     VASECTOMY     Social History   Occupational History   Occupation:  Environmental manager    Comment: RETIRED  Tobacco Use   Smoking status: Former    Packs/day: 3.00    Years: 35.00    Pack years: 105.00    Types: Cigarettes    Quit date: 02/16/1988    Years since quitting: 32.6   Smokeless tobacco: Never   Tobacco comments:     Year Quit: 1990   Vaping Use   Vaping Use: Never used  Substance and Sexual Activity   Alcohol use: No   Drug use: Never   Sexual activity: Not Currently

## 2020-10-01 ENCOUNTER — Other Ambulatory Visit: Payer: Self-pay

## 2020-10-01 ENCOUNTER — Ambulatory Visit (INDEPENDENT_AMBULATORY_CARE_PROVIDER_SITE_OTHER): Payer: Medicare Other | Admitting: Family Medicine

## 2020-10-01 ENCOUNTER — Encounter: Payer: Self-pay | Admitting: Family Medicine

## 2020-10-01 VITALS — BP 140/73 | HR 55 | Temp 97.2°F | Ht 71.0 in | Wt 226.4 lb

## 2020-10-01 DIAGNOSIS — I10 Essential (primary) hypertension: Secondary | ICD-10-CM

## 2020-10-01 DIAGNOSIS — R109 Unspecified abdominal pain: Secondary | ICD-10-CM

## 2020-10-01 DIAGNOSIS — R3914 Feeling of incomplete bladder emptying: Secondary | ICD-10-CM | POA: Diagnosis not present

## 2020-10-01 DIAGNOSIS — E785 Hyperlipidemia, unspecified: Secondary | ICD-10-CM

## 2020-10-01 DIAGNOSIS — N401 Enlarged prostate with lower urinary tract symptoms: Secondary | ICD-10-CM

## 2020-10-01 DIAGNOSIS — E1140 Type 2 diabetes mellitus with diabetic neuropathy, unspecified: Secondary | ICD-10-CM | POA: Diagnosis not present

## 2020-10-01 DIAGNOSIS — R1031 Right lower quadrant pain: Secondary | ICD-10-CM

## 2020-10-01 LAB — URINALYSIS
Bilirubin, UA: NEGATIVE
Glucose, UA: NEGATIVE
Ketones, UA: NEGATIVE
Leukocytes,UA: NEGATIVE
Nitrite, UA: NEGATIVE
Protein,UA: NEGATIVE
Specific Gravity, UA: 1.02 (ref 1.005–1.030)
Urobilinogen, Ur: 0.2 mg/dL (ref 0.2–1.0)
pH, UA: 7 (ref 5.0–7.5)

## 2020-10-01 LAB — LIPID PANEL
Chol/HDL Ratio: 2.4 ratio (ref 0.0–5.0)
Cholesterol, Total: 141 mg/dL (ref 100–199)
HDL: 59 mg/dL (ref >39)
LDL Chol Calc (NIH): 61 mg/dL (ref 0–99)
Triglycerides: 116 mg/dL (ref 0–149)
VLDL Cholesterol Cal: 21 mg/dL (ref 5–40)

## 2020-10-01 LAB — CBC WITH DIFFERENTIAL/PLATELET
Basophils Absolute: 0.1 10*3/uL (ref 0.0–0.2)
Basos: 1 %
EOS (ABSOLUTE): 0.2 10*3/uL (ref 0.0–0.4)
Eos: 3 %
Hematocrit: 48.1 % (ref 37.5–51.0)
Hemoglobin: 16.6 g/dL (ref 13.0–17.7)
Immature Grans (Abs): 0 10*3/uL (ref 0.0–0.1)
Immature Granulocytes: 1 %
Lymphocytes Absolute: 1.9 10*3/uL (ref 0.7–3.1)
Lymphs: 24 %
MCH: 31.4 pg (ref 26.6–33.0)
MCHC: 34.5 g/dL (ref 31.5–35.7)
MCV: 91 fL (ref 79–97)
Monocytes Absolute: 0.7 10*3/uL (ref 0.1–0.9)
Monocytes: 9 %
Neutrophils Absolute: 5.1 10*3/uL (ref 1.4–7.0)
Neutrophils: 62 %
Platelets: 234 10*3/uL (ref 150–450)
RBC: 5.28 x10E6/uL (ref 4.14–5.80)
RDW: 13 % (ref 11.6–15.4)
WBC: 8 10*3/uL (ref 3.4–10.8)

## 2020-10-01 LAB — CMP14+EGFR
ALT: 22 IU/L (ref 0–44)
AST: 21 IU/L (ref 0–40)
Albumin/Globulin Ratio: 1.6 (ref 1.2–2.2)
Albumin: 4.7 g/dL — ABNORMAL HIGH (ref 3.6–4.6)
Alkaline Phosphatase: 88 IU/L (ref 44–121)
BUN/Creatinine Ratio: 17 (ref 10–24)
BUN: 15 mg/dL (ref 8–27)
Bilirubin Total: 1.1 mg/dL (ref 0.0–1.2)
CO2: 26 mmol/L (ref 20–29)
Calcium: 9.4 mg/dL (ref 8.6–10.2)
Chloride: 98 mmol/L (ref 96–106)
Creatinine, Ser: 0.86 mg/dL (ref 0.76–1.27)
Globulin, Total: 2.9 g/dL (ref 1.5–4.5)
Glucose: 150 mg/dL — ABNORMAL HIGH (ref 65–99)
Potassium: 4.6 mmol/L (ref 3.5–5.2)
Sodium: 140 mmol/L (ref 134–144)
Total Protein: 7.6 g/dL (ref 6.0–8.5)
eGFR: 86 mL/min/{1.73_m2} (ref >59)

## 2020-10-01 LAB — BAYER DCA HB A1C WAIVED: HB A1C (BAYER DCA - WAIVED): 7.1 % — ABNORMAL HIGH (ref <7.0)

## 2020-10-01 MED ORDER — HYDROCODONE-ACETAMINOPHEN 5-325 MG PO TABS: 1.0000 | ORAL_TABLET | ORAL | 0 refills | Status: AC | PRN

## 2020-10-01 MED ORDER — KETOROLAC TROMETHAMINE 60 MG/2ML IM SOLN
60.0000 mg | Freq: Once | INTRAMUSCULAR | Status: AC
  Administered 2020-10-01: 60 mg via INTRAMUSCULAR

## 2020-10-01 MED ORDER — CIPROFLOXACIN HCL 500 MG PO TABS: 500.0000 mg | ORAL_TABLET | Freq: Two times a day (BID) | ORAL | 0 refills | Status: DC

## 2020-10-01 NOTE — Progress Notes (Signed)
Subjective:  Patient ID: [name],  male    DOB: [birth year]  Age: 85 y.o.    CC: Medical Management of Chronic Issues   HPI [name] presents for  follow-up of hypertension. Patient has no history of headache chest pain or shortness of breath or recent cough. Patient also denies symptoms of TIA such as numbness weakness lateralizing. Patient denies side effects from medication. States taking it regularly.  Patient also  in for follow-up of elevated cholesterol. Doing well without complaints on current medication. Denies side effects  including myalgia and arthralgia and nausea. Also in today for liver function testing. Currently no chest pain, shortness of breath or other cardiovascular related symptoms noted.  Follow-up of diabetes. Patient does  not check blood sugar at home.  Patient denies symptoms such as excessive hunger or urinary frequency, excessive hunger, nausea No significant hypoglycemic spells noted. Medications reviewed. Pt reports taking them regularly. Pt. denies complication/adverse reaction today.   Right testicle pain for 3 days. Radiating from right flank. Back pain for 2 weeks. Can't sleep due to pain for 2 weeks. Constant. Dull ache. Fels like he got hit in the testicle with a baseball.    History [name] has a past medical history of Arthritis, Atrial fibrillation ([place]), BPH (benign prostatic hyperplasia), Cataracts, bilateral, Coronary artery disease excluded, Diabetes mellitus without complication ([place]), Diverticulosis, Endocarditis, valve unspecified, unspecified cause, GERD (gastroesophageal reflux disease), H/O mitral valve repair, Hearing loss, Heart murmur, Hiatal hernia (06/13/2002), High cholesterol, History of kidney stones, OSA on CPAP, Stroke ([place]), Unspecified essential hypertension, Weakness of both arms (07/19/2012), and Wears dentures.   He has a past surgical history that includes Vasectomy; Mitral valve repair (~ 2003); Cataract  extraction w/PHACO (Right, 05/12/2015); Cataract extraction w/PHACO (Left, 06/12/2015); Total knee arthroplasty (Left, 02/20/2016); Injection knee (Right, 02/20/2016); Cardioversion (N/A, 04/14/2016); Laparoscopic cholecystectomy; Knee arthroscopy (Left); Joint replacement; Shoulder open rotator cuff repair (Left); Colonoscopy; Upper gi endoscopy; Multiple tooth extractions; Cardiac catheterization (02/14/2007); Shoulder arthroscopy with rotator cuff repair and open biceps tenodesis (Right, 01/26/2019); Eye surgery; and Total knee arthroplasty (Right, 07/06/2019).   His family history includes Congestive Heart Failure in his father; Diabetes Mellitus II in his brother and sister; Heart disease in his father; Lung cancer in his brother; Stroke in his brother, brother, sister, and sister.He reports that he quit smoking about 32 years ago. His smoking use included cigarettes. He has a 105.00 pack-year smoking history. He has never used smokeless tobacco. He reports that he does not drink alcohol and does not use drugs.  Current Outpatient Medications on File Prior to Visit  Medication Sig Dispense Refill   amLODipine (NORVASC) 5 MG tablet Take 1 tablet (5 mg total) by mouth daily. 90 tablet 1   apixaban (ELIQUIS) 5 MG TABS tablet Take 1 tablet (5 mg total) by mouth 2 (two) times daily. 180 tablet 1   Baclofen 5 MG TABS Take 5 mg by mouth 3 (three) times daily as needed. 90 tablet 0   Calcium Carb-Cholecalciferol (CALCIUM 600 + D PO) Take 1 tablet by mouth daily.     Coenzyme Q10 300 MG CAPS Take 300 mg by mouth daily.     diclofenac Sodium (VOLTAREN) 1 % GEL Apply 2 g topically 4 (four) times daily. 300 g 2   lisinopril (ZESTRIL) 20 MG tablet TAKE 1 TABLET BY MOUTH EVERYDAY AT BEDTIME 90 tablet 1   metFORMIN (GLUCOPHAGE-XR) 500 MG 24 hr tablet TAKE 1 TABLET BY MOUTH EVERY DAY  WITH BREAKFAST 90 tablet 1   Multiple Vitamin (MULTIVITAMIN WITH MINERALS) TABS tablet Take 1 tablet by mouth daily.     omeprazole  (PRILOSEC) 40 MG capsule Take 1 capsule (40 mg total) by mouth daily. 90 capsule 1   rosuvastatin (CRESTOR) 5 MG tablet Take 1 tablet (5 mg total) by mouth daily. For cholesterol 90 tablet 1   No current facility-administered medications on file prior to visit.    ROS Review of Systems  Constitutional:  Negative for fever.  Respiratory:  Negative for shortness of breath.   Cardiovascular:  Negative for chest pain.  Genitourinary:  Positive for flank pain, frequency and testicular pain.  Musculoskeletal:  Negative for arthralgias.  Skin:  Negative for rash.   Objective:  BP 140/73   Pulse (!) 55   Temp (!) 97.2 F (36.2 C)   Ht 5' 11" (1.803 m)   Wt 226 lb 6.4 oz (102.7 kg)   SpO2 97%   BMI 31.58 kg/m   BP Readings from Last 3 Encounters:  10/01/20 140/73  09/22/20 127/75  09/15/20 124/76    Wt Readings from Last 3 Encounters:  10/01/20 226 lb 6.4 oz (102.7 kg)  09/22/20 229 lb (103.9 kg)  09/15/20 235 lb (106.6 kg)     Physical Exam Constitutional:      General: He is not in acute distress.    Appearance: He is well-developed.  HENT:     Head: Normocephalic and atraumatic.     Right Ear: External ear normal.     Left Ear: External ear normal.     Nose: Nose normal.  Eyes:     Conjunctiva/sclera: Conjunctivae normal.     Pupils: Pupils are equal, round, and reactive to light.  Cardiovascular:     Rate and Rhythm: Normal rate and regular rhythm.     Heart sounds: Normal heart sounds. No murmur heard. Pulmonary:     Effort: Pulmonary effort is normal. No respiratory distress.     Breath sounds: Normal breath sounds. No wheezing or rales.  Abdominal:     Palpations: Abdomen is soft. There is no mass.     Tenderness: There is abdominal tenderness (right flank). There is no guarding or rebound.  Genitourinary:    Comments: Right testicle swollen, tender Musculoskeletal:        General: Normal range of motion.     Cervical back: Normal range of motion and neck  supple.  Skin:    General: Skin is warm and dry.  Neurological:     Mental Status: He is alert and oriented to person, place, and time.     Deep Tendon Reflexes: Reflexes are normal and symmetric.  Psychiatric:        Behavior: Behavior normal.        Thought Content: Thought content normal.        Judgment: Judgment normal.    Diabetic Foot Exam - Simple   No data filed       Assessment & Plan:   [name] was seen today for medical management of chronic issues.  Diagnoses and all orders for this visit:  Type 2 diabetes mellitus with diabetic neuropathy, unspecified whether long term insulin use ([place]) -     Bayer DCA Hb A1c Waived -     CBC with Differential/Platelet  Essential hypertension -     CMP14+EGFR  Dyslipidemia -     Lipid panel  Right lower quadrant abdominal pain -     CT RENAL  STONE STUDY; Future  Right flank pain -     Urinalysis -     Urine Culture -     ketorolac (TORADOL) injection 60 mg -     HYDROcodone-acetaminophen (NORCO) 5-325 MG tablet; Take 1 tablet by mouth every 4 (four) hours as needed for up to 5 days for moderate pain or severe pain. Take for moderate to severe pain -     ciprofloxacin (CIPRO) 500 MG tablet; Take 1 tablet (500 mg total) by mouth 2 (two) times daily.  Benign prostatic hyperplasia with incomplete bladder emptying -     ciprofloxacin (CIPRO) 500 MG tablet; Take 1 tablet (500 mg total) by mouth 2 (two) times daily.  I am having [name] start on HYDROcodone-acetaminophen and ciprofloxacin. I am also having him maintain his multivitamin with minerals, Calcium Carb-Cholecalciferol (CALCIUM 600 + D PO), Coenzyme Q10, rosuvastatin, omeprazole, lisinopril, metFORMIN, amLODipine, Eliquis, Baclofen, and diclofenac Sodium. We administered ketorolac.  Meds ordered this encounter  Medications   ketorolac (TORADOL) injection 60 mg   HYDROcodone-acetaminophen (NORCO) 5-325 MG tablet    Sig: Take 1 tablet by mouth every 4  (four) hours as needed for up to 5 days for moderate pain or severe pain. Take for moderate to severe pain    Dispense:  30 tablet    Refill:  0   ciprofloxacin (CIPRO) 500 MG tablet    Sig: Take 1 tablet (500 mg total) by mouth 2 (two) times daily.    Dispense:  20 tablet    Refill:  0     Follow-up: No follow-ups on file.  Claretta Fraise, M.D.

## 2020-10-02 ENCOUNTER — Ambulatory Visit
Admission: RE | Admit: 2020-10-02 | Discharge: 2020-10-02 | Disposition: A | Discharge status: Home / Self Care | Payer: Medicare Other | Source: Ambulatory Visit | Attending: Family Medicine | Admitting: Family Medicine

## 2020-10-02 ENCOUNTER — Telehealth: Payer: Self-pay | Admitting: Family Medicine

## 2020-10-02 ENCOUNTER — Ambulatory Visit
Admission: RE | Admit: 2020-10-02 | Discharge: 2020-10-02 | Disposition: A | Discharge status: Home / Self Care | Payer: Medicare Other | Source: Ambulatory Visit | Attending: Orthopaedic Surgery | Admitting: Orthopaedic Surgery

## 2020-10-02 DIAGNOSIS — M48061 Spinal stenosis, lumbar region without neurogenic claudication: Secondary | ICD-10-CM

## 2020-10-02 DIAGNOSIS — K429 Umbilical hernia without obstruction or gangrene: Secondary | ICD-10-CM | POA: Diagnosis not present

## 2020-10-02 DIAGNOSIS — M545 Low back pain, unspecified: Secondary | ICD-10-CM | POA: Diagnosis not present

## 2020-10-02 DIAGNOSIS — K769 Liver disease, unspecified: Secondary | ICD-10-CM | POA: Diagnosis not present

## 2020-10-02 DIAGNOSIS — R1031 Right lower quadrant pain: Secondary | ICD-10-CM

## 2020-10-02 DIAGNOSIS — N320 Bladder-neck obstruction: Secondary | ICD-10-CM | POA: Diagnosis not present

## 2020-10-03 ENCOUNTER — Other Ambulatory Visit: Payer: Self-pay | Admitting: Family Medicine

## 2020-10-03 DIAGNOSIS — R3914 Feeling of incomplete bladder emptying: Secondary | ICD-10-CM

## 2020-10-03 DIAGNOSIS — N401 Enlarged prostate with lower urinary tract symptoms: Secondary | ICD-10-CM

## 2020-10-03 LAB — URINE CULTURE

## 2020-10-03 MED ORDER — SILODOSIN 4 MG PO CAPS
4.0000 mg | ORAL_CAPSULE | Freq: Every day | ORAL | 2 refills | Status: DC
Start: 2020-10-03 — End: 2021-01-01

## 2020-10-06 ENCOUNTER — Encounter: Payer: Self-pay | Admitting: Family Medicine

## 2020-10-06 ENCOUNTER — Other Ambulatory Visit: Payer: Self-pay

## 2020-10-06 ENCOUNTER — Ambulatory Visit (INDEPENDENT_AMBULATORY_CARE_PROVIDER_SITE_OTHER): Payer: Medicare Other | Admitting: Family Medicine

## 2020-10-06 VITALS — BP 125/74 | HR 82 | Temp 97.5°F | Ht 71.0 in | Wt 233.6 lb

## 2020-10-06 DIAGNOSIS — R109 Unspecified abdominal pain: Secondary | ICD-10-CM | POA: Diagnosis not present

## 2020-10-06 DIAGNOSIS — N452 Orchitis: Secondary | ICD-10-CM

## 2020-10-06 LAB — URINALYSIS
Bilirubin, UA: NEGATIVE
Glucose, UA: NEGATIVE
Ketones, UA: NEGATIVE
Leukocytes,UA: NEGATIVE
Nitrite, UA: NEGATIVE
Protein,UA: NEGATIVE
Specific Gravity, UA: >1.03 — ABNORMAL HIGH (ref 1.005–1.030)
Urobilinogen, Ur: 0.2 mg/dL (ref 0.2–1.0)
pH, UA: 6.5 (ref 5.0–7.5)

## 2020-10-06 MED ORDER — TRAMADOL HCL 50 MG PO TABS: 50.0000 mg | ORAL_TABLET | Freq: Four times a day (QID) | ORAL | 0 refills | Status: AC

## 2020-10-06 NOTE — Progress Notes (Signed)
Subjective:  Patient ID: [name], male    DOB: [birth year]  Age: 85 y.o. MRN: [id number]  CC: Groin Pain   HPI [name] presents for recheck of his low back pain radiating to the groin and testicle.  He has been on Cipro for several days.  His CT urogram was reviewed showing that he had prostate enlargement.  As a result I called and Rapaflo for him.  He has not been able to pick that up yet.  He is also scheduled to see his urologist Dr. Tammi Klippel tomorrow.  The pain can be intolerable, unbearable in the right testicle particularly late at night.  He has to get up during the night and just takes because of the severity of his pain.  It gets to 10/10 at this time.  The back pain has moved downward and centrally to the paraspinous musculature of approximately L4-5.  He does feel some pain in the inguinal region still radiating towards the testicle.  Depression screen [facility] 2/9 10/06/2020 10/01/2020 10/01/2020  Decreased Interest 0 0 0  Down, Depressed, Hopeless 0 0 1  PHQ - 2 Score 0 0 1  Altered sleeping - 0 1  Tired, decreased energy - 0 1  Change in appetite - 0 1  Feeling bad or failure about yourself  - 0 0  Trouble concentrating - 0 1  Moving slowly or fidgety/restless - 0 1  Suicidal thoughts - 0 0  PHQ-9 Score - 0 6  Difficult doing work/chores - Not difficult at all Not difficult at all  Some recent data might be hidden    History [name] has a past medical history of Arthritis, Atrial fibrillation ([place]), BPH (benign prostatic hyperplasia), Cataracts, bilateral, Coronary artery disease excluded, Diabetes mellitus without complication ([place]), Diverticulosis, Endocarditis, valve unspecified, unspecified cause, GERD (gastroesophageal reflux disease), H/O mitral valve repair, Hearing loss, Heart murmur, Hiatal hernia (06/13/2002), High cholesterol, History of kidney stones, OSA on CPAP, Stroke ([place]), Unspecified essential hypertension, Weakness of both arms (07/19/2012), and  Wears dentures.   He has a past surgical history that includes Vasectomy; Mitral valve repair (~ 2003); Cataract extraction w/PHACO (Right, 05/12/2015); Cataract extraction w/PHACO (Left, 06/12/2015); Total knee arthroplasty (Left, 02/20/2016); Injection knee (Right, 02/20/2016); Cardioversion (N/A, 04/14/2016); Laparoscopic cholecystectomy; Knee arthroscopy (Left); Joint replacement; Shoulder open rotator cuff repair (Left); Colonoscopy; Upper gi endoscopy; Multiple tooth extractions; Cardiac catheterization (02/14/2007); Shoulder arthroscopy with rotator cuff repair and open biceps tenodesis (Right, 01/26/2019); Eye surgery; and Total knee arthroplasty (Right, 07/06/2019).   His family history includes Congestive Heart Failure in his father; Diabetes Mellitus II in his brother and sister; Heart disease in his father; Lung cancer in his brother; Stroke in his brother, brother, sister, and sister.He reports that he quit smoking about 32 years ago. His smoking use included cigarettes. He has a 105.00 pack-year smoking history. He has never used smokeless tobacco. He reports that he does not drink alcohol and does not use drugs.    ROS Review of Systems  Constitutional: Negative.  Negative for fever.  HENT: Negative.    Gastrointestinal:  Positive for abdominal pain (right inguinal). Negative for abdominal distention.  Genitourinary:  Positive for frequency and testicular pain. Negative for decreased urine volume, difficulty urinating, penile pain and urgency.   Objective:  BP 125/74   Pulse 82   Temp (!) 97.5 F (36.4 C)   Ht '5\' 11"'$  (1.803 m)   Wt 233 lb 9.6 oz (106 kg)   SpO2 94%  BMI 32.58 kg/m   BP Readings from Last 3 Encounters:  10/06/20 125/74  10/01/20 140/73  09/22/20 127/75    Wt Readings from Last 3 Encounters:  10/06/20 233 lb 9.6 oz (106 kg)  10/01/20 226 lb 6.4 oz (102.7 kg)  09/22/20 229 lb (103.9 kg)     Physical Exam Vitals reviewed.  Constitutional:       Appearance: He is well-developed.  HENT:     Head: Normocephalic and atraumatic.     Right Ear: External ear normal.     Left Ear: External ear normal.     Mouth/Throat:     Pharynx: No oropharyngeal exudate or posterior oropharyngeal erythema.  Eyes:     Pupils: Pupils are equal, round, and reactive to light.  Cardiovascular:     Rate and Rhythm: Normal rate and regular rhythm.     Heart sounds: No murmur heard. Pulmonary:     Effort: No respiratory distress.     Breath sounds: Normal breath sounds.  Genitourinary:    Testes:        Right: Tenderness and swelling (less than last exam) present. Right testis is descended.        Left: Tenderness not present.  Musculoskeletal:     Cervical back: Normal range of motion and neck supple.  Neurological:     Mental Status: He is alert and oriented to person, place, and time.      Assessment & Plan:   [name] was seen today for groin pain.  Diagnoses and all orders for this visit:  Orchitis -     Urinalysis -     Urine Culture  Right flank pain  Other orders -     traMADol (ULTRAM) 50 MG tablet; Take 1 tablet (50 mg total) by mouth 4 (four) times daily for 5 days. 1-2 tablets up to 4 times a day as needed for pain      I am having [name] start on traMADol. I am also having him maintain his multivitamin with minerals, Calcium Carb-Cholecalciferol (CALCIUM 600 + D PO), Coenzyme Q10, rosuvastatin, omeprazole, lisinopril, metFORMIN, amLODipine, Eliquis, Baclofen, diclofenac Sodium, HYDROcodone-acetaminophen, ciprofloxacin, and silodosin.  Allergies as of 10/06/2020       Reactions   Flomax [tamsulosin Hcl] Other (See Comments)   Makes him "swimmy headed"        Medication List        Accurate as of October 06, 2020  5:46 PM. If you have any questions, ask your nurse or doctor.          amLODipine 5 MG tablet Commonly known as: NORVASC Take 1 tablet (5 mg total) by mouth daily.   Baclofen 5 MG  Tabs Take 5 mg by mouth 3 (three) times daily as needed.   CALCIUM 600 + D PO Take 1 tablet by mouth daily.   ciprofloxacin 500 MG tablet Commonly known as: Cipro Take 1 tablet (500 mg total) by mouth 2 (two) times daily.   Coenzyme Q10 300 MG Caps Take 300 mg by mouth daily.   diclofenac Sodium 1 % Gel Commonly known as: Voltaren Apply 2 g topically 4 (four) times daily.   Eliquis 5 MG Tabs tablet Generic drug: apixaban Take 1 tablet (5 mg total) by mouth 2 (two) times daily.   HYDROcodone-acetaminophen 5-325 MG tablet Commonly known as: Norco Take 1 tablet by mouth every 4 (four) hours as needed for up to 5 days for moderate pain or severe pain. Take for  moderate to severe pain   lisinopril 20 MG tablet Commonly known as: ZESTRIL TAKE 1 TABLET BY MOUTH EVERYDAY AT BEDTIME   metFORMIN 500 MG 24 hr tablet Commonly known as: GLUCOPHAGE-XR TAKE 1 TABLET BY MOUTH EVERY DAY WITH BREAKFAST   multivitamin with minerals Tabs tablet Take 1 tablet by mouth daily.   omeprazole 40 MG capsule Commonly known as: PRILOSEC Take 1 capsule (40 mg total) by mouth daily.   rosuvastatin 5 MG tablet Commonly known as: CRESTOR Take 1 tablet (5 mg total) by mouth daily. For cholesterol   silodosin 4 MG Caps capsule Commonly known as: RAPAFLO Take 1 capsule (4 mg total) by mouth daily with breakfast.   traMADol 50 MG tablet Commonly known as: ULTRAM Take 1 tablet (50 mg total) by mouth 4 (four) times daily for 5 days. 1-2 tablets up to 4 times a day as needed for pain Started by: Claretta Fraise, MD         Follow-up: After release from Urology. That has been arranged for tomorrow. Continue Cipro. Fill the rapaflo. May use tramadol for milder pain and once the hydrocodone is gone. Do not mix the two  Claretta Fraise, M.D.

## 2020-10-07 ENCOUNTER — Other Ambulatory Visit: Payer: Self-pay | Admitting: Family

## 2020-10-07 DIAGNOSIS — N452 Orchitis: Secondary | ICD-10-CM | POA: Diagnosis not present

## 2020-10-07 DIAGNOSIS — R310 Gross hematuria: Secondary | ICD-10-CM | POA: Diagnosis not present

## 2020-10-07 DIAGNOSIS — N281 Cyst of kidney, acquired: Secondary | ICD-10-CM | POA: Diagnosis not present

## 2020-10-07 DIAGNOSIS — M545 Low back pain, unspecified: Secondary | ICD-10-CM

## 2020-10-08 LAB — URINE CULTURE: Organism ID, Bacteria: NO GROWTH

## 2020-10-09 ENCOUNTER — Ambulatory Visit (INDEPENDENT_AMBULATORY_CARE_PROVIDER_SITE_OTHER): Payer: Medicare Other | Admitting: Orthopaedic Surgery

## 2020-10-09 ENCOUNTER — Other Ambulatory Visit: Payer: Self-pay

## 2020-10-09 DIAGNOSIS — M545 Low back pain, unspecified: Secondary | ICD-10-CM | POA: Diagnosis not present

## 2020-10-13 DIAGNOSIS — M545 Low back pain, unspecified: Secondary | ICD-10-CM | POA: Insufficient documentation

## 2020-10-13 NOTE — Progress Notes (Signed)
Office Visit Note   Patient: [name]           Date of Birth: May 22, 1935           MRN: [id number] Visit Date: 10/09/2020              Requested by: Claretta Fraise, MD [place],  [place] [address] PCP: Claretta Fraise, MD   Assessment & Plan: Visit Diagnoses:  1. Right-sided low back pain without sciatica, unspecified chronicity     Plan: Patient will follow-up if he develops progressive radicular symptoms.  We reviewed MRI scan images again with copy of the report no areas of compression.  He does have some lumbar spondylosis at multiple levels.  Follow-Up Instructions: No follow-ups on file.   Orders:  No orders of the defined types were placed in this encounter.  No orders of the defined types were placed in this encounter.     Procedures: No procedures performed   Clinical Data: No additional findings.   Subjective: Chief Complaint  Patient presents with   Lower Back - Pain, Follow-up    MRI lumbar review    HPI 85 year old male returns with ongoing problems with low back pain.  Patient states he has had pain that been present in the testicle with swelling of the right testicle.  He has been on antibiotics also some medication for prostate.  He has been seen by urology and states symptoms have improved.  He is not sure if he had epididymitis or some other problem.  He rated his pain as 10 out of 10 but states is actually gotten quite a bit better.  Patient had renal CT scan which showed some enlargement of the prostate with findings consistent with chronic bladder outlet obstruction.  No renal stones were noted.  Review of Systems previous vasectomy mitral valve replacement cardioversion with A. fib joint replacement shoulder surgery.  He is on chronic Eliquis.  All the systems noncontributory HPI.   Objective: Vital Signs: Ht '5\' 11"'$  (1.803 m)   Wt 233 lb (105.7 kg)   BMI 32.50 kg/m   Physical Exam Constitutional:      Appearance: He is  well-developed.  HENT:     Head: Normocephalic and atraumatic.     Right Ear: External ear normal.     Left Ear: External ear normal.  Eyes:     Pupils: Pupils are equal, round, and reactive to light.  Neck:     Thyroid: No thyromegaly.     Trachea: No tracheal deviation.  Cardiovascular:     Rate and Rhythm: Normal rate.  Pulmonary:     Effort: Pulmonary effort is normal.     Breath sounds: No wheezing.  Abdominal:     General: Bowel sounds are normal.     Palpations: Abdomen is soft.  Musculoskeletal:     Cervical back: Neck supple.  Skin:    General: Skin is warm and dry.     Capillary Refill: Capillary refill takes less than 2 seconds.  Neurological:     Mental Status: He is alert and oriented to person, place, and time.  Psychiatric:        Behavior: Behavior normal.        Thought Content: Thought content normal.        Judgment: Judgment normal.    Ortho Exam knee and ankle jerk are intact.  Negative straight leg raising 90 degrees.  Anterior tib EHL is intact.  Slight decrease  sensation dorsum of the right foot.  EHL anterior tib test drawn today.  Specialty Comments:  No specialty comments available.  Imaging: CLINICAL DATA:  Low back pain radiating to the right side for 3 weeks.   EXAM: MRI LUMBAR SPINE WITHOUT CONTRAST   TECHNIQUE: Multiplanar, multisequence MR imaging of the lumbar spine was performed. No intravenous contrast was administered.   COMPARISON:  None.   FINDINGS: Segmentation:  Standard.   Alignment: Minimal grade 1 anterolisthesis of L1 on L2, L2 on L3 and L3 on L4 secondary to facet disease. Loss of the normal lumbar lordosis with straightening.   Vertebrae: No acute fracture, evidence of discitis, or aggressive bone lesion.   Conus medullaris and cauda equina: Conus extends to the L1 level. Conus and cauda equina appear normal.   Paraspinal and other soft tissues: No acute paraspinal abnormality. Multiple bilateral renal cysts  with the largest in the left kidney measures 4.9 cm.   Disc levels:   Disc spaces: Degenerative disease with disc height loss at L1-2, L2-3, L3-4 and L4-5. Acquired fusion across the L5-S1 disc space.   T12-L1: No significant disc bulge. No neural foraminal stenosis. No central canal stenosis.   L1-L2: Mild broad-based disc bulge with a right foraminal disc protrusion contacting the right L1 nerve root. Mild left facet arthropathy with a left facet effusion. Mild bilateral foraminal stenosis.   L2-L3: Broad-based disc bulge. Mild bilateral facet arthropathy. Mild spinal stenosis. No foraminal stenosis.   L3-L4: Broad-based disc bulge. Mild bilateral facet arthropathy. No foraminal stenosis. Mild spinal stenosis.   L4-L5: Mild broad-based disc bulge. Mild bilateral facet arthropathy. No foraminal or central canal stenosis.   L5-S1: Interbody fusion. Mild bilateral facet arthropathy. No significant foraminal narrowing. No central canal stenosis.   IMPRESSION: 1. Lumbar spine spondylosis as described above. 2. No acute osseous injury of the lumbar spine.     Electronically Signed   By: Kathreen Devoid M.D.   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   Diplopia 10/24/2017   S/P total knee arthroplasty, left 04/29/2016   Weakness of both arms 07/19/2012   Colon cancer screening 06/22/2012   Unspecified  venous (peripheral) insufficiency 05/05/2012   Chronic venous insufficiency 04/07/2012   Other malaise and fatigue 03/16/2012   Disturbance of skin sensation 03/16/2012   Carotid stenosis 08/16/2011   Obesity 08/16/2011   H/O mitral valve repair    Dyslipidemia 09/23/2010   Essential hypertension 04/06/2007   Sleep apnea 04/06/2007   Past Medical History:  Diagnosis Date   Arthritis    knees, shoulder,  hips (10/24/2017)   Atrial fibrillation (HCC)    on eliquis   BPH (benign prostatic hyperplasia)    Cataracts, bilateral    removed by surgery   Coronary artery disease excluded    Diabetes mellitus without complication ([place])    type 2   Diverticulosis    Endocarditis, valve unspecified, unspecified cause  GERD (gastroesophageal reflux disease)    H/O mitral valve repair    Postoperative ring with postoperative atrial fibrillation   Hearing loss    both ears - does not use hearing aids   Heart murmur    hx   Hiatal hernia 06/13/2002   High cholesterol    History of kidney stones    passed spontaneously x2    OSA on CPAP    uses cpap nightly   Stroke [facility])    Unspecified essential hypertension    Weakness of both arms 07/19/2012   Wears dentures    upper and lower partial    Family History  Problem Relation Age of Onset   Congestive Heart Failure Father    Heart disease Father    Stroke Brother    Stroke Sister    Diabetes Mellitus II Brother    Diabetes Mellitus II Sister    Lung cancer Brother    Stroke Sister    Stroke Brother    Colon cancer Neg Hx    Esophageal cancer Neg Hx    Rectal cancer Neg Hx    Stomach cancer Neg Hx     Past Surgical History:  Procedure Laterality Date   CARDIAC CATHETERIZATION  02/14/2007   x 2   CARDIOVERSION N/A 04/14/2016   Procedure: CARDIOVERSION;  Surgeon: Minus Breeding, MD;  Location: [facility];  Service: Cardiovascular;  Laterality: N/A;   CATARACT EXTRACTION W/PHACO Right 05/12/2015   Procedure: CATARACT  EXTRACTION PHACO AND INTRAOCULAR LENS PLACEMENT RIGHT EYE CDE=7.75;  Surgeon: Tonny Branch, MD;  Location: [facility];  Service: Ophthalmology;  Laterality: Right;   CATARACT EXTRACTION W/PHACO Left 06/12/2015   Procedure: CATARACT EXTRACTION PHACO AND INTRAOCULAR LENS PLACEMENT (IOC);  Surgeon: Tonny Branch, MD;  Location: [facility];  Service: Ophthalmology;  Laterality: Left;  CDE:  13.17   COLONOSCOPY     EYE SURGERY     Bilateral cataracts   INJECTION KNEE Right 02/20/2016   Procedure: KNEE INJECTION;  Surgeon: [name], MD;  Location: [facility];  Service: Orthopedics;  Laterality: Right;   JOINT REPLACEMENT     KNEE ARTHROSCOPY Left    LAPAROSCOPIC CHOLECYSTECTOMY     MITRAL VALVE REPAIR  ~ 2003   MULTIPLE TOOTH EXTRACTIONS     SHOULDER ARTHROSCOPY WITH ROTATOR CUFF REPAIR AND OPEN BICEPS TENODESIS Right 01/26/2019   Procedure: right shoulder arthroscopy, rotator cuff repair and biceps tenodesis;  Surgeon: [name], MD;  Location: [facility];  Service: Orthopedics;  Laterality: Right;   SHOULDER OPEN ROTATOR CUFF REPAIR Left    TOTAL KNEE ARTHROPLASTY Left 02/20/2016   Procedure: LEFT TOTAL KNEE ARTHROPLASTY WITH RIGHT KNEE INJECTION;  Surgeon: [name], MD;  Location: [facility];  Service: Orthopedics;  Laterality: Left;   TOTAL KNEE ARTHROPLASTY Right 07/06/2019   Procedure: RIGHT TOTAL KNEE ARTHROPLASTY;  Surgeon: [name], MD;  Location: [facility];  Service: Orthopedics;  Laterality: Right;   UPPER GI ENDOSCOPY     VASECTOMY     Social History   Occupational History   Occupation:  Environmental manager    Comment: RETIRED  Tobacco Use   Smoking status: Former    Packs/day: 3.00    Years: 35.00    Pack years: 105.00    Types: Cigarettes    Quit date: 02/16/1988    Years since quitting: 32.6   Smokeless tobacco: Never   Tobacco comments:     Year Quit: 1990   Vaping Use  Vaping Use: Never used  Substance and Sexual Activity   Alcohol use: No   Drug use: Never   Sexual activity: Not  Currently

## 2020-10-18 ENCOUNTER — Other Ambulatory Visit: Payer: Self-pay | Admitting: Family Medicine

## 2020-10-18 DIAGNOSIS — M545 Low back pain, unspecified: Secondary | ICD-10-CM

## 2020-10-21 ENCOUNTER — Ambulatory Visit (HOSPITAL_COMMUNITY): Payer: Medicare Other | Attending: Internal Medicine

## 2020-10-21 ENCOUNTER — Other Ambulatory Visit: Payer: Self-pay

## 2020-10-21 DIAGNOSIS — I4891 Unspecified atrial fibrillation: Secondary | ICD-10-CM | POA: Insufficient documentation

## 2020-10-21 DIAGNOSIS — R011 Cardiac murmur, unspecified: Secondary | ICD-10-CM | POA: Diagnosis not present

## 2020-10-21 DIAGNOSIS — E119 Type 2 diabetes mellitus without complications: Secondary | ICD-10-CM | POA: Insufficient documentation

## 2020-10-21 DIAGNOSIS — Z9889 Other specified postprocedural states: Secondary | ICD-10-CM

## 2020-10-21 DIAGNOSIS — Z954 Presence of other heart-valve replacement: Secondary | ICD-10-CM | POA: Diagnosis not present

## 2020-10-21 DIAGNOSIS — G4733 Obstructive sleep apnea (adult) (pediatric): Secondary | ICD-10-CM | POA: Diagnosis not present

## 2020-10-21 DIAGNOSIS — I10 Essential (primary) hypertension: Secondary | ICD-10-CM | POA: Insufficient documentation

## 2020-10-21 LAB — ECHOCARDIOGRAM COMPLETE
Area-P 1/2: 1.15 cm2
MV VTI: 0.97 cm2
S' Lateral: 3.1 cm

## 2020-11-13 DIAGNOSIS — X32XXXD Exposure to sunlight, subsequent encounter: Secondary | ICD-10-CM | POA: Diagnosis not present

## 2020-11-13 DIAGNOSIS — L570 Actinic keratosis: Secondary | ICD-10-CM | POA: Diagnosis not present

## 2020-11-13 DIAGNOSIS — Z1283 Encounter for screening for malignant neoplasm of skin: Secondary | ICD-10-CM | POA: Diagnosis not present

## 2020-11-13 DIAGNOSIS — Z85828 Personal history of other malignant neoplasm of skin: Secondary | ICD-10-CM | POA: Diagnosis not present

## 2020-11-13 DIAGNOSIS — L820 Inflamed seborrheic keratosis: Secondary | ICD-10-CM | POA: Diagnosis not present

## 2020-11-13 DIAGNOSIS — Z08 Encounter for follow-up examination after completed treatment for malignant neoplasm: Secondary | ICD-10-CM | POA: Diagnosis not present

## 2020-11-13 DIAGNOSIS — D225 Melanocytic nevi of trunk: Secondary | ICD-10-CM | POA: Diagnosis not present

## 2020-11-20 DIAGNOSIS — G4733 Obstructive sleep apnea (adult) (pediatric): Secondary | ICD-10-CM | POA: Diagnosis not present

## 2020-11-30 NOTE — Progress Notes (Signed)
CC:    Mitral Valve Repair.   HPI  The patient presents for evaluation of mitral valve repair.  Echo 10/2015 demonstrated stable MV repair.  While in the hospital in 2017 for knee surgery he developed persistent atrial fib.  He had cardioversion but went back into atrial fib.  He wore a Holter monitor which demonstrated frequent 3.5 second pauses at night. His average heart rate was about 80.   He is treated for sleep apnea.  In 2019 he had some double vision and was told he could have had a "small stroke."  His INR was therapeutic. Follow up echo was normal.  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  He was also seen by neurology.    He return for follow up.  He has done well.  He goes to the gym couple times a week.  He did have some dizziness couple of months ago was diagnosed in urgent care is having vertigo.  He has had no presyncope or syncope.  He will get some crossed vision occasionally but this resolves quickly.  He does not feel his irregular heartbeat.  He denies any chest pressure, neck or arm discomfort.  He has had no weight gain.  He has chronic mild lower extremity edema at the end of the day.   Allergies  Allergen Reactions   Flomax [Tamsulosin Hcl] Other (See Comments)    Makes him "swimmy headed"    Current Outpatient Medications  Medication Sig Dispense Refill   amLODipine (NORVASC) 5 MG tablet Take 1 tablet (5 mg total) by mouth daily. 90 tablet 1   apixaban (ELIQUIS) 5 MG TABS tablet Take 1 tablet (5 mg total) by mouth 2 (two) times daily. 180 tablet 1   Calcium Carb-Cholecalciferol (CALCIUM 600 + D PO) Take 1 tablet by mouth daily.     Coenzyme Q10 300 MG CAPS Take 300 mg by mouth daily.     lisinopril (ZESTRIL) 20 MG tablet TAKE 1 TABLET BY MOUTH EVERYDAY AT BEDTIME 90 tablet 1   metFORMIN (GLUCOPHAGE-XR) 500 MG 24 hr tablet TAKE 1 TABLET BY MOUTH EVERY DAY WITH BREAKFAST 90 tablet 1   Multiple Vitamin  (MULTIVITAMIN WITH MINERALS) TABS tablet Take 1 tablet by mouth daily.     omeprazole (PRILOSEC) 40 MG capsule Take 1 capsule (40 mg total) by mouth daily. 90 capsule 1   rosuvastatin (CRESTOR) 5 MG tablet Take 1 tablet (5 mg total) by mouth daily. For cholesterol 90 tablet 1   silodosin (RAPAFLO) 4 MG CAPS capsule Take 1 capsule (4 mg total) by mouth daily with breakfast. 60 capsule 2   Baclofen 5 MG TABS TAKE 1 TABLET (5 MG) BY MOUTH 3 (THREE) TIMES DAILY AS NEEDED. (Patient not taking: Reported on 12/01/2020) 270 tablet 1   ciprofloxacin (CIPRO) 500 MG tablet Take 1 tablet (500 mg total) by mouth 2 (two) times daily. (Patient not taking: Reported on 12/01/2020) 20 tablet 0   diclofenac Sodium (VOLTAREN) 1 % GEL Apply 2 g topically 4 (four) times daily. (Patient not taking: Reported on 12/01/2020) 300 g 2   meclizine (ANTIVERT) 25 MG tablet Take 25 mg by mouth 3 (three) times daily as needed.     No current facility-administered medications for this visit.    Past Medical History:  Diagnosis Date   Arthritis    knees, shoulder,  hips (10/24/2017)   Atrial fibrillation (HCC)    on eliquis   BPH (benign  prostatic hyperplasia)    Cataracts, bilateral    removed by surgery   Coronary artery disease excluded    Diabetes mellitus without complication ([place])    type 2   Diverticulosis    Endocarditis, valve unspecified, unspecified cause    GERD (gastroesophageal reflux disease)    H/O mitral valve repair    Postoperative ring with postoperative atrial fibrillation   Hearing loss    both ears - does not use hearing aids   Heart murmur    hx   Hiatal hernia 06/13/2002   High cholesterol    History of kidney stones    passed spontaneously x2    OSA on CPAP    uses cpap nightly   Stroke [facility])    Unspecified essential hypertension    Weakness of both arms 07/19/2012   Wears dentures    upper and lower partial    Past Surgical History:  Procedure Laterality Date   CARDIAC  CATHETERIZATION  02/14/2007   x 2   CARDIOVERSION N/A 04/14/2016   Procedure: CARDIOVERSION;  Surgeon: Minus Breeding, MD;  Location: [place];  Service: Cardiovascular;  Laterality: N/A;   CATARACT EXTRACTION W/PHACO Right 05/12/2015   Procedure: CATARACT EXTRACTION PHACO AND INTRAOCULAR LENS PLACEMENT RIGHT EYE CDE=7.75;  Surgeon: Tonny Branch, MD;  [facility];  Service: Ophthalmology;  Laterality: Right;   CATARACT EXTRACTION W/PHACO Left 06/12/2015   Procedure: CATARACT EXTRACTION PHACO AND INTRAOCULAR LENS PLACEMENT (IOC);  Surgeon: Tonny Branch, MD;  [facility];  Service: Ophthalmology;  Laterality: Left;  CDE:  13.17   COLONOSCOPY     EYE SURGERY     Bilateral cataracts   INJECTION KNEE Right 02/20/2016   Procedure: KNEE INJECTION;  Surgeon: [name], MD;  Location: [place];  Service: Orthopedics;  Laterality: Right;   JOINT REPLACEMENT     KNEE ARTHROSCOPY Left    LAPAROSCOPIC CHOLECYSTECTOMY     MITRAL VALVE REPAIR  ~ 2003   MULTIPLE TOOTH EXTRACTIONS     SHOULDER ARTHROSCOPY WITH ROTATOR CUFF REPAIR AND OPEN BICEPS TENODESIS Right 01/26/2019   Procedure: right shoulder arthroscopy, rotator cuff repair and biceps tenodesis;  Surgeon: [name], MD;  Location: [place];  Service: Orthopedics;  Laterality: Right;   SHOULDER OPEN ROTATOR CUFF REPAIR Left    TOTAL KNEE ARTHROPLASTY Left 02/20/2016   Procedure: LEFT TOTAL KNEE ARTHROPLASTY WITH RIGHT KNEE INJECTION;  Surgeon: [name], MD;  Location: [facility];  Service: Orthopedics;  Laterality: Left;   TOTAL KNEE ARTHROPLASTY Right 07/06/2019   Procedure: RIGHT TOTAL KNEE ARTHROPLASTY;  Surgeon: [name], MD;  Location: [facility];  Service: Orthopedics;  Laterality: Right;   UPPER GI ENDOSCOPY     VASECTOMY      ROS: Positive for food getting stuck occasionally, back pain.Marland Kitchen    Marland KitchenOtherwise as stated in the HPI and negative for all other systems.  PHYSICAL EXAM BP 108/60   Pulse 61   Ht 5\' 11"  (1.803 m)   Wt 234 lb 12.8  oz (106.5 kg)   SpO2 99%   BMI 32.75 kg/m   GENERAL:  Well appearing NECK:  No jugular venous distention, waveform within normal limits, carotid upstroke brisk and symmetric, no bruits, no thyromegaly LUNGS:  Clear to auscultation bilaterally CHEST:  Unremarkable HEART:  PMI not displaced or sustained,S1 and S2 within normal limits, no S3,  no clicks, no rubs, no murmurs, irregular  ABD:  Flat, positive bowel sounds normal in frequency in  pitch, no bruits, no rebound, no guarding, no midline pulsatile mass, no hepatomegaly, no splenomegaly EXT:  2 plus pulses throughout, no edema, no cyanosis no clubbing   EKG: ATRIAL fibrillation, rate 61, axis within normal limits, intervals within normal limits, low voltage in the limb leads, no acute ST-T wave changes.    ASSESSMENT AND PLAN  ATRIAL FIB:    [name] has a CHA2DS2 - VASc of 5.   No change in therapy.  He tolerates anticoagulation and is up-to-date with labs.  He is on appropriate dose.  SLEEP APNEA:    He wears CPAP.    MITRAL VALVE REPAIR:    He had stable repair in September.  No further imaging.  HTN:  The blood pressure is at target.  No change in therapy.  OBESITY:    We discussed a low carbohydrate diet.

## 2020-12-01 ENCOUNTER — Encounter: Payer: Self-pay | Admitting: Cardiology

## 2020-12-01 ENCOUNTER — Ambulatory Visit: Payer: Medicare Other | Admitting: Cardiology

## 2020-12-01 ENCOUNTER — Other Ambulatory Visit: Payer: Self-pay

## 2020-12-01 VITALS — BP 108/60 | HR 61 | Ht 71.0 in | Wt 234.8 lb

## 2020-12-01 DIAGNOSIS — I10 Essential (primary) hypertension: Secondary | ICD-10-CM | POA: Diagnosis not present

## 2020-12-01 DIAGNOSIS — I482 Chronic atrial fibrillation, unspecified: Secondary | ICD-10-CM

## 2020-12-01 DIAGNOSIS — I340 Nonrheumatic mitral (valve) insufficiency: Secondary | ICD-10-CM | POA: Diagnosis not present

## 2020-12-01 NOTE — Patient Instructions (Signed)

## 2020-12-24 ENCOUNTER — Other Ambulatory Visit: Payer: Self-pay | Admitting: Family Medicine

## 2021-01-01 ENCOUNTER — Ambulatory Visit (INDEPENDENT_AMBULATORY_CARE_PROVIDER_SITE_OTHER): Payer: Medicare Other | Admitting: Family Medicine

## 2021-01-01 ENCOUNTER — Encounter: Payer: Self-pay | Admitting: Family Medicine

## 2021-01-01 ENCOUNTER — Other Ambulatory Visit: Payer: Self-pay

## 2021-01-01 VITALS — BP 131/77 | HR 68 | Temp 97.3°F | Resp 20 | Ht 71.0 in | Wt 235.0 lb

## 2021-01-01 DIAGNOSIS — E119 Type 2 diabetes mellitus without complications: Secondary | ICD-10-CM

## 2021-01-01 DIAGNOSIS — E1140 Type 2 diabetes mellitus with diabetic neuropathy, unspecified: Secondary | ICD-10-CM | POA: Diagnosis not present

## 2021-01-01 DIAGNOSIS — E785 Hyperlipidemia, unspecified: Secondary | ICD-10-CM

## 2021-01-01 DIAGNOSIS — I10 Essential (primary) hypertension: Secondary | ICD-10-CM

## 2021-01-01 LAB — BAYER DCA HB A1C WAIVED: HB A1C (BAYER DCA - WAIVED): 6.3 % — ABNORMAL HIGH (ref 4.8–5.6)

## 2021-01-01 MED ORDER — APIXABAN 5 MG PO TABS: 5.0000 mg | ORAL_TABLET | Freq: Two times a day (BID) | ORAL | 1 refills | Status: DC

## 2021-01-01 MED ORDER — ROSUVASTATIN CALCIUM 5 MG PO TABS: 5.0000 mg | ORAL_TABLET | Freq: Every day | ORAL | 1 refills | Status: DC

## 2021-01-01 MED ORDER — METFORMIN HCL ER 500 MG PO TB24: ORAL_TABLET | ORAL | 1 refills | Status: DC

## 2021-01-01 MED ORDER — OMEPRAZOLE 40 MG PO CPDR: 40.0000 mg | DELAYED_RELEASE_CAPSULE | Freq: Every day | ORAL | 1 refills | Status: DC

## 2021-01-01 MED ORDER — SILODOSIN 4 MG PO CAPS: 4.0000 mg | ORAL_CAPSULE | Freq: Every day | ORAL | 3 refills | Status: DC

## 2021-01-01 MED ORDER — AMLODIPINE BESYLATE 5 MG PO TABS: 5.0000 mg | ORAL_TABLET | Freq: Every day | ORAL | 1 refills | Status: DC

## 2021-01-01 NOTE — Progress Notes (Signed)
Subjective:  Patient ID: [name],  male    DOB: 08-Dec-1935  Age: 85 y.o.    CC: Medical Management of Chronic Issues   HPI [name] presents for  follow-up of hypertension. Patient has no history of headache chest pain or shortness of breath or recent cough. Patient also denies symptoms of TIA such as numbness weakness lateralizing. Patient denies side effects from medication. States taking it regularly.  Patient also  in for follow-up of elevated cholesterol. Doing well without complaints on current medication. Denies side effects  including myalgia and arthralgia and nausea. Also in today for liver function testing. Currently no chest pain, shortness of breath or other cardiovascular related symptoms noted.  Follow-up of diabetes. Patient does not check blood sugar at home. Patient denies symptoms such as excessive hunger or urinary frequency, excessive hunger, nausea No significant hypoglycemic spells noted. Medications reviewed. Pt reports taking them regularly. Pt. denies complication/adverse reaction today.   Atrial fibrillation follow up. Pt. is treated with rate control and anticoagulation. Pt.  denies palpitations, rapid rate, chest pain, dyspnea and edema. There has been no bleeding from nose or gums. Pt. has not noticed blood with urine or stool.  Although there is routine bruising easily, it is not excessive.  Lightheadedness upon arising. Onset 1-2 mos. Better if he gets up slowly. Rare during the day.      History [name] has a past medical history of Arthritis, Atrial fibrillation (HCC), BPH (benign prostatic hyperplasia), Cataracts, bilateral, Coronary artery disease excluded, Diabetes mellitus without complication (HCC), Diverticulosis, Endocarditis, valve unspecified, unspecified cause, GERD (gastroesophageal reflux disease), H/O mitral valve repair, Hearing loss, Heart murmur, Hiatal hernia (06/13/2002), High cholesterol, History of kidney stones, OSA on  CPAP, Stroke (HCC), Unspecified essential hypertension, Weakness of both arms (07/19/2012), and Wears dentures.   He has a past surgical history that includes Vasectomy; Mitral valve repair (~ 2003); Cataract extraction w/PHACO (Right, 05/12/2015); Cataract extraction w/PHACO (Left, 06/12/2015); Total knee arthroplasty (Left, 02/20/2016); Injection knee (Right, 02/20/2016); Cardioversion (N/A, 04/14/2016); Laparoscopic cholecystectomy; Knee arthroscopy (Left); Joint replacement; Shoulder open rotator cuff repair (Left); Colonoscopy; Upper gi endoscopy; Multiple tooth extractions; Cardiac catheterization (02/14/2007); Shoulder arthroscopy with rotator cuff repair and open biceps tenodesis (Right, 01/26/2019); Eye surgery; and Total knee arthroplasty (Right, 07/06/2019).   His family history includes Congestive Heart Failure in his father; Diabetes Mellitus II in his brother and sister; Heart disease in his father; Lung cancer in his brother; Stroke in his brother, brother, sister, and sister.He reports that he quit smoking about 32 years ago. His smoking use included cigarettes. He has a 105.00 pack-year smoking history. He has never used smokeless tobacco. He reports that he does not drink alcohol and does not use drugs.  Current Outpatient Medications on File Prior to Visit  Medication Sig Dispense Refill   Calcium Carb-Cholecalciferol (CALCIUM 600 + D PO) Take 1 tablet by mouth daily.     Coenzyme Q10 300 MG CAPS Take 300 mg by mouth daily.     lisinopril (ZESTRIL) 20 MG tablet TAKE 1 TABLET BY MOUTH EVERYDAY AT BEDTIME 90 tablet 0   meclizine (ANTIVERT) 25 MG tablet Take 25 mg by mouth 3 (three) times daily as needed.     Multiple Vitamin (MULTIVITAMIN WITH MINERALS) TABS tablet Take 1 tablet by mouth daily.     No current facility-administered medications on file prior to visit.    ROS Review of Systems  Constitutional:  Negative for fever.  Respiratory:  Negative  for shortness of breath.    Cardiovascular:  Positive for leg swelling (when he is up and around a lot they will swell later in the day). Negative for chest pain.  Musculoskeletal:  Negative for arthralgias.  Skin:  Negative for rash.   Objective:  BP 131/77   Pulse 68   Temp (!) 97.3 F (36.3 C) (Temporal)   Resp 20   Ht $R'5\' 11"'XQ$  (1.803 m)   Wt 235 lb (106.6 kg)   SpO2 95%   BMI 32.78 kg/m   BP Readings from Last 3 Encounters:  01/01/21 131/77  12/01/20 108/60  10/06/20 125/74    Wt Readings from Last 3 Encounters:  01/01/21 235 lb (106.6 kg)  12/01/20 234 lb 12.8 oz (106.5 kg)  10/09/20 233 lb (105.7 kg)     Physical Exam Vitals reviewed.  Constitutional:      Appearance: He is well-developed.  HENT:     Head: Normocephalic and atraumatic.     Right Ear: External ear normal.     Left Ear: External ear normal.     Mouth/Throat:     Pharynx: No oropharyngeal exudate or posterior oropharyngeal erythema.  Eyes:     Pupils: Pupils are equal, round, and reactive to light.  Cardiovascular:     Rate and Rhythm: Normal rate and regular rhythm.     Heart sounds: No murmur heard. Pulmonary:     Effort: No respiratory distress.     Breath sounds: Normal breath sounds.  Musculoskeletal:     Cervical back: Normal range of motion and neck supple.     Right lower leg: No edema.     Left lower leg: No edema.  Neurological:     Mental Status: He is alert and oriented to person, place, and time.         Assessment & Plan:   [name] was seen today for medical management of chronic issues.  Diagnoses and all orders for this visit:  Type 2 diabetes mellitus with diabetic neuropathy, unspecified whether long term insulin use ([name]) -     Bayer DCA Hb A1c Waived -     CBC with Differential/Platelet -     CMP14+EGFR -     Lipid panel  Essential hypertension -     CBC with Differential/Platelet -     CMP14+EGFR -     Lipid panel  Dyslipidemia -     CBC with Differential/Platelet -      CMP14+EGFR -     Lipid panel  Type 2 diabetes mellitus without complication, without long-term current use of insulin (HCC) -     rosuvastatin (CRESTOR) 5 MG tablet; Take 1 tablet (5 mg total) by mouth daily. For cholesterol  Other orders -     silodosin (RAPAFLO) 4 MG CAPS capsule; Take 1 capsule (4 mg total) by mouth daily with breakfast. -     omeprazole (PRILOSEC) 40 MG capsule; Take 1 capsule (40 mg total) by mouth daily. -     metFORMIN (GLUCOPHAGE-XR) 500 MG 24 hr tablet; TAKE 1 TABLET BY MOUTH EVERY DAY WITH BREAKFAST -     apixaban (ELIQUIS) 5 MG TABS tablet; Take 1 tablet (5 mg total) by mouth 2 (two) times daily. -     amLODipine (NORVASC) 5 MG tablet; Take 1 tablet (5 mg total) by mouth daily.  I have discontinued Yanis A. Gandolfi's diclofenac Sodium, ciprofloxacin, and Baclofen. I am also having him maintain his multivitamin with minerals, Calcium Carb-Cholecalciferol (CALCIUM 600 +  D PO), Coenzyme Q10, meclizine, lisinopril, silodosin, rosuvastatin, omeprazole, metFORMIN, apixaban, and amLODipine.  Meds ordered this encounter  Medications   silodosin (RAPAFLO) 4 MG CAPS capsule    Sig: Take 1 capsule (4 mg total) by mouth daily with breakfast.    Dispense:  90 capsule    Refill:  3   rosuvastatin (CRESTOR) 5 MG tablet    Sig: Take 1 tablet (5 mg total) by mouth daily. For cholesterol    Dispense:  90 tablet    Refill:  1   omeprazole (PRILOSEC) 40 MG capsule    Sig: Take 1 capsule (40 mg total) by mouth daily.    Dispense:  90 capsule    Refill:  1   metFORMIN (GLUCOPHAGE-XR) 500 MG 24 hr tablet    Sig: TAKE 1 TABLET BY MOUTH EVERY DAY WITH BREAKFAST    Dispense:  90 tablet    Refill:  1   apixaban (ELIQUIS) 5 MG TABS tablet    Sig: Take 1 tablet (5 mg total) by mouth 2 (two) times daily.    Dispense:  180 tablet    Refill:  1   amLODipine (NORVASC) 5 MG tablet    Sig: Take 1 tablet (5 mg total) by mouth daily.    Dispense:  90 tablet    Refill:  1    Pt.  Having some AM orthostasis that sounds manageable by easing him self out of bed slowly in stages while also  sipping a glass of water.   Follow-up: Return in about 3 months (around 04/03/2021).  Claretta Fraise, M.D.

## 2021-01-02 LAB — CBC WITH DIFFERENTIAL/PLATELET
Basophils Absolute: 0 10*3/uL (ref 0.0–0.2)
Basos: 0 %
EOS (ABSOLUTE): 0.2 10*3/uL (ref 0.0–0.4)
Eos: 4 %
Hematocrit: 41.6 % (ref 37.5–51.0)
Hemoglobin: 13.9 g/dL (ref 13.0–17.7)
Immature Grans (Abs): 0 10*3/uL (ref 0.0–0.1)
Immature Granulocytes: 1 %
Lymphocytes Absolute: 1.5 10*3/uL (ref 0.7–3.1)
Lymphs: 24 %
MCH: 31.5 pg (ref 26.6–33.0)
MCHC: 33.4 g/dL (ref 31.5–35.7)
MCV: 94 fL (ref 79–97)
Monocytes Absolute: 0.6 10*3/uL (ref 0.1–0.9)
Monocytes: 9 %
Neutrophils Absolute: 4.1 10*3/uL (ref 1.4–7.0)
Neutrophils: 62 %
Platelets: 219 10*3/uL (ref 150–450)
RBC: 4.41 x10E6/uL (ref 4.14–5.80)
RDW: 12.4 % (ref 11.6–15.4)
WBC: 6.4 10*3/uL (ref 3.4–10.8)

## 2021-01-02 LAB — LIPID PANEL
Chol/HDL Ratio: 2.5 ratio (ref 0.0–5.0)
Cholesterol, Total: 113 mg/dL (ref 100–199)
HDL: 45 mg/dL (ref >39)
LDL Chol Calc (NIH): 51 mg/dL (ref 0–99)
Triglycerides: 87 mg/dL (ref 0–149)
VLDL Cholesterol Cal: 17 mg/dL (ref 5–40)

## 2021-01-02 LAB — CMP14+EGFR
ALT: 12 IU/L (ref 0–44)
AST: 15 IU/L (ref 0–40)
Albumin/Globulin Ratio: 1.9 (ref 1.2–2.2)
Albumin: 4.2 g/dL (ref 3.6–4.6)
Alkaline Phosphatase: 78 IU/L (ref 44–121)
BUN/Creatinine Ratio: 19 (ref 10–24)
BUN: 15 mg/dL (ref 8–27)
Bilirubin Total: 0.7 mg/dL (ref 0.0–1.2)
CO2: 26 mmol/L (ref 20–29)
Calcium: 8.7 mg/dL (ref 8.6–10.2)
Chloride: 105 mmol/L (ref 96–106)
Creatinine, Ser: 0.78 mg/dL (ref 0.76–1.27)
Globulin, Total: 2.2 g/dL (ref 1.5–4.5)
Glucose: 129 mg/dL — ABNORMAL HIGH (ref 70–99)
Potassium: 4.1 mmol/L (ref 3.5–5.2)
Sodium: 147 mmol/L — ABNORMAL HIGH (ref 134–144)
Total Protein: 6.4 g/dL (ref 6.0–8.5)
eGFR: 88 mL/min/{1.73_m2} (ref >59)

## 2021-01-05 NOTE — Progress Notes (Signed)
Hello Jewett,  Your lab result is normal and/or stable.Some minor variations that are not significant are commonly marked abnormal, but do not represent any medical problem for you.  Best regards, Terrionna Bridwell, M.D.

## 2021-01-07 IMAGING — US US SCROTUM W/ DOPPLER COMPLETE
1 series · 14 of 25 positions shown · non-contrast
Comparison: None.

CLINICAL DATA: Acute swelling over the last day on the right side.

EXAM:
SCROTAL ULTRASOUND
DOPPLER ULTRASOUND OF THE TESTICLES
TECHNIQUE: Complete ultrasound examination of the testicles, epididymis, and
other scrotal structures was performed. Color and spectral Doppler
ultrasound were also utilized to evaluate blood flow to the
testicles.

[Series 1: us scrotum w/ doppler complete · 0.06mm/px · 14 of 107 slices shown]
[im 1/107]
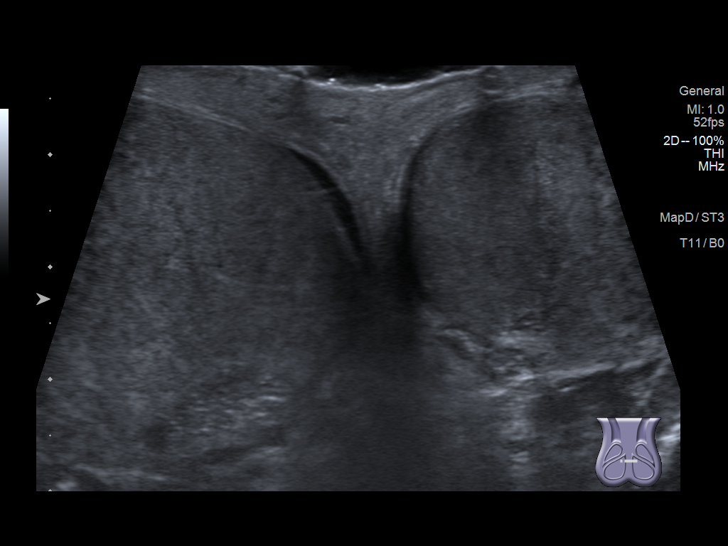
[im 9/107]
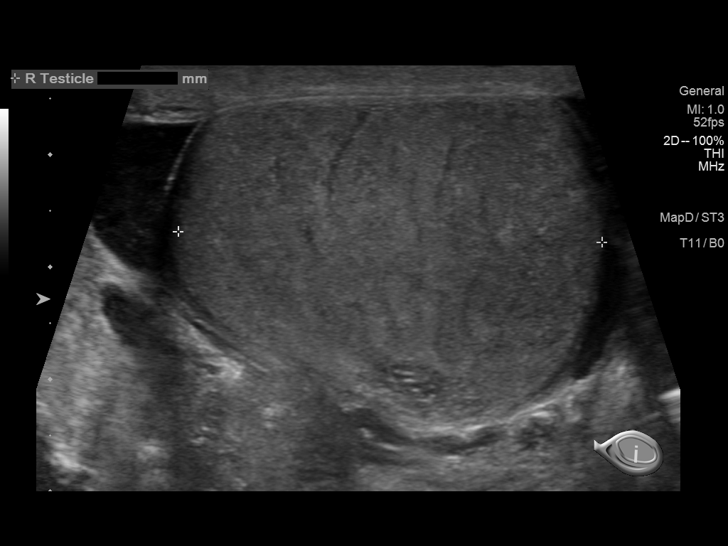
[im 18/107]
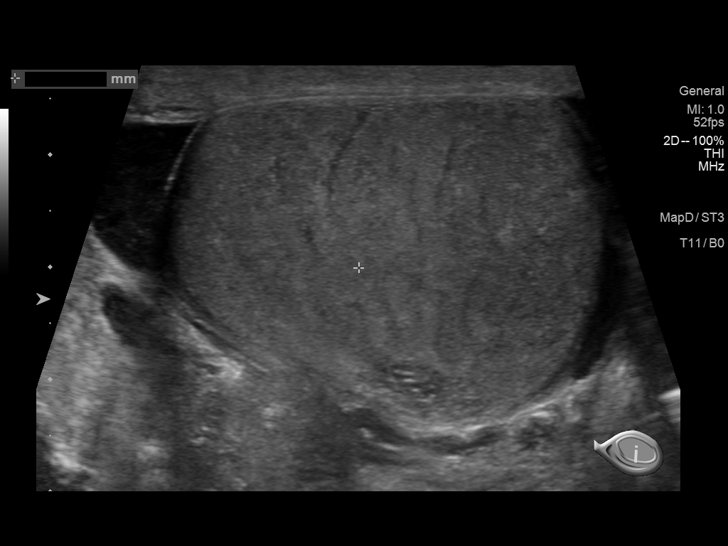
[im 27/107]
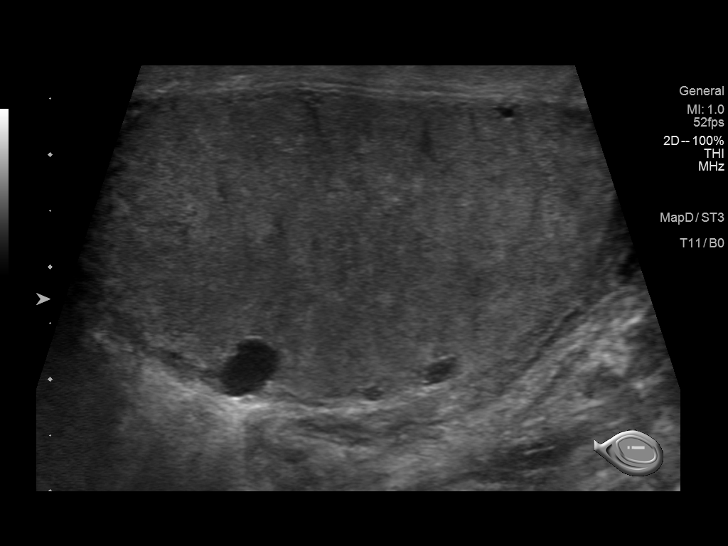
[im 36/107]
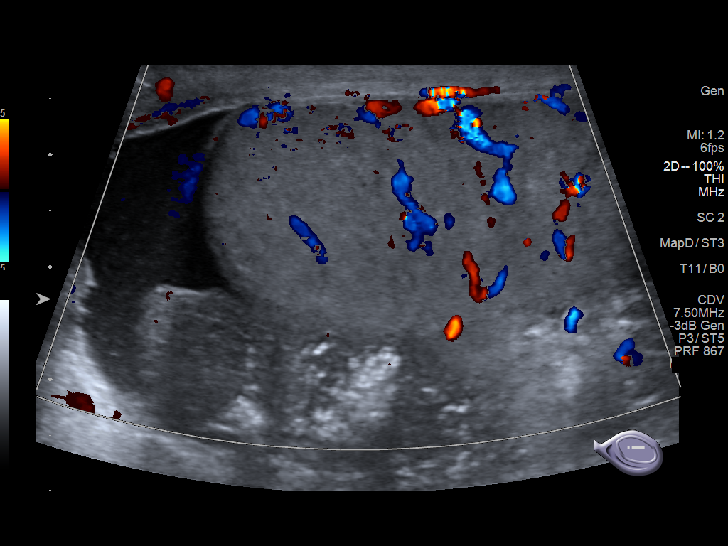
[im 40/107]
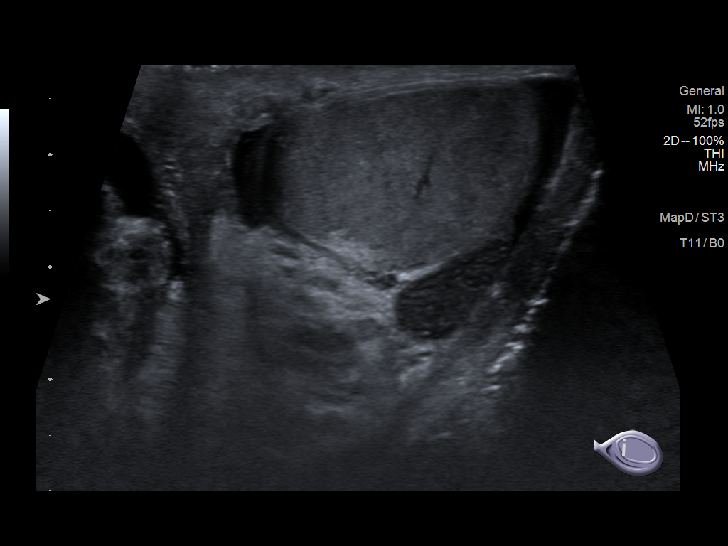
[im 49/107]
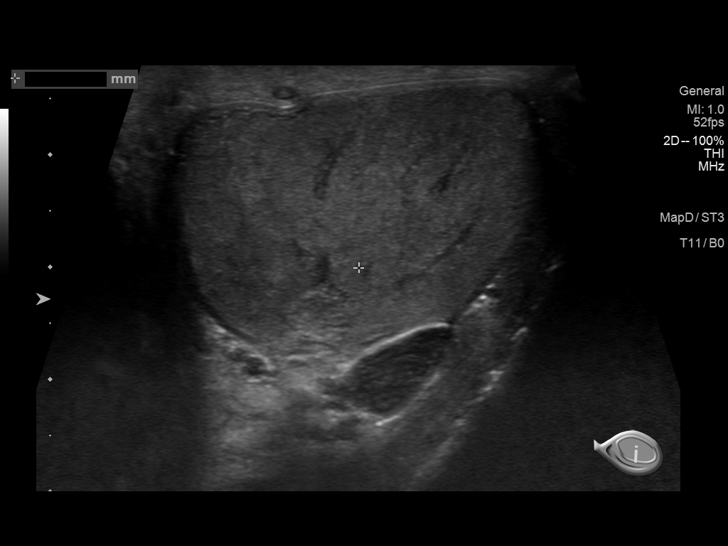
[im 58/107]
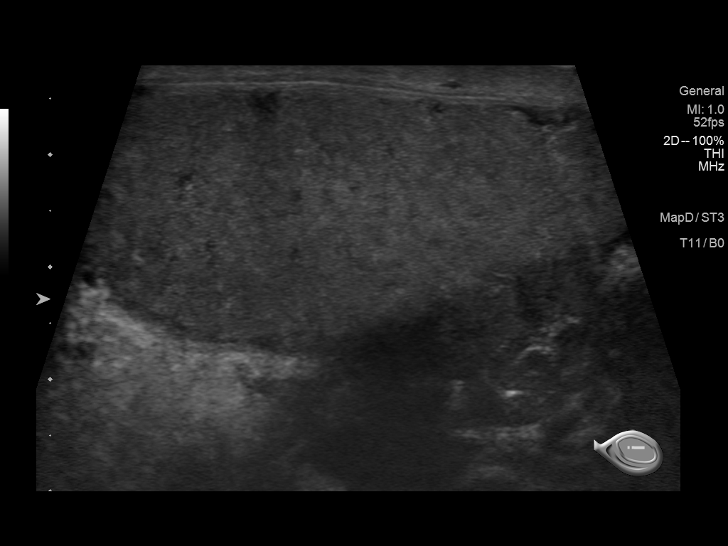
[im 67/107]
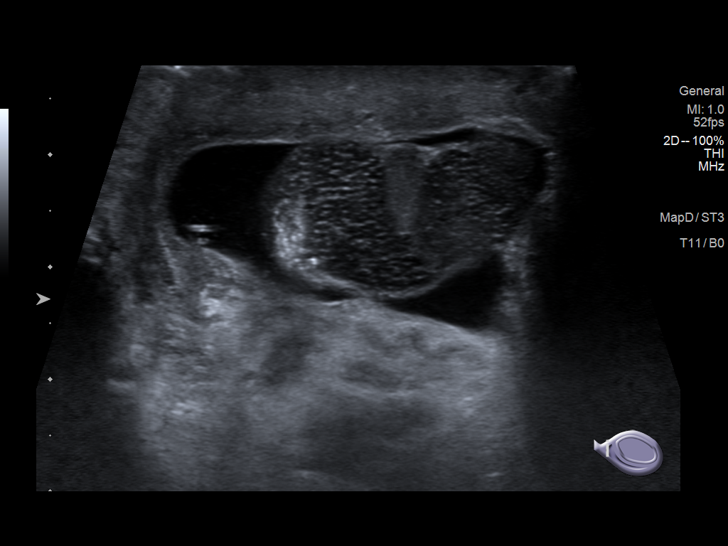
[im 71/107]
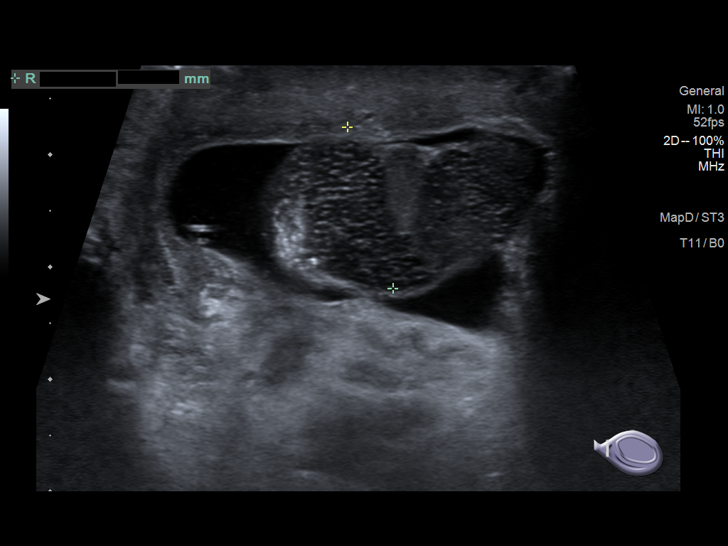
[im 80/107]
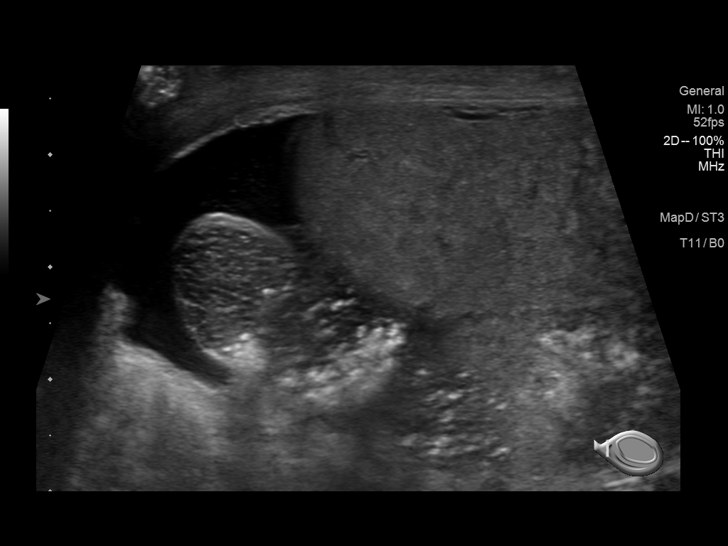
[im 89/107]
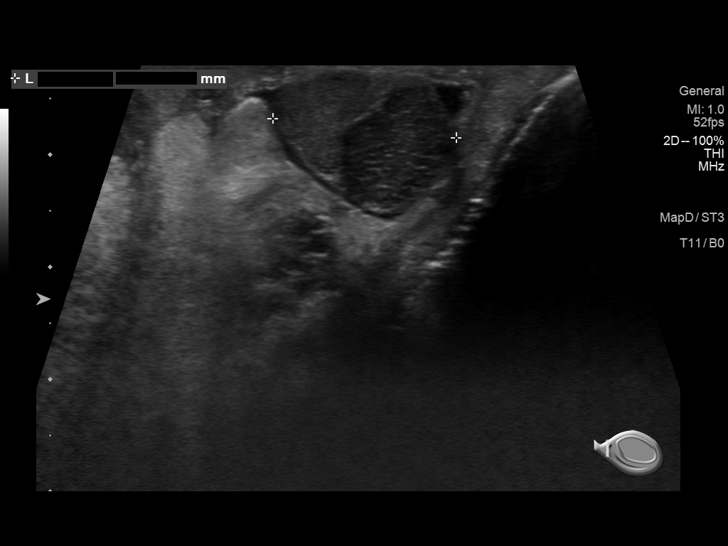
[im 98/107]
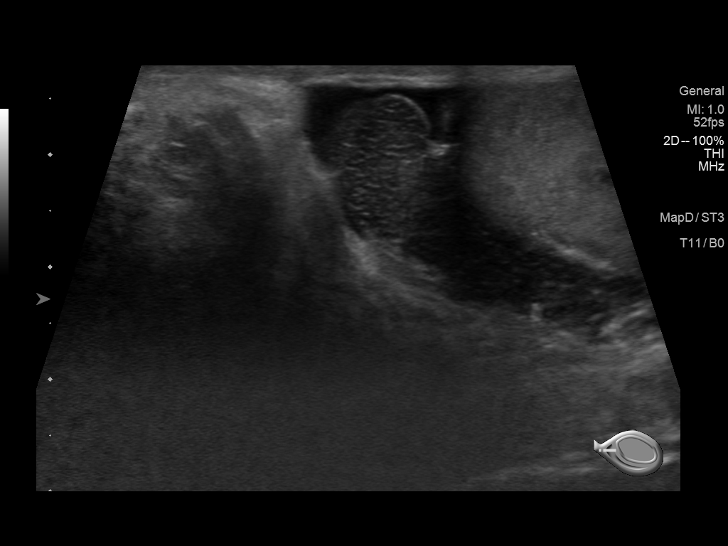
[im 107/107]
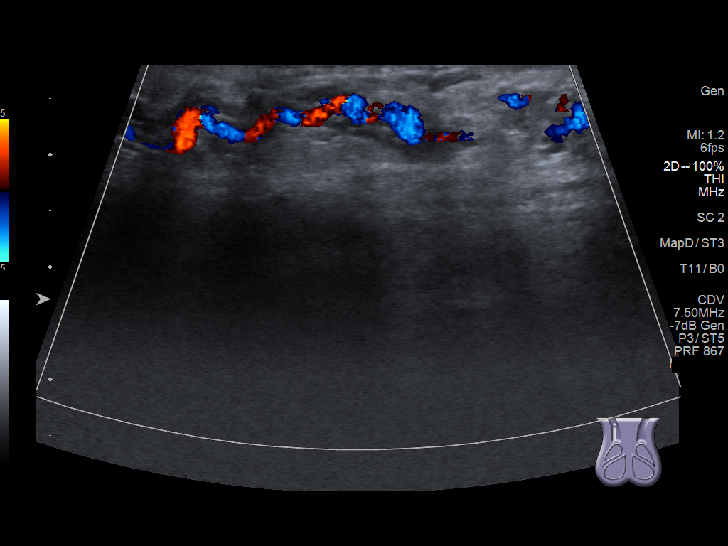

[14 of 25 positions shown; findings below may reference images not displayed]

FINDINGS: Right testicle

Measurements: 5.4 x 2.9 x 3.7 cm. No mass lesion. Incidental 6 mm
testicular cyst. Mild hyperemia.

Left testicle

Measurements:  5.4 x 2.3 x 3.2 cm.  No mass lesion.  No hyperemia.

Right epididymis:  Mild enlargement and hyperemia.

Left epididymis:  Normal in size and appearance.

Hydrocele:  Small right hydrocele.

Varicocele:  None visualized.

Pulsed Doppler interrogation of both testes demonstrates normal low
resistance arterial and venous waveforms bilaterally.
IMPRESSION: Suspicion of early epididymo-orchitis on the right. Small right
hydrocele.

## 2021-02-12 ENCOUNTER — Ambulatory Visit (INDEPENDENT_AMBULATORY_CARE_PROVIDER_SITE_OTHER): Payer: Medicare Other | Admitting: *Deleted

## 2021-02-12 DIAGNOSIS — Z0000 Encounter for general adult medical examination without abnormal findings: Secondary | ICD-10-CM

## 2021-02-12 NOTE — Progress Notes (Signed)
MEDICARE ANNUAL WELLNESS VISIT  02/12/2021  Telephone Visit Disclaimer This Medicare AWV was conducted by telephone due to national recommendations for restrictions regarding the COVID-19 Pandemic (e.g. social distancing).  I verified, using two identifiers, that I am speaking with [name] or their authorized healthcare agent. I discussed the limitations, risks, security, and privacy concerns of performing an evaluation and management service by telephone and the potential availability of an in-person appointment in the future. The patient expressed understanding and agreed to proceed.  Location of Patient: home Location of Provider (nurse):  office  Subjective:    [name] is a 85 y.o. male patient of [name], [name] Gash, MD who had a Medicare Annual Wellness Visit today via telephone. [name] is Retired and lives with their spouse. he has 2 children. he reports that he is socially active and does interact with friends/family regularly. he is markedly physically active and enjoys staying active in [place], playing golf, yard work and going to [employer].  Patient Care Team: Claretta Fraise, MD as PCP - General (Family Medicine) Minus Breeding, MD as PCP - Cardiology (Cardiology) Steffanie Rainwater, DPM as Consulting Physician (Podiatry) de Stanford Scotland, MD (Inactive) as Attending Physician (Cardiology) Celestia Khat, [place] (Optometry)  Advanced Directives 02/12/2021 07/02/2019 02/05/2019 01/26/2019 10/01/2018 10/24/2017 07/21/2016  Does Patient Have a Medical Advance Directive? Yes Yes Yes Yes No Yes Yes  Type of Paramedic of [place];Living will [place];Living will [place];Living will Living will - Living will [place];Living will  Does patient want to make changes to medical advance directive? No - Patient declined No - Patient declined No - Patient declined - - No - Patient declined No - Patient  declined  Copy of [place] in Chart? No - copy requested No - copy requested No - copy requested - - - No - copy requested  Would patient like information on creating a medical advance directive? - - - - No - Patient declined - [facility] Utilization Over the Past 12 Months: # of hospitalizations or ER visits: 0 # of surgeries: 0  Review of Systems    Patient reports that his overall health is better compared to last year.  History obtained from chart review and the patient  Patient Reported Readings (BP, Pulse, CBG, Weight, etc) none  Pain Assessment Pain : No/denies pain     Current Medications & Allergies (verified) Allergies as of 02/12/2021       Reactions   Flomax [tamsulosin Hcl] Other (See Comments)   Makes him "swimmy headed"        Medication List        Accurate as of February 12, 2021  2:46 PM. If you have any questions, ask your nurse or doctor.          amLODipine 5 MG tablet Commonly known as: NORVASC Take 1 tablet (5 mg total) by mouth daily.   apixaban 5 MG Tabs tablet Commonly known as: Eliquis Take 1 tablet (5 mg total) by mouth 2 (two) times daily.   CALCIUM 600 + D PO Take 1 tablet by mouth daily.   Coenzyme Q10 300 MG Caps Take 300 mg by mouth daily.   lisinopril 20 MG tablet Commonly known as: ZESTRIL TAKE 1 TABLET BY MOUTH EVERYDAY AT BEDTIME   meclizine 25 MG tablet Commonly known as: ANTIVERT Take 25 mg by mouth 3 (three) times daily  as needed.   metFORMIN 500 MG 24 hr tablet Commonly known as: GLUCOPHAGE-XR TAKE 1 TABLET BY MOUTH EVERY DAY WITH BREAKFAST   multivitamin with minerals Tabs tablet Take 1 tablet by mouth daily.   omeprazole 40 MG capsule Commonly known as: PRILOSEC Take 1 capsule (40 mg total) by mouth daily.   rosuvastatin 5 MG tablet Commonly known as: CRESTOR Take 1 tablet (5 mg total) by mouth daily. For cholesterol   silodosin 4 MG Caps capsule Commonly known as:  RAPAFLO Take 1 capsule (4 mg total) by mouth daily with breakfast.        History (reviewed): Past Medical History:  Diagnosis Date   Arthritis    knees, shoulder,  hips (10/24/2017)   Atrial fibrillation (HCC)    on eliquis   BPH (benign prostatic hyperplasia)    Cataracts, bilateral    removed by surgery   Coronary artery disease excluded    Diabetes mellitus without complication (HCC)    type 2   Diverticulosis    Endocarditis, valve unspecified, unspecified cause    GERD (gastroesophageal reflux disease)    H/O mitral valve repair    Postoperative ring with postoperative atrial fibrillation   Hearing loss    both ears - does not use hearing aids   Heart murmur    hx   Hiatal hernia 06/13/2002   High cholesterol    History of kidney stones    passed spontaneously x2    OSA on CPAP    uses cpap nightly   Stroke ([place])    Unspecified essential hypertension    Weakness of both arms 07/19/2012   Wears dentures    upper and lower partial   Past Surgical History:  Procedure Laterality Date   CARDIAC CATHETERIZATION  02/14/2007   x 2   CARDIOVERSION N/A 04/14/2016   Procedure: CARDIOVERSION;  Surgeon: Minus Breeding, MD;  Location: [facility];  Service: Cardiovascular;  Laterality: N/A;   CATARACT EXTRACTION W/PHACO Right 05/12/2015   Procedure: CATARACT EXTRACTION PHACO AND INTRAOCULAR LENS PLACEMENT RIGHT EYE CDE=7.75;  Surgeon: Tonny Branch, MD;  Location: [facility];  Service: Ophthalmology;  Laterality: Right;   CATARACT EXTRACTION W/PHACO Left 06/12/2015   Procedure: CATARACT EXTRACTION PHACO AND INTRAOCULAR LENS PLACEMENT (IOC);  Surgeon: Tonny Branch, MD;  Location: [facility];  Service: Ophthalmology;  Laterality: Left;  CDE:  13.17   COLONOSCOPY     EYE SURGERY     Bilateral cataracts   INJECTION KNEE Right 02/20/2016   Procedure: KNEE INJECTION;  Surgeon: [name], MD;  Location: [facility];  Service: Orthopedics;  Laterality: Right;   JOINT REPLACEMENT     KNEE ARTHROSCOPY  Left    LAPAROSCOPIC CHOLECYSTECTOMY     MITRAL VALVE REPAIR  ~ 2003   MULTIPLE TOOTH EXTRACTIONS     SHOULDER ARTHROSCOPY WITH ROTATOR CUFF REPAIR AND OPEN BICEPS TENODESIS Right 01/26/2019   Procedure: right shoulder arthroscopy, rotator cuff repair and biceps tenodesis;  Surgeon: [name], MD;  Location: [facility];  Service: Orthopedics;  Laterality: Right;   SHOULDER OPEN ROTATOR CUFF REPAIR Left    TOTAL KNEE ARTHROPLASTY Left 02/20/2016   Procedure: LEFT TOTAL KNEE ARTHROPLASTY WITH RIGHT KNEE INJECTION;  Surgeon: [name], MD;  Location: [facility];  Service: Orthopedics;  Laterality: Left;   TOTAL KNEE ARTHROPLASTY Right 07/06/2019   Procedure: RIGHT TOTAL KNEE ARTHROPLASTY;  Surgeon: [name], MD;  Location: [facility];  Service: Orthopedics;  Laterality: Right;   UPPER  GI ENDOSCOPY     VASECTOMY     Family History  Problem Relation Age of Onset   Congestive Heart Failure Father    Heart disease Father    Stroke Brother    Stroke Sister    Diabetes Mellitus II Brother    Diabetes Mellitus II Sister    Lung cancer Brother    Stroke Sister    Stroke Brother    Colon cancer Neg Hx    Esophageal cancer Neg Hx    Rectal cancer Neg Hx    Stomach cancer Neg Hx    Social History   Socioeconomic History   Marital status: Married    Spouse name: GLORIA   Number of children: 2   Years of education: 12   Highest education level: High school graduate  Occupational History   Occupation:  Environmental manager    Comment: RETIRED  Tobacco Use   Smoking status: Former    Packs/day: 3.00    Years: 35.00    Pack years: 105.00    Types: Cigarettes    Quit date: 02/16/1988    Years since quitting: 33.0   Smokeless tobacco: Never   Tobacco comments:     Year Quit: 1990   Vaping Use   Vaping Use: Never used  Substance and Sexual Activity   Alcohol use: No   Drug use: Never   Sexual activity: Not Currently  Other Topics Concern   Not on file  Social History Narrative   Not on  file   Social Determinants of Health   Financial Resource Strain: Low Risk    Difficulty of Paying Living Expenses: Not hard at all  Food Insecurity: No Food Insecurity   Worried About Charity fundraiser in the Last Year: Never true   [place] in the Last Year: Never true  Transportation Needs: No Transportation Needs   Lack of Transportation (Medical): No   Lack of Transportation (Non-Medical): No  Physical Activity: Sufficiently Active   Days of Exercise per Week: 4 days   Minutes of Exercise per Session: 50 min  Stress: No Stress Concern Present   Feeling of Stress : Not at all  Social Connections: Socially Integrated   Frequency of Communication with Friends and Family: More than three times a week   Frequency of Social Gatherings with Friends and Family: More than three times a week   Attends Religious Services: More than 4 times per year   Active Member of [employer] or Organizations: Yes   Attends Archivist Meetings: More than 4 times per year   Marital Status: Married    Activities of Daily Living In your present state of health, do you have any difficulty performing the following activities: 02/12/2021 02/08/2021  Hearing? N Y  Comment wears hearing aids -  Vision? N N  Comment wears rx reading glasses-needs to schedule Diabetic Eye Exam in January -  Difficulty concentrating or making decisions? N N  Walking or climbing stairs? N N  Dressing or bathing? N N  Doing errands, shopping? N N  Preparing Food and eating ? N N  Using the Toilet? N N  In the past six months, have you accidently leaked urine? Y N  Comment rarely -  Do you have problems with loss of bowel control? N N  Managing your Medications? N N  Managing your Finances? N N  Housekeeping or managing your Housekeeping? N N  Some recent data might be hidden  Patient Education/ Literacy How often do you need to have someone help you when you read instructions, pamphlets, or other  written materials from your doctor or pharmacy?: 1 - Never What is the last grade level you completed in school?: High School  Exercise Current Exercise Habits: Home exercise routine, Type of exercise: walking;Other - see comments (gym), Time (Minutes): 45, Frequency (Times/Week): 4, Weekly Exercise (Minutes/Week): 180, Intensity: Moderate, Exercise limited by: None identified  Diet Patient reports consuming 3 meals a day and 2 snack(s) a day Patient reports that his primary diet is: Diabetic Patient reports that she does have regular access to food.   Depression Screen PHQ 2/9 Scores 02/12/2021 01/01/2021 10/06/2020 10/01/2020 10/01/2020 09/22/2020 09/15/2020  PHQ - 2 Score 0 0 0 0 1 0 0  PHQ- 9 Score - 0 - 0 6 0 -     Fall Risk Fall Risk  02/12/2021 02/08/2021 01/01/2021 10/06/2020 10/01/2020  Falls in the past year? 0 0 0 0 0  Number falls in past yr: - 0 - - -  Injury with Fall? - 0 - - -  Comment - - - - -  Risk for fall due to : - - - - -  Follow up - - Falls evaluation completed - -     Objective:  [name] seemed alert and oriented and he participated appropriately during our telephone visit.  Blood Pressure Weight BMI  BP Readings from Last 3 Encounters:  01/01/21 131/77  12/01/20 108/60  10/06/20 125/74   Wt Readings from Last 3 Encounters:  01/01/21 235 lb (106.6 kg)  12/01/20 234 lb 12.8 oz (106.5 kg)  10/09/20 233 lb (105.7 kg)   BMI Readings from Last 1 Encounters:  01/01/21 32.78 kg/m    *Unable to obtain current vital signs, weight, and BMI due to telephone visit type  Hearing/Vision  Jaelen did not seem to have difficulty with hearing/understanding during the telephone conversation Reports that he has had a formal eye exam by an eye care professional within the past year Reports that he has had a formal hearing evaluation within the past year *Unable to fully assess hearing and vision during telephone visit type  Cognitive Function: 6CIT Screen  02/12/2021 02/06/2020 02/05/2019  What Year? 0 points 0 points 0 points  What month? 0 points 0 points 0 points  What time? 0 points 0 points 0 points  Count back from 20 0 points 0 points 0 points  Months in reverse 0 points 0 points 0 points  Repeat phrase 0 points 0 points 4 points  Total Score 0 0 4   (Normal:0-7, Significant for Dysfunction: >8)  Normal Cognitive Function Screening: Yes   Immunization & Health Maintenance Record Immunization History  Administered Date(s) Administered   Fluad Quad(high Dose 65+) 11/14/2018   Influenza Inj Mdck Quad Pf 11/14/2018   Influenza Nasal 11/29/2016   Influenza Split 01/15/2004, 11/15/2013, 12/03/2015, 11/28/2019   Influenza, High Dose Seasonal PF 11/25/2014, 11/29/2016, 11/24/2017, 11/22/2019, 11/19/2020   Influenza, Seasonal, Injecte, Preservative Fre 11/15/2012   Influenza-Unspecified 01/23/2008, 11/15/2012, 11/29/2013, 11/25/2014, 11/29/2016, 11/24/2017, 11/14/2018, 11/22/2019   Moderna SARS-COV2 Booster Vaccination 12/10/2019   Moderna Sars-Covid-2 Vaccination 03/20/2019, 04/17/2019, 12/10/2019   Pneumococcal Conjugate-13 01/22/2015   Pneumococcal Polysaccharide-23 02/15/2006   Td 02/16/1995   Tdap 09/21/1994, 01/17/2014   Zoster Recombinat (Shingrix) 07/28/2020, 10/30/2020    Health Maintenance  Topic Date Due   OPHTHALMOLOGY EXAM  Never done   COVID-19 Vaccine (4 - Booster for Moderna series)  02/04/2020   FOOT EXAM  07/01/2021   HEMOGLOBIN A1C  07/01/2021   TETANUS/TDAP  01/18/2024   Pneumonia Vaccine 60+ Years old  Completed   INFLUENZA VACCINE  Completed   Zoster Vaccines- Shingrix  Completed   HPV VACCINES  Aged Out       Assessment  This is a routine wellness examination for [employer].  Health Maintenance: Due or Overdue Health Maintenance Due  Topic Date Due   OPHTHALMOLOGY EXAM  Never done   COVID-19 Vaccine (4 - Booster for Moderna series) 02/04/2020    [name] does not need a referral  for Community Assistance: Care Management:   no Social Work:    no Prescription Assistance:  no Nutrition/Diabetes Education:  no   Plan:  Personalized Goals  Goals Addressed             This Visit's Progress    Weight (lb) < 210 lb (95.3 kg)         Personalized Health Maintenance & Screening Recommendations  Advanced directives: has an advanced directive - a copy HAS NOT been provided. Diabetic Eye Exam  Lung Cancer Screening Recommended: no (Low Dose CT Chest recommended if Age 45-80 years, 30 pack-year currently smoking OR have quit w/in past 15 years) Hepatitis C Screening recommended: no HIV Screening recommended: no  Advanced Directives: Written information was not prepared per patient's request.  Referrals & Orders No orders of the defined types were placed in this encounter.   Follow-up Plan Follow-up with Claretta Fraise, MD as planned Schedule your Diabetic Eye Exam in January as discussed  Please bring a copy of your Advanced Directives in for our records   I have personally reviewed and noted the following in the patients chart:   Medical and social history Use of alcohol, tobacco or illicit drugs  Current medications and supplements Functional ability and status Nutritional status Physical activity Advanced directives List of other physicians Hospitalizations, surgeries, and ER visits in previous 12 months Vitals Screenings to include cognitive, depression, and falls Referrals and appointments  In addition, I have reviewed and discussed with [name] certain preventive protocols, quality metrics, and best practice recommendations. A written personalized care plan for preventive services as well as general preventive health recommendations is available and can be mailed to the patient at his request.      Milas Hock, LPN  [id number]

## 2021-02-18 IMAGING — CR DG CHEST 2V
2 series · 2 of 2 positions shown · non-contrast
Comparison: February 11, 2016

CLINICAL DATA: Preoperative assessment for total knee arthroplasty

EXAM:
CHEST - 2 VIEW

[w chest pa]
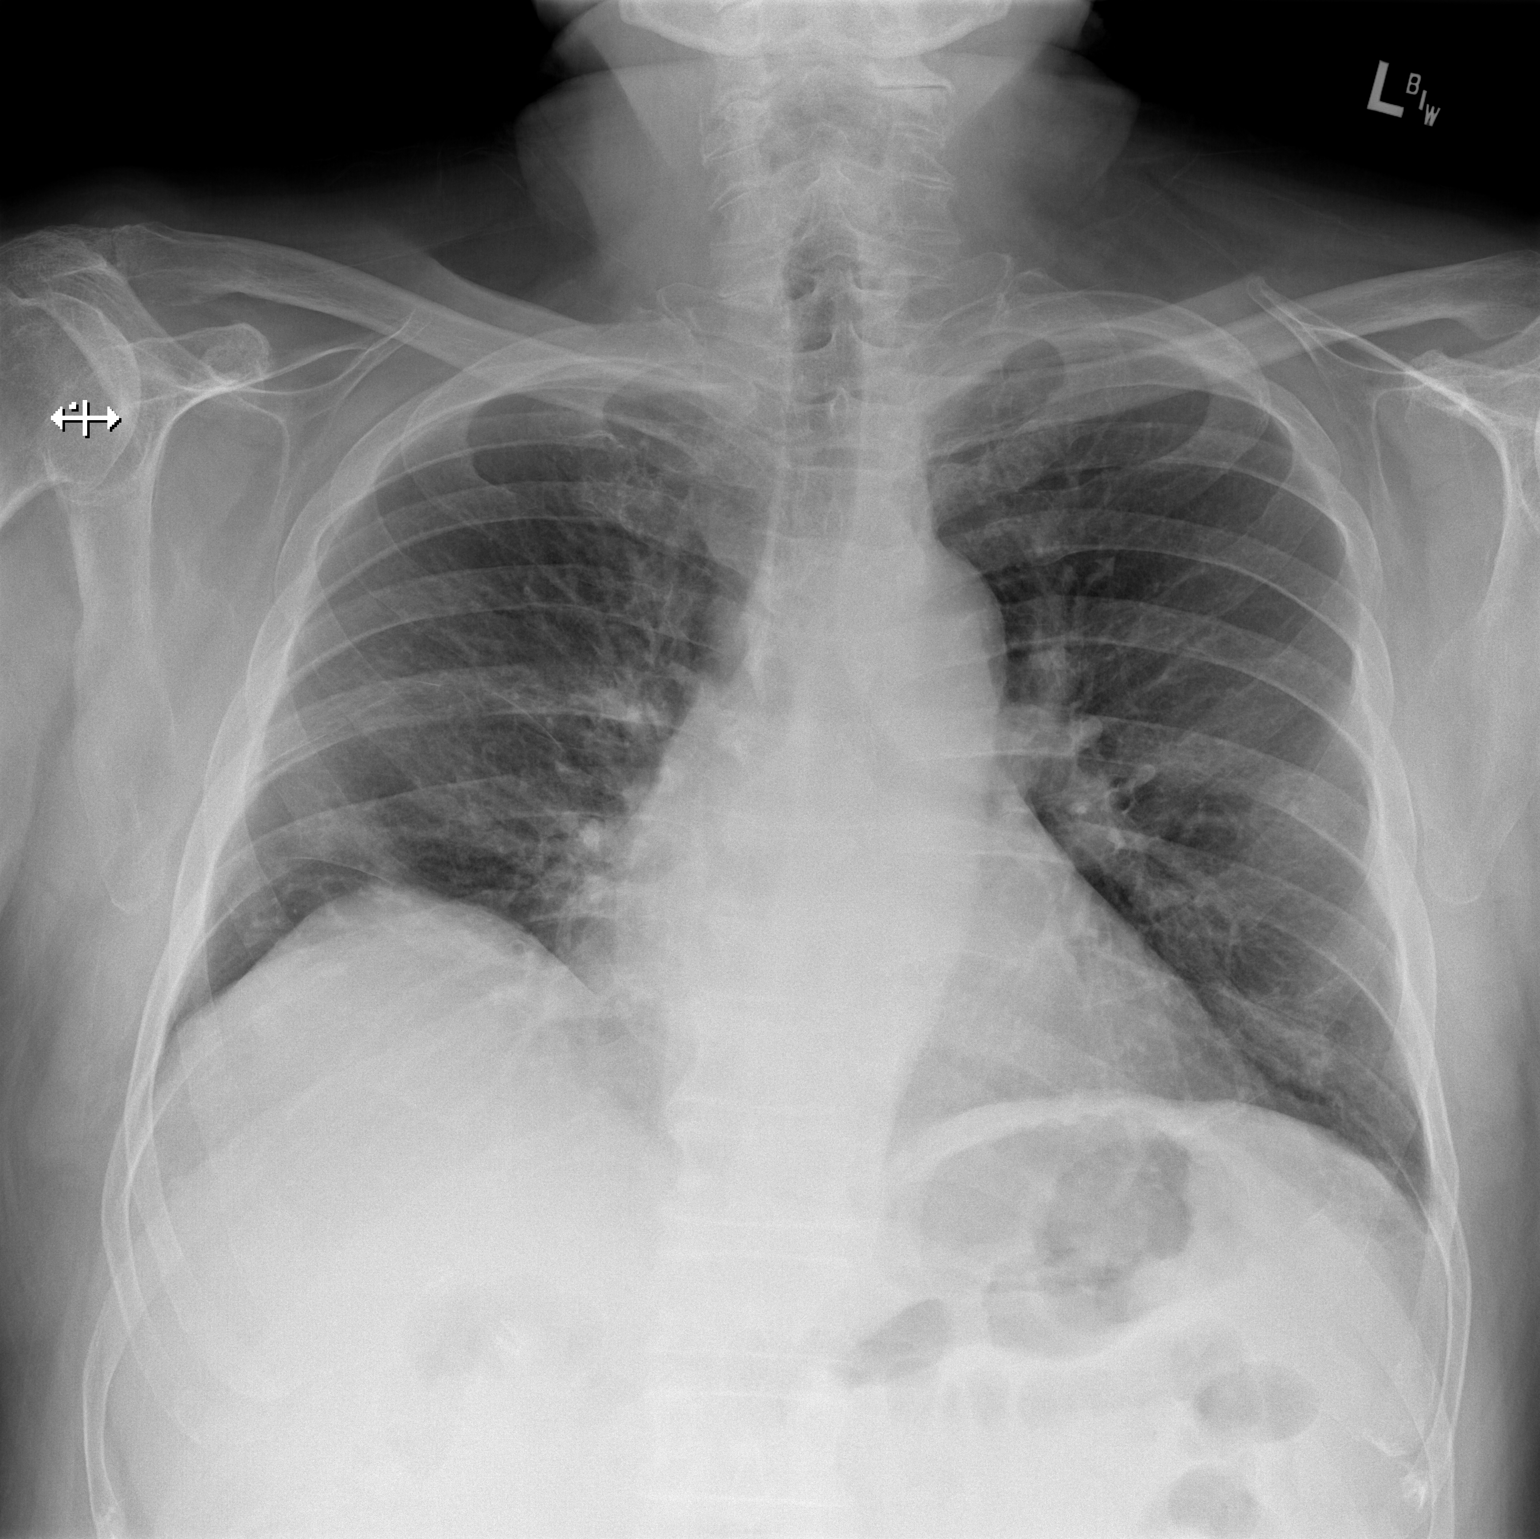

[w chest lat]
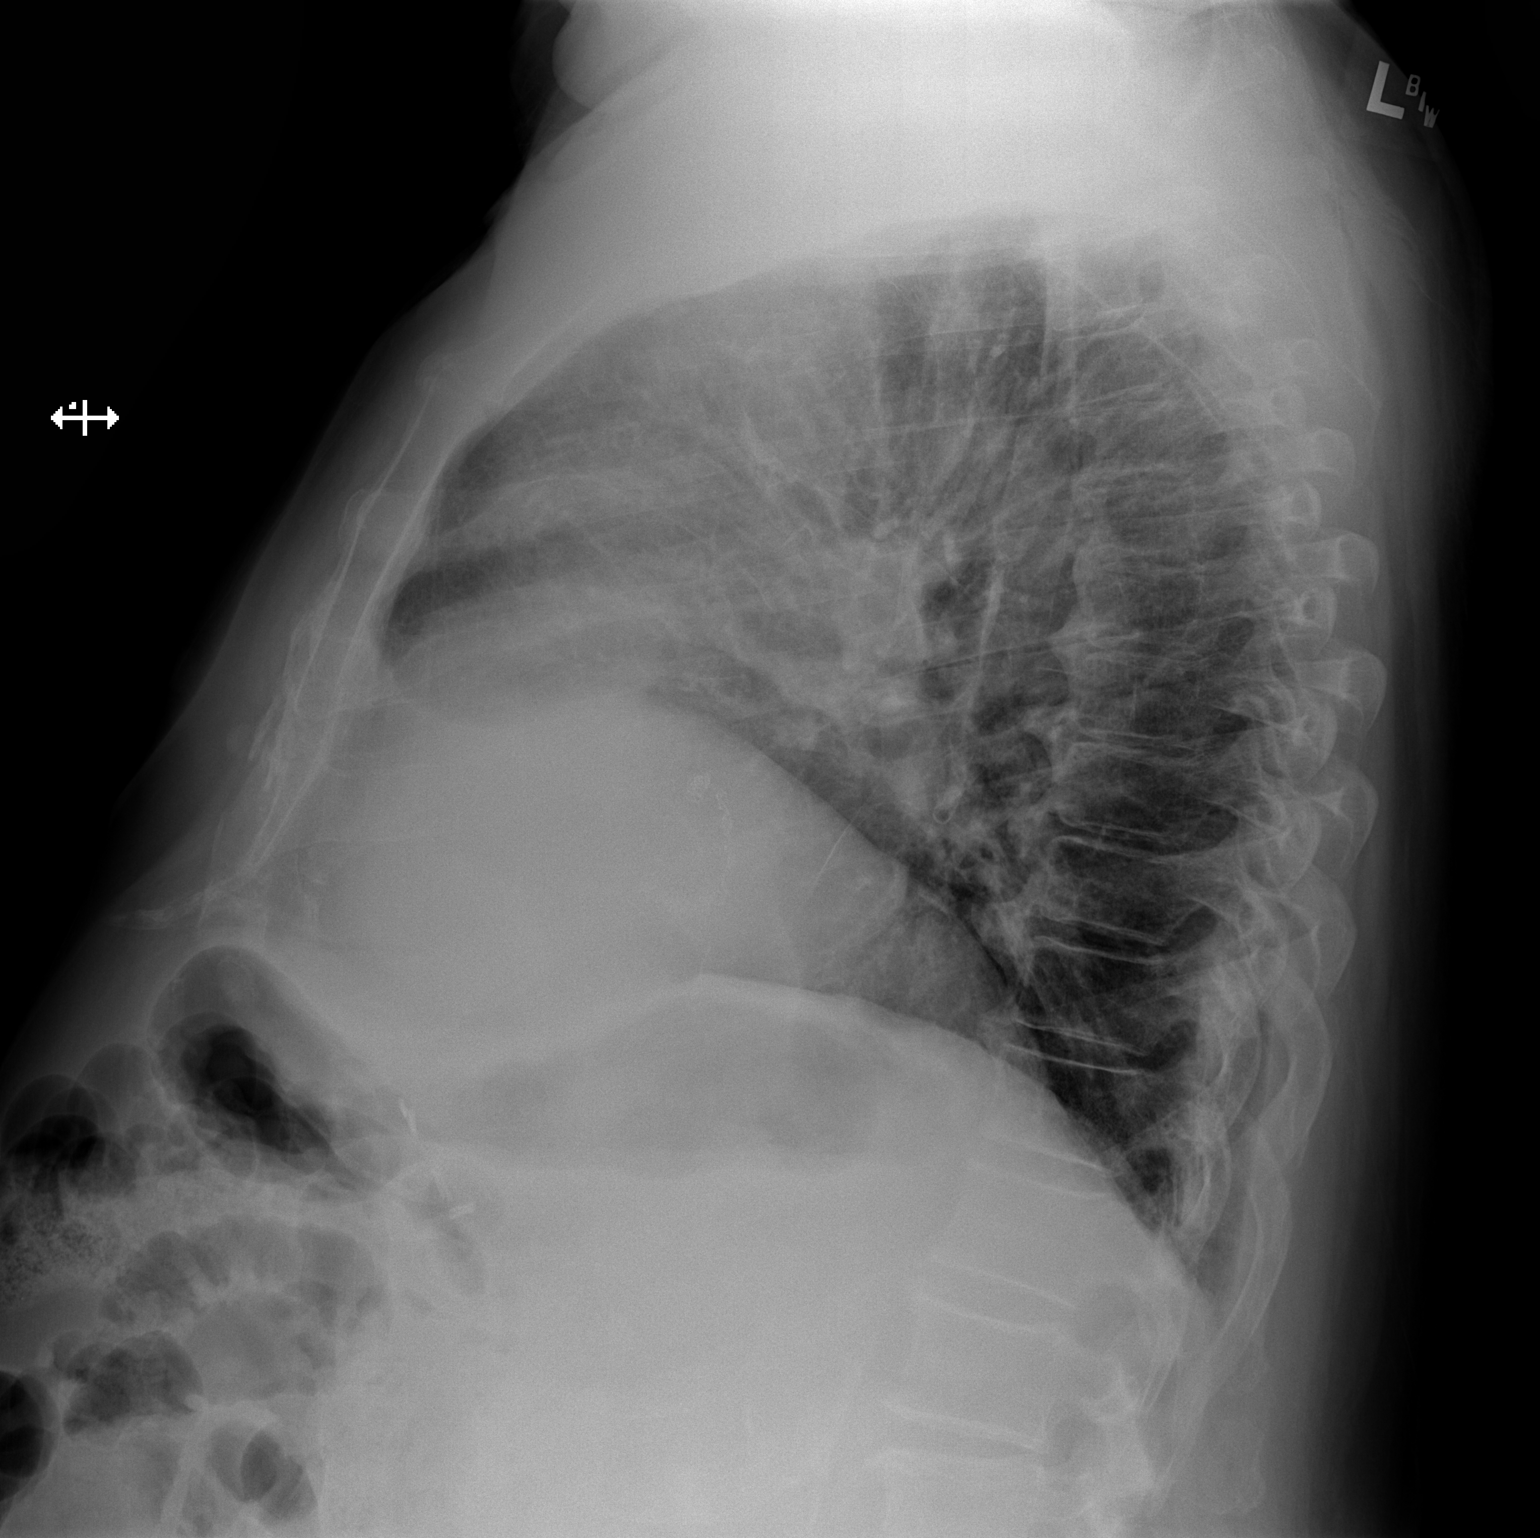

[2 of 2 positions shown; findings below may reference images not displayed]

FINDINGS: There is minimal scarring in the left base. Lungs elsewhere clear.
Heart size and pulmonary vascularity are normal. No adenopathy.
There is degenerative change in the thoracic spine.
IMPRESSION: Mild scarring left base. Lungs elsewhere clear. Cardiac silhouette
within normal limits.

## 2021-02-22 IMAGING — DX DG KNEE 1-2V PORT*R*
1 series · 2 of 2 positions shown · non-contrast
Comparison: 10/06/2018

CLINICAL DATA: Total knee replacement on the right.

EXAM:
PORTABLE RIGHT KNEE - 1-2 VIEW

[Series 1: knee · 0.14mm/px · 2 of 2 slices shown]
[im 1/2]
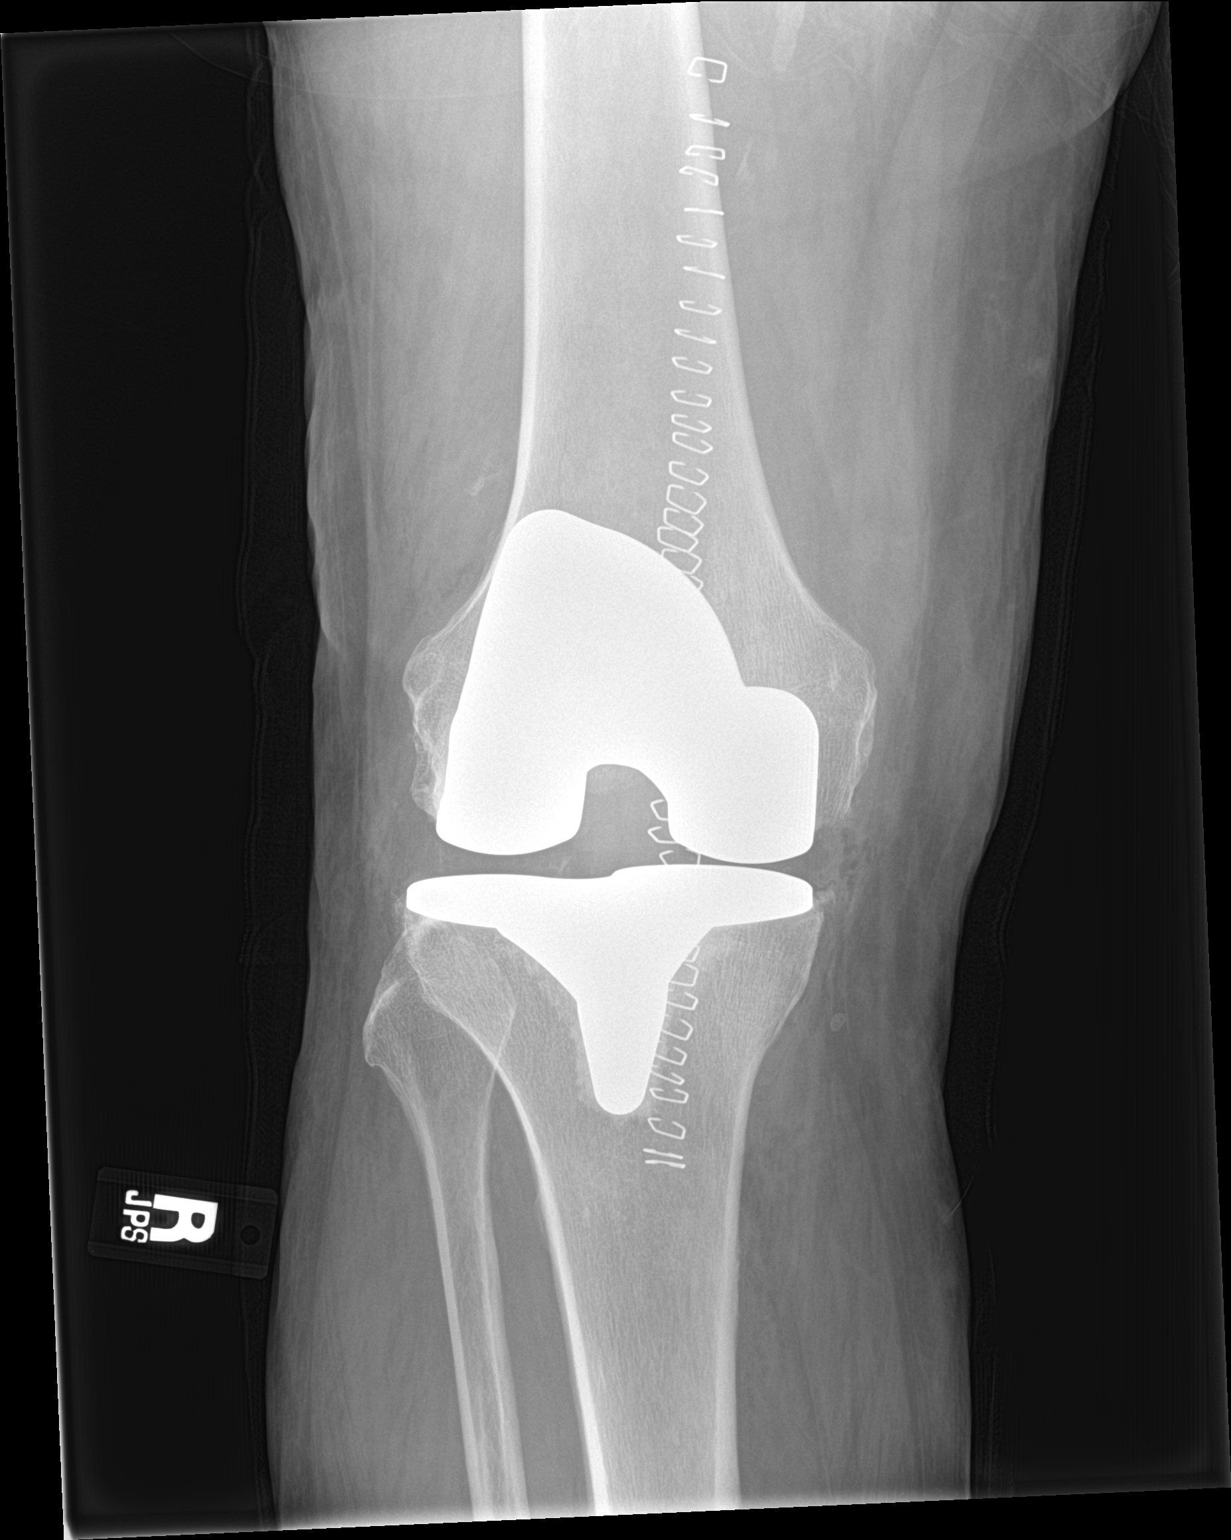
[im 2/2]
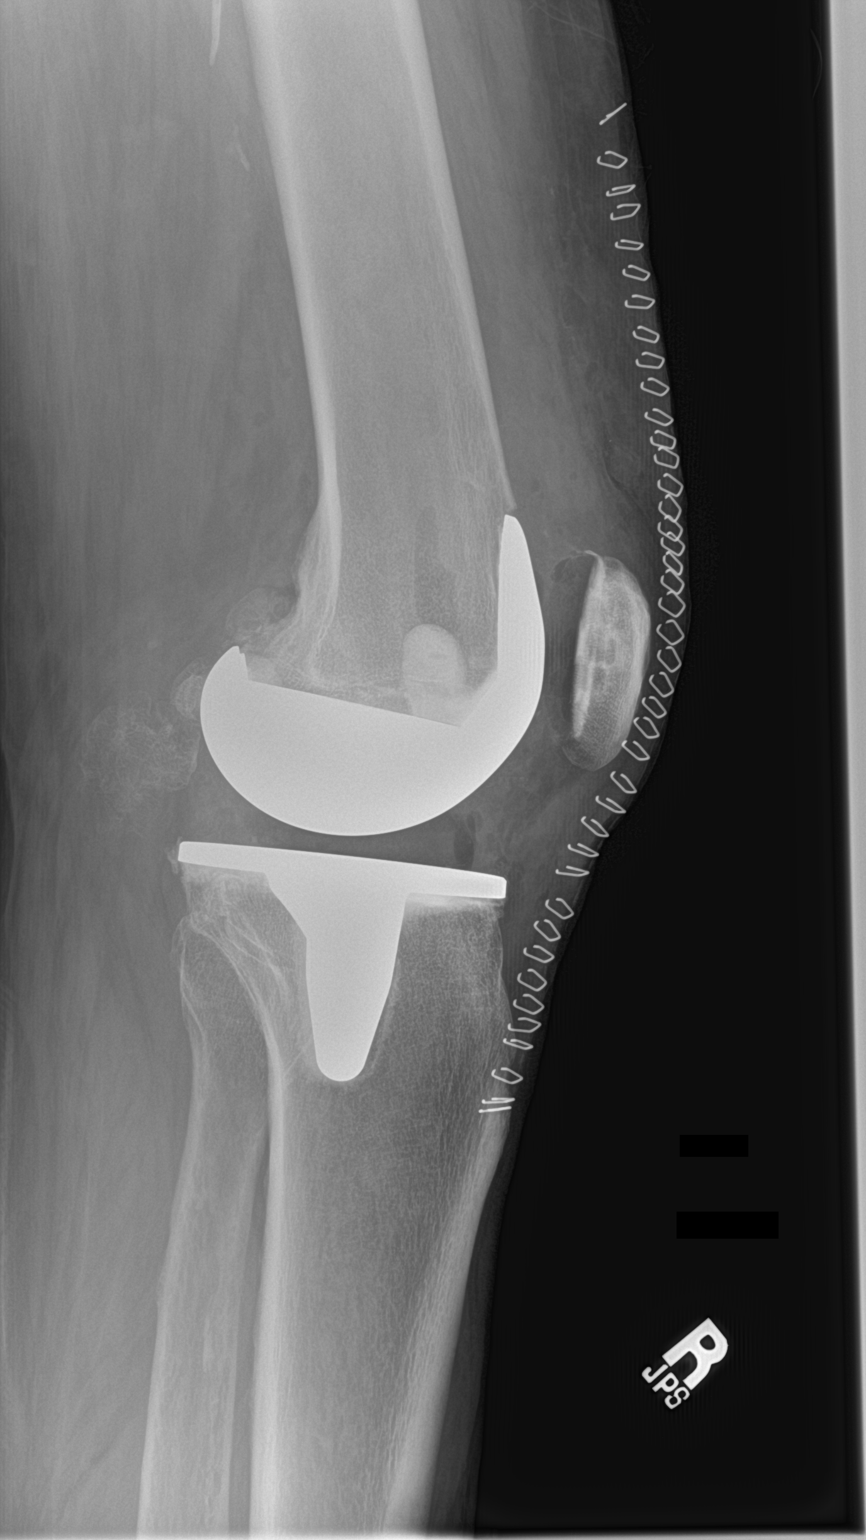

[2 of 2 positions shown; findings below may reference images not displayed]

FINDINGS: Right total knee replacement. Components appear well positioned. No
radiographically detectable complication. Small amount of joint
fluid as expected. Question possibility of calcified loose bodies in
a Baker's cyst.
IMPRESSION: Total knee replacement as above. Question calcified loose bodies in
a Baker cyst.

## 2021-03-02 DIAGNOSIS — M436 Torticollis: Secondary | ICD-10-CM | POA: Diagnosis not present

## 2021-03-07 IMAGING — US US EXTREM LOW VENOUS*R*
1 series · 13 of 24 positions shown · non-contrast
Comparison: 10/02/2018

CLINICAL DATA: Right lower extremity pain and edema. Status post
recent right knee arthroplasty.



[Series 1: us extrem low venous*right* · 13 of 36 slices shown]
[im 1/36]
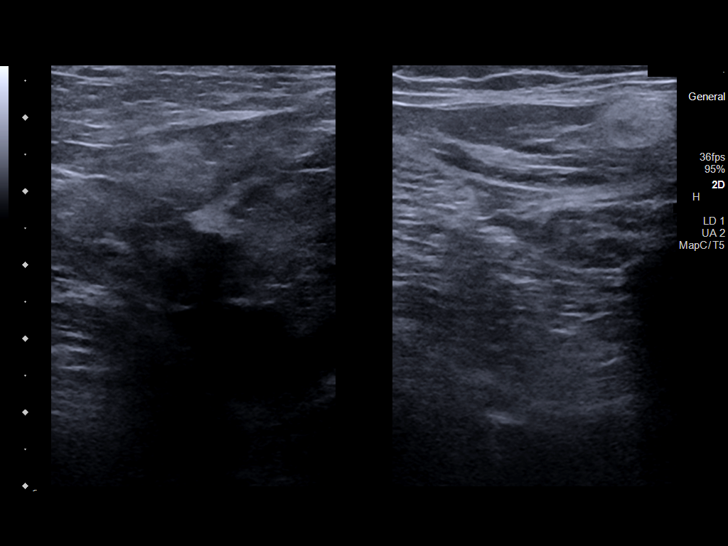
[im 4/36]
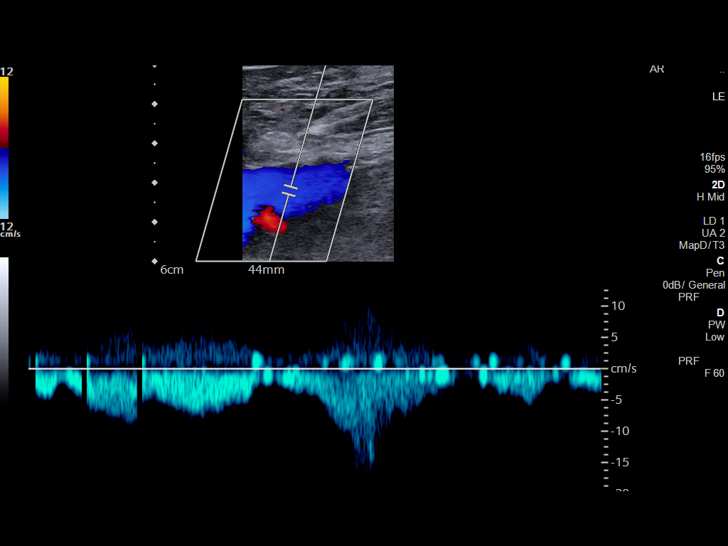
[im 7/36]
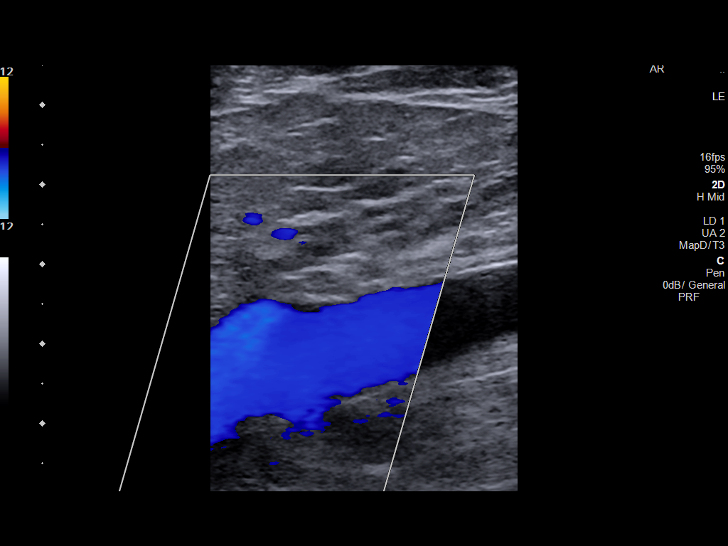
[im 10/36]
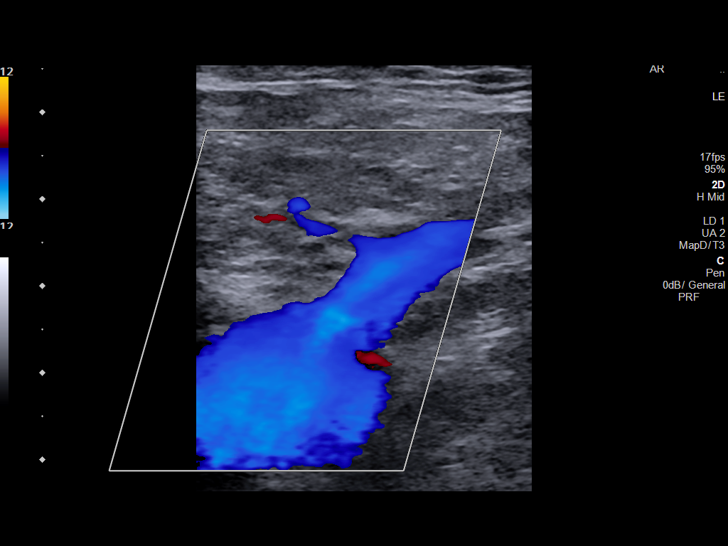
[im 13/36]
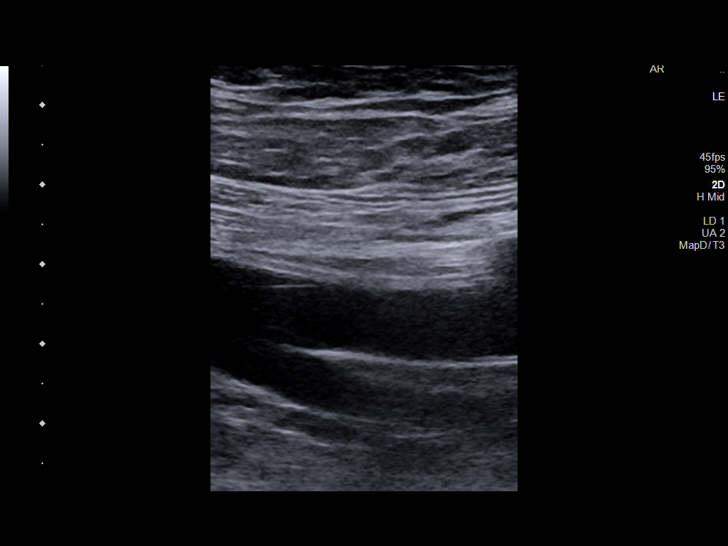
[im 16/36]
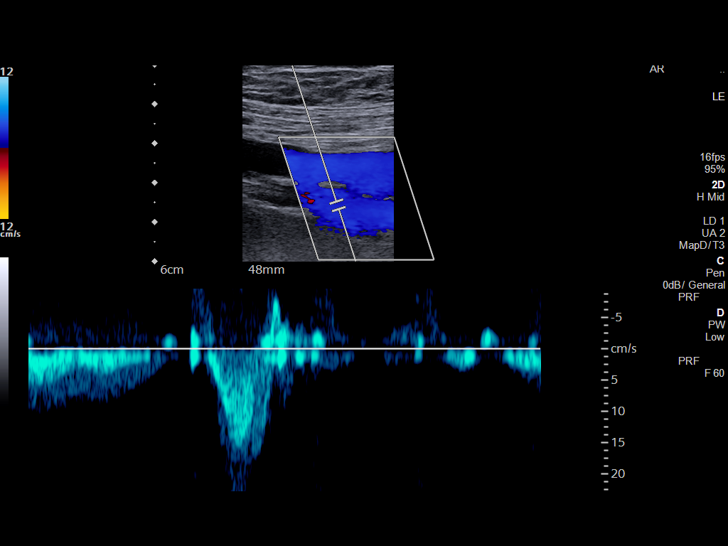
[im 19/36]
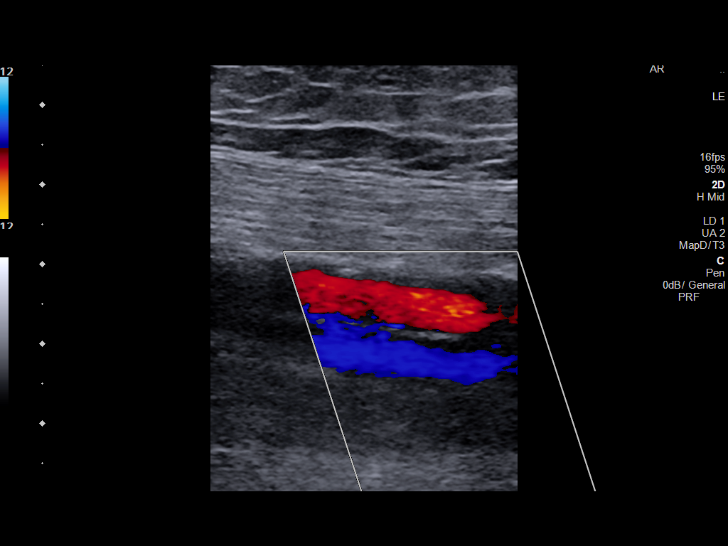
[im 20/36]
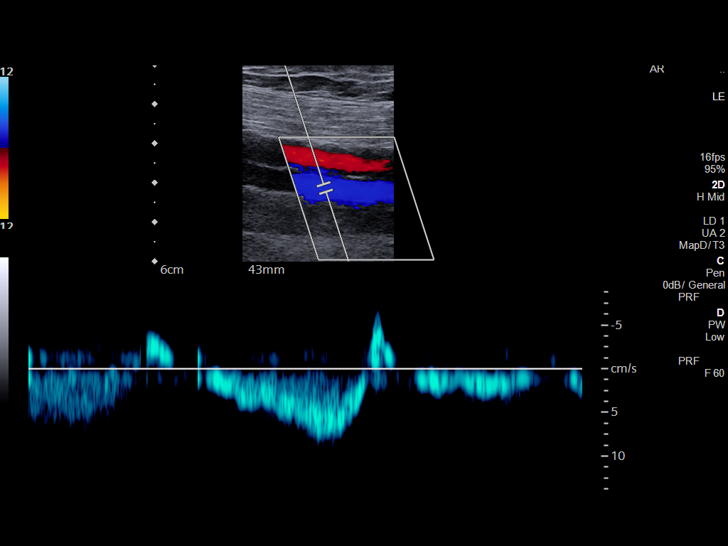
[im 23/36]
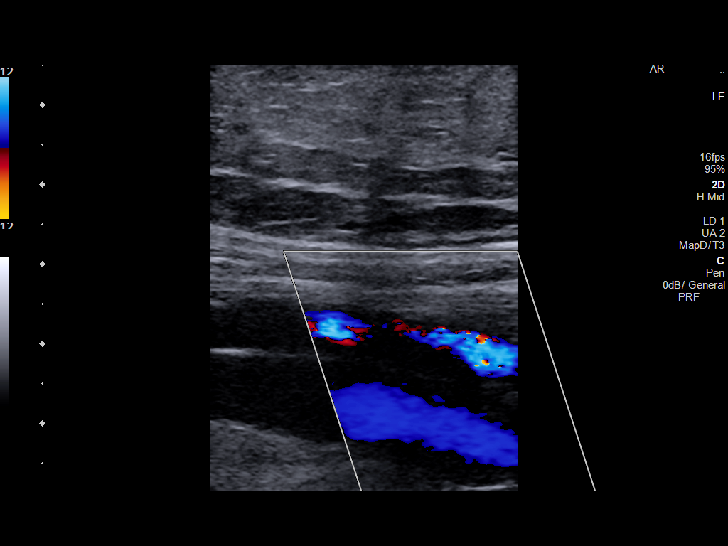
[im 26/36]
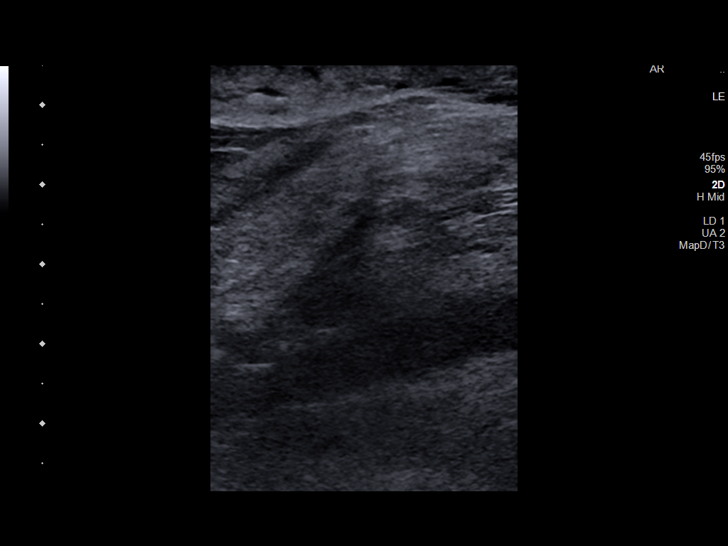
[im 29/36]
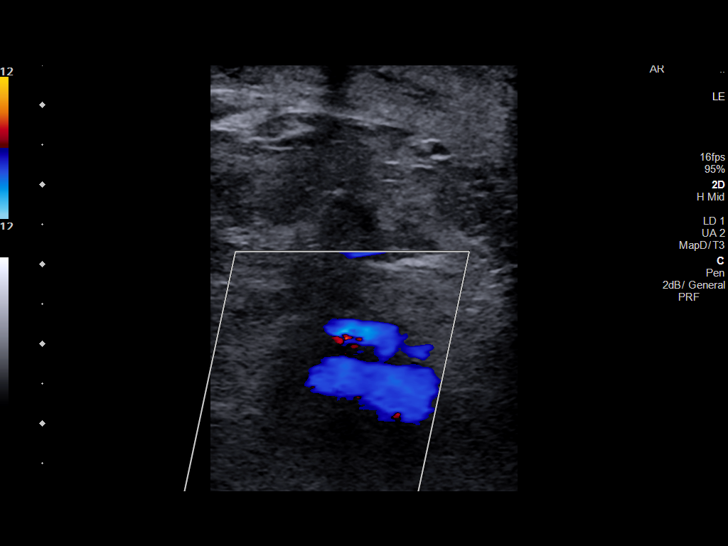
[im 32/36]
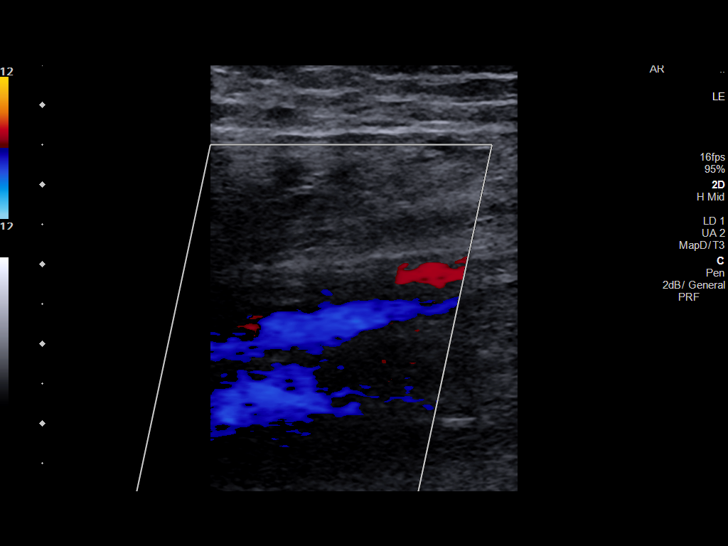
[im 36/36]
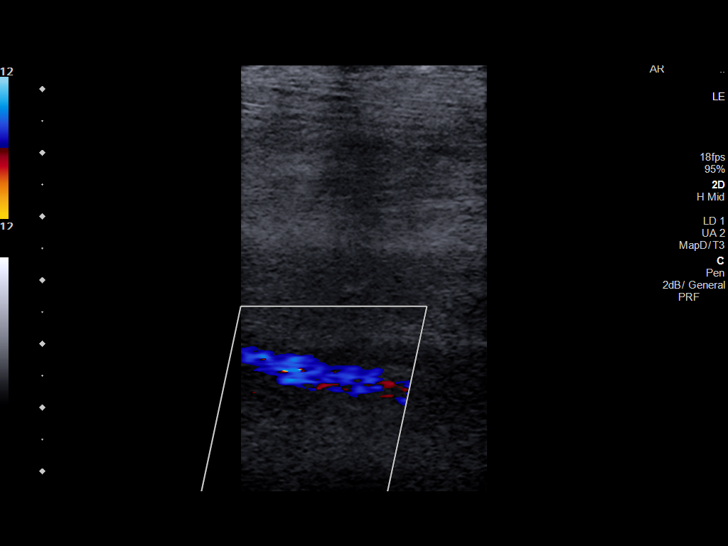

[13 of 24 positions shown; findings below may reference images not displayed]

FINDINGS: Contralateral Common Femoral Vein: Respiratory phasicity is normal
and symmetric with the symptomatic side. No evidence of thrombus.
Normal compressibility.

Common Femoral Vein: No evidence of thrombus. Normal
compressibility, respiratory phasicity and response to augmentation.

Saphenofemoral Junction: No evidence of thrombus. Normal
compressibility and flow on color Doppler imaging.

Profunda Femoral Vein: No evidence of thrombus. Normal
compressibility and flow on color Doppler imaging.

Femoral Vein: No evidence of thrombus. Normal compressibility,
respiratory phasicity and response to augmentation.

Popliteal Vein: No evidence of thrombus. Normal compressibility,
respiratory phasicity and response to augmentation.

Calf Veins: No evidence of thrombus. Normal compressibility and flow
on color Doppler imaging.

Superficial Great Saphenous Vein: No evidence of thrombus. Normal
compressibility.

Venous Reflux:  None.

Other Findings: No evidence of superficial thrombophlebitis or
abnormal fluid collection.
IMPRESSION: No evidence of right lower extremity deep venous thrombosis.

## 2021-03-22 ENCOUNTER — Other Ambulatory Visit: Payer: Self-pay | Admitting: Family Medicine

## 2021-04-02 ENCOUNTER — Other Ambulatory Visit: Payer: Self-pay | Admitting: Family Medicine

## 2021-04-06 ENCOUNTER — Ambulatory Visit (INDEPENDENT_AMBULATORY_CARE_PROVIDER_SITE_OTHER): Payer: Medicare Other | Admitting: Family Medicine

## 2021-04-06 ENCOUNTER — Encounter: Payer: Self-pay | Admitting: Family Medicine

## 2021-04-06 VITALS — BP 112/58 | HR 58 | Temp 97.7°F | Ht 71.0 in | Wt 214.4 lb

## 2021-04-06 DIAGNOSIS — E785 Hyperlipidemia, unspecified: Secondary | ICD-10-CM | POA: Diagnosis not present

## 2021-04-06 DIAGNOSIS — E1140 Type 2 diabetes mellitus with diabetic neuropathy, unspecified: Secondary | ICD-10-CM | POA: Diagnosis not present

## 2021-04-06 DIAGNOSIS — I10 Essential (primary) hypertension: Secondary | ICD-10-CM | POA: Diagnosis not present

## 2021-04-06 LAB — CBC WITH DIFFERENTIAL/PLATELET
Basophils Absolute: 0 10*3/uL (ref 0.0–0.2)
Basos: 1 %
EOS (ABSOLUTE): 0.2 10*3/uL (ref 0.0–0.4)
Eos: 3 %
Hematocrit: 42.9 % (ref 37.5–51.0)
Hemoglobin: 13.9 g/dL (ref 13.0–17.7)
Immature Grans (Abs): 0 10*3/uL (ref 0.0–0.1)
Immature Granulocytes: 0 %
Lymphocytes Absolute: 1.5 10*3/uL (ref 0.7–3.1)
Lymphs: 26 %
MCH: 30.8 pg (ref 26.6–33.0)
MCHC: 32.4 g/dL (ref 31.5–35.7)
MCV: 95 fL (ref 79–97)
Monocytes Absolute: 0.6 10*3/uL (ref 0.1–0.9)
Monocytes: 10 %
Neutrophils Absolute: 3.5 10*3/uL (ref 1.4–7.0)
Neutrophils: 60 %
Platelets: 245 10*3/uL (ref 150–450)
RBC: 4.51 x10E6/uL (ref 4.14–5.80)
RDW: 13.5 % (ref 11.6–15.4)
WBC: 5.8 10*3/uL (ref 3.4–10.8)

## 2021-04-06 LAB — LIPID PANEL
Chol/HDL Ratio: 2.1 ratio (ref 0.0–5.0)
Cholesterol, Total: 103 mg/dL (ref 100–199)
HDL: 50 mg/dL (ref >39)
LDL Chol Calc (NIH): 37 mg/dL (ref 0–99)
Triglycerides: 80 mg/dL (ref 0–149)
VLDL Cholesterol Cal: 16 mg/dL (ref 5–40)

## 2021-04-06 LAB — CMP14+EGFR
ALT: 27 IU/L (ref 0–44)
AST: 17 IU/L (ref 0–40)
Albumin/Globulin Ratio: 1.9 (ref 1.2–2.2)
Albumin: 4.5 g/dL (ref 3.6–4.6)
Alkaline Phosphatase: 100 IU/L (ref 44–121)
BUN/Creatinine Ratio: 15 (ref 10–24)
BUN: 11 mg/dL (ref 8–27)
Bilirubin Total: 0.9 mg/dL (ref 0.0–1.2)
CO2: 27 mmol/L (ref 20–29)
Calcium: 9.1 mg/dL (ref 8.6–10.2)
Chloride: 101 mmol/L (ref 96–106)
Creatinine, Ser: 0.74 mg/dL — ABNORMAL LOW (ref 0.76–1.27)
Globulin, Total: 2.4 g/dL (ref 1.5–4.5)
Glucose: 120 mg/dL — ABNORMAL HIGH (ref 70–99)
Potassium: 4.5 mmol/L (ref 3.5–5.2)
Sodium: 141 mmol/L (ref 134–144)
Total Protein: 6.9 g/dL (ref 6.0–8.5)
eGFR: 90 mL/min/{1.73_m2} (ref >59)

## 2021-04-06 LAB — BAYER DCA HB A1C WAIVED: HB A1C (BAYER DCA - WAIVED): 6.2 % — ABNORMAL HIGH (ref 4.8–5.6)

## 2021-04-06 MED ORDER — LISINOPRIL 20 MG PO TABS: ORAL_TABLET | ORAL | 2 refills | Status: DC

## 2021-04-06 NOTE — Progress Notes (Signed)
Subjective:  Patient ID: [name],  male    DOB: [birth year]  Age: 86 y.o.    CC: Medical Management of Chronic Issues   HPI OTIS PORTAL presents for  follow-up of hypertension.   Losing weight with weight watchers. Lost 21 lb. Going to the meetings weekly. Has eliminated fast foods. Still some sweets  Patient has no history of headache chest pain or shortness of breath or recent cough. Patient also denies symptoms of TIA such as numbness weakness lateralizing. Patient denies side effects from medication. States taking it regularly.  Patient also  in for follow-up of elevated cholesterol. Doing well without complaints on current medication. Denies side effects  including myalgia and arthralgia and nausea. Also in today for liver function testing. Currently no chest pain, shortness of breath or other cardiovascular related symptoms noted.  Follow-up of diabetes.Patient denies symptoms such as excessive hunger or urinary frequency, excessive hunger, nausea No significant hypoglycemic spells noted. Medications reviewed. Pt reports taking them regularly. Pt. denies complication/adverse reaction today.    History Harper has a past medical history of Arthritis, Atrial fibrillation ([place]), BPH (benign prostatic hyperplasia), Cataracts, bilateral, Coronary artery disease excluded, Diabetes mellitus without complication ([place]), Diverticulosis, Endocarditis, valve unspecified, unspecified cause, GERD (gastroesophageal reflux disease), H/O mitral valve repair, Hearing loss, Heart murmur, Hiatal hernia (06/13/2002), High cholesterol, History of kidney stones, OSA on CPAP, Stroke ([place]), Unspecified essential hypertension, Weakness of both arms (07/19/2012), and Wears dentures.   He has a past surgical history that includes Vasectomy; Mitral valve repair (~ 2003); Cataract extraction w/PHACO (Right, 05/12/2015); Cataract extraction w/PHACO (Left, 06/12/2015); Total knee arthroplasty (Left,  02/20/2016); Injection knee (Right, 02/20/2016); Cardioversion (N/A, 04/14/2016); Laparoscopic cholecystectomy; Knee arthroscopy (Left); Joint replacement; Shoulder open rotator cuff repair (Left); Colonoscopy; Upper gi endoscopy; Multiple tooth extractions; Cardiac catheterization (02/14/2007); Shoulder arthroscopy with rotator cuff repair and open biceps tenodesis (Right, 01/26/2019); Eye surgery; and Total knee arthroplasty (Right, 07/06/2019).   His family history includes Congestive Heart Failure in his father; Diabetes Mellitus II in his brother and sister; Heart disease in his father; Lung cancer in his brother; Stroke in his brother, brother, sister, and sister.He reports that he quit smoking about 33 years ago. His smoking use included cigarettes. He has a 105.00 pack-year smoking history. He has never used smokeless tobacco. He reports that he does not drink alcohol and does not use drugs.  Current Outpatient Medications on File Prior to Visit  Medication Sig Dispense Refill   amLODipine (NORVASC) 5 MG tablet Take 1 tablet (5 mg total) by mouth daily. 90 tablet 1   apixaban (ELIQUIS) 5 MG TABS tablet Take 1 tablet (5 mg total) by mouth 2 (two) times daily. 180 tablet 1   Calcium Carb-Cholecalciferol (CALCIUM 600 + D PO) Take 1 tablet by mouth daily.     Coenzyme Q10 300 MG CAPS Take 300 mg by mouth daily.     meclizine (ANTIVERT) 25 MG tablet Take 25 mg by mouth 3 (three) times daily as needed.     metFORMIN (GLUCOPHAGE-XR) 500 MG 24 hr tablet TAKE 1 TABLET BY MOUTH EVERY DAY WITH BREAKFAST 90 tablet 1   Multiple Vitamin (MULTIVITAMIN WITH MINERALS) TABS tablet Take 1 tablet by mouth daily.     omeprazole (PRILOSEC) 40 MG capsule Take 1 capsule (40 mg total) by mouth daily. 90 capsule 1   rosuvastatin (CRESTOR) 5 MG tablet Take 1 tablet (5 mg total) by mouth daily. For cholesterol 90 tablet 1  silodosin (RAPAFLO) 4 MG CAPS capsule Take 1 capsule (4 mg total) by mouth daily with breakfast. 90  capsule 3   No current facility-administered medications on file prior to visit.    ROS Review of Systems  Constitutional:  Negative for fever.  Respiratory:  Negative for shortness of breath.   Cardiovascular:  Negative for chest pain.  Musculoskeletal:  Negative for arthralgias.  Skin:  Negative for rash.   Objective:  BP (!) 112/58    Pulse (!) 58    Temp 97.7 F (36.5 C)    Ht 5' 11" (1.803 m)    Wt 214 lb 6.4 oz (97.3 kg)    SpO2 100%    BMI 29.90 kg/m   BP Readings from Last 3 Encounters:  04/06/21 (!) 112/58  01/01/21 131/77  12/01/20 108/60    Wt Readings from Last 3 Encounters:  04/06/21 214 lb 6.4 oz (97.3 kg)  01/01/21 235 lb (106.6 kg)  12/01/20 234 lb 12.8 oz (106.5 kg)     Physical Exam Vitals reviewed.  Constitutional:      Appearance: He is well-developed.  HENT:     Head: Normocephalic and atraumatic.     Right Ear: External ear normal.     Left Ear: External ear normal.     Mouth/Throat:     Pharynx: No oropharyngeal exudate or posterior oropharyngeal erythema.  Eyes:     Pupils: Pupils are equal, round, and reactive to light.  Cardiovascular:     Rate and Rhythm: Normal rate and regular rhythm.     Heart sounds: No murmur heard. Pulmonary:     Effort: No respiratory distress.     Breath sounds: Normal breath sounds.  Musculoskeletal:     Cervical back: Normal range of motion and neck supple.  Neurological:     Mental Status: He is alert and oriented to person, place, and time.    Diabetic Foot Exam - Simple   No data filed       Assessment & Plan:   Mcdonald was seen today for medical management of chronic issues.  Diagnoses and all orders for this visit:  Type 2 diabetes mellitus with diabetic neuropathy, unspecified whether long term insulin use ([place]) -     Bayer DCA Hb A1c Waived  Essential hypertension -     CBC with Differential/Platelet -     CMP14+EGFR  Dyslipidemia -     Lipid panel  Other orders -     lisinopril  (ZESTRIL) 20 MG tablet; TAKE 1 TABLET BY MOUTH EVERYDAY AT BEDTIME   I am having Vontae A. Rowe Robert maintain his multivitamin with minerals, Calcium Carb-Cholecalciferol (CALCIUM 600 + D PO), Coenzyme Q10, meclizine, silodosin, rosuvastatin, omeprazole, metFORMIN, apixaban, amLODipine, and lisinopril.  Meds ordered this encounter  Medications   lisinopril (ZESTRIL) 20 MG tablet    Sig: TAKE 1 TABLET BY MOUTH EVERYDAY AT BEDTIME    Dispense:  90 tablet    Refill:  2     Follow-up: Return in about 3 months (around 07/04/2021).  Claretta Fraise, M.D.

## 2021-04-07 NOTE — Progress Notes (Signed)
Hello Zyen,  Your lab result is normal and/or stable.Some minor variations that are not significant are commonly marked abnormal, but do not represent any medical problem for you.  Best regards, Karina Nofsinger, M.D.

## 2021-05-14 DIAGNOSIS — L820 Inflamed seborrheic keratosis: Secondary | ICD-10-CM | POA: Diagnosis not present

## 2021-05-14 DIAGNOSIS — X32XXXD Exposure to sunlight, subsequent encounter: Secondary | ICD-10-CM | POA: Diagnosis not present

## 2021-05-14 DIAGNOSIS — L570 Actinic keratosis: Secondary | ICD-10-CM | POA: Diagnosis not present

## 2021-05-16 DIAGNOSIS — R109 Unspecified abdominal pain: Secondary | ICD-10-CM | POA: Diagnosis not present

## 2021-05-16 DIAGNOSIS — R509 Fever, unspecified: Secondary | ICD-10-CM | POA: Diagnosis not present

## 2021-05-16 DIAGNOSIS — R7401 Elevation of levels of liver transaminase levels: Secondary | ICD-10-CM | POA: Diagnosis not present

## 2021-05-16 DIAGNOSIS — R1013 Epigastric pain: Secondary | ICD-10-CM | POA: Diagnosis not present

## 2021-05-16 DIAGNOSIS — R269 Unspecified abnormalities of gait and mobility: Secondary | ICD-10-CM | POA: Diagnosis not present

## 2021-05-16 DIAGNOSIS — R824 Acetonuria: Secondary | ICD-10-CM | POA: Diagnosis not present

## 2021-05-16 DIAGNOSIS — R319 Hematuria, unspecified: Secondary | ICD-10-CM | POA: Diagnosis not present

## 2021-05-18 ENCOUNTER — Ambulatory Visit (HOSPITAL_COMMUNITY)
Admission: RE | Admit: 2021-05-18 | Discharge: 2021-05-18 | Disposition: A | Discharge status: Home / Self Care | Payer: Medicare Other | Source: Ambulatory Visit | Attending: Family Medicine | Admitting: Family Medicine

## 2021-05-18 ENCOUNTER — Ambulatory Visit (INDEPENDENT_AMBULATORY_CARE_PROVIDER_SITE_OTHER): Payer: Medicare Other | Admitting: Family Medicine

## 2021-05-18 ENCOUNTER — Encounter: Payer: Self-pay | Admitting: Family Medicine

## 2021-05-18 VITALS — BP 124/61 | HR 62 | Temp 97.3°F | Ht 71.0 in | Wt 211.4 lb

## 2021-05-18 DIAGNOSIS — Z743 Need for continuous supervision: Secondary | ICD-10-CM | POA: Diagnosis not present

## 2021-05-18 DIAGNOSIS — Z79899 Other long term (current) drug therapy: Secondary | ICD-10-CM | POA: Diagnosis not present

## 2021-05-18 DIAGNOSIS — N281 Cyst of kidney, acquired: Secondary | ICD-10-CM | POA: Diagnosis not present

## 2021-05-18 DIAGNOSIS — Z7901 Long term (current) use of anticoagulants: Secondary | ICD-10-CM | POA: Diagnosis not present

## 2021-05-18 DIAGNOSIS — G4733 Obstructive sleep apnea (adult) (pediatric): Secondary | ICD-10-CM | POA: Diagnosis not present

## 2021-05-18 DIAGNOSIS — R1013 Epigastric pain: Secondary | ICD-10-CM | POA: Diagnosis not present

## 2021-05-18 DIAGNOSIS — K2289 Other specified disease of esophagus: Secondary | ICD-10-CM | POA: Diagnosis not present

## 2021-05-18 DIAGNOSIS — Z87891 Personal history of nicotine dependence: Secondary | ICD-10-CM | POA: Diagnosis not present

## 2021-05-18 DIAGNOSIS — K8051 Calculus of bile duct without cholangitis or cholecystitis with obstruction: Secondary | ICD-10-CM | POA: Diagnosis not present

## 2021-05-18 DIAGNOSIS — Z9841 Cataract extraction status, right eye: Secondary | ICD-10-CM | POA: Diagnosis not present

## 2021-05-18 DIAGNOSIS — I2510 Atherosclerotic heart disease of native coronary artery without angina pectoris: Secondary | ICD-10-CM | POA: Diagnosis not present

## 2021-05-18 DIAGNOSIS — R7989 Other specified abnormal findings of blood chemistry: Secondary | ICD-10-CM | POA: Insufficient documentation

## 2021-05-18 DIAGNOSIS — Z8249 Family history of ischemic heart disease and other diseases of the circulatory system: Secondary | ICD-10-CM | POA: Diagnosis not present

## 2021-05-18 DIAGNOSIS — Z823 Family history of stroke: Secondary | ICD-10-CM | POA: Diagnosis not present

## 2021-05-18 DIAGNOSIS — E1140 Type 2 diabetes mellitus with diabetic neuropathy, unspecified: Secondary | ICD-10-CM | POA: Diagnosis not present

## 2021-05-18 DIAGNOSIS — Z87442 Personal history of urinary calculi: Secondary | ICD-10-CM | POA: Diagnosis not present

## 2021-05-18 DIAGNOSIS — Z96653 Presence of artificial knee joint, bilateral: Secondary | ICD-10-CM | POA: Diagnosis not present

## 2021-05-18 DIAGNOSIS — K439 Ventral hernia without obstruction or gangrene: Secondary | ICD-10-CM | POA: Diagnosis not present

## 2021-05-18 DIAGNOSIS — Z961 Presence of intraocular lens: Secondary | ICD-10-CM | POA: Diagnosis not present

## 2021-05-18 DIAGNOSIS — K8050 Calculus of bile duct without cholangitis or cholecystitis without obstruction: Secondary | ICD-10-CM

## 2021-05-18 DIAGNOSIS — K219 Gastro-esophageal reflux disease without esophagitis: Secondary | ICD-10-CM | POA: Diagnosis not present

## 2021-05-18 DIAGNOSIS — I482 Chronic atrial fibrillation, unspecified: Secondary | ICD-10-CM | POA: Diagnosis not present

## 2021-05-18 DIAGNOSIS — E7800 Pure hypercholesterolemia, unspecified: Secondary | ICD-10-CM | POA: Diagnosis not present

## 2021-05-18 DIAGNOSIS — Z833 Family history of diabetes mellitus: Secondary | ICD-10-CM | POA: Diagnosis not present

## 2021-05-18 DIAGNOSIS — Z9842 Cataract extraction status, left eye: Secondary | ICD-10-CM | POA: Diagnosis not present

## 2021-05-18 DIAGNOSIS — R9431 Abnormal electrocardiogram [ECG] [EKG]: Secondary | ICD-10-CM | POA: Diagnosis not present

## 2021-05-18 DIAGNOSIS — R6889 Other general symptoms and signs: Secondary | ICD-10-CM | POA: Diagnosis not present

## 2021-05-18 DIAGNOSIS — R945 Abnormal results of liver function studies: Secondary | ICD-10-CM | POA: Diagnosis not present

## 2021-05-18 DIAGNOSIS — I10 Essential (primary) hypertension: Secondary | ICD-10-CM | POA: Diagnosis not present

## 2021-05-18 DIAGNOSIS — R1314 Dysphagia, pharyngoesophageal phase: Secondary | ICD-10-CM | POA: Diagnosis not present

## 2021-05-18 DIAGNOSIS — I4891 Unspecified atrial fibrillation: Secondary | ICD-10-CM | POA: Diagnosis not present

## 2021-05-18 DIAGNOSIS — R0902 Hypoxemia: Secondary | ICD-10-CM | POA: Diagnosis not present

## 2021-05-18 DIAGNOSIS — R109 Unspecified abdominal pain: Secondary | ICD-10-CM | POA: Diagnosis not present

## 2021-05-18 DIAGNOSIS — Z7984 Long term (current) use of oral hypoglycemic drugs: Secondary | ICD-10-CM | POA: Diagnosis not present

## 2021-05-18 DIAGNOSIS — Z9049 Acquired absence of other specified parts of digestive tract: Secondary | ICD-10-CM | POA: Diagnosis not present

## 2021-05-18 DIAGNOSIS — Z8673 Personal history of transient ischemic attack (TIA), and cerebral infarction without residual deficits: Secondary | ICD-10-CM | POA: Diagnosis not present

## 2021-05-18 DIAGNOSIS — Z888 Allergy status to other drugs, medicaments and biological substances status: Secondary | ICD-10-CM | POA: Diagnosis not present

## 2021-05-18 DIAGNOSIS — H9193 Unspecified hearing loss, bilateral: Secondary | ICD-10-CM | POA: Diagnosis not present

## 2021-05-18 DIAGNOSIS — R748 Abnormal levels of other serum enzymes: Secondary | ICD-10-CM | POA: Diagnosis not present

## 2021-05-18 DIAGNOSIS — R112 Nausea with vomiting, unspecified: Secondary | ICD-10-CM | POA: Diagnosis not present

## 2021-05-18 NOTE — Progress Notes (Signed)
? ?Subjective:  ?Patient ID: [name], male    DOB: Feb 09, 1936  Age: 86 y.o. MRN: [id number] ? ?CC: No chief complaint on file. ? ? ?HPI ?[name] presents for Awoke with  abdominal pain 3 AM 2 days ago. Dull epigastric pain. Temporary relief with GasEX.  Later in the day of onset he went to the emergency department at [facility].  Multiple tests were run.  CT scan showed 2 small places in the liver that were 1 a hematoma and the other 1 that was shrinking since his last evaluation.  His biliary duct was somewhat dilated but this was felt to be age-related as opposed to pathologic.  Had abn. LFT.  Bilirubin was 2.3 and transaminases were 5-10 times normal.  Epigastric location without radiation was 5/10. Now has remitted.  He had some nausea early on but that has resolved.  He has had no diarrhea or constipation.  Patient has had his gallbladder removed in the past. Hepatitis C test drawn at E.D. is pending. ? ? ?  05/18/2021  ?  9:07 AM 04/06/2021  ?  8:53 AM 02/12/2021  ?  2:37 PM  ?Depression screen PHQ 2/9  ?Decreased Interest 0 0 0  ?Down, Depressed, Hopeless 0 0 0  ?PHQ - 2 Score 0 0 0  ? ? ?History ?[name] has a past medical history of Arthritis, Atrial fibrillation ([place]), BPH (benign prostatic hyperplasia), Cataracts, bilateral, Coronary artery disease excluded, Diabetes mellitus without complication ([place]), Diverticulosis, Endocarditis, valve unspecified, unspecified cause, GERD (gastroesophageal reflux disease), H/O mitral valve repair, Hearing loss, Heart murmur, Hiatal hernia (06/13/2002), High cholesterol, History of kidney stones, OSA on CPAP, Stroke ([place]), Unspecified essential hypertension, Weakness of both arms (07/19/2012), and Wears dentures.  ? ?He has a past surgical history that includes Vasectomy; Mitral valve repair (~ 2003); Cataract extraction w/PHACO (Right, 05/12/2015); Cataract extraction w/PHACO (Left, 06/12/2015); Total knee arthroplasty (Left, 02/20/2016); Injection knee  (Right, 02/20/2016); Cardioversion (N/A, 04/14/2016); Laparoscopic cholecystectomy; Knee arthroscopy (Left); Joint replacement; Shoulder open rotator cuff repair (Left); Colonoscopy; Upper gi endoscopy; Multiple tooth extractions; Cardiac catheterization (02/14/2007); Shoulder arthroscopy with rotator cuff repair and open biceps tenodesis (Right, 01/26/2019); Eye surgery; and Total knee arthroplasty (Right, 07/06/2019).  ? ?His family history includes Congestive Heart Failure in his father; Diabetes Mellitus II in his brother and sister; Heart disease in his father; Lung cancer in his brother; Stroke in his brother, brother, sister, and sister.He reports that he quit smoking about 33 years ago. His smoking use included cigarettes. He has a 105.00 pack-year smoking history. He has never used smokeless tobacco. He reports that he does not drink alcohol and does not use drugs. ? ? ? ?ROS ?Review of Systems  ?Constitutional:  Negative for chills, diaphoresis, fever and unexpected weight change.  ?HENT:  Negative for rhinorrhea and trouble swallowing.   ?Respiratory:  Negative for cough, chest tightness and shortness of breath.   ?Cardiovascular:  Negative for chest pain.  ?Gastrointestinal:  Positive for abdominal distention and abdominal pain. Negative for blood in stool, constipation, diarrhea, rectal pain and vomiting.  ?Genitourinary:  Positive for hematuria (noted at ED). Negative for dysuria and flank pain.  ?Musculoskeletal: Negative.   ?Skin:  Negative for rash.  ?Neurological:  Negative for syncope and headaches.  ? ?Objective:  ?BP 124/61   Pulse 62   Temp (!) 97.3 ?F (36.3 ?C)   Ht '5\' 11"'  (1.803 m)   Wt 211 lb 6.4 oz (95.9 kg)   SpO2 99%  BMI 29.48 kg/m?  ? ?BP Readings from Last 3 Encounters:  ?05/18/21 124/61  ?04/06/21 (!) 112/58  ?01/01/21 131/77  ? ? ?Wt Readings from Last 3 Encounters:  ?05/18/21 211 lb 6.4 oz (95.9 kg)  ?04/06/21 214 lb 6.4 oz (97.3 kg)  ?01/01/21 235 lb (106.6 kg)  ? ? ? ?Physical  Exam ?Vitals reviewed.  ?Constitutional:   ?   Appearance: He is well-developed.  ?HENT:  ?   Head: Normocephalic and atraumatic.  ?   Right Ear: External ear normal.  ?   Left Ear: External ear normal.  ?   Mouth/Throat:  ?   Pharynx: No oropharyngeal exudate or posterior oropharyngeal erythema.  ?Eyes:  ?   Pupils: Pupils are equal, round, and reactive to light.  ?Cardiovascular:  ?   Rate and Rhythm: Normal rate and regular rhythm.  ?   Heart sounds: No murmur heard. ?Pulmonary:  ?   Effort: No respiratory distress.  ?   Breath sounds: Normal breath sounds.  ?Abdominal:  ?   General: There is distension.  ?   Tenderness: There is abdominal tenderness (epigatrum to periumbilical region). There is no guarding or rebound.  ?   Hernia: A hernia (midline ventral from sternum to umbilicus) is present.  ?Musculoskeletal:  ?   Cervical back: Normal range of motion and neck supple.  ?Skin: ?   General: Skin is warm.  ?   Coloration: Skin is not jaundiced ((in spite of previously elevated bilirubin.).  ?Neurological:  ?   Mental Status: He is alert and oriented to person, place, and time.  ? ? ? ? ?Assessment & Plan:  ? ?Diagnoses and all orders for this visit: ? ?Common bile duct stone ? ?Abnormal LFTs ?-     US Abdomen Complete; Future ?-     CMP14+EGFR ?-     CBC with Differential/Platelet ?-     Lipase ? ?Epigastric pain ?-     US Abdomen Complete; Future ?-     CMP14+EGFR ?-     CBC with Differential/Platelet ?-     Lipase ? ?Ventral hernia without obstruction or gangrene ? ? ? ? ? ? ?I am having [name] maintain his multivitamin with minerals, Calcium Carb-Cholecalciferol (CALCIUM 600 + D PO), Coenzyme Q10, meclizine, silodosin, rosuvastatin, omeprazole, metFORMIN, apixaban, amLODipine, and lisinopril. ? ?Allergies as of 05/18/2021   ? ?   Reactions  ? Flomax [tamsulosin Hcl] Other (See Comments)  ? Makes him "swimmy headed"  ? ?  ? ?  ?Medication List  ?  ? ?  ? Accurate as of May 18, 2021  9:59 AM. If you  have any questions, ask your nurse or doctor.  ?  ?  ? ?  ? ?amLODipine 5 MG tablet ?Commonly known as: NORVASC ?Take 1 tablet (5 mg total) by mouth daily. ?  ?apixaban 5 MG Tabs tablet ?Commonly known as: Eliquis ?Take 1 tablet (5 mg total) by mouth 2 (two) times daily. ?  ?CALCIUM 600 + D PO ?Take 1 tablet by mouth daily. ?  ?Coenzyme Q10 300 MG Caps ?Take 300 mg by mouth daily. ?  ?lisinopril 20 MG tablet ?Commonly known as: ZESTRIL ?TAKE 1 TABLET BY MOUTH EVERYDAY AT BEDTIME ?  ?meclizine 25 MG tablet ?Commonly known as: ANTIVERT ?Take 25 mg by mouth 3 (three) times daily as needed. ?  ?metFORMIN 500 MG 24 hr tablet ?Commonly known as: GLUCOPHAGE-XR ?TAKE 1 TABLET BY MOUTH EVERY DAY WITH BREAKFAST ?  ?  multivitamin with minerals Tabs tablet ?Take 1 tablet by mouth daily. ?  ?omeprazole 40 MG capsule ?Commonly known as: PRILOSEC ?Take 1 capsule (40 mg total) by mouth daily. ?  ?rosuvastatin 5 MG tablet ?Commonly known as: CRESTOR ?Take 1 tablet (5 mg total) by mouth daily. For cholesterol ?  ?silodosin 4 MG Caps capsule ?Commonly known as: RAPAFLO ?Take 1 capsule (4 mg total) by mouth daily with breakfast. ?  ? ?  ? ? ? ?Follow-up: Return in about 2 weeks (around 06/01/2021). ? ?Claretta Fraise, M.D. ?

## 2021-05-19 ENCOUNTER — Inpatient Hospital Stay (HOSPITAL_COMMUNITY)
Admission: EM | Admit: 2021-05-19 | Discharge: 2021-05-22 | DRG: 446 | Disposition: A | Discharge status: Home / Self Care | Payer: Medicare Other | Attending: Internal Medicine | Admitting: Internal Medicine

## 2021-05-19 ENCOUNTER — Emergency Department (HOSPITAL_COMMUNITY): Payer: Medicare Other

## 2021-05-19 ENCOUNTER — Encounter (HOSPITAL_COMMUNITY): Payer: Self-pay

## 2021-05-19 ENCOUNTER — Other Ambulatory Visit: Payer: Self-pay

## 2021-05-19 DIAGNOSIS — Z8673 Personal history of transient ischemic attack (TIA), and cerebral infarction without residual deficits: Secondary | ICD-10-CM

## 2021-05-19 DIAGNOSIS — E1140 Type 2 diabetes mellitus with diabetic neuropathy, unspecified: Secondary | ICD-10-CM

## 2021-05-19 DIAGNOSIS — R112 Nausea with vomiting, unspecified: Secondary | ICD-10-CM

## 2021-05-19 DIAGNOSIS — I2510 Atherosclerotic heart disease of native coronary artery without angina pectoris: Secondary | ICD-10-CM | POA: Diagnosis present

## 2021-05-19 DIAGNOSIS — Z87442 Personal history of urinary calculi: Secondary | ICD-10-CM

## 2021-05-19 DIAGNOSIS — I10 Essential (primary) hypertension: Secondary | ICD-10-CM | POA: Diagnosis present

## 2021-05-19 DIAGNOSIS — Z8249 Family history of ischemic heart disease and other diseases of the circulatory system: Secondary | ICD-10-CM

## 2021-05-19 DIAGNOSIS — Z7984 Long term (current) use of oral hypoglycemic drugs: Secondary | ICD-10-CM

## 2021-05-19 DIAGNOSIS — Z961 Presence of intraocular lens: Secondary | ICD-10-CM | POA: Diagnosis present

## 2021-05-19 DIAGNOSIS — Z87891 Personal history of nicotine dependence: Secondary | ICD-10-CM

## 2021-05-19 DIAGNOSIS — K219 Gastro-esophageal reflux disease without esophagitis: Secondary | ICD-10-CM | POA: Diagnosis present

## 2021-05-19 DIAGNOSIS — Z79899 Other long term (current) drug therapy: Secondary | ICD-10-CM

## 2021-05-19 DIAGNOSIS — Z9842 Cataract extraction status, left eye: Secondary | ICD-10-CM

## 2021-05-19 DIAGNOSIS — R7989 Other specified abnormal findings of blood chemistry: Secondary | ICD-10-CM

## 2021-05-19 DIAGNOSIS — I4891 Unspecified atrial fibrillation: Secondary | ICD-10-CM | POA: Diagnosis present

## 2021-05-19 DIAGNOSIS — I482 Chronic atrial fibrillation, unspecified: Secondary | ICD-10-CM

## 2021-05-19 DIAGNOSIS — Z9889 Other specified postprocedural states: Secondary | ICD-10-CM

## 2021-05-19 DIAGNOSIS — Z7901 Long term (current) use of anticoagulants: Secondary | ICD-10-CM

## 2021-05-19 DIAGNOSIS — R1314 Dysphagia, pharyngoesophageal phase: Secondary | ICD-10-CM | POA: Diagnosis present

## 2021-05-19 DIAGNOSIS — K8050 Calculus of bile duct without cholangitis or cholecystitis without obstruction: Secondary | ICD-10-CM

## 2021-05-19 DIAGNOSIS — R748 Abnormal levels of other serum enzymes: Secondary | ICD-10-CM

## 2021-05-19 DIAGNOSIS — Z9841 Cataract extraction status, right eye: Secondary | ICD-10-CM

## 2021-05-19 DIAGNOSIS — G4733 Obstructive sleep apnea (adult) (pediatric): Secondary | ICD-10-CM | POA: Diagnosis present

## 2021-05-19 DIAGNOSIS — Z96653 Presence of artificial knee joint, bilateral: Secondary | ICD-10-CM | POA: Diagnosis present

## 2021-05-19 DIAGNOSIS — H9193 Unspecified hearing loss, bilateral: Secondary | ICD-10-CM | POA: Diagnosis present

## 2021-05-19 DIAGNOSIS — E7800 Pure hypercholesterolemia, unspecified: Secondary | ICD-10-CM | POA: Diagnosis present

## 2021-05-19 DIAGNOSIS — G4730 Sleep apnea, unspecified: Secondary | ICD-10-CM | POA: Diagnosis present

## 2021-05-19 DIAGNOSIS — K8051 Calculus of bile duct without cholangitis or cholecystitis with obstruction: Principal | ICD-10-CM | POA: Diagnosis present

## 2021-05-19 DIAGNOSIS — Z823 Family history of stroke: Secondary | ICD-10-CM

## 2021-05-19 DIAGNOSIS — Z833 Family history of diabetes mellitus: Secondary | ICD-10-CM

## 2021-05-19 DIAGNOSIS — Z888 Allergy status to other drugs, medicaments and biological substances status: Secondary | ICD-10-CM

## 2021-05-19 DIAGNOSIS — R1013 Epigastric pain: Principal | ICD-10-CM

## 2021-05-19 LAB — CMP14+EGFR
ALT: 228 IU/L — ABNORMAL HIGH (ref 0–44)
AST: 135 IU/L — ABNORMAL HIGH (ref 0–40)
Albumin/Globulin Ratio: 1.8 (ref 1.2–2.2)
Albumin: 4.5 g/dL (ref 3.6–4.6)
Alkaline Phosphatase: 264 IU/L — ABNORMAL HIGH (ref 44–121)
BUN/Creatinine Ratio: 16 (ref 10–24)
BUN: 12 mg/dL (ref 8–27)
Bilirubin Total: 1.8 mg/dL — ABNORMAL HIGH (ref 0.0–1.2)
CO2: 26 mmol/L (ref 20–29)
Calcium: 9.1 mg/dL (ref 8.6–10.2)
Chloride: 99 mmol/L (ref 96–106)
Creatinine, Ser: 0.73 mg/dL — ABNORMAL LOW (ref 0.76–1.27)
Globulin, Total: 2.5 g/dL (ref 1.5–4.5)
Glucose: 133 mg/dL — ABNORMAL HIGH (ref 70–99)
Potassium: 3.8 mmol/L (ref 3.5–5.2)
Sodium: 139 mmol/L (ref 134–144)
Total Protein: 7 g/dL (ref 6.0–8.5)
eGFR: 90 mL/min/{1.73_m2} (ref >59)

## 2021-05-19 LAB — CBC WITH DIFFERENTIAL/PLATELET
Basophils Absolute: 0 10*3/uL (ref 0.0–0.2)
Basos: 1 %
EOS (ABSOLUTE): 0.2 10*3/uL (ref 0.0–0.4)
Eos: 4 %
Hematocrit: 41.2 % (ref 37.5–51.0)
Hemoglobin: 14.1 g/dL (ref 13.0–17.7)
Immature Grans (Abs): 0 10*3/uL (ref 0.0–0.1)
Immature Granulocytes: 0 %
Lymphocytes Absolute: 1.3 10*3/uL (ref 0.7–3.1)
Lymphs: 22 %
MCH: 31.8 pg (ref 26.6–33.0)
MCHC: 34.2 g/dL (ref 31.5–35.7)
MCV: 93 fL (ref 79–97)
Monocytes Absolute: 0.8 10*3/uL (ref 0.1–0.9)
Monocytes: 14 %
Neutrophils Absolute: 3.5 10*3/uL (ref 1.4–7.0)
Neutrophils: 59 %
Platelets: 211 10*3/uL (ref 150–450)
RBC: 4.44 x10E6/uL (ref 4.14–5.80)
RDW: 12.8 % (ref 11.6–15.4)
WBC: 5.9 10*3/uL (ref 3.4–10.8)

## 2021-05-19 LAB — URINALYSIS, ROUTINE W REFLEX MICROSCOPIC
Glucose, UA: NEGATIVE mg/dL
Hgb urine dipstick: NEGATIVE
Ketones, ur: 5 mg/dL — AB
Leukocytes,Ua: NEGATIVE
Nitrite: NEGATIVE
Protein, ur: 30 mg/dL — AB
Specific Gravity, Urine: 1.018 (ref 1.005–1.030)
pH: 6 (ref 5.0–8.0)

## 2021-05-19 LAB — COMPREHENSIVE METABOLIC PANEL
ALT: 229 U/L — ABNORMAL HIGH (ref 0–44)
AST: 160 U/L — ABNORMAL HIGH (ref 15–41)
Albumin: 4 g/dL (ref 3.5–5.0)
Alkaline Phosphatase: 264 U/L — ABNORMAL HIGH (ref 38–126)
Anion gap: 10 (ref 5–15)
BUN: 14 mg/dL (ref 8–23)
CO2: 24 mmol/L (ref 22–32)
Calcium: 8.9 mg/dL (ref 8.9–10.3)
Chloride: 102 mmol/L (ref 98–111)
Creatinine, Ser: 0.52 mg/dL — ABNORMAL LOW (ref 0.61–1.24)
GFR, Estimated: >60 mL/min (ref >60)
Glucose, Bld: 135 mg/dL — ABNORMAL HIGH (ref 70–99)
Potassium: 3.6 mmol/L (ref 3.5–5.1)
Sodium: 136 mmol/L (ref 135–145)
Total Bilirubin: 6.5 mg/dL — ABNORMAL HIGH (ref 0.3–1.2)
Total Protein: 7.4 g/dL (ref 6.5–8.1)

## 2021-05-19 LAB — CBG MONITORING, ED: Glucose-Capillary: 144 mg/dL — ABNORMAL HIGH (ref 70–99)

## 2021-05-19 LAB — LIPASE, BLOOD: Lipase: 33 U/L (ref 11–51)

## 2021-05-19 LAB — CBC
HCT: 43.2 % (ref 39.0–52.0)
Hemoglobin: 14.1 g/dL (ref 13.0–17.0)
MCH: 31.8 pg (ref 26.0–34.0)
MCHC: 32.6 g/dL (ref 30.0–36.0)
MCV: 97.5 fL (ref 80.0–100.0)
Platelets: 217 10*3/uL (ref 150–400)
RBC: 4.43 MIL/uL (ref 4.22–5.81)
RDW: 13.8 % (ref 11.5–15.5)
WBC: 9.3 10*3/uL (ref 4.0–10.5)
nRBC: 0 % (ref 0.0–0.2)

## 2021-05-19 LAB — BILIRUBIN, FRACTIONATED(TOT/DIR/INDIR)
Bilirubin, Direct: 4.2 mg/dL — ABNORMAL HIGH (ref 0.0–0.2)
Indirect Bilirubin: 3.1 mg/dL — ABNORMAL HIGH (ref 0.3–0.9)
Total Bilirubin: 7.3 mg/dL — ABNORMAL HIGH (ref 0.3–1.2)

## 2021-05-19 LAB — TROPONIN I (HIGH SENSITIVITY)
Troponin I (High Sensitivity): 5 ng/L (ref <18)
Troponin I (High Sensitivity): 6 ng/L (ref <18)

## 2021-05-19 LAB — LIPASE: Lipase: 22 U/L (ref 13–78)

## 2021-05-19 LAB — ACETAMINOPHEN LEVEL: Acetaminophen (Tylenol), Serum: <10 ug/mL — ABNORMAL LOW (ref 10–30)

## 2021-05-19 MED ORDER — ONDANSETRON HCL 4 MG/2ML IJ SOLN: 4.0000 mg | Freq: Four times a day (QID) | INTRAMUSCULAR | Status: DC | PRN

## 2021-05-19 MED ORDER — LACTATED RINGERS IV SOLN: INTRAVENOUS | Status: AC

## 2021-05-19 MED ORDER — POTASSIUM CHLORIDE CRYS ER 20 MEQ PO TBCR
40.0000 meq | EXTENDED_RELEASE_TABLET | Freq: Once | ORAL | Status: AC
  Administered 2021-05-19: 40 meq via ORAL
  Filled 2021-05-19: qty 2

## 2021-05-19 MED ORDER — ONDANSETRON HCL 4 MG PO TABS: 4.0000 mg | ORAL_TABLET | Freq: Four times a day (QID) | ORAL | Status: DC | PRN

## 2021-05-19 MED ORDER — ACETAMINOPHEN 325 MG PO TABS: 650.0000 mg | ORAL_TABLET | Freq: Four times a day (QID) | ORAL | Status: DC | PRN

## 2021-05-19 MED ORDER — LACTATED RINGERS IV SOLN: INTRAVENOUS | Status: DC

## 2021-05-19 MED ORDER — AMLODIPINE BESYLATE 5 MG PO TABS
5.0000 mg | ORAL_TABLET | Freq: Every day | ORAL | Status: DC
  Administered 2021-05-21 – 2021-05-22 (×2): 5 mg via ORAL
  Filled 2021-05-19 (×3): qty 1

## 2021-05-19 MED ORDER — ACETAMINOPHEN 650 MG RE SUPP: 650.0000 mg | Freq: Four times a day (QID) | RECTAL | Status: DC | PRN

## 2021-05-19 MED ORDER — INSULIN ASPART 100 UNIT/ML IJ SOLN
0.0000 [IU] | Freq: Three times a day (TID) | INTRAMUSCULAR | Status: DC
  Administered 2021-05-19 – 2021-05-21 (×2): 1 [IU] via SUBCUTANEOUS
  Administered 2021-05-21 – 2021-05-22 (×2): 2 [IU] via SUBCUTANEOUS
  Filled 2021-05-19: qty 1

## 2021-05-19 MED ORDER — MORPHINE SULFATE (PF) 2 MG/ML IV SOLN: 2.0000 mg | INTRAVENOUS | Status: DC | PRN

## 2021-05-19 MED ORDER — LISINOPRIL 10 MG PO TABS
20.0000 mg | ORAL_TABLET | Freq: Every day | ORAL | Status: DC
  Administered 2021-05-19 – 2021-05-21 (×3): 20 mg via ORAL
  Filled 2021-05-19 (×4): qty 2

## 2021-05-19 NOTE — H&P (Signed)
?History and Physical  ? ? ?[name] QIW:[id number] DOB: Jun 18, 1935 DOA: 05/19/2021 ? ?PCP: Claretta Fraise, MD  ? ?Patient coming from: Home ? ?I have personally briefly reviewed patient's old medical records in [place] ? ?Chief Complaint: Abdominal pain ? ?HPI: [name] is a 86 y.o. male with medical history significant for DM, HTN, OSA, atrial fibrillation, CAD. ?Patient presented to the ED with complaints of upper abdominal pain that started 4 days ago.  He went to urgent care and was referred to [facility], where he was told his liver enzymes were elevated, CT abdomen and pelvis was without acute findings, low-density lesions favoring hemangiomas. He was referred for an ultrasound, told to follow-up with his primary care doctor.  Early this morning at about 3 AM, abdominal pain reoccurred and woke him from sleep. ?He reports dry heaving and one episode of vomiting today.  No fevers no chills.  Patient has had a cholecystectomy in the past.  Denies alcohol intake. ? ?ED Course: Stable vitals.  Elevated liver enzymes AST 160, ALT 229, ALP 264.  Total bilirubin 6.5.  RUQ Ultrasound without acute abnormality, suggested hemangioma. ?EDP talked to GI on-call-Dr. Jenetta Downer, plan for MRCP tomorrow, check fractionated bilirubin, hepatitis panel. ? ?Review of Systems: As per HPI all other systems reviewed and negative. ? ?Past Medical History:  ?Diagnosis Date  ? Arthritis   ? knees, shoulder,  hips (10/24/2017)  ? Atrial fibrillation ([place])   ? on eliquis  ? BPH (benign prostatic hyperplasia)   ? Cataracts, bilateral   ? removed by surgery  ? Coronary artery disease excluded   ? Diabetes mellitus without complication ([place])   ? type 2  ? Diverticulosis   ? Endocarditis, valve unspecified, unspecified cause   ? GERD (gastroesophageal reflux disease)   ? H/O mitral valve repair   ? Postoperative ring with postoperative atrial fibrillation  ? Hearing loss   ? both ears - does not use hearing aids  ?  Heart murmur   ? hx  ? Hiatal hernia 06/13/2002  ? High cholesterol   ? History of kidney stones   ? passed spontaneously x2   ? OSA on CPAP   ? uses cpap nightly  ? Stroke [facility])   ? Unspecified essential hypertension   ? Weakness of both arms 07/19/2012  ? Wears dentures   ? upper and lower partial  ? ? ?Past Surgical History:  ?Procedure Laterality Date  ? CARDIAC CATHETERIZATION  02/14/2007  ? x 2  ? CARDIOVERSION N/A 04/14/2016  ? Procedure: CARDIOVERSION;  Surgeon: Minus Breeding, MD;  Location: [place];  Service: Cardiovascular;  Laterality: N/A;  ? CATARACT EXTRACTION W/PHACO Right 05/12/2015  ? Procedure: CATARACT EXTRACTION PHACO AND INTRAOCULAR LENS PLACEMENT RIGHT EYE CDE=7.75;  Surgeon: Tonny Branch, MD;  Location: [facility];  Service: Ophthalmology;  Laterality: Right;  ? CATARACT EXTRACTION W/PHACO Left 06/12/2015  ? Procedure: CATARACT EXTRACTION PHACO AND INTRAOCULAR LENS PLACEMENT (IOC);  Surgeon: Tonny Branch, MD;  Location: [facility];  Service: Ophthalmology;  Laterality: Left;  CDE:  13.17  ? COLONOSCOPY    ? EYE SURGERY    ? Bilateral cataracts  ? INJECTION KNEE Right 02/20/2016  ? Procedure: KNEE INJECTION;  Surgeon: [name], MD;  Location: [place];  Service: Orthopedics;  Laterality: Right;  ? JOINT REPLACEMENT    ? KNEE ARTHROSCOPY Left   ? LAPAROSCOPIC CHOLECYSTECTOMY    ? MITRAL VALVE REPAIR  ~ 2003  ? MULTIPLE TOOTH  EXTRACTIONS    ? SHOULDER ARTHROSCOPY WITH ROTATOR CUFF REPAIR AND OPEN BICEPS TENODESIS Right 01/26/2019  ? Procedure: right shoulder arthroscopy, rotator cuff repair and biceps tenodesis;  Surgeon: [name], MD;  Location: [place];  Service: Orthopedics;  Laterality: Right;  ? SHOULDER OPEN ROTATOR CUFF REPAIR Left   ? TOTAL KNEE ARTHROPLASTY Left 02/20/2016  ? Procedure: LEFT TOTAL KNEE ARTHROPLASTY WITH RIGHT KNEE INJECTION;  Surgeon: [name], MD;  Location: [place];  Service: Orthopedics;  Laterality: Left;  ? TOTAL KNEE ARTHROPLASTY Right 07/06/2019  ? Procedure: RIGHT TOTAL  KNEE ARTHROPLASTY;  Surgeon: [name], MD;  Location: [place];  Service: Orthopedics;  Laterality: Right;  ? UPPER GI ENDOSCOPY    ? VASECTOMY    ? ? ? reports that he quit smoking about 33 years ago. His smoking use included cigarettes. He has a 105.00 pack-year smoking history. He has never used smokeless tobacco. He reports that he does not drink alcohol and does not use drugs. ? ?Allergies  ?Allergen Reactions  ? Flomax [Tamsulosin Hcl] Other (See Comments)  ?  Makes him "swimmy headed"  ? ? ?Family History  ?Problem Relation Age of Onset  ? Congestive Heart Failure Father   ? Heart disease Father   ? Stroke Brother   ? Stroke Sister   ? Diabetes Mellitus II Brother   ? Diabetes Mellitus II Sister   ? Lung cancer Brother   ? Stroke Sister   ? Stroke Brother   ? Colon cancer Neg Hx   ? Esophageal cancer Neg Hx   ? Rectal cancer Neg Hx   ? Stomach cancer Neg Hx   ? ? ?Prior to Admission medications   ?Medication Sig Start Date End Date Taking? Authorizing Provider  ?amLODipine (NORVASC) 5 MG tablet Take 1 tablet (5 mg total) by mouth daily. 01/01/21   Claretta Fraise, MD  ?apixaban (ELIQUIS) 5 MG TABS tablet Take 1 tablet (5 mg total) by mouth 2 (two) times daily. 01/01/21   Claretta Fraise, MD  ?Calcium Carb-Cholecalciferol (CALCIUM 600 + D PO) Take 1 tablet by mouth daily.    [provider]  ?Coenzyme Q10 300 MG CAPS Take 300 mg by mouth daily.    [provider]  ?lisinopril (ZESTRIL) 20 MG tablet TAKE 1 TABLET BY MOUTH EVERYDAY AT BEDTIME 04/06/21   Claretta Fraise, MD  ?meclizine (ANTIVERT) 25 MG tablet Take 25 mg by mouth 3 (three) times daily as needed. 08/08/20   [provider]  ?metFORMIN (GLUCOPHAGE-XR) 500 MG 24 hr tablet TAKE 1 TABLET BY MOUTH EVERY DAY WITH BREAKFAST 01/01/21   Claretta Fraise, MD  ?Multiple Vitamin (MULTIVITAMIN WITH MINERALS) TABS tablet Take 1 tablet by mouth daily.    [provider]  ?omeprazole (PRILOSEC) 40 MG capsule Take 1 capsule (40 mg  total) by mouth daily. 01/01/21   Claretta Fraise, MD  ?rosuvastatin (CRESTOR) 5 MG tablet Take 1 tablet (5 mg total) by mouth daily. For cholesterol 01/01/21   Claretta Fraise, MD  ?silodosin (RAPAFLO) 4 MG CAPS capsule Take 1 capsule (4 mg total) by mouth daily with breakfast. 01/01/21   Claretta Fraise, MD  ? ? ?Physical Exam: ?Vitals:  ? 05/19/21 1336 05/19/21 1358 05/19/21 1630  ?BP:  (!) 120/55 (!) 117/52  ?Pulse:  70 65  ?Resp:  18 18  ?Temp:  98.2 ?F (36.8 ?C)   ?TempSrc:  Oral   ?SpO2:  96% 94%  ?Weight: 96 kg    ?  Height: '5\' 11"'$  (1.803 m)    ? ? ?Constitutional: NAD, calm, comfortable ?Vitals:  ? 05/19/21 1336 05/19/21 1358 05/19/21 1630  ?BP:  (!) 120/55 (!) 117/52  ?Pulse:  70 65  ?Resp:  18 18  ?Temp:  98.2 ?F (36.8 ?C)   ?TempSrc:  Oral   ?SpO2:  96% 94%  ?Weight: 96 kg    ?Height: '5\' 11"'$  (1.803 m)    ? ?Eyes: PERRL, lids and conjunctivae normal ?ENMT: Mucous membranes are moist.   ?Neck: normal, supple, no masses, no thyromegaly ?Respiratory: clear to auscultation bilaterally, no wheezing, no crackles. Normal respiratory effort. No accessory muscle use.  ?Cardiovascular: Regular rate and rhythm, no murmurs / rubs / gallops. No extremity edema.  Lower extremities warm.  ?Abdomen: no tenderness, no masses palpated. No hepatosplenomegaly. Bowel sounds positive.  ?Musculoskeletal: no clubbing / cyanosis. No joint deformity upper and lower extremities. Good ROM, no contractures. Normal muscle tone.  ?Skin: no rashes, lesions, ulcers. No induration ?Neurologic: No apparent cranial nerve abnormality moving extremities spontaneously ?Psychiatric: Normal judgment and insight. Alert and oriented x 3. Normal mood.  ? ?Labs on Admission: I have personally reviewed following labs and imaging studies ? ?CBC: ?Recent Labs  ?Lab 05/18/21 ?0945 05/19/21 ?1410  ?WBC 5.9 9.3  ?NEUTROABS 3.5  --   ?HGB 14.1 14.1  ?HCT 41.2 43.2  ?MCV 93 97.5  ?PLT 211 217  ? ?Basic Metabolic Panel: ?Recent Labs  ?Lab 05/18/21 ?0945  05/19/21 ?1410  ?NA 139 136  ?K 3.8 3.6  ?CL 99 102  ?CO2 26 24  ?GLUCOSE 133* 135*  ?BUN 12 14  ?CREATININE 0.73* 0.52*  ?CALCIUM 9.1 8.9  ? ?GFR: ?Estimated Creatinine Clearance: 82.7 mL/min (A) (by C-G formula base

## 2021-05-19 NOTE — Assessment & Plan Note (Signed)
HgbA1c 6.2 ?-Hold metformin ?- SSI- S ?

## 2021-05-19 NOTE — Assessment & Plan Note (Signed)
Resume CPAP, reports complaince ?

## 2021-05-19 NOTE — Progress Notes (Signed)
Pt wife viewed results on mychart, wants to know what to do now? Does he need to go to a specialist? ?

## 2021-05-19 NOTE — ED Triage Notes (Addendum)
Patient arrived via EMS with complaints of epigastric pain for the last few days. Patient states he was told he has elevated liver enzymes and possible gallstones but patient states he does not have gallbladder. Patient states relief following vomiting.  ?

## 2021-05-19 NOTE — ED Notes (Signed)
Patient verbalized understanding of MSE process. No sign pad available.  ?

## 2021-05-19 NOTE — Assessment & Plan Note (Addendum)
Epigastric pain, vomiting.  Liver enzymes elevated AST 160, ALT 229.  ALP 264, total bilirubin 6.5.  Denies alcohol intake.  RUQ ultrasound shows 3.1 cm rounded hypoechoic abnormality in right hepatic lobe posteriorly most consistent with hemangioma.  ?- EDP talked to Dr. Jenetta Downer, plan for MRCP tomorrow ?-N.p.o. midnight ?-Trend liver enzymes ?- Continue LR 75 cc/hr x 20hrs ?-IV morphine '2mg'$  as needed ?-IV Zofran as needed ?

## 2021-05-19 NOTE — ED Provider Notes (Signed)
? ?Emergency Department Provider Note ? ? ?I have reviewed the triage vital signs and the nursing notes. ? ? ?HISTORY ? ?Chief Complaint ?Abdominal Pain ? ? ?HPI ?[name] is a 86 y.o. male with past history of CAD, diabetes, HLD, and h/o a fib presents to the emergency department for evaluation of epigastric abdominal pain.  Symptoms have been intermittent with associated vomiting over the past 3 days.  Patient had a fever on the first day of symptoms but that has not returned.  No lower abdominal discomfort or diarrhea.  No chest pain or shortness of breath.  Patient went to an outside emergency department apparently had elevated LFTs.  He had follow-up appointment with the PCP yesterday and reportedly those values decreased but symptoms returned today after eating.  He has had a cholecystectomy. No active pain currently but was hurting earlier.  ? ? ?Past Medical History:  ?Diagnosis Date  ? Arthritis   ? knees, shoulder,  hips (10/24/2017)  ? Atrial fibrillation ([place])   ? on eliquis  ? BPH (benign prostatic hyperplasia)   ? Cataracts, bilateral   ? removed by surgery  ? Coronary artery disease excluded   ? Diabetes mellitus without complication ([place])   ? type 2  ? Diverticulosis   ? Endocarditis, valve unspecified, unspecified cause   ? GERD (gastroesophageal reflux disease)   ? H/O mitral valve repair   ? Postoperative ring with postoperative atrial fibrillation  ? Hearing loss   ? both ears - does not use hearing aids  ? Heart murmur   ? hx  ? Hiatal hernia 06/13/2002  ? High cholesterol   ? History of kidney stones   ? passed spontaneously x2   ? OSA on CPAP   ? uses cpap nightly  ? Stroke [facility])   ? Unspecified essential hypertension   ? Weakness of both arms 07/19/2012  ? Wears dentures   ? upper and lower partial  ? ? ?Review of Systems ? ?Constitutional: No fever/chills ?Eyes: No visual changes. ?ENT: No sore throat. ?Cardiovascular: Denies chest pain. ?Respiratory: Denies shortness of  breath. ?Gastrointestinal: Positive epigastric abdominal pain. Positive nausea and vomiting.  No diarrhea.  No constipation. ?Genitourinary: Negative for dysuria. ?Musculoskeletal: Negative for back pain. ?Skin: Negative for rash. ?Neurological: Negative for headaches, focal weakness or numbness. ? ? ?____________________________________________ ? ? ?PHYSICAL EXAM: ? ?VITAL SIGNS: ?ED Triage Vitals  ?Enc Vitals Group  ?   BP 05/19/21 1358 (!) 120/55  ?   Pulse Rate 05/19/21 1358 70  ?   Resp 05/19/21 1358 18  ?   Temp 05/19/21 1358 98.2 ?F (36.8 ?C)  ?   Temp Source 05/19/21 1358 Oral  ?   SpO2 05/19/21 1358 96 %  ?   Weight 05/19/21 1336 211 lb 10.3 oz (96 kg)  ?   Height 05/19/21 1336 '5\' 11"'$  (1.803 m)  ? ?Constitutional: Alert and oriented. Well appearing and in no acute distress. ?Eyes: Conjunctivae are normal.  ?Head: Atraumatic. ?Nose: No congestion/rhinnorhea. ?Mouth/Throat: Mucous membranes are moist.   ?Neck: No stridor.   ?Cardiovascular: Normal rate, regular rhythm. Good peripheral circulation. Grossly normal heart sounds.   ?Respiratory: Normal respiratory effort.  No retractions. Lungs CTAB. ?Gastrointestinal: Soft and nontender. No distention.  ?Musculoskeletal: No gross deformities of extremities. ?Neurologic:  Normal speech and language.  ?Skin:  Skin is warm, dry and intact. No rash noted. ? ? ?____________________________________________ ?  ?LABS ?(all labs ordered are listed, but only abnormal results are  displayed) ? ?Labs Reviewed  ?COMPREHENSIVE METABOLIC PANEL - Abnormal; Notable for the following components:  ?    Result Value  ? Glucose, Bld 135 (*)   ? Creatinine, Ser 0.52 (*)   ? AST 160 (*)   ? ALT 229 (*)   ? Alkaline Phosphatase 264 (*)   ? Total Bilirubin 6.5 (*)   ? All other components within normal limits  ?URINALYSIS, ROUTINE W REFLEX MICROSCOPIC - Abnormal; Notable for the following components:  ? Color, Urine AMBER (*)   ? Bilirubin Urine MODERATE (*)   ? Ketones, ur 5 (*)   ?  Protein, ur 30 (*)   ? Bacteria, UA RARE (*)   ? All other components within normal limits  ?BILIRUBIN, FRACTIONATED(TOT/DIR/INDIR) - Abnormal; Notable for the following components:  ? Total Bilirubin 7.3 (*)   ? Bilirubin, Direct 4.2 (*)   ? Indirect Bilirubin 3.1 (*)   ? All other components within normal limits  ?ACETAMINOPHEN LEVEL - Abnormal; Notable for the following components:  ? Acetaminophen (Tylenol), Serum <10 (*)   ? All other components within normal limits  ?COMPREHENSIVE METABOLIC PANEL - Abnormal; Notable for the following components:  ? Glucose, Bld 142 (*)   ? Creatinine, Ser 0.54 (*)   ? Calcium 8.6 (*)   ? AST 104 (*)   ? ALT 183 (*)   ? Alkaline Phosphatase 256 (*)   ? Total Bilirubin 6.8 (*)   ? All other components within normal limits  ?GLUCOSE, CAPILLARY - Abnormal; Notable for the following components:  ? Glucose-Capillary 119 (*)   ? All other components within normal limits  ?GLUCOSE, CAPILLARY - Abnormal; Notable for the following components:  ? Glucose-Capillary 103 (*)   ? All other components within normal limits  ?COMPREHENSIVE METABOLIC PANEL - Abnormal; Notable for the following components:  ? Glucose, Bld 126 (*)   ? Creatinine, Ser 0.58 (*)   ? AST 65 (*)   ? ALT 149 (*)   ? Alkaline Phosphatase 286 (*)   ? Total Bilirubin 4.4 (*)   ? All other components within normal limits  ?GLUCOSE, CAPILLARY - Abnormal; Notable for the following components:  ? Glucose-Capillary 118 (*)   ? All other components within normal limits  ?GLUCOSE, CAPILLARY - Abnormal; Notable for the following components:  ? Glucose-Capillary 111 (*)   ? All other components within normal limits  ?GLUCOSE, CAPILLARY - Abnormal; Notable for the following components:  ? Glucose-Capillary 121 (*)   ? All other components within normal limits  ?GLUCOSE, CAPILLARY - Abnormal; Notable for the following components:  ? Glucose-Capillary 136 (*)   ? All other components within normal limits  ?GLUCOSE, CAPILLARY -  Abnormal; Notable for the following components:  ? Glucose-Capillary 159 (*)   ? All other components within normal limits  ?CBG MONITORING, ED - Abnormal; Notable for the following components:  ? Glucose-Capillary 144 (*)   ? All other components within normal limits  ?LIPASE, BLOOD  ?CBC  ?HEPATITIS PANEL, ACUTE  ?CBC  ?CBC  ?GLUCOSE, CAPILLARY  ?COMPREHENSIVE METABOLIC PANEL  ?CBC  ?AMYLASE  ?TROPONIN I (HIGH SENSITIVITY)  ?TROPONIN I (HIGH SENSITIVITY)  ? ?____________________________________________ ? ?EKG ? ? EKG Interpretation ? ?Date/Time:  Tuesday May 19 2021 13:57:23 EDT ?Ventricular Rate:  59 ?PR Interval:    ?QRS Duration: 88 ?QT Interval:  396 ?QTC Calculation: 392 ?R Axis:   118 ?Text Interpretation: Atrial fibrillation with slow ventricular response with premature ventricular or aberrantly conducted  complexes Right axis deviation Abnormal ECG When compared with ECG of 24-Oct-2017 11:16, T wave inversion no longer evident in Inferior leads Confirmed by Nanda Quinton [phone]) on 05/19/2021 3:16:24 PM ?  ? ?  ? ? ? ?____________________________________________ ? ? ?PROCEDURES ? ?Procedure(s) performed:  ? ?Procedures ? ?None  ?____________________________________________ ? ? ?INITIAL IMPRESSION / ASSESSMENT AND PLAN / ED COURSE ? ?Pertinent labs & imaging results that were available during my care of the patient were reviewed by me and considered in my medical decision making (see chart for details). ?  ?This patient is Presenting for Evaluation of abdominal pain, which does require a range of treatment options, and is a complaint that involves a high risk of morbidity and mortality. ? ?The Differential Diagnoses includes but is not exclusive to acute cholecystitis, intrathoracic causes for epigastric abdominal pain, gastritis, duodenitis, pancreatitis, small bowel or large bowel obstruction, abdominal aortic aneurysm, hernia, gastritis, etc. ? ? ?Critical Interventions-  ?  ?Medications  ?amLODipine  (NORVASC) tablet 5 mg ( Oral MAR Unhold 05/21/21 1934)  ?lisinopril (ZESTRIL) tablet 20 mg (20 mg Oral Given 05/21/21 2220)  ?insulin aspart (novoLOG) injection 0-9 Units (2 Units Subcutaneous Given 05/21/21 2225)  ?acetaminophen (TYLEN

## 2021-05-19 NOTE — Assessment & Plan Note (Signed)
Rate controlled, not on rate limiting medication, on anticoagulation with Eliquis  ?- hold Eliquis pending GI evaluation and MRCP tomorrow ?

## 2021-05-19 NOTE — Assessment & Plan Note (Signed)
Stable. ?-Resume Norvasc, lisinopril ?

## 2021-05-20 ENCOUNTER — Other Ambulatory Visit: Payer: Self-pay | Admitting: Family Medicine

## 2021-05-20 ENCOUNTER — Other Ambulatory Visit (HOSPITAL_COMMUNITY): Payer: Self-pay | Admitting: Radiology

## 2021-05-20 ENCOUNTER — Observation Stay (HOSPITAL_COMMUNITY): Payer: Medicare Other

## 2021-05-20 DIAGNOSIS — Z9841 Cataract extraction status, right eye: Secondary | ICD-10-CM | POA: Diagnosis not present

## 2021-05-20 DIAGNOSIS — I10 Essential (primary) hypertension: Secondary | ICD-10-CM | POA: Diagnosis present

## 2021-05-20 DIAGNOSIS — I4891 Unspecified atrial fibrillation: Secondary | ICD-10-CM | POA: Diagnosis present

## 2021-05-20 DIAGNOSIS — R1013 Epigastric pain: Secondary | ICD-10-CM

## 2021-05-20 DIAGNOSIS — Z7984 Long term (current) use of oral hypoglycemic drugs: Secondary | ICD-10-CM | POA: Diagnosis not present

## 2021-05-20 DIAGNOSIS — E7800 Pure hypercholesterolemia, unspecified: Secondary | ICD-10-CM | POA: Diagnosis present

## 2021-05-20 DIAGNOSIS — Z96653 Presence of artificial knee joint, bilateral: Secondary | ICD-10-CM | POA: Diagnosis present

## 2021-05-20 DIAGNOSIS — Z8249 Family history of ischemic heart disease and other diseases of the circulatory system: Secondary | ICD-10-CM | POA: Diagnosis not present

## 2021-05-20 DIAGNOSIS — R112 Nausea with vomiting, unspecified: Secondary | ICD-10-CM

## 2021-05-20 DIAGNOSIS — Z87442 Personal history of urinary calculi: Secondary | ICD-10-CM | POA: Diagnosis not present

## 2021-05-20 DIAGNOSIS — Z888 Allergy status to other drugs, medicaments and biological substances status: Secondary | ICD-10-CM | POA: Diagnosis not present

## 2021-05-20 DIAGNOSIS — Z823 Family history of stroke: Secondary | ICD-10-CM | POA: Diagnosis not present

## 2021-05-20 DIAGNOSIS — R1314 Dysphagia, pharyngoesophageal phase: Secondary | ICD-10-CM | POA: Diagnosis present

## 2021-05-20 DIAGNOSIS — K8050 Calculus of bile duct without cholangitis or cholecystitis without obstruction: Secondary | ICD-10-CM | POA: Diagnosis not present

## 2021-05-20 DIAGNOSIS — E1140 Type 2 diabetes mellitus with diabetic neuropathy, unspecified: Secondary | ICD-10-CM | POA: Diagnosis present

## 2021-05-20 DIAGNOSIS — Z961 Presence of intraocular lens: Secondary | ICD-10-CM | POA: Diagnosis present

## 2021-05-20 DIAGNOSIS — Z87891 Personal history of nicotine dependence: Secondary | ICD-10-CM | POA: Diagnosis not present

## 2021-05-20 DIAGNOSIS — K8051 Calculus of bile duct without cholangitis or cholecystitis with obstruction: Secondary | ICD-10-CM | POA: Diagnosis present

## 2021-05-20 DIAGNOSIS — I2510 Atherosclerotic heart disease of native coronary artery without angina pectoris: Secondary | ICD-10-CM | POA: Diagnosis present

## 2021-05-20 DIAGNOSIS — Z9842 Cataract extraction status, left eye: Secondary | ICD-10-CM | POA: Diagnosis not present

## 2021-05-20 DIAGNOSIS — Z79899 Other long term (current) drug therapy: Secondary | ICD-10-CM | POA: Diagnosis not present

## 2021-05-20 DIAGNOSIS — R1310 Dysphagia, unspecified: Secondary | ICD-10-CM

## 2021-05-20 DIAGNOSIS — Z7901 Long term (current) use of anticoagulants: Secondary | ICD-10-CM | POA: Diagnosis not present

## 2021-05-20 DIAGNOSIS — K219 Gastro-esophageal reflux disease without esophagitis: Secondary | ICD-10-CM | POA: Diagnosis present

## 2021-05-20 DIAGNOSIS — R748 Abnormal levels of other serum enzymes: Secondary | ICD-10-CM | POA: Diagnosis not present

## 2021-05-20 DIAGNOSIS — H9193 Unspecified hearing loss, bilateral: Secondary | ICD-10-CM | POA: Diagnosis present

## 2021-05-20 DIAGNOSIS — G4733 Obstructive sleep apnea (adult) (pediatric): Secondary | ICD-10-CM | POA: Diagnosis present

## 2021-05-20 DIAGNOSIS — Z8673 Personal history of transient ischemic attack (TIA), and cerebral infarction without residual deficits: Secondary | ICD-10-CM | POA: Diagnosis not present

## 2021-05-20 DIAGNOSIS — Z833 Family history of diabetes mellitus: Secondary | ICD-10-CM | POA: Diagnosis not present

## 2021-05-20 LAB — COMPREHENSIVE METABOLIC PANEL
ALT: 183 U/L — ABNORMAL HIGH (ref 0–44)
AST: 104 U/L — ABNORMAL HIGH (ref 15–41)
Albumin: 3.8 g/dL (ref 3.5–5.0)
Alkaline Phosphatase: 256 U/L — ABNORMAL HIGH (ref 38–126)
Anion gap: 7 (ref 5–15)
BUN: 11 mg/dL (ref 8–23)
CO2: 27 mmol/L (ref 22–32)
Calcium: 8.6 mg/dL — ABNORMAL LOW (ref 8.9–10.3)
Chloride: 105 mmol/L (ref 98–111)
Creatinine, Ser: 0.54 mg/dL — ABNORMAL LOW (ref 0.61–1.24)
GFR, Estimated: >60 mL/min (ref >60)
Glucose, Bld: 142 mg/dL — ABNORMAL HIGH (ref 70–99)
Potassium: 4.2 mmol/L (ref 3.5–5.1)
Sodium: 139 mmol/L (ref 135–145)
Total Bilirubin: 6.8 mg/dL — ABNORMAL HIGH (ref 0.3–1.2)
Total Protein: 7.2 g/dL (ref 6.5–8.1)

## 2021-05-20 LAB — CBC
HCT: 41.6 % (ref 39.0–52.0)
Hemoglobin: 13.4 g/dL (ref 13.0–17.0)
MCH: 31.2 pg (ref 26.0–34.0)
MCHC: 32.2 g/dL (ref 30.0–36.0)
MCV: 97 fL (ref 80.0–100.0)
Platelets: 216 10*3/uL (ref 150–400)
RBC: 4.29 MIL/uL (ref 4.22–5.81)
RDW: 13.9 % (ref 11.5–15.5)
WBC: 5.7 10*3/uL (ref 4.0–10.5)
nRBC: 0 % (ref 0.0–0.2)

## 2021-05-20 LAB — HEPATITIS PANEL, ACUTE
HCV Ab: NONREACTIVE
Hep A IgM: NONREACTIVE
Hep B C IgM: NONREACTIVE
Hepatitis B Surface Ag: NONREACTIVE

## 2021-05-20 LAB — GLUCOSE, CAPILLARY
Glucose-Capillary: 119 mg/dL — ABNORMAL HIGH (ref 70–99)
Glucose-Capillary: 103 mg/dL — ABNORMAL HIGH (ref 70–99)
Glucose-Capillary: 118 mg/dL — ABNORMAL HIGH (ref 70–99)

## 2021-05-20 MED ORDER — GADOBUTROL 1 MMOL/ML IV SOLN
10.0000 mL | Freq: Once | INTRAVENOUS | Status: AC | PRN
  Administered 2021-05-20: 10 mL via INTRAVENOUS

## 2021-05-20 MED ORDER — SODIUM CHLORIDE 0.9 % IV SOLN
2.0000 g | Freq: Every day | INTRAVENOUS | Status: DC
  Administered 2021-05-20 – 2021-05-21 (×2): 2 g via INTRAVENOUS
  Filled 2021-05-20 (×2): qty 20

## 2021-05-20 MED ORDER — PANTOPRAZOLE SODIUM 40 MG IV SOLR
40.0000 mg | Freq: Every day | INTRAVENOUS | Status: DC
  Administered 2021-05-20 – 2021-05-22 (×3): 40 mg via INTRAVENOUS
  Filled 2021-05-20 (×3): qty 10

## 2021-05-20 NOTE — Progress Notes (Signed)
?PROGRESS NOTE ? ? ? ?[name]  KDX:[id number] DOB: [birth year] DOA: 05/19/2021 ?PCP: Claretta Fraise, MD ? ? ?Brief Narrative:  ?[name] is a 86 y.o. male with medical history significant for DM, HTN, OSA, atrial fibrillation, CAD. ?Patient presented to the ED with complaints of upper abdominal pain that started 4 days ago.  He went to urgent care and was referred to [facility], where he was told his liver enzymes were elevated, CT abdomen and pelvis was without acute findings, low-density lesions favoring hemangiomas. He was referred for an ultrasound, told to follow-up with his primary care doctor.  Upon arriving home patient had worsening abdominal pain, intractable with episodes of dry heaving, vomiting after taking p.o. at home.  Notable history of cholecystectomy. ? ?Assessment & Plan: ?  ?Principal Problem: ?  Elevated liver enzymes ?Active Problems: ?  Type 2 diabetes mellitus with diabetic neuropathy, unspecified ([place]) ?  Essential hypertension ?  ATRIAL FIBRILLATION ?  Sleep apnea ?  H/O mitral valve repair ? ?Intractable abdominal pain, nausea, vomiting, POA ?Elevated liver enzymes of unclear etiology, POA ?Lab Results  ?Component Value Date  ? ALT 183 (H) 05/20/2021  ? AST 104 (H) 05/20/2021  ? ALKPHOS 256 (H) 05/20/2021  ? BILITOT 6.8 (H) 05/20/2021  ?GI following, appreciate insight recommendations ?MRCP pending ?Imaging unremarkable other than hemangioma ?Status post cholecystectomy 1996 ?  ?Type 2 diabetes mellitus with diabetic neuropathy, unspecified ([place]) ?HgbA1c 6.2 ?Hold metformin ?Continue sliding scale insulin, hypoglycemic protocol ? ?Obstructive sleep apnea ?Resume CPAP, reports compliance at home ?  ?Atrial fibrillation, rate controlled ?No rate control medication on home med rec, borderline bradycardic ?On Eliquis chronically, currently on hold due to possible need for intervention or procedure as above ?  ?Essential hypertension ?Continue amlodipine/lisinopril ?  ?DVT  prophylaxis: SCDs -holding home Eliquis as above ?Code Status: Full ?Family Communication: Wife at bedside ? ?Status is: Inpatient ? ?Dispo: The patient is from: Home ?             Anticipated d/c is to: Home ?             Anticipated d/c date is: 24 to 48 hours ?             Patient currently not medically stable for discharge ? ?Consultants:  ?GI ? ?Procedures:  ?MRCP ? ?Antimicrobials:  ?None ? ?Subjective: ?No acute issues or events overnight, abdominal pain improving while fasting but not completely resolved.  Otherwise denies headache, fever, chills, chest pain, shortness of breath, diarrhea, constipation. ? ?Objective: ?Vitals:  ? 05/19/21 2113 05/20/21 0007 05/20/21 0121 05/20/21 0553  ?BP: (!) 133/57 135/72  123/66  ?Pulse: 62 (!) 53 (!) 51 (!) 49  ?Resp: '20 17 18 16  '$ ?Temp:  98.2 ?F (36.8 ?C)  98.3 ?F (36.8 ?C)  ?TempSrc:  Oral    ?SpO2: 98% 100% 95% 97%  ?Weight:      ?Height:      ? ? ?Intake/Output Summary (Last 24 hours) at 05/20/2021 0650 ?Last data filed at 05/20/2021 0400 ?Gross per 24 hour  ?Intake 711.6 ml  ?Output --  ?Net 711.6 ml  ? ?Filed Weights  ? 05/19/21 1336  ?Weight: 96 kg  ? ? ?Examination: ? ?General exam: Appears calm and comfortable  ?Respiratory system: Clear to auscultation. Respiratory effort normal. ?Cardiovascular system: S1 & S2 heard, RRR. No JVD, murmurs, rubs, gallops or clicks. No pedal edema. ?Gastrointestinal system: Abdomen is nondistended, soft and nontender. No organomegaly or masses  felt. Normal bowel sounds heard. ?Central nervous system: Alert and oriented. No focal neurological deficits. ?Extremities: Symmetric 5 x 5 power. ?Skin: No rashes, lesions or ulcers ?Psychiatry: Judgement and insight appear normal. Mood & affect appropriate.  ? ? ? ?Data Reviewed: I have personally reviewed following labs and imaging studies ? ?CBC: ?Recent Labs  ?Lab 05/18/21 ?0945 05/19/21 ?1410 05/20/21 ?3491  ?WBC 5.9 9.3 5.7  ?NEUTROABS 3.5  --   --   ?HGB 14.1 14.1 13.4  ?HCT 41.2 43.2  41.6  ?MCV 93 97.5 97.0  ?PLT 211 217 216  ? ?Basic Metabolic Panel: ?Recent Labs  ?Lab 05/18/21 ?0945 05/19/21 ?1410 05/20/21 ?7915  ?NA 139 136 139  ?K 3.8 3.6 4.2  ?CL 99 102 105  ?CO2 '26 24 27  '$ ?GLUCOSE 133* 135* 142*  ?BUN '12 14 11  '$ ?CREATININE 0.73* 0.52* 0.54*  ?CALCIUM 9.1 8.9 8.6*  ? ?GFR: ?Estimated Creatinine Clearance: 82.7 mL/min (A) (by C-G formula based on SCr of 0.54 mg/dL (L)). ?Liver Function Tests: ?Recent Labs  ?Lab 05/18/21 ?0945 05/19/21 ?1410 05/19/21 ?1818 05/20/21 ?0569  ?AST 135* 160*  --  104*  ?ALT 228* 229*  --  183*  ?ALKPHOS 264* 264*  --  256*  ?BILITOT 1.8* 6.5* 7.3* 6.8*  ?PROT 7.0 7.4  --  7.2  ?ALBUMIN 4.5 4.0  --  3.8  ? ?Recent Labs  ?Lab 05/18/21 ?0945 05/19/21 ?1410  ?LIPASE 22 33  ? ?No results for input(s): AMMONIA in the last 168 hours. ?Coagulation Profile: ?No results for input(s): INR, PROTIME in the last 168 hours. ?Cardiac Enzymes: ?No results for input(s): CKTOTAL, CKMB, CKMBINDEX, TROPONINI in the last 168 hours. ?BNP (last 3 results) ?No results for input(s): PROBNP in the last 8760 hours. ?HbA1C: ?No results for input(s): HGBA1C in the last 72 hours. ?CBG: ?Recent Labs  ?Lab 05/19/21 ?2059 05/20/21 ?7948  ?GLUCAP 144* 119*  ? ?Lipid Profile: ?No results for input(s): CHOL, HDL, LDLCALC, TRIG, CHOLHDL, LDLDIRECT in the last 72 hours. ?Thyroid Function Tests: ?No results for input(s): TSH, T4TOTAL, FREET4, T3FREE, THYROIDAB in the last 72 hours. ?Anemia Panel: ?No results for input(s): VITAMINB12, FOLATE, FERRITIN, TIBC, IRON, RETICCTPCT in the last 72 hours. ?Sepsis Labs: ?No results for input(s): PROCALCITON, LATICACIDVEN in the last 168 hours. ? ?No results found for this or any previous visit (from the past 240 hour(s)).  ? ? ? ? ? ?Radiology Studies: ?US Abdomen Complete ? ?Result Date: 05/18/2021 ?CLINICAL DATA:  Elevated liver function tests. Prior cholecystectomy. EXAM: ABDOMEN ULTRASOUND COMPLETE COMPARISON:  None. FINDINGS: Gallbladder: Surgically absent  Common bile duct: Diameter: 0.7 cm Liver: Hyperechoic liver lesions compatible with known hemangiomas, including the 2.6 by 3.8 cm right hepatic lobe lesion shown on image 26 series 1. Within normal limits in parenchymal echogenicity. Portal vein is patent on color Doppler imaging with normal direction of blood flow towards the liver. IVC: No abnormality visualized. Pancreas: Not observed due to overlying bowel gas. Spleen: Size and appearance within normal limits. Right Kidney: Length: 12.9 cm. Echogenicity within normal limits. No solid mass or hydronephrosis visualized. Multiple simple renal cysts as on prior exams, index lesion 3.6 by 2.8 by 3.0 cm. Left Kidney: Length: 12.5 cm. Echogenicity within normal limits. No solid mass or hydronephrosis visualized. Multiple simple renal cysts as on prior exam, index lesion 9.5 by 5.3 by 8.8 cm. Abdominal aorta: Mid and distal abdominal aorta obscured by overlying bowel gas. Proximal abdominal aorta measures up to 2.9 cm in diameter. Abdominal  aortic atherosclerotic calcifications. Other findings: None. IMPRESSION: 1. Hyperechoic liver lesions compatible with known hepatic hemangiomas. 2. Bilateral renal cysts as shown on CT examinations. 3.  Aortic Atherosclerosis (ICD10-I70.0). 4. Nonvisualization of the pancreas and lower portions the abdominal aorta. Electronically Signed   By: Van Clines M.D.   On: 05/18/2021 13:13  ? ?US Abdomen Limited RUQ (LIVER/GB) ? ?Result Date: 05/19/2021 ?CLINICAL DATA:  Elevated liver function tests. EXAM: ULTRASOUND ABDOMEN LIMITED RIGHT UPPER QUADRANT COMPARISON:  Ultrasound of May 18, 2021.  CT scan of May 16, 2021. FINDINGS: Gallbladder: Status post cholecystectomy. Common bile duct: Diameter: 6 mm which is within normal limits. Liver: 3.1 cm rounded hyperechoic abnormality is noted posteriorly in the right hepatic lobe which is unchanged compared to prior exam and most consistent with hemangioma. Within normal limits in  parenchymal echogenicity. Portal vein is patent on color Doppler imaging with normal direction of blood flow towards the liver. Other: None. IMPRESSION: Status post cholecystectomy. Stable 3.1 cm rounded hyperechoic

## 2021-05-20 NOTE — Consult Note (Signed)
?Referring Provider: No ref. provider found ?Primary Care Physician:  Claretta Fraise, MD ?Primary Gastroenterologist:  previously Dr. Fuller Plan, no current GI ? ?Date of Admission: 05/19/21 ?Date of Consultation: 05/20/21 ? ?Reason for Consultation:  Elevated LFTs ? ?HPI:  ?[name] is a 86 y.o. year old male with medical history of DM, HTN, OSA, A fib and CAD. Patient presented to ED on 4/3 with c/o Upper abdominal pain x4 days. Seen at urgent care then referred to [facility] ED where he was told LFTs were elevated, CT A/P w/o acute findings other than low-density liver lesion favoring hemangioma. Referred for [place] and advised to follow up with PCP, however, pain recurred early this morning and awoke him from sleep. Reports nausea, dry heaving and 1 episode of emesis. Denies fevers, chills. No reported ETOH use. S/p cholecystectomy in the past ? ?ED Course: ?VSS ?LFTs on admission: AST 135, ALT 228, ALk PHos 264, T bili 1.8 ?Fractionated bilirubin T bili 7.3, direct 4.2, indirect 3.1 ?Acute hepatitis panel non reactive ?Tylenol level WNL ?Lipase 33 ?[place] abd completed 4/3 with hyperechoic liver lesions compatible with known hepatic hemangiomas, non visualization of pancreas  ?RUQ [place] yesterday: s/p cholecystectomy, stable 3.1 cm rounded hyperechoic abnornmality seen in R hepatic lobe, posteriorly, most consistent with hemangioma. ? ?Consult:  ?R factor with indications of mixed hepatic injury.  ? ?States that epigastric pain started suddenly early Saturday morning, got up and used the restroom, then felt nauseated but could not vomit. Noted a Temp of 102 at that time. Went to [facility] via ems with labs and CT with only findings of elevated LFTs. Advised to follow up with PCP, pain did not improve, worse yesterday morning with associated nausea and vomiting. Denies diarrhea or constipation. No rectal bleeding or melena. No history of previous liver elevations. Was on a 5 days prednisone course a few months  back but no other new medications or antibiotics. He does note some previous R flank pain that last about 1 week but no longer having this. Denies any recent sick contacts. States that he felt fine until sudden onset of symptoms. He does take an herbal supplement (trexar) for nerve pain in his legs, has been taking this for about 1 year. Denies any alcohol use. Denies nausea today. Reports cholecystectomy was around 1997, states he went to the hospital for pancreatitis and ended up having his gallbladder removed. He reports that he was told pancreatitis was from elevated blood sugars. Denies any further episodes of pancreatitis. Patient does report that he found a tick on the back of his Left leg on Saturday night, unsure how long this had been there, noted two areas that looked like bites near the location of the tick. Denies any rashes. ? ? ?Past Medical History:  ?Diagnosis Date  ? Arthritis   ? knees, shoulder,  hips (10/24/2017)  ? Atrial fibrillation ([place])   ? on eliquis  ? BPH (benign prostatic hyperplasia)   ? Cataracts, bilateral   ? removed by surgery  ? Coronary artery disease excluded   ? Diabetes mellitus without complication ([place])   ? type 2  ? Diverticulosis   ? Endocarditis, valve unspecified, unspecified cause   ? GERD (gastroesophageal reflux disease)   ? H/O mitral valve repair   ? Postoperative ring with postoperative atrial fibrillation  ? Hearing loss   ? both ears - does not use hearing aids  ? Heart murmur   ? hx  ? Hiatal hernia 06/13/2002  ?  High cholesterol   ? History of kidney stones   ? passed spontaneously x2   ? OSA on CPAP   ? uses cpap nightly  ? Stroke [facility])   ? Unspecified essential hypertension   ? Weakness of both arms 07/19/2012  ? Wears dentures   ? upper and lower partial  ? ? ?Past Surgical History:  ?Procedure Laterality Date  ? CARDIAC CATHETERIZATION  02/14/2007  ? x 2  ? CARDIOVERSION N/A 04/14/2016  ? Procedure: CARDIOVERSION;  Surgeon: Minus Breeding, MD;  Location: [place];  Service: Cardiovascular;  Laterality: N/A;  ? CATARACT EXTRACTION W/PHACO Right 05/12/2015  ? Procedure: CATARACT EXTRACTION PHACO AND INTRAOCULAR LENS PLACEMENT RIGHT EYE CDE=7.75;  Surgeon: Tonny Branch, MD;  Location: [facility];  Service: Ophthalmology;  Laterality: Right;  ? CATARACT EXTRACTION W/PHACO Left 06/12/2015  ? Procedure: CATARACT EXTRACTION PHACO AND INTRAOCULAR LENS PLACEMENT (IOC);  Surgeon: Tonny Branch, MD;  Location: [facility];  Service: Ophthalmology;  Laterality: Left;  CDE:  13.17  ? COLONOSCOPY    ? EYE SURGERY    ? Bilateral cataracts  ? INJECTION KNEE Right 02/20/2016  ? Procedure: KNEE INJECTION;  Surgeon: [name], MD;  Location: [place];  Service: Orthopedics;  Laterality: Right;  ? JOINT REPLACEMENT    ? KNEE ARTHROSCOPY Left   ? LAPAROSCOPIC CHOLECYSTECTOMY    ? MITRAL VALVE REPAIR  ~ 2003  ? MULTIPLE TOOTH EXTRACTIONS    ? SHOULDER ARTHROSCOPY WITH ROTATOR CUFF REPAIR AND OPEN BICEPS TENODESIS Right 01/26/2019  ? Procedure: right shoulder arthroscopy, rotator cuff repair and biceps tenodesis;  Surgeon: [name], MD;  Location: [place];  Service: Orthopedics;  Laterality: Right;  ? SHOULDER OPEN ROTATOR CUFF REPAIR Left   ? TOTAL KNEE ARTHROPLASTY Left 02/20/2016  ? Procedure: LEFT TOTAL KNEE ARTHROPLASTY WITH RIGHT KNEE INJECTION;  Surgeon: [name], MD;  Location: [place];  Service: Orthopedics;  Laterality: Left;  ? TOTAL KNEE ARTHROPLASTY Right 07/06/2019  ? Procedure: RIGHT TOTAL KNEE ARTHROPLASTY;  Surgeon: [name], MD;  Location: [place];  Service: Orthopedics;  Laterality: Right;  ? UPPER GI ENDOSCOPY    ? VASECTOMY    ? ? ?Prior to Admission medications   ?Medication Sig Start Date End Date Taking? Authorizing Provider  ?amLODipine (NORVASC) 5 MG tablet Take 1 tablet (5 mg total) by mouth daily. 01/01/21  Yes Claretta Fraise, MD  ?apixaban (ELIQUIS) 5 MG TABS tablet Take 1 tablet (5 mg total) by mouth 2 (two) times daily. 01/01/21  Yes Claretta Fraise, MD  ?Calcium  Carb-Cholecalciferol (CALCIUM 600 + D PO) Take 1 tablet by mouth daily.   Yes [provider]  ?Coenzyme Q10 300 MG CAPS Take 300 mg by mouth daily.   Yes [provider]  ?dicyclomine (BENTYL) 20 MG tablet Take 20 mg by mouth 2 (two) times daily. 05/16/21  Yes [provider]  ?lisinopril (ZESTRIL) 20 MG tablet TAKE 1 TABLET BY MOUTH EVERYDAY AT BEDTIME ?Patient taking differently: Take 20 mg by mouth daily. 04/06/21  Yes Claretta Fraise, MD  ?meclizine (ANTIVERT) 25 MG tablet Take 25 mg by mouth 3 (three) times daily as needed for dizziness. 08/08/20  Yes [provider]  ?metFORMIN (GLUCOPHAGE-XR) 500 MG 24 hr tablet TAKE 1 TABLET BY MOUTH EVERY DAY WITH BREAKFAST ?Patient taking differently: Take 500 mg by mouth daily with breakfast. 01/01/21  Yes Stacks, Cletus Gash, MD  ?Multiple Vitamin (MULTIVITAMIN WITH MINERALS) TABS tablet Take 1 tablet by mouth  daily.   Yes [provider]  ?omeprazole (PRILOSEC) 40 MG capsule Take 1 capsule (40 mg total) by mouth daily. 01/01/21  Yes Stacks, Warren, MD  ?ondansetron (ZOFRAN-ODT) 4 MG disintegrating tablet Take 4 mg by mouth every 8 (eight) hours as needed for nausea. 05/16/21  Yes [provider]  ?rosuvastatin (CRESTOR) 5 MG tablet Take 1 tablet (5 mg total) by mouth daily. For cholesterol 01/01/21  Yes Stacks, Warren, MD  ?silodosin (RAPAFLO) 4 MG CAPS capsule Take 1 capsule (4 mg total) by mouth daily with breakfast. 01/01/21  Yes Stacks, Warren, MD  ? ? ?Current Facility-Administered Medications  ?Medication Dose Route Frequency Provider Last Rate Last Admin  ? acetaminophen (TYLENOL) tablet 650 mg  650 mg Oral Q6H PRN Emokpae, Ejiroghene E, MD      ? Or  ? acetaminophen (TYLENOL) suppository 650 mg  650 mg Rectal Q6H PRN Emokpae, Ejiroghene E, MD      ? amLODipine (NORVASC) tablet 5 mg  5 mg Oral Daily Emokpae, Ejiroghene E, MD      ? insulin aspart (novoLOG) injection 0-9 Units  0-9 Units Subcutaneous Q8H Emokpae,  Ejiroghene E, MD   1 Units at 05/19/21 2115  ? lactated ringers infusion   Intravenous Continuous Emokpae, Ejiroghene E, MD 75 mL/hr at 05/20/21 0559 New Bag at 05/20/21 0559  ? lisinopril (ZESTRIL) tablet 20 mg  20 mg Ora

## 2021-05-20 NOTE — Progress Notes (Signed)
Obtained consent for patients procedure tomorrow 05/21/21, consent placed in patients chart.  ?

## 2021-05-21 ENCOUNTER — Inpatient Hospital Stay (HOSPITAL_COMMUNITY): Payer: Medicare Other | Admitting: Anesthesiology

## 2021-05-21 ENCOUNTER — Inpatient Hospital Stay (HOSPITAL_COMMUNITY): Payer: Medicare Other

## 2021-05-21 ENCOUNTER — Encounter (HOSPITAL_COMMUNITY)
Admission: EM | Disposition: A | Discharge status: Home / Self Care | Payer: Self-pay | Source: Home / Self Care | Attending: Internal Medicine

## 2021-05-21 DIAGNOSIS — R748 Abnormal levels of other serum enzymes: Secondary | ICD-10-CM | POA: Diagnosis not present

## 2021-05-21 DIAGNOSIS — K8050 Calculus of bile duct without cholangitis or cholecystitis without obstruction: Secondary | ICD-10-CM

## 2021-05-21 HISTORY — PX: REMOVAL OF STONES: SHX5545

## 2021-05-21 HISTORY — PX: ESOPHAGOGASTRODUODENOSCOPY: SHX5428

## 2021-05-21 HISTORY — PX: ESOPHAGEAL DILATION: SHX303

## 2021-05-21 HISTORY — PX: MALONEY DILATION: SHX5535

## 2021-05-21 HISTORY — PX: SPHINCTEROTOMY: SHX5544

## 2021-05-21 HISTORY — PX: ERCP: SHX5425

## 2021-05-21 LAB — COMPREHENSIVE METABOLIC PANEL
ALT: 149 U/L — ABNORMAL HIGH (ref 0–44)
AST: 65 U/L — ABNORMAL HIGH (ref 15–41)
Albumin: 3.9 g/dL (ref 3.5–5.0)
Alkaline Phosphatase: 286 U/L — ABNORMAL HIGH (ref 38–126)
Anion gap: 10 (ref 5–15)
BUN: 10 mg/dL (ref 8–23)
CO2: 28 mmol/L (ref 22–32)
Calcium: 9.3 mg/dL (ref 8.9–10.3)
Chloride: 105 mmol/L (ref 98–111)
Creatinine, Ser: 0.58 mg/dL — ABNORMAL LOW (ref 0.61–1.24)
GFR, Estimated: >60 mL/min (ref >60)
Glucose, Bld: 126 mg/dL — ABNORMAL HIGH (ref 70–99)
Potassium: 4.3 mmol/L (ref 3.5–5.1)
Sodium: 143 mmol/L (ref 135–145)
Total Bilirubin: 4.4 mg/dL — ABNORMAL HIGH (ref 0.3–1.2)
Total Protein: 7.7 g/dL (ref 6.5–8.1)

## 2021-05-21 LAB — CBC
HCT: 45.2 % (ref 39.0–52.0)
Hemoglobin: 14.7 g/dL (ref 13.0–17.0)
MCH: 31.6 pg (ref 26.0–34.0)
MCHC: 32.5 g/dL (ref 30.0–36.0)
MCV: 97.2 fL (ref 80.0–100.0)
Platelets: 233 10*3/uL (ref 150–400)
RBC: 4.65 MIL/uL (ref 4.22–5.81)
RDW: 14 % (ref 11.5–15.5)
WBC: 6 10*3/uL (ref 4.0–10.5)
nRBC: 0 % (ref 0.0–0.2)

## 2021-05-21 LAB — GLUCOSE, CAPILLARY
Glucose-Capillary: 111 mg/dL — ABNORMAL HIGH (ref 70–99)
Glucose-Capillary: 121 mg/dL — ABNORMAL HIGH (ref 70–99)
Glucose-Capillary: 136 mg/dL — ABNORMAL HIGH (ref 70–99)
Glucose-Capillary: 159 mg/dL — ABNORMAL HIGH (ref 70–99)
Glucose-Capillary: 97 mg/dL (ref 70–99)

## 2021-05-21 SURGERY — EGD (ESOPHAGOGASTRODUODENOSCOPY)
Anesthesia: General

## 2021-05-21 MED ORDER — LIDOCAINE HCL (PF) 2 % IJ SOLN
INTRAMUSCULAR | Status: AC
  Filled 2021-05-21: qty 5

## 2021-05-21 MED ORDER — SODIUM CHLORIDE 0.9 % IV SOLN: INTRAVENOUS | Status: DC

## 2021-05-21 MED ORDER — STERILE WATER FOR IRRIGATION IR SOLN
Status: DC | PRN
  Administered 2021-05-21: 1000 mL

## 2021-05-21 MED ORDER — ONDANSETRON HCL 4 MG/2ML IJ SOLN
INTRAMUSCULAR | Status: AC
  Filled 2021-05-21: qty 2

## 2021-05-21 MED ORDER — SUCCINYLCHOLINE CHLORIDE 200 MG/10ML IV SOSY
PREFILLED_SYRINGE | INTRAVENOUS | Status: AC
  Filled 2021-05-21: qty 10

## 2021-05-21 MED ORDER — DEXAMETHASONE SODIUM PHOSPHATE 10 MG/ML IJ SOLN
INTRAMUSCULAR | Status: DC | PRN
  Administered 2021-05-21: 10 mg via INTRAVENOUS

## 2021-05-21 MED ORDER — PROPOFOL 10 MG/ML IV BOLUS
INTRAVENOUS | Status: DC | PRN
  Administered 2021-05-21: 200 mg via INTRAVENOUS

## 2021-05-21 MED ORDER — FENTANYL CITRATE (PF) 100 MCG/2ML IJ SOLN
INTRAMUSCULAR | Status: DC | PRN
  Administered 2021-05-21 (×2): 50 ug via INTRAVENOUS

## 2021-05-21 MED ORDER — LACTATED RINGERS IV SOLN: INTRAVENOUS | Status: DC

## 2021-05-21 MED ORDER — SODIUM CHLORIDE 0.9 % IV SOLN
INTRAVENOUS | Status: AC
  Filled 2021-05-21: qty 50

## 2021-05-21 MED ORDER — GLUCAGON HCL RDNA (DIAGNOSTIC) 1 MG IJ SOLR
INTRAMUSCULAR | Status: DC | PRN
  Administered 2021-05-21: .25 mg via INTRAVENOUS

## 2021-05-21 MED ORDER — LIDOCAINE HCL (CARDIAC) PF 100 MG/5ML IV SOSY
PREFILLED_SYRINGE | INTRAVENOUS | Status: DC | PRN
  Administered 2021-05-21: 100 mg via INTRATRACHEAL

## 2021-05-21 MED ORDER — ONDANSETRON HCL 4 MG/2ML IJ SOLN
INTRAMUSCULAR | Status: DC | PRN
  Administered 2021-05-21: 4 mg via INTRAVENOUS

## 2021-05-21 MED ORDER — ESMOLOL HCL 100 MG/10ML IV SOLN
INTRAVENOUS | Status: DC | PRN
  Administered 2021-05-21: 30 mg via INTRAVENOUS

## 2021-05-21 MED ORDER — ROCURONIUM BROMIDE 10 MG/ML (PF) SYRINGE
PREFILLED_SYRINGE | INTRAVENOUS | Status: DC | PRN
  Administered 2021-05-21: 50 mg via INTRAVENOUS

## 2021-05-21 MED ORDER — ORAL CARE MOUTH RINSE: 15.0000 mL | Freq: Once | OROMUCOSAL | Status: AC

## 2021-05-21 MED ORDER — CHLORHEXIDINE GLUCONATE 0.12 % MT SOLN
15.0000 mL | Freq: Once | OROMUCOSAL | Status: AC
  Administered 2021-05-21: 15 mL via OROMUCOSAL

## 2021-05-21 MED ORDER — ROCURONIUM BROMIDE 10 MG/ML (PF) SYRINGE
PREFILLED_SYRINGE | INTRAVENOUS | Status: AC
  Filled 2021-05-21: qty 10

## 2021-05-21 MED ORDER — FENTANYL CITRATE (PF) 100 MCG/2ML IJ SOLN
INTRAMUSCULAR | Status: AC
Start: 2021-05-21 — End: ?
  Filled 2021-05-21: qty 2

## 2021-05-21 MED ORDER — DEXAMETHASONE SODIUM PHOSPHATE 10 MG/ML IJ SOLN
INTRAMUSCULAR | Status: AC
  Filled 2021-05-21: qty 1

## 2021-05-21 MED ORDER — CHLORHEXIDINE GLUCONATE 0.12 % MT SOLN
OROMUCOSAL | Status: AC
  Filled 2021-05-21: qty 15

## 2021-05-21 MED ORDER — SUGAMMADEX SODIUM 200 MG/2ML IV SOLN
INTRAVENOUS | Status: DC | PRN
  Administered 2021-05-21: 400 mg via INTRAVENOUS

## 2021-05-21 MED ORDER — HYDROMORPHONE HCL 1 MG/ML IJ SOLN: 0.2500 mg | INTRAMUSCULAR | Status: DC | PRN

## 2021-05-21 MED ORDER — SODIUM CHLORIDE 0.9 % IV SOLN
INTRAVENOUS | Status: DC | PRN
  Administered 2021-05-21 (×2): 10 mL

## 2021-05-21 MED ORDER — PROPOFOL 10 MG/ML IV BOLUS
INTRAVENOUS | Status: AC
  Filled 2021-05-21: qty 20

## 2021-05-21 MED ORDER — PHENYLEPHRINE 40 MCG/ML (10ML) SYRINGE FOR IV PUSH (FOR BLOOD PRESSURE SUPPORT)
PREFILLED_SYRINGE | INTRAVENOUS | Status: DC | PRN
  Administered 2021-05-21 (×2): 40 ug via INTRAVENOUS

## 2021-05-21 MED ORDER — GLUCAGON HCL RDNA (DIAGNOSTIC) 1 MG IJ SOLR
INTRAMUSCULAR | Status: AC
  Filled 2021-05-21: qty 2

## 2021-05-21 MED ORDER — SUCCINYLCHOLINE CHLORIDE 200 MG/10ML IV SOSY
PREFILLED_SYRINGE | INTRAVENOUS | Status: DC | PRN
  Administered 2021-05-21: 140 mg via INTRAVENOUS

## 2021-05-21 SURGICAL SUPPLY — 41 items
BALLN RETRIEVAL 12X15 (BALLOONS) IMPLANT
BALN RTRVL 200 6-7FR 12-15 (BALLOONS)
BASKET TRAPEZOID 3X6 (MISCELLANEOUS) IMPLANT
BASKET TRAPEZOID LITHO 2.0X5 (MISCELLANEOUS) ×2 IMPLANT
BLOCK BITE 60FR ADLT L/F BLUE (MISCELLANEOUS) ×2 IMPLANT
BSKT STON RTRVL TRAPEZOID 2X5 (MISCELLANEOUS)
BSKT STON RTRVL TRAPEZOID 3X6 (MISCELLANEOUS)
DEVICE INFLATION ENCORE 26 (MISCELLANEOUS) IMPLANT
DEVICE LOCKING W-BIOPSY CAP (MISCELLANEOUS) ×2 IMPLANT
ELECT REM PT RETURN 9FT ADLT (ELECTROSURGICAL)
ELECTRODE REM PT RTRN 9FT ADLT (ELECTROSURGICAL) IMPLANT
FLOOR PAD 36X40 (MISCELLANEOUS)
FORCEP COLD BIOPSY (CUTTING FORCEPS) IMPLANT
FORCEP RJ3 GP 1.8X160 W-NEEDLE (CUTTING FORCEPS) IMPLANT
FORCEPS BIOP RAD 4 LRG CAP 4 (CUTTING FORCEPS) IMPLANT
GUIDEWIRE HYDRA JAGWIRE .35 (WIRE) IMPLANT
GUIDEWIRE JAG HINI 025X260CM (WIRE) IMPLANT
KIT ENDO PROCEDURE PEN (KITS) ×2 IMPLANT
KIT TURNOVER KIT A (KITS) ×2 IMPLANT
LUBRICANT JELLY 4.5OZ STERILE (MISCELLANEOUS) IMPLANT
MANIFOLD NEPTUNE II (INSTRUMENTS) IMPLANT
NDL SCLEROTHERAPY 25GX240 (NEEDLE) IMPLANT
NEEDLE SCLEROTHERAPY 25GX240 (NEEDLE) IMPLANT
PAD ARMBOARD 7.5X6 YLW CONV (MISCELLANEOUS) ×2 IMPLANT
PAD FLOOR 36X40 (MISCELLANEOUS) IMPLANT
POSITIONER HEAD 8X9X4 ADT (SOFTGOODS) IMPLANT
PROBE APC STR FIRE (PROBE) IMPLANT
PROBE INJECTION GOLD (MISCELLANEOUS)
PROBE INJECTION GOLD 7FR (MISCELLANEOUS) IMPLANT
SCOPE SPY DS DISPOSABLE (MISCELLANEOUS) ×2 IMPLANT
SNARE ROTATE MED OVAL 20MM (MISCELLANEOUS) IMPLANT
SNARE SHORT THROW 13M SML OVAL (MISCELLANEOUS) IMPLANT
SPHINCTEROTOME AUTOTOME .25 (MISCELLANEOUS) IMPLANT
SPHINCTEROTOME HYDRATOME 44 (MISCELLANEOUS) ×2 IMPLANT
SYR 50ML LL SCALE MARK (SYRINGE) IMPLANT
SYSTEM CONTINUOUS INJECTION (MISCELLANEOUS) ×2 IMPLANT
TUBING INSUFFLATOR CO2MPACT (TUBING) ×2 IMPLANT
TUBING IRRIGATION ENDOGATOR (MISCELLANEOUS) ×2 IMPLANT
WALLSTENT METAL COVERED 10X60 (STENTS) IMPLANT
WALLSTENT METAL COVERED 10X80 (STENTS) IMPLANT
WATER STERILE IRR 1000ML POUR (IV SOLUTION) ×2 IMPLANT

## 2021-05-21 NOTE — Anesthesia Preprocedure Evaluation (Addendum)
Anesthesia Evaluation  ?Patient identified by MRN, date of birth, ID band ?Patient awake ? ? ? ?Reviewed: ?Allergy & Precautions, NPO status , Patient's Chart, lab work & pertinent test results ? ?Airway ?Mallampati: II ? ?TM Distance: >3 FB ?Neck ROM: Full ? ? ? Dental ? ?(+) Dental Advisory Given, Teeth Intact ?  ?Pulmonary ?sleep apnea , former smoker,  ?  ?Pulmonary exam normal ?breath sounds clear to auscultation ? ? ? ? ? ? Cardiovascular ?Exercise Tolerance: Good ?hypertension, Pt. on medications ?+ CAD  ?+ dysrhythmias Atrial Fibrillation + Valvular Problems/Murmurs (mitral valve repair)  ?Rhythm:Irregular Rate:Normal ? ??1. Left ventricular ejection fraction, by estimation, is 55 to 60%. The  ?left ventricle has normal function. The left ventricle has no regional  ?wall motion abnormalities. There is mild left ventricular hypertrophy.  ?Left ventricular diastolic function  ?could not be evaluated. Elevated left atrial pressure.  ??2. Right ventricular systolic function is normal. The right ventricular  ?size is normal. The estimated right ventricular systolic pressure is 62.8  ?mmHg.  ??3. Left atrial size was moderately dilated.  ??4. The mitral valve is abnormal. Trivial mitral valve regurgitation.  ?borderline mitral stenosis. The mean mitral valve gradient is 4.0 mmHg.  ?Moderate mitral annular calcification.  ??5. The aortic valve is tricuspid. Aortic valve regurgitation is not  ?visualized.  ??6. Aortic dilatation noted. There is borderline dilatation of the aortic  ?root, measuring 39 mm.  ??7. The inferior vena cava is dilated in size with <50% respiratory  ?variability, suggesting right atrial pressure of 15 mmHg.  ?  ?Neuro/Psych ?TIA Neuromuscular disease CVA   ? GI/Hepatic ?hiatal hernia, GERD  Medicated,  ?Endo/Other  ?diabetes, Well Controlled, Type 2, Oral Hypoglycemic Agents ? Renal/GU ?  ? ?  ?Musculoskeletal ? ?(+) Arthritis , Osteoarthritis,   ?  Abdominal ?  ?Peds ? Hematology ?  ?Anesthesia Other Findings ? ? Reproductive/Obstetrics ? ?  ? ? ? ? ? ? ? ? ? ? ? ? ? ?  ?  ? ? ? ? ? ? ?Anesthesia Physical ?Anesthesia Plan ? ?ASA: 3 ? ?Anesthesia Plan: General  ? ?Post-op Pain Management: Dilaudid IV  ? ?Induction: Intravenous ? ?PONV Risk Score and Plan: 3 and Ondansetron and Dexamethasone ? ?Airway Management Planned: Oral ETT ? ?Additional Equipment:  ? ?Intra-op Plan:  ? ?Post-operative Plan: Extubation in OR ? ?Informed Consent: I have reviewed the patients History and Physical, chart, labs and discussed the procedure including the risks, benefits and alternatives for the proposed anesthesia with the patient or authorized representative who has indicated his/her understanding and acceptance.  ? ? ? ? ? ?Plan Discussed with: Surgeon ? ?Anesthesia Plan Comments:   ? ? ? ? ? ? ?Anesthesia Quick Evaluation ? ?

## 2021-05-21 NOTE — TOC Progression Note (Signed)
?  Transition of Care (TOC) Screening Note ? ? ?Patient Details  ?Name: [name] ?Date of Birth: 11-17-1935 ? ? ?Transition of Care (TOC) CM/SW Contact:    ?Shade Flood, LCSW ?Phone Number: ?05/21/2021, 10:39 AM ? ? ? ?Transition of Care Department [facility]) has reviewed patient and no TOC needs have been identified at this time. We will continue to monitor patient advancement through interdisciplinary progression rounds. If new patient transition needs arise, please place a TOC consult. ? ? ?

## 2021-05-21 NOTE — Progress Notes (Signed)
?Subjective: ? ?Patient denies chest pain shortness of breath or abdominal pain.  He says he passed chalky stool.  Urine is yellow but color improving.  He is hungry.  He has not had fever or chills since he was last seen. ? ?Current Medications: ? ?Current Facility-Administered Medications:  ?  chlorhexidine (PERIDEX) 0.12 % solution, , , ,  ?  0.9 %  sodium chloride infusion, , Intravenous, Continuous, Jonai Weyland U, MD ?  [MAR Hold] acetaminophen (TYLENOL) tablet 650 mg, 650 mg, Oral, Q6H PRN **OR** [MAR Hold] acetaminophen (TYLENOL) suppository 650 mg, 650 mg, Rectal, Q6H PRN, Emokpae, Ejiroghene E, MD ?  [MAR Hold] amLODipine (NORVASC) tablet 5 mg, 5 mg, Oral, Daily, Emokpae, Ejiroghene E, MD, 5 mg at 05/21/21 0921 ?  [MAR Hold] cefTRIAXone (ROCEPHIN) 2 g in sodium chloride 0.9 % 100 mL IVPB, 2 g, Intravenous, Q2000, Serafina Topham U, MD, Last Rate: 200 mL/hr at 05/20/21 1904, 2 g at 05/20/21 1904 ?  [MAR Hold] insulin aspart (novoLOG) injection 0-9 Units, 0-9 Units, Subcutaneous, Q8H, Emokpae, Ejiroghene E, MD, 1 Units at 05/21/21 0604 ?  lactated ringers infusion, , Intravenous, Continuous, Battula, Rajamani C, MD, Last Rate: 50 mL/hr at 05/21/21 1630, New Bag at 05/21/21 1630 ?  [MAR Hold] lisinopril (ZESTRIL) tablet 20 mg, 20 mg, Oral, Daily, Emokpae, Ejiroghene E, MD, 20 mg at 05/20/21 2220 ?  [MAR Hold] morphine (PF) 2 MG/ML injection 2 mg, 2 mg, Intravenous, Q4H PRN, Emokpae, Ejiroghene E, MD ?  [MAR Hold] ondansetron (ZOFRAN) tablet 4 mg, 4 mg, Oral, Q6H PRN **OR** [MAR Hold] ondansetron (ZOFRAN) injection 4 mg, 4 mg, Intravenous, Q6H PRN, Emokpae, Ejiroghene E, MD ?  [MAR Hold] pantoprazole (PROTONIX) injection 40 mg, 40 mg, Intravenous, QAC breakfast, Carlan, Chelsea L, NP, 40 mg at 05/21/21 3220 ?  sodium chloride 0.9 % infusion, , , ,  ? ? ?Objective: ?Blood pressure 135/86, pulse (!) 58, temperature 98 ?F (36.7 ?C), resp. rate (!) 25, height '5\' 11"'  (1.803 m), weight 96 kg, SpO2 99 %. ?Patient  is alert and in no acute distress. ?Sclera is mildly icteric. ?Cardiac exam with irregular rhythm normal S1 and S2. No murmur or gallop noted. ?Lungs are clear to auscultation. ?Abdomen is symmetrical soft and nontender with organomegaly or masses. ?No LE edema or clubbing noted. ? ?Labs/studies Results: ? ? ? ?  Latest Ref Rng & Units 05/21/2021  ?  5:49 AM 05/20/2021  ?  5:20 AM 05/19/2021  ?  2:10 PM  ?CBC  ?WBC 4.0 - 10.5 K/uL 6.0   5.7   9.3    ?Hemoglobin 13.0 - 17.0 g/dL 14.7   13.4   14.1    ?Hematocrit 39.0 - 52.0 % 45.2   41.6   43.2    ?Platelets 150 - 400 K/uL 233   216   217    ?  ? ?  Latest Ref Rng & Units 05/21/2021  ?  5:49 AM 05/20/2021  ?  5:20 AM 05/19/2021  ?  6:18 PM  ?CMP  ?Glucose 70 - 99 mg/dL 126   142     ?BUN 8 - 23 mg/dL 10   11     ?Creatinine 0.61 - 1.24 mg/dL 0.58   0.54     ?Sodium 135 - 145 mmol/L 143   139     ?Potassium 3.5 - 5.1 mmol/L 4.3   4.2     ?Chloride 98 - 111 mmol/L 105   105     ?  CO2 22 - 32 mmol/L 28   27     ?Calcium 8.9 - 10.3 mg/dL 9.3   8.6     ?Total Protein 6.5 - 8.1 g/dL 7.7   7.2     ?Total Bilirubin 0.3 - 1.2 mg/dL 4.4   6.8   7.3    ?Alkaline Phos 38 - 126 U/L 286   256     ?AST 15 - 41 U/L 65   104     ?ALT 0 - 44 U/L 149   183     ?  ? ?  Latest Ref Rng & Units 05/21/2021  ?  5:49 AM 05/20/2021  ?  5:20 AM 05/19/2021  ?  6:18 PM  ?Hepatic Function  ?Total Protein 6.5 - 8.1 g/dL 7.7   7.2     ?Albumin 3.5 - 5.0 g/dL 3.9   3.8     ?AST 15 - 41 U/L 65   104     ?ALT 0 - 44 U/L 149   183     ?Alk Phosphatase 38 - 126 U/L 286   256     ?Total Bilirubin 0.3 - 1.2 mg/dL 4.4   6.8   7.3    ?Bilirubin, Direct 0.0 - 0.2 mg/dL   4.2    ?  ? ?Assessment: ? ?#1.  Choledocholithiasis.  Patient remained stable.  Bilirubin and transaminases are trending down.  Patient stable therapeutic ERCP. ?Since he has history of fever he was begun on ceftriaxone yesterday. ? ?#2.  Esophageal dysphagia.  He will undergo EGD with dilation prior to ERCP. ? ?#3.  Atrial fibrillation.  Anticoagulant on hold  for the procedure. ? ?#4.  Diabetes mellitus.  Blood glucose levels within desirable range.  Metformin on hold. ? ?#5.  History of mitral valve repair.  Echo in September 2022 revealed EF of 55 to 60%. ? ? ?Plan: ? ?Proceed with esophagogastroduodenoscopy with esophageal dilation followed by ERCP with sphincterotomy and stone extraction. ?Patient is agreeable. ? ? ? ? ? ? ?

## 2021-05-21 NOTE — Progress Notes (Signed)
?PROGRESS NOTE ? ? ? ?[name]  OQH:[id number] DOB: 27-Nov-1935 DOA: 05/19/2021 ?PCP: Claretta Fraise, MD ? ? ?Brief Narrative:  ?[name] is a 86 y.o. male with medical history significant for DM, HTN, OSA, atrial fibrillation, CAD. ?Patient presented to the ED with complaints of upper abdominal pain that started 4 days ago.  He went to urgent care and was referred to [facility], where he was told his liver enzymes were elevated, CT abdomen and pelvis was without acute findings, low-density lesions favoring hemangiomas. He was referred for an ultrasound, told to follow-up with his primary care doctor.  Upon arriving home patient had worsening abdominal pain, intractable with episodes of dry heaving, vomiting after taking p.o. at home.  Notable history of cholecystectomy. ? ?Assessment & Plan: ?  ?Principal Problem: ?  Elevated liver enzymes ?Active Problems: ?  Type 2 diabetes mellitus with diabetic neuropathy, unspecified ([place]) ?  Essential hypertension ?  ATRIAL FIBRILLATION ?  Sleep apnea ?  H/O mitral valve repair ?  Epigastric pain ?  Nausea and vomiting ? ?Obstructive choledocholithiasis, POA ?Concurrent intractable abdominal pain, nausea, vomiting, POA ?Elevated liver enzymes ?Lab Results  ?Component Value Date  ? ALT 149 (H) 05/21/2021  ? AST 65 (H) 05/21/2021  ? ALKPHOS 286 (H) 05/21/2021  ? BILITOT 4.4 (H) 05/21/2021  ?GI following, appreciate insight recommendations ?MRCP demonstrated choledocholithiasis --> plan for ERCP per GI later today ?Status post cholecystectomy 1996 ?  ?Type 2 diabetes mellitus with diabetic neuropathy, unspecified ([place]) ?HgbA1c 6.2 ?Hold metformin ?Continue sliding scale insulin, hypoglycemic protocol ? ?Obstructive sleep apnea ?Resume CPAP, reports compliance at home ?  ?Atrial fibrillation, rate controlled ?No rate control medication on home med rec, borderline bradycardic ?On Eliquis chronically, currently on hold due to possible need for intervention or  procedure as above ?  ?Essential hypertension ?Continue amlodipine/lisinopril ?  ?DVT prophylaxis: SCDs -holding home Eliquis as above ?Code Status: Full ?Family Communication: Wife at bedside ? ?Status is: Inpatient ? ?Dispo: The patient is from: Home ?             Anticipated d/c is to: Home ?             Anticipated d/c date is: 24 to 48 hours ?             Patient currently not medically stable for discharge ? ?Consultants:  ?GI ? ?Procedures:  ?MRCP ? ?Antimicrobials:  ?None ? ?Subjective: ?No acute issues or events overnight, abdominal pain improving while fasting but not completely resolved.  Otherwise denies headache, fever, chills, chest pain, shortness of breath, diarrhea, constipation. ? ?Objective: ?Vitals:  ? 05/20/21 2218 05/20/21 2240 05/21/21 5465 05/21/21 0558  ?BP: (!) 151/82   (!) 143/76  ?Pulse: 74 (!) 44 62 (!) 55  ?Resp: '16 16 17   '$ ?Temp:   97.8 ?F (36.6 ?C)   ?TempSrc:   Oral   ?SpO2: 94% 97% 99% 98%  ?Weight:      ?Height:      ? ? ?Intake/Output Summary (Last 24 hours) at 05/21/2021 0725 ?Last data filed at 05/21/2021 0300 ?Gross per 24 hour  ?Intake 1160.33 ml  ?Output --  ?Net 1160.33 ml  ? ? ?Filed Weights  ? 05/19/21 1336  ?Weight: 96 kg  ? ? ?Examination: ? ?General exam: Appears calm and comfortable  ?Respiratory system: Clear to auscultation. Respiratory effort normal. ?Cardiovascular system: S1 & S2 heard, RRR. No JVD, murmurs, rubs, gallops or clicks. No pedal edema. ?Gastrointestinal  system: Abdomen is nondistended, soft and nontender. No organomegaly or masses felt. Normal bowel sounds heard. ?Central nervous system: Alert and oriented. No focal neurological deficits. ?Extremities: Symmetric 5 x 5 power. ?Skin: No rashes, lesions or ulcers ?Psychiatry: Judgement and insight appear normal. Mood & affect appropriate.  ? ? ? ?Data Reviewed: I have personally reviewed following labs and imaging studies ? ?CBC: ?Recent Labs  ?Lab 05/18/21 ?0945 05/19/21 ?1410 05/20/21 ?9935 05/21/21 ?7017   ?WBC 5.9 9.3 5.7 6.0  ?NEUTROABS 3.5  --   --   --   ?HGB 14.1 14.1 13.4 14.7  ?HCT 41.2 43.2 41.6 45.2  ?MCV 93 97.5 97.0 97.2  ?PLT 211 217 216 233  ? ? ?Basic Metabolic Panel: ?Recent Labs  ?Lab 05/18/21 ?0945 05/19/21 ?1410 05/20/21 ?7939 05/21/21 ?0300  ?NA 139 136 139 143  ?K 3.8 3.6 4.2 4.3  ?CL 99 102 105 105  ?CO2 '26 24 27 28  '$ ?GLUCOSE 133* 135* 142* 126*  ?BUN '12 14 11 10  '$ ?CREATININE 0.73* 0.52* 0.54* 0.58*  ?CALCIUM 9.1 8.9 8.6* 9.3  ? ? ?GFR: ?Estimated Creatinine Clearance: 82.7 mL/min (A) (by C-G formula based on SCr of 0.58 mg/dL (L)). ?Liver Function Tests: ?Recent Labs  ?Lab 05/18/21 ?0945 05/19/21 ?1410 05/19/21 ?1818 05/20/21 ?9233 05/21/21 ?0076  ?AST 135* 160*  --  104* 65*  ?ALT 228* 229*  --  183* 149*  ?ALKPHOS 264* 264*  --  256* 286*  ?BILITOT 1.8* 6.5* 7.3* 6.8* 4.4*  ?PROT 7.0 7.4  --  7.2 7.7  ?ALBUMIN 4.5 4.0  --  3.8 3.9  ? ? ?Recent Labs  ?Lab 05/18/21 ?0945 05/19/21 ?1410  ?LIPASE 22 33  ? ? ?No results for input(s): AMMONIA in the last 168 hours. ?Coagulation Profile: ?No results for input(s): INR, PROTIME in the last 168 hours. ?Cardiac Enzymes: ?No results for input(s): CKTOTAL, CKMB, CKMBINDEX, TROPONINI in the last 168 hours. ?BNP (last 3 results) ?No results for input(s): PROBNP in the last 8760 hours. ?HbA1C: ?No results for input(s): HGBA1C in the last 72 hours. ?CBG: ?Recent Labs  ?Lab 05/20/21 ?0609 05/20/21 ?1408 05/20/21 ?2210 05/21/21 ?0021 05/21/21 ?0600  ?GLUCAP 119* 103* 118* 111* 121*  ? ? ?Lipid Profile: ?No results for input(s): CHOL, HDL, LDLCALC, TRIG, CHOLHDL, LDLDIRECT in the last 72 hours. ?Thyroid Function Tests: ?No results for input(s): TSH, T4TOTAL, FREET4, T3FREE, THYROIDAB in the last 72 hours. ?Anemia Panel: ?No results for input(s): VITAMINB12, FOLATE, FERRITIN, TIBC, IRON, RETICCTPCT in the last 72 hours. ?Sepsis Labs: ?No results for input(s): PROCALCITON, LATICACIDVEN in the last 168 hours. ? ?No results found for this or any previous visit (from  the past 240 hour(s)).  ? ? ? ? ? ?Radiology Studies: ?MR 3D Recon At Scanner ? ?Result Date: 05/20/2021 ?CLINICAL DATA:  choledocolithiasis, high suspicion for biliary obstruction due to hyperbilirubinemia and abdominal pain. ^64m GADAVIST GADOBUTROL 1 MMOL/ML IV SOLNELevated LFTs, s/p previous cholecystectomy EXAM: MRI ABDOMEN WITHOUT AND WITH CONTRAST (INCLUDING MRCP) TECHNIQUE: Multiplanar multisequence MR imaging of the abdomen was performed both before and after the administration of intravenous contrast. Heavily T2-weighted images of the biliary and pancreatic ducts were obtained, and three-dimensional MRCP images were rendered by post processing. CONTRAST:  166mGADAVIST GADOBUTROL 1 MMOL/ML IV SOLN COMPARISON:  Ultrasound 05/19/2021 FINDINGS: Lower chest:  Lung bases are clear. Hepatobiliary: Postcholecystectomy. Cystic duct remnant is prominent. The common hepatic duct measures 8 mm. The common bile duct measures 6 mm. There are 3 filling defect within  distal common bile duct measuring approximately 5 mm each (image 20/series 3). The most distal filling defect is at the ampulla measuring 6 mm on image 21/3. Minimal intrahepatic duct dilatation. There are several lobular lesions within the RIGHT hepatic lobe which are high signal intensity on T2 weighted imaging typical of benign hemangiomas. (Series 4). These lesions demonstrate typical postcontrast benign hemangioma enhancement pattern (series 20) Pancreas: Normal pancreatic parenchymal intensity. No ductal dilatation or inflammation. Spleen: Normal spleen. Adrenals/urinary tract: Adrenal glands normal. Multiple nonenhancing Bosniak 1 renal cysts. Cysts of the LEFT and RIGHT kidney. Stomach/Bowel: Stomach and limited of the small bowel is unremarkable Vascular/Lymphatic: Abdominal aortic normal caliber. No retroperitoneal periportal lymphadenopathy. Musculoskeletal: No aggressive osseous lesion IMPRESSION: 1. Choledocholithiasis with three stones in the  distal common bile duct. Mild extrahepatic biliary duct dilatation. 2. Benign hemangioma in the RIGHT hepatic lobe. 3. Bilateral benign renal cysts (Bosniak 1). No follow-up recommended. Electronically Signed

## 2021-05-21 NOTE — Progress Notes (Signed)
Brief EGD and ERCP notes ? ? ?EGD. ? ?Normal mucosa of the proximal esophagus.  Short segment of salmon-colored mucosa distal esophagus suspicious for Barrett's epithelium.  Biopsy not taken. ?No stricture noted.  Esophagus dilated by passing 54 French Maloney dilator but no mucosal disruption noted. ?Normal examined stomach first and second part of duodenum. ?Prominent ampulla of Vater ? ? ?ERCP. ? ?Prominent ampulla of Vater. ?CBD cannulated easily with Rx 44 autotome and 035 Hydra Jagwire. ?Mildly dilated CBD and CHD with 2 filling defects.  Longer cystic duct remnant. ?Intrahepatic biliary radicles filled partially. ?Pancreatic duct not cannulated or filled with contrast. ?Biliary sphincterotomy performed. ?Largest stone removed with Damia basket and small stone removed with stone balloon extractor. ? ?No complications on sphincterotomy bleed noted. ? ? ?

## 2021-05-21 NOTE — Anesthesia Postprocedure Evaluation (Signed)
Anesthesia Post Note ? ?Patient: [name] ? ?Procedure(s) Performed: ESOPHAGOGASTRODUODENOSCOPY (EGD) ?ENDOSCOPIC RETROGRADE CHOLANGIOPANCREATOGRAPHY (ERCP) ?MALONEY DILATION ?SPHINCTEROTOMY ?REMOVAL OF STONES ?BILIARY DILITATION ?ESOPHAGEAL DILATION ?ENDOSCOPIC RETROGRADE CHOLANGIOPANCREATOGRAPHY (ERCP) ? ?Patient location during evaluation: PACU ?Anesthesia Type: General ?Level of consciousness: awake and alert and oriented ?Pain management: pain level controlled ?Vital Signs Assessment: post-procedure vital signs reviewed and stable ?Respiratory status: spontaneous breathing, nonlabored ventilation and respiratory function stable ?Cardiovascular status: blood pressure returned to baseline and stable ?Postop Assessment: no apparent nausea or vomiting ?Anesthetic complications: no ? ? ?No notable events documented. ? ? ?Last Vitals:  ?Vitals:  ? 05/21/21 1900 05/21/21 1915  ?BP: 138/73 (!) 145/67  ?Pulse: 72 (!) 50  ?Resp: 13 14  ?Temp:    ?SpO2: 98% 97%  ?  ?Last Pain:  ?Vitals:  ? 05/21/21 1850  ?TempSrc:   ?PainSc: 0-No pain  ? ? ?  ?  ?  ?  ?  ?  ? ?Cassidey Barrales C Sumedh Shinsato ? ? ? ? ?

## 2021-05-21 NOTE — Care Management Important Message (Signed)
Important Message ? ?Patient Details  ?Name: [name] ?MRN: [id number] ?Date of Birth: 1935/04/15 ? ? ?Medicare Important Message Given:  N/A - LOS <3 / Initial given by admissions ? ? ? ? ?Tommy Medal ?05/21/2021, 11:20 AM ?

## 2021-05-21 NOTE — Anesthesia Procedure Notes (Signed)
Procedure Name: Intubation ?Date/Time: 05/21/2021 5:31 PM ?Performed by: Denese Killings, MD ?Pre-anesthesia Checklist: Patient identified, Emergency Drugs available, Suction available and Patient being monitored ?Patient Re-evaluated:Patient Re-evaluated prior to induction ?Oxygen Delivery Method: Circle system utilized ?Preoxygenation: Pre-oxygenation with 100% oxygen ?Induction Type: IV induction and Rapid sequence ?Laryngoscope Size: Mac and 3 ?Grade View: Grade II ?Tube type: Oral ?Number of attempts: 1 ?Airway Equipment and Method: Stylet ?Placement Confirmation: ETT inserted through vocal cords under direct vision, positive ETCO2 and breath sounds checked- equal and bilateral ?Secured at: 23 cm ?Tube secured with: Tape (at lip) ?Dental Injury: Teeth and Oropharynx as per pre-operative assessment  ? ? ? ? ?

## 2021-05-21 NOTE — Op Note (Signed)
[facility] ?Patient Name: [name] ?Procedure Date: 05/21/2021 4:36 PM ?MRN: [id number] ?Date of Birth: November 22, 1935 ?Attending MD: Hildred Laser , MD ?CSN: [id number] ?Age: 86 ?Admit Type: Inpatient ?Procedure:                ERCP ?Indications:              Bile duct stone(s) ?Providers:                Hildred Laser, MD, Gwynneth Albright RN, RN, Ulice Dash  ?                          Blima Singer, Technician ?Referring MD:             Holli Humbles, MD ?Medicines:                General Anesthesia ?Complications:            No immediate complications. ?Estimated Blood Loss:     Estimated blood loss: none. ?Procedure:                Pre-Anesthesia Assessment: ?                          - Prior to the procedure, a History and Physical  ?                          was performed, and patient medications and  ?                          allergies were reviewed. The patient's tolerance of  ?                          previous anesthesia was also reviewed. The risks  ?                          and benefits of the procedure and the sedation  ?                          options and risks were discussed with the patient.  ?                          All questions were answered, and informed consent  ?                          was obtained. Prior Anticoagulants: The patient  ?                          last took Eliquis (apixaban) 2 days prior to the  ?                          procedure. ASA Grade Assessment: III - A patient  ?                          with severe systemic disease. After reviewing the  ?  risks and benefits, the patient was deemed in  ?                          satisfactory condition to undergo the procedure. ?                          After obtaining informed consent, the scope was  ?                          passed under direct vision. Throughout the  ?                          procedure, the patient's blood pressure, pulse, and  ?                          oxygen saturations  were monitored continuously. The  ?                          [employer] D single use  ?                          duodenoscope was introduced through the mouth, and  ?                          used to inject contrast into and used to inject  ?                          contrast into the bile duct. The ERCP was  ?                          accomplished without difficulty. The patient  ?                          tolerated the procedure well. ?Scope In: 6:05:56 PM ?Scope Out: 6:27:38 PM ?Total Procedure Duration: 0 hours 21 minutes 42 seconds  ?Findings: ?     The scout film was normal. The esophagus was successfully intubated  ?     under direct vision. The scope was advanced to a normal major papilla in  ?     the descending duodenum without detailed examination of the pharynx,  ?     larynx and associated structures, and upper GI tract. The upper GI tract  ?     was grossly normal. ?     , The major papilla was bulging. The bile duct was deeply cannulated  ?     with the Autotome sphincterotome. Contrast was injected. I personally  ?     interpreted the bile duct images. There was brisk flow of contrast  ?     through the ducts. Image quality was excellent. Contrast extended to the  ?     main bile duct. Contrast extended to the bifurcation. Contrast extended  ?     to the hepatic ducts. The common bile duct contained two filling  ?     defects/stones. The common bile duct and common hepatic duct were mildly  ?     dilated and diffusely dilated. The biliary  sphincterotomy was extended  ?     to a total of 8 mm in length with a braided Autotome sphincterotome  ?     using ERBE electrocautery. There was no post-sphincterotomy bleeding.  ?     The biliary tree was swept with a 10 mm balloon and basket starting at  ?     the bifurcation. Two stones were removed. No stones remained. 1 stone  ?     was removed with Damia basket and the second stone was removed with  ?     stone balloon  extractor. ?Impression:               - The major papilla appeared to be bulging. ?                          -Mildly dilated common bile duct and hepatic duct  ?                          with 2 filling defects/stones. ?                          -Biliary sphincterotomy performed. ?                          -Both the stones were removed using Damia basket  ?                          and stone balloon extractor. ?                          -Pancreatic duct was not cannulated or filled with  ?                          contrast. ?Moderate Sedation: ?     Per Anesthesia Care ?Recommendation:           - Avoid aspirin and anticoagulants for 3 days. ?                          - Clear liquid diet today and modified carb diet in  ?                          AM. ?                          - Continue present medications. ?                          - LFTs and serum amylase in a.m. ?Procedure Code(s):        --- Professional --- ?                          [phone], Endoscopic retrograde  ?                          cholangiopancreatography (ERCP); with removal of  ?  calculi/debris from biliary/pancreatic duct(s) ?                          [id number], Endoscopic retrograde  ?                          cholangiopancreatography (ERCP); with  ?                          sphincterotomy/papillotomy ?Diagnosis Code(s):        --- Professional --- ?                          K83.8, Other specified diseases of biliary tract ?                          K80.50, Calculus of bile duct without cholangitis  ?                          or cholecystitis without obstruction ?                          R93.2, Abnormal findings on diagnostic imaging of  ?                          liver and biliary tract ?CPT copyright 2019 American Medical Association. All rights reserved. ?The codes documented in this report are preliminary and upon coder review may  ?be revised to meet current compliance requirements. ?Hildred Laser, MD ?Hildred Laser,  MD ?05/21/2021 7:02:50 PM ?This report has been signed electronically. ?Number of Addenda: 0 ?

## 2021-05-21 NOTE — Transfer of Care (Signed)
Immediate Anesthesia Transfer of Care Note ? ?Patient: [name] ? ?Procedure(s) Performed: ESOPHAGOGASTRODUODENOSCOPY (EGD) ?ESOPHAGEAL DILATION ?ENDOSCOPIC RETROGRADE CHOLANGIOPANCREATOGRAPHY (ERCP) ? ?Patient Location: PACU ? ?Anesthesia Type:General ? ?Level of Consciousness: awake, sedated and patient cooperative ? ?Airway & Oxygen Therapy: Patient Spontanous Breathing and Patient connected to nasal cannula oxygen ? ?Post-op Assessment: Report given to RN and Post -op Vital signs reviewed and stable ? ?Post vital signs: Reviewed and stable ? ?Last Vitals:  ?Vitals Value Taken Time  ?BP 106/87 05/21/21 1850  ?Temp 98.2   ?Pulse 56 05/21/21 1851  ?Resp 18 05/21/21 1851  ?SpO2 97 % 05/21/21 1851  ?Vitals shown include unvalidated device data. ? ?Last Pain:  ?Vitals:  ? 05/21/21 1628  ?TempSrc:   ?PainSc: 0-No pain  ?   ? ?  ? ?Complications: No notable events documented. ?

## 2021-05-21 NOTE — Op Note (Signed)
[facility] ?Patient Name: [name] ?Procedure Date: 05/21/2021 4:47 PM ?MRN: [id number] ?Date of Birth: 1935-05-01 ?Attending MD: Hildred Laser , MD ?CSN: [id number] ?Age: 86 ?Admit Type: Inpatient ?Procedure:                Upper GI endoscopy ?Indications:              Esophageal dysphagia, Dysphagia ?Providers:                Hildred Laser, MD, Rometta Emery RN, RN, Ulice Dash  ?                          Blima Singer, Technician ?Referring MD:             Holli Humbles, MD ?Medicines:                General Anesthesia ?Complications:            No immediate complications. ?Estimated Blood Loss:     Estimated blood loss: none. ?Procedure:                Pre-Anesthesia Assessment: ?                          - Prior to the procedure, a History and Physical  ?                          was performed, and patient medications and  ?                          allergies were reviewed. The patient's tolerance of  ?                          previous anesthesia was also reviewed. The risks  ?                          and benefits of the procedure and the sedation  ?                          options and risks were discussed with the patient.  ?                          All questions were answered, and informed consent  ?                          was obtained. Prior Anticoagulants: The patient  ?                          last took Eliquis (apixaban) 2 days prior to the  ?                          procedure. ASA Grade Assessment: III - A patient  ?                          with severe systemic disease. After reviewing the  ?  risks and benefits, the patient was deemed in  ?                          satisfactory condition to undergo the procedure. ?                          After obtaining informed consent, the endoscope was  ?                          passed under direct vision. Throughout the  ?                          procedure, the patient's blood pressure, pulse, and  ?                           oxygen saturations were monitored continuously. The  ?                          GIF-H190 ([id number]) scope was introduced through the  ?                          mouth, and advanced to the second part of duodenum.  ?                          The upper GI endoscopy was accomplished without  ?                          difficulty. The patient tolerated the procedure  ?                          well. ?Scope In: 5:57:36 PM ?Scope Out: 6:04:10 PM ?Total Procedure Duration: 0 hours 6 minutes 34 seconds  ?Findings: ?     The hypopharynx was normal. ?     The proximal esophagus, mid esophagus and distal esophagus were normal. ?     There were esophageal mucosal changes suspicious for short-segment  ?     Barrett's esophagus present in the distal esophagus. The maximum  ?     longitudinal extent of these mucosal changes was 1 cm in length. ?     The Z-line was irregular and was found 40 cm from the incisors. ?     No endoscopic abnormality was evident in the esophagus to explain the  ?     patient's complaint of dysphagia. It was decided, however, to proceed  ?     with dilation of the entire esophagus. The dilation site was examined  ?     following endoscope reinsertion and showed no change and no bleeding,  ?     mucosal tear or perforation. ?     The entire examined stomach was normal. ?     The duodenal bulb and second portion of the duodenum were normal.  ?     Bulging ampulla of vater ?Impression:               - Normal hypopharynx. ?                          -  Normal proximal esophagus, mid esophagus and  ?                          distal esophagus. ?                          - Esophageal mucosal changes suspicious for  ?                          short-segment Barrett's esophagus. ?                          - Z-line irregular, 40 cm from the incisors. ?                          - No endoscopic esophageal abnormality to explain  ?                          patient's dysphagia. Esophagus dilated. ?                           - Normal stomach. ?                          - Normal duodenal bulb and second portion of the  ?                          duodenum. ?                          - No specimens collected. ?Moderate Sedation: ?     Per Anesthesia Care ?Recommendation:           - Repeat upper endoscopy in 3 months. ?                          - See the other procedure note for documentation of  ?                          additional recommendations. ?Procedure Code(s):        --- Professional --- ?                          [phone], Esophagogastroduodenoscopy, flexible,  ?                          transoral; diagnostic, including collection of  ?                          specimen(s) by brushing or washing, when performed  ?                          (separate procedure) ?Diagnosis Code(s):        --- Professional --- ?                          K22.8, Other specified diseases of esophagus ?  R13.14, Dysphagia, pharyngoesophageal phase ?CPT copyright 2019 American Medical Association. All rights reserved. ?The codes documented in this report are preliminary and upon coder review may  ?be revised to meet current compliance requirements. ?Hildred Laser, MD ?Hildred Laser, MD ?05/21/2021 6:53:10 PM ?This report has been signed electronically. ?Number of Addenda: 0 ?

## 2021-05-22 DIAGNOSIS — K8050 Calculus of bile duct without cholangitis or cholecystitis without obstruction: Secondary | ICD-10-CM

## 2021-05-22 LAB — COMPREHENSIVE METABOLIC PANEL
ALT: 118 U/L — ABNORMAL HIGH (ref 0–44)
AST: 45 U/L — ABNORMAL HIGH (ref 15–41)
Albumin: 4 g/dL (ref 3.5–5.0)
Alkaline Phosphatase: 277 U/L — ABNORMAL HIGH (ref 38–126)
Anion gap: 13 (ref 5–15)
BUN: 17 mg/dL (ref 8–23)
CO2: 24 mmol/L (ref 22–32)
Calcium: 8.9 mg/dL (ref 8.9–10.3)
Chloride: 102 mmol/L (ref 98–111)
Creatinine, Ser: 0.69 mg/dL (ref 0.61–1.24)
GFR, Estimated: >60 mL/min (ref >60)
Glucose, Bld: 183 mg/dL — ABNORMAL HIGH (ref 70–99)
Potassium: 4.5 mmol/L (ref 3.5–5.1)
Sodium: 139 mmol/L (ref 135–145)
Total Bilirubin: 3.1 mg/dL — ABNORMAL HIGH (ref 0.3–1.2)
Total Protein: 7.7 g/dL (ref 6.5–8.1)

## 2021-05-22 LAB — CBC
HCT: 45.7 % (ref 39.0–52.0)
Hemoglobin: 14.6 g/dL (ref 13.0–17.0)
MCH: 31.2 pg (ref 26.0–34.0)
MCHC: 31.9 g/dL (ref 30.0–36.0)
MCV: 97.6 fL (ref 80.0–100.0)
Platelets: 274 10*3/uL (ref 150–400)
RBC: 4.68 MIL/uL (ref 4.22–5.81)
RDW: 13.7 % (ref 11.5–15.5)
WBC: 5.9 10*3/uL (ref 4.0–10.5)
nRBC: 0 % (ref 0.0–0.2)

## 2021-05-22 LAB — GLUCOSE, CAPILLARY: Glucose-Capillary: 176 mg/dL — ABNORMAL HIGH (ref 70–99)

## 2021-05-22 LAB — AMYLASE: Amylase: 30 U/L (ref 28–100)

## 2021-05-22 MED ORDER — APIXABAN 5 MG PO TABS: 5.0000 mg | ORAL_TABLET | Freq: Two times a day (BID) | ORAL | 1 refills | Status: DC

## 2021-05-22 NOTE — Discharge Summary (Signed)
Physician Discharge Summary  ?[name] TMA:[id number] DOB: 1935/03/27 DOA: 05/19/2021 ? ?PCP: Claretta Fraise, MD ? ?Admit date: 05/19/2021 ?Discharge date: 05/22/2021 ? ?Admitted From: Home ?Disposition: Home ? ?Recommendations for Outpatient Follow-up:  ?Follow up with PCP in 1-2 weeks ?Follow-up with GI as scheduled ? ?Home Health: None ?Equipment/Devices: None ? ?Discharge Condition: Stable ?CODE STATUS: Full ?Diet recommendation: Soft bland low-salt low-fat diabetic diet ? ?Brief/Interim Summary: ?[name] is a 86 y.o. male with medical history significant for DM, HTN, OSA, atrial fibrillation, CAD. Patient presented to the ED with complaints of upper abdominal pain that started 4 days ago.  Imaging initially unremarkable including ultrasound and CT, MRCP shows to obstructive choledocholithiasis, underwent ERCP 05/21/2021 with sphincterotomy and 2 choledocholithiasis removed with GI.  Tolerated procedure quite well. ? ?Patient is tolerating p.o., elevated liver enzymes and bilirubin downtrending as expected.  Otherwise stable and agreeable for discharge home, follow-up closely with PCP and GI as scheduled in the next few weeks. ? ? ?Discharge Diagnoses:  ?Principal Problem: ?  Choledocholithiasis ?Active Problems: ?  Type 2 diabetes mellitus with diabetic neuropathy, unspecified ([place]) ?  Essential hypertension ?  ATRIAL FIBRILLATION ?  Sleep apnea ?  H/O mitral valve repair ?  Elevated liver enzymes ?  Epigastric pain ?  Nausea and vomiting ? ? ? ?Discharge Instructions ? ?Discharge Instructions   ? ? Discharge patient   Complete by: As directed ?  ? Discharge disposition: 01-Home or Self Care  ? Discharge patient date: 05/22/2021  ? ?  ? ?Allergies as of 05/22/2021   ? ?   Reactions  ? Flomax [tamsulosin Hcl] Other (See Comments)  ? Makes him "swimmy headed"  ? ?  ? ?  ?Medication List  ?  ? ?TAKE these medications   ? ?amLODipine 5 MG tablet ?Commonly known as: NORVASC ?Take 1 tablet (5 mg total) by mouth  daily. ?  ?apixaban 5 MG Tabs tablet ?Commonly known as: Eliquis ?Take 1 tablet (5 mg total) by mouth 2 (two) times daily. ?Start taking on: May 25, 2021 ?What changed: These instructions start on May 25, 2021. If you are unsure what to do until then, ask your doctor or other care provider. ?  ?CALCIUM 600 + D PO ?Take 1 tablet by mouth daily. ?  ?Coenzyme Q10 300 MG Caps ?Take 300 mg by mouth daily. ?  ?dicyclomine 20 MG tablet ?Commonly known as: BENTYL ?Take 20 mg by mouth 2 (two) times daily. ?  ?lisinopril 20 MG tablet ?Commonly known as: ZESTRIL ?TAKE 1 TABLET BY MOUTH EVERYDAY AT BEDTIME ?What changed:  ?how much to take ?how to take this ?when to take this ?additional instructions ?  ?meclizine 25 MG tablet ?Commonly known as: ANTIVERT ?Take 25 mg by mouth 3 (three) times daily as needed for dizziness. ?  ?metFORMIN 500 MG 24 hr tablet ?Commonly known as: GLUCOPHAGE-XR ?TAKE 1 TABLET BY MOUTH EVERY DAY WITH BREAKFAST ?What changed:  ?how much to take ?how to take this ?when to take this ?additional instructions ?  ?multivitamin with minerals Tabs tablet ?Take 1 tablet by mouth daily. ?  ?omeprazole 40 MG capsule ?Commonly known as: PRILOSEC ?Take 1 capsule (40 mg total) by mouth daily. ?  ?ondansetron 4 MG disintegrating tablet ?Commonly known as: ZOFRAN-ODT ?Take 4 mg by mouth every 8 (eight) hours as needed for nausea. ?  ?rosuvastatin 5 MG tablet ?Commonly known as: CRESTOR ?Take 1 tablet (5 mg total) by mouth daily. For cholesterol ?  ?  silodosin 4 MG Caps capsule ?Commonly known as: RAPAFLO ?Take 1 capsule (4 mg total) by mouth daily with breakfast. ?  ? ?  ? ? ?Allergies  ?Allergen Reactions  ? Flomax [Tamsulosin Hcl] Other (See Comments)  ?  Makes him "swimmy headed"  ? ? ?Consultations: ?GI ? ?Procedures/Studies: ?US Abdomen Complete ? ?Result Date: 05/18/2021 ?CLINICAL DATA:  Elevated liver function tests. Prior cholecystectomy. EXAM: ABDOMEN ULTRASOUND COMPLETE COMPARISON:  None. FINDINGS:  Gallbladder: Surgically absent Common bile duct: Diameter: 0.7 cm Liver: Hyperechoic liver lesions compatible with known hemangiomas, including the 2.6 by 3.8 cm right hepatic lobe lesion shown on image 26 series 1. Within normal limits in parenchymal echogenicity. Portal vein is patent on color Doppler imaging with normal direction of blood flow towards the liver. IVC: No abnormality visualized. Pancreas: Not observed due to overlying bowel gas. Spleen: Size and appearance within normal limits. Right Kidney: Length: 12.9 cm. Echogenicity within normal limits. No solid mass or hydronephrosis visualized. Multiple simple renal cysts as on prior exams, index lesion 3.6 by 2.8 by 3.0 cm. Left Kidney: Length: 12.5 cm. Echogenicity within normal limits. No solid mass or hydronephrosis visualized. Multiple simple renal cysts as on prior exam, index lesion 9.5 by 5.3 by 8.8 cm. Abdominal aorta: Mid and distal abdominal aorta obscured by overlying bowel gas. Proximal abdominal aorta measures up to 2.9 cm in diameter. Abdominal aortic atherosclerotic calcifications. Other findings: None. IMPRESSION: 1. Hyperechoic liver lesions compatible with known hepatic hemangiomas. 2. Bilateral renal cysts as shown on CT examinations. 3.  Aortic Atherosclerosis (ICD10-I70.0). 4. Nonvisualization of the pancreas and lower portions the abdominal aorta. Electronically Signed   By: Van Clines M.D.   On: 05/18/2021 13:13  ? ?MR 3D Recon At Scanner ? ?Result Date: 05/20/2021 ?CLINICAL DATA:  choledocolithiasis, high suspicion for biliary obstruction due to hyperbilirubinemia and abdominal pain. ^68m GADAVIST GADOBUTROL 1 MMOL/ML IV SOLNELevated LFTs, s/p previous cholecystectomy EXAM: MRI ABDOMEN WITHOUT AND WITH CONTRAST (INCLUDING MRCP) TECHNIQUE: Multiplanar multisequence MR imaging of the abdomen was performed both before and after the administration of intravenous contrast. Heavily T2-weighted images of the biliary and pancreatic  ducts were obtained, and three-dimensional MRCP images were rendered by post processing. CONTRAST:  152mGADAVIST GADOBUTROL 1 MMOL/ML IV SOLN COMPARISON:  Ultrasound 05/19/2021 FINDINGS: Lower chest:  Lung bases are clear. Hepatobiliary: Postcholecystectomy. Cystic duct remnant is prominent. The common hepatic duct measures 8 mm. The common bile duct measures 6 mm. There are 3 filling defect within distal common bile duct measuring approximately 5 mm each (image 20/series 3). The most distal filling defect is at the ampulla measuring 6 mm on image 21/3. Minimal intrahepatic duct dilatation. There are several lobular lesions within the RIGHT hepatic lobe which are high signal intensity on T2 weighted imaging typical of benign hemangiomas. (Series 4). These lesions demonstrate typical postcontrast benign hemangioma enhancement pattern (series 20) Pancreas: Normal pancreatic parenchymal intensity. No ductal dilatation or inflammation. Spleen: Normal spleen. Adrenals/urinary tract: Adrenal glands normal. Multiple nonenhancing Bosniak 1 renal cysts. Cysts of the LEFT and RIGHT kidney. Stomach/Bowel: Stomach and limited of the small bowel is unremarkable Vascular/Lymphatic: Abdominal aortic normal caliber. No retroperitoneal periportal lymphadenopathy. Musculoskeletal: No aggressive osseous lesion IMPRESSION: 1. Choledocholithiasis with three stones in the distal common bile duct. Mild extrahepatic biliary duct dilatation. 2. Benign hemangioma in the RIGHT hepatic lobe. 3. Bilateral benign renal cysts (Bosniak 1). No follow-up recommended. Electronically Signed   By: StSuzy Bouchard.D.   On: 05/20/2021 16:45  ? ?  DG ERCP ? ?Result Date: 05/22/2021 ?CLINICAL DATA:  ERCP with sphincterotomy Abdominal pain EXAM: ERCP TECHNIQUE: Multiple spot images obtained with the fluoroscopic device and submitted for interpretation post-procedure. FLUOROSCOPY: Radiation Exposure Index (as provided by the fluoroscopic device): 60 mGy  Kerma COMPARISON:  MRI abdomen 05/20/2021 FINDINGS: Multiple filling defects within the common bile duct seen on initial images, consistent with choledocholithiasis. Multiple balloon sweeps were performed. No fill

## 2021-05-25 ENCOUNTER — Telehealth: Payer: Self-pay

## 2021-05-25 NOTE — Telephone Encounter (Signed)
Transition Care Management Follow-up Telephone Call ?Date of discharge and from where: 05/22/21 [facility] ?How have you been since you were released from the hospital? Spoke with pt's wife, she states pt is doing well and is outside mowing yard. ?Any questions or concerns? No ? ?Items Reviewed: ?Did the pt receive and understand the discharge instructions provided? Yes  ?Medications obtained and verified? Yes  ?Other? No  ?Any new allergies since your discharge? No  ?Dietary orders reviewed? Yes ?Do you have support at home? Yes  ? ?Home Care and Equipment/Supplies: ?Were home health services ordered? N/A ?If so, what is the name of the agency? N/A  ?Has the agency set up a time to come to the patient's home? not applicable ?Were any new equipment or medical supplies ordered?  No ?What is the name of the medical supply agency? N/A ?Were you able to get the supplies/equipment? not applicable ?Do you have any questions related to the use of the equipment or supplies? No ? ?Functional Questionnaire: (I = Independent and D = Dependent) ?ADLs: I ? ?Bathing/Dressing- I ? ?Meal Prep- I ? ?Eating- I ? ?Maintaining continence- I ? ?Transferring/Ambulation- I ? ?Managing Meds- I ? ?Follow up appointments reviewed: ? ?PCP Hospital f/u appt confirmed? Yes  Scheduled to see Chevis Pretty on 06/03/21 @ 10:15. ?[facility] f/u appt confirmed? No  appt not scheduled at this time. ?Are transportation arrangements needed? No  ?If their condition worsens, is the pt aware to call PCP or go to the Emergency Dept.? Yes ?Was the patient provided with contact information for the PCP's office or ED? Yes ?Was to pt encouraged to call back with questions or concerns? Yes ? ?

## 2021-05-27 ENCOUNTER — Encounter (HOSPITAL_COMMUNITY): Payer: Self-pay | Admitting: Internal Medicine

## 2021-06-03 ENCOUNTER — Encounter: Payer: Self-pay | Admitting: Nurse Practitioner

## 2021-06-03 ENCOUNTER — Ambulatory Visit (INDEPENDENT_AMBULATORY_CARE_PROVIDER_SITE_OTHER): Payer: Medicare Other | Admitting: Nurse Practitioner

## 2021-06-03 VITALS — BP 132/69 | HR 69 | Temp 97.7°F | Resp 20 | Ht 71.0 in | Wt 203.0 lb

## 2021-06-03 DIAGNOSIS — R7989 Other specified abnormal findings of blood chemistry: Secondary | ICD-10-CM

## 2021-06-03 DIAGNOSIS — Z7689 Persons encountering health services in other specified circumstances: Secondary | ICD-10-CM

## 2021-06-03 DIAGNOSIS — K8050 Calculus of bile duct without cholangitis or cholecystitis without obstruction: Secondary | ICD-10-CM

## 2021-06-03 DIAGNOSIS — K2270 Barrett's esophagus without dysplasia: Secondary | ICD-10-CM | POA: Diagnosis not present

## 2021-06-03 NOTE — Progress Notes (Signed)
? ?  Subjective:  ? ? Patient ID: [name], male    DOB: [birth year], 86 y.o.   MRN: [id number] ? ?Today's visit was for Transitional Care Management. ? ?The patient was discharged from [facility] on 05/22/21 with a primary diagnosis of choledocholithiasis. He had gall bladder removed years ago so they did an ERCP and roved 2 gall stones from common bile duct. ? ?Contact with the patient and/or caregiver, by a clinical staff member, was made on 05/25/21 and was documented as a telephone encounter within the EMR. ? ?Through chart review and discussion with the patient I have determined that management of their condition is of moderate complexity.  ? ? ?He has been doing well since he has been home.. no abdominal pain. He says that when they did ERCP they found barretts esophagus and he was told to watch his det. We wil repeat labs today ? ? ? ? ? ?Review of Systems  ?Constitutional:  Negative for diaphoresis.  ?Eyes:  Negative for pain.  ?Respiratory:  Negative for shortness of breath.   ?Cardiovascular:  Negative for chest pain, palpitations and leg swelling.  ?Gastrointestinal:  Negative for abdominal pain.  ?Endocrine: Negative for polydipsia.  ?Skin:  Negative for rash.  ?Neurological:  Negative for dizziness, weakness and headaches.  ?Hematological:  Does not bruise/bleed easily.  ?All other systems reviewed and are negative. ? ?   ?Objective:  ? Physical Exam ?Constitutional:   ?   Appearance: Normal appearance.  ?Cardiovascular:  ?   Rate and Rhythm: Normal rate and regular rhythm.  ?   Heart sounds: Normal heart sounds.  ?Pulmonary:  ?   Effort: Pulmonary effort is normal.  ?   Breath sounds: Normal breath sounds.  ?Skin: ?   General: Skin is warm.  ?Neurological:  ?   General: No focal deficit present.  ?   Mental Status: He is alert and oriented to person, place, and time.  ? ? ?BP 132/69   Pulse 69   Temp 97.7 ?F (36.5 ?C) (Temporal)   Resp 20   Ht $R'5\' 11"'TC$  (1.803 m)   Wt 203 lb (92.1 kg)   BMI 28.31  kg/m?  ? ? ? ?   ?Assessment & Plan:  ?[name] in today with chief complaint of Transitions Of Care ? ? ?1. Encounter for support and coordination of transition of care ?hospital records reviewed ? ?2. Elevated LFTs ?Labs pending ?- CMP14+EGFR ? ?3. Choledocholithiasis ? ?4. Barrett's esophagus ?Avoid spicy and fatty foods ?Keep follow up with GI ? ? ?The above assessment and management plan was discussed with the patient. The patient verbalized understanding of and has agreed to the management plan. Patient is aware to call the clinic if symptoms persist or worsen. Patient is aware when to return to the clinic for a follow-up visit. Patient educated on when it is appropriate to go to the emergency department.  ? ?Mary-Margaret Hassell Done, FNP ? ? ? ?

## 2021-06-03 NOTE — Patient Instructions (Signed)
Barrett's Esophagus  Barrett's esophagus occurs when the tissue that lines the esophagus changes or becomes damaged. The esophagus is the tube that carries food from the throat to the stomach. With Barrett's esophagus, the cells that line the esophagus are replaced by cells that are similar to the lining of the intestines (intestinal metaplasia). Barrett's esophagus itself may not cause any symptoms. However, many people who have Barrett's esophagus also have gastroesophageal reflux disease (GERD), which may cause symptoms such as heartburn. Over time, a few people with this condition may develop cancer of the esophagus. Treatment may include medicines, procedures to destroy the abnormal cells, or surgery. What are the causes? The exact cause of this condition is not known. In some cases, the condition develops from damage to the lining of the esophagus caused by gastroesophageal reflux disease (GERD). GERD occurs when stomach acids flow up from the stomach into the esophagus. Frequent symptoms of GERD may cause intestinal metaplasia or cause cell changes (dysplasia). What increases the risk? You are more likely to develop this condition if you: Have GERD. Are male. Are of European descent. Are obese. Are older than 50. Have a hiatal hernia. This is a condition in which part of your stomach bulges into your chest. Smoke. What are the signs or symptoms? People with Barrett's esophagus often have no symptoms. However, many people with this condition also have GERD. Symptoms of GERD may include: Heartburn. Difficulty swallowing. Dry cough. How is this diagnosed? This condition may be diagnosed based on: Results of an upper gastrointestinal endoscopy. For this exam, a thin, flexible tube with a light and a camera on the end (endoscope) is passed down your esophagus. Your health care provider can view the inside of your esophagus during this procedure. Results of a biopsy. For this procedure,  several tissue samples are removed (biopsy) from your esophagus to look at under a microscope. They are then checked for intestinal metaplasia or dysplasia. How is this treated? Treatment for this condition may include: Medicines (proton pump inhibitors, or PPIs) to decrease or stop GERD. Periodic endoscopic exams to make sure that cancer is not developing. A procedure or surgery for dysplasia. This may include: Removal or destruction of abnormal cells. Removal of part of the esophagus. Follow these instructions at home: Eating and drinking Eat more fruits and vegetables. Avoid fatty foods. Eat small, frequent meals instead of large meals. Avoid foods that cause heartburn. These foods include: Coffee and alcoholic drinks. Tomatoes and foods made with tomatoes. Greasy or spicy foods. Chocolate and peppermint. Do not drink alcohol. General instructions Take over-the-counter and prescription medicines only as told by your health care provider. Do not use any products that contain nicotine or tobacco, such as cigarettes, e-cigarettes, and chewing tobacco. If you need help quitting, ask your health care provider. If you are being treated for GERD, make sure you take medicines and follow all instructions as told by your health care provider. Keep all follow-up visits as told by your health care provider. This is important. Contact a health care provider if: You have heartburn or GERD symptoms. You have difficulty swallowing. Get help right away if: You have chest pain. You are unable to swallow. You vomit blood or material that looks like coffee grounds. Your stool (feces) is bright red or dark. These symptoms may represent a serious problem that is an emergency. Do not wait to see if the symptoms will go away. Get medical help right away. Call your local emergency services (911 in   the [place] Do not drive yourself to the hospital. Summary Barrett's esophagus occurs when the tissue that  lines the esophagus changes or becomes damaged. Barrett's esophagus may be diagnosed with an upper gastrointestinal endoscopy and a biopsy. Treatment may include medicines, procedures to remove abnormal cells, or surgery. Follow your health care provider's instructions about what to eat and drink, what medicines to take, and when to call for help. This information is not intended to replace advice given to you by your health care provider. Make sure you discuss any questions you have with your health care provider. Document Revised: 04/21/2019 Document Reviewed: 04/21/2019 Elsevier Patient Education  2023 [employer]  

## 2021-06-04 LAB — CMP14+EGFR
ALT: 27 IU/L (ref 0–44)
AST: 22 IU/L (ref 0–40)
Albumin/Globulin Ratio: 1.7 (ref 1.2–2.2)
Albumin: 4.3 g/dL (ref 3.6–4.6)
Alkaline Phosphatase: 142 IU/L — ABNORMAL HIGH (ref 44–121)
BUN/Creatinine Ratio: 14 (ref 10–24)
BUN: 10 mg/dL (ref 8–27)
Bilirubin Total: 1.1 mg/dL (ref 0.0–1.2)
CO2: 26 mmol/L (ref 20–29)
Calcium: 9.1 mg/dL (ref 8.6–10.2)
Chloride: 102 mmol/L (ref 96–106)
Creatinine, Ser: 0.71 mg/dL — ABNORMAL LOW (ref 0.76–1.27)
Globulin, Total: 2.5 g/dL (ref 1.5–4.5)
Glucose: 112 mg/dL — ABNORMAL HIGH (ref 70–99)
Potassium: 4.3 mmol/L (ref 3.5–5.2)
Sodium: 142 mmol/L (ref 134–144)
Total Protein: 6.8 g/dL (ref 6.0–8.5)
eGFR: 91 mL/min/{1.73_m2} (ref >59)

## 2021-07-06 ENCOUNTER — Ambulatory Visit: Payer: Medicare Other | Admitting: Family Medicine

## 2021-07-15 ENCOUNTER — Ambulatory Visit (INDEPENDENT_AMBULATORY_CARE_PROVIDER_SITE_OTHER): Payer: Medicare Other | Admitting: Family Medicine

## 2021-07-15 ENCOUNTER — Encounter: Payer: Self-pay | Admitting: Family Medicine

## 2021-07-15 VITALS — BP 108/55 | HR 47 | Temp 97.4°F | Ht 71.0 in | Wt 203.2 lb

## 2021-07-15 DIAGNOSIS — E1140 Type 2 diabetes mellitus with diabetic neuropathy, unspecified: Secondary | ICD-10-CM

## 2021-07-15 DIAGNOSIS — R202 Paresthesia of skin: Secondary | ICD-10-CM | POA: Diagnosis not present

## 2021-07-15 DIAGNOSIS — I10 Essential (primary) hypertension: Secondary | ICD-10-CM | POA: Diagnosis not present

## 2021-07-15 DIAGNOSIS — E785 Hyperlipidemia, unspecified: Secondary | ICD-10-CM | POA: Diagnosis not present

## 2021-07-15 DIAGNOSIS — E119 Type 2 diabetes mellitus without complications: Secondary | ICD-10-CM | POA: Diagnosis not present

## 2021-07-15 LAB — BAYER DCA HB A1C WAIVED: HB A1C (BAYER DCA - WAIVED): 5.9 % — ABNORMAL HIGH (ref 4.8–5.6)

## 2021-07-15 MED ORDER — AMLODIPINE BESYLATE 5 MG PO TABS: 5.0000 mg | ORAL_TABLET | Freq: Every day | ORAL | 3 refills | Status: DC

## 2021-07-15 MED ORDER — METFORMIN HCL ER 500 MG PO TB24: ORAL_TABLET | ORAL | 3 refills | Status: DC

## 2021-07-15 MED ORDER — ROSUVASTATIN CALCIUM 5 MG PO TABS: 5.0000 mg | ORAL_TABLET | Freq: Every day | ORAL | 3 refills | Status: DC

## 2021-07-15 MED ORDER — OMEPRAZOLE 40 MG PO CPDR: 40.0000 mg | DELAYED_RELEASE_CAPSULE | Freq: Every day | ORAL | 3 refills | Status: DC

## 2021-07-15 MED ORDER — LISINOPRIL 20 MG PO TABS: ORAL_TABLET | ORAL | 2 refills | Status: DC

## 2021-07-15 NOTE — Progress Notes (Signed)
Subjective:  Patient ID: [name],  male    DOB: August 10, 1935  Age: 86 y.o.    CC: Medical Management of Chronic Issues   HPI [name] presents for  follow-up of hypertension. Patient has no history of headache chest pain or shortness of breath or recent cough. Patient also denies symptoms of TIA such as numbness weakness lateralizing. Patient denies side effects from medication. States taking it regularly.  Patient also  in for follow-up of elevated cholesterol. Doing well without complaints on current medication. Denies side effects  including myalgia and arthralgia and nausea. Also in today for liver function testing. Currently no chest pain, shortness of breath or other cardiovascular related symptoms noted.  Follow-up of diabetes. Patient does check blood sugar at home. Readings run good. Patient denies symptoms such as excessive hunger or urinary frequency, excessive hunger, nausea No significant hypoglycemic spells noted. Medications reviewed. Pt reports taking them regularly. Pt. denies complication/adverse reaction today.    History [name] has a past medical history of Arthritis, Atrial fibrillation ([place]), BPH (benign prostatic hyperplasia), Cataracts, bilateral, Coronary artery disease excluded, Diabetes mellitus without complication ([place]), Diverticulosis, Endocarditis, valve unspecified, unspecified cause, GERD (gastroesophageal reflux disease), H/O mitral valve repair, Hearing loss, Heart murmur, Hiatal hernia (06/13/2002), High cholesterol, History of kidney stones, OSA on CPAP, Stroke ([place]), Unspecified essential hypertension, Weakness of both arms (07/19/2012), and Wears dentures.   [name] has a past surgical history that includes Vasectomy; Mitral valve repair (~ 2003); Cataract extraction w/PHACO (Right, 05/12/2015); Cataract extraction w/PHACO (Left, 06/12/2015); Total knee arthroplasty (Left, 02/20/2016); Injection knee (Right, 02/20/2016); Cardioversion (N/A,  04/14/2016); Laparoscopic cholecystectomy; Knee arthroscopy (Left); Joint replacement; Shoulder open rotator cuff repair (Left); Colonoscopy; Upper gi endoscopy; Multiple tooth extractions; Cardiac catheterization (02/14/2007); Shoulder arthroscopy with rotator cuff repair and open biceps tenodesis (Right, 01/26/2019); Eye surgery; Total knee arthroplasty (Right, 07/06/2019); Esophagogastroduodenoscopy (N/A, 05/21/2021); ERCP (N/A, 05/21/2021); maloney dilation (N/A, 05/21/2021); sphincterotomy (05/21/2021); removal of stones (05/21/2021); Esophageal dilation (N/A, 05/21/2021); and ERCP (N/A, 05/21/2021).   His family history includes Congestive Heart Failure in his father; Diabetes Mellitus II in his brother and sister; Heart disease in his father; Lung cancer in his brother; Stroke in his brother, brother, sister, and sister.[name] reports that [name] quit smoking about 33 years ago. His smoking use included cigarettes. [name] has a 105.00 pack-year smoking history. [name] has never used smokeless tobacco. [name] reports that [name] does not drink alcohol and does not use drugs.  Current Outpatient Medications on File Prior to Visit  Medication Sig Dispense Refill   apixaban (ELIQUIS) 5 MG TABS tablet Take 1 tablet (5 mg total) by mouth 2 (two) times daily. 180 tablet 1   Calcium Carb-Cholecalciferol (CALCIUM 600 + D PO) Take 1 tablet by mouth daily.     Coenzyme Q10 300 MG CAPS Take 300 mg by mouth daily.     meclizine (ANTIVERT) 25 MG tablet Take 25 mg by mouth 3 (three) times daily as needed for dizziness.     Multiple Vitamin (MULTIVITAMIN WITH MINERALS) TABS tablet Take 1 tablet by mouth daily.     silodosin (RAPAFLO) 4 MG CAPS capsule Take 1 capsule (4 mg total) by mouth daily with breakfast. 90 capsule 3   No current facility-administered medications on file prior to visit.    ROS Review of Systems  Constitutional:  Negative for fever.  Respiratory:  Negative for shortness of breath.   Cardiovascular:  Negative for chest pain.   Musculoskeletal:  Negative for arthralgias.  Skin:  Negative for rash.   Objective:  BP (!) 108/55   Pulse (!) 47   Temp (!) 97.4 F (36.3 C)   Ht '5\' 11"'  (1.803 m)   Wt 203 lb 3.2 oz (92.2 kg)   SpO2 99%   BMI 28.34 kg/m   BP Readings from Last 3 Encounters:  07/15/21 (!) 108/55  06/03/21 132/69  05/22/21 130/66    Wt Readings from Last 3 Encounters:  07/15/21 203 lb 3.2 oz (92.2 kg)  06/03/21 203 lb (92.1 kg)  05/22/21 204 lb (92.5 kg)     Physical Exam Vitals reviewed.  Constitutional:      Appearance: [name] is well-developed.  HENT:     Head: Normocephalic and atraumatic.     Right Ear: External ear normal.     Left Ear: External ear normal.     Mouth/Throat:     Pharynx: No oropharyngeal exudate or posterior oropharyngeal erythema.  Eyes:     Pupils: Pupils are equal, round, and reactive to light.  Cardiovascular:     Rate and Rhythm: Normal rate and regular rhythm.     Heart sounds: No murmur heard. Pulmonary:     Effort: No respiratory distress.     Breath sounds: Normal breath sounds.  Musculoskeletal:     Cervical back: Normal range of motion and neck supple.  Neurological:     Mental Status: [name] is alert and oriented to person, place, and time.    Diabetic Foot Exam - Simple   No data filed     Lab Results  Component Value Date   HGBA1C 5.9 (H) 07/15/2021   HGBA1C 6.2 (H) 04/06/2021   HGBA1C 6.3 (H) 01/01/2021    Assessment & Plan:   [name] was seen today for medical management of chronic issues.  Diagnoses and all orders for this visit:  Type 2 diabetes mellitus with diabetic neuropathy, unspecified whether long term insulin use ([place]) -     Bayer DCA Hb A1c Waived  Essential hypertension -     CBC with Differential/Platelet -     CMP14+EGFR  Dyslipidemia -     Lipid panel  Type 2 diabetes mellitus without complication, without long-term current use of insulin (H[facility]) -     rosuvastatin (CRESTOR) 5 MG tablet; Take 1 tablet (5 mg  total) by mouth daily. For cholesterol  Paresthesias -     Vitamin B12  Other orders -     amLODipine (NORVASC) 5 MG tablet; Take 1 tablet (5 mg total) by mouth daily. -     metFORMIN (GLUCOPHAGE-XR) 500 MG 24 hr tablet; TAKE 1 TABLET BY MOUTH EVERY DAY WITH BREAKFAST -     omeprazole (PRILOSEC) 40 MG capsule; Take 1 capsule (40 mg total) by mouth daily. -     lisinopril (ZESTRIL) 20 MG tablet; TAKE 1/2 TABLET BY MOUTH EVERYDAY AT BEDTIME   I have changed [name]'s lisinopril. I am also having him maintain his multivitamin with minerals, Calcium Carb-Cholecalciferol (CALCIUM 600 + D PO), Coenzyme Q10, meclizine, silodosin, apixaban, amLODipine, metFORMIN, omeprazole, and rosuvastatin.  Meds ordered this encounter  Medications   amLODipine (NORVASC) 5 MG tablet    Sig: Take 1 tablet (5 mg total) by mouth daily.    Dispense:  90 tablet    Refill:  3   metFORMIN (GLUCOPHAGE-XR) 500 MG 24 hr tablet    Sig: TAKE 1 TABLET BY MOUTH EVERY DAY WITH BREAKFAST    Dispense:  90 tablet  Refill:  3   omeprazole (PRILOSEC) 40 MG capsule    Sig: Take 1 capsule (40 mg total) by mouth daily.    Dispense:  90 capsule    Refill:  3   rosuvastatin (CRESTOR) 5 MG tablet    Sig: Take 1 tablet (5 mg total) by mouth daily. For cholesterol    Dispense:  90 tablet    Refill:  3   lisinopril (ZESTRIL) 20 MG tablet    Sig: TAKE 1/2 TABLET BY MOUTH EVERYDAY AT BEDTIME    Dispense:  90 tablet    Refill:  2     Follow-up: Return in about 3 months (around 10/15/2021).  Claretta Fraise, M.D.

## 2021-07-16 LAB — CBC WITH DIFFERENTIAL/PLATELET
Basophils Absolute: 0 10*3/uL (ref 0.0–0.2)
Basos: 1 %
EOS (ABSOLUTE): 0.1 10*3/uL (ref 0.0–0.4)
Eos: 2 %
Hematocrit: 42.8 % (ref 37.5–51.0)
Hemoglobin: 14.8 g/dL (ref 13.0–17.7)
Immature Grans (Abs): 0 10*3/uL (ref 0.0–0.1)
Immature Granulocytes: 0 %
Lymphocytes Absolute: 1.7 10*3/uL (ref 0.7–3.1)
Lymphs: 29 %
MCH: 32 pg (ref 26.6–33.0)
MCHC: 34.6 g/dL (ref 31.5–35.7)
MCV: 93 fL (ref 79–97)
Monocytes Absolute: 0.5 10*3/uL (ref 0.1–0.9)
Monocytes: 9 %
Neutrophils Absolute: 3.6 10*3/uL (ref 1.4–7.0)
Neutrophils: 59 %
Platelets: 237 10*3/uL (ref 150–450)
RBC: 4.62 x10E6/uL (ref 4.14–5.80)
RDW: 12.8 % (ref 11.6–15.4)
WBC: 6 10*3/uL (ref 3.4–10.8)

## 2021-07-16 LAB — LIPID PANEL
Chol/HDL Ratio: 2.2 ratio (ref 0.0–5.0)
Cholesterol, Total: 114 mg/dL (ref 100–199)
HDL: 53 mg/dL (ref >39)
LDL Chol Calc (NIH): 47 mg/dL (ref 0–99)
Triglycerides: 63 mg/dL (ref 0–149)
VLDL Cholesterol Cal: 14 mg/dL (ref 5–40)

## 2021-07-16 LAB — CMP14+EGFR
ALT: 19 IU/L (ref 0–44)
AST: 17 IU/L (ref 0–40)
Albumin/Globulin Ratio: 1.8 (ref 1.2–2.2)
Albumin: 4.6 g/dL (ref 3.6–4.6)
Alkaline Phosphatase: 84 IU/L (ref 44–121)
BUN/Creatinine Ratio: 15 (ref 10–24)
BUN: 11 mg/dL (ref 8–27)
Bilirubin Total: 0.9 mg/dL (ref 0.0–1.2)
CO2: 29 mmol/L (ref 20–29)
Calcium: 9.1 mg/dL (ref 8.6–10.2)
Chloride: 103 mmol/L (ref 96–106)
Creatinine, Ser: 0.75 mg/dL — ABNORMAL LOW (ref 0.76–1.27)
Globulin, Total: 2.5 g/dL (ref 1.5–4.5)
Glucose: 110 mg/dL — ABNORMAL HIGH (ref 70–99)
Potassium: 4.3 mmol/L (ref 3.5–5.2)
Sodium: 144 mmol/L (ref 134–144)
Total Protein: 7.1 g/dL (ref 6.0–8.5)
eGFR: 90 mL/min/{1.73_m2} (ref >59)

## 2021-07-17 LAB — VITAMIN B12 DEFICIENCY CASCADE: Vitamin B-12: 716 pg/mL (ref 232–1245)

## 2021-07-17 LAB — SPECIMEN STATUS REPORT

## 2021-07-17 LAB — INTERPRETATION

## 2021-07-20 NOTE — Progress Notes (Signed)
Hello Erikson,  Your lab result is normal and/or stable.Some minor variations that are not significant are commonly marked abnormal, but do not represent any medical problem for you.  Best regards, Javeria Briski, M.D.

## 2021-08-17 ENCOUNTER — Ambulatory Visit (INDEPENDENT_AMBULATORY_CARE_PROVIDER_SITE_OTHER): Payer: Medicare Other | Admitting: Gastroenterology

## 2021-08-17 ENCOUNTER — Encounter (INDEPENDENT_AMBULATORY_CARE_PROVIDER_SITE_OTHER): Payer: Self-pay | Admitting: Gastroenterology

## 2021-08-17 VITALS — BP 127/66 | HR 54 | Temp 98.1°F | Ht 71.0 in | Wt 204.5 lb

## 2021-08-17 DIAGNOSIS — K8050 Calculus of bile duct without cholangitis or cholecystitis without obstruction: Secondary | ICD-10-CM | POA: Diagnosis not present

## 2021-08-17 DIAGNOSIS — K219 Gastro-esophageal reflux disease without esophagitis: Secondary | ICD-10-CM | POA: Diagnosis not present

## 2021-08-17 DIAGNOSIS — R1310 Dysphagia, unspecified: Secondary | ICD-10-CM

## 2021-08-17 DIAGNOSIS — K529 Noninfective gastroenteritis and colitis, unspecified: Secondary | ICD-10-CM

## 2021-08-17 DIAGNOSIS — Z79899 Other long term (current) drug therapy: Secondary | ICD-10-CM | POA: Diagnosis not present

## 2021-08-17 NOTE — Progress Notes (Unsigned)
Maylon Peppers, M.D. Gastroenterology & Hepatology [facility] [address] [place], [place] [address]  Primary Care Physician: Claretta Fraise, MD [place] [id number]  I will communicate my assessment and recommendations to the referring MD via EMR.  Problems: Choledocholithiasis Chronic diarrhea  History of Present Illness: [name] is a 86 y.o. male with past medical history of atrial fibrillation, BPH, diabetes, stroke, OSA, hyperlipidemia, GERD, hypertension, who presents for follow up of recent hospitalization for choledocholithiasis.  The patient was last seen by Dr. Laural Golden while hospitalized in April 2023.At that time, the patient was admitted to the hospital after presenting epigastric abdominal pain with fever.  He also presented having some dysphagia.  He was found to have choledocholithiasis on cross-sectional abdominal imaging with elevation of his aminotransferases and total bilirubin.  Due to this, he underwent both an EGD and a colonoscopy on 05/21/2021.  Esophagogastroduodenospy showed changes suspicious for Barrett's esophagus but no biopsies were taken.  Underwent an empiric dilation with a Maloney dilator up to 49 [place].  Endoscopic retrograde cholangiopancreatography showed a prominent ampulla of Vater.  2 filling defects were seen in the colon bile duct and the common hepatic duct.  Biliary sphincterotomy was performed and stones were removed with a balloon.  Patient had improvement of his liver enzymes and pain during his hospitalization and left the hospital.  Notably, the patient had a cholecystectomy multiple years ago.  Patient reports that he has felt better after he left the hospital. He states that he still feels the food sits in the middle of his chest. It happens a couple of times a week but food eventually goes down. Felt slightly better with recent dilation as he is not having dysphagia  as often as in the past. Never had to vomit. The patient denies having any nausea, vomiting, fever, chills, hematochezia, melena, hematemesis, abdominal distention, abdominal pain,  jaundice, pruritus. Has lost 40 lb since he had his hospitalization as he implemented weight watchers.  Most recent LFTs from 07/15/2021 showed an AST of 17, ALT of 19, total bilirubin 0.9, alkaline phosphatase 84, normal electrolytes and renal function, CBC was also within normal limits.  Patient deneis having anyb ongoing heratburn as long as he takes omeprazole 40 mg qday. Still has some mild dysphagia.  Patient reports that for multiple years he has presented watery to soft bowel movements for art least the last 4 years. He reports that he presents 3-4 Bms per day in average. He used to have regular bowel movements in the past. No nighttime symptoms but is concerned as he has had accidents due to urgency or when he wanted to pass gas but actually defecated. He has been using sweeteners frequently with his food.  Last EGD: 05/21/2021 - Normal hypopharynx. - Normal proximal esophagus, mid esophagus and distal esophagus. - Esophageal mucosal changes suspicious for short-segment Barrett's esophagus. - Z-line irregular, 40 cm from the incisors. - No endoscopic esophageal abnormality to explain patient's dysphagia. Esophagus dilated. - Normal stomach. - Normal duodenal bulb and second portion of the duodenum.  Recommended repeat EGD in 3 months.  Past Medical History: Past Medical History:  Diagnosis Date   Arthritis    knees, shoulder,  hips (10/24/2017)   Atrial fibrillation (HCC)    on eliquis   BPH (benign prostatic hyperplasia)    Cataracts, bilateral    removed by surgery   Coronary artery disease excluded    Diabetes mellitus without  complication ([place])    type 2   Diverticulosis    Endocarditis, valve unspecified, unspecified cause    GERD (gastroesophageal reflux disease)    H/O mitral valve repair     Postoperative ring with postoperative atrial fibrillation   Hearing loss    both ears - does not use hearing aids   Heart murmur    hx   Hiatal hernia 06/13/2002   High cholesterol    History of kidney stones    passed spontaneously x2    OSA on CPAP    uses cpap nightly   Stroke [facility])    Unspecified essential hypertension    Weakness of both arms 07/19/2012   Wears dentures    upper and lower partial    Past Surgical History: Past Surgical History:  Procedure Laterality Date   CARDIAC CATHETERIZATION  02/14/2007   x 2   CARDIOVERSION N/A 04/14/2016   Procedure: CARDIOVERSION;  Surgeon: Minus Breeding, MD;  Location: [place];  Service: Cardiovascular;  Laterality: N/A;   CATARACT EXTRACTION W/PHACO Right 05/12/2015   Procedure: CATARACT EXTRACTION PHACO AND INTRAOCULAR LENS PLACEMENT RIGHT EYE CDE=7.75;  Surgeon: Tonny Branch, MD;  Location: [facility];  Service: Ophthalmology;  Laterality: Right;   CATARACT EXTRACTION W/PHACO Left 06/12/2015   Procedure: CATARACT EXTRACTION PHACO AND INTRAOCULAR LENS PLACEMENT (IOC);  Surgeon: Tonny Branch, MD;  Location: [facility];  Service: Ophthalmology;  Laterality: Left;  CDE:  13.17   COLONOSCOPY     ERCP N/A 05/21/2021   Procedure: ENDOSCOPIC RETROGRADE CHOLANGIOPANCREATOGRAPHY (ERCP);  Surgeon: Rogene Houston, MD;  Location: [facility];  Service: Gastroenterology;  Laterality: N/A;   ERCP N/A 05/21/2021   Procedure: ENDOSCOPIC RETROGRADE CHOLANGIOPANCREATOGRAPHY (ERCP);  Surgeon: Rogene Houston, MD;  Location: [facility];  Service: Gastroenterology;  Laterality: N/A;   ESOPHAGEAL DILATION N/A 05/21/2021   Procedure: ESOPHAGEAL DILATION;  Surgeon: Rogene Houston, MD;  [facility]: [facility];  Service: Gastroenterology;  Laterality: N/A;   ESOPHAGOGASTRODUODENOSCOPY N/A 05/21/2021   Procedure: ESOPHAGOGASTRODUODENOSCOPY (EGD);  Surgeon: Rogene Houston, MD;  Location: [facility];  Service: Gastroenterology;  Laterality: N/A;   EYE SURGERY     Bilateral cataracts    INJECTION KNEE Right 02/20/2016   Procedure: KNEE INJECTION;  Surgeon: [name], MD;  Location: [place];  Service: Orthopedics;  Laterality: Right;   JOINT REPLACEMENT     KNEE ARTHROSCOPY Left    LAPAROSCOPIC CHOLECYSTECTOMY     MALONEY DILATION N/A 05/21/2021   Procedure: MALONEY DILATION;  Surgeon: Rogene Houston, MD;  Location: [facility];  Service: Gastroenterology;  Laterality: N/A;   MITRAL VALVE REPAIR  ~ 2003   MULTIPLE TOOTH EXTRACTIONS     REMOVAL OF STONES  05/21/2021   Procedure: REMOVAL OF STONES;  Surgeon: Rogene Houston, MD;  Location: [facility];  Service: Gastroenterology;;   SHOULDER ARTHROSCOPY WITH ROTATOR CUFF REPAIR AND OPEN BICEPS TENODESIS Right 01/26/2019   Procedure: right shoulder arthroscopy, rotator cuff repair and biceps tenodesis;  Surgeon: [name], MD;  Location: [place];  Service: Orthopedics;  Laterality: Right;   SHOULDER OPEN ROTATOR CUFF REPAIR Left    SPHINCTEROTOMY  05/21/2021   Procedure: SPHINCTEROTOMY;  Surgeon: Rogene Houston, MD;  Location: [facility];  Service: Gastroenterology;;   TOTAL KNEE ARTHROPLASTY Left 02/20/2016   Procedure: LEFT TOTAL KNEE ARTHROPLASTY WITH RIGHT KNEE INJECTION;  Surgeon: [name], MD;  Location: [place];  Service: Orthopedics;  Laterality: Left;   TOTAL KNEE ARTHROPLASTY Right 07/06/2019  Procedure: RIGHT TOTAL KNEE ARTHROPLASTY;  Surgeon: [name], MD;  Location: [place];  Service: Orthopedics;  Laterality: Right;   UPPER GI ENDOSCOPY     VASECTOMY      Family History: Family History  Problem Relation Age of Onset   Congestive Heart Failure Father    Heart disease Father    Stroke Brother    Stroke Sister    Diabetes Mellitus II Brother    Diabetes Mellitus II Sister    Lung cancer Brother    Stroke Sister    Stroke Brother    Colon cancer Neg Hx    Esophageal cancer Neg Hx    Rectal cancer Neg Hx    Stomach cancer Neg Hx     Social History: Social History   Tobacco Use  Smoking Status Former    Packs/day: 3.00   Years: 35.00   Total pack years: 105.00   Types: Cigarettes   Quit date: 02/16/1988   Years since quitting: 33.5   Passive exposure: Past  Smokeless Tobacco Never  Tobacco Comments    Year Quit: 1990    Social History   Substance and Sexual Activity  Alcohol Use No   Social History   Substance and Sexual Activity  Drug Use Never    Allergies: Allergies  Allergen Reactions   Flomax [Tamsulosin Hcl] Other (See Comments)    Makes him "swimmy headed"    Medications: Current Outpatient Medications  Medication Sig Dispense Refill   amLODipine (NORVASC) 5 MG tablet Take 1 tablet (5 mg total) by mouth daily. 90 tablet 3   apixaban (ELIQUIS) 5 MG TABS tablet Take 1 tablet (5 mg total) by mouth 2 (two) times daily. 180 tablet 1   Calcium Carb-Cholecalciferol (CALCIUM 600 + D PO) Take 1 tablet by mouth daily.     Coenzyme Q10 300 MG CAPS Take 300 mg by mouth daily.     lisinopril (ZESTRIL) 20 MG tablet TAKE 1/2 TABLET BY MOUTH EVERYDAY AT BEDTIME 90 tablet 2   meclizine (ANTIVERT) 25 MG tablet Take 25 mg by mouth 3 (three) times daily as needed for dizziness.     metFORMIN (GLUCOPHAGE-XR) 500 MG 24 hr tablet TAKE 1 TABLET BY MOUTH EVERY DAY WITH BREAKFAST 90 tablet 3   Multiple Vitamin (MULTIVITAMIN WITH MINERALS) TABS tablet Take 1 tablet by mouth daily.     omeprazole (PRILOSEC) 40 MG capsule Take 1 capsule (40 mg total) by mouth daily. 90 capsule 3   rosuvastatin (CRESTOR) 5 MG tablet Take 1 tablet (5 mg total) by mouth daily. For cholesterol 90 tablet 3   silodosin (RAPAFLO) 4 MG CAPS capsule Take 1 capsule (4 mg total) by mouth daily with breakfast. 90 capsule 3   No current facility-administered medications for this visit.    Review of Systems: GENERAL: negative for malaise, night sweats HEENT: No changes in hearing or vision, no nose bleeds or other nasal problems. NECK: Negative for lumps, goiter, pain and significant neck swelling RESPIRATORY: Negative  for cough, wheezing CARDIOVASCULAR: Negative for chest pain, leg swelling, palpitations, orthopnea GI: SEE HPI MUSCULOSKELETAL: Negative for joint pain or swelling, back pain, and muscle pain. SKIN: Negative for lesions, rash PSYCH: Negative for sleep disturbance, mood disorder and recent psychosocial stressors. HEMATOLOGY Negative for prolonged bleeding, bruising easily, and swollen nodes. ENDOCRINE: Negative for cold or heat intolerance, polyuria, polydipsia and goiter. NEURO: negative for tremor, gait imbalance, syncope and seizures. The remainder of the review of systems is noncontributory.  Physical Exam: BP 127/66 (BP Location: Right Arm, Patient Position: Sitting, Cuff Size: Large)   Pulse (!) 54   Temp 98.1 F (36.7 C) (Oral)   Ht '5\' 11"'$  (1.803 m)   Wt 204 lb 8 oz (92.8 kg)   BMI 28.52 kg/m  GENERAL: The patient is AO x3, in no acute distress. HEENT: Head is normocephalic and atraumatic. EOMI are intact. Mouth is well hydrated and without lesions. NECK: Supple. No masses LUNGS: Clear to auscultation. No presence of rhonchi/wheezing/rales. Adequate chest expansion HEART: RRR, normal s1 and s2. ABDOMEN: Soft, nontender, no guarding, no peritoneal signs, and nondistended. BS +. No masses. EXTREMITIES: Without any cyanosis, clubbing, rash, lesions or edema. NEUROLOGIC: AOx3, no focal motor deficit. SKIN: no jaundice, no rashes  Imaging/Labs: as above  I personally reviewed and interpreted the available labs, imaging and endoscopic files.  Impression and Plan: DANYAEL ALIPIO is a 86 y.o. male with past medical history of atrial fibrillation, BPH, diabetes, stroke, OSA, hyperlipidemia, GERD, hypertension, who presents for follow up of recent hospitalization for choledocholithiasis.  The patient had resolution of his liver enzyme elevation after he underwent ERCP with removal of stones and sphincterotomy.  He likely developed secondary biliary stones as he is a status  postcholecystectomy.  Notably, he has presented persistent episodes of dysphagia despite undergoing dilation but this is better compared to prior.  We will repeat the endoscopy to determine if he indeed has Barrett's esophagus.  For now he will need to continue omeprazole 40 mg every day. The patient and I held a thorough discussion about potential nonpharmacologic treatments for reflux which include Transoral Incisionless Fundoplication (TIF).  The benefits and risks, as well as prognosis with the use of the different modalities was thoroughly discussed with the patient who understood and agreed.  The patient will read more about these procedures and will let me know if interested to pursue this in the future.  Finally, he has presented recurrent episodes of soft to watery bowel movements.  We will evaluate organic etiologies with celiac serologies, TSH and stool testing for pancreatic insufficiency.  He will benefit from increasing his intake of fiber and stopping artificial sweeteners as these could be leading to osmotic diarrhea.  - Schedule EGD  - Perform blood and stool workup - Start Benefiber fiber supplements daily to increase stool bulk - Stop taking artificial sweeteners - Continue omeprazole 40 mg qday  All questions were answered.      [name] Quale, MD Gastroenterology and Hepatology [facility]

## 2021-08-17 NOTE — Patient Instructions (Addendum)
Schedule EGD  Perform blood and stool workup Start Benefiber fiber supplements daily to increase stool bulk Stop taking artificial sweeteners Continue omeprazole 40 mg qday

## 2021-08-19 DIAGNOSIS — Z79899 Other long term (current) drug therapy: Secondary | ICD-10-CM | POA: Diagnosis not present

## 2021-08-19 DIAGNOSIS — K529 Noninfective gastroenteritis and colitis, unspecified: Secondary | ICD-10-CM | POA: Diagnosis not present

## 2021-08-20 LAB — CELIAC DISEASE PANEL
(tTG) Ab, IgA: 2.6 U/mL
(tTG) Ab, IgG: 8.4 U/mL
Gliadin IgA: 1 U/mL
Gliadin IgG: <1 U/mL
Immunoglobulin A: 246 mg/dL (ref 70–320)

## 2021-08-20 LAB — TSH: TSH: 2.04 mIU/L (ref 0.40–4.50)

## 2021-08-24 LAB — PANCREATIC ELASTASE, FECAL: Pancreatic Elastase-1, Stool: 108 mcg/g — ABNORMAL LOW

## 2021-08-24 LAB — FECAL FAT, QUALITATIVE: FECAL FAT, QUALITATIVE: NORMAL

## 2021-08-26 ENCOUNTER — Telehealth (INDEPENDENT_AMBULATORY_CARE_PROVIDER_SITE_OTHER): Payer: Self-pay | Admitting: Gastroenterology

## 2021-08-26 MED ORDER — PANCRELIPASE (LIP-PROT-AMYL) 36000-114000 UNITS PO CPEP: 72000.0000 [IU] | ORAL_CAPSULE | Freq: Three times a day (TID) | ORAL | 3 refills | Status: DC

## 2021-08-26 MED ORDER — PANCRELIPASE (LIP-PROT-AMYL) 36000-114000 UNITS PO CPEP: 36000.0000 [IU] | ORAL_CAPSULE | Freq: Two times a day (BID) | ORAL | 3 refills | Status: DC | PRN

## 2021-08-26 NOTE — Telephone Encounter (Signed)
Creon prescription sent to pharmacy.

## 2021-08-27 ENCOUNTER — Other Ambulatory Visit (INDEPENDENT_AMBULATORY_CARE_PROVIDER_SITE_OTHER): Payer: Self-pay | Admitting: Gastroenterology

## 2021-08-27 ENCOUNTER — Telehealth (INDEPENDENT_AMBULATORY_CARE_PROVIDER_SITE_OTHER): Payer: Self-pay

## 2021-08-27 ENCOUNTER — Ambulatory Visit (INDEPENDENT_AMBULATORY_CARE_PROVIDER_SITE_OTHER): Payer: Medicare Other | Admitting: Gastroenterology

## 2021-08-27 DIAGNOSIS — K8681 Exocrine pancreatic insufficiency: Secondary | ICD-10-CM

## 2021-08-27 MED ORDER — ZENPEP 3000-10000 UNITS PO CPEP: 2.0000 | ORAL_CAPSULE | Freq: Three times a day (TID) | ORAL | 1 refills | Status: DC

## 2021-08-27 NOTE — Telephone Encounter (Signed)
Can't afford Creon Cost $1700.00 Is there an alternative? Please advise.

## 2021-08-27 NOTE — Telephone Encounter (Signed)
We will send Zenpep instead of Creon.

## 2021-08-27 NOTE — Telephone Encounter (Signed)
Patient aware of all.

## 2021-10-09 ENCOUNTER — Telehealth (INDEPENDENT_AMBULATORY_CARE_PROVIDER_SITE_OTHER): Payer: Self-pay

## 2021-10-09 ENCOUNTER — Other Ambulatory Visit (INDEPENDENT_AMBULATORY_CARE_PROVIDER_SITE_OTHER): Payer: Self-pay | Admitting: Gastroenterology

## 2021-10-09 DIAGNOSIS — K529 Noninfective gastroenteritis and colitis, unspecified: Secondary | ICD-10-CM

## 2021-10-09 MED ORDER — COLESTIPOL HCL 1 G PO TABS: 2.0000 g | ORAL_TABLET | Freq: Every day | ORAL | 1 refills | Status: DC

## 2021-10-09 NOTE — Telephone Encounter (Signed)
Patient wife called today stating patient was started on Zenpep in July and developed a rash in his head right after statring it. Patient has not got a rash in his head, Body and extremities. They are wanting to know if patient should stop the zenpep and if this is the cause of the rash. Patient does not have any shortness of breath or any other issues other than the rash. Please advise.

## 2021-10-09 NOTE — Telephone Encounter (Signed)
Spoke with patient regarding his rash, he has presented a rash for the last month and a half after starting the CMP.  I explained that up to 10% of patients may present these rash.  He has not felt any improvement at all with same PIP.  I advised him to stop the medication and I will start him on colestipol 2 g every day, he will need to take it 4 hours apart from his other medications.

## 2021-10-12 NOTE — Telephone Encounter (Signed)
Noted  

## 2021-10-20 ENCOUNTER — Encounter: Payer: Self-pay | Admitting: Family Medicine

## 2021-10-20 ENCOUNTER — Ambulatory Visit (INDEPENDENT_AMBULATORY_CARE_PROVIDER_SITE_OTHER): Payer: Medicare Other | Admitting: Family Medicine

## 2021-10-20 VITALS — BP 117/55 | HR 52 | Temp 97.5°F | Ht 71.0 in | Wt 191.2 lb

## 2021-10-20 DIAGNOSIS — E1140 Type 2 diabetes mellitus with diabetic neuropathy, unspecified: Secondary | ICD-10-CM | POA: Diagnosis not present

## 2021-10-20 DIAGNOSIS — E785 Hyperlipidemia, unspecified: Secondary | ICD-10-CM

## 2021-10-20 DIAGNOSIS — I10 Essential (primary) hypertension: Secondary | ICD-10-CM | POA: Diagnosis not present

## 2021-10-20 LAB — BAYER DCA HB A1C WAIVED: HB A1C (BAYER DCA - WAIVED): 5.6 % (ref 4.8–5.6)

## 2021-10-20 MED ORDER — SILODOSIN 4 MG PO CAPS: 4.0000 mg | ORAL_CAPSULE | Freq: Every day | ORAL | 3 refills | Status: DC

## 2021-10-20 NOTE — Progress Notes (Signed)
 Subjective:  Patient ID: [name], male    DOB: 01/11/1936  Age: 86 y.o. MRN: [id number]  CC: Medical Management of Chronic Issues   HPI Adon A Mort presents forFollow-up of diabetes. Patient doesn't check blood sugar at home.   Patient denies symptoms such as polyuria, polydipsia, excessive hunger, nausea No significant hypoglycemic spells noted. Medications reviewed. Pt reports taking them regularly without complication/adverse reaction being reported today.  Rash  for a month. Started on a new medication, but stopping it hasn't helped. Went on a substitute. - cholestid.   Nocturia has resolved with rapaflo  Rash for 1 month. Affecting left shoulder most, but also on contralateral side. Small amounts on legs, arms. Planning dermatology visit schedules for 2 days from now.   History Froylan has a past medical history of Arthritis, Atrial fibrillation (HCC), BPH (benign prostatic hyperplasia), Cataracts, bilateral, Coronary artery disease excluded, Diabetes mellitus without complication (HCC), Diverticulosis, Endocarditis, valve unspecified, unspecified cause, GERD (gastroesophageal reflux disease), H/O mitral valve repair, Hearing loss, Heart murmur, Hiatal hernia (06/13/2002), High cholesterol, History of kidney stones, OSA on CPAP, Stroke (HCC), Unspecified essential hypertension, Weakness of both arms (07/19/2012), and Wears dentures.   He has a past surgical history that includes Vasectomy; Mitral valve repair (~ 2003); Cataract extraction w/PHACO (Right, 05/12/2015); Cataract extraction w/PHACO (Left, 06/12/2015); Total knee arthroplasty (Left, 02/20/2016); Injection knee (Right, 02/20/2016); Cardioversion (N/A, 04/14/2016); Laparoscopic cholecystectomy; Knee arthroscopy (Left); Joint replacement; Shoulder open rotator cuff repair (Left); Colonoscopy; Upper gi endoscopy; Multiple tooth extractions; Cardiac catheterization (02/14/2007); Shoulder arthroscopy with rotator cuff repair  and open biceps tenodesis (Right, 01/26/2019); Eye surgery; Total knee arthroplasty (Right, 07/06/2019); Esophagogastroduodenoscopy (N/A, 05/21/2021); ERCP (N/A, 05/21/2021); maloney dilation (N/A, 05/21/2021); sphincterotomy (05/21/2021); removal of stones (05/21/2021); Esophageal dilation (N/A, 05/21/2021); and ERCP (N/A, 05/21/2021).   His family history includes Congestive Heart Failure in his father; Diabetes Mellitus II in his brother and sister; Heart disease in his father; Lung cancer in his brother; Stroke in his brother, brother, sister, and sister.He reports that he quit smoking about 33 years ago. His smoking use included cigarettes. He has a 105.00 pack-year smoking history. He has been exposed to tobacco smoke. He has never used smokeless tobacco. He reports that he does not drink alcohol and does not use drugs.  Current Outpatient Medications on File Prior to Visit  Medication Sig Dispense Refill   amLODipine (NORVASC) 5 MG tablet Take 1 tablet (5 mg total) by mouth daily. 90 tablet 3   apixaban (ELIQUIS) 5 MG TABS tablet Take 1 tablet (5 mg total) by mouth 2 (two) times daily. 180 tablet 1   Calcium Carb-Cholecalciferol (CALCIUM 600 + D PO) Take 1 tablet by mouth daily.     Coenzyme Q10 300 MG CAPS Take 300 mg by mouth daily.     colestipol (COLESTID) 1 g tablet Take 2 tablets (2 g total) by mouth daily. take it 4 hours apart from his other medications 180 tablet 1   lisinopril (ZESTRIL) 20 MG tablet TAKE 1/2 TABLET BY MOUTH EVERYDAY AT BEDTIME 90 tablet 2   meclizine (ANTIVERT) 25 MG tablet Take 25 mg by mouth 3 (three) times daily as needed for dizziness.     metFORMIN (GLUCOPHAGE-XR) 500 MG 24 hr tablet TAKE 1 TABLET BY MOUTH EVERY DAY WITH BREAKFAST 90 tablet 3   Multiple Vitamin (MULTIVITAMIN WITH MINERALS) TABS tablet Take 1 tablet by mouth daily.     omeprazole (PRILOSEC) 40 MG capsule Take 1 capsule (40 mg total)   by mouth daily. 90 capsule 3   rosuvastatin (CRESTOR) 5 MG tablet Take 1 tablet  (5 mg total) by mouth daily. For cholesterol 90 tablet 3   No current facility-administered medications on file prior to visit.    ROS Review of Systems  Constitutional:  Negative for fever.  Respiratory:  Negative for shortness of breath.   Cardiovascular:  Negative for chest pain.  Musculoskeletal:  Negative for arthralgias.  Skin:  Negative for rash.    Objective:  BP (!) 117/55   Pulse (!) 52   Temp (!) 97.5 F (36.4 C)   Ht 5' 11" (1.803 m)   Wt 191 lb 3.2 oz (86.7 kg)   SpO2 98%   BMI 26.67 kg/m   BP Readings from Last 3 Encounters:  10/20/21 (!) 117/55  08/17/21 127/66  07/15/21 (!) 108/55    Wt Readings from Last 3 Encounters:  10/20/21 191 lb 3.2 oz (86.7 kg)  08/17/21 204 lb 8 oz (92.8 kg)  07/15/21 203 lb 3.2 oz (92.2 kg)     Physical Exam Vitals reviewed.  Constitutional:      Appearance: He is well-developed.  HENT:     Head: Normocephalic and atraumatic.     Right Ear: External ear normal.     Left Ear: External ear normal.     Mouth/Throat:     Pharynx: No oropharyngeal exudate or posterior oropharyngeal erythema.  Eyes:     Pupils: Pupils are equal, round, and reactive to light.  Cardiovascular:     Rate and Rhythm: Normal rate and regular rhythm.     Heart sounds: No murmur heard. Pulmonary:     Effort: No respiratory distress.     Breath sounds: Normal breath sounds.  Musculoskeletal:     Cervical back: Normal range of motion and neck supple.  Neurological:     Mental Status: He is alert and oriented to person, place, and time.        Assessment & Plan:   Jeffie was seen today for medical management of chronic issues.  Diagnoses and all orders for this visit:  Type 2 diabetes mellitus with diabetic neuropathy, unspecified whether long term insulin use (HCC) -     Bayer DCA Hb A1c Waived  Essential hypertension -     CBC with Differential/Platelet -     CMP14+EGFR  Dyslipidemia -     Lipid panel  Other orders -      silodosin (RAPAFLO) 4 MG CAPS capsule; Take 1 capsule (4 mg total) by mouth daily with breakfast.      I am having Rosa A. Rowe Robert maintain his multivitamin with minerals, Calcium Carb-Cholecalciferol (CALCIUM 600 + D PO), Coenzyme Q10, meclizine, apixaban, amLODipine, metFORMIN, omeprazole, rosuvastatin, lisinopril, colestipol, and silodosin.  Meds ordered this encounter  Medications   silodosin (RAPAFLO) 4 MG CAPS capsule    Sig: Take 1 capsule (4 mg total) by mouth daily with breakfast.    Dispense:  90 capsule    Refill:  3   Await Derm opinion on rash  Follow-up: Return in about 6 months (around 04/20/2022) for diabetes.  Claretta Fraise, M.D.

## 2021-10-21 LAB — CMP14+EGFR
ALT: 18 IU/L (ref 0–44)
AST: 31 IU/L (ref 0–40)
Albumin/Globulin Ratio: 2 (ref 1.2–2.2)
Albumin: 4.4 g/dL (ref 3.7–4.7)
Alkaline Phosphatase: 70 IU/L (ref 44–121)
BUN/Creatinine Ratio: 18 (ref 10–24)
BUN: 12 mg/dL (ref 8–27)
Bilirubin Total: 1.2 mg/dL (ref 0.0–1.2)
CO2: 26 mmol/L (ref 20–29)
Calcium: 9 mg/dL (ref 8.6–10.2)
Chloride: 99 mmol/L (ref 96–106)
Creatinine, Ser: 0.68 mg/dL — ABNORMAL LOW (ref 0.76–1.27)
Globulin, Total: 2.2 g/dL (ref 1.5–4.5)
Glucose: 97 mg/dL (ref 70–99)
Potassium: 4.1 mmol/L (ref 3.5–5.2)
Sodium: 138 mmol/L (ref 134–144)
Total Protein: 6.6 g/dL (ref 6.0–8.5)
eGFR: 92 mL/min/{1.73_m2} (ref >59)

## 2021-10-21 LAB — CBC WITH DIFFERENTIAL/PLATELET
Basophils Absolute: 0 10*3/uL (ref 0.0–0.2)
Basos: 1 %
EOS (ABSOLUTE): 0.1 10*3/uL (ref 0.0–0.4)
Eos: 3 %
Hematocrit: 42.4 % (ref 37.5–51.0)
Hemoglobin: 14.7 g/dL (ref 13.0–17.7)
Immature Grans (Abs): 0 10*3/uL (ref 0.0–0.1)
Immature Granulocytes: 0 %
Lymphocytes Absolute: 0.9 10*3/uL (ref 0.7–3.1)
Lymphs: 21 %
MCH: 32.6 pg (ref 26.6–33.0)
MCHC: 34.7 g/dL (ref 31.5–35.7)
MCV: 94 fL (ref 79–97)
Monocytes Absolute: 0.6 10*3/uL (ref 0.1–0.9)
Monocytes: 13 %
Neutrophils Absolute: 2.7 10*3/uL (ref 1.4–7.0)
Neutrophils: 62 %
Platelets: 197 10*3/uL (ref 150–450)
RBC: 4.51 x10E6/uL (ref 4.14–5.80)
RDW: 12.8 % (ref 11.6–15.4)
WBC: 4.4 10*3/uL (ref 3.4–10.8)

## 2021-10-21 LAB — LIPID PANEL
Chol/HDL Ratio: 1.7 ratio (ref 0.0–5.0)
Cholesterol, Total: 92 mg/dL — ABNORMAL LOW (ref 100–199)
HDL: 54 mg/dL (ref >39)
LDL Chol Calc (NIH): 24 mg/dL (ref 0–99)
Triglycerides: 58 mg/dL (ref 0–149)
VLDL Cholesterol Cal: 14 mg/dL (ref 5–40)

## 2021-10-22 DIAGNOSIS — L400 Psoriasis vulgaris: Secondary | ICD-10-CM | POA: Diagnosis not present

## 2021-10-22 NOTE — Progress Notes (Signed)
Hello Aristide,  Your lab result is normal and/or stable.Some minor variations that are not significant are commonly marked abnormal, but do not represent any medical problem for you.  Best regards, Edahi Kroening, M.D.

## 2021-11-12 DIAGNOSIS — L400 Psoriasis vulgaris: Secondary | ICD-10-CM | POA: Diagnosis not present

## 2021-11-18 ENCOUNTER — Other Ambulatory Visit (HOSPITAL_COMMUNITY)
Admission: RE | Admit: 2021-11-18 | Discharge: 2021-11-18 | Disposition: A | Discharge status: Home / Self Care | Payer: Medicare Other | Source: Ambulatory Visit | Attending: Dermatology | Admitting: Dermatology

## 2021-11-18 DIAGNOSIS — Z79899 Other long term (current) drug therapy: Secondary | ICD-10-CM | POA: Diagnosis not present

## 2021-11-18 DIAGNOSIS — L400 Psoriasis vulgaris: Secondary | ICD-10-CM | POA: Diagnosis not present

## 2021-11-18 LAB — COMPREHENSIVE METABOLIC PANEL
ALT: 22 U/L (ref 0–44)
AST: 38 U/L (ref 15–41)
Albumin: 3.9 g/dL (ref 3.5–5.0)
Alkaline Phosphatase: 62 U/L (ref 38–126)
Anion gap: 6 (ref 5–15)
BUN: 13 mg/dL (ref 8–23)
CO2: 27 mmol/L (ref 22–32)
Calcium: 8.5 mg/dL — ABNORMAL LOW (ref 8.9–10.3)
Chloride: 105 mmol/L (ref 98–111)
Creatinine, Ser: 0.6 mg/dL — ABNORMAL LOW (ref 0.61–1.24)
GFR, Estimated: >60 mL/min (ref >60)
Glucose, Bld: 93 mg/dL (ref 70–99)
Potassium: 3.6 mmol/L (ref 3.5–5.1)
Sodium: 138 mmol/L (ref 135–145)
Total Bilirubin: 1 mg/dL (ref 0.3–1.2)
Total Protein: 6.8 g/dL (ref 6.5–8.1)

## 2021-11-18 LAB — CBC WITH DIFFERENTIAL/PLATELET
Abs Immature Granulocytes: 0.02 10*3/uL (ref 0.00–0.07)
Basophils Absolute: 0 10*3/uL (ref 0.0–0.1)
Basophils Relative: 1 %
Eosinophils Absolute: 0.1 10*3/uL (ref 0.0–0.5)
Eosinophils Relative: 2 %
HCT: 41.2 % (ref 39.0–52.0)
Hemoglobin: 13.8 g/dL (ref 13.0–17.0)
Immature Granulocytes: 0 %
Lymphocytes Relative: 17 %
Lymphs Abs: 0.8 10*3/uL (ref 0.7–4.0)
MCH: 32.4 pg (ref 26.0–34.0)
MCHC: 33.5 g/dL (ref 30.0–36.0)
MCV: 96.7 fL (ref 80.0–100.0)
Monocytes Absolute: 0.6 10*3/uL (ref 0.1–1.0)
Monocytes Relative: 13 %
Neutro Abs: 3.2 10*3/uL (ref 1.7–7.7)
Neutrophils Relative %: 67 %
Platelets: 216 10*3/uL (ref 150–400)
RBC: 4.26 MIL/uL (ref 4.22–5.81)
RDW: 13.7 % (ref 11.5–15.5)
WBC: 4.8 10*3/uL (ref 4.0–10.5)
nRBC: 0 % (ref 0.0–0.2)

## 2021-11-22 LAB — QUANTIFERON-TB GOLD PLUS (RQFGPL)
QuantiFERON Mitogen Value: 8.45 IU/mL
QuantiFERON Nil Value: 0.03 IU/mL
QuantiFERON TB1 Ag Value: 0.05 IU/mL
QuantiFERON TB2 Ag Value: 0.04 IU/mL

## 2021-11-22 LAB — QUANTIFERON-TB GOLD PLUS: QuantiFERON-TB Gold Plus: NEGATIVE

## 2021-11-25 DIAGNOSIS — I340 Nonrheumatic mitral (valve) insufficiency: Secondary | ICD-10-CM | POA: Insufficient documentation

## 2021-11-25 NOTE — Progress Notes (Signed)
CC:    Mitral Valve Repair.   HPI  The patient presents for evaluation of mitral valve repair.  Echo 10/2015 demonstrated stable MV repair.  While in the hospital in 2017 for knee surgery he developed persistent atrial fib.  He had cardioversion but went back into atrial fib.  He wore a Holter monitor which demonstrated frequent 3.5 second pauses at night. His average heart rate was about 80.   He is treated for sleep apnea.  In 2019 he had some double vision and was told he could have had a "small stroke."  His INR was therapeutic. Follow up echo was normal.  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  He was also seen by neurology.    He return for follow up.  He has lost about 30 pounds or more with weight watchers! The patient denies any new symptoms such as chest discomfort, neck or arm discomfort. There has been no new shortness of breath, PND or orthopnea. There have been no reported palpitations, presyncope or syncope.  He has had some stomach issues but mostly he is now bothered by psoriasis.  He is about to start methotrexate.   He had 1 episode where the coffee cup ended up on the floor across the room from him and he had no idea why.  He does not think he lost consciousness.  He has not had any other episodes of this.  Allergies  Allergen Reactions   Flomax [Tamsulosin Hcl] Other (See Comments)    Makes him "swimmy headed"    Current Outpatient Medications  Medication Sig Dispense Refill   amLODipine (NORVASC) 5 MG tablet Take 1 tablet (5 mg total) by mouth daily. 90 tablet 3   apixaban (ELIQUIS) 5 MG TABS tablet Take 1 tablet (5 mg total) by mouth 2 (two) times daily. 180 tablet 1   Calcium Carb-Cholecalciferol (CALCIUM 600 + D PO) Take 1 tablet by mouth daily.     Coenzyme Q10 300 MG CAPS Take 300 mg by mouth daily.     colestipol (COLESTID) 1 g tablet Take 2 tablets (2 g total) by mouth daily. take it 4 hours apart  from his other medications 180 tablet 1   lisinopril (ZESTRIL) 20 MG tablet TAKE 1/2 TABLET BY MOUTH EVERYDAY AT BEDTIME 90 tablet 2   meclizine (ANTIVERT) 25 MG tablet Take 25 mg by mouth 3 (three) times daily as needed for dizziness.     metFORMIN (GLUCOPHAGE-XR) 500 MG 24 hr tablet TAKE 1 TABLET BY MOUTH EVERY DAY WITH BREAKFAST 90 tablet 3   methotrexate (RHEUMATREX) 2.5 MG tablet Take 10 mg by mouth once a week.     Multiple Vitamin (MULTIVITAMIN WITH MINERALS) TABS tablet Take 1 tablet by mouth daily.     omeprazole (PRILOSEC) 40 MG capsule Take 1 capsule (40 mg total) by mouth daily. 90 capsule 3   rosuvastatin (CRESTOR) 5 MG tablet Take 1 tablet (5 mg total) by mouth daily. For cholesterol 90 tablet 3   silodosin (RAPAFLO) 4 MG CAPS capsule Take 1 capsule (4 mg total) by mouth daily with breakfast. 90 capsule 3   No current facility-administered medications for this visit.    Past Medical History:  Diagnosis Date   Arthritis    knees, shoulder,  hips (10/24/2017)   Atrial fibrillation (HCC)    on eliquis   BPH (benign prostatic hyperplasia)    Cataracts, bilateral    removed by surgery  Coronary artery disease excluded    Diabetes mellitus without complication (HCC)    type 2   Diverticulosis    Endocarditis, valve unspecified, unspecified cause    GERD (gastroesophageal reflux disease)    H/O mitral valve repair    Postoperative ring with postoperative atrial fibrillation   Hearing loss    both ears - does not use hearing aids   Heart murmur    hx   Hiatal hernia 06/13/2002   High cholesterol    History of kidney stones    passed spontaneously x2    OSA on CPAP    uses cpap nightly   Stroke [facility])    Unspecified essential hypertension    Weakness of both arms 07/19/2012   Wears dentures    upper and lower partial    Past Surgical History:  Procedure Laterality Date   CARDIAC CATHETERIZATION  02/14/2007   x 2   CARDIOVERSION N/A 04/14/2016   Procedure:  CARDIOVERSION;  Surgeon: Minus Breeding, MD;  Location: [place];  Service: Cardiovascular;  Laterality: N/A;   CATARACT EXTRACTION W/PHACO Right 05/12/2015   Procedure: CATARACT EXTRACTION PHACO AND INTRAOCULAR LENS PLACEMENT RIGHT EYE CDE=7.75;  Surgeon: Tonny Branch, MD;  Location: [facility];  Service: Ophthalmology;  Laterality: Right;   CATARACT EXTRACTION W/PHACO Left 06/12/2015   Procedure: CATARACT EXTRACTION PHACO AND INTRAOCULAR LENS PLACEMENT (IOC);  Surgeon: Tonny Branch, MD;  Location: [facility];  Service: Ophthalmology;  Laterality: Left;  CDE:  13.17   COLONOSCOPY     ERCP N/A 05/21/2021   Procedure: ENDOSCOPIC RETROGRADE CHOLANGIOPANCREATOGRAPHY (ERCP);  Surgeon: Rogene Houston, MD;  Location: [facility];  Service: Gastroenterology;  Laterality: N/A;   ERCP N/A 05/21/2021   Procedure: ENDOSCOPIC RETROGRADE CHOLANGIOPANCREATOGRAPHY (ERCP);  Surgeon: Rogene Houston, MD;  Location: [facility];  Service: Gastroenterology;  Laterality: N/A;   ESOPHAGEAL DILATION N/A 05/21/2021   Procedure: ESOPHAGEAL DILATION;  Surgeon: Rogene Houston, MD;  Location: [facility];  Service: Gastroenterology;  Laterality: N/A;   ESOPHAGOGASTRODUODENOSCOPY N/A 05/21/2021   Procedure: ESOPHAGOGASTRODUODENOSCOPY (EGD);  Surgeon: Rogene Houston, MD;  [facility]: [facility];  Service: Gastroenterology;  Laterality: N/A;   EYE SURGERY     Bilateral cataracts   INJECTION KNEE Right 02/20/2016   Procedure: KNEE INJECTION;  Surgeon: [name], MD;  Location: [place];  Service: Orthopedics;  Laterality: Right;   JOINT REPLACEMENT     KNEE ARTHROSCOPY Left    LAPAROSCOPIC CHOLECYSTECTOMY     MALONEY DILATION N/A 05/21/2021   Procedure: MALONEY DILATION;  Surgeon: Rogene Houston, MD;  Location: [facility];  Service: Gastroenterology;  Laterality: N/A;   MITRAL VALVE REPAIR  ~ 2003   MULTIPLE TOOTH EXTRACTIONS     REMOVAL OF STONES  05/21/2021   Procedure: REMOVAL OF STONES;  Surgeon: Rogene Houston, MD;  Location: [facility];  Service:  Gastroenterology;;   SHOULDER ARTHROSCOPY WITH ROTATOR CUFF REPAIR AND OPEN BICEPS TENODESIS Right 01/26/2019   Procedure: right shoulder arthroscopy, rotator cuff repair and biceps tenodesis;  Surgeon: [name], MD;  Location: [place];  Service: Orthopedics;  Laterality: Right;   SHOULDER OPEN ROTATOR CUFF REPAIR Left    SPHINCTEROTOMY  05/21/2021   Procedure: SPHINCTEROTOMY;  Surgeon: Rogene Houston, MD;  Location: [facility];  Service: Gastroenterology;;   TOTAL KNEE ARTHROPLASTY Left 02/20/2016   Procedure: LEFT TOTAL KNEE ARTHROPLASTY WITH RIGHT KNEE INJECTION;  Surgeon: [name], MD;  Location: [place];  Service: Orthopedics;  Laterality: Left;  TOTAL KNEE ARTHROPLASTY Right 07/06/2019   Procedure: RIGHT TOTAL KNEE ARTHROPLASTY;  Surgeon: [name], MD;  Location: [place];  Service: Orthopedics;  Laterality: Right;   UPPER GI ENDOSCOPY     VASECTOMY      ROS: Positive for GERD.  Otherwise as stated in the HPI and negative for all other systems.  PHYSICAL EXAM BP 126/60   Pulse (!) 53   Ht '5\' 11"'$  (1.803 m)   Wt 191 lb 3.2 oz (86.7 kg)   SpO2 99%   BMI 26.67 kg/m   GENERAL:  Well appearing NECK:  No jugular venous distention, waveform within normal limits, carotid upstroke brisk and symmetric, no bruits, no thyromegaly LUNGS:  Clear to auscultation bilaterally CHEST:  Unremarkable HEART:  PMI not displaced or sustained,S1 and S2 within normal limits, no S3, no S4, no clicks, no rubs, no murmurs, irregular ABD:  Flat, positive bowel sounds normal in frequency in pitch, no bruits, no rebound, no guarding, no midline pulsatile mass, no hepatomegaly, no splenomegaly EXT:  2 plus pulses throughout, no edema, no cyanosis no clubbing   EKG: ATRIAL fibrillation, rate 53, axis within normal limits, intervals within normal limits, low voltage in the limb leads, no acute ST-T wave changes.    ASSESSMENT AND PLAN  ATRIAL FIB:    Mr. NOCHUM FENTER has a CHA2DS2 - VASc of 5.   He  tolerates anticoagulation.  He is having his blood work followed closely.  No change in therapy.  SLEEP APNEA:   He wears a CPAP.  MITRAL VALVE REPAIR:    This was stable last year.  No change in therapy.  I do not think he needs further imaging this year.  He understands endocarditis prophylaxis.  HTN:  The blood pressure is at target.  No change in therapy.  OBESITY:   I am very proud of his weight loss.

## 2021-11-26 ENCOUNTER — Encounter: Payer: Self-pay | Admitting: Cardiology

## 2021-11-26 ENCOUNTER — Ambulatory Visit: Payer: Medicare Other | Attending: Cardiology | Admitting: Cardiology

## 2021-11-26 VITALS — BP 126/60 | HR 53 | Ht 71.0 in | Wt 191.2 lb

## 2021-11-26 DIAGNOSIS — I340 Nonrheumatic mitral (valve) insufficiency: Secondary | ICD-10-CM

## 2021-11-26 DIAGNOSIS — I10 Essential (primary) hypertension: Secondary | ICD-10-CM

## 2021-11-26 DIAGNOSIS — I482 Chronic atrial fibrillation, unspecified: Secondary | ICD-10-CM | POA: Diagnosis not present

## 2021-11-26 NOTE — Patient Instructions (Signed)
Medication Instructions:  Your physician recommends that you continue on your current medications as directed. Please refer to the Current Medication list given to you today.  *If you need a refill on your cardiac medications before your next appointment, please call your pharmacy*   Lab Work: NONE ordered at this time of appointment  If you have labs (blood work) drawn today and your tests are completely normal, you will receive your results only by: [place] (if you have MyChart) OR A paper copy in the mail If you have any lab test that is abnormal or we need to change your treatment, we will call you to review the results.   Testing/Procedures: NONE ordered at this time of appointment    Follow-Up: At [facility], you and your health needs are our priority.  As part of our continuing mission to provide you with exceptional heart care, we have created designated Provider Care Teams.  These Care Teams include your primary Cardiologist (physician) and Advanced Practice Providers (APPs -  Physician Assistants and Nurse Practitioners) who all work together to provide you with the care you need, when you need it.  We recommend signing up for the patient portal called "MyChart".  Sign up information is provided on this After Visit Summary.  MyChart is used to connect with patients for Virtual Visits (Telemedicine).  Patients are able to view lab/test results, encounter notes, upcoming appointments, etc.  Non-urgent messages can be sent to your provider as well.   To learn more about what you can do with MyChart, go to [email or web address].    Your next appointment:   1 year(s)  The format for your next appointment:   In Person  Provider:   Minus Breeding, MD     Important Information About Sugar

## 2021-12-03 ENCOUNTER — Ambulatory Visit: Payer: Medicare Other | Attending: Cardiovascular Disease | Admitting: Cardiovascular Disease

## 2021-12-03 ENCOUNTER — Encounter: Payer: Self-pay | Admitting: Cardiovascular Disease

## 2021-12-03 VITALS — BP 116/58 | HR 58 | Ht 71.0 in | Wt 191.8 lb

## 2021-12-03 DIAGNOSIS — E118 Type 2 diabetes mellitus with unspecified complications: Secondary | ICD-10-CM | POA: Diagnosis not present

## 2021-12-03 DIAGNOSIS — E7800 Pure hypercholesterolemia, unspecified: Secondary | ICD-10-CM | POA: Diagnosis not present

## 2021-12-03 DIAGNOSIS — Z9889 Other specified postprocedural states: Secondary | ICD-10-CM | POA: Diagnosis not present

## 2021-12-03 DIAGNOSIS — G4733 Obstructive sleep apnea (adult) (pediatric): Secondary | ICD-10-CM

## 2021-12-03 DIAGNOSIS — I10 Essential (primary) hypertension: Secondary | ICD-10-CM

## 2021-12-03 DIAGNOSIS — I482 Chronic atrial fibrillation, unspecified: Secondary | ICD-10-CM

## 2021-12-03 NOTE — Progress Notes (Signed)
Cardiology Office Note    Date:  12/05/2021   ID:  AADEN [name], DOB 09/01/1935, MRN [id number]  PCP:  Claretta Fraise, MD  Cardiologist:  Shelva Majestic, MD (sleep); Dr. Percival Spanish  F/U sleep clinic evaluation  History of Present Illness:  [name] is a 86 y.o. male who presents for a 18 month follow-up sleep clinic evaluation following initiation of CPAP therapy for sleep apnea.  I last saw him on May 23, 2020.  [name] is followed by Dr. Percival Spanish for primary cardiology care.  He has a history of mitral valve repair as well as atrial fibrillation.  He had undergone DC cardioversion, but subsequently, he developed recurrent AF.  Due to concerns for sleep apnea he was referred for a sleep study and was found.  to have severe obstructive sleep apnea with an AHI of 55.4 per hour.  AHI during REM sleep was 57.5 per hour.  He had significant oxygen desaturation to 80% with non-REM and 72% during REM sleep.  There was loud snoring.  CPAP was implemented and was titrated to an optimal pressure of 14 cm.  His CPAP set up date was 08/23/2016 and he has a ResMed air since 10 AutoSet unit.  He has been using a ResMed airFit F 20 medium size mask.  A download was obtained from August 26 to 11/08/2016.  His compliance is excellent at 100%.  He is averaging 8 hours and 39 minutes per night of sleep.  At a set pressure of 14 cm, however, AHI was still mildly elevated at 6.5, with an apnea index of 5.6.  I saw him for initial sleep clinic evaluation in September 2018 at which time he had noted a significant improvement since CPAP initiation.  Previously he had experienced loud snoring and nocturia at least 3-4 times per night.  He now is unaware of any snoring.  Most nights he can sleep without urination but at times he wakes up 1 time per night.  He feels more refreshed.  His sleep is restorative.  He denies any daytime sleepiness,  bruxism, restless legs, hypnogognic hallucinations, or  cataplexy.  An Epworth Sleepiness Scale score was calculated in the office which endorsed at 8 shown below.  Epworth Sleepiness Scale: Situation   Chance of Dozing/Sleeping (0 = never , 1 = slight chance , 2 = moderate chance , 3 = high chance )   sitting and reading 1   watching TV 3   sitting inactive in a public place 0   being a passenger in a motor vehicle for an hour or more 0   lying down in the afternoon 2   sitting and talking to someone 0   sitting quietly after lunch (no alcohol) 2   while stopped for a few minutes in traffic as the driver 0   Total Score  8   I saw him for 1 year follow-up in December 2019 at which time he was doing well.  [name] has continued to derive benefit from CPAP therapy.  He typically goes to bed around 10 PM but often times turns the TV off at 11 PM.  He typically wakes up between 7 or 8 AM.  He believes he is sleeping well.  If situations permit he does take a daily nap.  A new Epworth scale was calculated in the office today and this endorsed at 6.  At times he has had some difficulty with the pressure  on the bridge of his nose from his full facemask.  During that evaluation, a new download was obtained from November 18 through January 31, 2018.  Compliance remained excellent and his auto pressure ranged from 12 to 20 cm of water and AHI was 6.2.  He was requiring almost maximal pressure with his 95 percentile pressure at 19.6.  With calluses on the bridge of his nose secondary to his full facemask I have suggested transition to the new DreamWear technology.  When I saw him in March 2021 he was doing well from a sleep perspective.  He has the DreamWear mask but he has had irritation resulting from the facial cushion.  A new download from February 15 through May 01, 2019 was obtained.  Compliance remains excellent but average sleep duration was 6 hours and 14 minutes.  His CPAP Auto unit was set at a range of 16-20 and again his 95th percentile pressure  was near maximum at 19.8.  He believes he is sleeping well.  He is unaware of breakthrough snoring.  I calculated a new Epworth Sleepiness Scale score today in the office and this endorsed at 5 arguing against residual daytime sleepiness.  He has continued to be on amlodipine 5 mg and lisinopril 20 mg for hypertension.  He has atrial fibrillation with a controlled rate and continues to be on anticoagulation with Eliquis without bleeding.  He is diabetic on metformin.  He has been on low-dose rosuvastatin for hyperlipidemia with target LDL less than 70.   I last saw him on May 23, 2020 and over the prior year he continues to do well from a sleep perspective. A new download from April 23, 2020 through May 22, 2020 demonstrated excellent compliance with average CPAP use at 8 hours and 23 minutes.  He has required high pressures with his minimum pressure at 16 and maximal 20 cm, and his 95th percentile pressure is 19.7 with maximum average pressure at 19.9.  An Epworth Sleepiness Scale score was calculated in the office today and this endorsed at 4 arguing against residual daytime sleepiness.  His atrial fibrillation rate has been controlled.  He denied any chest pain.  He was on amlodipine 5 mg, lisinopril 20 mg for hypertension, Eliquis 5 mg twice a day for anticoagulation, and rosuvastatin 5 mg for hyperlipidemia with LDL cholesterol at 44.  He is diabetic on Metformin.  During that evaluation his blood pressure was stable and his atrial fibrillation rate was well controlled.  Since I last saw him, he has had a 50 pound weight loss.  He has continued to have issues with significant psoriasis.  He has continued to use CPAP but has not been as compliant as he had been in the past.  I obtained a download from September 17 through November 30, 2021.  Usage days was 83%.  Usage duration average 6 hours and 34 minutes.  He has required high pressure in his range his been 7 to 20 cm with AHI of 1.3.  His 95th percentile  pressure is 19.7 with maximum average pressure at 19.8.  He has not used CPAP over the past 4 days and felt that perhaps the pressure may have been too high.  A download did not reveal any leak.  His DME company will be changing to [place].  He continues to be on lisinopril 10 mg, amlodipine 5 mg for hypertension.  He is on methotrexate for his psoriatic arthritis.  He is diabetic on metformin.  He is  on rosuvastatin for his lipids.  He presents for follow-up evaluation.  Past Medical History:  Diagnosis Date   Arthritis    knees, shoulder,  hips (10/24/2017)   Atrial fibrillation (HCC)    on eliquis   BPH (benign prostatic hyperplasia)    Cataracts, bilateral    removed by surgery   Coronary artery disease excluded    Diabetes mellitus without complication ([place])    type 2   Diverticulosis    Endocarditis, valve unspecified, unspecified cause    GERD (gastroesophageal reflux disease)    H/O mitral valve repair    Postoperative ring with postoperative atrial fibrillation   Hearing loss    both ears - does not use hearing aids   Heart murmur    hx   Hiatal hernia 06/13/2002   High cholesterol    History of kidney stones    passed spontaneously x2    OSA on CPAP    uses cpap nightly   Stroke ([place])    Unspecified essential hypertension    Weakness of both arms 07/19/2012   Wears dentures    upper and lower partial    Past Surgical History:  Procedure Laterality Date   CARDIAC CATHETERIZATION  02/14/2007   x 2   CARDIOVERSION N/A 04/14/2016   Procedure: CARDIOVERSION;  Surgeon: Minus Breeding, MD;  Location: [facility];  Service: Cardiovascular;  Laterality: N/A;   CATARACT EXTRACTION W/PHACO Right 05/12/2015   Procedure: CATARACT EXTRACTION PHACO AND INTRAOCULAR LENS PLACEMENT RIGHT EYE CDE=7.75;  Surgeon: Tonny Branch, MD;  Location: [facility];  Service: Ophthalmology;  Laterality: Right;   CATARACT EXTRACTION W/PHACO Left 06/12/2015   Procedure: CATARACT EXTRACTION PHACO AND  INTRAOCULAR LENS PLACEMENT (IOC);  Surgeon: Tonny Branch, MD;  Location: [facility];  Service: Ophthalmology;  Laterality: Left;  CDE:  13.17   COLONOSCOPY     ERCP N/A 05/21/2021   Procedure: ENDOSCOPIC RETROGRADE CHOLANGIOPANCREATOGRAPHY (ERCP);  Surgeon: Rogene Houston, MD;  Location: [facility];  Service: Gastroenterology;  Laterality: N/A;   ERCP N/A 05/21/2021   Procedure: ENDOSCOPIC RETROGRADE CHOLANGIOPANCREATOGRAPHY (ERCP);  Surgeon: Rogene Houston, MD;  [facility];  Service: Gastroenterology;  Laterality: N/A;   ESOPHAGEAL DILATION N/A 05/21/2021   Procedure: ESOPHAGEAL DILATION;  Surgeon: Rogene Houston, MD;  [facility];  Service: Gastroenterology;  Laterality: N/A;   ESOPHAGOGASTRODUODENOSCOPY N/A 05/21/2021   Procedure: ESOPHAGOGASTRODUODENOSCOPY (EGD);  Surgeon: Rogene Houston, MD;  [facility];  Service: Gastroenterology;  Laterality: N/A;   EYE SURGERY     Bilateral cataracts   INJECTION KNEE Right 02/20/2016   Procedure: KNEE INJECTION;  Surgeon: [name], MD;  Location: [facility];  Service: Orthopedics;  Laterality: Right;   JOINT REPLACEMENT     KNEE ARTHROSCOPY Left    LAPAROSCOPIC CHOLECYSTECTOMY     MALONEY DILATION N/A 05/21/2021   Procedure: MALONEY DILATION;  Surgeon: Rogene Houston, MD;  [facility]: [facility];  Service: Gastroenterology;  Laterality: N/A;   MITRAL VALVE REPAIR  ~ 2003   MULTIPLE TOOTH EXTRACTIONS     REMOVAL OF STONES  05/21/2021   Procedure: REMOVAL OF STONES;  Surgeon: Rogene Houston, MD;  Location: [facility];  Service: Gastroenterology;;   SHOULDER ARTHROSCOPY WITH ROTATOR CUFF REPAIR AND OPEN BICEPS TENODESIS Right 01/26/2019   Procedure: right shoulder arthroscopy, rotator cuff repair and biceps tenodesis;  Surgeon: [name], MD;  Location: [facility];  Service: Orthopedics;  Laterality: Right;   SHOULDER OPEN ROTATOR CUFF REPAIR Left  SPHINCTEROTOMY  05/21/2021   Procedure: SPHINCTEROTOMY;  Surgeon: Rogene Houston, MD;  [facility];   Service: Gastroenterology;;   TOTAL KNEE ARTHROPLASTY Left 02/20/2016   Procedure: LEFT TOTAL KNEE ARTHROPLASTY WITH RIGHT KNEE INJECTION;  Surgeon: [name], MD;  Location: [facility];  Service: Orthopedics;  Laterality: Left;   TOTAL KNEE ARTHROPLASTY Right 07/06/2019   Procedure: RIGHT TOTAL KNEE ARTHROPLASTY;  Surgeon: [name], MD;  Location: [facility];  Service: Orthopedics;  Laterality: Right;   UPPER GI ENDOSCOPY     VASECTOMY      Current Medications: Outpatient Medications Prior to Visit  Medication Sig Dispense Refill   amLODipine (NORVASC) 5 MG tablet Take 1 tablet (5 mg total) by mouth daily. 90 tablet 3   apixaban (ELIQUIS) 5 MG TABS tablet Take 1 tablet (5 mg total) by mouth 2 (two) times daily. 180 tablet 1   Calcium Carb-Cholecalciferol (CALCIUM 600 + D PO) Take 1 tablet by mouth daily.     Coenzyme Q10 300 MG CAPS Take 300 mg by mouth daily.     colestipol (COLESTID) 1 g tablet Take 2 tablets (2 g total) by mouth daily. take it 4 hours apart from his other medications 180 tablet 1   lisinopril (ZESTRIL) 20 MG tablet TAKE 1/2 TABLET BY MOUTH EVERYDAY AT BEDTIME 90 tablet 2   meclizine (ANTIVERT) 25 MG tablet Take 25 mg by mouth 3 (three) times daily as needed for dizziness.     metFORMIN (GLUCOPHAGE-XR) 500 MG 24 hr tablet TAKE 1 TABLET BY MOUTH EVERY DAY WITH BREAKFAST 90 tablet 3   methotrexate (RHEUMATREX) 2.5 MG tablet Take 10 mg by mouth once a week.     Multiple Vitamin (MULTIVITAMIN WITH MINERALS) TABS tablet Take 1 tablet by mouth daily.     omeprazole (PRILOSEC) 40 MG capsule Take 1 capsule (40 mg total) by mouth daily. 90 capsule 3   rosuvastatin (CRESTOR) 5 MG tablet Take 1 tablet (5 mg total) by mouth daily. For cholesterol 90 tablet 3   silodosin (RAPAFLO) 4 MG CAPS capsule Take 1 capsule (4 mg total) by mouth daily with breakfast. 90 capsule 3   No facility-administered medications prior to visit.     Allergies:   Flomax [tamsulosin hcl]   Social History    Socioeconomic History   Marital status: Married    Spouse name: GLORIA   Number of children: 2   Years of education: 12   Highest education level: High school graduate  Occupational History   Occupation:  Environmental manager    Comment: RETIRED  Tobacco Use   Smoking status: Former    Packs/day: 3.00    Years: 35.00    Total pack years: 105.00    Types: Cigarettes    Quit date: 02/16/1988    Years since quitting: 33.8    Passive exposure: Past   Smokeless tobacco: Never   Tobacco comments:     Year Quit: 1990   Vaping Use   Vaping Use: Never used  Substance and Sexual Activity   Alcohol use: No   Drug use: Never   Sexual activity: Not Currently  Other Topics Concern   Not on file  Social History Narrative   Not on file   Social Determinants of Health   Financial Resource Strain: Low Risk  (02/12/2021)   Overall Financial Resource Strain (CARDIA)    Difficulty of Paying Living Expenses: Not hard at all  Food Insecurity: No Food Insecurity (02/12/2021)  Hunger Vital Sign    Worried About Running Out of Food in the Last Year: Never true    Ran Out of Food in the Last Year: Never true  Transportation Needs: No Transportation Needs (02/12/2021)   PRAPARE - Hydrologist (Medical): No    Lack of Transportation (Non-Medical): No  Physical Activity: Sufficiently Active (02/12/2021)   Exercise Vital Sign    Days of Exercise per Week: 4 days    Minutes of Exercise per Session: 50 min  Stress: No Stress Concern Present (02/12/2021)   [place]    Feeling of Stress : Not at all  Social Connections: [place] (02/12/2021)   Social Connection and Isolation Panel [NHANES]    Frequency of Communication with Friends and Family: More than three times a week    Frequency of Social Gatherings with Friends and Family: More than three times a week    Attends Religious Services:  More than 4 times per year    Active Member of [employer] or Organizations: Yes    Attends Music therapist: More than 4 times per year    Marital Status: Married     Family History:  The patient's family history includes Congestive Heart Failure in his father; Diabetes Mellitus II in his brother and sister; Heart disease in his father; Lung cancer in his brother; Stroke in his brother, brother, sister, and sister.   ROS General: Negative; No fevers, chills, or night sweats; purposeful weight loss HEENT: Negative; No changes in vision or hearing, sinus congestion, difficulty swallowing Pulmonary: Negative; No cough, wheezing, shortness of breath, hemoptysis Cardiovascular: Status post mitral valve repair.  Atrial fibrillation GI: Negative; No nausea, vomiting, diarrhea, or abdominal pain GU: Negative; No dysuria, hematuria, or difficulty voiding Musculoskeletal: Negative; no myalgias, joint pain, or weakness Hematologic/Oncology: Negative; no easy bruising, bleeding Endocrine: Negative; no heat/cold intolerance; no diabetes Neuro: Negative; no changes in balance, headaches Skin: Negative; No rashes or skin lesions Psychiatric: Negative; No behavioral problems, depression Sleep: See history of present illness  Other comprehensive 14 point system review is negative.   PHYSICAL EXAM:   VS:  BP (!) 116/58   Pulse (!) 58   Ht '5\' 11"'  (1.803 m)   Wt 191 lb 12.8 oz (87 kg)   SpO2 97%   BMI 26.75 kg/m     Repeat blood pressure by me was 118/60  Wt Readings from Last 3 Encounters:  12/03/21 191 lb 12.8 oz (87 kg)  11/26/21 191 lb 3.2 oz (86.7 kg)  10/20/21 191 lb 3.2 oz (86.7 kg)    General: Alert, oriented, no distress.  Skin: Psoriatic arthritic lesions HEENT: Normocephalic, atraumatic. Pupils equal round and reactive to light; sclera anicteric; extraocular muscles intact;  Nose without nasal septal hypertrophy Mouth/Parynx benign; Mallinpatti scale 3 Neck: No JVD, no  carotid bruits; normal carotid upstroke Lungs: clear to ausculatation and percussion; no wheezing or rales Chest wall: without tenderness to palpitation Heart: PMI not displaced, atrial fibrillation with rate control in the 50s and 60s, s1 s2 normal, 1/6 systolic murmur, no diastolic murmur, no rubs, gallops, thrills, or heaves Abdomen: Large diastases recti; soft, nontender; no hepatosplenomehaly, BS+; abdominal aorta nontender and not dilated by palpation. Back: no CVA tenderness Pulses 2+ Musculoskeletal: full range of motion, normal strength, no joint deformities Extremities: no clubbing cyanosis or edema, Homan's sign negative  Neurologic: grossly nonfocal; Cranial nerves grossly wnl Psychologic: Normal mood and  affect   Studies/Labs Reviewed:   I personally reviewed the ECG from November 26, 2021 which shows atrial fibrillation at 53 bpm.  Low voltage.  No ST segment changes.  May 23, 2020 ECG (independently read by me): Atrial fibrillation at 62, right axis deviation  I personally reviewed the most recent ECG done by Dr. Percival Spanish from December 01, 2018 which showed atrial fibrillation at 66 bpm; QTc interval 413 ms  Septemper 2018 ECG (independently read by me):  Atrial fibrillation at 63 bpm.  QTc interval 407 msec  Recent Labs:    Latest Ref Rng & Units 11/18/2021   10:26 AM 10/20/2021   10:50 AM 07/15/2021   10:23 AM  BMP  Glucose 70 - 99 mg/dL 93  97  110   BUN 8 - 23 mg/dL '13  12  11   ' Creatinine 0.61 - 1.24 mg/dL 0.60  0.68  0.75   BUN/Creat Ratio 10 - '24  18  15   ' Sodium 135 - 145 mmol/L 138  138  144   Potassium 3.5 - 5.1 mmol/L 3.6  4.1  4.3   Chloride 98 - 111 mmol/L 105  99  103   CO2 22 - 32 mmol/L '27  26  29   ' Calcium 8.9 - 10.3 mg/dL 8.5  9.0  9.1         Latest Ref Rng & Units 11/18/2021   10:26 AM 10/20/2021   10:50 AM 07/15/2021   10:23 AM  Hepatic Function  Total Protein 6.5 - 8.1 g/dL 6.8  6.6  7.1   Albumin 3.5 - 5.0 g/dL 3.9  4.4  4.6   AST 15 - 41  U/L 38  31  17   ALT 0 - 44 U/L '22  18  19   ' Alk Phosphatase 38 - 126 U/L 62  70  84   Total Bilirubin 0.3 - 1.2 mg/dL 1.0  1.2  0.9        Latest Ref Rng & Units 11/18/2021   10:26 AM 10/20/2021   10:50 AM 07/15/2021   10:23 AM  CBC  WBC 4.0 - 10.5 K/uL 4.8  4.4  6.0   Hemoglobin 13.0 - 17.0 g/dL 13.8  14.7  14.8   Hematocrit 39.0 - 52.0 % 41.2  42.4  42.8   Platelets 150 - 400 K/uL 216  197  237    Lab Results  Component Value Date   MCV 96.7 11/18/2021   MCV 94 10/20/2021   MCV 93 07/15/2021   Lab Results  Component Value Date   TSH 2.04 08/17/2021   Lab Results  Component Value Date   HGBA1C 5.6 10/20/2021     BNP    Component Value Date/Time   BNP 75.4 03/21/2019 0921    ProBNP No results found for: "PROBNP"   Lipid Panel     Component Value Date/Time   CHOL 92 (L) 10/20/2021 1050   TRIG 58 10/20/2021 1050   HDL 54 10/20/2021 1050   CHOLHDL 1.7 10/20/2021 1050   CHOLHDL 4.5 10/25/2017 0354   VLDL 37 10/25/2017 0354   LDLCALC 24 10/20/2021 1050     RADIOLOGY: No results found.   Additional studies/ records that were reviewed today include:  I reviewed the records from Dr. Percival Spanish.  I reviewed his prior sleep study and obtain a new download to assess efficacy and compliance and obtain a new Epworth Sleepiness Scale score today.  ASSESSMENT:    1. OSA (obstructive sleep  apnea)   2. Chronic atrial fibrillation (HCC)   3. Essential hypertension   4. H/O mitral valve repair   5. Type 2 diabetes mellitus with complication, without long-term current use of insulin (HCC)   6. Pure hypercholesterolemia     PLAN:  Mr. Kirsch is a very pleasant 86 year-old gentleman who has cardiovascular comorbidities including mitral valve repair, permanent atrial fibrillation, hypertension, and mild carotid plaque.  He had undergone cardioversion for atrial fibrillation, but unfortunately develop recurrent atrial fibrillation and is now in permanent atrial  fibrillation. He was found to have severe obstructive sleep apnea on the baseline portion of his split-night sleep evaluation with significant oxygen desaturation to a nadir of 72% during REM sleep.  He has been on CPAP therapy since his set up date of August 23, 2016.  He has been at near maximum CPAP pressure and consistently has 95th percentile pressure has ranged aroundt 19.7-19.8 over the last several years with maximum pressure at 19.9.  AHI on his most recent download is 3.3.  At a prior evaluation I changed  ramp start pressure to 12 and slightly change his auto pressure such that he will initiate CPAP at 17 up to its maximum of 20.  We discussed if he does in the future require higher pressures we will need to transition him to BiPAP therapy which has a maximum IPAP pressure of 25 cm of water.  He is using the ResMed air fit F 30i which is doing well and which was switched at his last evaluation.  He has had success with weight loss and admits to over 40 pounds weight reduction.  His most recent download shows he continues to be compliant but he he did not use therapy for 3 days in early October and has not used treatment for the last 4 days.  I reviewed his download in detail with him.  Again his 95th percentile pressure is 19.7 with maximum average pressure 19.8.  He does not have any mask leak.  I discussed with him that his pressure will only increase to its maximum if he requires increased pressure.  His download is consistent with prior downloads.  I again reiterated the importance that he use CPAP every night and aim for usage duration at least 7 to 8 hours if at all possible.  He typically goes to bed at 10 PM and wakes up at 7 AM.  I again discussed implications of untreated sleep apnea with reference to blood pressure control, nocturnal palpitations, he already is in permanent atrial fibrillation which I suspect may have been significantly contributed by his sleep apnea and I discussed implications  with reference to insulin resistance, inflammatory markers, as well as potential for nocturnal hypoxemia contributing to ischemia.  He will follow-up with Dr. Percival Spanish.  I will see him in 1 year for reevaluation or sooner as needed.   Medication Adjustments/Labs and Tests Ordered: Current medicines are reviewed at length with the patient today.  Concerns regarding medicines are outlined above.  Medication changes, Labs and Tests ordered today are listed in the Patient Instructions below. Patient Instructions  Follow-Up: At [facility], you and your health needs are our priority.  As part of our continuing mission to provide you with exceptional heart care, we have created designated Provider Care Teams.  These Care Teams include your primary Cardiologist (physician) and Advanced Practice Providers (APPs -  Physician Assistants and Nurse Practitioners) who all work together to provide you with the care  you need, when you need it.  We recommend signing up for the patient portal called "MyChart".  Sign up information is provided on this After Visit Summary.  MyChart is used to connect with patients for Virtual Visits (Telemedicine).  Patients are able to view lab/test results, encounter notes, upcoming appointments, etc.  Non-urgent messages can be sent to your provider as well.   To learn more about what you can do with MyChart, go to [email or web address].    Your next appointment:   12 month(s)  The format for your next appointment:   In Person  Provider:   Dr. Claiborne Billings (sleep clinic)         Signed, Shelva Majestic, MD , [facility] [place], American Board of Sleep Medicine  12/05/2021 12:14 PM    [facility] [address] [place], [place], [place]  [address] Phone: [phone]

## 2021-12-03 NOTE — Patient Instructions (Signed)
Follow-Up: At [facility], you and your health needs are our priority.  As part of our continuing mission to provide you with exceptional heart care, we have created designated Provider Care Teams.  These Care Teams include your primary Cardiologist (physician) and Advanced Practice Providers (APPs -  Physician Assistants and Nurse Practitioners) who all work together to provide you with the care you need, when you need it.  We recommend signing up for the patient portal called "MyChart".  Sign up information is provided on this After Visit Summary.  MyChart is used to connect with patients for Virtual Visits (Telemedicine).  Patients are able to view lab/test results, encounter notes, upcoming appointments, etc.  Non-urgent messages can be sent to your provider as well.   To learn more about what you can do with MyChart, go to [email or web address].    Your next appointment:   12 month(s)  The format for your next appointment:   In Person  Provider:   Dr. Claiborne Billings (sleep clinic)

## 2021-12-05 ENCOUNTER — Encounter: Payer: Self-pay | Admitting: Cardiovascular Disease

## 2021-12-17 ENCOUNTER — Encounter (INDEPENDENT_AMBULATORY_CARE_PROVIDER_SITE_OTHER): Payer: Self-pay

## 2021-12-17 ENCOUNTER — Ambulatory Visit (INDEPENDENT_AMBULATORY_CARE_PROVIDER_SITE_OTHER): Payer: Medicare Other | Admitting: Gastroenterology

## 2021-12-17 ENCOUNTER — Encounter (INDEPENDENT_AMBULATORY_CARE_PROVIDER_SITE_OTHER): Payer: Self-pay | Admitting: Gastroenterology

## 2021-12-17 ENCOUNTER — Other Ambulatory Visit (HOSPITAL_COMMUNITY)
Admission: RE | Admit: 2021-12-17 | Discharge: 2021-12-17 | Disposition: A | Discharge status: Home / Self Care | Payer: Medicare Other | Source: Ambulatory Visit | Attending: Dermatology | Admitting: Dermatology

## 2021-12-17 ENCOUNTER — Telehealth (INDEPENDENT_AMBULATORY_CARE_PROVIDER_SITE_OTHER): Payer: Self-pay

## 2021-12-17 ENCOUNTER — Other Ambulatory Visit (INDEPENDENT_AMBULATORY_CARE_PROVIDER_SITE_OTHER): Payer: Self-pay

## 2021-12-17 VITALS — BP 102/55 | HR 59 | Temp 97.8°F | Ht 71.0 in | Wt 193.0 lb

## 2021-12-17 DIAGNOSIS — K219 Gastro-esophageal reflux disease without esophagitis: Secondary | ICD-10-CM | POA: Diagnosis not present

## 2021-12-17 DIAGNOSIS — K529 Noninfective gastroenteritis and colitis, unspecified: Secondary | ICD-10-CM | POA: Diagnosis not present

## 2021-12-17 DIAGNOSIS — Z79899 Other long term (current) drug therapy: Secondary | ICD-10-CM | POA: Diagnosis not present

## 2021-12-17 DIAGNOSIS — R1310 Dysphagia, unspecified: Secondary | ICD-10-CM

## 2021-12-17 DIAGNOSIS — L400 Psoriasis vulgaris: Secondary | ICD-10-CM | POA: Diagnosis not present

## 2021-12-17 LAB — CBC WITH DIFFERENTIAL/PLATELET
Abs Immature Granulocytes: 0.01 10*3/uL (ref 0.00–0.07)
Basophils Absolute: 0 10*3/uL (ref 0.0–0.1)
Basophils Relative: 0 %
Eosinophils Absolute: 0.1 10*3/uL (ref 0.0–0.5)
Eosinophils Relative: 2 %
HCT: 40.8 % (ref 39.0–52.0)
Hemoglobin: 13.6 g/dL (ref 13.0–17.0)
Immature Granulocytes: 0 %
Lymphocytes Relative: 16 %
Lymphs Abs: 0.8 10*3/uL (ref 0.7–4.0)
MCH: 32.5 pg (ref 26.0–34.0)
MCHC: 33.3 g/dL (ref 30.0–36.0)
MCV: 97.6 fL (ref 80.0–100.0)
Monocytes Absolute: 0.6 10*3/uL (ref 0.1–1.0)
Monocytes Relative: 12 %
Neutro Abs: 3.5 10*3/uL (ref 1.7–7.7)
Neutrophils Relative %: 70 %
Platelets: 223 10*3/uL (ref 150–400)
RBC: 4.18 MIL/uL — ABNORMAL LOW (ref 4.22–5.81)
RDW: 13.7 % (ref 11.5–15.5)
WBC: 5.1 10*3/uL (ref 4.0–10.5)
nRBC: 0 % (ref 0.0–0.2)

## 2021-12-17 LAB — LIPID PANEL
Cholesterol: 102 mg/dL (ref 0–200)
HDL: 47 mg/dL (ref >40)
LDL Cholesterol: 39 mg/dL (ref 0–99)
Total CHOL/HDL Ratio: 2.2 RATIO
Triglycerides: 82 mg/dL (ref <150)
VLDL: 16 mg/dL (ref 0–40)

## 2021-12-17 LAB — COMPREHENSIVE METABOLIC PANEL
ALT: 19 U/L (ref 0–44)
AST: 25 U/L (ref 15–41)
Albumin: 4.1 g/dL (ref 3.5–5.0)
Alkaline Phosphatase: 61 U/L (ref 38–126)
Anion gap: 9 (ref 5–15)
BUN: 13 mg/dL (ref 8–23)
CO2: 30 mmol/L (ref 22–32)
Calcium: 8.9 mg/dL (ref 8.9–10.3)
Chloride: 100 mmol/L (ref 98–111)
Creatinine, Ser: 0.62 mg/dL (ref 0.61–1.24)
GFR, Estimated: >60 mL/min (ref >60)
Glucose, Bld: 90 mg/dL (ref 70–99)
Potassium: 4.2 mmol/L (ref 3.5–5.1)
Sodium: 139 mmol/L (ref 135–145)
Total Bilirubin: 1 mg/dL (ref 0.3–1.2)
Total Protein: 7.1 g/dL (ref 6.5–8.1)

## 2021-12-17 MED ORDER — PEG 3350-KCL-NA BICARB-NACL 420 G PO SOLR: 4000.0000 mL | ORAL | 0 refills | Status: DC

## 2021-12-17 MED ORDER — COLESTIPOL HCL 1 G PO TABS: 4.0000 g | ORAL_TABLET | Freq: Every day | ORAL | 1 refills | Status: DC

## 2021-12-17 NOTE — Telephone Encounter (Signed)
[name], CMA  ?

## 2021-12-17 NOTE — Progress Notes (Signed)
Maylon Peppers, M.D. Gastroenterology & Hepatology [facility] [address] [place], [place] [address]  Primary Care Physician: Claretta Fraise, MD [place] [address]  I will communicate my assessment and recommendations to the referring MD via EMR.  Problems: Choledocholithiasis Chronic diarrhea  History of Present Illness: [name] is a 86 y.o. male with past medical history of atrial fibrillation, BPH, diabetes, stroke, OSA, hyperlipidemia, GERD, hypertension, who presents for follow up of chronic diarrhea.  The patient was last seen on 08/17/2021. At that time, the patient was scheduled for esophagogastroduodenospy for evaluation of dysphagia, but he did not proceed with that endoscopy.  Celiac serology, fecal fat, TSH were checked which were within normal limits, however the pancreatic elastase was decreased at 108.  Patient was given a prescription for possible EPI with Creon, however this was not covered and I sent Zenpep.  Unfortunately, he developed a rash a few months after starting Zenpep and he was advised to stop taking the medication.  He was started on colestipol 2 g every day.   EGD was ordered in the past but for unclear reasons it was never scheduled.  Patient reports he did not notice any difference in his diarrhea severity after he took Zenpep or colestipol. Currently taking colestipol 2 g qday. He reports that he is having 3-5 Bms per day, sometimes has to go urgently. Sometimes he has episodes of diarrhea when sleeping and has to go urgently. He has watery to very soft Bms. Has not had any blood in the stool.  Notably, the rash he was concerned about was actually psoriasis, which has been managed recently with methotrexate.  Patient reports his son has "some sort of colitis".  Patient is currently taking omeprazole 40 mg every day for GERD. He denies any heartburn or odynophagia,but also reports  having some dysphagia when swallowing pills and sometimes with solid food.  The patient denies having any nausea, vomiting, fever, chills, hematochezia, melena, hematemesis, abdominal distention,  jaundice, pruritus. Has lost weight on purpose with weight watchers program.  Last EGD: 05/21/2021 - Normal hypopharynx. - Normal proximal esophagus, mid esophagus and distal esophagus. - Esophageal mucosal changes suspicious for short-segment Barrett's esophagus. - Z-line irregular, 40 cm from the incisors. - No endoscopic esophageal abnormality to explain patient's dysphagia. Esophagus dilated. - Normal stomach. - Normal duodenal bulb and second portion of the duodenum.   Recommended repeat EGD in 3 months.  Last Colonoscopy: possibly 10 years ago.  Past Medical History: Past Medical History:  Diagnosis Date   Arthritis    knees, shoulder,  hips (10/24/2017)   Atrial fibrillation (HCC)    on eliquis   BPH (benign prostatic hyperplasia)    Cataracts, bilateral    removed by surgery   Coronary artery disease excluded    Diabetes mellitus without complication ([place])    type 2   Diverticulosis    Endocarditis, valve unspecified, unspecified cause    GERD (gastroesophageal reflux disease)    H/O mitral valve repair    Postoperative ring with postoperative atrial fibrillation   Hearing loss    both ears - does not use hearing aids   Heart murmur    hx   Hiatal hernia 06/13/2002   High cholesterol    History of kidney stones    passed spontaneously x2    OSA on CPAP    uses cpap nightly   Stroke [facility])    Unspecified essential hypertension  Weakness of both arms 07/19/2012   Wears dentures    upper and lower partial    Past Surgical History: Past Surgical History:  Procedure Laterality Date   CARDIAC CATHETERIZATION  02/14/2007   x 2   CARDIOVERSION N/A 04/14/2016   Procedure: CARDIOVERSION;  Surgeon: Minus Breeding, MD;  Location: [facility] ENDOSCOPY;  Service: Cardiovascular;   Laterality: N/A;   CATARACT EXTRACTION W/PHACO Right 05/12/2015   Procedure: CATARACT EXTRACTION PHACO AND INTRAOCULAR LENS PLACEMENT RIGHT EYE CDE=7.75;  Surgeon: Tonny Branch, MD;  Location: [facility] ORS;  Service: Ophthalmology;  Laterality: Right;   CATARACT EXTRACTION W/PHACO Left 06/12/2015   Procedure: CATARACT EXTRACTION PHACO AND INTRAOCULAR LENS PLACEMENT (IOC);  Surgeon: Tonny Branch, MD;  Location: [facility];  Service: Ophthalmology;  Laterality: Left;  CDE:  13.17   COLONOSCOPY     ERCP N/A 05/21/2021   Procedure: ENDOSCOPIC RETROGRADE CHOLANGIOPANCREATOGRAPHY (ERCP);  Surgeon: Rogene Houston, MD;  Location: [facility];  Service: Gastroenterology;  Laterality: N/A;   ERCP N/A 05/21/2021   Procedure: ENDOSCOPIC RETROGRADE CHOLANGIOPANCREATOGRAPHY (ERCP);  Surgeon: Rogene Houston, MD;  Location: [facility];  Service: Gastroenterology;  Laterality: N/A;   ESOPHAGEAL DILATION N/A 05/21/2021   Procedure: ESOPHAGEAL DILATION;  Surgeon: Rogene Houston, MD;  Location: [facility];  Service: Gastroenterology;  Laterality: N/A;   ESOPHAGOGASTRODUODENOSCOPY N/A 05/21/2021   Procedure: ESOPHAGOGASTRODUODENOSCOPY (EGD);  Surgeon: Rogene Houston, MD;  Location: [facility];  Service: Gastroenterology;  Laterality: N/A;   EYE SURGERY     Bilateral cataracts   INJECTION KNEE Right 02/20/2016   Procedure: KNEE INJECTION;  Surgeon: [name], MD;  Location: [place];  Service: Orthopedics;  Laterality: Right;   JOINT REPLACEMENT     KNEE ARTHROSCOPY Left    LAPAROSCOPIC CHOLECYSTECTOMY     MALONEY DILATION N/A 05/21/2021   Procedure: MALONEY DILATION;  Surgeon: Rogene Houston, MD;  Location: [facility];  Service: Gastroenterology;  Laterality: N/A;   MITRAL VALVE REPAIR  ~ 2003   MULTIPLE TOOTH EXTRACTIONS     REMOVAL OF STONES  05/21/2021   Procedure: REMOVAL OF STONES;  Surgeon: Rogene Houston, MD;  Location: [facility];  Service: Gastroenterology;;   SHOULDER ARTHROSCOPY WITH ROTATOR CUFF REPAIR AND OPEN BICEPS TENODESIS Right  01/26/2019   Procedure: right shoulder arthroscopy, rotator cuff repair and biceps tenodesis;  Surgeon: [name], MD;  Location: [place];  Service: Orthopedics;  Laterality: Right;   SHOULDER OPEN ROTATOR CUFF REPAIR Left    SPHINCTEROTOMY  05/21/2021   Procedure: SPHINCTEROTOMY;  Surgeon: Rogene Houston, MD;  Location: [facility];  Service: Gastroenterology;;   TOTAL KNEE ARTHROPLASTY Left 02/20/2016   Procedure: LEFT TOTAL KNEE ARTHROPLASTY WITH RIGHT KNEE INJECTION;  Surgeon: [name], MD;  Location: [facility];  Service: Orthopedics;  Laterality: Left;   TOTAL KNEE ARTHROPLASTY Right 07/06/2019   Procedure: RIGHT TOTAL KNEE ARTHROPLASTY;  Surgeon: [name], MD;  Location: [facility];  Service: Orthopedics;  Laterality: Right;   UPPER GI ENDOSCOPY     VASECTOMY      Family History: Family History  Problem Relation Age of Onset   Congestive Heart Failure Father    Heart disease Father    Stroke Brother    Stroke Sister    Diabetes Mellitus II Brother    Diabetes Mellitus II Sister    Lung cancer Brother    Stroke Sister    Stroke Brother    Colon cancer Neg Hx    Esophageal cancer  Neg Hx    Rectal cancer Neg Hx    Stomach cancer Neg Hx     Social History: Social History   Tobacco Use  Smoking Status Former   Packs/day: 3.00   Years: 35.00   Total pack years: 105.00   Types: Cigarettes   Quit date: 02/16/1988   Years since quitting: 33.8   Passive exposure: Past  Smokeless Tobacco Never  Tobacco Comments    Year Quit: 1990    Social History   Substance and Sexual Activity  Alcohol Use No   Social History   Substance and Sexual Activity  Drug Use Never    Allergies: Allergies  Allergen Reactions   Flomax [Tamsulosin Hcl] Other (See Comments)    Makes him "swimmy headed"    Medications: Current Outpatient Medications  Medication Sig Dispense Refill   amLODipine (NORVASC) 5 MG tablet Take 1 tablet (5 mg total) by mouth daily. 90 tablet 3   apixaban  (ELIQUIS) 5 MG TABS tablet Take 1 tablet (5 mg total) by mouth 2 (two) times daily. 180 tablet 1   Calcium Carb-Cholecalciferol (CALCIUM 600 + D PO) Take 1 tablet by mouth daily.     Coenzyme Q10 300 MG CAPS Take 300 mg by mouth daily.     colestipol (COLESTID) 1 g tablet Take 2 tablets (2 g total) by mouth daily. take it 4 hours apart from his other medications 180 tablet 1   lisinopril (ZESTRIL) 20 MG tablet TAKE 1/2 TABLET BY MOUTH EVERYDAY AT BEDTIME 90 tablet 2   meclizine (ANTIVERT) 25 MG tablet Take 25 mg by mouth 3 (three) times daily as needed for dizziness.     metFORMIN (GLUCOPHAGE-XR) 500 MG 24 hr tablet TAKE 1 TABLET BY MOUTH EVERY DAY WITH BREAKFAST 90 tablet 3   methotrexate (RHEUMATREX) 2.5 MG tablet Take 10 mg by mouth once a week.     Multiple Vitamin (MULTIVITAMIN WITH MINERALS) TABS tablet Take 1 tablet by mouth daily.     omeprazole (PRILOSEC) 40 MG capsule Take 1 capsule (40 mg total) by mouth daily. 90 capsule 3   rosuvastatin (CRESTOR) 5 MG tablet Take 1 tablet (5 mg total) by mouth daily. For cholesterol 90 tablet 3   silodosin (RAPAFLO) 4 MG CAPS capsule Take 1 capsule (4 mg total) by mouth daily with breakfast. 90 capsule 3   No current facility-administered medications for this visit.    Review of Systems: GENERAL: negative for malaise, night sweats HEENT: No changes in hearing or vision, no nose bleeds or other nasal problems. NECK: Negative for lumps, goiter, pain and significant neck swelling RESPIRATORY: Negative for cough, wheezing CARDIOVASCULAR: Negative for chest pain, leg swelling, palpitations, orthopnea GI: SEE HPI MUSCULOSKELETAL: Negative for joint pain or swelling, back pain, and muscle pain. SKIN: Negative for lesions, rash PSYCH: Negative for sleep disturbance, mood disorder and recent psychosocial stressors. HEMATOLOGY Negative for prolonged bleeding, bruising easily, and swollen nodes. ENDOCRINE: Negative for cold or heat intolerance, polyuria,  polydipsia and goiter. NEURO: negative for tremor, gait imbalance, syncope and seizures. The remainder of the review of systems is noncontributory.   Physical Exam: BP (!) 102/55 (BP Location: Left Arm, Patient Position: Sitting, Cuff Size: Small)   Pulse (!) 59   Temp 97.8 F (36.6 C) (Temporal)   Ht '5\' 11"'$  (1.803 m)   Wt 193 lb (87.5 kg)   BMI 26.92 kg/m  GENERAL: The patient is AO x3, in no acute distress. HEENT: Head is normocephalic and atraumatic. EOMI  are intact. Mouth is well hydrated and without lesions. NECK: Supple. No masses LUNGS: Clear to auscultation. No presence of rhonchi/wheezing/rales. Adequate chest expansion HEART: RRR, normal s1 and s2. ABDOMEN: Soft, nontender, no guarding, no peritoneal signs, and nondistended. BS +. No masses. EXTREMITIES: Without any cyanosis, clubbing, rash, lesions or edema. NEUROLOGIC: AOx3, no focal motor deficit. SKIN: no jaundice, has diffuse psoriasis in all the body  Imaging/Labs: as above  I personally reviewed and interpreted the available labs, imaging and endoscopic files.  Impression and Plan: [name] is a 86 y.o. male with past medical history of atrial fibrillation, BPH, diabetes, stroke, OSA, hyperlipidemia, GERD, hypertension, who presents for follow up of chronic diarrhea. The patient has presented persisting diarrhea despite taking pancreatic enzymes for presumed EPI (decreased elastase but normal fecal fat), which makes this diagnosis less likely. He has not improved with medium dose colestipol. We will proceed with and endoscopic evaluation of the colon to rule out inflammatory/microscopic causes, patient is agreeable with this. For now, colestipol will be increased to 4 g qday.  Regarding his dysphagia and GERD with possible BE, this would best be evaluated with an EGD. This will be scheduled at the same time of colonoscopy. Will continue omeprazole 40 mg qday for GERD management.  -Schedule EGD with  esophageal biopsies and dilation, and colonoscopy with random colonic biopsies -Increase colestipol to 4 g every day -Continue omeprazole 40 mg qday  All questions were answered.      Maylon Peppers, MD Gastroenterology and Hepatology [facility]

## 2021-12-17 NOTE — Patient Instructions (Signed)
Schedule EGD with esophageal biopsies and dilation, and colonoscopy with random colonic biopsies Increase colestipol to 4 g every day Continue omeprazole 40 mg qday

## 2021-12-18 ENCOUNTER — Other Ambulatory Visit (INDEPENDENT_AMBULATORY_CARE_PROVIDER_SITE_OTHER): Payer: Self-pay

## 2021-12-18 ENCOUNTER — Telehealth: Payer: Self-pay | Admitting: *Deleted

## 2021-12-18 ENCOUNTER — Encounter (INDEPENDENT_AMBULATORY_CARE_PROVIDER_SITE_OTHER): Payer: Self-pay

## 2021-12-18 NOTE — Telephone Encounter (Signed)
   Pre-operative Risk Assessment    Patient Name: [name]  DOB: [birth year] MRN: [id number]      Request for Surgical Clearance    Procedure:   COLONOSCOPY / UPPER ENDOSCOPY  Date of Surgery:  Clearance 01/21/22                                 Surgeon:  DR. DANIEL CASTANEDA Surgeon's Group or Practice Name:  [facility] Phone number:  [phone] Fax number:  [phone]   Type of Clearance Requested:   - Pharmacy:  Hold Apixaban (Eliquis) X'S 2 DAYS   Type of Anesthesia:  MAC   Additional requests/questions:    Signed, Jeanann Lewandowsky   12/18/2021, 8:11 AM

## 2021-12-21 NOTE — Telephone Encounter (Signed)
Patient with diagnosis of atrial fibrillation on Eliquis for anticoagulation.    Procedure: colonoscopy/upper endoscopy Date of procedure: 01/21/22   CHA2DS2-VASc Score = 6   This indicates a 9.7% annual risk of stroke. The patient's score is based upon: CHF History: 0 HTN History: 1 Diabetes History: 1 Stroke History: 2 Vascular Disease History: 0 Age Score: 2 Gender Score: 0   Per Dr. Rosezella [place] note 11/2021:  In 2019 he had some double vision and was told he could have had a "small stroke."   CrCl 110 Platelet count 223  Per office protocol, patient can hold Eliquis for 2 days prior to procedure.   Patient will not need bridging with Lovenox (enoxaparin) around procedure.  **This guidance is not considered finalized until pre-operative APP has relayed final recommendations.**

## 2021-12-21 NOTE — Telephone Encounter (Signed)
     Primary Cardiologist: Minus Breeding, MD  Chart reviewed as part of pre-operative protocol coverage. Given past medical history and time since last visit, based on ACC/AHA guidelines, EMMIT ORILEY would be at acceptable risk for the planned procedure without further cardiovascular testing.   Patient with diagnosis of atrial fibrillation on Eliquis for anticoagulation.     Procedure: colonoscopy/upper endoscopy Date of procedure: 01/21/22     CHA2DS2-VASc Score = 6   This indicates a 9.7% annual risk of stroke. The patient's score is based upon: CHF History: 0 HTN History: 1 Diabetes History: 1 Stroke History: 2 Vascular Disease History: 0 Age Score: 2 Gender Score: 0   Per Dr. Rosezella [place] note 11/2021:  In 2019 he had some double vision and was told he could have had a "small stroke."    CrCl 110 Platelet count 223   Per office protocol, patient can hold Eliquis for 2 days prior to procedure.   Patient will not need bridging with Lovenox (enoxaparin) around procedure.    I will route this recommendation to the requesting party via Epic fax function and remove from pre-op pool.  Please call with questions.  Jossie Ng. Zakaree Mcclenahan NP-C     12/21/2021, 3:30 PM [place] [address] Office [phone] Fax [phone]

## 2021-12-24 DIAGNOSIS — L400 Psoriasis vulgaris: Secondary | ICD-10-CM | POA: Diagnosis not present

## 2022-01-15 ENCOUNTER — Other Ambulatory Visit: Payer: Self-pay

## 2022-01-15 ENCOUNTER — Encounter (HOSPITAL_COMMUNITY)
Admission: RE | Admit: 2022-01-15 | Discharge: 2022-01-15 | Disposition: A | Discharge status: Home / Self Care | Payer: Medicare Other | Source: Ambulatory Visit | Attending: Gastroenterology | Admitting: Gastroenterology

## 2022-01-15 ENCOUNTER — Encounter (HOSPITAL_COMMUNITY): Payer: Self-pay

## 2022-01-20 MED ORDER — DEXAMETHASONE SODIUM PHOSPHATE 4 MG/ML IJ SOLN
INTRAMUSCULAR | Status: AC
  Filled 2022-01-20: qty 1

## 2022-01-21 ENCOUNTER — Encounter (INDEPENDENT_AMBULATORY_CARE_PROVIDER_SITE_OTHER): Payer: Self-pay | Admitting: *Deleted

## 2022-01-21 ENCOUNTER — Encounter (HOSPITAL_COMMUNITY): Payer: Self-pay | Admitting: Gastroenterology

## 2022-01-21 ENCOUNTER — Ambulatory Visit (HOSPITAL_COMMUNITY)
Admission: RE | Admit: 2022-01-21 | Discharge: 2022-01-21 | Disposition: A | Discharge status: Home / Self Care | Payer: Medicare Other | Attending: Gastroenterology | Admitting: Gastroenterology

## 2022-01-21 ENCOUNTER — Encounter (HOSPITAL_COMMUNITY)
Admission: RE | Disposition: A | Discharge status: Home / Self Care | Payer: Self-pay | Source: Home / Self Care | Attending: Gastroenterology

## 2022-01-21 ENCOUNTER — Ambulatory Visit (HOSPITAL_COMMUNITY): Payer: Medicare Other | Admitting: Anesthesiology

## 2022-01-21 ENCOUNTER — Other Ambulatory Visit: Payer: Self-pay

## 2022-01-21 ENCOUNTER — Ambulatory Visit (HOSPITAL_BASED_OUTPATIENT_CLINIC_OR_DEPARTMENT_OTHER): Payer: Medicare Other | Admitting: Anesthesiology

## 2022-01-21 DIAGNOSIS — K2289 Other specified disease of esophagus: Secondary | ICD-10-CM | POA: Diagnosis not present

## 2022-01-21 DIAGNOSIS — R1310 Dysphagia, unspecified: Secondary | ICD-10-CM | POA: Diagnosis not present

## 2022-01-21 DIAGNOSIS — E119 Type 2 diabetes mellitus without complications: Secondary | ICD-10-CM | POA: Diagnosis not present

## 2022-01-21 DIAGNOSIS — K449 Diaphragmatic hernia without obstruction or gangrene: Secondary | ICD-10-CM

## 2022-01-21 DIAGNOSIS — Z7984 Long term (current) use of oral hypoglycemic drugs: Secondary | ICD-10-CM | POA: Diagnosis not present

## 2022-01-21 DIAGNOSIS — Z79899 Other long term (current) drug therapy: Secondary | ICD-10-CM | POA: Diagnosis not present

## 2022-01-21 DIAGNOSIS — K5730 Diverticulosis of large intestine without perforation or abscess without bleeding: Secondary | ICD-10-CM

## 2022-01-21 DIAGNOSIS — I10 Essential (primary) hypertension: Secondary | ICD-10-CM | POA: Diagnosis not present

## 2022-01-21 DIAGNOSIS — K219 Gastro-esophageal reflux disease without esophagitis: Secondary | ICD-10-CM | POA: Diagnosis not present

## 2022-01-21 DIAGNOSIS — M1990 Unspecified osteoarthritis, unspecified site: Secondary | ICD-10-CM | POA: Insufficient documentation

## 2022-01-21 DIAGNOSIS — I2510 Atherosclerotic heart disease of native coronary artery without angina pectoris: Secondary | ICD-10-CM | POA: Insufficient documentation

## 2022-01-21 DIAGNOSIS — K529 Noninfective gastroenteritis and colitis, unspecified: Secondary | ICD-10-CM

## 2022-01-21 DIAGNOSIS — G4730 Sleep apnea, unspecified: Secondary | ICD-10-CM | POA: Insufficient documentation

## 2022-01-21 DIAGNOSIS — I080 Rheumatic disorders of both mitral and aortic valves: Secondary | ICD-10-CM | POA: Insufficient documentation

## 2022-01-21 DIAGNOSIS — K5520 Angiodysplasia of colon without hemorrhage: Secondary | ICD-10-CM | POA: Diagnosis not present

## 2022-01-21 DIAGNOSIS — I4891 Unspecified atrial fibrillation: Secondary | ICD-10-CM | POA: Diagnosis not present

## 2022-01-21 DIAGNOSIS — K3189 Other diseases of stomach and duodenum: Secondary | ICD-10-CM | POA: Diagnosis not present

## 2022-01-21 DIAGNOSIS — Z87891 Personal history of nicotine dependence: Secondary | ICD-10-CM | POA: Insufficient documentation

## 2022-01-21 HISTORY — PX: COLONOSCOPY WITH PROPOFOL: SHX5780

## 2022-01-21 HISTORY — PX: BIOPSY: SHX5522

## 2022-01-21 HISTORY — PX: ESOPHAGOGASTRODUODENOSCOPY (EGD) WITH PROPOFOL: SHX5813

## 2022-01-21 HISTORY — PX: ESOPHAGEAL DILATION: SHX303

## 2022-01-21 LAB — GLUCOSE, CAPILLARY: Glucose-Capillary: 80 mg/dL (ref 70–99)

## 2022-01-21 LAB — HM COLONOSCOPY

## 2022-01-21 SURGERY — COLONOSCOPY WITH PROPOFOL
Anesthesia: General

## 2022-01-21 MED ORDER — PROPOFOL 500 MG/50ML IV EMUL
INTRAVENOUS | Status: DC | PRN
  Administered 2022-01-21: 150 ug/kg/min via INTRAVENOUS

## 2022-01-21 MED ORDER — LACTATED RINGERS IV SOLN
INTRAVENOUS | Status: DC
  Administered 2022-01-21: 1000 mL via INTRAVENOUS

## 2022-01-21 MED ORDER — PROPOFOL 10 MG/ML IV BOLUS
INTRAVENOUS | Status: DC | PRN
  Administered 2022-01-21: 20 mg via INTRAVENOUS
  Administered 2022-01-21: 50 mg via INTRAVENOUS

## 2022-01-21 MED ORDER — PHENYLEPHRINE 80 MCG/ML (10ML) SYRINGE FOR IV PUSH (FOR BLOOD PRESSURE SUPPORT)
PREFILLED_SYRINGE | INTRAVENOUS | Status: DC | PRN
  Administered 2022-01-21 (×2): 80 ug via INTRAVENOUS

## 2022-01-21 MED ORDER — STERILE WATER FOR IRRIGATION IR SOLN
Status: DC | PRN
  Administered 2022-01-21: .6 mL

## 2022-01-21 MED ORDER — LIDOCAINE HCL (CARDIAC) PF 100 MG/5ML IV SOSY
PREFILLED_SYRINGE | INTRAVENOUS | Status: DC | PRN
  Administered 2022-01-21: 100 mg via INTRAVENOUS

## 2022-01-21 NOTE — Transfer of Care (Signed)
Immediate Anesthesia Transfer of Care Note  Patient: [name]  Procedure(s) Performed: COLONOSCOPY WITH PROPOFOL ESOPHAGOGASTRODUODENOSCOPY (EGD) WITH PROPOFOL ESOPHAGEAL DILATION BIOPSY  Patient Location: PACU  Anesthesia Type:General  Level of Consciousness: drowsy  Airway & Oxygen Therapy: Patient Spontanous Breathing  Post-op Assessment: Report given to RN and Post -op Vital signs reviewed and stable  Post vital signs: Reviewed and stable  Last Vitals:  Vitals Value Taken Time  BP 90/42 01/21/22 1312  Temp    Pulse 72 01/21/22 1312  Resp 28 01/21/22 1312  SpO2 100 % 01/21/22 1312    Last Pain:  Vitals:   01/21/22 1312  TempSrc: Oral  PainSc: 0-No pain      Patients Stated Pain Goal: 6 (58/94/83 4758)  Complications: No notable events documented.

## 2022-01-21 NOTE — Anesthesia Preprocedure Evaluation (Signed)
Anesthesia Evaluation  Patient identified by MRN, date of birth, ID band Patient awake    Reviewed: Allergy & Precautions, NPO status , Patient's Chart, lab work & pertinent test results  Airway Mallampati: II  TM Distance: >3 FB Neck ROM: Full    Dental  (+) Dental Advisory Given, Missing   Pulmonary sleep apnea and Continuous Positive Airway Pressure Ventilation , former smoker   Pulmonary exam normal breath sounds clear to auscultation       Cardiovascular Exercise Tolerance: Good hypertension, Pt. on medications + CAD  + dysrhythmias Atrial Fibrillation + Valvular Problems/Murmurs (mitral valve repair)  Rhythm:Irregular Rate:Bradycardia  1. Left ventricular ejection fraction, by estimation, is 55 to 60%. The  left ventricle has normal function. The left ventricle has no regional  wall motion abnormalities. There is mild left ventricular hypertrophy.  Left ventricular diastolic function  could not be evaluated. Elevated left atrial pressure.  2. Right ventricular systolic function is normal. The right ventricular  size is normal. The estimated right ventricular systolic pressure is 30.0  mmHg.  3. Left atrial size was moderately dilated.  4. The mitral valve is abnormal. Trivial mitral valve regurgitation.  borderline mitral stenosis. The mean mitral valve gradient is 4.0 mmHg.  Moderate mitral annular calcification.  5. The aortic valve is tricuspid. Aortic valve regurgitation is not  visualized.  6. Aortic dilatation noted. There is borderline dilatation of the aortic  root, measuring 39 mm.  7. The inferior vena cava is dilated in size with <50% respiratory  variability, suggesting right atrial pressure of 15 mmHg.    Neuro/Psych TIA Neuromuscular disease CVA    GI/Hepatic hiatal hernia,GERD  Medicated and Controlled,,  Endo/Other  diabetes, Well Controlled, Type 2, Oral Hypoglycemic Agents    Renal/GU       Musculoskeletal  (+) Arthritis , Osteoarthritis,    Abdominal   Peds  Hematology   Anesthesia Other Findings   Reproductive/Obstetrics                             Anesthesia Physical Anesthesia Plan  ASA: 3  Anesthesia Plan: General   Post-op Pain Management: Minimal or no pain anticipated   Induction: Intravenous  PONV Risk Score and Plan: 3 and Ondansetron and Dexamethasone  Airway Management Planned: Oral ETT  Additional Equipment:   Intra-op Plan:   Post-operative Plan:   Informed Consent: I have reviewed the patients History and Physical, chart, labs and discussed the procedure including the risks, benefits and alternatives for the proposed anesthesia with the patient or authorized representative who has indicated his/her understanding and acceptance.     Dental advisory given  Plan Discussed with: Surgeon and CRNA  Anesthesia Plan Comments:         Anesthesia Quick Evaluation

## 2022-01-21 NOTE — Discharge Instructions (Signed)
You are being discharged to home.  Resume your previous diet.  We are waiting for your pathology results.  If persistent dysphagia, will proceed with modified barium swallow.  Your physician has indicated that a repeat colonoscopy is not recommended due to your current age (81 years or older) for screening purposes.

## 2022-01-21 NOTE — H&P (Signed)
[name] is an 86 y.o. male.   Chief Complaint: dysphagia and diarrhea HPI: [name] is a 86 y.o. male with past medical history of atrial fibrillation, BPH, diabetes, stroke, OSA, hyperlipidemia, GERD, hypertension, who presents for dysphagia and  chronic diarrhea.   Patient has persisted with diarrhea daily. Still presenting dysphagia to solids.  Past Medical History:  Diagnosis Date   Arthritis    knees, shoulder,  hips (10/24/2017)   Atrial fibrillation (HCC)    on eliquis   BPH (benign prostatic hyperplasia)    Cataracts, bilateral    removed by surgery   Coronary artery disease excluded    Diabetes mellitus without complication ([place])    type 2   Diverticulosis    Endocarditis, valve unspecified, unspecified cause    GERD (gastroesophageal reflux disease)    H/O mitral valve repair    Postoperative ring with postoperative atrial fibrillation   Hearing loss    both ears - does not use hearing aids   Heart murmur    hx   Hiatal hernia 06/13/2002   High cholesterol    History of kidney stones    passed spontaneously x2    OSA on CPAP    uses cpap nightly   Stroke ([place])    Unspecified essential hypertension    Weakness of both arms 07/19/2012   Wears dentures    upper and lower partial    Past Surgical History:  Procedure Laterality Date   CARDIAC CATHETERIZATION  02/14/2007   x 2   CARDIOVERSION N/A 04/14/2016   Procedure: CARDIOVERSION;  Surgeon: Minus Breeding, MD;  Location: [place];  Service: Cardiovascular;  Laterality: N/A;   CATARACT EXTRACTION W/PHACO Right 05/12/2015   Procedure: CATARACT EXTRACTION PHACO AND INTRAOCULAR LENS PLACEMENT RIGHT EYE CDE=7.75;  Surgeon: Tonny Branch, MD;  Location: [facility];  Service: Ophthalmology;  Laterality: Right;   CATARACT EXTRACTION W/PHACO Left 06/12/2015   Procedure: CATARACT EXTRACTION PHACO AND INTRAOCULAR LENS PLACEMENT (IOC);  Surgeon: Tonny Branch, MD;  Location: [facility];  Service: Ophthalmology;   Laterality: Left;  CDE:  13.17   COLONOSCOPY     ERCP N/A 05/21/2021   Procedure: ENDOSCOPIC RETROGRADE CHOLANGIOPANCREATOGRAPHY (ERCP);  Surgeon: Rogene Houston, MD;  Location: [facility];  Service: Gastroenterology;  Laterality: N/A;   ERCP N/A 05/21/2021   Procedure: ENDOSCOPIC RETROGRADE CHOLANGIOPANCREATOGRAPHY (ERCP);  Surgeon: Rogene Houston, MD;  Location: [facility];  Service: Gastroenterology;  Laterality: N/A;   ESOPHAGEAL DILATION N/A 05/21/2021   Procedure: ESOPHAGEAL DILATION;  Surgeon: Rogene Houston, MD;  Location: [facility];  Service: Gastroenterology;  Laterality: N/A;   ESOPHAGOGASTRODUODENOSCOPY N/A 05/21/2021   Procedure: ESOPHAGOGASTRODUODENOSCOPY (EGD);  Surgeon: Rogene Houston, MD;  Location: [facility];  Service: Gastroenterology;  Laterality: N/A;   EYE SURGERY     Bilateral cataracts   INJECTION KNEE Right 02/20/2016   Procedure: KNEE INJECTION;  Surgeon: [name], MD;  Location: [place];  Service: Orthopedics;  Laterality: Right;   JOINT REPLACEMENT     KNEE ARTHROSCOPY Left    LAPAROSCOPIC CHOLECYSTECTOMY     MALONEY DILATION N/A 05/21/2021   Procedure: MALONEY DILATION;  Surgeon: Rogene Houston, MD;  Location: [facility];  Service: Gastroenterology;  Laterality: N/A;   MITRAL VALVE REPAIR  ~ 2003   MULTIPLE TOOTH EXTRACTIONS     REMOVAL OF STONES  05/21/2021   Procedure: REMOVAL OF STONES;  Surgeon: Rogene Houston, MD;  Location: [facility];  Service: Gastroenterology;;   [place] Masker  ARTHROSCOPY WITH ROTATOR CUFF REPAIR AND OPEN BICEPS TENODESIS Right 01/26/2019   Procedure: right shoulder arthroscopy, rotator cuff repair and biceps tenodesis;  Surgeon: [name], MD;  Location: [place];  Service: Orthopedics;  Laterality: Right;   SHOULDER OPEN ROTATOR CUFF REPAIR Left    SPHINCTEROTOMY  05/21/2021   Procedure: SPHINCTEROTOMY;  Surgeon: Rogene Houston, MD;  Location: [facility];  Service: Gastroenterology;;   TOTAL KNEE ARTHROPLASTY Left 02/20/2016   Procedure: LEFT TOTAL KNEE  ARTHROPLASTY WITH RIGHT KNEE INJECTION;  Surgeon: [name], MD;  Location: [place];  Service: Orthopedics;  Laterality: Left;   TOTAL KNEE ARTHROPLASTY Right 07/06/2019   Procedure: RIGHT TOTAL KNEE ARTHROPLASTY;  Surgeon: [name], MD;  Location: [place];  Service: Orthopedics;  Laterality: Right;   UPPER GI ENDOSCOPY     VASECTOMY      Family History  Problem Relation Age of Onset   Congestive Heart Failure Father    Heart disease Father    Stroke Brother    Stroke Sister    Diabetes Mellitus II Brother    Diabetes Mellitus II Sister    Lung cancer Brother    Stroke Sister    Stroke Brother    Colon cancer Neg Hx    Esophageal cancer Neg Hx    Rectal cancer Neg Hx    Stomach cancer Neg Hx    Social History:  reports that he quit smoking about 33 years ago. His smoking use included cigarettes. He has a 105.00 pack-year smoking history. He has been exposed to tobacco smoke. He has never used smokeless tobacco. He reports that he does not drink alcohol and does not use drugs.  Allergies:  Allergies  Allergen Reactions   Flomax [Tamsulosin Hcl] Other (See Comments)    Makes him "swimmy headed"    Medications Prior to Admission  Medication Sig Dispense Refill   amLODipine (NORVASC) 5 MG tablet Take 1 tablet (5 mg total) by mouth daily. 90 tablet 3   apixaban (ELIQUIS) 5 MG TABS tablet Take 1 tablet (5 mg total) by mouth 2 (two) times daily. 180 tablet 1   Calcium Carb-Cholecalciferol (CALCIUM 600 + D PO) Take 1 tablet by mouth daily.     clobetasol cream (TEMOVATE) 7.82 % Apply 1 Application topically 2 (two) times daily as needed (Psoriasis).     Coenzyme Q10 300 MG CAPS Take 300 mg by mouth daily.     colestipol (COLESTID) 1 g tablet Take 4 tablets (4 g total) by mouth daily. take it 4 hours apart from his other medications (Patient taking differently: Take 1 g by mouth daily. take it 4 hours apart from his other medications) 360 tablet 1   fluticasone (CUTIVATE) 0.05 %  cream Apply 1 Application topically 2 (two) times daily as needed (Psoriasis).     folic acid (FOLVITE) 1 MG tablet Take 1 mg by mouth daily.     lisinopril (ZESTRIL) 20 MG tablet TAKE 1/2 TABLET BY MOUTH EVERYDAY AT BEDTIME 90 tablet 2   metFORMIN (GLUCOPHAGE-XR) 500 MG 24 hr tablet TAKE 1 TABLET BY MOUTH EVERY DAY WITH BREAKFAST 90 tablet 3   methotrexate (RHEUMATREX) 2.5 MG tablet Take 15 mg by mouth once a week.     Multiple Vitamin (MULTIVITAMIN WITH MINERALS) TABS tablet Take 1 tablet by mouth daily.     omeprazole (PRILOSEC) 40 MG capsule Take 1 capsule (40 mg total) by mouth daily. (Patient taking differently: Take 40 mg by mouth at  bedtime.) 90 capsule 3   polyethylene glycol-electrolytes (TRILYTE) 420 g solution Take 4,000 mLs by mouth as directed. 4000 mL 0   rosuvastatin (CRESTOR) 5 MG tablet Take 1 tablet (5 mg total) by mouth daily. For cholesterol 90 tablet 3   silodosin (RAPAFLO) 4 MG CAPS capsule Take 1 capsule (4 mg total) by mouth daily with breakfast. 90 capsule 3   triamcinolone cream (KENALOG) 0.1 % Apply 1 Application topically daily as needed (Psoriasis).      Results for orders placed or performed during the hospital encounter of 01/21/22 (from the past 48 hour(s))  Glucose, capillary     Status: None   Collection Time: 01/21/22 11:50 AM  Result Value Ref Range   Glucose-Capillary 80 70 - 99 mg/dL    Comment: Glucose reference range applies only to samples taken after fasting for at least 8 hours.   No results found.  Review of Systems  All other systems reviewed and are negative.   Blood pressure (!) 129/56, pulse (!) 58, temperature 97.8 F (36.6 C), temperature source Oral, resp. rate 17, height '5\' 11"'$  (1.803 m), weight 83.9 kg, SpO2 100 %. Physical Exam  GENERAL: The patient is AO x3, in no acute distress. HEENT: Head is normocephalic and atraumatic. EOMI are intact. Mouth is well hydrated and without lesions. NECK: Supple. No masses LUNGS: Clear to  auscultation. No presence of rhonchi/wheezing/rales. Adequate chest expansion HEART: RRR, normal s1 and s2. ABDOMEN: Soft, nontender, no guarding, no peritoneal signs, and nondistended. BS +. No masses. EXTREMITIES: Without any cyanosis, clubbing, rash, lesions or edema. NEUROLOGIC: AOx3, no focal motor deficit. SKIN: no jaundice, no rashes  Assessment/Plan  [name] is a 86 y.o. male with past medical history of atrial fibrillation, BPH, diabetes, stroke, OSA, hyperlipidemia, GERD, hypertension, who presents for dysphagia and  chronic diarrhea.  Will proceed with EGD and colonoscopy.  Harvel Quale, MD 01/21/2022, 12:28 PM

## 2022-01-21 NOTE — Op Note (Signed)
[facility] Patient Name: [name] Procedure Date: 01/21/2022 12:09 PM MRN: [id number] Date of Birth: 11-Dec-1935 Attending MD: Maylon Peppers , , [id number] CSN: [id number] Age: 86 Admit Type: Outpatient Procedure:                Colonoscopy Indications:              Chronic diarrhea Providers:                Maylon Peppers, Janeece Riggers, RN, Everardo Pacific Referring MD:              Medicines:                Monitored Anesthesia Care Complications:            No immediate complications. Estimated Blood Loss:     Estimated blood loss: none. Procedure:                Pre-Anesthesia Assessment:                           - Prior to the procedure, a History and Physical                            was performed, and patient medications, allergies                            and sensitivities were reviewed. The patient's                            tolerance of previous anesthesia was reviewed.                           - The risks and benefits of the procedure and the                            sedation options and risks were discussed with the                            patient. All questions were answered and informed                            consent was obtained.                           - ASA Grade Assessment: III - A patient with severe                            systemic disease.                           After obtaining informed consent, the colonoscope                            was passed under direct vision. Throughout the                            procedure, the patient's blood pressure, pulse, and  oxygen saturations were monitored continuously. The                            [id number]-HQ190L ([id number]) scope was introduced through                            the anus and advanced to the the terminal ileum.                            The colonoscopy was performed without difficulty.                            The patient tolerated the  procedure well. The                            quality of the bowel preparation was good. Scope In: 12:51:24 PM Scope Out: 1:06:57 PM Scope Withdrawal Time: 0 hours 10 minutes 46 seconds  Total Procedure Duration: 0 hours 15 minutes 33 seconds  Findings:      The perianal and digital rectal examinations were normal.      The terminal ileum appeared normal.      A single small angiodysplastic lesion without bleeding was found in the       cecum.      The rest of the colon appeared normal. Biopsies for histology were taken       with a cold forceps from the right colon and left colon for evaluation       of microscopic colitis.      Scattered small-mouthed diverticula were found in the sigmoid colon.      The retroflexed view of the distal rectum and anal verge was normal and       showed no anal or rectal abnormalities. Impression:               - The examined portion of the ileum was normal.                           - A single non-bleeding colonic angiodysplastic                            lesion.                           - The rest of the examined colon is normal.                            Biopsied.                           - Diverticulosis in the sigmoid colon.                           - The distal rectum and anal verge are normal on                            retroflexion view. Moderate Sedation:      Per Anesthesia Care Recommendation:           -  Discharge patient to home (ambulatory).                           - Resume previous diet.                           - Await pathology results.                           - Repeat colonoscopy is not recommended due to                            current age (8 years or older) for screening                            purposes. Procedure Code(s):        --- Professional ---                           [phone], Colonoscopy, flexible; with biopsy, single                            or multiple Diagnosis Code(s):        --- Professional  ---                           K55.20, Angiodysplasia of colon without hemorrhage                           K52.9, Noninfective gastroenteritis and colitis,                            unspecified                           K57.30, Diverticulosis of large intestine without                            perforation or abscess without bleeding CPT copyright 2022 American [employer] Association. All rights reserved. The codes documented in this report are preliminary and upon coder review may  be revised to meet current compliance requirements. Maylon Peppers, MD Maylon Peppers,  01/21/2022 1:14:25 PM This report has been signed electronically. Number of Addenda: 0

## 2022-01-21 NOTE — Op Note (Signed)
[facility] Patient Name: [name] Procedure Date: 01/21/2022 12:03 PM MRN: [id number] Date of Birth: [birth year] Attending MD: Maylon Peppers , , [id number] CSN: [id number] Age: 86 Admit Type: Outpatient Procedure:                Upper GI endoscopy Indications:              Dysphagia Providers:                Maylon Peppers, Janeece Riggers, RN, Everardo Pacific Referring MD:              Medicines:                Monitored Anesthesia Care Complications:            No immediate complications. Estimated Blood Loss:     Estimated blood loss: none. Procedure:                Pre-Anesthesia Assessment:                           - Prior to the procedure, a History and Physical                            was performed, and patient medications, allergies                            and sensitivities were reviewed. The patient's                            tolerance of previous anesthesia was reviewed.                           - The risks and benefits of the procedure and the                            sedation options and risks were discussed with the                            patient. All questions were answered and informed                            consent was obtained.                           - ASA Grade Assessment: III - A patient with severe                            systemic disease.                           After obtaining informed consent, the endoscope was                            passed under direct vision. Throughout the                            procedure, the patient's blood pressure, pulse,  and                            oxygen saturations were monitored continuously. The                            [id number]-[id number] ([id number]) scope was introduced through the                            mouth, and advanced to the second part of duodenum.                            The upper GI endoscopy was accomplished without                            difficulty. The patient  tolerated the procedure                            well. Scope In: 12:34:51 PM Scope Out: 12:45:06 PM Total Procedure Duration: 0 hours 10 minutes 15 seconds  Findings:      No endoscopic abnormality was evident in the esophagus to explain the       patient's complaint of dysphagia. It was decided, however, to proceed       with dilation of the entire esophagus. A guidewire was placed and the       scope was withdrawn. Dilation was performed with a Savary dilator with       no resistance at 18 mm. The dilation site was examined and showed no       change. Biopsies were obtained from the proximal and distal esophagus       with cold forceps for histology.      A 2 cm hiatal hernia was present.      The entire examined stomach was normal.      The examined duodenum was normal. Impression:               - No endoscopic esophageal abnormality to explain                            patient's dysphagia. Esophagus dilated. Dilated.                           - 2 cm hiatal hernia.                           - Normal stomach.                           - Normal examined duodenum.                           - Biopsies were taken with a cold forceps. Moderate Sedation:      Per Anesthesia Care Recommendation:           - Discharge patient to home (ambulatory).                           - Resume previous diet.                           -  Await pathology results.                           -If persistent dysphagia, will proceed with                            modified barium swallow. Procedure Code(s):        --- Professional ---                           [phone], Esophagogastroduodenoscopy, flexible,                            transoral; with insertion of guide wire followed by                            passage of dilator(s) through esophagus over guide                            wire                           43239, 53, Esophagogastroduodenoscopy, flexible,                            transoral; with  biopsy, single or multiple Diagnosis Code(s):        --- Professional ---                           R13.10, Dysphagia, unspecified                           K44.9, Diaphragmatic hernia without obstruction or                            gangrene CPT copyright 2022 American Medical Association. All rights reserved. The codes documented in this report are preliminary and upon coder review may  be revised to meet current compliance requirements. Maylon Peppers, MD Maylon Peppers,  01/21/2022 1:10:41 PM This report has been signed electronically. Number of Addenda: 0

## 2022-01-21 NOTE — Anesthesia Postprocedure Evaluation (Signed)
Anesthesia Post Note  Patient: [name]  Procedure(s) Performed: COLONOSCOPY WITH PROPOFOL ESOPHAGOGASTRODUODENOSCOPY (EGD) WITH PROPOFOL ESOPHAGEAL DILATION BIOPSY  Patient location during evaluation: Phase II Anesthesia Type: General Level of consciousness: awake and alert and oriented Pain management: pain level controlled Vital Signs Assessment: post-procedure vital signs reviewed and stable Respiratory status: spontaneous breathing, nonlabored ventilation and respiratory function stable Cardiovascular status: blood pressure returned to baseline and stable Postop Assessment: no apparent nausea or vomiting Anesthetic complications: no  No notable events documented.   Last Vitals:  Vitals:   01/21/22 1147 01/21/22 1312  BP: (!) 129/56 (!) 90/42  Pulse: (!) 58 72  Resp: 17 (!) 28  Temp: 36.6 C   SpO2: 100% 100%    Last Pain:  Vitals:   01/21/22 1312  TempSrc: Oral  PainSc: 0-No pain                 Dean Wonder C Syreeta Figler

## 2022-01-22 LAB — SURGICAL PATHOLOGY

## 2022-01-27 ENCOUNTER — Encounter (HOSPITAL_COMMUNITY): Payer: Self-pay | Admitting: Gastroenterology

## 2022-02-01 ENCOUNTER — Other Ambulatory Visit: Payer: Self-pay | Admitting: Family Medicine

## 2022-02-12 ENCOUNTER — Other Ambulatory Visit (HOSPITAL_COMMUNITY)
Admission: RE | Admit: 2022-02-12 | Discharge: 2022-02-12 | Disposition: A | Discharge status: Home / Self Care | Payer: Medicare Other | Source: Ambulatory Visit | Attending: Dermatology | Admitting: Dermatology

## 2022-02-12 DIAGNOSIS — Z79899 Other long term (current) drug therapy: Secondary | ICD-10-CM | POA: Insufficient documentation

## 2022-02-12 LAB — CBC WITH DIFFERENTIAL/PLATELET
Abs Immature Granulocytes: 0.01 10*3/uL (ref 0.00–0.07)
Basophils Absolute: 0 10*3/uL (ref 0.0–0.1)
Basophils Relative: 0 %
Eosinophils Absolute: 0.1 10*3/uL (ref 0.0–0.5)
Eosinophils Relative: 3 %
HCT: 42.7 % (ref 39.0–52.0)
Hemoglobin: 14.2 g/dL (ref 13.0–17.0)
Immature Granulocytes: 0 %
Lymphocytes Relative: 15 %
Lymphs Abs: 0.8 10*3/uL (ref 0.7–4.0)
MCH: 32.6 pg (ref 26.0–34.0)
MCHC: 33.3 g/dL (ref 30.0–36.0)
MCV: 98.2 fL (ref 80.0–100.0)
Monocytes Absolute: 0.6 10*3/uL (ref 0.1–1.0)
Monocytes Relative: 11 %
Neutro Abs: 3.9 10*3/uL (ref 1.7–7.7)
Neutrophils Relative %: 71 %
Platelets: 226 10*3/uL (ref 150–400)
RBC: 4.35 MIL/uL (ref 4.22–5.81)
RDW: 13.6 % (ref 11.5–15.5)
WBC: 5.4 10*3/uL (ref 4.0–10.5)
nRBC: 0 % (ref 0.0–0.2)

## 2022-02-12 LAB — COMPREHENSIVE METABOLIC PANEL
ALT: 18 U/L (ref 0–44)
AST: 21 U/L (ref 15–41)
Albumin: 4 g/dL (ref 3.5–5.0)
Alkaline Phosphatase: 65 U/L (ref 38–126)
Anion gap: 5 (ref 5–15)
BUN: 18 mg/dL (ref 8–23)
CO2: 31 mmol/L (ref 22–32)
Calcium: 8.8 mg/dL — ABNORMAL LOW (ref 8.9–10.3)
Chloride: 103 mmol/L (ref 98–111)
Creatinine, Ser: 0.59 mg/dL — ABNORMAL LOW (ref 0.61–1.24)
GFR, Estimated: >60 mL/min (ref >60)
Glucose, Bld: 106 mg/dL — ABNORMAL HIGH (ref 70–99)
Potassium: 4.1 mmol/L (ref 3.5–5.1)
Sodium: 139 mmol/L (ref 135–145)
Total Bilirubin: 1.1 mg/dL (ref 0.3–1.2)
Total Protein: 7.3 g/dL (ref 6.5–8.1)

## 2022-02-16 ENCOUNTER — Ambulatory Visit (INDEPENDENT_AMBULATORY_CARE_PROVIDER_SITE_OTHER): Payer: Medicare Other

## 2022-02-16 ENCOUNTER — Other Ambulatory Visit: Payer: Self-pay

## 2022-02-16 VITALS — Ht 71.0 in | Wt 188.0 lb

## 2022-02-16 DIAGNOSIS — Z0000 Encounter for general adult medical examination without abnormal findings: Secondary | ICD-10-CM

## 2022-02-16 NOTE — Patient Instructions (Signed)
[name] , Thank you for taking time to come for your Medicare Wellness Visit. I appreciate your ongoing commitment to your health goals. Please review the following plan we discussed and let me know if I can assist you in the future.   These are the goals we discussed:  Goals      Exercise 150 min/wk Moderate Activity     02/06/2020 AWV Goal: Exercise for General Health  Patient will verbalize understanding of the benefits of increased physical activity: Exercising regularly is important. It will improve your overall fitness, flexibility, and endurance. Regular exercise also will improve your overall health. It can help you control your weight, reduce stress, and improve your bone density. Over the next year, patient will increase physical activity as tolerated with a goal of at least 150 minutes of moderate physical activity per week.  You can tell that you are exercising at a moderate intensity if your heart starts beating faster and you start breathing faster but can still hold a conversation. Moderate-intensity exercise ideas include: Walking 1 mile (1.6 km) in about 15 minutes Biking Hiking Golfing Dancing Water aerobics Patient will verbalize understanding of everyday activities that increase physical activity by providing examples like the following: Yard work, such as: Sales promotion account executive Gardening Washing windows or floors Patient will be able to explain general safety guidelines for exercising:  Before you start a new exercise program, talk with your health care provider. Do not exercise so much that you hurt yourself, feel dizzy, or get very short of breath. Wear comfortable clothes and wear shoes with good support. Drink plenty of water while you exercise to prevent dehydration or heat stroke. Work out until your breathing and your heartbeat get faster.      Plan meals     Eat healthy   Stay busy  Play golf again       Prevent falls     Weight (lb) < 210 lb (95.3 kg)        This is a list of the screening recommended for you and due dates:  Health Maintenance  Topic Date Due   Eye exam for diabetics  Never done   Yearly kidney health urinalysis for diabetes  09/30/2020   COVID-19 Vaccine (4 - 2023-24 season) 10/16/2021   Hemoglobin A1C  04/20/2022   Complete foot exam   10/21/2022   Yearly kidney function blood test for diabetes  02/13/2023   Medicare Annual Wellness Visit  02/17/2023   DTaP/Tdap/Td vaccine (4 - Td or Tdap) 01/18/2024   Pneumonia Vaccine  Completed   Flu Shot  Completed   Zoster (Shingles) Vaccine  Completed   HPV Vaccine  Aged Out    Advanced directives: Please bring a copy of your health care power of attorney and living will to the office to be added to your chart at your convenience.   Conditions/risks identified: Aim for 30 minutes of exercise or brisk walking, 6-8 glasses of water, and 5 servings of fruits and vegetables each day.   Next appointment: Follow up in one year for your annual wellness visit.   Preventive Care 15 Years and Older, Male  Preventive care refers to lifestyle choices and visits with your health care provider that can promote health and wellness. What does preventive care include? A yearly physical exam. This is also called an annual well check. Dental exams once or twice a year. Routine eye  exams. Ask your health care provider how often you should have your eyes checked. Personal lifestyle choices, including: Daily care of your teeth and gums. Regular physical activity. Eating a healthy diet. Avoiding tobacco and drug use. Limiting alcohol use. Practicing safe sex. Taking low doses of aspirin every day. Taking vitamin and mineral supplements as recommended by your health care provider. What happens during an annual well check? The services and screenings done by your health care provider during your  annual well check will depend on your age, overall health, lifestyle risk factors, and family history of disease. Counseling  Your health care provider may ask you questions about your: Alcohol use. Tobacco use. Drug use. Emotional well-being. Home and relationship well-being. Sexual activity. Eating habits. History of falls. Memory and ability to understand (cognition). Work and work Statistician. Screening  You may have the following tests or measurements: Height, weight, and BMI. Blood pressure. Lipid and cholesterol levels. These may be checked every 5 years, or more frequently if you are over 60 years old. Skin check. Lung cancer screening. You may have this screening every year starting at age 52 if you have a 30-pack-year history of smoking and currently smoke or have quit within the past 15 years. Fecal occult blood test (FOBT) of the stool. You may have this test every year starting at age 22. Flexible sigmoidoscopy or colonoscopy. You may have a sigmoidoscopy every 5 years or a colonoscopy every 10 years starting at age 31. Prostate cancer screening. Recommendations will vary depending on your family history and other risks. Hepatitis C blood test. Hepatitis B blood test. Sexually transmitted disease (STD) testing. Diabetes screening. This is done by checking your blood sugar (glucose) after you have not eaten for a while (fasting). You may have this done every 1-3 years. Abdominal aortic aneurysm (AAA) screening. You may need this if you are a current or former smoker. Osteoporosis. You may be screened starting at age 57 if you are at high risk. Talk with your health care provider about your test results, treatment options, and if necessary, the need for more tests. Vaccines  Your health care provider may recommend certain vaccines, such as: Influenza vaccine. This is recommended every year. Tetanus, diphtheria, and acellular pertussis (Tdap, Td) vaccine. You may need a Td  booster every 10 years. Zoster vaccine. You may need this after age 76. Pneumococcal 13-valent conjugate (PCV13) vaccine. One dose is recommended after age 37. Pneumococcal polysaccharide (PPSV23) vaccine. One dose is recommended after age 16. Talk to your health care provider about which screenings and vaccines you need and how often you need them. This information is not intended to replace advice given to you by your health care provider. Make sure you discuss any questions you have with your health care provider. Document Released: 02/28/2015 Document Revised: 10/22/2015 Document Reviewed: 12/03/2014 Elsevier Interactive Patient Education  2017 [employer] Prevention in the Home Falls can cause injuries. They can happen to people of all ages. There are many things you can do to make your home safe and to help prevent falls. What can I do on the outside of my home? Regularly fix the edges of walkways and driveways and fix any cracks. Remove anything that might make you trip as you walk through a door, such as a raised step or threshold. Trim any bushes or trees on the path to your home. Use bright outdoor lighting. Clear any walking paths of anything that might make someone trip, such as  rocks or tools. Regularly check to see if handrails are loose or broken. Make sure that both sides of any steps have handrails. Any raised decks and porches should have guardrails on the edges. Have any leaves, snow, or ice cleared regularly. Use sand or salt on walking paths during winter. Clean up any spills in your garage right away. This includes oil or grease spills. What can I do in the bathroom? Use night lights. Install grab bars by the toilet and in the tub and shower. Do not use towel bars as grab bars. Use non-skid mats or decals in the tub or shower. If you need to sit down in the shower, use a plastic, non-slip stool. Keep the floor dry. Clean up any water that spills on the floor  as soon as it happens. Remove soap buildup in the tub or shower regularly. Attach bath mats securely with double-sided non-slip rug tape. Do not have throw rugs and other things on the floor that can make you trip. What can I do in the bedroom? Use night lights. Make sure that you have a light by your bed that is easy to reach. Do not use any sheets or blankets that are too big for your bed. They should not hang down onto the floor. Have a firm chair that has side arms. You can use this for support while you get dressed. Do not have throw rugs and other things on the floor that can make you trip. What can I do in the kitchen? Clean up any spills right away. Avoid walking on wet floors. Keep items that you use a lot in easy-to-reach places. If you need to reach something above you, use a strong step stool that has a grab bar. Keep electrical cords out of the way. Do not use floor polish or wax that makes floors slippery. If you must use wax, use non-skid floor wax. Do not have throw rugs and other things on the floor that can make you trip. What can I do with my stairs? Do not leave any items on the stairs. Make sure that there are handrails on both sides of the stairs and use them. Fix handrails that are broken or loose. Make sure that handrails are as long as the stairways. Check any carpeting to make sure that it is firmly attached to the stairs. Fix any carpet that is loose or worn. Avoid having throw rugs at the top or bottom of the stairs. If you do have throw rugs, attach them to the floor with carpet tape. Make sure that you have a light switch at the top of the stairs and the bottom of the stairs. If you do not have them, ask someone to add them for you. What else can I do to help prevent falls? Wear shoes that: Do not have high heels. Have rubber bottoms. Are comfortable and fit you well. Are closed at the toe. Do not wear sandals. If you use a stepladder: Make sure that it is  fully opened. Do not climb a closed stepladder. Make sure that both sides of the stepladder are locked into place. Ask someone to hold it for you, if possible. Clearly mark and make sure that you can see: Any grab bars or handrails. First and last steps. Where the edge of each step is. Use tools that help you move around (mobility aids) if they are needed. These include: Canes. Walkers. Scooters. Crutches. Turn on the lights when you go into a dark  area. Replace any light bulbs as soon as they burn out. Set up your furniture so you have a clear path. Avoid moving your furniture around. If any of your floors are uneven, fix them. If there are any pets around you, be aware of where they are. Review your medicines with your doctor. Some medicines can make you feel dizzy. This can increase your chance of falling. Ask your doctor what other things that you can do to help prevent falls. This information is not intended to replace advice given to you by your health care provider. Make sure you discuss any questions you have with your health care provider. Document Released: 11/28/2008 Document Revised: 07/10/2015 Document Reviewed: 03/08/2014 Elsevier Interactive Patient Education  2017 [employer].

## 2022-02-16 NOTE — Progress Notes (Signed)
Subjective:   [name] is a 87 y.o. male who presents for Medicare Annual/Subsequent preventive examination. I connected with  [name] on 02/16/22 by a audio enabled telemedicine application and verified that I am speaking with the correct person using two identifiers.  Patient Location: Home  Provider Location: Home Office  I discussed the limitations of evaluation and management by telemedicine. The patient expressed understanding and agreed to proceed.  Review of Systems     Cardiac Risk Factors include: advanced age (>49mn, >>71women);diabetes mellitus;male gender;hypertension;dyslipidemia     Objective:    Today's Vitals   02/16/22 1451  Weight: 188 lb (85.3 kg)  Height: _0  (1.803 m)   Body mass index is 26.22 kg/m.     02/16/2022    2:55 PM 01/21/2022   11:41 AM 01/21/2022   11:31 AM 01/15/2022    9:26 AM 05/21/2021    4:22 PM 05/20/2021   12:00 AM 05/19/2021    1:43 PM  Advanced Directives  Does Patient Have a Medical Advance Directive? Yes No No No  Yes Yes  Type of AParamedicof AKramerLiving will    [place]uttleLiving will Healthcare Power of AIdaLiving will  Does patient want to make changes to medical advance directive?      No - Patient declined No - Patient declined  Copy of HArnettin Chart? No - copy requested    No - copy requested No - copy requested No - copy requested  Would patient like information on creating a medical advance directive?  No - Patient declined No - Patient declined No - Patient declined       Current Medications (verified) Outpatient Encounter Medications as of 02/16/2022  Medication Sig   amLODipine (NORVASC) 5 MG tablet Take 1 tablet (5 mg total) by mouth daily.   Calcium Carb-Cholecalciferol (CALCIUM 600 + D PO) Take 1 tablet by mouth daily.   clobetasol cream (TEMOVATE) 09.75% Apply 1 Application topically 2 (two) times  daily as needed (Psoriasis).   Coenzyme Q10 300 MG CAPS Take 300 mg by mouth daily.   colestipol (COLESTID) 1 g tablet Take 4 tablets (4 g total) by mouth daily. take it 4 hours apart from his other medications (Patient taking differently: Take 1 g by mouth daily. take it 4 hours apart from his other medications)   ELIQUIS 5 MG TABS tablet TAKE 1 TABLET BY MOUTH TWICE A DAY   fluticasone (CUTIVATE) 0.05 % cream Apply 1 Application topically 2 (two) times daily as needed (Psoriasis).   folic acid (FOLVITE) 1 MG tablet Take 1 mg by mouth daily.   lisinopril (ZESTRIL) 20 MG tablet TAKE 1/2 TABLET BY MOUTH EVERYDAY AT BEDTIME   metFORMIN (GLUCOPHAGE-XR) 500 MG 24 hr tablet TAKE 1 TABLET BY MOUTH EVERY DAY WITH BREAKFAST   methotrexate (RHEUMATREX) 2.5 MG tablet Take 15 mg by mouth once a week.   Multiple Vitamin (MULTIVITAMIN WITH MINERALS) TABS tablet Take 1 tablet by mouth daily.   omeprazole (PRILOSEC) 40 MG capsule Take 1 capsule (40 mg total) by mouth daily. (Patient taking differently: Take 40 mg by mouth at bedtime.)   rosuvastatin (CRESTOR) 5 MG tablet Take 1 tablet (5 mg total) by mouth daily. For cholesterol   silodosin (RAPAFLO) 4 MG CAPS capsule Take 1 capsule (4 mg total) by mouth daily with breakfast.   triamcinolone cream (KENALOG) 0.1 % Apply 1 Application topically  daily as needed (Psoriasis).   No facility-administered encounter medications on file as of 02/16/2022.    Allergies (verified) Flomax [tamsulosin hcl]   History: Past Medical History:  Diagnosis Date   Arthritis    knees, shoulder,  hips (10/24/2017)   Atrial fibrillation (HCC)    on eliquis   BPH (benign prostatic hyperplasia)    Cataracts, bilateral    removed by surgery   Coronary artery disease excluded    Diabetes mellitus without complication ([place])    type 2   Diverticulosis    Endocarditis, valve unspecified, unspecified cause    GERD (gastroesophageal reflux disease)    H/O mitral valve repair     Postoperative ring with postoperative atrial fibrillation   Hearing loss    both ears - does not use hearing aids   Heart murmur    hx   Hiatal hernia 06/13/2002   High cholesterol    History of kidney stones    passed spontaneously x2    OSA on CPAP    uses cpap nightly   Stroke ([place])    Unspecified essential hypertension    Weakness of both arms 07/19/2012   Wears dentures    upper and lower partial   Past Surgical History:  Procedure Laterality Date   BIOPSY  01/21/2022   Procedure: BIOPSY;  Surgeon: Harvel Quale, MD;  [facility];  Service: Gastroenterology;;   CARDIAC CATHETERIZATION  02/14/2007   x 2   CARDIOVERSION N/A 04/14/2016   Procedure: CARDIOVERSION;  Surgeon: Minus Breeding, MD;  Location: [place];  Service: Cardiovascular;  Laterality: N/A;   CATARACT EXTRACTION W/PHACO Right 05/12/2015   Procedure: CATARACT EXTRACTION PHACO AND INTRAOCULAR LENS PLACEMENT RIGHT EYE CDE=7.75;  Surgeon: Tonny Branch, MD;  [facility];  Service: Ophthalmology;  Laterality: Right;   CATARACT EXTRACTION W/PHACO Left 06/12/2015   Procedure: CATARACT EXTRACTION PHACO AND INTRAOCULAR LENS PLACEMENT (IOC);  Surgeon: Tonny Branch, MD;  [facility];  Service: Ophthalmology;  Laterality: Left;  CDE:  13.17   COLONOSCOPY     COLONOSCOPY WITH PROPOFOL N/A 01/21/2022   Procedure: COLONOSCOPY WITH PROPOFOL;  Surgeon: Harvel Quale, MD;  [facility];  Service: Gastroenterology;  Laterality: N/A;  115 ASA 3   ERCP N/A 05/21/2021   Procedure: ENDOSCOPIC RETROGRADE CHOLANGIOPANCREATOGRAPHY (ERCP);  Surgeon: Rogene Houston, MD;  [facility];  Service: Gastroenterology;  Laterality: N/A;   ERCP N/A 05/21/2021   Procedure: ENDOSCOPIC RETROGRADE CHOLANGIOPANCREATOGRAPHY (ERCP);  Surgeon: Rogene Houston, MD;  [facility];  Service: Gastroenterology;  Laterality: N/A;   ESOPHAGEAL DILATION N/A 05/21/2021   Procedure: ESOPHAGEAL DILATION;   Surgeon: Rogene Houston, MD;  [facility];  Service: Gastroenterology;  Laterality: N/A;   ESOPHAGEAL DILATION  01/21/2022   Procedure: ESOPHAGEAL DILATION;  Surgeon: Harvel Quale, MD;  [facility];  Service: Gastroenterology;;   ESOPHAGOGASTRODUODENOSCOPY N/A 05/21/2021   Procedure: ESOPHAGOGASTRODUODENOSCOPY (EGD);  Surgeon: Rogene Houston, MD;  [facility];  Service: Gastroenterology;  Laterality: N/A;   ESOPHAGOGASTRODUODENOSCOPY (EGD) WITH PROPOFOL N/A 01/21/2022   Procedure: ESOPHAGOGASTRODUODENOSCOPY (EGD) WITH PROPOFOL;  Surgeon: Harvel Quale, MD;  [facility];  Service: Gastroenterology;  Laterality: N/A;   EYE SURGERY     Bilateral cataracts   INJECTION KNEE Right 02/20/2016   Procedure: KNEE INJECTION;  Surgeon: [name], MD;  Location: [place];  Service: Orthopedics;  Laterality: Right;   JOINT REPLACEMENT     KNEE  ARTHROSCOPY Left    LAPAROSCOPIC CHOLECYSTECTOMY     MALONEY DILATION N/A 05/21/2021   Procedure: MALONEY DILATION;  Surgeon: Rogene Houston, MD;  [facility];  Service: Gastroenterology;  Laterality: N/A;   MITRAL VALVE REPAIR  ~ 2003   MULTIPLE TOOTH EXTRACTIONS     REMOVAL OF STONES  05/21/2021   Procedure: REMOVAL OF STONES;  Surgeon: Rogene Houston, MD;  [facility];  Service: Gastroenterology;;   SHOULDER ARTHROSCOPY WITH ROTATOR CUFF REPAIR AND OPEN BICEPS TENODESIS Right 01/26/2019   Procedure: right shoulder arthroscopy, rotator cuff repair and biceps tenodesis;  Surgeon: [name], MD;  Location: [place];  Service: Orthopedics;  Laterality: Right;   SHOULDER OPEN ROTATOR CUFF REPAIR Left    SPHINCTEROTOMY  05/21/2021   Procedure: SPHINCTEROTOMY;  Surgeon: Rogene Houston, MD;  [facility];  Service: Gastroenterology;;   TOTAL KNEE ARTHROPLASTY Left 02/20/2016   Procedure: LEFT TOTAL KNEE ARTHROPLASTY WITH RIGHT KNEE INJECTION;  Surgeon: [name], MD;  Location: [place];  Service:  Orthopedics;  Laterality: Left;   TOTAL KNEE ARTHROPLASTY Right 07/06/2019   Procedure: RIGHT TOTAL KNEE ARTHROPLASTY;  Surgeon: [name], MD;  Location: [place];  Service: Orthopedics;  Laterality: Right;   UPPER GI ENDOSCOPY     VASECTOMY     Family History  Problem Relation Age of Onset   Congestive Heart Failure Father    Heart disease Father    Stroke Brother    Stroke Sister    Diabetes Mellitus II Brother    Diabetes Mellitus II Sister    Lung cancer Brother    Stroke Sister    Stroke Brother    Colon cancer Neg Hx    Esophageal cancer Neg Hx    Rectal cancer Neg Hx    Stomach cancer Neg Hx    Social History   Socioeconomic History   Marital status: Married    Spouse name: GLORIA   Number of children: 2   Years of education: 12   Highest education level: High school graduate  Occupational History   Occupation:  Environmental manager    Comment: RETIRED  Tobacco Use   Smoking status: Former    Packs/day: 3.00    Years: 35.00    Total pack years: 105.00    Types: Cigarettes    Quit date: 02/16/1988    Years since quitting: 34.0    Passive exposure: Past   Smokeless tobacco: Never   Tobacco comments:     Year Quit: 1990   Vaping Use   Vaping Use: Never used  Substance and Sexual Activity   Alcohol use: No   Drug use: Never   Sexual activity: Not Currently  Other Topics Concern   Not on file  Social History Narrative   Not on file   Social Determinants of Health   Financial Resource Strain: Low Risk  (02/16/2022)   Overall Financial Resource Strain (CARDIA)    Difficulty of Paying Living Expenses: Not hard at all  Food Insecurity: No Food Insecurity (02/16/2022)   Hunger Vital Sign    Worried About Running Out of Food in the Last Year: Never true    Ran Out of Food in the Last Year: Never true  Transportation Needs: No Transportation Needs (02/16/2022)   PRAPARE - Hydrologist (Medical): No    Lack of Transportation  (Non-Medical): No  Physical Activity: Sufficiently Active (02/16/2022)   Exercise Vital Sign  Days of Exercise per Week: 3 days    Minutes of Exercise per Session: 60 min  Stress: No Stress Concern Present (02/16/2022)   [place]    Feeling of Stress : Not at all  Social Connections: Moderately Integrated (02/16/2022)   Social Connection and Isolation Panel [NHANES]    Frequency of Communication with Friends and Family: More than three times a week    Frequency of Social Gatherings with Friends and Family: More than three times a week    Attends Religious Services: More than 4 times per year    Active Member of [employer] or Organizations: No    Attends Music therapist: Never    Marital Status: Married    Tobacco Counseling Counseling given: Not Answered Tobacco comments:  Year Quit: 1990    Clinical Intake:  Pre-visit preparation completed: Yes  Pain : No/denies pain     Nutritional Risks: None Diabetes: Yes CBG done?: No Did pt. bring in CBG monitor from home?: No  How often do you need to have someone help you when you read instructions, pamphlets, or other written materials from your doctor or pharmacy?: 1 - Never  Diabetic?yes  Nutrition Risk Assessment:  Has the patient had any N/V/D within the last 2 months?  No  Does the patient have any non-healing wounds?  No  Has the patient had any unintentional weight loss or weight gain?  No   Diabetes:  Is the patient diabetic?  Yes  If diabetic, was a CBG obtained today?  No  Did the patient bring in their glucometer from home?  No  How often do you monitor your CBG's? Never .   Financial Strains and Diabetes Management:  Are you having any financial strains with the device, your supplies or your medication? No .  Does the patient want to be seen by Chronic Care Management for management of their diabetes?  No  Would the patient like to be  referred to a Nutritionist or for Diabetic Management?  No   Diabetic Exams:  Diabetic Eye Exam: Completed 03/2021 Diabetic Foot Exam: Overdue, Pt has been advised about the importance in completing this exam. Pt is scheduled for diabetic foot exam on next office visit .   Interpreter Needed?: No  Information entered by :: Jadene Pierini, LPN   Activities of Daily Living    02/16/2022    2:55 PM 02/12/2022    3:30 PM  In your present state of health, do you have any difficulty performing the following activities:  Hearing? 0 1  Vision? 0 0  Difficulty concentrating or making decisions? 0 0  Walking or climbing stairs? 0 0  Dressing or bathing? 0 0  Doing errands, shopping? 0 0  Preparing Food and eating ? N N  Using the Toilet? N N  In the past six months, have you accidently leaked urine? N N  Do you have problems with loss of bowel control? N N  Managing your Medications? N N  Managing your Finances? N N  Housekeeping or managing your Housekeeping? N N    Patient Care Team: Claretta Fraise, MD as PCP - General (Family Medicine) Minus Breeding, MD as PCP - Cardiology (Cardiology) Steffanie Rainwater, DPM as Consulting Physician (Podiatry) de Stanford Scotland, MD (Inactive) as Attending Physician (Cardiology) Celestia Khat, OD (Optometry)  Indicate any recent Medical Services you may have received from other than [facility] providers in the past year (date  may be approximate).     Assessment:   This is a routine wellness examination for Geordie.  Hearing/Vision screen Vision Screening - Comments:: Wears rx glasses - up to date with routine eye exams with  My eye doctor   Dietary issues and exercise activities discussed: Current Exercise Habits: Home exercise routine, Type of exercise: walking, Time (Minutes): 60, Frequency (Times/Week): 3, Weekly Exercise (Minutes/Week): 180, Intensity: Mild, Exercise limited by: None identified   Goals Addressed             This Visit's Progress     Exercise 150 min/wk Moderate Activity   On track    02/06/2020 AWV Goal: Exercise for General Health  Patient will verbalize understanding of the benefits of increased physical activity: Exercising regularly is important. It will improve your overall fitness, flexibility, and endurance. Regular exercise also will improve your overall health. It can help you control your weight, reduce stress, and improve your bone density. Over the next year, patient will increase physical activity as tolerated with a goal of at least 150 minutes of moderate physical activity per week.  You can tell that you are exercising at a moderate intensity if your heart starts beating faster and you start breathing faster but can still hold a conversation. Moderate-intensity exercise ideas include: Walking 1 mile (1.6 km) in about 15 minutes Biking Hiking Golfing Dancing Water aerobics Patient will verbalize understanding of everyday activities that increase physical activity by providing examples like the following: Yard work, such as: Sales promotion account executive Gardening Washing windows or floors Patient will be able to explain general safety guidelines for exercising:  Before you start a new exercise program, talk with your health care provider. Do not exercise so much that you hurt yourself, feel dizzy, or get very short of breath. Wear comfortable clothes and wear shoes with good support. Drink plenty of water while you exercise to prevent dehydration or heat stroke. Work out until your breathing and your heartbeat get faster.      Plan meals   On track    Eat healthy  Stay busy  Play golf again         Depression Screen    02/16/2022    2:54 PM 10/20/2021   10:39 AM 07/15/2021   10:20 AM 05/18/2021    9:07 AM 04/06/2021    8:53 AM 02/12/2021    2:37 PM 01/01/2021    8:23 AM  PHQ 2/9 Scores  PHQ - 2 Score 0 0 0 0 0 0 0   PHQ- 9 Score       0    Fall Risk    02/16/2022    2:52 PM 02/12/2022    3:30 PM 10/20/2021   10:39 AM 07/15/2021   10:20 AM 05/18/2021    9:07 AM  [place] in the past year? 0 0 0 0 0  Number falls in past yr: 0      Injury with Fall? 0      Risk for fall due to : No Fall Risks      Follow up Falls prevention discussed        [place]:  Any stairs in or around the home? No  If so, are there any without handrails? No  Home free of loose throw rugs in walkways, pet beds, electrical cords, etc? Yes  Adequate lighting in  your home to reduce risk of falls? Yes   ASSISTIVE DEVICES UTILIZED TO PREVENT FALLS:  Life alert? No  Use of a cane, walker or w/c? No  Grab bars in the bathroom? No  Shower chair or bench in shower? No  Elevated toilet seat or a handicapped toilet? No        02/16/2022    2:56 PM 02/12/2021    2:41 PM 02/06/2020    8:57 AM 02/05/2019    9:47 AM  6CIT Screen  What Year? 0 points 0 points 0 points 0 points  What month? 0 points 0 points 0 points 0 points  What time? 0 points 0 points 0 points 0 points  Count back from 20 0 points 0 points 0 points 0 points  Months in reverse 0 points 0 points 0 points 0 points  Repeat phrase 0 points 0 points 0 points 4 points  Total Score 0 points 0 points 0 points 4 points    Immunizations Immunization History  Administered Date(s) Administered   Fluad Quad(high Dose 65+) 11/14/2018   Influenza Inj Mdck Quad Pf 11/14/2018   Influenza Nasal 11/29/2016   Influenza Split 01/15/2004, 11/15/2013, 12/03/2015, 11/28/2019   Influenza, High Dose Seasonal PF 11/25/2014, 11/29/2016, 11/24/2017, 11/22/2019, 11/19/2020   Influenza, Seasonal, Injecte, Preservative Fre 11/15/2012   Influenza-Unspecified 01/23/2008, 11/15/2012, 11/29/2013, 11/25/2014, 11/29/2016, 11/24/2017, 11/14/2018, 11/22/2019   Moderna SARS-COV2 Booster Vaccination 12/10/2019   Moderna Sars-Covid-2 Vaccination  03/20/2019, 04/17/2019, 12/10/2019   Pneumococcal Conjugate-13 01/22/2015   Pneumococcal Polysaccharide-23 02/15/2006   Td 02/16/1995   Tdap 09/21/1994, 01/17/2014   Zoster Recombinat (Shingrix) 07/28/2020, 10/30/2020    TDAP status: Up to date  Flu Vaccine status: Up to date  Pneumococcal vaccine status: Up to date  Covid-19 vaccine status: Information provided on how to obtain vaccines.   Qualifies for Shingles Vaccine? Yes   Zostavax completed Yes   Shingrix Completed?: Yes  Screening Tests Health Maintenance  Topic Date Due   OPHTHALMOLOGY EXAM  Never done   Diabetic kidney evaluation - Urine ACR  09/30/2020   COVID-19 Vaccine (4 - 2023-24 season) 10/16/2021   HEMOGLOBIN A1C  04/20/2022   FOOT EXAM  10/21/2022   Diabetic kidney evaluation - eGFR measurement  02/13/2023   Medicare Annual Wellness (AWV)  02/17/2023   DTaP/Tdap/Td (4 - Td or Tdap) 01/18/2024   Pneumonia Vaccine 10+ Years old  Completed   INFLUENZA VACCINE  Completed   Zoster Vaccines- Shingrix  Completed   HPV VACCINES  Aged Out    Health Maintenance  Health Maintenance Due  Topic Date Due   OPHTHALMOLOGY EXAM  Never done   Diabetic kidney evaluation - Urine ACR  09/30/2020   COVID-19 Vaccine (4 - 2023-24 season) 10/16/2021    Colorectal cancer screening: No longer required.   Lung Cancer Screening: (Low Dose CT Chest recommended if Age 36-80 years, 30 pack-year currently smoking OR have quit w/in 15years.) does not qualify.   Lung Cancer Screening Referral: n/a  Additional Screening:  Hepatitis C Screening: does not qualify;   Vision Screening: Recommended annual ophthalmology exams for early detection of glaucoma and other disorders of the eye. Is the patient up to date with their annual eye exam?  Yes  Who is the provider or what is the name of the office in which the patient attends annual eye exams? [facility]  If pt is not established with a provider, would they like to be referred  to a provider to establish care?  No .   Dental Screening: Recommended annual dental exams for proper oral hygiene  Community Resource Referral / Chronic Care Management: CRR required this visit?  No   CCM required this visit?  No      Plan:     I have personally reviewed and noted the following in the patient's chart:   Medical and social history Use of alcohol, tobacco or illicit drugs  Current medications and supplements including opioid prescriptions. Patient is not currently taking opioid prescriptions. Functional ability and status Nutritional status Physical activity Advanced directives List of other physicians Hospitalizations, surgeries, and ER visits in previous 12 months Vitals Screenings to include cognitive, depression, and falls Referrals and appointments  In addition, I have reviewed and discussed with patient certain preventive protocols, quality metrics, and best practice recommendations. A written personalized care plan for preventive services as well as general preventive health recommendations were provided to patient.     Daphane Shepherd, LPN   [id number]   Nurse Notes: none

## 2022-02-17 DIAGNOSIS — Z23 Encounter for immunization: Secondary | ICD-10-CM | POA: Diagnosis not present

## 2022-04-01 ENCOUNTER — Other Ambulatory Visit (HOSPITAL_COMMUNITY)
Admission: RE | Admit: 2022-04-01 | Discharge: 2022-04-01 | Disposition: A | Discharge status: Home / Self Care | Payer: Medicare Other | Source: Ambulatory Visit | Attending: Internal Medicine | Admitting: Internal Medicine

## 2022-04-01 DIAGNOSIS — L400 Psoriasis vulgaris: Secondary | ICD-10-CM | POA: Insufficient documentation

## 2022-04-01 DIAGNOSIS — Z79899 Other long term (current) drug therapy: Secondary | ICD-10-CM | POA: Diagnosis not present

## 2022-04-01 DIAGNOSIS — L0202 Furuncle of face: Secondary | ICD-10-CM | POA: Diagnosis not present

## 2022-04-01 LAB — CBC WITH DIFFERENTIAL/PLATELET
Abs Immature Granulocytes: 0.01 10*3/uL (ref 0.00–0.07)
Basophils Absolute: 0 10*3/uL (ref 0.0–0.1)
Basophils Relative: 0 %
Eosinophils Absolute: 0.1 10*3/uL (ref 0.0–0.5)
Eosinophils Relative: 2 %
HCT: 38.6 % — ABNORMAL LOW (ref 39.0–52.0)
Hemoglobin: 13 g/dL (ref 13.0–17.0)
Immature Granulocytes: 0 %
Lymphocytes Relative: 21 %
Lymphs Abs: 1 10*3/uL (ref 0.7–4.0)
MCH: 33.5 pg (ref 26.0–34.0)
MCHC: 33.7 g/dL (ref 30.0–36.0)
MCV: 99.5 fL (ref 80.0–100.0)
Monocytes Absolute: 0.8 10*3/uL (ref 0.1–1.0)
Monocytes Relative: 16 %
Neutro Abs: 3 10*3/uL (ref 1.7–7.7)
Neutrophils Relative %: 61 %
Platelets: 217 10*3/uL (ref 150–400)
RBC: 3.88 MIL/uL — ABNORMAL LOW (ref 4.22–5.81)
RDW: 13.7 % (ref 11.5–15.5)
WBC: 4.9 10*3/uL (ref 4.0–10.5)
nRBC: 0 % (ref 0.0–0.2)

## 2022-04-01 LAB — COMPREHENSIVE METABOLIC PANEL
ALT: 15 U/L (ref 0–44)
AST: 19 U/L (ref 15–41)
Albumin: 4 g/dL (ref 3.5–5.0)
Alkaline Phosphatase: 62 U/L (ref 38–126)
Anion gap: 6 (ref 5–15)
BUN: 12 mg/dL (ref 8–23)
CO2: 28 mmol/L (ref 22–32)
Calcium: 8.6 mg/dL — ABNORMAL LOW (ref 8.9–10.3)
Chloride: 104 mmol/L (ref 98–111)
Creatinine, Ser: 0.69 mg/dL (ref 0.61–1.24)
GFR, Estimated: >60 mL/min (ref >60)
Glucose, Bld: 103 mg/dL — ABNORMAL HIGH (ref 70–99)
Potassium: 4 mmol/L (ref 3.5–5.1)
Sodium: 138 mmol/L (ref 135–145)
Total Bilirubin: 0.9 mg/dL (ref 0.3–1.2)
Total Protein: 7.3 g/dL (ref 6.5–8.1)

## 2022-04-01 LAB — LIPID PANEL
Cholesterol: 97 mg/dL (ref 0–200)
HDL: 48 mg/dL (ref >40)
LDL Cholesterol: 24 mg/dL (ref 0–99)
Total CHOL/HDL Ratio: 2 RATIO
Triglycerides: 125 mg/dL (ref <150)
VLDL: 25 mg/dL (ref 0–40)

## 2022-04-15 DIAGNOSIS — G4733 Obstructive sleep apnea (adult) (pediatric): Secondary | ICD-10-CM | POA: Diagnosis not present

## 2022-04-19 ENCOUNTER — Encounter: Payer: Self-pay | Admitting: Family Medicine

## 2022-04-19 ENCOUNTER — Ambulatory Visit (INDEPENDENT_AMBULATORY_CARE_PROVIDER_SITE_OTHER): Payer: Medicare Other | Admitting: Family Medicine

## 2022-04-19 VITALS — BP 112/60 | HR 51 | Temp 97.4°F | Ht 71.0 in | Wt 193.6 lb

## 2022-04-19 DIAGNOSIS — E1140 Type 2 diabetes mellitus with diabetic neuropathy, unspecified: Secondary | ICD-10-CM

## 2022-04-19 DIAGNOSIS — E119 Type 2 diabetes mellitus without complications: Secondary | ICD-10-CM

## 2022-04-19 DIAGNOSIS — I10 Essential (primary) hypertension: Secondary | ICD-10-CM | POA: Diagnosis not present

## 2022-04-19 LAB — BAYER DCA HB A1C WAIVED: HB A1C (BAYER DCA - WAIVED): 6.1 % — ABNORMAL HIGH (ref 4.8–5.6)

## 2022-04-19 MED ORDER — LISINOPRIL 20 MG PO TABS: ORAL_TABLET | ORAL | 1 refills | Status: DC

## 2022-04-19 MED ORDER — ROSUVASTATIN CALCIUM 5 MG PO TABS: 5.0000 mg | ORAL_TABLET | Freq: Every day | ORAL | 3 refills | Status: DC

## 2022-04-19 MED ORDER — APIXABAN 5 MG PO TABS: 5.0000 mg | ORAL_TABLET | Freq: Two times a day (BID) | ORAL | 3 refills | Status: DC

## 2022-04-19 MED ORDER — AMLODIPINE BESYLATE 2.5 MG PO TABS: 2.5000 mg | ORAL_TABLET | Freq: Every day | ORAL | 3 refills | Status: DC

## 2022-04-19 MED ORDER — OMEPRAZOLE 40 MG PO CPDR: 40.0000 mg | DELAYED_RELEASE_CAPSULE | Freq: Every day | ORAL | 3 refills | Status: DC

## 2022-04-19 MED ORDER — METFORMIN HCL ER 500 MG PO TB24: ORAL_TABLET | ORAL | 3 refills | Status: DC

## 2022-04-19 NOTE — Progress Notes (Signed)
Subjective:  Patient ID: [name],  male    DOB: 10/25/1935  Age: 87 y.o.    CC: Medical Management of Chronic Issues   HPI [name] presents for  follow-up of hypertension. Patient has no history of headache chest pain or shortness of breath or recent cough. Patient also denies symptoms of TIA such as numbness weakness lateralizing. Patient denies side effects from medication. States taking it regularly.  Patient also  in for follow-up of elevated cholesterol. Doing well without complaints on current medication. Denies side effects  including myalgia and arthralgia and nausea. Also in today for liver function testing. Currently no chest pain, shortness of breath or other cardiovascular related symptoms noted.  Follow-up of diabetes. Patient does check blood sugar at home. Patient denies symptoms such as excessive hunger or urinary frequency, excessive hunger, nausea No significant hypoglycemic spells noted. Medications reviewed. Pt reports taking them regularly. Pt. denies complication/adverse reaction today.   Patient in for follow-up of GERD. Currently asymptomatic taking  PPI daily. There is no chest pain or heartburn. No hematemesis and no melena. No dysphagia or choking. Onset is remote. Progression is stable. Complicating factors, none.  History [name] has a past medical history of Arthritis, Atrial fibrillation ([place]), BPH (benign prostatic hyperplasia), Cataracts, bilateral, Coronary artery disease excluded, Diabetes mellitus without complication ([place]), Diverticulosis, Endocarditis, valve unspecified, unspecified cause, GERD (gastroesophageal reflux disease), H/O mitral valve repair, Hearing loss, Heart murmur, Hiatal hernia (06/13/2002), High cholesterol, History of kidney stones, OSA on CPAP, Stroke ([place]), Unspecified essential hypertension, Weakness of both arms (07/19/2012), and Wears dentures.   He has a past surgical history that includes Vasectomy; Mitral valve  repair (~ 2003); Cataract extraction w/PHACO (Right, 05/12/2015); Cataract extraction w/PHACO (Left, 06/12/2015); Total knee arthroplasty (Left, 02/20/2016); Injection knee (Right, 02/20/2016); Cardioversion (N/A, 04/14/2016); Laparoscopic cholecystectomy; Knee arthroscopy (Left); Joint replacement; Shoulder open rotator cuff repair (Left); Colonoscopy; Upper gi endoscopy; Multiple tooth extractions; Cardiac catheterization (02/14/2007); Shoulder arthroscopy with rotator cuff repair and open biceps tenodesis (Right, 01/26/2019); Eye surgery; Total knee arthroplasty (Right, 07/06/2019); Esophagogastroduodenoscopy (N/A, 05/21/2021); ERCP (N/A, 05/21/2021); maloney dilation (N/A, 05/21/2021); sphincterotomy (05/21/2021); removal of stones (05/21/2021); Esophageal dilation (N/A, 05/21/2021); ERCP (N/A, 05/21/2021); Colonoscopy with propofol (N/A, 01/21/2022); Esophagogastroduodenoscopy (egd) with propofol (N/A, 01/21/2022); Esophageal dilation (01/21/2022); and biopsy (01/21/2022).   His family history includes Congestive Heart Failure in his father; Diabetes Mellitus II in his brother and sister; Heart disease in his father; Lung cancer in his brother; Stroke in his brother, brother, sister, and sister.He reports that he quit smoking about 34 years ago. His smoking use included cigarettes. He has a 105.00 pack-year smoking history. He has been exposed to tobacco smoke. He has never used smokeless tobacco. He reports that he does not drink alcohol and does not use drugs.  Current Outpatient Medications on File Prior to Visit  Medication Sig Dispense Refill   Calcium Carb-Cholecalciferol (CALCIUM 600 + D PO) Take 1 tablet by mouth daily.     clobetasol cream (TEMOVATE) [id number] % Apply 1 Application topically 2 (two) times daily as needed (Psoriasis).     Coenzyme Q10 300 MG CAPS Take 300 mg by mouth daily.     colestipol (COLESTID) 1 g tablet Take 4 tablets (4 g total) by mouth daily. take it 4 hours apart from his other medications  (Patient taking differently: Take 1 g by mouth daily. take it 4 hours apart from his other medications) 360 tablet 1   fluticasone (CUTIVATE) 0.05 % cream  Apply 1 Application topically 2 (two) times daily as needed (Psoriasis).     folic acid (FOLVITE) 1 MG tablet Take 1 mg by mouth daily.     methotrexate (RHEUMATREX) 2.5 MG tablet Take 15 mg by mouth once a week.     Multiple Vitamin (MULTIVITAMIN WITH MINERALS) TABS tablet Take 1 tablet by mouth daily.     silodosin (RAPAFLO) 4 MG CAPS capsule Take 1 capsule (4 mg total) by mouth daily with breakfast. 90 capsule 3   triamcinolone cream (KENALOG) 0.1 % Apply 1 Application topically daily as needed (Psoriasis).     No current facility-administered medications on file prior to visit.    ROS Review of Systems  Constitutional:  Negative for fever.  Respiratory:  Negative for shortness of breath.   Cardiovascular:  Negative for chest pain.  Musculoskeletal:  Negative for arthralgias.  Skin:  Negative for rash.    Objective:  BP 112/60   Pulse (!) 51   Temp (!) 97.4 F (36.3 C)   Ht '5\' 11"'$  (1.803 m)   Wt 193 lb 9.6 oz (87.8 kg)   SpO2 100%   BMI 27.00 kg/m   BP Readings from Last 3 Encounters:  04/19/22 112/60  01/21/22 (!) 90/42  12/17/21 (!) 102/55    Wt Readings from Last 3 Encounters:  04/19/22 193 lb 9.6 oz (87.8 kg)  02/16/22 188 lb (85.3 kg)  01/21/22 184 lb 15.5 oz (83.9 kg)     Physical Exam Vitals reviewed.  Constitutional:      Appearance: He is well-developed.  HENT:     Head: Normocephalic and atraumatic.     Right Ear: External ear normal.     Left Ear: External ear normal.     Mouth/Throat:     Pharynx: No oropharyngeal exudate or posterior oropharyngeal erythema.  Eyes:     Pupils: Pupils are equal, round, and reactive to light.  Cardiovascular:     Rate and Rhythm: Normal rate and regular rhythm.     Heart sounds: No murmur heard. Pulmonary:     Effort: No respiratory distress.     Breath  sounds: Normal breath sounds.  Musculoskeletal:     Cervical back: Normal range of motion and neck supple.  Neurological:     Mental Status: He is alert and oriented to person, place, and time.     Diabetic Foot Exam - Simple   No data filed     Lab Results  Component Value Date   HGBA1C 6.1 (H) 04/19/2022   HGBA1C 5.6 10/20/2021   HGBA1C 5.9 (H) 07/15/2021    Assessment & Plan:   Charith was seen today for medical management of chronic issues.  Diagnoses and all orders for this visit:  Type 2 diabetes mellitus with diabetic neuropathy, unspecified whether long term insulin use ([place]) -     Bayer DCA Hb A1c Waived -     Microalbumin / creatinine urine ratio  Essential hypertension -     CBC with Differential/Platelet -     CMP14+EGFR  Type 2 diabetes mellitus without complication, without long-term current use of insulin (H[facility]) -     rosuvastatin (CRESTOR) 5 MG tablet; Take 1 tablet (5 mg total) by mouth daily. For cholesterol  Other orders -     amLODipine (NORVASC) 2.5 MG tablet; Take 1 tablet (2.5 mg total) by mouth daily. -     apixaban (ELIQUIS) 5 MG TABS tablet; Take 1 tablet (5 mg total) by mouth 2 (two)  times daily. -     lisinopril (ZESTRIL) 20 MG tablet; TAKE 1/2 TABLET BY MOUTH EVERYDAY AT BEDTIME -     metFORMIN (GLUCOPHAGE-XR) 500 MG 24 hr tablet; TAKE 1 TABLET BY MOUTH EVERY DAY WITH BREAKFAST -     omeprazole (PRILOSEC) 40 MG capsule; Take 1 capsule (40 mg total) by mouth daily.   I have changed Tarren A. Bench's Eliquis to apixaban. I have also changed his amLODipine. I am also having him maintain his multivitamin with minerals, Calcium Carb-Cholecalciferol (CALCIUM 600 + D PO), Coenzyme Q10, silodosin, methotrexate, colestipol, folic acid, clobetasol cream, fluticasone, triamcinolone cream, lisinopril, metFORMIN, omeprazole, and rosuvastatin.  Meds ordered this encounter  Medications   amLODipine (NORVASC) 2.5 MG tablet    Sig: Take 1 tablet (2.5 mg  total) by mouth daily.    Dispense:  90 tablet    Refill:  3   apixaban (ELIQUIS) 5 MG TABS tablet    Sig: Take 1 tablet (5 mg total) by mouth 2 (two) times daily.    Dispense:  180 tablet    Refill:  3   lisinopril (ZESTRIL) 20 MG tablet    Sig: TAKE 1/2 TABLET BY MOUTH EVERYDAY AT BEDTIME    Dispense:  90 tablet    Refill:  1   metFORMIN (GLUCOPHAGE-XR) 500 MG 24 hr tablet    Sig: TAKE 1 TABLET BY MOUTH EVERY DAY WITH BREAKFAST    Dispense:  90 tablet    Refill:  3   omeprazole (PRILOSEC) 40 MG capsule    Sig: Take 1 capsule (40 mg total) by mouth daily.    Dispense:  90 capsule    Refill:  3   rosuvastatin (CRESTOR) 5 MG tablet    Sig: Take 1 tablet (5 mg total) by mouth daily. For cholesterol    Dispense:  90 tablet    Refill:  3     Follow-up: Return in about 3 months (around 07/20/2022) for hypertension, diabetes.  Claretta Fraise, M.D.

## 2022-04-20 LAB — CBC WITH DIFFERENTIAL/PLATELET
Basophils Absolute: 0 10*3/uL (ref 0.0–0.2)
Basos: 1 %
EOS (ABSOLUTE): 0.1 10*3/uL (ref 0.0–0.4)
Eos: 2 %
Hematocrit: 40.7 % (ref 37.5–51.0)
Hemoglobin: 14.3 g/dL (ref 13.0–17.7)
Immature Grans (Abs): 0 10*3/uL (ref 0.0–0.1)
Immature Granulocytes: 0 %
Lymphocytes Absolute: 1.1 10*3/uL (ref 0.7–3.1)
Lymphs: 20 %
MCH: 33.7 pg — ABNORMAL HIGH (ref 26.6–33.0)
MCHC: 35.1 g/dL (ref 31.5–35.7)
MCV: 96 fL (ref 79–97)
Monocytes Absolute: 0.6 10*3/uL (ref 0.1–0.9)
Monocytes: 12 %
Neutrophils Absolute: 3.5 10*3/uL (ref 1.4–7.0)
Neutrophils: 65 %
Platelets: 225 10*3/uL (ref 150–450)
RBC: 4.24 x10E6/uL (ref 4.14–5.80)
RDW: 12.9 % (ref 11.6–15.4)
WBC: 5.3 10*3/uL (ref 3.4–10.8)

## 2022-04-20 LAB — CMP14+EGFR
ALT: 16 IU/L (ref 0–44)
AST: 18 IU/L (ref 0–40)
Albumin/Globulin Ratio: 1.8 (ref 1.2–2.2)
Albumin: 4.5 g/dL (ref 3.7–4.7)
Alkaline Phosphatase: 74 IU/L (ref 44–121)
BUN/Creatinine Ratio: 25 — ABNORMAL HIGH (ref 10–24)
BUN: 19 mg/dL (ref 8–27)
Bilirubin Total: 1 mg/dL (ref 0.0–1.2)
CO2: 25 mmol/L (ref 20–29)
Calcium: 9.1 mg/dL (ref 8.6–10.2)
Chloride: 100 mmol/L (ref 96–106)
Creatinine, Ser: 0.77 mg/dL (ref 0.76–1.27)
Globulin, Total: 2.5 g/dL (ref 1.5–4.5)
Glucose: 108 mg/dL — ABNORMAL HIGH (ref 70–99)
Potassium: 4.7 mmol/L (ref 3.5–5.2)
Sodium: 140 mmol/L (ref 134–144)
Total Protein: 7 g/dL (ref 6.0–8.5)
eGFR: 88 mL/min/{1.73_m2} (ref >59)

## 2022-04-20 LAB — MICROALBUMIN / CREATININE URINE RATIO
Creatinine, Urine: 73.3 mg/dL
Microalb/Creat Ratio: 33 mg/g creat — ABNORMAL HIGH (ref 0–29)
Microalbumin, Urine: 24.5 ug/mL

## 2022-04-20 NOTE — Progress Notes (Signed)
Hello Lum,  Your lab result is normal and/or stable.Some minor variations that are not significant are commonly marked abnormal, but do not represent any medical problem for you.  Best regards, Claretta Fraise, M.D.

## 2022-05-07 DIAGNOSIS — G4733 Obstructive sleep apnea (adult) (pediatric): Secondary | ICD-10-CM | POA: Diagnosis not present

## 2022-06-02 LAB — HM DIABETES EYE EXAM

## 2022-06-03 DIAGNOSIS — R310 Gross hematuria: Secondary | ICD-10-CM | POA: Diagnosis not present

## 2022-06-03 DIAGNOSIS — R3915 Urgency of urination: Secondary | ICD-10-CM | POA: Diagnosis not present

## 2022-06-09 DIAGNOSIS — G4733 Obstructive sleep apnea (adult) (pediatric): Secondary | ICD-10-CM | POA: Diagnosis not present

## 2022-06-10 ENCOUNTER — Other Ambulatory Visit (INDEPENDENT_AMBULATORY_CARE_PROVIDER_SITE_OTHER): Payer: Self-pay | Admitting: Gastroenterology

## 2022-06-10 DIAGNOSIS — K529 Noninfective gastroenteritis and colitis, unspecified: Secondary | ICD-10-CM

## 2022-06-21 ENCOUNTER — Ambulatory Visit (INDEPENDENT_AMBULATORY_CARE_PROVIDER_SITE_OTHER): Payer: Medicare Other | Admitting: Gastroenterology

## 2022-06-21 ENCOUNTER — Encounter (INDEPENDENT_AMBULATORY_CARE_PROVIDER_SITE_OTHER): Payer: Self-pay | Admitting: Gastroenterology

## 2022-06-21 VITALS — BP 124/66 | HR 46 | Temp 98.0°F | Ht 71.0 in | Wt 195.0 lb

## 2022-06-21 DIAGNOSIS — K219 Gastro-esophageal reflux disease without esophagitis: Secondary | ICD-10-CM

## 2022-06-21 DIAGNOSIS — R1310 Dysphagia, unspecified: Secondary | ICD-10-CM | POA: Diagnosis not present

## 2022-06-21 DIAGNOSIS — K529 Noninfective gastroenteritis and colitis, unspecified: Secondary | ICD-10-CM | POA: Diagnosis not present

## 2022-06-21 MED ORDER — OMEPRAZOLE 40 MG PO CPDR: 40.0000 mg | DELAYED_RELEASE_CAPSULE | Freq: Every day | ORAL | 3 refills | Status: DC

## 2022-06-21 MED ORDER — COLESTIPOL HCL 1 G PO TABS: 4.0000 g | ORAL_TABLET | Freq: Every day | ORAL | 3 refills | Status: DC

## 2022-06-21 NOTE — Patient Instructions (Addendum)
Schedule barium esophagram with pill Continue  colestipol 4 g every day Continue omeprazole 40 mg daily.

## 2022-06-21 NOTE — Progress Notes (Unsigned)
Katrinka Blazing, M.D. Gastroenterology & Hepatology [facility] Gastroenter[facility] [address] [place], [place] [address]  Primary Care Physician: Mechele Claude, MD [address] [place] [address]  I will communicate my assessment and recommendations to the referring MD via EMR.  Problems: Choledocholithiasis Chronic diarrhea Dysphagia  History of Present Illness: WISTER MOES is a 87 y.o. male with past medical history of atrial fibrillation, BPH, diabetes, stroke, OSA, hyperlipidemia, GERD, hypertension, who presents for follow up of chronic diarrhea and dysphagia.   The patient was last seen on 12/17/21. At that time, the patient was scheduled for EGD and colonoscopy.  He had his colestipol increased to 4 g every day and he was continued omeprazole 40 mg daily.  Celiac serology, fecal fat, TSH were checked which were within normal limits, however the pancreatic elastase was decreased at 108.  Patient was given a prescription for possible EPI with Creon, however this was not covered and I sent Zenpep.  Unfortunately, he developed a rash a few months after starting Zenpep and he was advised to stop taking the medication. He is now on colestipol 4 g qday.  Patient reports his stools are more formed, still sometimes a little mushy. Can have between 2-4 bowel movements per day. Some very scant fecal soiling when passing gas. When he takes his medicine he feels when eating some meals, food can get caught in his chest - thinks this is happening same frequency as before. Has been taking his medications compliantly. The patient denies having any nausea, vomiting, fever, chills, hematochezia, melena, hematemesis, abdominal distention, abdominal pain, jaundice, pruritus.Marland Kitchen lost 15 lb while doing weight watchers.  Last EGD: 01/21/2022. No esophageal abnormalities, empirically dilated with an 18 mm savory.  Biopsies from esophagus suggestive of reflux changes.  2 cm  hiatal hernia, normal stomach and duodenum.   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, normal colon (nonspecific colitis), diverticulosis  Past Medical History: Past Medical History:  Diagnosis Date   Arthritis    knees, shoulder,  hips (10/24/2017)   Atrial fibrillation (HCC)    on eliquis   BPH (benign prostatic hyperplasia)    Cataracts, bilateral    removed by surgery   Coronary artery disease excluded    Diabetes mellitus without complication (HCC)    type 2   Diverticulosis    Endocarditis, valve unspecified, unspecified cause    GERD (gastroesophageal reflux disease)    H/O mitral valve repair    Postoperative ring with postoperative atrial fibrillation   Hearing loss    both ears - does not use hearing aids   Heart murmur    hx   Hiatal hernia 06/13/2002   High cholesterol    History of kidney stones    passed spontaneously x2    OSA on CPAP    uses cpap nightly   Stroke [facility])    Unspecified essential hypertension    Weakness of both arms 07/19/2012   Wears dentures    upper and lower partial    Past Surgical History: Past Surgical History:  Procedure Laterality Date   BIOPSY  01/21/2022   Procedure: BIOPSY;  Surgeon: Dolores Frame, MD;  [facility];  Service: Gastroenterology;;   CARDIAC CATHETERIZATION  02/14/2007   x 2   CARDIOVERSION N/A 04/14/2016   Procedure: CARDIOVERSION;  Surgeon: Rollene Rotunda, MD;  Location[facility] ENDOSCOPY;  Service: Cardiovascular;  Laterality: N/A;   CATARACT EXTRACTION W/PHACO Right 05/12/2015   Procedure:  CATARACT EXTRACTION PHACO AND INTRAOCULAR LENS PLACEMENT RIGHT EYE CDE=7.75;  Surgeon: Gemma Payor, MD;  Location: [facility] ORS;  Service: Ophthalmology;  Laterality: Right;   CATARACT EXTRACTION W/PHACO Left 06/12/2015   Procedure: CATARACT EXTRACTION PHACO AND INTRAOCULAR LENS PLACEMENT (IOC);  Surgeon: Gemma Payor, MD;  Location: [facility] ORS;  Service: Ophthalmology;  Laterality: Left;  CDE:  13.17    COLONOSCOPY     COLONOSCOPY WITH PROPOFOL N/A 01/21/2022   Procedure: COLONOSCOPY WITH PROPOFOL;  Surgeon: Dolores Frame, MD;  [facility]: [facility];  Service: Gastroenterology;  Laterality: N/A;  115 ASA 3   ERCP N/A 05/21/2021   Procedure: ENDOSCOPIC RETROGRADE CHOLANGIOPANCREATOGRAPHY (ERCP);  Surgeon: Malissa Hippo, MD;  Location: [facility]S;  Service: Gastroenterology;  Laterality: N/A;   ERCP N/A 05/21/2021   Procedure: ENDOSCOPIC RETROGRADE CHOLANGIOPANCREATOGRAPHY (ERCP);  Surgeon: Malissa Hippo, MD;  Location: [facility];  Service: Gastroenterology;  Laterality: N/A;   ESOPHAGEAL DILATION N/A 05/21/2021   Procedure: ESOPHAGEAL DILATION;  Surgeon: Malissa Hippo, MD;  Location: [facility];  Service: Gastroenterology;  Laterality: N/A;   ESOPHAGEAL DILATION  01/21/2022   Procedure: ESOPHAGEAL DILATION;  Surgeon: Dolores Frame, MD;  [facility];  Service: Gastroenterology;;   ESOPHAGOGASTRODUODENOSCOPY N/A 05/21/2021   Procedure: ESOPHAGOGASTRODUODENOSCOPY (EGD);  Surgeon: Malissa Hippo, MD;  Location: [facility];  Service: Gastroenterology;  Laterality: N/A;   ESOPHAGOGASTRODUODENOSCOPY (EGD) WITH PROPOFOL N/A 01/21/2022   Procedure: ESOPHAGOGASTRODUODENOSCOPY (EGD) WITH PROPOFOL;  Surgeon: Dolores Frame, MD;  [facility];  Service: Gastroenterology;  Laterality: N/A;   EYE SURGERY     Bilateral cataracts   INJECTION KNEE Right 02/20/2016   Procedure: KNEE INJECTION;  Surgeon: Eldred Manges, MD;  [facility];  Service: Orthopedics;  Laterality: Right;   JOINT REPLACEMENT     KNEE ARTHROSCOPY Left    LAPAROSCOPIC CHOLECYSTECTOMY     MALONEY DILATION N/A 05/21/2021   Procedure: MALONEY DILATION;  Surgeon: Malissa Hippo, MD;  Location: [facility];  Service: Gastroenterology;  Laterality: N/A;   MITRAL VALVE REPAIR  ~ 2003   MULTIPLE TOOTH EXTRACTIONS     REMOVAL OF STONES  05/21/2021   Procedure: REMOVAL OF STONES;  Surgeon: Malissa Hippo,  MD;  Location: [facility];  Service: Gastroenterology;;   SHOULDER ARTHROSCOPY WITH ROTATOR CUFF REPAIR AND OPEN BICEPS TENODESIS Right 01/26/2019   Procedure: right shoulder arthroscopy, rotator cuff repair and biceps tenodesis;  Surgeon: Eldred Manges, MD;  Location: [facility] OR;  Service: Orthopedics;  Laterality: Right;   SHOULDER OPEN ROTATOR CUFF REPAIR Left    SPHINCTEROTOMY  05/21/2021   Procedure: SPHINCTEROTOMY;  Surgeon: Malissa Hippo, MD;  Location: [facility];  Service: Gastroenterology;;   TOTAL KNEE ARTHROPLASTY Left 02/20/2016   Procedure: LEFT TOTAL KNEE ARTHROPLASTY WITH RIGHT KNEE INJECTION;  Surgeon: Eldred Manges, MD;  Location: [facility] OR;  Service: Orthopedics;  Laterality: Left;   TOTAL KNEE ARTHROPLASTY Right 07/06/2019   Procedure: RIGHT TOTAL KNEE ARTHROPLASTY;  Surgeon: Eldred Manges, MD;  Location: [facility] OR;  Service: Orthopedics;  Laterality: Right;   UPPER GI ENDOSCOPY     VASECTOMY      Family History: Family History  Problem Relation Age of Onset   Congestive Heart Failure Father    Heart disease Father    Stroke Brother    Stroke Sister    Diabetes Mellitus II Brother    Diabetes Mellitus II Sister    Lung cancer Brother    Stroke  Sister    Stroke Brother    Colon cancer Neg Hx    Esophageal cancer Neg Hx    Rectal cancer Neg Hx    Stomach cancer Neg Hx     Social History: Social History   Tobacco Use  Smoking Status Former   Packs/day: 3.00   Years: 35.00   Additional pack years: 0.00   Total pack years: 105.00   Types: Cigarettes   Quit date: 02/16/1988   Years since quitting: 34.3   Passive exposure: Past  Smokeless Tobacco Never  Tobacco Comments    Year Quit: 1990    Social History   Substance and Sexual Activity  Alcohol Use No   Social History   Substance and Sexual Activity  Drug Use Never    Allergies: Allergies  Allergen Reactions   Flomax [Tamsulosin Hcl] Other (See Comments)    Makes him "swimmy headed"    Medications: Current  Outpatient Medications  Medication Sig Dispense Refill   amLODipine (NORVASC) 2.5 MG tablet Take 1 tablet (2.5 mg total) by mouth daily. 90 tablet 3   apixaban (ELIQUIS) 5 MG TABS tablet Take 1 tablet (5 mg total) by mouth 2 (two) times daily. 180 tablet 3   Calcium Carb-Cholecalciferol (CALCIUM 600 + D PO) Take 1 tablet by mouth daily.     clobetasol cream (TEMOVATE) 0.05 % Apply 1 Application topically 2 (two) times daily as needed (Psoriasis).     Coenzyme Q10 300 MG CAPS Take 300 mg by mouth daily.     colestipol (COLESTID) 1 g tablet TAKE 4 TABLETS (4 G TOTAL) BY MOUTH DAILY. TAKE IT 4 HOURS APART FROM HIS OTHER MEDICATIONS 360 tablet 0   finasteride (PROSCAR) 5 MG tablet Take 5 mg by mouth daily.     fluticasone (CUTIVATE) 0.05 % cream Apply 1 Application topically 2 (two) times daily as needed (Psoriasis).     folic acid (FOLVITE) 1 MG tablet Take 1 mg by mouth daily.     lisinopril (ZESTRIL) 20 MG tablet TAKE 1/2 TABLET BY MOUTH EVERYDAY AT BEDTIME 90 tablet 1   metFORMIN (GLUCOPHAGE-XR) 500 MG 24 hr tablet TAKE 1 TABLET BY MOUTH EVERY DAY WITH BREAKFAST 90 tablet 3   methotrexate (RHEUMATREX) 2.5 MG tablet Take 15 mg by mouth once a week.     Multiple Vitamin (MULTIVITAMIN WITH MINERALS) TABS tablet Take 1 tablet by mouth daily.     omeprazole (PRILOSEC) 40 MG capsule Take 1 capsule (40 mg total) by mouth daily. 90 capsule 3   rosuvastatin (CRESTOR) 5 MG tablet Take 1 tablet (5 mg total) by mouth daily. For cholesterol 90 tablet 3   silodosin (RAPAFLO) 4 MG CAPS capsule Take 1 capsule (4 mg total) by mouth daily with breakfast. 90 capsule 3   triamcinolone cream (KENALOG) 0.1 % Apply 1 Application topically daily as needed (Psoriasis).     No current facility-administered medications for this visit.    Review of Systems: GENERAL: negative for malaise, night sweats HEENT: No changes in hearing or vision, no nose bleeds or other nasal problems. NECK: Negative for lumps, goiter, pain  and significant neck swelling RESPIRATORY: Negative for cough, wheezing CARDIOVASCULAR: Negative for chest pain, leg swelling, palpitations, orthopnea GI: SEE HPI MUSCULOSKELETAL: Negative for joint pain or swelling, back pain, and muscle pain. SKIN: Negative for lesions, rash PSYCH: Negative for sleep disturbance, mood disorder and recent psychosocial stressors. HEMATOLOGY Negative for prolonged bleeding, bruising easily, and swollen nodes. ENDOCRINE: Negative for cold or heat  intolerance, polyuria, polydipsia and goiter. NEURO: negative for tremor, gait imbalance, syncope and seizures. The remainder of the review of systems is noncontributory.   Physical Exam: BP 124/66 (BP Location: Left Arm, Patient Position: Sitting, Cuff Size: Normal)   Pulse (!) 46   Temp 98 F (36.7 C) (Oral)   Ht 5\' 11"  (1.803 m)   Wt 195 lb (88.5 kg)   BMI 27.20 kg/m  GENERAL: The patient is AO x3, in no acute distress. HEENT: Head is normocephalic and atraumatic. EOMI are intact. Mouth is well hydrated and without lesions. NECK: Supple. No masses LUNGS: Clear to auscultation. No presence of rhonchi/wheezing/rales. Adequate chest expansion HEART: RRR, normal s1 and s2. ABDOMEN: Soft, nontender, no guarding, no peritoneal signs, and nondistended. BS +. No masses. EXTREMITIES: Without any cyanosis, clubbing, rash, lesions or edema. NEUROLOGIC: AOx3, no focal motor deficit. SKIN: no jaundice, no rashes  Imaging/Labs: as above  I personally reviewed and interpreted the available labs, imaging and endoscopic files.  Impression and Plan: ROLIN KOCHANOWSKI is a 87 y.o. male with past medical history of atrial fibrillation, BPH, diabetes, stroke, OSA, hyperlipidemia, GERD, hypertension, who presents for follow up of chronic diarrhea and dysphagia.  Patient has had a thorough workup for evaluation of chronic diarrhea that showed nonspecific colitis but no chronic changes on biopsies.  He has had significant  improvement of his diarrhea with colestipol.  Will continue at the same dosage for now as it had led to significant improvement of his symptoms.  Has not presented any red flag signs  In terms of his dysphagia, this has slightly improved but still is present.  No red flag signs or endoscopic abnormalities that would explain.  We will schedule a barium esophagram, if abnormal will need to proceed with esophageal manometry.  Will continue with omeprazole 40 mg daily for now.  -Schedule barium esophagram with pill -Continue  colestipol 4 g every day -Continue omeprazole 40 mg daily.   All questions were answered.      Katrinka Blazing, MD Gastroenterology and Hepatology [facility] Gastroenterology

## 2022-06-23 ENCOUNTER — Other Ambulatory Visit (HOSPITAL_COMMUNITY)
Admission: RE | Admit: 2022-06-23 | Discharge: 2022-06-23 | Disposition: A | Discharge status: Home / Self Care | Payer: Medicare Other | Source: Ambulatory Visit | Attending: Dermatology | Admitting: Dermatology

## 2022-06-23 DIAGNOSIS — Z79899 Other long term (current) drug therapy: Secondary | ICD-10-CM | POA: Insufficient documentation

## 2022-06-23 LAB — COMPREHENSIVE METABOLIC PANEL
ALT: 14 U/L (ref 0–44)
AST: 17 U/L (ref 15–41)
Albumin: 4.1 g/dL (ref 3.5–5.0)
Alkaline Phosphatase: 62 U/L (ref 38–126)
Anion gap: 9 (ref 5–15)
BUN: 17 mg/dL (ref 8–23)
CO2: 26 mmol/L (ref 22–32)
Calcium: 8.6 mg/dL — ABNORMAL LOW (ref 8.9–10.3)
Chloride: 101 mmol/L (ref 98–111)
Creatinine, Ser: 0.81 mg/dL (ref 0.61–1.24)
GFR, Estimated: >60 mL/min (ref >60)
Glucose, Bld: 101 mg/dL — ABNORMAL HIGH (ref 70–99)
Potassium: 3.7 mmol/L (ref 3.5–5.1)
Sodium: 136 mmol/L (ref 135–145)
Total Bilirubin: 1.1 mg/dL (ref 0.3–1.2)
Total Protein: 7.1 g/dL (ref 6.5–8.1)

## 2022-06-23 LAB — CBC WITH DIFFERENTIAL/PLATELET
Abs Immature Granulocytes: 0.02 10*3/uL (ref 0.00–0.07)
Basophils Absolute: 0 10*3/uL (ref 0.0–0.1)
Basophils Relative: 0 %
Eosinophils Absolute: 0.1 10*3/uL (ref 0.0–0.5)
Eosinophils Relative: 2 %
HCT: 38.3 % — ABNORMAL LOW (ref 39.0–52.0)
Hemoglobin: 13.2 g/dL (ref 13.0–17.0)
Immature Granulocytes: 0 %
Lymphocytes Relative: 20 %
Lymphs Abs: 1 10*3/uL (ref 0.7–4.0)
MCH: 33.7 pg (ref 26.0–34.0)
MCHC: 34.5 g/dL (ref 30.0–36.0)
MCV: 97.7 fL (ref 80.0–100.0)
Monocytes Absolute: 0.6 10*3/uL (ref 0.1–1.0)
Monocytes Relative: 11 %
Neutro Abs: 3.5 10*3/uL (ref 1.7–7.7)
Neutrophils Relative %: 67 %
Platelets: 212 10*3/uL (ref 150–400)
RBC: 3.92 MIL/uL — ABNORMAL LOW (ref 4.22–5.81)
RDW: 13.5 % (ref 11.5–15.5)
WBC: 5.3 10*3/uL (ref 4.0–10.5)
nRBC: 0 % (ref 0.0–0.2)

## 2022-06-23 LAB — LIPID PANEL
Cholesterol: 98 mg/dL (ref 0–200)
HDL: 52 mg/dL (ref >40)
LDL Cholesterol: 33 mg/dL (ref 0–99)
Total CHOL/HDL Ratio: 1.9 RATIO
Triglycerides: 67 mg/dL (ref <150)
VLDL: 13 mg/dL (ref 0–40)

## 2022-06-28 ENCOUNTER — Other Ambulatory Visit (HOSPITAL_COMMUNITY): Payer: Medicare Other

## 2022-07-01 DIAGNOSIS — L400 Psoriasis vulgaris: Secondary | ICD-10-CM | POA: Diagnosis not present

## 2022-07-06 DIAGNOSIS — G4733 Obstructive sleep apnea (adult) (pediatric): Secondary | ICD-10-CM | POA: Diagnosis not present

## 2022-07-08 DIAGNOSIS — R310 Gross hematuria: Secondary | ICD-10-CM | POA: Diagnosis not present

## 2022-07-08 DIAGNOSIS — N281 Cyst of kidney, acquired: Secondary | ICD-10-CM | POA: Diagnosis not present

## 2022-07-22 ENCOUNTER — Ambulatory Visit (INDEPENDENT_AMBULATORY_CARE_PROVIDER_SITE_OTHER): Payer: Medicare Other | Admitting: Family Medicine

## 2022-07-22 ENCOUNTER — Telehealth: Payer: Self-pay | Admitting: Cardiology

## 2022-07-22 ENCOUNTER — Encounter: Payer: Self-pay | Admitting: Family Medicine

## 2022-07-22 VITALS — BP 116/57 | HR 52 | Temp 97.5°F | Ht 71.0 in | Wt 193.4 lb

## 2022-07-22 DIAGNOSIS — R55 Syncope and collapse: Secondary | ICD-10-CM

## 2022-07-22 DIAGNOSIS — E1140 Type 2 diabetes mellitus with diabetic neuropathy, unspecified: Secondary | ICD-10-CM | POA: Diagnosis not present

## 2022-07-22 DIAGNOSIS — Z7984 Long term (current) use of oral hypoglycemic drugs: Secondary | ICD-10-CM | POA: Diagnosis not present

## 2022-07-22 DIAGNOSIS — I482 Chronic atrial fibrillation, unspecified: Secondary | ICD-10-CM

## 2022-07-22 LAB — BAYER DCA HB A1C WAIVED: HB A1C (BAYER DCA - WAIVED): 5.6 % (ref 4.8–5.6)

## 2022-07-22 MED ORDER — BLOOD GLUCOSE MONITORING SUPPL DEVI: 1.0000 | Freq: Three times a day (TID) | 0 refills | Status: AC

## 2022-07-22 MED ORDER — BLOOD GLUCOSE TEST VI STRP
1.0000 | ORAL_STRIP | Freq: Three times a day (TID) | 99 refills | Status: DC
Start: 2022-07-22 — End: 2023-08-22

## 2022-07-22 MED ORDER — LANCET DEVICE MISC: 1.0000 | Freq: Three times a day (TID) | 0 refills | Status: AC

## 2022-07-22 MED ORDER — LANCETS MISC. MISC: 1.0000 | Freq: Three times a day (TID) | 0 refills | Status: AC

## 2022-07-22 NOTE — Telephone Encounter (Signed)
Patient stated he has had 3 episodes of syncope. His most recent episodes was last Thursday. Each time he had an episode was right after taking his blood pressure medication.   He saw PCP today and he took him off of amlodipine until he see cardiologist. He denies any chest pain, shortness of breath, headache, dizziness, nausea or vomiting. He states he feels just fine as of right now.  I do have him scheduled for 6/20 with you. Would you like to see him sooner.

## 2022-07-22 NOTE — Progress Notes (Signed)
Subjective:  Patient ID: [name],  male    DOB: May 01, 1935  Age: 87 y.o.    CC: Medical Management of Chronic Issues   HPI [name] presents for  follow-up of hypertension. Patient has no history of headache chest pain or shortness of breath or recent cough. Patient also denies symptoms of TIA such as numbness weakness lateralizing. Patient denies side effects from medication. States taking it regularly.  3 blackout spells. Each was midmorning sitting, drinking coffee. Next thing he knows his lap has coffee all over and his cup is on the floor. Not sure how long he was out.   BP 97/57 with HR 40s. Multiple readings with SBP in the 90s and pulse in the 40s.   Patient also  in for follow-up of elevated cholesterol. Doing well without complaints on current medication. Denies side effects  including myalgia and arthralgia and nausea. Also in today for liver function testing. Currently no chest pain, shortness of breath or other cardiovascular related symptoms noted.  Follow-up of diabetes. Patient does not check blood sugar at home. Patient denies symptoms such as excessive hunger or urinary frequency, excessive hunger, nausea No significant hypoglycemic spells noted. Medications reviewed. Pt reports taking them regularly. Pt. denies complication/adverse reaction today.    History [name] has a past medical history of Arthritis, Atrial fibrillation (HCC), BPH (benign prostatic hyperplasia), Cataracts, bilateral, Coronary artery disease excluded, Diabetes mellitus without complication (HCC), Diverticulosis, Endocarditis, valve unspecified, unspecified cause, GERD (gastroesophageal reflux disease), H/O mitral valve repair, Hearing loss, Heart murmur, Hiatal hernia (06/13/2002), High cholesterol, History of kidney stones, OSA on CPAP, Stroke (HCC), Unspecified essential hypertension, Weakness of both arms (07/19/2012), and Wears dentures.   He has a past surgical history that  includes Vasectomy; Mitral valve repair (~ 2003); Cataract extraction w/PHACO (Right, 05/12/2015); Cataract extraction w/PHACO (Left, 06/12/2015); Total knee arthroplasty (Left, 02/20/2016); Injection knee (Right, 02/20/2016); Cardioversion (N/A, 04/14/2016); Laparoscopic cholecystectomy; Knee arthroscopy (Left); Joint replacement; Shoulder open rotator cuff repair (Left); Colonoscopy; Upper gi endoscopy; Multiple tooth extractions; Cardiac catheterization (02/14/2007); Shoulder arthroscopy with rotator cuff repair and open biceps tenodesis (Right, 01/26/2019); Eye surgery; Total knee arthroplasty (Right, 07/06/2019); Esophagogastroduodenoscopy (N/A, 05/21/2021); ERCP (N/A, 05/21/2021); maloney dilation (N/A, 05/21/2021); sphincterotomy (05/21/2021); removal of stones (05/21/2021); Esophageal dilation (N/A, 05/21/2021); ERCP (N/A, 05/21/2021); Colonoscopy with propofol (N/A, 01/21/2022); Esophagogastroduodenoscopy (egd) with propofol (N/A, 01/21/2022); Esophageal dilation (01/21/2022); and biopsy (01/21/2022).   His family history includes Congestive Heart Failure in his father; Diabetes Mellitus II in his brother and sister; Heart disease in his father; Lung cancer in his brother; Stroke in his brother, brother, sister, and sister.He reports that he quit smoking about 34 years ago. His smoking use included cigarettes. He has a 105.00 pack-year smoking history. He has been exposed to tobacco smoke. He has never used smokeless tobacco. He reports that he does not drink alcohol and does not use drugs.  Current Outpatient Medications on File Prior to Visit  Medication Sig Dispense Refill   apixaban (ELIQUIS) 5 MG TABS tablet Take 1 tablet (5 mg total) by mouth 2 (two) times daily. 180 tablet 3   Calcium Carb-Cholecalciferol (CALCIUM 600 + D PO) Take 1 tablet by mouth daily.     clobetasol cream (TEMOVATE) 0.05 % Apply 1 Application topically 2 (two) times daily as needed (Psoriasis).     Coenzyme Q10 300 MG CAPS Take 300 mg by mouth  daily.     colestipol (COLESTID) 1 g tablet Take 4 tablets (4 g  total) by mouth daily. take it 4 hours apart from his other medications 360 tablet 3   finasteride (PROSCAR) 5 MG tablet Take 5 mg by mouth daily.     fluticasone (CUTIVATE) 0.05 % cream Apply 1 Application topically 2 (two) times daily as needed (Psoriasis).     folic acid (FOLVITE) 1 MG tablet Take 1 mg by mouth daily.     lisinopril (ZESTRIL) 20 MG tablet TAKE 1/2 TABLET BY MOUTH EVERYDAY AT BEDTIME 90 tablet 1   metFORMIN (GLUCOPHAGE-XR) 500 MG 24 hr tablet TAKE 1 TABLET BY MOUTH EVERY DAY WITH BREAKFAST 90 tablet 3   methotrexate (RHEUMATREX) 2.5 MG tablet Take 15 mg by mouth once a week.     Multiple Vitamin (MULTIVITAMIN WITH MINERALS) TABS tablet Take 1 tablet by mouth daily.     omeprazole (PRILOSEC) 40 MG capsule Take 1 capsule (40 mg total) by mouth daily. 90 capsule 3   rosuvastatin (CRESTOR) 5 MG tablet Take 1 tablet (5 mg total) by mouth daily. For cholesterol 90 tablet 3   silodosin (RAPAFLO) 4 MG CAPS capsule Take 1 capsule (4 mg total) by mouth daily with breakfast. 90 capsule 3   triamcinolone cream (KENALOG) 0.1 % Apply 1 Application topically daily as needed (Psoriasis).     No current facility-administered medications on file prior to visit.    ROS Review of Systems  Constitutional:  Negative for fever.  Respiratory:  Negative for shortness of breath.   Cardiovascular:  Negative for chest pain.  Musculoskeletal:  Negative for arthralgias.  Skin:  Negative for rash.  Neurological:  Positive for syncope. Negative for dizziness and headaches.    Objective:  BP (!) 116/57   Pulse (!) 52   Temp (!) 97.5 F (36.4 C)   Ht 5\' 11"  (1.803 m)   Wt 193 lb 6.4 oz (87.7 kg)   SpO2 98%   BMI 26.97 kg/m   BP Readings from Last 3 Encounters:  07/22/22 (!) 116/57  06/21/22 124/66  04/19/22 112/60    Wt Readings from Last 3 Encounters:  07/22/22 193 lb 6.4 oz (87.7 kg)  06/21/22 195 lb (88.5 kg)  04/19/22  193 lb 9.6 oz (87.8 kg)     Physical Exam Vitals reviewed.  Constitutional:      Appearance: He is well-developed.  HENT:     Head: Normocephalic and atraumatic.     Right Ear: External ear normal.     Left Ear: External ear normal.     Mouth/Throat:     Pharynx: No oropharyngeal exudate or posterior oropharyngeal erythema.  Eyes:     Pupils: Pupils are equal, round, and reactive to light.  Cardiovascular:     Rate and Rhythm: Normal rate and regular rhythm.     Heart sounds: No murmur heard. Pulmonary:     Effort: No respiratory distress.     Breath sounds: Normal breath sounds.  Musculoskeletal:     Cervical back: Normal range of motion and neck supple.  Neurological:     Mental Status: He is alert and oriented to person, place, and time.     Diabetic Foot Exam - Simple   No data filed     Lab Results  Component Value Date   HGBA1C 6.1 (H) 04/19/2022   HGBA1C 5.6 10/20/2021   HGBA1C 5.9 (H) 07/15/2021    Assessment & Plan:   [name] was seen today for medical management of chronic issues.  Diagnoses and all orders for this visit:  Type  2 diabetes mellitus with diabetic neuropathy, unspecified whether long term insulin use (HCC) -     Bayer DCA Hb A1c Waived  Syncope, unspecified syncope type -     Ambulatory referral to Cardiology -     EKG 12-Lead  Chronic atrial fibrillation (HCC) -     Ambulatory referral to Cardiology  Other orders -     Blood Glucose Monitoring Suppl DEVI; 1 each by Does not apply route in the morning, at noon, and at bedtime. May substitute to any manufacturer covered by patient's insurance. -     Glucose Blood (BLOOD GLUCOSE TEST STRIPS) STRP; 1 each by In Vitro route in the morning, at noon, and at bedtime. May substitute to any manufacturer covered by patient's insurance. -     Lancet Device MISC; 1 each by Does not apply route in the morning, at noon, and at bedtime. May substitute to any manufacturer covered by patient's  insurance. -     Lancets Misc. MISC; 1 each by Does not apply route in the morning, at noon, and at bedtime. May substitute to any manufacturer covered by patient's insurance.   I have discontinued [name]'s amLODipine. I am also having him start on Blood Glucose Monitoring Suppl, BLOOD GLUCOSE TEST STRIPS, Lancet Device, and Lancets Misc.. Additionally, I am having him maintain his multivitamin with minerals, Calcium Carb-Cholecalciferol (CALCIUM 600 + D PO), Coenzyme Q10, silodosin, methotrexate, folic acid, clobetasol cream, fluticasone, triamcinolone cream, apixaban, lisinopril, metFORMIN, rosuvastatin, finasteride, colestipol, and omeprazole.  Meds ordered this encounter  Medications   Blood Glucose Monitoring Suppl DEVI    Sig: 1 each by Does not apply route in the morning, at noon, and at bedtime. May substitute to any manufacturer covered by patient's insurance.    Dispense:  1 each    Refill:  0   Glucose Blood (BLOOD GLUCOSE TEST STRIPS) STRP    Sig: 1 each by In Vitro route in the morning, at noon, and at bedtime. May substitute to any manufacturer covered by patient's insurance.    Dispense:  100 strip    Refill:  PRN   Lancet Device MISC    Sig: 1 each by Does not apply route in the morning, at noon, and at bedtime. May substitute to any manufacturer covered by patient's insurance.    Dispense:  1 each    Refill:  0   Lancets Misc. MISC    Sig: 1 each by Does not apply route in the morning, at noon, and at bedtime. May substitute to any manufacturer covered by patient's insurance.    Dispense:  100 each    Refill:  0     Follow-up: Return in about 3 months (around 10/22/2022).  Mechele Claude, M.D.

## 2022-07-22 NOTE — Telephone Encounter (Signed)
Pt c/o Syncope: STAT if syncope occurred within 30 minutes and pt complains of lightheadedness High Priority if episode of passing out, completely, today or in last 24 hours   Did you pass out today? No    When is the last time you passed out? Last week    Has this occurred multiple times? Yes, has occurred 3x   Did you have any symptoms prior to passing out? No     All 3 episodes were early in the morning after taking BP med.  PCP made medication adjustments due to this and advised the pt to f/u with Cardiology.   Please advise.

## 2022-07-23 ENCOUNTER — Telehealth: Payer: Self-pay | Admitting: Cardiology

## 2022-07-23 NOTE — Telephone Encounter (Signed)
New Message:    Wife wants to know that since patient have been having problems, should he go out of town on this Sunday(07-25-22)? Please let her know today if possible.

## 2022-07-23 NOTE — Telephone Encounter (Signed)
Spoke with pt wife, offered DOD appointment Monday but she reports they will be gone until Thursday. Follow up scheduled for patient to see Dr Wyline Mood Thursday afternoon.

## 2022-07-23 NOTE — Telephone Encounter (Signed)
Advised that without seeing the physician we could not tell her yes or no. It would be concerning given what she is saying.  Advised her to speak to his PCP since he saw patient yesterday.  She states PCP is out of office today. She did not ask yesterday from what she states.  PCP did make changes to BP med and advised to check BG daily and sent RX for supplies.   Again advised to speak with PCP and to follow up next week at appt with our office.  She states understanding

## 2022-07-29 ENCOUNTER — Ambulatory Visit: Payer: Medicare Other | Attending: Internal Medicine | Admitting: Internal Medicine

## 2022-07-29 ENCOUNTER — Encounter: Payer: Self-pay | Admitting: Internal Medicine

## 2022-07-29 VITALS — BP 118/66 | HR 41 | Ht 71.0 in | Wt 194.2 lb

## 2022-07-29 DIAGNOSIS — R55 Syncope and collapse: Secondary | ICD-10-CM | POA: Diagnosis not present

## 2022-07-29 NOTE — Progress Notes (Signed)
Cardiology Office Note:    Date:  07/29/2022   ID:  [name], DOB [birth year], MRN [id number]  PCP:  Mechele Claude, MD   [facility] Providers Cardiologist:  Rollene Rotunda, MD     Referring MD: Mechele Claude, MD   No chief complaint on file. Syncope  History of Present Illness:    [name] is a 87 y.o. male with a hx of history of OSA,  mitral valve repair as well as atrial fibrillation. He had undergone DC cardioversion, but subsequently, he developed recurrent AF. Due to concerns for sleep apnea he was referred for a sleep study and was found. to have severe obstructive sleep apnea. Followed by Dr. Antoine Poche and has an appt with him next week. He is here for an acute visit and earlier assessment   He's here for an acute visit for syncope. He notes he's had syncopal episodes x3 that are sporadic and occurred about a month or so apart. He states he is typically holding coffee and suddenly looses consciousness. He talked with his PCP and was recommended to stop norvasc. He notes feeling LH.  He had a holter in 2018 that showed slow afib while sleeping. Echo 10/21/2020 showed normal LV function, elevated LVEDP and pulm HTN RVSP 47 mmhg. No significant valve dx. BP 118/66 mmHg. EKG shows slow Afib.  Past Medical History:  Diagnosis Date   Arthritis    knees, shoulder,  hips (10/24/2017)   Atrial fibrillation (HCC)    on eliquis   BPH (benign prostatic hyperplasia)    Cataracts, bilateral    removed by surgery   Coronary artery disease excluded    Diabetes mellitus without complication (HCC)    type 2   Diverticulosis    Endocarditis, valve unspecified, unspecified cause    GERD (gastroesophageal reflux disease)    H/O mitral valve repair    Postoperative ring with postoperative atrial fibrillation   Hearing loss    both ears - does not use hearing aids   Heart murmur    hx   Hiatal hernia 06/13/2002   High cholesterol    History of kidney stones     passed spontaneously x2    OSA on CPAP    uses cpap nightly   Stroke [facility])    Unspecified essential hypertension    Weakness of both arms 07/19/2012   Wears dentures    upper and lower partial    Past Surgical History:  Procedure Laterality Date   BIOPSY  01/21/2022   Procedure: BIOPSY;  Surgeon: Dolores Frame, MD;  [facility];  Service: Gastroenterology;;   CARDIAC CATHETERIZATION  02/14/2007   x 2   CARDIOVERSION N/A 04/14/2016   Procedure: CARDIOVERSION;  Surgeon: Rollene Rotunda, MD;  Location: [facility] ENDOSCOPY;  Service: Cardiovascular;  Laterality: N/A;   CATARACT EXTRACTION W/PHACO Right 05/12/2015   Procedure: CATARACT EXTRACTION PHACO AND INTRAOCULAR LENS PLACEMENT RIGHT EYE CDE=7.75;  Surgeon: Gemma Payor, MD;  Location: [facility] ORS;  Service: Ophthalmology;  Laterality: Right;   CATARACT EXTRACTION W/PHACO Left 06/12/2015   Procedure: CATARACT EXTRACTION PHACO AND INTRAOCULAR LENS PLACEMENT (IOC);  Surgeon: Gemma Payor, MD;  Location: [facility] ORS;  Service: Ophthalmology;  Laterality: Left;  CDE:  13.17   COLONOSCOPY     COLONOSCOPY WITH PROPOFOL N/A 01/21/2022   Procedure: COLONOSCOPY WITH PROPOFOL;  Surgeon: Dolores Frame, MD;  [facility];  Service: Gastroenterology;  Laterality: N/A;  115 ASA 3   ERCP N/A  05/21/2021   Procedure: ENDOSCOPIC RETROGRADE CHOLANGIOPANCREATOGRAPHY (ERCP);  Surgeon: Malissa Hippo, MD;  Location: [facility];  Service: Gastroenterology;  Laterality: N/A;   ERCP N/A 05/21/2021   Procedure: ENDOSCOPIC RETROGRADE CHOLANGIOPANCREATOGRAPHY (ERCP);  Surgeon: Malissa Hippo, MD;  Location: [facility];  Service: Gastroenterology;  Laterality: N/A;   ESOPHAGEAL DILATION N/A 05/21/2021   Procedure: ESOPHAGEAL DILATION;  Surgeon: Malissa Hippo, MD;  Location: [facility];  Service: Gastroenterology;  Laterality: N/A;   ESOPHAGEAL DILATION  01/21/2022   Procedure: ESOPHAGEAL DILATION;  Surgeon: Dolores Frame, MD;  [facility];  Service: Gastroenterology;;   ESOPHAGOGASTRODUODENOSCOPY N/A 05/21/2021   Procedure: ESOPHAGOGASTRODUODENOSCOPY (EGD);  Surgeon: Malissa Hippo, MD;  [facility]: [facility];  Service: Gastroenterology;  Laterality: N/A;   ESOPHAGOGASTRODUODENOSCOPY (EGD) WITH PROPOFOL N/A 01/21/2022   Procedure: ESOPHAGOGASTRODUODENOSCOPY (EGD) WITH PROPOFOL;  Surgeon: Dolores Frame, MD;  [facility];  Service: Gastroenterology;  Laterality: N/A;   EYE SURGERY     Bilateral cataracts   INJECTION KNEE Right 02/20/2016   Procedure: KNEE INJECTION;  Surgeon: Eldred Manges, MD;  [facility] OR;  Service: Orthopedics;  Laterality: Right;   JOINT REPLACEMENT     KNEE ARTHROSCOPY Left    LAPAROSCOPIC CHOLECYSTECTOMY     MALONEY DILATION N/A 05/21/2021   Procedure: MALONEY DILATION;  Surgeon: Malissa Hippo, MD;  Location: [facility];  Service: Gastroenterology;  Laterality: N/A;   MITRAL VALVE REPAIR  ~ 2003   MULTIPLE TOOTH EXTRACTIONS     REMOVAL OF STONES  05/21/2021   Procedure: REMOVAL OF STONES;  Surgeon: Malissa Hippo, MD;  Location: [facility];  Service: Gastroenterology;;   SHOULDER ARTHROSCOPY WITH ROTATOR CUFF REPAIR AND OPEN BICEPS TENODESIS Right 01/26/2019   Procedure: right shoulder arthroscopy, rotator cuff repair and biceps tenodesis;  Surgeon: Eldred Manges, MD;  Location: [facility] OR;  Service: Orthopedics;  Laterality: Right;   SHOULDER OPEN ROTATOR CUFF REPAIR Left    SPHINCTEROTOMY  05/21/2021   Procedure: SPHINCTEROTOMY;  Surgeon: Malissa Hippo, MD;  Location: [facility];  Service: Gastroenterology;;   TOTAL KNEE ARTHROPLASTY Left 02/20/2016   Procedure: LEFT TOTAL KNEE ARTHROPLASTY WITH RIGHT KNEE INJECTION;  Surgeon: Eldred Manges, MD;  Location: [facility] OR;  Service: Orthopedics;  Laterality: Left;   TOTAL KNEE ARTHROPLASTY Right 07/06/2019   Procedure: RIGHT TOTAL KNEE ARTHROPLASTY;  Surgeon: Eldred Manges, MD;  Location: [facility] OR;  Service: Orthopedics;  Laterality: Right;   UPPER GI  ENDOSCOPY     VASECTOMY      Current Medications: Current Meds  Medication Sig   apixaban (ELIQUIS) 5 MG TABS tablet Take 1 tablet (5 mg total) by mouth 2 (two) times daily.   Blood Glucose Monitoring Suppl DEVI 1 each by Does not apply route in the morning, at noon, and at bedtime. May substitute to any manufacturer covered by patient's insurance.   Calcium Carb-Cholecalciferol (CALCIUM 600 + D PO) Take 1 tablet by mouth daily.   clobetasol cream (TEMOVATE) 0.05 % Apply 1 Application topically 2 (two) times daily as needed (Psoriasis).   Coenzyme Q10 300 MG CAPS Take 300 mg by mouth daily.   colestipol (COLESTID) 1 g tablet Take 4 tablets (4 g total) by mouth daily. take it 4 hours apart from his other medications   finasteride (PROSCAR) 5 MG tablet Take 5 mg by mouth daily.   fluticasone (CUTIVATE) 0.05 % cream Apply 1 Application topically 2 (two) times daily as needed (Psoriasis).  folic acid (FOLVITE) 1 MG tablet Take 1 mg by mouth daily.   Glucose Blood (BLOOD GLUCOSE TEST STRIPS) STRP 1 each by In Vitro route in the morning, at noon, and at bedtime. May substitute to any manufacturer covered by patient's insurance.   Lancet Device MISC 1 each by Does not apply route in the morning, at noon, and at bedtime. May substitute to any manufacturer covered by patient's insurance.   Lancets Misc. MISC 1 each by Does not apply route in the morning, at noon, and at bedtime. May substitute to any manufacturer covered by patient's insurance.   lisinopril (ZESTRIL) 20 MG tablet TAKE 1/2 TABLET BY MOUTH EVERYDAY AT BEDTIME   metFORMIN (GLUCOPHAGE-XR) 500 MG 24 hr tablet TAKE 1 TABLET BY MOUTH EVERY DAY WITH BREAKFAST   methotrexate (RHEUMATREX) 2.5 MG tablet Take 15 mg by mouth once a week.   Multiple Vitamin (MULTIVITAMIN WITH MINERALS) TABS tablet Take 1 tablet by mouth daily.   omeprazole (PRILOSEC) 40 MG capsule Take 1 capsule (40 mg total) by mouth daily.   rosuvastatin (CRESTOR) 5 MG tablet  Take 1 tablet (5 mg total) by mouth daily. For cholesterol   silodosin (RAPAFLO) 4 MG CAPS capsule Take 1 capsule (4 mg total) by mouth daily with breakfast.   triamcinolone cream (KENALOG) 0.1 % Apply 1 Application topically daily as needed (Psoriasis).     Allergies:   Flomax [tamsulosin hcl]   Social History   Socioeconomic History   Marital status: Married    Spouse name: GLORIA   Number of children: 2   Years of education: 12   Highest education level: Professional school degree (e.g., MD, DDS, DVM, JD)  Occupational History   Occupation:  Neurosurgeon    Comment: RETIRED  Tobacco Use   Smoking status: Former    Packs/day: 3.00    Years: 35.00    Additional pack years: 0.00    Total pack years: 105.00    Types: Cigarettes    Quit date: 02/16/1988    Years since quitting: 34.4    Passive exposure: Past   Smokeless tobacco: Never   Tobacco comments:     Year Quit: 1990   Vaping Use   Vaping Use: Never used  Substance and Sexual Activity   Alcohol use: No   Drug use: Never   Sexual activity: Not Currently  Other Topics Concern   Not on file  Social History Narrative   Not on file   Social Determinants of Health   Financial Resource Strain: Low Risk  (07/18/2022)   Overall Financial Resource Strain (CARDIA)    Difficulty of Paying Living Expenses: Not hard at all  Food Insecurity: No Food Insecurity (07/18/2022)   Hunger Vital Sign    Worried About Running Out of Food in the Last Year: Never true    Ran Out of Food in the Last Year: Never true  Transportation Needs: No Transportation Needs (07/18/2022)   PRAPARE - Administrator, Civil Service (Medical): No    Lack of Transportation (Non-Medical): No  Physical Activity: Sufficiently Active (07/18/2022)   Exercise Vital Sign    Days of Exercise per Week: 3 days    Minutes of Exercise per Session: 60 min  Stress: No Stress Concern Present (07/18/2022)   [employer] of Occupational Health -  Occupational Stress Questionnaire    Feeling of Stress : Not at all  Social Connections: Socially Integrated (07/18/2022)   Social Connection and Isolation Panel [NHANES]  Frequency of Communication with Friends and Family: More than three times a week    Frequency of Social Gatherings with Friends and Family: Twice a week    Attends Religious Services: More than 4 times per year    Active Member of [employer] or Organizations: Yes    Attends Engineer, structural: More than 4 times per year    Marital Status: Married     Family History: The patient's family history includes Congestive Heart Failure in his father; Diabetes Mellitus II in his brother and sister; Heart disease in his father; Lung cancer in his brother; Stroke in his brother, brother, sister, and sister. There is no history of Colon cancer, Esophageal cancer, Rectal cancer, or Stomach cancer.  ROS:   Please see the history of present illness.     All other systems reviewed and are negative.  EKGs/Labs/Other Studies Reviewed:    The following studies were reviewed today:   EKG:  EKG is  ordered today.  The ekg ordered today demonstrates   07/29/2022- slow afib HR 41 bpm  Recent Labs: 08/17/2021: TSH 2.04 06/23/2022: ALT 14; BUN 17; Creatinine, Ser 0.81; Hemoglobin 13.2; Platelets 212; Potassium 3.7; Sodium 136   Recent Lipid Panel    Component Value Date/Time   CHOL 98 06/23/2022 1436   CHOL 92 (L) 10/20/2021 1050   TRIG 67 06/23/2022 1436   HDL 52 06/23/2022 1436   HDL 54 10/20/2021 1050   CHOLHDL 1.9 06/23/2022 1436   VLDL 13 06/23/2022 1436   LDLCALC 33 06/23/2022 1436   LDLCALC 24 10/20/2021 1050     Risk Assessment/Calculations:    CHA2DS2-VASc Score = 6   This indicates a 9.7% annual risk of stroke. The patient's score is based upon: CHF History: 0 HTN History: 1 Diabetes History: 1 Stroke History: 2 Vascular Disease History: 0 Age Score: 2 Gender Score: 0          Physical Exam:     VS:  BP 118/66 (BP Location: Left Arm, Patient Position: Sitting, Cuff Size: Normal)   Pulse (!) 41   Ht 5\' 11"  (1.803 m)   Wt 194 lb 3.2 oz (88.1 kg)   SpO2 100%   BMI 27.09 kg/m     Wt Readings from Last 3 Encounters:  07/29/22 194 lb 3.2 oz (88.1 kg)  07/22/22 193 lb 6.4 oz (87.7 kg)  06/21/22 195 lb (88.5 kg)     GEN:  Well nourished, well developed in no acute distress HEENT: Normal NECK: No JVD LYMPHATICS: No lymphadenopathy CARDIAC: bradycardic/irregular, no murmurs, rubs, gallops RESPIRATORY:  Clear to auscultation without rales, wheezing or rhonchi  ABDOMEN: Soft, non-tender, non-distended MUSCULOSKELETAL:  No edema; No deformity  SKIN: Warm and dry NEUROLOGIC:  Alert and oriented x 3 PSYCHIATRIC:  Normal affect   ASSESSMENT:    Syncope: noted slow afib since 2018. No significant symptoms until recently. Has had longstanding persistent Afib. Will get a live cardiac monitor to assess if he has AV conduction block or pauses. May be high risk for DCCV with slow rates. Will get a  limited TTE. Will forward monitor results to Dr. Antoine Poche. Discussed with Mr. Lipe that he may meet criteria for a pacemaker.   HTN: normal Bps. agree with stopping norvasc. Lisinipril 10 mg daily at bedtime.  PLAN:    In order of problems listed above:  30 day preventice Limited TTE FU with Dr. Antoine Poche next week   Medication Adjustments/Labs and Tests Ordered: Current medicines are reviewed  at length with the patient today.  Concerns regarding medicines are outlined above.  Orders Placed This Encounter  Procedures   CARDIAC EVENT MONITOR   EKG 12-Lead   ECHOCARDIOGRAM LIMITED   No orders of the defined types were placed in this encounter.   Patient Instructions  Medication Instructions:  *If you need a refill on your cardiac medications before your next appointment, please call your pharmacy*   Lab Work: If you have labs (blood work) drawn today and your tests are  completely normal, you will receive your results only by: MyChart Message (if you have MyChart) OR A paper copy in the mail If you have any lab test that is abnormal or we need to change your treatment, we will call you to review the results.   Testing/Procedures:  [employer] Cardiac Event Monitor Instructions  Your physician has requested you wear your cardiac event monitor for ___30__ days, (1-30). [employer] may call or text to confirm a shipping address. The monitor will be sent to a land address via UPS. [employer] will not ship a monitor to a PO BOX. It typically takes 3-5 days to receive your monitor after it has been enrolled. [employer] will assist with USPS tracking if your package is delayed. The telephone number for [employer] is [phone].  Once you have received your monitor, please review the enclosed instructions. Instruction tutorials can also be viewed under help and settings on the enclosed cell phone. Your monitor has already been registered assigning a specific monitor serial # to you.  Billing and Self Pay Discount Information  [employer] has been provided the insurance information we had on file for you.  If your insurance has been updated, please call [employer] at [phone] to provide them with your updated insurance information.   [employer] offers a discounted Self Pay option for patients who have insurance that does not cover their cardiac event monitor or patients without insurance.  The discounted cost of a Self Pay Cardiac Event Monitor would be $225.00 , if the patient contacts [employer] at [phone] within 7 days of applying the monitor to make payment arrangements.  If the patient does not contact [employer] within 7 days of applying the monitor, the cost of the cardiac event monitor will be $350.00.  Applying the monitor  Remove cell phone from case and turn it on. The cell phone works as IT consultant and needs to be within [employer] of you  at all times. The cell phone will need to be charged on a daily basis. We recommend you plug the cell phone into the enclosed charger at your bedside table every night.  Monitor batteries: You will receive two monitor batteries labelled #1 and #2. These are your recorders. Plug battery #2 onto the second connection on the enclosed charger. Keep one battery on the charger at all times. This will keep the monitor battery deactivated. It will also keep it fully charged for when you need to switch your monitor batteries. A small light will be blinking on the battery emblem when it is charging. The light on the battery emblem will remain on when the battery is fully charged.  Open package of a Monitor strip. Insert battery #1 into black hood on strip and gently squeeze monitor battery onto connection as indicated in instruction booklet. Set aside while preparing skin.  Choose location for your strip, vertical or horizontal, as indicated in the instruction booklet. Shave to remove all hair from location. There cannot be any lotions, oils, powders,  or colognes on skin where monitor is to be applied. Wipe skin clean with enclosed Saline wipe. Dry skin completely.  Peel paper labeled #1 off the back of the Monitor strip exposing the adhesive. Place the monitor on the chest in the vertical or horizontal position shown in the instruction booklet. One arrow on the monitor strip must be pointing upward. Carefully remove paper labeled #2, attaching remainder of strip to your skin. Try not to create any folds or wrinkles in the strip as you apply it.  Firmly press and release the circle in the center of the monitor battery. You will hear a small beep. This is turning the monitor battery on. The heart emblem on the monitor battery will light up every 5 seconds if the monitor battery in turned on and connected to the patient securely. Do not push and hold the circle down as this turns the monitor battery off.  The cell phone will locate the monitor battery. A screen will appear on the cell phone checking the connection of your monitor strip. This may read poor connection initially but change to good connection within the next minute. Once your monitor accepts the connection you will hear a series of 3 beeps followed by a climbing crescendo of beeps. A screen will appear on the cell phone showing the two monitor strip placement options. Touch the picture that demonstrates where you applied the monitor strip.  Your monitor strip and battery are waterproof. You are able to shower, bathe, or swim with the monitor on. They just ask you do not submerge deeper than 3 feet underwater. We recommend removing the monitor if you are swimming in a lake, river, or ocean.  Your monitor battery will need to be switched to a fully charged monitor battery approximately once a week. The cell phone will alert you of an action which needs to be made.  On the cell phone, tap for details to reveal connection status, monitor battery status, and cell phone battery status. The green dots indicates your monitor is in good status. A red dot indicates there is something that needs your attention.  To record a symptom, click the circle on the monitor battery. In 30-60 seconds a list of symptoms will appear on the cell phone. Select your symptom and tap save. Your monitor will record a sustained or significant arrhythmia regardless of you clicking the button. Some patients do not feel the heart rhythm irregularities. [employer] will notify us of any serious or critical events.  Refer to instruction booklet for instructions on switching batteries, changing strips, the Do not disturb or Pause features, or any additional questions.  Call [employer] at [phone], to confirm your monitor is transmitting and record your baseline. They will answer any questions you may have regarding the monitor instructions at that  time.  Returning the monitor to [employer]  Place all equipment back into blue box. Peel off strip of paper to expose adhesive and close box securely. There is a prepaid UPS shipping label on this box. Drop in a UPS drop box, or at a UPS facility like [employer]. You may also contact [employer] to arrange UPS to pick up monitor package at your home.  Your physician has requested that you have an echocardiogram. Echocardiography is a painless test that uses sound waves to create images of your heart. It provides your doctor with information about the size and shape of your heart and how well your heart's chambers and valves are working. This procedure takes  approximately one hour. There are no restrictions for this procedure. Please do NOT wear cologne, aftershave, or lotions (deodorant is allowed).  Please arrive 15 minutes prior to your appointment time.    Follow-Up: At [facility], you and your health needs are our priority.  As part of our continuing mission to provide you with exceptional heart care, we have created designated Provider Care Teams.  These Care Teams include your primary Cardiologist (physician) and Advanced Practice Providers (APPs -  Physician Assistants and Nurse Practitioners) who all work together to provide you with the care you need, when you need it.  We recommend signing up for the patient portal called "MyChart".  Sign up information is provided on this After Visit Summary.  MyChart is used to connect with patients for Virtual Visits (Telemedicine).  Patients are able to view lab/test results, encounter notes, upcoming appointments, etc.  Non-urgent messages can be sent to your provider as well.   To learn more about what you can do with MyChart, go to [email or web address].    Your next appointment:    Keep appointment with Dr. Antoine Poche  Provider:   Rollene Rotunda, MD        Signed, Maisie Fus, MD  07/29/2022 2:22 PM    [facility]

## 2022-07-29 NOTE — Patient Instructions (Signed)
Medication Instructions:  *If you need a refill on your cardiac medications before your next appointment, please call your pharmacy*   Lab Work: If you have labs (blood work) drawn today and your tests are completely normal, you will receive your results only by: MyChart Message (if you have MyChart) OR A paper copy in the mail If you have any lab test that is abnormal or we need to change your treatment, we will call you to review the results.   Testing/Procedures:  [employer] Cardiac Event Monitor Instructions  Your physician has requested you wear your cardiac event monitor for ___30__ days, (1-30). [employer] may call or text to confirm a shipping address. The monitor will be sent to a land address via UPS. [employer] will not ship a monitor to a PO BOX. It typically takes 3-5 days to receive your monitor after it has been enrolled. [employer] will assist with USPS tracking if your package is delayed. The telephone number for [employer] is [phone].  Once you have received your monitor, please review the enclosed instructions. Instruction tutorials can also be viewed under help and settings on the enclosed cell phone. Your monitor has already been registered assigning a specific monitor serial # to you.  Billing and Self Pay Discount Information  [employer] has been provided the insurance information we had on file for you.  If your insurance has been updated, please call [employer] at [phone] to provide them with your updated insurance information.   [employer] offers a discounted Self Pay option for patients who have insurance that does not cover their cardiac event monitor or patients without insurance.  The discounted cost of a Self Pay Cardiac Event Monitor would be $225.00 , if the patient contacts [employer] at [phone] within 7 days of applying the monitor to make payment arrangements.  If the patient does not contact [employer] within 7 days of applying the  monitor, the cost of the cardiac event monitor will be $350.00.  Applying the monitor  Remove cell phone from case and turn it on. The cell phone works as IT consultant and needs to be within [employer] of you at all times. The cell phone will need to be charged on a daily basis. We recommend you plug the cell phone into the enclosed charger at your bedside table every night.  Monitor batteries: You will receive two monitor batteries labelled #1 and #2. These are your recorders. Plug battery #2 onto the second connection on the enclosed charger. Keep one battery on the charger at all times. This will keep the monitor battery deactivated. It will also keep it fully charged for when you need to switch your monitor batteries. A small light will be blinking on the battery emblem when it is charging. The light on the battery emblem will remain on when the battery is fully charged.  Open package of a Monitor strip. Insert battery #1 into black hood on strip and gently squeeze monitor battery onto connection as indicated in instruction booklet. Set aside while preparing skin.  Choose location for your strip, vertical or horizontal, as indicated in the instruction booklet. Shave to remove all hair from location. There cannot be any lotions, oils, powders, or colognes on skin where monitor is to be applied. Wipe skin clean with enclosed Saline wipe. Dry skin completely.  Peel paper labeled #1 off the back of the Monitor strip exposing the adhesive. Place the monitor on the chest in the vertical or horizontal position shown in the instruction booklet.  One arrow on the monitor strip must be pointing upward. Carefully remove paper labeled #2, attaching remainder of strip to your skin. Try not to create any folds or wrinkles in the strip as you apply it.  Firmly press and release the circle in the center of the monitor battery. You will hear a small beep. This is turning the monitor battery on. The  heart emblem on the monitor battery will light up every 5 seconds if the monitor battery in turned on and connected to the patient securely. Do not push and hold the circle down as this turns the monitor battery off. The cell phone will locate the monitor battery. A screen will appear on the cell phone checking the connection of your monitor strip. This may read poor connection initially but change to good connection within the next minute. Once your monitor accepts the connection you will hear a series of 3 beeps followed by a climbing crescendo of beeps. A screen will appear on the cell phone showing the two monitor strip placement options. Touch the picture that demonstrates where you applied the monitor strip.  Your monitor strip and battery are waterproof. You are able to shower, bathe, or swim with the monitor on. They just ask you do not submerge deeper than 3 feet underwater. We recommend removing the monitor if you are swimming in a lake, river, or ocean.  Your monitor battery will need to be switched to a fully charged monitor battery approximately once a week. The cell phone will alert you of an action which needs to be made.  On the cell phone, tap for details to reveal connection status, monitor battery status, and cell phone battery status. The green dots indicates your monitor is in good status. A red dot indicates there is something that needs your attention.  To record a symptom, click the circle on the monitor battery. In 30-60 seconds a list of symptoms will appear on the cell phone. Select your symptom and tap save. Your monitor will record a sustained or significant arrhythmia regardless of you clicking the button. Some patients do not feel the heart rhythm irregularities. [employer] will notify us of any serious or critical events.  Refer to instruction booklet for instructions on switching batteries, changing strips, the Do not disturb or Pause features, or any  additional questions.  Call [employer] at [phone], to confirm your monitor is transmitting and record your baseline. They will answer any questions you may have regarding the monitor instructions at that time.  Returning the monitor to [employer]  Place all equipment back into blue box. Peel off strip of paper to expose adhesive and close box securely. There is a prepaid UPS shipping label on this box. Drop in a UPS drop box, or at a UPS facility like [employer]. You may also contact [employer] to arrange UPS to pick up monitor package at your home.  Your physician has requested that you have an echocardiogram. Echocardiography is a painless test that uses sound waves to create images of your heart. It provides your doctor with information about the size and shape of your heart and how well your heart's chambers and valves are working. This procedure takes approximately one hour. There are no restrictions for this procedure. Please do NOT wear cologne, aftershave, or lotions (deodorant is allowed).  Please arrive 15 minutes prior to your appointment time.    Follow-Up: At [facility], you and your health needs are our priority.  As part of our  continuing mission to provide you with exceptional heart care, we have created designated Provider Care Teams.  These Care Teams include your primary Cardiologist (physician) and Advanced Practice Providers (APPs -  Physician Assistants and Nurse Practitioners) who all work together to provide you with the care you need, when you need it.  We recommend signing up for the patient portal called "MyChart".  Sign up information is provided on this After Visit Summary.  MyChart is used to connect with patients for Virtual Visits (Telemedicine).  Patients are able to view lab/test results, encounter notes, upcoming appointments, etc.  Non-urgent messages can be sent to your provider as well.   To learn more about what you can do with MyChart, go to  [email or web address].    Your next appointment:    Keep appointment with Dr. Antoine Poche  Provider:   Rollene Rotunda, MD

## 2022-08-03 DIAGNOSIS — R55 Syncope and collapse: Secondary | ICD-10-CM | POA: Insufficient documentation

## 2022-08-03 NOTE — Progress Notes (Unsigned)
Cardiology Office Note:   Date:  08/05/2022  ID:  [name], DOB 10/01/1935, MRN [id number] PCP: Mechele Claude, MD  [facility] Providers Cardiologist:  Rollene Rotunda, MD {  History of Present Illness:   [name] is a 87 y.o. male  who presents for evaluation of syncope.  He has a history of  mitral valve repair.  Echo 10/2015 demonstrated stable MV repair.  While in the hospital in 2017 for knee surgery he developed persistent atrial fib.  He had cardioversion but went back into atrial fib.  He wore a Holter monitor which demonstrated frequent 3.5 second pauses at night. His average heart rate was about 80.   He is treated for sleep apnea.  In 2019 he had some double vision and was told he could have had a "small stroke."  His INR was therapeutic. Follow up echo was normal.  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  He was also seen by neurology.     He was seen recently by Dr. Wyline Mood as a work in to evaluate syncope.  He is now wearing a monitor.  Of note his blood pressure was running low and earlier this month his amlodipine was stopped by his primary provider.  He reports about 3 episodes where he is sitting in a couple of times he has been holding coffee he wakes up and he spilled his coffee or the coffee cup is on the floor.  He does not think he is falling asleep.  He does not hit the floor.  It does not happen when he is up and moving around.  He is not noticing any new palpitations although he clearly has paroxysmal atrial fibrillation.  He is not having any chest pressure, neck or arm discomfort recently.  He is having some problems with his CPAP.  He can only wear it about 4 hours a night.  He irritates his nose.  ROS: As stated in the HPI and negative for all other systems.  Studies Reviewed:    EKG:   NA  Risk Assessment/Calculations:    CHA2DS2-VASc Score = 6   This indicates a 9.7% annual risk of  stroke. The patient's score is based upon: CHF History: 0 HTN History: 1 Diabetes History: 1 Stroke History: 2 Vascular Disease History: 0 Age Score: 2 Gender Score: 0        Physical Exam:   VS:  BP (!) 157/73   Pulse (!) 55   Ht 5\' 11"  (1.803 m)   Wt 194 lb (88 kg)   SpO2 97%   BMI 27.06 kg/m    Wt Readings from Last 3 Encounters:  08/05/22 194 lb (88 kg)  07/29/22 194 lb 3.2 oz (88.1 kg)  07/22/22 193 lb 6.4 oz (87.7 kg)     GEN: Well nourished, well developed in no acute distress NECK: No JVD; No carotid bruits CARDIAC: RRR, 2/6 brief apical systolic murmur, no diastolic murmurs, rubs, gallops RESPIRATORY:  Clear to auscultation without rales, wheezing or rhonchi  ABDOMEN: Soft, non-tender, non-distended EXTREMITIES:  No edema; No deformity   ASSESSMENT AND PLAN:   SYNCOPE: The etiology of this is not clear.  He is wearing a 4 week monitor and I will see him after this.  He was not orthostatic in the office today.   At this point no further testing is indicated.    ATRIAL FIB:    [name] has a  CHA2DS2 - VASc of 5.   He tolerates anticoagulation.    SLEEP APNEA:   I will move his appt up with Dr. Tresa Endo.  I wonder if he might be a candidate for hypoglossal muscle nerve stimulator.  I am wondering if sleep apnea could be causing some of his events  MITRAL VALVE REPAIR:    This was stable last year.  No change in therapy.  He understands endocarditis prophylaxis.   HTN:  The blood pressure is at target on current meds and he is off amlodipine.  He will continue with the meds as listed.      Follow up me after the monitor.    Signed, Rollene Rotunda, MD

## 2022-08-04 ENCOUNTER — Telehealth: Payer: Self-pay | Admitting: Internal Medicine

## 2022-08-04 DIAGNOSIS — R55 Syncope and collapse: Secondary | ICD-10-CM

## 2022-08-04 NOTE — Telephone Encounter (Signed)
Jesus with [employer] is calling to report critical EKG results to someone in clinical staff.   Call transferred to triage.

## 2022-08-04 NOTE — Telephone Encounter (Signed)
Received a call from [name] with [employer] calling to report auto detect at 8:30 am today atrial fib rate 50 to 60.Stated this is first detected atrial fib.I will make Dr.Branch aware.

## 2022-08-05 ENCOUNTER — Ambulatory Visit: Payer: Medicare Other | Attending: Cardiology | Admitting: Cardiology

## 2022-08-05 ENCOUNTER — Telehealth: Payer: Self-pay | Admitting: Internal Medicine

## 2022-08-05 ENCOUNTER — Encounter: Payer: Self-pay | Admitting: Cardiology

## 2022-08-05 ENCOUNTER — Telehealth: Payer: Self-pay

## 2022-08-05 ENCOUNTER — Telehealth: Payer: Self-pay | Admitting: Cardiology

## 2022-08-05 VITALS — BP 157/73 | HR 55 | Ht 71.0 in | Wt 194.0 lb

## 2022-08-05 DIAGNOSIS — G4733 Obstructive sleep apnea (adult) (pediatric): Secondary | ICD-10-CM

## 2022-08-05 DIAGNOSIS — I482 Chronic atrial fibrillation, unspecified: Secondary | ICD-10-CM | POA: Diagnosis not present

## 2022-08-05 DIAGNOSIS — Z9889 Other specified postprocedural states: Secondary | ICD-10-CM

## 2022-08-05 DIAGNOSIS — R55 Syncope and collapse: Secondary | ICD-10-CM

## 2022-08-05 DIAGNOSIS — I10 Essential (primary) hypertension: Secondary | ICD-10-CM | POA: Diagnosis not present

## 2022-08-05 NOTE — Telephone Encounter (Signed)
Left voicemail to return call to office.

## 2022-08-05 NOTE — Patient Instructions (Signed)
Medication Instructions:  Your physician recommends that you continue on your current medications as directed. Please refer to the Current Medication list given to you today.  *If you need a refill on your cardiac medications before your next appointment, please call your pharmacy*  Follow-Up: At [facility], you and your health needs are our priority.  As part of our continuing mission to provide you with exceptional heart care, we have created designated Provider Care Teams.  These Care Teams include your primary Cardiologist (physician) and Advanced Practice Providers (APPs -  Physician Assistants and Nurse Practitioners) who all work together to provide you with the care you need, when you need it.   Your next appointment:   6 week(s)  Provider:   Rollene Rotunda, MD  in [facility]   Follow up with Dr. Tresa Endo for a sleep appointment

## 2022-08-05 NOTE — Telephone Encounter (Signed)
Follow Up:     Patient is returning a call from today. 

## 2022-08-05 NOTE — Telephone Encounter (Signed)
Patient is returning call. Please return call to [phone].

## 2022-08-05 NOTE — Telephone Encounter (Signed)
Patient out walking the dog. Wife will have him return call to office

## 2022-08-05 NOTE — Telephone Encounter (Signed)
Duplicate encounter. Please see previous encounter.  

## 2022-08-05 NOTE — Telephone Encounter (Signed)
Received call on on-call pager from [facility] [employer] regarding critical EKG. They note that at 9:39pm today, Mr. Passariello had a 12 seconds where he was in slow Afib to 30s bpm. No patient triggered events then and his rates have since come up to the 40s bpm. Usually during the day, they note that his HR is in 60-70s bpm. I called Mr. Decandia to see if he was symptomatic. He denies lightheadedness, dizziness, any falls or syncope/presyncope, feels at his baseline. Was just getting off to sleep with his CPAP around the time of the critical EKG. We discussed his bradycardia and noted that should he start to feel symptomatic with presyncope, then he should come to the ED for evaluation. Otherwise, if asymptomatic and only bradycardiac while asleep, we discussed that it would be okay to continue the workup he's undergoing as an outpatient. He is in atrial fibrillation and continues to take his apixaban as prescribed. Not on any BB or CCB based on chart review. I let him know that I would forward this message to his cardiologist Dr. Antoine Poche as an Lorain Childes and his team would reach out if there was anything additional he wanted Mr. Quintin to do.  Bella Kennedy, MD Cardiology 08/05/2022

## 2022-08-06 ENCOUNTER — Telehealth: Payer: Self-pay

## 2022-08-06 ENCOUNTER — Encounter: Payer: Self-pay | Admitting: Cardiology

## 2022-08-06 ENCOUNTER — Telehealth: Payer: Self-pay | Admitting: Cardiology

## 2022-08-06 NOTE — Telephone Encounter (Signed)
   Cardiac Monitor Alert  Date of alert:  08/05/19   Patient Name: [name]  DOB: May 01, 1935  MRN: [id number]   [facility] Cardiologist: Rollene Rotunda, MD  [facility] EP:  None    Monitor Information: Cardiac Event Monitor [[employer]]  Reason:  Syncope Ordering provider:  Hochrein   Alert Atrial Fibrillation/Flutter This is the  Day 16 of monitor  alert for this rhythm.  The patient has a hx of Atrial Fibrillation/Flutter.  The patient is not currently on anticoagulation.  Anticoagulation medication as of 08/05/2022           apixaban (ELIQUIS) 5 MG TABS tablet Take 1 tablet (5 mg total) by mouth 2 (two) times daily.       Next Cardiology Appointment   Date:  08/05/2022  Provider:  Dr. Antoine Poche  I was unable to reach patient by phone yesterday. Called more than once and his wife stated he was out walking the dog on first attempt. The other attempt just got voicemail. He did have visit with provider. Patient had another alert. (Please see other encounter) I spoke with him this morning and he stated he did not feel a thing and he was asleep.    Other:   Westyn Driggers Merilynn Finland, LPN  05/24/8117 14:78 PM

## 2022-08-06 NOTE — Telephone Encounter (Signed)
This additional telephone encounter opened by accident. Please see telephone encounter from 08/05/22. Will administratively close this encounter.

## 2022-08-06 NOTE — Telephone Encounter (Signed)
Received additional page from [employer] at 3:43 AM that Mr. Voong had a 3 second pause while in Afib. No patient trigger for symptoms and suspect he is asleep, especially given he was asymptomatic during our call earlier in the night. Will include here as FYI to Dr. Antoine Poche, but if bradycardia only occurring during sleep, suspect can continue to monitor for as an outpatient.  Bella Kennedy, MD Cardiology 08/06/2022

## 2022-08-06 NOTE — Telephone Encounter (Signed)
LVM that if there is an issue and receive a call , the caller will advise them on what they need to do based on patient symptoms.  If she has further questions please call the office

## 2022-08-06 NOTE — Telephone Encounter (Signed)
   Cardiac Monitor Alert  Date of alert:  08/05/22   Patient Name: [name]  DOB: 1935-05-25  MRN: [id number]   [facility] Cardiologist: Rollene Rotunda, MD  [facility] EP:  None    Monitor Information: Cardiac Event Monitor [[employer]]  Reason:  Syncope and Collapse Ordering provider:  Dr. Wyline Mood   Alert Bradycardia - slowest HR: 30 This is the 2nd alert for this rhythm.   Next Cardiology Appointment   Date:  08/06/22  Provider:  Dr. Antoine Poche  The patient was contacted today.  He is asymptomatic. Pt stated he was during the time of the event he was sitting down watching TV. 1st attempt -paged on call @ 0847 PM Bradycardia  Arrhythmia, symptoms and history reviewed with .   Plan:     Other:   Asencion Gowda, LPN  03/31/863 7:84 AM            Cardiac Monitor Alert  Date of alert:  08/06/2022   Patient Name: [name]  DOB: [birth year]  MRN: [id number]   [facility] Cardiologist: Rollene Rotunda, MD  [facility] EP:  None    Monitor Information: Cardiac Event Monitor [[employer]]  Reason:  Syncope and Collapse Ordering provider:  Dr. Wyline Mood   Alert Atrial Fibrillation/Flutter This is the 3rd alert for this rhythm. 1 st attempt paged on call @ 4:58 AM spoke to on call @ 4:58 AM , bradycardia. The patient has a hx of Atrial Fibrillation/Flutter.  The patient is not currently on anticoagulation.  Anticoagulation medication as of 08/06/2022           apixaban (ELIQUIS) 5 MG TABS tablet Take 1 tablet (5 mg total) by mouth 2 (two) times daily.       Next Cardiology Appointment   Date:  08/06/22  Provider:  Dr. Antoine Poche  The patient was contacted today.  He is asymptomatic. Spoke to the patient during the time of the event pt stated he was asleep.      Other:   Asencion Gowda, LPN  1/32/4401 0:27 AM

## 2022-08-06 NOTE — Telephone Encounter (Signed)
Patient's wife called stating patient is on a heart monitor.  They received a call last night around 10:30pm saying that his heart rate was low. She said she got a call again this morning saying he HR was in the 30's.  She wants to know if she gets another call again like that tonight what she should do, if she should take him to the emergency room.  Please advise.

## 2022-08-09 ENCOUNTER — Telehealth: Payer: Self-pay | Admitting: Internal Medicine

## 2022-08-09 NOTE — Telephone Encounter (Signed)
Called [name] about his cardiac monitor alerts. He is having slow afib with significant pauses late at night and the early AM. We discussed continuing to monitor for pauses during the day with symptoms. In which case, would recommend referral for PPM.

## 2022-08-10 ENCOUNTER — Telehealth: Payer: Self-pay

## 2022-08-10 NOTE — Telephone Encounter (Signed)
   Cardiac Monitor Alert  Date of alert:  08/10/2022   Patient Name: [name]  DOB: [birth year]  MRN: [id number]   [facility] Cardiologist: Rollene Rotunda, MD  [facility] EP:  None    Monitor Information: Cardiac Event Monitor [[employer]]  Reason:  Syncope and collapse Ordering provider:  Dr. Carolan Clines :1}  Alert Atrial Fibrillation/Flutter This is the 4th alert for this rhythm.  The patient has a hx of Atrial Fibrillation/Flutter.   Pause(s) - Longest:  3 seconds This is the 3rd alert for this rhythm.   Next Cardiology Appointment   Date:  10/22/22  Provider:  Antoine Poche  The patient's wife was contacted today. She states that the patient was asleep at the time of the monitor alerts (1:35am and 2:30am). However she does state later in the night sometime between 3:00am and 4:00am the patient got up and went to the bathroom. She states that he came back to bed and sat on the edge of the bed. He reached over to grab water off of the nightstand and fell over. She states that he did not injury himself and does not think that he lost consciousness. The patient felt fine and went back to sleep. Woke up this morning with no complaint .   Arrhythmia, symptoms and history reviewed with .   Plan:     Frutoso Schatz, RN  08/10/2022 8:39 AM

## 2022-08-10 NOTE — Telephone Encounter (Signed)
Spoke with Dr. Wyline Mood and informed her wife stated to Nurse Kinnie Feil that around 3am patient was coming from the kitchen and when he was getting to get back in bed he fell over. We were unable to speak with patient because he is out golfing this morning.  Dr. Wyline Mood stated if his alerts are after 10pm that its ok. She did not recommend any changes at this time

## 2022-08-11 ENCOUNTER — Telehealth: Payer: Self-pay

## 2022-08-11 NOTE — Telephone Encounter (Signed)
   Cardiac Monitor Alert  Date of alert:  08/11/2022   Patient Name: [name]  DOB: 1935/12/17  MRN: [id number]   [facility] Cardiologist: Rollene Rotunda, MD  [facility] EP:  N/A Monitor Information: Cardiac Event Monitor [[employer]]  Reason:  Syncope Ordering provider:  Dr. Wyline Mood   Alert Atrial Fibrillation/Flutter This is the  day 8 of 30 days  alert for this rhythm.  The patient has a hx of Atrial Fibrillation/Flutter.   Anticoagulation medication as of 08/11/2022           apixaban (ELIQUIS) 5 MG TABS tablet Take 1 tablet (5 mg total) by mouth 2 (two) times daily.       Next Cardiology Appointment   Date:  10/22/2022  Provider:  Dr. Antoine Poche  The patient was contacted today.  He is asymptomatic. Arrhythmia, symptoms and history reviewed with Dr.Hochrein.  Plan:  No changes    Other: No changes at this time. Patient was asleep when the alerts happened. He is on his way to the gym.  [name] Prada Merilynn Finland, LPN  2/53/6644 0:34 AM

## 2022-08-12 ENCOUNTER — Telehealth: Payer: Self-pay

## 2022-08-12 NOTE — Telephone Encounter (Signed)
   Cardiac Monitor Alert  Date of alert:  08/12/2022   Patient Name: [name]  DOB: [birth year]  MRN: [id number]   [facility] Cardiologist: Rollene Rotunda, MD  [facility] EP:  None    Monitor Information: Cardiac Event Monitor [[employer]]  Reason:  Syncope  Ordering provider:  Dr. Wyline Mood   Alert Atrial Fibrillation/Flutter This is the  9th  alert for this rhythm.  The patient has a hx of Atrial Fibrillation/Flutter.    Anticoagulation medication as of 08/12/2022           apixaban (ELIQUIS) 5 MG TABS tablet Take 1 tablet (5 mg total) by mouth 2 (two) times daily.       Next Cardiology Appointment   Date:  10/22/2022  Provider:  Dr.Hochrein  The patient was contacted today.  He is asymptomatic. Arrhythmia, symptoms and history reviewed with Dr.Hochrein.  Plan:  No changes at this time    Other: Patient was asleep at the time of alert. He states he feels fine.  Carie Kapuscinski R Maryclare Bean, LPN  05/24/8117 1:47 AM

## 2022-08-13 ENCOUNTER — Encounter: Payer: Self-pay | Admitting: Internal Medicine

## 2022-08-13 ENCOUNTER — Telehealth: Payer: Self-pay | Admitting: Cardiology

## 2022-08-13 NOTE — Telephone Encounter (Signed)
[name] from [employer] called this morning at 3:53am to report critical EKG. No reference number given via after hours answering service.

## 2022-08-13 NOTE — Telephone Encounter (Signed)
Left voicemail for patient to return call to office. 

## 2022-08-13 NOTE — Telephone Encounter (Signed)
Fyi.  Patient returned call and states he was at sleep at 3:56 in the morning when the alert on his monitor went off. He states he feel just fine. He did not feel a thing.   We did not get the fax for the alert.

## 2022-08-16 ENCOUNTER — Telehealth: Payer: Self-pay

## 2022-08-16 NOTE — Telephone Encounter (Signed)
   Cardiac Monitor Alert  Date of alert:  08/16/2022 , 08/14/22  Patient Name: [name]  DOB: [birth year]  MRN: [id number]   [facility] Cardiologist: Rollene Rotunda, MD  [facility] EP:  None    Monitor Information: Cardiac Event Monitor [[employer]]  Reason:  Sybcope Ordering provider:  Dr. Wyline Mood   Alert Atrial Fibrillation/Flutter This is the  day 11 and day 13 of  alert for this rhythm.  The patient has a hx of Atrial Fibrillation/Flutter.   Anticoagulation medication as of 08/16/2022           apixaban (ELIQUIS) 5 MG TABS tablet Take 1 tablet (5 mg total) by mouth 2 (two) times daily.       Next Cardiology Appointment   Date:  10/22/22  Provider:  Dr. Antoine Poche  The patient was contacted today.  He is asymptomatic. Patient was asleep for all alerts.     Other:   [name] Merilynn Finland, LPN  4/0/9811 9:14 PM

## 2022-08-17 ENCOUNTER — Telehealth: Payer: Self-pay

## 2022-08-17 NOTE — Telephone Encounter (Signed)
Patient is currently on golf course was calling to see how he was doing. We received alert from monitor.

## 2022-08-18 ENCOUNTER — Telehealth: Payer: Self-pay | Admitting: Physician Assistant

## 2022-08-18 NOTE — Telephone Encounter (Addendum)
   Cardiac Monitor Alert  Date of alert:  08/18/2022   Patient Name: [name]  DOB: Apr 26, 1935  MRN: [id number]   [facility] Cardiologist: Rollene Rotunda, MD  [facility] EP:  None    Monitor Information: Cardiac Event Monitor [[employer]]  Reason:  syncope Ordering provider:  Dr. Carolan Clines per 07/29/22 work in visit, more recently saw Dr. Antoine Poche 08/05/22 who is primary cardiologist   Alert  Received notification from [employer] monitor company that patient had bradycardia in atrial fibrillation with HR between 20-50bpm in atrial fibrillation with 5.79 second non-conversion pause around 4:49AM. As of most recent tracing after 6:12AM he is in afib with HR 60bpm.    He has multiple prior event monitor alerts in the chart since monitor was placed for afib/flutter with several indicating bradycardia in the 30s and brief pauses particularly during sleeping hours. He is not on any AVN blocking agents. He is on apixaban.  I reached out to patient who affirms he was asleep at that time and feels great presently.  I will route to our [facility] triage team to bring to Dr. Jenene Slicker attention in clinic this morning for review and advisement as strips should be faxed to to the office, will also cc to him as well. MD may decide to augment the notification criteria to the office as I suspect we will continue to get frequent notifications (and he is wearing monitor through 7/18), but will defer to Dr. [place] Lions.   Laurann Montana, PA-C  08/18/2022 6:44 AM

## 2022-08-19 ENCOUNTER — Telehealth: Payer: Self-pay | Admitting: Student

## 2022-08-19 NOTE — Telephone Encounter (Signed)
   Cardiac Monitor Alert  Date of alert:  08/19/2022   Patient Name: [name]  DOB: 26-May-1935  MRN: [id number]   [facility] Cardiologist: Rollene Rotunda, MD  [facility] EP:  None    Monitor Information: Cardiac Event Monitor [[employer]]  Reason:  Syncope Ordering provider: Dr. Wyline Mood  Received a notification from the monitoring company he had an pause of 3.8 seconds while in atrial fibrillation overnight. Contacted the patient who confirmed he was asymptomatic and sleeping at that time. Already on Eliquis for anticoagulation.   Ellsworth Lennox, PA-C  08/19/2022 8:11 AM

## 2022-08-20 NOTE — Telephone Encounter (Signed)
Any recommendations or changes prior to appt 9/6 with you?

## 2022-08-20 NOTE — Telephone Encounter (Signed)
Spoke with [name] from [employer], he is aware we would like to set parameters on the monitor. Per dr hochrein we wanted to be contacted for any pauses of 7 sec or more only. We are aware of the pauses the patient is having during the sleeping hours.

## 2022-08-23 ENCOUNTER — Telehealth: Payer: Self-pay

## 2022-08-23 ENCOUNTER — Telehealth: Payer: Self-pay | Admitting: Cardiology

## 2022-08-23 NOTE — Telephone Encounter (Signed)
Follow up scheduled

## 2022-08-23 NOTE — Telephone Encounter (Signed)
Rollene Rotunda, MD  Cv Div Nl Triage17 hours ago (2:35 PM)    He can see me the next week I am in the office.

## 2022-08-23 NOTE — Telephone Encounter (Signed)
Pt returning nurses call from earlier. Please advise 

## 2022-08-23 NOTE — Telephone Encounter (Signed)
Reports for several pause of less than 4 seconds.  Per Dr Antoine Poche, to alert for pause 7 seconds or greater.  All recordings to D. Mathis,RN for upload.  Nothing further needed at this time.

## 2022-08-23 NOTE — Telephone Encounter (Signed)
Return call regarding monitor alerts. Patient states no different but normal. He is set for appt with Dr Antoine Poche in August , soonest available with Dr Antoine Poche.

## 2022-08-23 NOTE — Telephone Encounter (Signed)
Call patient home number regarding alerts.  LVM to call office. Call to Mobile number.  No VM set up

## 2022-08-25 ENCOUNTER — Telehealth: Payer: Self-pay

## 2022-08-25 ENCOUNTER — Telehealth: Payer: Self-pay | Admitting: Cardiology

## 2022-08-25 NOTE — Telephone Encounter (Signed)
   Cardiac Monitor Alert  Date of alert:  08/25/2022   Patient Name: [name]  DOB: June 06, 1935  MRN: [id number]   [facility] Cardiologist: Rollene Rotunda, MD  [facility] EP:  None    Monitor Information: Cardiac Event Monitor [[employer]]  Reason:  Syncope  Ordering provider:  Dr. Wyline Mood   Alert Atrial Fibrillation/Flutter This is the  Day 22 of alerts  alert for this rhythm.  The patient has no hx of Atrial Fibrillation/Flutter. .  Anticoagulation medication as of 08/25/2022           apixaban (ELIQUIS) 5 MG TABS tablet Take 1 tablet (5 mg total) by mouth 2 (two) times daily.       Next Cardiology Appointment   Date:  09/16/22  Provider:  Dr.Hochrein  The patient was contacted today.  He is asymptomatic. Arrhythmia, symptoms and history reviewed with Dr.Harding.  Plan:  Continue to monitor    Other: Patient was asleep when alert went off. He is feeling fine.  Rossie Scarfone Merilynn Finland, LPN  05/24/8117 1:47 PM

## 2022-08-25 NOTE — Telephone Encounter (Signed)
Caller reporting critical EKG results

## 2022-08-25 NOTE — Telephone Encounter (Signed)
Scott calling to report EKG results

## 2022-08-25 NOTE — Telephone Encounter (Signed)
   Cardiac Monitor Alert  Date of alert:  08/25/2022   Patient Name: [name]  DOB: [birth year]  MRN: [id number]   [facility] Cardiologist: Rollene Rotunda, MD  [facility] EP:  None    Monitor Information: Cardiac Event Monitor [[employer]]  Reason:  Syncope and collapse Ordering provider:  Dr. Carolan Clines   Alert Atrial Fibrillation/Flutter This is the  day of 22 of 20,  2  alerts for this rhythm.  The patient has no hx of Atrial Fibrillation/Flutter. Spoke with [employer], [employer] this is a delayed monitor alert. 08/24/22 @ 0133 AM Atrial Fibrillation 26-30 BPM. Second alert 08/24/22 @ 0220, Atrial Flutter with Variable Conduction w/PVCs.   Patient stated he was asleep during both events. Currently asymptomatic.  The patient is not currently on anticoagulation.  Anticoagulation medication as of 08/25/2022           apixaban (ELIQUIS) 5 MG TABS tablet Take 1 tablet (5 mg total) by mouth 2 (two) times daily.       Next Cardiology Appointment   Date:  09/16/22  Provider:  Dr. Antoine Poche Patient has scheduled echocardiogram on 09/10/22 The patient was contacted today.  He is asymptomatic. Arrhythmia, symptoms and history reviewed with Dr. Wyline Mood.  Plan:  Continue to monitor       Asencion Gowda, LPN  05/24/8117 14:78 AM

## 2022-08-26 ENCOUNTER — Telehealth: Payer: Self-pay | Admitting: Cardiology

## 2022-08-26 ENCOUNTER — Telehealth: Payer: Self-pay

## 2022-08-26 NOTE — Telephone Encounter (Signed)
   Cardiac Monitor Alert  Date of alert:  08/25/22- 08/26/2022   Patient Name: [name]  DOB: 03/05/1935  MRN: [id number]   [facility] Cardiologist: Rollene Rotunda, MD  [facility] EP:  None    Monitor Information: Cardiac Event Monitor [[employer]]  Reason:  Syncope Ordering provider:  Dr.Branch   Alert Atrial Fibrillation/Flutter This is the  Day 23 of alerts   alert for this rhythm.  The patient has a hx of Atrial Fibrillation/Flutter.   Anticoagulation medication as of 08/26/2022           apixaban (ELIQUIS) 5 MG TABS tablet Take 1 tablet (5 mg total) by mouth 2 (two) times daily.       Next Cardiology Appointment   Date:  09/16/22  Provider:  Dr.Hochrein  The patient was contacted today.  He is asymptomatic. Arrhythmia, symptoms and history reviewed with Dr. Wyline Mood.  Plan:  Pending results keep follow up appointment with Dr. Antoine Poche. Patient reported being a little fatigue and dizzy. His BP was 115/57 this morning. Advised to stay hydrated in the heat because he walks his dog a lot and likes to golf. Continue to monitor blood pressure and heart rate. Discussed ED precautions. Patient verbalized understanding. He spoke with on call as well. Perlie Gold PA).     Other:   Yexalen Deike Merilynn Finland, LPN  05/24/8117 1:47 AM

## 2022-08-26 NOTE — Telephone Encounter (Signed)
    Received a page from [facility] [employer] this morning about abnormal rhythm on patient's monitor. Per rep that I spoke with, patient in persistent afib that does not appear to be new but around 5:50 am EST today had rates into the upper 30s-low 40s with isolated 3 second pause. I called and spoke with patient this morning who reports being asleep at time of this event recording. He admits to feeling a little weak/fatigued this morning but says that this is not new. Denies focal symptoms/events such as syncope or fall. Reviewed emergency precautions with patient who confirms understanding. Has upcoming appointment with Dr. Antoine Poche on 8/1. Will forward this message to him for continuity of care.  Perlie Gold, PA-C

## 2022-08-27 ENCOUNTER — Telehealth: Payer: Self-pay | Admitting: Student in an Organized Health Care Education/Training Program

## 2022-08-27 ENCOUNTER — Telehealth: Payer: Self-pay

## 2022-08-27 ENCOUNTER — Telehealth: Payer: Self-pay | Admitting: Cardiology

## 2022-08-27 NOTE — Telephone Encounter (Signed)
Received call from [facility] [employer] that patient had 2 pauses at 2:55 AM today. At the time, patient was in atrial fibrillation with HR in the 30s. Had a 4.4 second pause, and later had a 3 second pause. Per review of chart, there have been  multiple prior event monitor alerts for bradycardia with HR in the 30s and brief pauses. These events primarily occurred during sleep.   I talked to the patient who reports that he was asleep at 2:55 AM. This AM, he feels a little lightheaded, but otherwise OK. He does occasionally feel a bit lightheaded when he first wakes up, and he has not yet eaten breakfast yet today. I instructed patient to continue to monitor his symptoms, and to call our office if lightheadedness persists. Also instructed him to go to the ED if he has syncope.   Patient has a follow up appointment with Dr. Antoine Poche on 8/1. I will forward this message to him as an Olegario Shearer, PA-C 08/27/2022 6:55 AM

## 2022-08-27 NOTE — Telephone Encounter (Signed)
Cardiac Monitor Alert   Date of alert:  08/27/2022    Patient Name: [name]  DOB: Jul 01, 1935  MRN: [id number]    [facility] Cardiologist: Rollene Rotunda, MD  [facility] EP:  None     Monitor Information: Cardiac Event Monitor [[employer]]  Reason:Syncope Ordering provider:  Dr.Branch   Alert Atrial Fibrillation/Flutter with slow ventricular response in the 30s. This alert is not new for the patient and has had many reports for bradycardia.   Patient not contacted.   Rosita Kea, MD MS  Cardiology [facility]

## 2022-08-27 NOTE — Telephone Encounter (Signed)
Call to patient regarding Heart monitor alerts. Patient states did not notice any issues. Slept through the night without issues.

## 2022-08-28 ENCOUNTER — Telehealth: Payer: Self-pay | Admitting: Internal Medicine

## 2022-08-28 NOTE — Telephone Encounter (Signed)
Contacted by [employer] regarding patient having a 3 sec pause and heart rate 30 bpm for a few seconds while patient was likely sleeping.  Informed the caller that only send alerts for pauses >5 secs.

## 2022-08-30 ENCOUNTER — Telehealth: Payer: Self-pay | Admitting: Internal Medicine

## 2022-08-30 NOTE — Telephone Encounter (Signed)
[name] is calling to report a critical EKG. Call transferred to triage.

## 2022-08-30 NOTE — Telephone Encounter (Signed)
Patient is at the gym currently. Wife will have him return call

## 2022-08-30 NOTE — Telephone Encounter (Signed)
   Cardiac Monitor Alert  Date of alert:  08/30/2022   Patient Name: [name]  DOB: Nov 10, 1935  MRN: [id number]   [facility] Cardiologist: Rollene Rotunda, MD  [facility] EP:  None    Monitor Information: Cardiac Event Monitor [[employer]]  Reason:  Syncope Ordering provider:  DR. Wyline Mood   Alert Atrial Fibrillation/Flutter This is the  Day 27 with numerous  alert for this rhythm.  The patient has hx of Atrial Fibrillation/Flutter.   Anticoagulation medication as of 08/30/2022           apixaban (ELIQUIS) 5 MG TABS tablet Take 1 tablet (5 mg total) by mouth 2 (two) times daily.       Next Cardiology Appointment   Date:  09/16/2022  Provider:  Dr.Hochrein  The patient was contacted today.  He is asymptomatic. Reviewed with Dr. Royann Shivers. Patient was awake for the 7am alerts and cooking breakfast. He feels fine. Went to the gym this morning. He was asleep for the alerts at 1:28am, 1:34 am, 1:45 am, 1:50( spoke with on call), 1:55 am and 3:15 am. Patient had multiple alerts over the weekend. He was asleep during those as well.   Other: Follow up depending monitor results.  [name] Maryclare Bean, LPN  1/32/4401 02:72 AM

## 2022-08-31 ENCOUNTER — Telehealth: Payer: Self-pay | Admitting: Cardiology

## 2022-08-31 ENCOUNTER — Telehealth: Payer: Self-pay

## 2022-08-31 NOTE — Telephone Encounter (Signed)
   Cardiac Monitor Alert  Date of alert:  08/31/2022   Patient Name: [name]  DOB: [birth year]  MRN: [id number]    [facility]ologist: Rollene Rotunda, MD   [facility] EP:  None    Monitor Information: Cardiac Event Monitor [[employer]]  Reason:  Syncope Ordering provider:  Dr.Branch   Alert Atrial Fibrillation/Flutter This is the  Day 28 of multple  alert for this rhythm.  The patient has no hx of Atrial Fibrillation/Flutter.   Anticoagulation medication as of 08/31/2022           apixaban (ELIQUIS) 5 MG TABS tablet Take 1 tablet (5 mg total) by mouth 2 (two) times daily.       Next Cardiology Appointment   Date:  09/16/22 Provider:  Dr. Antoine Poche  The patient was contacted today.  He is asymptomatic. Patient spoke with on call this morning for his alerts. He was asleep at the time and feels fine.    Other:   [name] Tindol Merilynn Finland, LPN  05/24/8117 1:47 PM

## 2022-08-31 NOTE — Telephone Encounter (Signed)
   Received page alert from [facility] [employer] this morning reporting that patient had ~1 min of slow HR, average 27 BPM around 5:27 EST this morning. Overall consistent with recent alerts for bradycardia while asleep. I called and spoke with patient who reports that he was asleep at the time of this slower HR. He is feeling well this morning, no lightheadedness/dizziness or weakness. Reviewed emergency precautions with patient who confirmed understanding.   Perlie Gold, PA-C

## 2022-08-31 NOTE — Telephone Encounter (Signed)
Left voicemail to return call to patient. He did speak with on call this morning

## 2022-09-01 ENCOUNTER — Telehealth: Payer: Self-pay

## 2022-09-01 NOTE — Telephone Encounter (Signed)
Rollene Rotunda, MD  Cv Div Nl Triage3 minutes ago (9:58 AM)    No change in therapy.

## 2022-09-01 NOTE — Telephone Encounter (Signed)
   Cardiac Monitor Alert  Date of alert:  09/01/2022   Patient Name: [name]  DOB: [birth year]  MRN: [id number]   [facility] Cardiologist: Rollene Rotunda, MD  [facility] EP:  None    Monitor Information: Cardiac Event Monitor [[employer]]  Reason:  Pause Ordering provider:  Dr Antoine Poche   Alert Pause(s) - Longest:  4.1 seconds This is the 1st alert for this rhythm.   Next Cardiology Appointment   Date:  September 16, 2022  Provider:  Dr Antoine Poche  The patient was contacted today.  He is asymptomatic. Arrhythmia, symptoms and history sent to provider.   Plan:  No changes   Other: Spoke with patient, Slept through all episodes.   Candie Chroman, LPN  9/56/2130 8:65 AM

## 2022-09-02 ENCOUNTER — Telehealth: Payer: Self-pay | Admitting: Cardiology

## 2022-09-02 NOTE — Telephone Encounter (Signed)
[employer] call to report critical for patient. At 959 am patient in AFIB and heart rate 30 bmp.     Cardiac Monitor Alert  Date of alert:  09/02/2022   Patient Name: [name]  DOB: 02-03-1936  MRN: [id number]   [facility] Cardiologist: Rollene Rotunda, MD  [facility] EP:  None    Monitor Information: Cardiac Event Monitor [[employer]]  Reason:  Syncope Ordering provider:  [facility]   Alert Atrial Fibrillation/Flutter This is the  Day 30 of  alert for this rhythm.  The patient has a hx of Atrial Fibrillation/Flutter.   Anticoagulation medication as of 09/02/2022           apixaban (ELIQUIS) 5 MG TABS tablet Take 1 tablet (5 mg total) by mouth 2 (two) times daily.       Next Cardiology Appointment   Date:  09/16/2022  Provider:  Antoine Poche  The patient was contacted today.  He is asymptomatic. Last day of monitor. Patient states he feels fine and was at home. He is asymptomatic.     Other:   [name], LPN  05/24/8117 14:78 AM

## 2022-09-02 NOTE — Telephone Encounter (Signed)
Calling to report critical EKG. Call transferred

## 2022-09-02 NOTE — Telephone Encounter (Signed)
Calling to report critical EKG results. Call transferred

## 2022-09-02 NOTE — Telephone Encounter (Signed)
   Cardiac Monitor Alert  Date of alert:  09/02/2022   Patient Name: [name]  DOB: [birth year]  MRN: [id number]   [facility] Cardiologist: Rollene Rotunda, MD   [facility] EP:  None    Monitor Information: Cardiac Event Monitor [[employer]]  Reason:  Syncope and Collapse Ordering provider:  Dr. Carolan Clines   Alert Atrial Fibrillation/Flutter This is the     alert for this rhythm. 09/02/22 0628 am Atrial Fibrillation Sustained 30 BPM The patient has a hx of Atrial Fibrillation/Flutter.  The patient is not currently on anticoagulation.  Anticoagulation medication as of 09/02/2022           apixaban (ELIQUIS) 5 MG TABS tablet Take 1 tablet (5 mg total) by mouth 2 (two) times daily.       Next Cardiology Appointment   Date:  09/16/22  Provider:  Dr. Antoine Poche  The patient was contacted today.  He is asymptomatic. Spoke to the patient, he was awake and getting ready for the  during the event. Pt stated he feels fine.     Other: Scheduled Echocardiogram on 09/10/22  Asencion Gowda, LPN  0/86/5784 6:96 AM

## 2022-09-08 ENCOUNTER — Ambulatory Visit: Payer: Medicare Other | Attending: Internal Medicine

## 2022-09-08 DIAGNOSIS — R55 Syncope and collapse: Secondary | ICD-10-CM

## 2022-09-10 ENCOUNTER — Ambulatory Visit (HOSPITAL_COMMUNITY)
Admission: RE | Admit: 2022-09-10 | Discharge: 2022-09-10 | Disposition: A | Discharge status: Home / Self Care | Payer: Medicare Other | Source: Ambulatory Visit | Attending: Internal Medicine | Admitting: Internal Medicine

## 2022-09-10 DIAGNOSIS — R55 Syncope and collapse: Secondary | ICD-10-CM | POA: Diagnosis not present

## 2022-09-10 LAB — ECHOCARDIOGRAM LIMITED
AR max vel: 1.62 cm2
AV Area VTI: 1.8 cm2
AV Area mean vel: 1.77 cm2
AV Mean grad: 5 mmHg
AV Peak grad: 10.6 mmHg
Ao pk vel: 1.63 m/s
S' Lateral: 2.9 cm

## 2022-09-10 NOTE — Progress Notes (Signed)
*  PRELIMINARY RESULTS* Echocardiogram 2D Echocardiogram has been performed.  Stacey Drain 09/10/2022, 12:25 PM

## 2022-09-14 ENCOUNTER — Other Ambulatory Visit: Payer: Self-pay | Admitting: Family Medicine

## 2022-09-15 NOTE — H&P (View-Only) (Signed)
Cardiology Office Note:   Date:  09/16/2022  ID:  [name], DOB [birth year], MRN [id number] PCP: Mechele Claude, MD  [facility] Providers Cardiologist:  Rollene Rotunda, MD {  History of Present Illness:   [name] is a 87 y.o. male  who presents for evaluation of syncope.  He has a history of  mitral valve repair.  Echo 10/2015 demonstrated stable MV repair.  While in the hospital in 2017 for knee surgery he developed persistent atrial fib.  He had cardioversion but went back into atrial fib.  He wore a Holter monitor which demonstrated frequent 3.5 second pauses at night. His average heart rate was about 80.   He is treated for sleep apnea.  In 2019 he had some double vision and was told he could have had a "small stroke."  His INR was therapeutic. Follow up echo was normal.  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  He was also seen by neurology.     He was seen Dr. Wyline Mood as a work in to evaluate syncope.    He wore a monitor and had atrial fib with slow ventricular rate.  This has demonstrated frequent ventricular pauses of 3 to almost 7 seconds.  Almost all of these were at night.  However, he has had another episode of syncope at night when he got up to go to the bathroom.  I cannot necessarily correlate this to any pause on his monitor.  He did have some episodes of junctional rhythm .  He otherwise has done well.  The patient denies any new symptoms such as chest discomfort, neck or arm discomfort. There has been no new shortness of breath, PND or orthopnea.  ROS: As stated in the HPI and negative for all other systems.  Studies Reviewed:    EKG:   NA  Risk Assessment/Calculations:    CHA2DS2-VASc Score = 6   This indicates a 9.7% annual risk of stroke. The patient's score is based upon: CHF History: 0 HTN History: 1 Diabetes History: 1 Stroke History: 2 Vascular Disease History: 0 Age Score: 2 Gender  Score: 0   Physical Exam:   VS:  BP (!) 112/48 (BP Location: Left Arm, Patient Position: Sitting, Cuff Size: Normal)   Pulse (!) 56   Ht 5\' 11"  (1.803 m)   Wt 194 lb 9.6 oz (88.3 kg)   SpO2 95%   BMI 27.14 kg/m    Wt Readings from Last 3 Encounters:  09/16/22 194 lb 9.6 oz (88.3 kg)  08/05/22 194 lb (88 kg)  07/29/22 194 lb 3.2 oz (88.1 kg)     GEN: Well nourished, well developed in no acute distress NECK: No JVD; No carotid bruits CARDIAC: Irregular RR, no murmurs, rubs, gallops RESPIRATORY:  Clear to auscultation without rales, wheezing or rhonchi  ABDOMEN: Soft, non-tender, non-distended EXTREMITIES:  No edema; No deformity   ASSESSMENT AND PLAN:   SYNCOPE:   He has tachybrady syndrome.  PPM is indicated.  He was seen today by Dr. Royann Shivers who reviewed in detail pacing and he scheduled for placement.    ATRIAL FIB:    [name] has a CHA2DS2 - VASc of 5.  He will continue meds as listed.  SLEEP APNEA:   He is followed by Dr. Tresa Endo.    MITRAL VALVE REPAIR:      He has stable valve repair.  No change in therapy.  [name]  with the results and send results to Mechele Claude, MD   HTN:  The blood pressure is at target.  NO change in therapy.       Follow up me after pacing.   Signed, Rollene Rotunda, MD

## 2022-09-15 NOTE — Progress Notes (Unsigned)
Cardiology Office Note:   Date:  09/16/2022  ID:  [name], DOB [birth year], MRN [id number] PCP: Mechele Claude, MD  [facility] Providers Cardiologist:  Rollene Rotunda, MD {  History of Present Illness:   [name] is a 87 y.o. male  who presents for evaluation of syncope.  He has a history of  mitral valve repair.  Echo 10/2015 demonstrated stable MV repair.  While in the hospital in 2017 for knee surgery he developed persistent atrial fib.  He had cardioversion but went back into atrial fib.  He wore a Holter monitor which demonstrated frequent 3.5 second pauses at night. His average heart rate was about 80.   He is treated for sleep apnea.  In 2019 he had some double vision and was told he could have had a "small stroke."  His INR was therapeutic. Follow up echo was normal.  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  He was also seen by neurology.     He was seen Dr. Wyline Mood as a work in to evaluate syncope.    He wore a monitor and had atrial fib with slow ventricular rate.  This has demonstrated frequent ventricular pauses of 3 to almost 7 seconds.  Almost all of these were at night.  However, he has had another episode of syncope at night when he got up to go to the bathroom.  I cannot necessarily correlate this to any pause on his monitor.  He did have some episodes of junctional rhythm .  He otherwise has done well.  The patient denies any new symptoms such as chest discomfort, neck or arm discomfort. There has been no new shortness of breath, PND or orthopnea.  ROS: As stated in the HPI and negative for all other systems.  Studies Reviewed:    EKG:   NA  Risk Assessment/Calculations:    CHA2DS2-VASc Score = 6   This indicates a 9.7% annual risk of stroke. The patient's score is based upon: CHF History: 0 HTN History: 1 Diabetes History: 1 Stroke History: 2 Vascular Disease History: 0 Age Score: 2 Gender  Score: 0   Physical Exam:   VS:  BP (!) 112/48 (BP Location: Left Arm, Patient Position: Sitting, Cuff Size: Normal)   Pulse (!) 56   Ht 5\' 11"  (1.803 m)   Wt 194 lb 9.6 oz (88.3 kg)   SpO2 95%   BMI 27.14 kg/m    Wt Readings from Last 3 Encounters:  09/16/22 194 lb 9.6 oz (88.3 kg)  08/05/22 194 lb (88 kg)  07/29/22 194 lb 3.2 oz (88.1 kg)     GEN: Well nourished, well developed in no acute distress NECK: No JVD; No carotid bruits CARDIAC: Irregular RR, no murmurs, rubs, gallops RESPIRATORY:  Clear to auscultation without rales, wheezing or rhonchi  ABDOMEN: Soft, non-tender, non-distended EXTREMITIES:  No edema; No deformity   ASSESSMENT AND PLAN:   SYNCOPE:   He has tachybrady syndrome.  PPM is indicated.  He was seen today by Dr. Royann Shivers who reviewed in detail pacing and he scheduled for placement.    ATRIAL FIB:    [name] has a CHA2DS2 - VASc of 5.  He will continue meds as listed.  SLEEP APNEA:   He is followed by Dr. Tresa Endo.    MITRAL VALVE REPAIR:      He has stable valve repair.  No change in therapy.  [name]  with the results and send results to Mechele Claude, MD   HTN:  The blood pressure is at target.  NO change in therapy.       Follow up me after pacing.   Signed, Rollene Rotunda, MD

## 2022-09-16 ENCOUNTER — Ambulatory Visit: Payer: Medicare Other | Attending: Cardiology | Admitting: Cardiology

## 2022-09-16 ENCOUNTER — Encounter: Payer: Self-pay | Admitting: Cardiology

## 2022-09-16 VITALS — BP 112/48 | HR 56 | Ht 71.0 in | Wt 194.6 lb

## 2022-09-16 DIAGNOSIS — Z9889 Other specified postprocedural states: Secondary | ICD-10-CM | POA: Diagnosis not present

## 2022-09-16 DIAGNOSIS — R55 Syncope and collapse: Secondary | ICD-10-CM | POA: Diagnosis not present

## 2022-09-16 DIAGNOSIS — I10 Essential (primary) hypertension: Secondary | ICD-10-CM | POA: Diagnosis not present

## 2022-09-16 NOTE — Progress Notes (Signed)
Asked by Dr. Antoine Poche to evaluate this patient for permanent pacemaker implantation. He has a longstanding history of paroxysmal atrial fibrillation that later became persistent and has been treated as permanent atrial fibrillation for the last approximately 6 years, after failing repeat attempts at maintenance of sinus rhythm. Additional medical problems are mitral valve repair with annuloplasty ring in 2003 (his very first episode of atrial fibrillation was immediately postop).  Follow-up echocardiogram performed just a few days ago showed normal left ventricular function, no evidence of mitral insufficiency, no evidence of mitral stenosis, borderline pulmonary hypertension.  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  He had minor coronary plaque at the time of preoperative cardiac catheterization 2003 or at a follow-up cardiac catheterization in 2008 (false positive SPECT scan). He has had 2 recent syncopal episodes.  Outpatient arrhythmia monitor shows atrial fibrillation with slow ventricular response and lengthy pauses up to 7 seconds in duration. He has normal left ventricular systolic function.  He is on chronic anticoagulation with Eliquis. He is not taking any medications with AV node blocking effects or any medications with negative chronotropic effect. He does have obstructive sleep apnea and wears CPAP every night, although he often is unable to use it throughout the entire duration of the night.  BP (!) 112/48 (BP Location: Left Arm, Patient Position: Sitting, Cuff Size: Normal)   Pulse (!) 56   Ht 5\' 11"  (1.803 m)   Wt 194 lb 9.6 oz (88.3 kg)   SpO2 95%   BMI 27.14 kg/m    General: Alert, oriented x3, no distress, appears very fit, younger than stated age Head: no evidence of trauma, PERRL, EOMI, no exophtalmos or lid lag, no myxedema, no xanthelasma; normal ears, nose and oropharynx Neck: normal jugular venous pulsations and no  hepatojugular reflux; brisk carotid pulses without delay and no carotid bruits Chest: clear to auscultation, no signs of consolidation by percussion or palpation, normal fremitus, symmetrical and full respiratory excursions Cardiovascular: normal position and quality of the apical impulse, irregular rhythm, normal first and second heart sounds, no murmurs, rubs or gallops Abdomen: no tenderness or distention, no masses by palpation, no abnormal pulsatility or arterial bruits, normal bowel sounds, no hepatosplenomegaly Extremities: no clubbing, cyanosis or edema; 2+ radial, ulnar and brachial pulses bilaterally; 2+ right femoral, posterior tibial and dorsalis pedis pulses; 2+ left femoral, posterior tibial and dorsalis pedis pulses; no subclavian or femoral bruits Neurological: grossly nonfocal Psych: Normal mood and affect  Atrial fibrillation with slow ventricular response and lengthy pauses while in excess of 5 seconds on arrhythmia monitor.  He meets criteria for implantation of a single-chamber permanent pacemaker.  The central benefits of pacemakers both in regards to longevity and quality of life were discussed.  Also reviewed the technique of pacemaker implantation and possible complications including lead dislodgment and need for reoperation, perforation, bleeding/tamponade, pneumothorax/chest tube insertion, infection and need for device removal, etc.  Discussed long-term monitoring.  They live in a area with poor cell phone signal and we may have to perform intermittent checks rather than have continuous remote monitoring.  This procedure has been fully reviewed with the patient and written informed consent has been obtained.  Scheduled for 09/23/2022 at 3 PM.  Thurmon Fair, MD, [facility] [phone] office [phone] pager

## 2022-09-16 NOTE — Patient Instructions (Addendum)
Implantable Device Instructions    You are scheduled for a Permanent transvenous pacemaker (PPM) on Thursday, August 8 with Dr. Rachelle Hora Croitoru.  1. Pre procedure Lab testing: lab work today  2. Please arrive at the [facility] (Main Entrance A) at [facility]: [address] [place], [place] [address] at 2:00 PM (This time is 1.5 hour(s) before your procedure to ensure your preparation). Free valet parking service is available. You will check in at ADMITTING. The support person will be asked to wait in the waiting room.  It is OK to have someone drop you off and come back when you are ready to be discharged.          Special note: Every effort is made to have your procedure done on time. Please understand that emergencies sometimes delay  scheduled procedures.  3.  No eating or drinking after midnight prior to procedure.     4.  Medication instructions:   TAKE THE LAST DOSE OF ELIQUIS TUESDAY EVENING 09/21/22      DO NOT TAKE METFORMIN THE MORNING OF THE PROCEDURE  5.  The night before your procedure and the morning of your procedure scrub your neck/chest with CHG surgical scrub.  See instruction letter.  6. Plan to go home the same day, you will only stay overnight if medically necessary. 7. You MUST have a responsible adult to drive you home. 8. An adult MUST be with you the first 24 hours after you arrive home. 9. Bring a current list of your medications, and the last time and date medication taken. 10. Bring ID and current insurance cards. 11. Please wear clothes that are easy to get on and off and wear slip-on shoes.    You will follow up with the [facility] Device clinic 10-14 days after your procedure.  You will follow up with Dr. Rachelle Hora Croitoru 91 days after your procedure.  These appointments will be made for you.   * If you have ANY questions after you get home, please call the office at [phone] or send a MyChart message.  FYI: For your safety, and to allow  [place] to monitor your vital signs accurately during the surgery/procedure we request that if you have artificial nails, gel coating, SNS etc. Please have those removed prior to your surgery/procedure. Not having the nail coverings /polish removed may result in cancellation or delay of your surgery/procedure.    [place] - Preparing For Surgery    Before surgery, you can play an important role. Because skin is not sterile, your skin needs to be as free of germs as possible. You can reduce the number of germs on your skin by washing with CHG (chlorahexidine gluconate) Soap before surgery.  CHG is an antiseptic cleaner which kills germs and bonds with the skin to continue killing germs even after washing.  Please do not use if you have an allergy to CHG or antibacterial soaps.  If your skin becomes reddened/irritated stop using the CHG.   Do not shave (including legs and underarms) for at least 48 hours prior to first CHG shower.  It is OK to shave your face.  Please follow these instructions carefully:  1.  Shower the night before surgery and the morning of surgery with CHG.  2.  If you choose to wash your hair, wash your hair first as usual with your normal shampoo.  3.  After you shampoo, rinse your hair and body thoroughly to remove the shampoo.  4.  Use CHG as you would any other liquid soap.  You can apply CHG directly to the skin and wash gently with a clean washcloth. 5.  Apply the CHG Soap to your body ONLY FROM THE NECK DOWN.  Do not use on open wounds or open sores.  Avoid contact with your eyes, ears, mouth and genitals (private parts).  Wash genitals (private parts) with your normal soap.  6.  Wash thoroughly, paying special attention to the area where your surgery will be performed.  7.  Thoroughly rinse your body with warm water from the neck down.   8.  DO NOT shower/wash with your normal soap after using and rinsing off the CHG soap.  9.  Pat yourself dry with a clean towel.            10.  Wear clean pajamas.           11.  Place clean sheets on your bed the night of your first shower and do not sleep with pets.  Day of Surgery: Do not apply any deodorants/lotions.  Please wear clean clothes to the hospital/surgery center.   Your physician recommends that you schedule a follow-up appointment in: as scheduled with Dr Antoine Poche

## 2022-09-23 ENCOUNTER — Ambulatory Visit (HOSPITAL_COMMUNITY): Payer: Medicare Other

## 2022-09-23 ENCOUNTER — Ambulatory Visit (HOSPITAL_COMMUNITY)
Admission: RE | Disposition: A | Discharge status: Home / Self Care | Payer: Self-pay | Source: Ambulatory Visit | Attending: Cardiovascular Disease

## 2022-09-23 ENCOUNTER — Other Ambulatory Visit: Payer: Self-pay

## 2022-09-23 ENCOUNTER — Ambulatory Visit (HOSPITAL_COMMUNITY)
Admission: RE | Admit: 2022-09-23 | Discharge: 2022-09-23 | Disposition: A | Discharge status: Home / Self Care | Payer: Medicare Other | Source: Ambulatory Visit | Attending: Cardiovascular Disease | Admitting: Cardiovascular Disease

## 2022-09-23 DIAGNOSIS — I495 Sick sinus syndrome: Secondary | ICD-10-CM | POA: Diagnosis not present

## 2022-09-23 DIAGNOSIS — I10 Essential (primary) hypertension: Secondary | ICD-10-CM | POA: Insufficient documentation

## 2022-09-23 DIAGNOSIS — G4730 Sleep apnea, unspecified: Secondary | ICD-10-CM | POA: Diagnosis not present

## 2022-09-23 DIAGNOSIS — R001 Bradycardia, unspecified: Secondary | ICD-10-CM

## 2022-09-23 DIAGNOSIS — R9389 Abnormal findings on diagnostic imaging of other specified body structures: Secondary | ICD-10-CM | POA: Diagnosis not present

## 2022-09-23 DIAGNOSIS — I4811 Longstanding persistent atrial fibrillation: Secondary | ICD-10-CM

## 2022-09-23 DIAGNOSIS — R55 Syncope and collapse: Secondary | ICD-10-CM | POA: Diagnosis not present

## 2022-09-23 DIAGNOSIS — Z950 Presence of cardiac pacemaker: Secondary | ICD-10-CM | POA: Diagnosis not present

## 2022-09-23 DIAGNOSIS — I4891 Unspecified atrial fibrillation: Secondary | ICD-10-CM | POA: Diagnosis not present

## 2022-09-23 HISTORY — PX: PACEMAKER IMPLANT: EP1218

## 2022-09-23 LAB — GLUCOSE, CAPILLARY
Glucose-Capillary: 108 mg/dL — ABNORMAL HIGH (ref 70–99)
Glucose-Capillary: 94 mg/dL (ref 70–99)

## 2022-09-23 SURGERY — PACEMAKER IMPLANT

## 2022-09-23 MED ORDER — MIDAZOLAM HCL 2 MG/2ML IJ SOLN
INTRAMUSCULAR | Status: AC
  Filled 2022-09-23: qty 2

## 2022-09-23 MED ORDER — POVIDONE-IODINE 10 % EX SWAB
2.0000 | Freq: Once | CUTANEOUS | Status: AC
  Administered 2022-09-23: 2 via TOPICAL

## 2022-09-23 MED ORDER — LIDOCAINE HCL (PF) 1 % IJ SOLN
INTRAMUSCULAR | Status: AC
  Filled 2022-09-23: qty 60

## 2022-09-23 MED ORDER — FENTANYL CITRATE (PF) 100 MCG/2ML IJ SOLN
INTRAMUSCULAR | Status: DC | PRN
  Administered 2022-09-23 (×2): 25 ug via INTRAVENOUS

## 2022-09-23 MED ORDER — SODIUM CHLORIDE 0.9 % IV SOLN: INTRAVENOUS | Status: DC

## 2022-09-23 MED ORDER — ONDANSETRON HCL 4 MG/2ML IJ SOLN: 4.0000 mg | Freq: Four times a day (QID) | INTRAMUSCULAR | Status: DC | PRN

## 2022-09-23 MED ORDER — SODIUM CHLORIDE 0.9 % IV SOLN: 80.0000 mg | INTRAVENOUS | Status: AC

## 2022-09-23 MED ORDER — LIDOCAINE HCL (PF) 1 % IJ SOLN
INTRAMUSCULAR | Status: DC | PRN
  Administered 2022-09-23: 50 mL

## 2022-09-23 MED ORDER — SODIUM CHLORIDE 0.9 % IV SOLN
INTRAVENOUS | Status: AC
  Administered 2022-09-23: 80 mg
  Filled 2022-09-23: qty 2

## 2022-09-23 MED ORDER — SODIUM CHLORIDE 0.9% FLUSH: 3.0000 mL | INTRAVENOUS | Status: DC | PRN

## 2022-09-23 MED ORDER — APIXABAN 5 MG PO TABS: 5.0000 mg | ORAL_TABLET | Freq: Two times a day (BID) | ORAL | 3 refills | Status: DC

## 2022-09-23 MED ORDER — CEFAZOLIN SODIUM-DEXTROSE 2-4 GM/100ML-% IV SOLN
INTRAVENOUS | Status: AC
  Administered 2022-09-23: 2 g via INTRAVENOUS
  Filled 2022-09-23: qty 100

## 2022-09-23 MED ORDER — SODIUM CHLORIDE 0.9 % IV SOLN: INTRAVENOUS | Status: DC | PRN

## 2022-09-23 MED ORDER — FENTANYL CITRATE (PF) 100 MCG/2ML IJ SOLN
INTRAMUSCULAR | Status: AC
  Filled 2022-09-23: qty 2

## 2022-09-23 MED ORDER — MIDAZOLAM HCL 5 MG/5ML IJ SOLN
INTRAMUSCULAR | Status: DC | PRN
  Administered 2022-09-23 (×2): 1 mg via INTRAVENOUS

## 2022-09-23 MED ORDER — CEFAZOLIN SODIUM-DEXTROSE 2-4 GM/100ML-% IV SOLN: 2.0000 g | INTRAVENOUS | Status: AC

## 2022-09-23 MED ORDER — ACETAMINOPHEN 325 MG PO TABS: 325.0000 mg | ORAL_TABLET | ORAL | Status: DC | PRN

## 2022-09-23 MED ORDER — SODIUM CHLORIDE 0.9% FLUSH: 3.0000 mL | Freq: Two times a day (BID) | INTRAVENOUS | Status: DC

## 2022-09-23 SURGICAL SUPPLY — 6 items
CABLE SURGICAL S-101-97-12 (CABLE) ×2 IMPLANT
LEAD INGEVITY 7842 59 (Lead) IMPLANT
PACEMAKER ACCOLADE SR (Pacemaker) IMPLANT
PAD DEFIB RADIO PHYSIO CONN (PAD) ×2 IMPLANT
SHEATH 7FR PRELUDE SNAP 13 (SHEATH) IMPLANT
TRAY PACEMAKER INSERTION (PACKS) ×2 IMPLANT

## 2022-09-23 NOTE — Interval H&P Note (Signed)
History and Physical Interval Note:  09/23/2022 1:47 PM  [name]  has presented today for surgery, with the diagnosis of afib.  The various methods of treatment have been discussed with the patient and family. After consideration of risks, benefits and other options for treatment, the patient has consented to  Procedure(s): PACEMAKER IMPLANT (N/A) as a surgical intervention.  The patient's history has been reviewed, patient examined, no change in status, stable for surgery.  I have reviewed the patient's chart and labs.  Questions were answered to the patient's satisfaction.      

## 2022-09-23 NOTE — Discharge Instructions (Addendum)
After Your Cardiac Device   You have a [employer] Cardiac Device ( [place])  ACTIVITY Do not lift your arm above shoulder height for 1 week after your procedure. After 7 days, you may progress as below.  You should remove your sling 24 hours after your procedure, unless otherwise instructed by your provider.     Thursday September 30, 2022  Friday October 01, 2022 Saturday October 02, 2022 Sunday October 03, 2022   Do not lift, push, pull, or carry anything over 10 pounds with the affected arm until 6 weeks (Thursday November 04, 2022 ) after your procedure.   You may drive AFTER your wound check, unless you have been told otherwise by your provider.   Ask your healthcare provider when you can go back to work   INCISION/Dressing If you are on a blood thinner such as Coumadin, Xarelto, Eliquis, Plavix, or Pradaxa please confirm with your provider when this should be resumed.   If large square, outer bandage is left in place, this can be removed after 24 hours from your procedure. Do not remove steri-strips or glue as below.   If a PRESSURE DRESSING (a bulky dressing that usually goes up over your shoulder) was applied or left in place, please follow instructions given by your provider on when to return to have this removed.   Monitor your cardiac device site for redness, swelling, and drainage. Call the device clinic at [phone] if you experience these symptoms or fever/chills.  If your incision is sealed with Steri-strips or staples, you may shower 7 days after your procedure or when told by your provider. Do not remove the steri-strips or let the shower hit directly on your site. You may wash around your site with soap and water.    If you were discharged in a sling, please do not wear this during the day more than 48 hours after your surgery unless otherwise instructed. This may increase the risk of stiffness and soreness in your shoulder.   Avoid lotions, ointments, or perfumes  over your incision until it is well-healed.  You may use a hot tub or a pool AFTER your wound check appointment if the incision is completely closed.   DEVICE MANAGEMENT You should receive your ID card for your new device in 4-8 weeks. Keep this card with you at all times once received. Consider wearing a medical alert bracelet or necklace.  Please follow manufacture instructions for keeping your device charged carefully. This should be performed every 2 weeks at the very least.   Your cardiac device may be MRI compatible. This will be discussed at your next office visit/wound check.  You should avoid contact with strong electric or magnetic fields.   Do not use amateur (ham) radio equipment or electric (arc) welding torches. MP3 player headphones with magnets should not be used. Some devices are safe to use if held at least 12 inches (30 cm) from your cardiac device. These include power tools, lawn mowers, and speakers. If you are unsure if something is safe to use, ask your health care provider.  When using your cell phone, hold it to the ear that is on the opposite side from the cardiac device. Do not leave your cell phone in a pocket over the cardiac device.  You may safely use electric blankets, heating pads, computers, and microwave ovens.  Call the office right away if: You have chest pain. You feel more short of breath than you have felt before. You  feel more light-headed than you have felt before. Your incision starts to open up.  This information is not intended to replace advice given to you by your health care provider. Make sure you discuss any questions you have with your health care provider.

## 2022-09-23 NOTE — Op Note (Signed)
Procedure report  Procedure performed:  1. Implantation of new single chamber permanent pacemaker  2. Fluoroscopy  3.  Moderate sedation    Reason for procedure:  Symptomatic bradycardia and syncope due to: Atrial fibrillation with slow ventricular response  Procedure performed by: Thurmon Fair, MD  Complications: None  Estimated blood loss: <10 mL  Medications administered during procedure: Ancef 2 g intravenously Lidocaine 1% 30 mL locally,  Fentanyl 50 mcg intravenously Versed 2 mg intravenously  During this procedure the patient is administered a total of Versed 2 mg and Fentanyl 50 mcg to achieve and maintain moderate conscious sedation.  The patient's heart rate, blood pressure, and oxygen saturation are monitored continuously during the procedure. The period of conscious sedation is 50 minutes, of which I was present face-to-face 100% of this time.   Device details: Industrial/product designer Accolade MRI model L3 10 serial number [id number] Right ventricular lead [employer] Ingevity model 784 2-59 cm serial number [id number]  Procedure details:  After the risks and benefits of the procedure were discussed the patient provided informed consent and was brought to the cardiac cath lab in the fasting state. The patient was prepped and draped in usual sterile fashion. Local anesthesia with 1% lidocaine was administered to to the left infraclavicular area. A 5-6 cm horizontal incision was made parallel with and 2-3 cm caudal to the left clavicle. Using electrocautery and blunt dissection a prepectoral pocket was created down to the level of the pectoralis major muscle fascia. The pocket was carefully inspected for hemostasis. An antibiotic-soaked sponge was placed in the pocket.  Under fluoroscopic guidance and using the modified Seldinger technique a single venipuncture was performed to access the left subclavian vein.  No difficulty was encountered accessing the vein.   Two J-tip guidewires were subsequently exchanged for a 7 French safe sheaths.  Under fluoroscopic guidance the ventricular lead was advanced to level of the mid to apical right ventricular septum and thet active-fixation helix was deployed. Prominent current of injury was seen. Satisfactory pacing and sensing parameters were recorded. There was no evidence of diaphragmatic stimulation at maximum device output. The safe sheath was peeled away and the lead was secured in place with 2-0 silk.  The antibiotic-soaked sponge was removed from the pocket. The pocket was flushed with copious amounts of antibiotic solution. Reinspection showed excellent hemostasis..  The ventricular lead was connected to the generator and appropriate ventricular pacing was seen.  Telemetry testing of lead parameters showed stable findings.  The entire system was then carefully inserted in the pocket with care been taking that the leads and device assumed a comfortable position without pressure on the incision. Great care was taken that the leads be located deep to the generator. The pocket was then closed in layers using 2 layers of 2-0 Vicryl, 1 layer of 3-0 Vicryl and cutaneous Steri-Strips, after which a sterile dressing was applied.  At the end of the procedure the following lead parameters were encountered:   Right ventricular lead sensed R waves 20.2 mV, impedance 794 ohms, threshold 0.4 V at 0.4 ms pulse width.   Thurmon Fair, MD, [facility] [phone] office [phone] pager  Cc: Rollene Rotunda, MD

## 2022-09-24 ENCOUNTER — Encounter (HOSPITAL_COMMUNITY): Payer: Self-pay | Admitting: Cardiovascular Disease

## 2022-09-24 MED FILL — Midazolam HCl Inj 2 MG/2ML (Base Equivalent): INTRAMUSCULAR | Qty: 2 | Status: AC

## 2022-09-27 ENCOUNTER — Telehealth: Payer: Self-pay

## 2022-09-27 NOTE — Telephone Encounter (Signed)
Pt wife called in stating pt has broken out with a rash at ppm site. Pt is unable to send a picture to email or mychart and she wants to know what to do

## 2022-09-27 NOTE — Telephone Encounter (Signed)
Unable to send photo.   Spoke to patent who advised he removed the outer dressing (clear telpha w/ 4x4 under) and notes redness to skin where dressing was. Pt reports redness has not worsened and possibly has improved. Advised patient he may use 1% hydrocortisone topically to area avoiding incision area or steri-strips. Advised to apply several times and day if worsens to call the device clinic. Pt voiced understanding.

## 2022-09-27 NOTE — Telephone Encounter (Signed)
Following alert received from [employer] received for possible loss of RV capture.   Reviewed with Dr. Royann Shivers via secure chat, who advised it does NOT look like loss of RV capture. It just looks like a beat of transmitted atrial fibrillation or a PVC.Continue to monitor.

## 2022-10-04 DIAGNOSIS — G4733 Obstructive sleep apnea (adult) (pediatric): Secondary | ICD-10-CM | POA: Diagnosis not present

## 2022-10-06 NOTE — Patient Instructions (Signed)
   After Your Pacemaker   Monitor your pacemaker site for redness, swelling, and drainage. Call the device clinic at [phone] if you experience these symptoms or fever/chills.  Your incision was closed with Steri-strips or staples:  You may shower 7 days after your procedure and wash your incision with soap and water. Avoid lotions, ointments, or perfumes over your incision until it is well-healed.  You may use a hot tub or a pool after your wound check appointment if the incision is completely closed.  Do not lift, push or pull greater than 10 pounds with the affected arm until 6 weeks after your procedure. UNTIL AFTER SEPTEMBER 19TH.  There are no other restrictions in arm movement after your wound check appointment.  You may drive, unless driving has been restricted by your healthcare providers.  Remote monitoring is used to monitor your pacemaker from home. This monitoring is scheduled every 91 days by our office. It allows [place] to keep an eye on the functioning of your device to ensure it is working properly. You will routinely see your Electrophysiologist annually (more often if necessary).

## 2022-10-07 ENCOUNTER — Ambulatory Visit: Payer: Medicare Other | Attending: Cardiovascular Disease

## 2022-10-07 DIAGNOSIS — R55 Syncope and collapse: Secondary | ICD-10-CM | POA: Diagnosis not present

## 2022-10-07 LAB — CUP PACEART INCLINIC DEVICE CHECK
Brady Statistic RV Percent Paced: 74 %
Date Time Interrogation Session: 20240822182743
Implantable Lead Connection Status: 753985
Implantable Lead Implant Date: 20240808
Implantable Lead Location: 753860
Implantable Lead Model: 7842
Implantable Lead Serial Number: 1314359
Implantable Pulse Generator Implant Date: 20240808
Lead Channel Impedance Value: 678 Ohm
Lead Channel Pacing Threshold Amplitude: 0.7 V
Lead Channel Pacing Threshold Pulse Width: 0.4 ms
Lead Channel Sensing Intrinsic Amplitude: 25 mV
Lead Channel Setting Pacing Amplitude: 3.5 V
Lead Channel Setting Pacing Pulse Width: 0.4 ms
Lead Channel Setting Sensing Sensitivity: 3.5 mV
Pulse Gen Serial Number: 907855
Zone Setting Status: 755011

## 2022-10-07 NOTE — Progress Notes (Signed)
Wound check appointment. Steri-strips removed. Wound without redness or edema. Incision edges approximated, wound well healed. Normal device function. Thresholds, sensing, and impedances consistent with implant measurements. Device programmed at 3.5V/auto capture programmed on for extra safety margin until 3 month visit. Histogram distribution appropriate for patient and level of activity. No high ventricular rates noted. Patient educated about wound care, arm mobility, lifting restrictions. ROV in 3 months with implanting physician. 

## 2022-10-21 NOTE — Progress Notes (Signed)
Cardiology Office Note:   Date:  10/22/2022  ID:  [name], DOB 25-Nov-1935, MRN [id number] PCP: Mechele Claude, MD  [facility] Providers Cardiologist:  Rollene Rotunda, MD {  History of Present Illness:   [name] is a 87 y.o. male who presents for evaluation of syncope.  He has a history of  mitral valve repair.  Echo 10/2015 demonstrated stable MV repair.  While in the hospital in 2017 for knee surgery he developed persistent atrial fib.  He had cardioversion but went back into atrial fib.  He wore a Holter monitor which demonstrated frequent 3.5 second pauses at night. His average heart rate was about 80.   He is treated for sleep apnea.  In 2019 he had some double vision and was told he could have had a "small stroke."  His INR was therapeutic. Follow up echo was normal.  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  He was also seen by neurology.       He had a work up for syncope    He wore a monitor and had atrial fib with slow ventricular rate.  This has demonstrated frequent ventricular pauses of 3 to almost 7 seconds.  One of these was during the day while awake.  He is now status post pacemaker.    He said that he is felt well.  He has not been having dizzy episodes.  He certainly had none of the syncope that he was having. The patient denies any new symptoms such as chest discomfort, neck or arm discomfort. There has been no new shortness of breath, PND or orthopnea. There have been no reported palpitations, presyncope or syncope.   ROS: As stated in the HPI and negative for all other systems.  Studies Reviewed:    EKG:   NA  Risk Assessment/Calculations:    CHA2DS2-VASc Score = 6  This indicates a 9.7% annual risk of stroke. The patient's score is based upon: CHF History: 0 HTN History: 1 Diabetes History: 1 Stroke History: 2 Vascular Disease History: 0 Age Score: 2 Gender Score: 0   Physical  Exam:   VS:  BP (!) 142/70 (BP Location: Left Arm, Patient Position: Sitting, Cuff Size: Normal)   Pulse (!) 56   Ht 5\' 11"  (1.803 m)   Wt 194 lb 3.2 oz (88.1 kg)   SpO2 96%   BMI 27.09 kg/m    Wt Readings from Last 3 Encounters:  10/22/22 194 lb 3.2 oz (88.1 kg)  09/23/22 190 lb (86.2 kg)  09/16/22 194 lb 9.6 oz (88.3 kg)     GEN: Well nourished, well developed in no acute distress NECK: No JVD; No carotid bruits CARDIAC: Irregular RR, no murmurs, rubs, gallops RESPIRATORY:  Clear to auscultation without rales, wheezing or rhonchi  ABDOMEN: Soft, non-tender, non-distended EXTREMITIES:  No edema; No deformity   ASSESSMENT AND PLAN:   SYNCOPE:   He is status post PPM.    No change in therapy.  He has had no further syncope. ATRIAL FIB:   He tolerates anticoagulation.   [name] has a CHA2DS2 - VASc of 5.  He will continue meds as listed.   SLEEP APNEA:   He he wears CPAP    he is followed by Dr. Tresa Endo.   MV: He had stable repair on echo in July and I will probably repeat an echo in a couple of years.  Follow up with me in one year.   Signed, Rollene Rotunda, MD

## 2022-10-22 ENCOUNTER — Encounter: Payer: Self-pay | Admitting: Cardiology

## 2022-10-22 ENCOUNTER — Ambulatory Visit: Payer: Medicare Other | Attending: Cardiology | Admitting: Cardiology

## 2022-10-22 VITALS — BP 142/70 | HR 56 | Ht 71.0 in | Wt 194.2 lb

## 2022-10-22 DIAGNOSIS — R55 Syncope and collapse: Secondary | ICD-10-CM | POA: Diagnosis not present

## 2022-10-22 DIAGNOSIS — I482 Chronic atrial fibrillation, unspecified: Secondary | ICD-10-CM

## 2022-10-22 NOTE — Patient Instructions (Signed)
Medication Instructions:  Medication Instructions:     *If you need a refill on your cardiac medications before your next appointment, please call your pharmacy*   Lab Work:    If you have labs (blood work) drawn today and your tests are completely normal, you will receive your results only by: MyChart Message (if you have MyChart) OR A paper copy in the mail If you have any lab test that is abnormal or we need to change your treatment, we will call you to review the results.   Testing/Procedures:    Follow-Up: At [facility], you and your health needs are our priority.  As part of our continuing mission to provide you with exceptional heart care, we have created designated Provider Care Teams.  These Care Teams include your primary Cardiologist (physician) and Advanced Practice Providers (APPs -  Physician Assistants and Nurse Practitioners) who all work together to provide you with the care you need, when you need it.  We recommend signing up for the patient portal called "MyChart".  Sign up information is provided on this After Visit Summary.  MyChart is used to connect with patients for Virtual Visits (Telemedicine).  Patients are able to view lab/test results, encounter notes, upcoming appointments, etc.  Non-urgent messages can be sent to your provider as well.   To learn more about what you can do with MyChart, go to [email or web address].    Your next appointment:   1 year(s)  The format for your next appointment:   In Person  Provider:   Rollene Rotunda, MD    Other Instructions    *If you need a refill on your cardiac medications before your next appointment, please call your pharmacy*   Lab Work:    If you have labs (blood work) drawn today and your tests are completely normal, you will receive your results only by: MyChart Message (if you have MyChart) OR A paper copy in the mail If you have any lab test that is abnormal or we need to change your  treatment, we will call you to review the results.   Testing/Procedures:    Follow-Up: At [facility], you and your health needs are our priority.  As part of our continuing mission to provide you with exceptional heart care, we have created designated Provider Care Teams.  These Care Teams include your primary Cardiologist (physician) and Advanced Practice Providers (APPs -  Physician Assistants and Nurse Practitioners) who all work together to provide you with the care you need, when you need it.  We recommend signing up for the patient portal called "MyChart".  Sign up information is provided on this After Visit Summary.  MyChart is used to connect with patients for Virtual Visits (Telemedicine).  Patients are able to view lab/test results, encounter notes, upcoming appointments, etc.  Non-urgent messages can be sent to your provider as well.   To learn more about what you can do with MyChart, go to [email or web address].    Your next appointment:   1 year(s)  The format for your next appointment:   In Person  Provider:   Rollene Rotunda, MD

## 2022-10-26 ENCOUNTER — Ambulatory Visit (INDEPENDENT_AMBULATORY_CARE_PROVIDER_SITE_OTHER): Payer: Medicare Other | Admitting: Family Medicine

## 2022-10-26 ENCOUNTER — Encounter: Payer: Self-pay | Admitting: Family Medicine

## 2022-10-26 VITALS — BP 117/65 | HR 50 | Temp 97.0°F | Ht 71.0 in | Wt 192.8 lb

## 2022-10-26 DIAGNOSIS — E1140 Type 2 diabetes mellitus with diabetic neuropathy, unspecified: Secondary | ICD-10-CM

## 2022-10-26 DIAGNOSIS — N401 Enlarged prostate with lower urinary tract symptoms: Secondary | ICD-10-CM | POA: Diagnosis not present

## 2022-10-26 DIAGNOSIS — Z7984 Long term (current) use of oral hypoglycemic drugs: Secondary | ICD-10-CM | POA: Diagnosis not present

## 2022-10-26 DIAGNOSIS — I10 Essential (primary) hypertension: Secondary | ICD-10-CM | POA: Diagnosis not present

## 2022-10-26 DIAGNOSIS — E785 Hyperlipidemia, unspecified: Secondary | ICD-10-CM | POA: Diagnosis not present

## 2022-10-26 DIAGNOSIS — R350 Frequency of micturition: Secondary | ICD-10-CM

## 2022-10-26 LAB — BAYER DCA HB A1C WAIVED: HB A1C (BAYER DCA - WAIVED): 5.8 % — ABNORMAL HIGH (ref 4.8–5.6)

## 2022-10-26 MED ORDER — LISINOPRIL 20 MG PO TABS: ORAL_TABLET | ORAL | 1 refills | Status: DC

## 2022-10-26 MED ORDER — SILODOSIN 4 MG PO CAPS: 4.0000 mg | ORAL_CAPSULE | Freq: Every day | ORAL | 3 refills | Status: DC

## 2022-10-26 NOTE — Progress Notes (Signed)
Subjective:  Patient ID: [name],  male    DOB: February 09, 1936  Age: 87 y.o.    CC: Medical Management of Chronic Issues   HPI [name] presents for  follow-up of hypertension. Patient has no history of headache chest pain or shortness of breath or recent cough. Patient also denies symptoms of TIA such as numbness weakness lateralizing. Patient denies side effects from medication. States taking it regularly.  Patient also  in for follow-up of elevated cholesterol. Doing well without complaints on current medication. Denies side effects  including myalgia and arthralgia and nausea. Also in today for liver function testing. Currently no chest pain, shortness of breath or other cardiovascular related symptoms noted.  Follow-up of diabetes. Patient does check blood sugar at home. Readings run between 109 and 142 Patient denies symptoms such as excessive hunger or urinary frequency, excessive hunger, nausea No significant hypoglycemic spells noted. Medications reviewed. Pt reports taking them regularly. Pt. denies complication/adverse reaction today.   Frequency of urinatio controlled by med.   History [name] has a past medical history of Arthritis, Atrial fibrillation (HCC), BPH (benign prostatic hyperplasia), Cataracts, bilateral, Coronary artery disease excluded, Diabetes mellitus without complication (HCC), Diverticulosis, Endocarditis, valve unspecified, unspecified cause, GERD (gastroesophageal reflux disease), H/O mitral valve repair, Hearing loss, Heart murmur, Hiatal hernia (06/13/2002), High cholesterol, History of kidney stones, OSA on CPAP, Stroke (HCC), Unspecified essential hypertension, Weakness of both arms (07/19/2012), and Wears dentures.   He has a past surgical history that includes Vasectomy; Mitral valve repair (~ 2003); Cataract extraction w/PHACO (Right, 05/12/2015); Cataract extraction w/PHACO (Left, 06/12/2015); Total knee arthroplasty (Left, 02/20/2016);  Injection knee (Right, 02/20/2016); Cardioversion (N/A, 04/14/2016); Laparoscopic cholecystectomy; Knee arthroscopy (Left); Joint replacement; Shoulder open rotator cuff repair (Left); Colonoscopy; Upper gi endoscopy; Multiple tooth extractions; Cardiac catheterization (02/14/2007); Shoulder arthroscopy with rotator cuff repair and open biceps tenodesis (Right, 01/26/2019); Eye surgery; Total knee arthroplasty (Right, 07/06/2019); Esophagogastroduodenoscopy (N/A, 05/21/2021); ERCP (N/A, 05/21/2021); maloney dilation (N/A, 05/21/2021); sphincterotomy (05/21/2021); removal of stones (05/21/2021); Esophageal dilation (N/A, 05/21/2021); ERCP (N/A, 05/21/2021); Colonoscopy with propofol (N/A, 01/21/2022); Esophagogastroduodenoscopy (egd) with propofol (N/A, 01/21/2022); Esophageal dilation (01/21/2022); biopsy (01/21/2022); and PACEMAKER IMPLANT (N/A, 09/23/2022).   His family history includes Congestive Heart Failure in his father; Diabetes Mellitus II in his brother and sister; Heart disease in his father; Lung cancer in his brother; Stroke in his brother, brother, sister, and sister.He reports that he quit smoking about 34 years ago. His smoking use included cigarettes. He started smoking about 69 years ago. He has a 105 pack-year smoking history. He has been exposed to tobacco smoke. He has never used smokeless tobacco. He reports that he does not drink alcohol and does not use drugs.  Current Outpatient Medications on File Prior to Visit  Medication Sig Dispense Refill   apixaban (ELIQUIS) 5 MG TABS tablet Take 1 tablet (5 mg total) by mouth 2 (two) times daily. 180 tablet 3   Bacillus Coagulans-Inulin (ALIGN PREBIOTIC-PROBIOTIC PO) Take 1 capsule by mouth daily.     Blood Glucose Monitoring Suppl DEVI 1 each by Does not apply route in the morning, at noon, and at bedtime. May substitute to any manufacturer covered by patient's insurance. 1 each 0   Calcium Carb-Cholecalciferol (CALCIUM 600 + D PO) Take 1 tablet by mouth daily.      clobetasol cream (TEMOVATE) 0.05 % Apply 1 Application topically 2 (two) times daily as needed (Psoriasis).     Coenzyme Q10 300 MG CAPS Take 300  mg by mouth daily.     colestipol (COLESTID) 1 g tablet Take 4 tablets (4 g total) by mouth daily. take it 4 hours apart from his other medications 360 tablet 3   finasteride (PROSCAR) 5 MG tablet Take 5 mg by mouth daily.     fluticasone (CUTIVATE) 0.05 % cream Apply 1 Application topically 2 (two) times daily as needed (Psoriasis).     folic acid (FOLVITE) 1 MG tablet Take 1 mg by mouth daily.     Glucose Blood (BLOOD GLUCOSE TEST STRIPS) STRP 1 each by In Vitro route in the morning, at noon, and at bedtime. May substitute to any manufacturer covered by patient's insurance. 100 strip PRN   metFORMIN (GLUCOPHAGE-XR) 500 MG 24 hr tablet TAKE 1 TABLET BY MOUTH EVERY DAY WITH BREAKFAST 90 tablet 3   methotrexate (RHEUMATREX) 2.5 MG tablet Take 5 mg by mouth once a week.     Multiple Vitamin (MULTIVITAMIN WITH MINERALS) TABS tablet Take 1 tablet by mouth daily.     omeprazole (PRILOSEC) 40 MG capsule Take 1 capsule (40 mg total) by mouth daily. 90 capsule 3   rosuvastatin (CRESTOR) 5 MG tablet Take 1 tablet (5 mg total) by mouth daily. For cholesterol 90 tablet 3   triamcinolone cream (KENALOG) 0.1 % Apply 1 Application topically daily as needed (Psoriasis).     No current facility-administered medications on file prior to visit.    ROS Review of Systems  Constitutional:  Negative for fever.  Respiratory:  Negative for shortness of breath.   Cardiovascular:  Negative for chest pain.  Musculoskeletal:  Negative for arthralgias.  Skin:  Negative for rash.    Objective:  BP 117/65   Pulse (!) 50   Temp (!) 97 F (36.1 C)   Ht 5\' 11"  (1.803 m)   Wt 192 lb 12.8 oz (87.5 kg)   SpO2 97%   BMI 26.89 kg/m   BP Readings from Last 3 Encounters:  10/26/22 117/65  10/22/22 (!) 142/70  09/23/22 135/64    Wt Readings from Last 3 Encounters:   10/26/22 192 lb 12.8 oz (87.5 kg)  10/22/22 194 lb 3.2 oz (88.1 kg)  09/23/22 190 lb (86.2 kg)     Physical Exam Vitals reviewed.  Constitutional:      Appearance: He is well-developed.  HENT:     Head: Normocephalic and atraumatic.     Right Ear: External ear normal.     Left Ear: External ear normal.     Mouth/Throat:     Pharynx: No oropharyngeal exudate or posterior oropharyngeal erythema.  Eyes:     Pupils: Pupils are equal, round, and reactive to light.  Cardiovascular:     Rate and Rhythm: Normal rate and regular rhythm.     Heart sounds: No murmur heard. Pulmonary:     Effort: No respiratory distress.     Breath sounds: Normal breath sounds.  Musculoskeletal:     Cervical back: Normal range of motion and neck supple.  Neurological:     Mental Status: He is alert and oriented to person, place, and time.     Diabetic Foot Exam - Simple   Simple Foot Form Diabetic Foot exam was performed with the following findings: Yes 10/26/2022  8:48 AM  Visual Inspection See comments: Yes Sensation Testing See comments: Yes Pulse Check Posterior Tibialis and Dorsalis pulse intact bilaterally: Yes Comments Decreased light touch at dorsum of right 1st toe. Two small, healing abrasions, one at each ankle.  Lab Results  Component Value Date   HGBA1C 5.6 07/22/2022   HGBA1C 6.1 (H) 04/19/2022   HGBA1C 5.6 10/20/2021    Assessment & Plan:   [name] was seen today for medical management of chronic issues.  Diagnoses and all orders for this visit:  Type 2 diabetes mellitus with diabetic neuropathy, unspecified whether long term insulin use (HCC) -     Bayer DCA Hb A1c Waived  Essential hypertension -     CBC with Differential/Platelet -     CMP14+EGFR  Dyslipidemia -     Lipid panel  Benign prostatic hyperplasia with urinary frequency  Other orders -     lisinopril (ZESTRIL) 20 MG tablet; TAKE 1/2 TABLET BY MOUTH EVERYDAY AT BEDTIME -     silodosin  (RAPAFLO) 4 MG CAPS capsule; Take 1 capsule (4 mg total) by mouth daily with breakfast.   I am having [name] maintain his multivitamin with minerals, Calcium Carb-Cholecalciferol (CALCIUM 600 + D PO), Coenzyme Q10, methotrexate, folic acid, clobetasol cream, fluticasone, triamcinolone cream, metFORMIN, rosuvastatin, finasteride, colestipol, omeprazole, Blood Glucose Monitoring Suppl, BLOOD GLUCOSE TEST STRIPS, Bacillus Coagulans-Inulin (ALIGN PREBIOTIC-PROBIOTIC PO), apixaban, lisinopril, and silodosin.  Meds ordered this encounter  Medications   lisinopril (ZESTRIL) 20 MG tablet    Sig: TAKE 1/2 TABLET BY MOUTH EVERYDAY AT BEDTIME    Dispense:  90 tablet    Refill:  1   silodosin (RAPAFLO) 4 MG CAPS capsule    Sig: Take 1 capsule (4 mg total) by mouth daily with breakfast.    Dispense:  90 capsule    Refill:  3     Follow-up: Return in about 3 months (around 01/25/2023).  Mechele Claude, M.D.

## 2022-10-27 ENCOUNTER — Other Ambulatory Visit: Payer: Self-pay | Admitting: *Deleted

## 2022-10-27 DIAGNOSIS — R7989 Other specified abnormal findings of blood chemistry: Secondary | ICD-10-CM

## 2022-10-27 LAB — CBC WITH DIFFERENTIAL/PLATELET
Basophils Absolute: 0 10*3/uL (ref 0.0–0.2)
Basos: 0 %
EOS (ABSOLUTE): 0.1 10*3/uL (ref 0.0–0.4)
Eos: 3 %
Hematocrit: 42.8 % (ref 37.5–51.0)
Hemoglobin: 14.1 g/dL (ref 13.0–17.7)
Immature Grans (Abs): 0 10*3/uL (ref 0.0–0.1)
Immature Granulocytes: 1 %
Lymphocytes Absolute: 1 10*3/uL (ref 0.7–3.1)
Lymphs: 21 %
MCH: 32.9 pg (ref 26.6–33.0)
MCHC: 32.9 g/dL (ref 31.5–35.7)
MCV: 100 fL — ABNORMAL HIGH (ref 79–97)
Monocytes Absolute: 0.5 10*3/uL (ref 0.1–0.9)
Monocytes: 11 %
Neutrophils Absolute: 3.1 10*3/uL (ref 1.4–7.0)
Neutrophils: 64 %
Platelets: 282 10*3/uL (ref 150–450)
RBC: 4.29 x10E6/uL (ref 4.14–5.80)
RDW: 12.6 % (ref 11.6–15.4)
WBC: 4.9 10*3/uL (ref 3.4–10.8)

## 2022-10-27 LAB — CMP14+EGFR
ALT: 31 IU/L (ref 0–44)
AST: 19 IU/L (ref 0–40)
Albumin: 4.3 g/dL (ref 3.7–4.7)
Alkaline Phosphatase: 188 IU/L — ABNORMAL HIGH (ref 44–121)
BUN/Creatinine Ratio: 20 (ref 10–24)
BUN: 17 mg/dL (ref 8–27)
Bilirubin Total: 0.7 mg/dL (ref 0.0–1.2)
CO2: 25 mmol/L (ref 20–29)
Calcium: 9 mg/dL (ref 8.6–10.2)
Chloride: 103 mmol/L (ref 96–106)
Creatinine, Ser: 0.83 mg/dL (ref 0.76–1.27)
Globulin, Total: 2.6 g/dL (ref 1.5–4.5)
Glucose: 118 mg/dL — ABNORMAL HIGH (ref 70–99)
Potassium: 4.3 mmol/L (ref 3.5–5.2)
Sodium: 143 mmol/L (ref 134–144)
Total Protein: 6.9 g/dL (ref 6.0–8.5)
eGFR: 86 mL/min/{1.73_m2} (ref >59)

## 2022-10-27 LAB — LIPID PANEL
Chol/HDL Ratio: 2.3 ratio (ref 0.0–5.0)
Cholesterol, Total: 121 mg/dL (ref 100–199)
HDL: 52 mg/dL (ref >39)
LDL Chol Calc (NIH): 55 mg/dL (ref 0–99)
Triglycerides: 70 mg/dL (ref 0–149)
VLDL Cholesterol Cal: 14 mg/dL (ref 5–40)

## 2022-11-01 ENCOUNTER — Other Ambulatory Visit: Payer: Self-pay | Admitting: Family Medicine

## 2022-11-02 ENCOUNTER — Other Ambulatory Visit (HOSPITAL_COMMUNITY)
Admission: RE | Admit: 2022-11-02 | Discharge: 2022-11-02 | Disposition: A | Discharge status: Home / Self Care | Payer: Medicare Other | Source: Ambulatory Visit | Attending: Dermatology | Admitting: Dermatology

## 2022-11-02 DIAGNOSIS — I10 Essential (primary) hypertension: Secondary | ICD-10-CM | POA: Diagnosis not present

## 2022-11-02 LAB — CBC WITH DIFFERENTIAL/PLATELET
Abs Immature Granulocytes: 0.03 10*3/uL (ref 0.00–0.07)
Basophils Absolute: 0 10*3/uL (ref 0.0–0.1)
Basophils Relative: 0 %
Eosinophils Absolute: 0.1 10*3/uL (ref 0.0–0.5)
Eosinophils Relative: 2 %
HCT: 41.3 % (ref 39.0–52.0)
Hemoglobin: 13.9 g/dL (ref 13.0–17.0)
Immature Granulocytes: 1 %
Lymphocytes Relative: 20 %
Lymphs Abs: 1.1 10*3/uL (ref 0.7–4.0)
MCH: 33 pg (ref 26.0–34.0)
MCHC: 33.7 g/dL (ref 30.0–36.0)
MCV: 98.1 fL (ref 80.0–100.0)
Monocytes Absolute: 0.6 10*3/uL (ref 0.1–1.0)
Monocytes Relative: 11 %
Neutro Abs: 3.6 10*3/uL (ref 1.7–7.7)
Neutrophils Relative %: 66 %
Platelets: 277 10*3/uL (ref 150–400)
RBC: 4.21 MIL/uL — ABNORMAL LOW (ref 4.22–5.81)
RDW: 13.3 % (ref 11.5–15.5)
WBC: 5.5 10*3/uL (ref 4.0–10.5)
nRBC: 0 % (ref 0.0–0.2)

## 2022-11-02 LAB — COMPREHENSIVE METABOLIC PANEL
ALT: 24 U/L (ref 0–44)
AST: 20 U/L (ref 15–41)
Albumin: 4 g/dL (ref 3.5–5.0)
Alkaline Phosphatase: 116 U/L (ref 38–126)
Anion gap: 11 (ref 5–15)
BUN: 13 mg/dL (ref 8–23)
CO2: 27 mmol/L (ref 22–32)
Calcium: 8.9 mg/dL (ref 8.9–10.3)
Chloride: 102 mmol/L (ref 98–111)
Creatinine, Ser: 0.73 mg/dL (ref 0.61–1.24)
GFR, Estimated: >60 mL/min (ref >60)
Glucose, Bld: 113 mg/dL — ABNORMAL HIGH (ref 70–99)
Potassium: 4.1 mmol/L (ref 3.5–5.1)
Sodium: 140 mmol/L (ref 135–145)
Total Bilirubin: 1 mg/dL (ref 0.3–1.2)
Total Protein: 7.6 g/dL (ref 6.5–8.1)

## 2022-11-04 DIAGNOSIS — L400 Psoriasis vulgaris: Secondary | ICD-10-CM | POA: Diagnosis not present

## 2022-11-10 ENCOUNTER — Other Ambulatory Visit: Payer: Medicare Other

## 2022-11-10 DIAGNOSIS — R7989 Other specified abnormal findings of blood chemistry: Secondary | ICD-10-CM

## 2022-11-11 LAB — HEPATIC FUNCTION PANEL
ALT: 14 IU/L (ref 0–44)
AST: 18 IU/L (ref 0–40)
Albumin: 4.5 g/dL (ref 3.7–4.7)
Alkaline Phosphatase: 94 IU/L (ref 44–121)
Bilirubin Total: 0.8 mg/dL (ref 0.0–1.2)
Bilirubin, Direct: 0.31 mg/dL (ref 0.00–0.40)
Total Protein: 7.1 g/dL (ref 6.0–8.5)

## 2022-11-23 ENCOUNTER — Other Ambulatory Visit (HOSPITAL_COMMUNITY)
Admission: RE | Admit: 2022-11-23 | Discharge: 2022-11-23 | Disposition: A | Discharge status: Home / Self Care | Payer: Medicare Other | Source: Ambulatory Visit | Attending: Dermatology | Admitting: Dermatology

## 2022-11-23 DIAGNOSIS — L400 Psoriasis vulgaris: Secondary | ICD-10-CM | POA: Insufficient documentation

## 2022-11-23 LAB — COMPREHENSIVE METABOLIC PANEL
ALT: 16 U/L (ref 0–44)
AST: 14 U/L — ABNORMAL LOW (ref 15–41)
Albumin: 4.2 g/dL (ref 3.5–5.0)
Alkaline Phosphatase: 71 U/L (ref 38–126)
Anion gap: 10 (ref 5–15)
BUN: 14 mg/dL (ref 8–23)
CO2: 26 mmol/L (ref 22–32)
Calcium: 8.4 mg/dL — ABNORMAL LOW (ref 8.9–10.3)
Chloride: 101 mmol/L (ref 98–111)
Creatinine, Ser: 0.86 mg/dL (ref 0.61–1.24)
GFR, Estimated: >60 mL/min (ref >60)
Glucose, Bld: 97 mg/dL (ref 70–99)
Potassium: 3.6 mmol/L (ref 3.5–5.1)
Sodium: 137 mmol/L (ref 135–145)
Total Bilirubin: 1 mg/dL (ref 0.3–1.2)
Total Protein: 7.6 g/dL (ref 6.5–8.1)

## 2022-11-23 LAB — CBC WITH DIFFERENTIAL/PLATELET
Abs Immature Granulocytes: 0.01 10*3/uL (ref 0.00–0.07)
Basophils Absolute: 0 10*3/uL (ref 0.0–0.1)
Basophils Relative: 1 %
Eosinophils Absolute: 0.1 10*3/uL (ref 0.0–0.5)
Eosinophils Relative: 2 %
HCT: 41.3 % (ref 39.0–52.0)
Hemoglobin: 14.1 g/dL (ref 13.0–17.0)
Immature Granulocytes: 0 %
Lymphocytes Relative: 20 %
Lymphs Abs: 1.3 10*3/uL (ref 0.7–4.0)
MCH: 33.2 pg (ref 26.0–34.0)
MCHC: 34.1 g/dL (ref 30.0–36.0)
MCV: 97.2 fL (ref 80.0–100.0)
Monocytes Absolute: 0.6 10*3/uL (ref 0.1–1.0)
Monocytes Relative: 9 %
Neutro Abs: 4.5 10*3/uL (ref 1.7–7.7)
Neutrophils Relative %: 68 %
Platelets: 206 10*3/uL (ref 150–400)
RBC: 4.25 MIL/uL (ref 4.22–5.81)
RDW: 13.6 % (ref 11.5–15.5)
WBC: 6.5 10*3/uL (ref 4.0–10.5)
nRBC: 0 % (ref 0.0–0.2)

## 2022-11-23 LAB — LIPID PANEL
Cholesterol: 105 mg/dL (ref 0–200)
HDL: 57 mg/dL (ref >40)
LDL Cholesterol: 26 mg/dL (ref 0–99)
Total CHOL/HDL Ratio: 1.8 {ratio}
Triglycerides: 111 mg/dL (ref <150)
VLDL: 22 mg/dL (ref 0–40)

## 2022-11-26 LAB — QUANTIFERON-TB GOLD PLUS: QuantiFERON-TB Gold Plus: NEGATIVE

## 2022-11-26 LAB — QUANTIFERON-TB GOLD PLUS (RQFGPL)
QuantiFERON Mitogen Value: >10 [IU]/mL
QuantiFERON Nil Value: 0.02 [IU]/mL
QuantiFERON TB1 Ag Value: 0.02 [IU]/mL
QuantiFERON TB2 Ag Value: 0.02 [IU]/mL

## 2022-12-09 ENCOUNTER — Encounter: Payer: Self-pay | Admitting: Cardiovascular Disease

## 2022-12-09 ENCOUNTER — Ambulatory Visit: Payer: Medicare Other | Attending: Cardiovascular Disease | Admitting: Cardiovascular Disease

## 2022-12-09 VITALS — BP 110/58 | HR 52 | Ht 71.0 in | Wt 195.0 lb

## 2022-12-09 DIAGNOSIS — E118 Type 2 diabetes mellitus with unspecified complications: Secondary | ICD-10-CM | POA: Diagnosis not present

## 2022-12-09 DIAGNOSIS — Z7901 Long term (current) use of anticoagulants: Secondary | ICD-10-CM | POA: Diagnosis not present

## 2022-12-09 DIAGNOSIS — Z9889 Other specified postprocedural states: Secondary | ICD-10-CM

## 2022-12-09 DIAGNOSIS — I4811 Longstanding persistent atrial fibrillation: Secondary | ICD-10-CM | POA: Diagnosis not present

## 2022-12-09 DIAGNOSIS — G4733 Obstructive sleep apnea (adult) (pediatric): Secondary | ICD-10-CM | POA: Diagnosis not present

## 2022-12-09 DIAGNOSIS — I10 Essential (primary) hypertension: Secondary | ICD-10-CM

## 2022-12-09 DIAGNOSIS — Z950 Presence of cardiac pacemaker: Secondary | ICD-10-CM

## 2022-12-09 NOTE — Patient Instructions (Signed)
Medication Instructions:  *If you need a refill on your cardiac medications before your next appointment, please call your pharmacy*  Follow-Up: At [facility], you and your health needs are our priority.  As part of our continuing mission to provide you with exceptional heart care, we have created designated Provider Care Teams.  These Care Teams include your primary Cardiologist (physician) and Advanced Practice Providers (APPs -  Physician Assistants and Nurse Practitioners) who all work together to provide you with the care you need, when you need it.  We recommend signing up for the patient portal called "MyChart".  Sign up information is provided on this After Visit Summary.  MyChart is used to connect with patients for Virtual Visits (Telemedicine).  Patients are able to view lab/test results, encounter notes, upcoming appointments, etc.  Non-urgent messages can be sent to your provider as well.   To learn more about what you can do with MyChart, go to [email or web address].    Your next appointment:   4 month(s)  Dr. Nicki Guadalajara   You may contact Johnnye Sima for any sleep care concerns at [phone]

## 2022-12-09 NOTE — Progress Notes (Signed)
Sent order for new machine and supplies to [employer] 12/09/22

## 2022-12-18 ENCOUNTER — Encounter: Payer: Self-pay | Admitting: Cardiovascular Disease

## 2022-12-24 ENCOUNTER — Ambulatory Visit (INDEPENDENT_AMBULATORY_CARE_PROVIDER_SITE_OTHER): Payer: Medicare Other

## 2022-12-24 DIAGNOSIS — Z9889 Other specified postprocedural states: Secondary | ICD-10-CM

## 2022-12-29 ENCOUNTER — Ambulatory Visit: Payer: Medicare Other

## 2022-12-29 DIAGNOSIS — Z9889 Other specified postprocedural states: Secondary | ICD-10-CM | POA: Diagnosis not present

## 2022-12-30 LAB — CUP PACEART REMOTE DEVICE CHECK
Battery Remaining Longevity: 114 mo
Battery Remaining Percentage: 100 %
Brady Statistic RV Percent Paced: 63 %
Date Time Interrogation Session: 20241113124200
Implantable Lead Connection Status: 753985
Implantable Lead Implant Date: 20240808
Implantable Lead Location: 753860
Implantable Lead Model: 7842
Implantable Lead Serial Number: 1314359
Implantable Pulse Generator Implant Date: 20240808
Lead Channel Impedance Value: 659 Ohm
Lead Channel Setting Pacing Amplitude: 3.5 V
Lead Channel Setting Pacing Pulse Width: 0.4 ms
Lead Channel Setting Sensing Sensitivity: 3.5 mV
Pulse Gen Serial Number: 907855
Zone Setting Status: 755011

## 2023-01-04 NOTE — Progress Notes (Signed)
Remote pacemaker transmission.   

## 2023-01-06 ENCOUNTER — Ambulatory Visit: Payer: Medicare Other | Attending: Cardiovascular Disease | Admitting: Cardiovascular Disease

## 2023-01-06 ENCOUNTER — Encounter: Payer: Self-pay | Admitting: Cardiovascular Disease

## 2023-01-06 VITALS — BP 112/62 | HR 52 | Ht 71.0 in | Wt 199.6 lb

## 2023-01-06 DIAGNOSIS — I4729 Other ventricular tachycardia: Secondary | ICD-10-CM

## 2023-01-06 DIAGNOSIS — G4733 Obstructive sleep apnea (adult) (pediatric): Secondary | ICD-10-CM

## 2023-01-06 DIAGNOSIS — D6869 Other thrombophilia: Secondary | ICD-10-CM

## 2023-01-06 DIAGNOSIS — R7303 Prediabetes: Secondary | ICD-10-CM

## 2023-01-06 DIAGNOSIS — I4891 Unspecified atrial fibrillation: Secondary | ICD-10-CM

## 2023-01-06 DIAGNOSIS — I700 Atherosclerosis of aorta: Secondary | ICD-10-CM | POA: Diagnosis not present

## 2023-01-06 DIAGNOSIS — Z950 Presence of cardiac pacemaker: Secondary | ICD-10-CM | POA: Diagnosis not present

## 2023-01-06 DIAGNOSIS — I10 Essential (primary) hypertension: Secondary | ICD-10-CM | POA: Diagnosis not present

## 2023-01-06 DIAGNOSIS — Z9889 Other specified postprocedural states: Secondary | ICD-10-CM | POA: Diagnosis not present

## 2023-01-06 DIAGNOSIS — E7800 Pure hypercholesterolemia, unspecified: Secondary | ICD-10-CM

## 2023-01-06 NOTE — Progress Notes (Signed)
Cardiology Office Note:  .   Date:  01/06/2023  ID:  [name], DOB 27-Apr-1935, MRN [id number] PCP: Mechele Claude, MD  [facility] Providers Cardiologist:  Rollene Rotunda, MD    History of Present Illness: .   [name] is a 87 y.o. male with history of mitral valve annuloplasty repair (2003), preserved left ventricular systolic function and no residual mitral insufficiency, minimal coronary atherosclerosis (cardiac catheterization for false positive nuclear stress test 2008), OSA on CPAP, hypercholesterolemia, essential hypertension, remote history of embolic brainstem stroke, permanent atrial fibrillation who had fatigue and syncope due to recurrent episodes of slow ventricular response and pauses and underwent single-chamber pacemaker implantation 09/23/2022 [facility] Scientific Accolade MRI EL 310).  He returns today for his 71-month follow-up.    He has been feeling much more energetic" wishes that he done this sooner".  He has not had any near-syncope or syncopal events.  He denies dizziness or palpitations.  The pacemaker site has healed well without swelling or tenderness or drainage.  He is eager to return to full activity.  He denies exertional dyspnea or angina, edema, claudication or focal neurological events.  He has not had any falls or bleeding and he is fully anticoagulated with Eliquis 5 mg twice daily.  Pacemaker interrogation shows normal device function.  The generator is at beginning of life and after reducing the pacing output to chronic levels today (2.0V@ 0.4 ms) the estimated generator longevity is 10 years.  Lead parameters are excellent (R waves> 25 mV, impedance 670 ohm, threshold 0.7 V at 0.4 ms).  He has roughly 63% ventricular pacing with a decent heart rate histogram distribution.  No changes were made to the baseline rate response sensor settings today.  The device has recorded 3 episodes of high ventricular rate since implantation.  2 of these appear  to be atrial fibrillation rapid ventricular response, but one of them is probably nonsustained VT consisting of roughly 15 consecutive beats.  ROS: As above  Studies Reviewed: Marland Kitchen        Comprehensive in office pacemaker check performed by me today including pacemaker output amplitude change. Risk Assessment/Calculations:    CHA2DS2-VASc Score = 6   This indicates a 9.7% annual risk of stroke. The patient's score is based upon: CHF History: 0 HTN History: 1 Diabetes History: 0 Stroke History: 2 Vascular Disease History: 1 Age Score: 2 Gender Score: 0            Physical Exam:   VS:  BP 112/62 (BP Location: Left Arm, Patient Position: Sitting, Cuff Size: Normal)   Pulse (!) 52   Ht 5\' 11"  (1.803 m)   Wt 199 lb 9.6 oz (90.5 kg)   SpO2 96%   BMI 27.84 kg/m    Wt Readings from Last 3 Encounters:  01/06/23 199 lb 9.6 oz (90.5 kg)  12/09/22 195 lb (88.5 kg)  10/26/22 192 lb 12.8 oz (87.5 kg)    GEN: Well nourished, well developed in no acute distress.  Left subclavian pacemaker site is well-healed. NECK: No JVD; No carotid bruits CARDIAC: Normal S1, paradoxically split S2 with ventricular pacing RRR, no murmurs, rubs, gallops RESPIRATORY:  Clear to auscultation without rales, wheezing or rhonchi  ABDOMEN: Soft, non-tender, non-distended EXTREMITIES:  No edema; No deformity   ASSESSMENT AND PLAN: .   Permanent atrial fibrillation with slow ventricular response: Appropriately anticoagulated.  Adequate rate control.  Very rare RVR. Pacemaker: Well-healed device site.  Normal device function.  Outputs  decreased to chronic levels today.  We were concerned that his overall home location will prevent remote downloads, but we successfully received a remote download from him last week.  Will follow-up in the office on a yearly basis. Anticoagulation: No bleeding complications on full dose Eliquis.  He does have a history of stroke.  CHA2DS2-VASc score at least 6. Hx MV repair: Most  recent echocardiogram shows no residual MR and preserved LV function. Coronary/aortic atherosclerosis: He does not have any symptoms of CAD.  He is on appropriate lipid-lowering therapy. NSVT: 3 episodes of high ventricular response were recorded by his device in last 3 months, one of them was concerning for nonsustained VT.  Will monitor.  No indication for medical therapy at this point. HTN: Well-controlled. HLP: Excellent lipid profile last month with an LDL cholesterol of 26 and HDL 57. Prediabetes: Note borderline hemoglobin A1c 5.8% in September.  Now that he is able to return to full activity hopefully will see that improved. OSA: Reports compliance with CPAP for most of the night.       Dispo: Continue remote downloads every 3 months.  Yearly office visit.  Signed, Thurmon Fair, MD  [facility] [phone] office [phone] pager

## 2023-01-06 NOTE — Patient Instructions (Signed)

## 2023-01-20 DIAGNOSIS — L400 Psoriasis vulgaris: Secondary | ICD-10-CM | POA: Diagnosis not present

## 2023-01-20 NOTE — Progress Notes (Signed)
Remote pacemaker transmission.   

## 2023-01-26 ENCOUNTER — Ambulatory Visit: Payer: Medicare Other | Admitting: Family Medicine

## 2023-01-26 ENCOUNTER — Encounter: Payer: Self-pay | Admitting: Family Medicine

## 2023-01-26 VITALS — BP 113/59 | HR 50 | Temp 97.5°F | Ht 71.0 in | Wt 192.6 lb

## 2023-01-26 DIAGNOSIS — Z7984 Long term (current) use of oral hypoglycemic drugs: Secondary | ICD-10-CM

## 2023-01-26 DIAGNOSIS — E1140 Type 2 diabetes mellitus with diabetic neuropathy, unspecified: Secondary | ICD-10-CM

## 2023-01-26 DIAGNOSIS — E785 Hyperlipidemia, unspecified: Secondary | ICD-10-CM

## 2023-01-26 DIAGNOSIS — I10 Essential (primary) hypertension: Secondary | ICD-10-CM

## 2023-01-26 LAB — CMP14+EGFR
ALT: 9 [IU]/L (ref 0–44)
AST: 14 [IU]/L (ref 0–40)
Albumin: 4.3 g/dL (ref 3.7–4.7)
Alkaline Phosphatase: 71 [IU]/L (ref 44–121)
BUN/Creatinine Ratio: 18 (ref 10–24)
BUN: 15 mg/dL (ref 8–27)
Bilirubin Total: 0.9 mg/dL (ref 0.0–1.2)
CO2: 26 mmol/L (ref 20–29)
Calcium: 9 mg/dL (ref 8.6–10.2)
Chloride: 101 mmol/L (ref 96–106)
Creatinine, Ser: 0.85 mg/dL (ref 0.76–1.27)
Globulin, Total: 2.5 g/dL (ref 1.5–4.5)
Glucose: 121 mg/dL — ABNORMAL HIGH (ref 70–99)
Potassium: 4 mmol/L (ref 3.5–5.2)
Sodium: 141 mmol/L (ref 134–144)
Total Protein: 6.8 g/dL (ref 6.0–8.5)
eGFR: 85 mL/min/{1.73_m2} (ref >59)

## 2023-01-26 LAB — LIPID PANEL
Chol/HDL Ratio: 1.8 {ratio} (ref 0.0–5.0)
Cholesterol, Total: 101 mg/dL (ref 100–199)
HDL: 57 mg/dL (ref >39)
LDL Chol Calc (NIH): 32 mg/dL (ref 0–99)
Triglycerides: 48 mg/dL (ref 0–149)
VLDL Cholesterol Cal: 12 mg/dL (ref 5–40)

## 2023-01-26 LAB — BAYER DCA HB A1C WAIVED: HB A1C (BAYER DCA - WAIVED): 5.7 % — ABNORMAL HIGH (ref 4.8–5.6)

## 2023-01-26 LAB — CBC WITH DIFFERENTIAL/PLATELET
Basophils Absolute: 0 10*3/uL (ref 0.0–0.2)
Basos: 1 %
EOS (ABSOLUTE): 0.1 10*3/uL (ref 0.0–0.4)
Eos: 2 %
Hematocrit: 42.2 % (ref 37.5–51.0)
Hemoglobin: 14.2 g/dL (ref 13.0–17.7)
Immature Grans (Abs): 0 10*3/uL (ref 0.0–0.1)
Immature Granulocytes: 0 %
Lymphocytes Absolute: 1.2 10*3/uL (ref 0.7–3.1)
Lymphs: 24 %
MCH: 32.9 pg (ref 26.6–33.0)
MCHC: 33.6 g/dL (ref 31.5–35.7)
MCV: 98 fL — ABNORMAL HIGH (ref 79–97)
Monocytes Absolute: 0.6 10*3/uL (ref 0.1–0.9)
Monocytes: 12 %
Neutrophils Absolute: 2.9 10*3/uL (ref 1.4–7.0)
Neutrophils: 61 %
Platelets: 225 10*3/uL (ref 150–450)
RBC: 4.32 x10E6/uL (ref 4.14–5.80)
RDW: 12.8 % (ref 11.6–15.4)
WBC: 4.8 10*3/uL (ref 3.4–10.8)

## 2023-01-26 NOTE — Progress Notes (Signed)
Subjective:  Patient ID: [name],  male    DOB: October 30, 1935  Age: 87 y.o.    CC: Medical Management of Chronic Issues   HPI [name] presents for  follow-up of hypertension. Patient has no history of headache chest pain or shortness of breath or recent cough. Patient also denies symptoms of TIA such as numbness weakness lateralizing. Patient denies side effects from medication. States taking it regularly.  Patient also  in for follow-up of elevated cholesterol. Doing well without complaints on current medication. Denies side effects  including myalgia and arthralgia and nausea. Also in today for liver function testing. Currently no chest pain, shortness of breath or other cardiovascular related symptoms noted.  Follow-up of diabetes. Patient does check blood sugar at home. Readings run between 120 and 150 Patient denies symptoms such as excessive hunger or urinary frequency, excessive hunger, nausea No significant hypoglycemic spells noted. Medications reviewed. [name] reports taking them regularly. [name]. denies complication/adverse reaction today.    History [name] has a past medical history of Arthritis, Atrial fibrillation (HCC), BPH (benign prostatic hyperplasia), Cataracts, bilateral, Coronary artery disease excluded, Diabetes mellitus without complication (HCC), Diverticulosis, Endocarditis, valve unspecified, unspecified cause, GERD (gastroesophageal reflux disease), H/O mitral valve repair, Hearing loss, Heart murmur, Hiatal hernia (06/13/2002), High cholesterol, History of kidney stones, OSA on CPAP, Stroke (HCC), Unspecified essential hypertension, Weakness of both arms (07/19/2012), and Wears dentures.   He has a past surgical history that includes Vasectomy; Mitral valve repair (~ 2003); Cataract extraction w/PHACO (Right, 05/12/2015); Cataract extraction w/PHACO (Left, 06/12/2015); Total knee arthroplasty (Left, 02/20/2016); Injection knee (Right, 02/20/2016); Cardioversion  (N/A, 04/14/2016); Laparoscopic cholecystectomy; Knee arthroscopy (Left); Joint replacement; Shoulder open rotator cuff repair (Left); Colonoscopy; Upper gi endoscopy; Multiple tooth extractions; Cardiac catheterization (02/14/2007); Shoulder arthroscopy with rotator cuff repair and open biceps tenodesis (Right, 01/26/2019); Eye surgery; Total knee arthroplasty (Right, 07/06/2019); Esophagogastroduodenoscopy (N/A, 05/21/2021); ERCP (N/A, 05/21/2021); maloney dilation (N/A, 05/21/2021); sphincterotomy (05/21/2021); removal of stones (05/21/2021); Esophageal dilation (N/A, 05/21/2021); ERCP (N/A, 05/21/2021); Colonoscopy with propofol (N/A, 01/21/2022); Esophagogastroduodenoscopy (egd) with propofol (N/A, 01/21/2022); Esophageal dilation (01/21/2022); biopsy (01/21/2022); and PACEMAKER IMPLANT (N/A, 09/23/2022).   His family history includes Congestive Heart Failure in his father; Diabetes Mellitus II in his brother and sister; Heart disease in his father; Lung cancer in his brother; Stroke in his brother, brother, sister, and sister.He reports that he quit smoking about 34 years ago. His smoking use included cigarettes. He started smoking about 70 years ago. He has a 105 pack-year smoking history. He has been exposed to tobacco smoke. He has never used smokeless tobacco. He reports that he does not drink alcohol and does not use drugs.  Current Outpatient Medications on File Prior to Visit  Medication Sig Dispense Refill   Accu-Chek Softclix Lancets lancets Test BS in the morning, at noon and at bedtime Dx E11.40 300 each 3   apixaban (ELIQUIS) 5 MG TABS tablet Take 1 tablet (5 mg total) by mouth 2 (two) times daily. 180 tablet 3   Bacillus Coagulans-Inulin (ALIGN PREBIOTIC-PROBIOTIC PO) Take 1 capsule by mouth daily.     Blood Glucose Monitoring Suppl DEVI 1 each by Does not apply route in the morning, at noon, and at bedtime. May substitute to any manufacturer covered by patient's insurance. 1 each 0   Calcium  Carb-Cholecalciferol (CALCIUM 600 + D PO) Take 1 tablet by mouth daily.     clobetasol cream (TEMOVATE) 0.05 % Apply 1 Application topically 2 (two) times daily  as needed (Psoriasis).     Coenzyme Q10 300 MG CAPS Take 300 mg by mouth daily.     colestipol (COLESTID) 1 g tablet Take 4 tablets (4 g total) by mouth daily. take it 4 hours apart from his other medications 360 tablet 3   finasteride (PROSCAR) 5 MG tablet Take 5 mg by mouth daily.     fluticasone (CUTIVATE) 0.05 % cream Apply 1 Application topically 2 (two) times daily as needed (Psoriasis).     folic acid (FOLVITE) 1 MG tablet Take 1 mg by mouth daily.     Glucose Blood (BLOOD GLUCOSE TEST STRIPS) STRP 1 each by In Vitro route in the morning, at noon, and at bedtime. May substitute to any manufacturer covered by patient's insurance. 100 strip PRN   metFORMIN (GLUCOPHAGE-XR) 500 MG 24 hr tablet TAKE 1 TABLET BY MOUTH EVERY DAY WITH BREAKFAST 90 tablet 3   methotrexate (RHEUMATREX) 2.5 MG tablet Take 5 mg by mouth once a week.     Multiple Vitamin (MULTIVITAMIN WITH MINERALS) TABS tablet Take 1 tablet by mouth daily.     omeprazole (PRILOSEC) 40 MG capsule Take 1 capsule (40 mg total) by mouth daily. 90 capsule 3   rosuvastatin (CRESTOR) 5 MG tablet Take 1 tablet (5 mg total) by mouth daily. For cholesterol 90 tablet 3   silodosin (RAPAFLO) 4 MG CAPS capsule Take 1 capsule (4 mg total) by mouth daily with breakfast. 90 capsule 3   triamcinolone cream (KENALOG) 0.1 % Apply 1 Application topically daily as needed (Psoriasis).     No current facility-administered medications on file prior to visit.    ROS Review of Systems  Constitutional:  Negative for fever.  Respiratory:  Negative for shortness of breath.   Cardiovascular:  Negative for chest pain.  Musculoskeletal:  Negative for arthralgias.  Skin:  Negative for rash.    Objective:  BP (!) 113/59   Pulse (!) 50   Temp (!) 97.5 F (36.4 C)   Ht 5\' 11"  (1.803 m)   Wt 192 lb  9.6 oz (87.4 kg)   SpO2 97%   BMI 26.86 kg/m   BP Readings from Last 3 Encounters:  01/26/23 (!) 113/59  01/06/23 112/62  12/09/22 (!) 110/58    Wt Readings from Last 3 Encounters:  01/26/23 192 lb 9.6 oz (87.4 kg)  01/06/23 199 lb 9.6 oz (90.5 kg)  12/09/22 195 lb (88.5 kg)     Physical Exam Vitals reviewed.  Constitutional:      Appearance: He is well-developed.  HENT:     Head: Normocephalic and atraumatic.     Right Ear: External ear normal.     Left Ear: External ear normal.     Mouth/Throat:     Pharynx: No oropharyngeal exudate or posterior oropharyngeal erythema.  Eyes:     Pupils: Pupils are equal, round, and reactive to light.  Cardiovascular:     Rate and Rhythm: Normal rate and regular rhythm.     Heart sounds: No murmur heard. Pulmonary:     Effort: No respiratory distress.     Breath sounds: Normal breath sounds.  Musculoskeletal:     Cervical back: Normal range of motion and neck supple.  Neurological:     Mental Status: He is alert and oriented to person, place, and time.     Diabetic Foot Exam - Simple   No data filed     Lab Results  Component Value Date   HGBA1C 5.7 (H) 01/26/2023  HGBA1C 5.8 (H) 10/26/2022   HGBA1C 5.6 07/22/2022    Assessment & Plan:   Hartman was seen today for medical management of chronic issues.  Diagnoses and all orders for this visit:  Type 2 diabetes mellitus with diabetic neuropathy, unspecified whether long term insulin use (HCC) -     Bayer DCA Hb A1c Waived  Essential hypertension -     CBC with Differential/Platelet -     CMP14+EGFR  Dyslipidemia -     Lipid panel   I have discontinued Milano A. Sampsel's lisinopril. I am also having him maintain his multivitamin with minerals, Calcium Carb-Cholecalciferol (CALCIUM 600 + D PO), Coenzyme Q10, methotrexate, folic acid, clobetasol cream, fluticasone, triamcinolone cream, metFORMIN, rosuvastatin, finasteride, colestipol, omeprazole, Blood Glucose  Monitoring Suppl, BLOOD GLUCOSE TEST STRIPS, Bacillus Coagulans-Inulin (ALIGN PREBIOTIC-PROBIOTIC PO), apixaban, silodosin, and Accu-Chek Softclix Lancets.  No orders of the defined types were placed in this encounter.    Follow-up: No follow-ups on file.  Mechele Claude, M.D.

## 2023-01-27 NOTE — Progress Notes (Signed)
Hello Richard,  Your lab result is normal and/or stable.Some minor variations that are not significant are commonly marked abnormal, but do not represent any medical problem for you.  Best regards, Helon Wisinski, M.D.

## 2023-02-08 ENCOUNTER — Telehealth: Payer: Self-pay | Admitting: Cardiovascular Disease

## 2023-02-08 NOTE — Telephone Encounter (Signed)
Per wife the light on the box always blinks blue and it has stopped blinking. She is concerned

## 2023-02-08 NOTE — Telephone Encounter (Signed)
Spoke with wife and reassured her the monitor is fine. If the monitor was not connected we will reach out and she has verbalized understanding

## 2023-02-22 ENCOUNTER — Ambulatory Visit: Payer: Medicare Other

## 2023-02-22 VITALS — Ht 71.0 in | Wt 192.0 lb

## 2023-02-22 DIAGNOSIS — Z0000 Encounter for general adult medical examination without abnormal findings: Secondary | ICD-10-CM

## 2023-02-22 NOTE — Progress Notes (Signed)
 Subjective:   [name] is a 88 y.o. male who presents for Medicare Annual/Subsequent preventive examination.  Visit Complete: Virtual I connected with  [name] on 02/22/23 by a audio enabled telemedicine application and verified that I am speaking with the correct person using two identifiers.  Patient Location: Home  Provider Location: Home Office  I discussed the limitations of evaluation and management by telemedicine. The patient expressed understanding and agreed to proceed.  Vital Signs: Because this visit was a virtual/telehealth visit, some criteria may be missing or patient reported. Any vitals not documented were not able to be obtained and vitals that have been documented are patient reported.  Patient Medicare AWV questionnaire was completed by the patient on 02/18/23; I have confirmed that all information answered by patient is correct and no changes since this date.  Cardiac Risk Factors include: advanced age (>58men, >71 women);diabetes mellitus;dyslipidemia;hypertension;male gender     Objective:    Today's Vitals   02/22/23 1951  Weight: 192 lb (87.1 kg)  Height: 5' 11 (1.803 m)   Body mass index is 26.78 kg/m.     02/22/2023    7:57 PM 09/23/2022    2:15 PM 02/16/2022    2:55 PM 01/21/2022   11:41 AM 01/21/2022   11:31 AM 01/15/2022    9:26 AM 05/21/2021    4:22 PM  Advanced Directives  Does Patient Have a Medical Advance Directive? No Yes Yes No No No   Type of Special Educational Needs Teacher of [place];Living will Healthcare Power of [place];Living will    Healthcare Power of [place];Living will  Does patient want to make changes to medical advance directive?  No - Guardian declined       Copy of Healthcare Power of Attorney in Chart?  No - copy requested No - copy requested    No - copy requested  Would patient like information on creating a medical advance directive? Yes (MAU/Ambulatory/Procedural Areas - Information given)   No -  Patient declined No - Patient declined No - Patient declined     Current Medications (verified) Outpatient Encounter Medications as of 02/22/2023  Medication Sig   Accu-Chek Softclix Lancets lancets Test BS in the morning, at noon and at bedtime Dx E11.40   apixaban  (ELIQUIS ) 5 MG TABS tablet Take 1 tablet (5 mg total) by mouth 2 (two) times daily.   Bacillus Coagulans-Inulin (ALIGN PREBIOTIC-PROBIOTIC PO) Take 1 capsule by mouth daily.   Blood Glucose Monitoring Suppl DEVI 1 each by Does not apply route in the morning, at noon, and at bedtime. May substitute to any manufacturer covered by patient's insurance.   Calcium  Carb-Cholecalciferol (CALCIUM  600 + D PO) Take 1 tablet by mouth daily.   clobetasol  cream (TEMOVATE ) 0.05 % Apply 1 Application topically 2 (two) times daily as needed (Psoriasis).   Coenzyme Q10 300 MG CAPS Take 300 mg by mouth daily.   colestipol  (COLESTID ) 1 g tablet Take 4 tablets (4 g total) by mouth daily. take it 4 hours apart from his other medications   finasteride (PROSCAR) 5 MG tablet Take 5 mg by mouth daily.   fluticasone (CUTIVATE) 0.05 % cream Apply 1 Application topically 2 (two) times daily as needed (Psoriasis).   folic acid (FOLVITE) 1 MG tablet Take 1 mg by mouth daily.   Glucose Blood (BLOOD GLUCOSE TEST STRIPS) STRP 1 each by In Vitro route in the morning, at noon, and at bedtime. May substitute to any manufacturer covered by patient's  insurance.   metFORMIN  (GLUCOPHAGE -XR) 500 MG 24 hr tablet TAKE 1 TABLET BY MOUTH EVERY DAY WITH BREAKFAST   methotrexate (RHEUMATREX) 2.5 MG tablet Take 5 mg by mouth once a week.   Multiple Vitamin (MULTIVITAMIN WITH MINERALS) TABS tablet Take 1 tablet by mouth daily.   omeprazole  (PRILOSEC) 40 MG capsule Take 1 capsule (40 mg total) by mouth daily.   rosuvastatin  (CRESTOR ) 5 MG tablet Take 1 tablet (5 mg total) by mouth daily. For cholesterol   silodosin  (RAPAFLO ) 4 MG CAPS capsule Take 1 capsule (4 mg total) by mouth  daily with breakfast.   triamcinolone cream (KENALOG) 0.1 % Apply 1 Application topically daily as needed (Psoriasis).   No facility-administered encounter medications on file as of 02/22/2023.    Allergies (verified) Flomax [tamsulosin hcl]   History: Past Medical History:  Diagnosis Date   Arthritis    knees, shoulder,  hips (10/24/2017)   Atrial fibrillation (HCC)    on eliquis    BPH (benign prostatic hyperplasia)    Cataracts, bilateral    removed by surgery   Coronary artery disease excluded    Diabetes mellitus without complication (HCC)    type 2   Diverticulosis    Endocarditis, valve unspecified, unspecified cause    GERD (gastroesophageal reflux disease)    H/O mitral valve repair    Postoperative ring with postoperative atrial fibrillation   Hearing loss    both ears - does not use hearing aids   Heart murmur    hx   Hiatal hernia 06/13/2002   High cholesterol    History of kidney stones    passed spontaneously x2    OSA on CPAP    uses cpap nightly   Sleep apnea    Stroke [facility])    Unspecified essential hypertension    Weakness of both arms 07/19/2012   Wears dentures    upper and lower partial   Past Surgical History:  Procedure Laterality Date   BIOPSY  01/21/2022   Procedure: BIOPSY;  Surgeon: Eartha Angelia Sieving, MD;  [facility];  Service: Gastroenterology;;   CARDIAC CATHETERIZATION  02/14/2007   x 2   CARDIOVERSION N/A 04/14/2016   Procedure: CARDIOVERSION;  Surgeon: Lynwood Schilling, MD;  Location: [facility];  Service: Cardiovascular;  Laterality: N/A;   CATARACT EXTRACTION W/PHACO Right 05/12/2015   Procedure: CATARACT EXTRACTION PHACO AND INTRAOCULAR LENS PLACEMENT RIGHT EYE CDE=7.75;  Surgeon: Cherene Mania, MD;  Location: [facility] ORS;  Service: Ophthalmology;  Laterality: Right;   CATARACT EXTRACTION W/PHACO Left 06/12/2015   Procedure: CATARACT EXTRACTION PHACO AND INTRAOCULAR LENS PLACEMENT (IOC);  Surgeon: Cherene Mania, MD;  Location:  [facility];  Service: Ophthalmology;  Laterality: Left;  CDE:  13.17   COLONOSCOPY     COLONOSCOPY WITH PROPOFOL  N/A 01/21/2022   Procedure: COLONOSCOPY WITH PROPOFOL ;  Surgeon: Eartha Angelia Sieving, MD;  [facility];  Service: Gastroenterology;  Laterality: N/A;  115 ASA 3   ERCP N/A 05/21/2021   Procedure: ENDOSCOPIC RETROGRADE CHOLANGIOPANCREATOGRAPHY (ERCP);  Surgeon: Golda Claudis PENNER, MD;  Location: [facility];  Service: Gastroenterology;  Laterality: N/A;   ERCP N/A 05/21/2021   Procedure: ENDOSCOPIC RETROGRADE CHOLANGIOPANCREATOGRAPHY (ERCP);  Surgeon: Golda Claudis PENNER, MD;  Location: [facility];  Service: Gastroenterology;  Laterality: N/A;   ESOPHAGEAL DILATION N/A 05/21/2021   Procedure: ESOPHAGEAL DILATION;  Surgeon: Golda Claudis PENNER, MD;  Location: [facility];  Service: Gastroenterology;  Laterality: N/A;   ESOPHAGEAL DILATION  01/21/2022   Procedure: ESOPHAGEAL DILATION;  Surgeon: Eartha Flavors, Toribio, MD;  [facility];  Service: Gastroenterology;;   ESOPHAGOGASTRODUODENOSCOPY N/A 05/21/2021   Procedure: ESOPHAGOGASTRODUODENOSCOPY (EGD);  Surgeon: Golda Claudis PENNER, MD;  Location: [facility];  Service: Gastroenterology;  Laterality: N/A;   ESOPHAGOGASTRODUODENOSCOPY (EGD) WITH PROPOFOL  N/A 01/21/2022   Procedure: ESOPHAGOGASTRODUODENOSCOPY (EGD) WITH PROPOFOL ;  Surgeon: Eartha Flavors Toribio, MD;  [facility];  Service: Gastroenterology;  Laterality: N/A;   EYE SURGERY     Bilateral cataracts   INJECTION KNEE Right 02/20/2016   Procedure: KNEE INJECTION;  Surgeon: Oneil JAYSON Herald, MD;  [facility] OR;  Service: Orthopedics;  Laterality: Right;   JOINT REPLACEMENT     KNEE ARTHROSCOPY Left    LAPAROSCOPIC CHOLECYSTECTOMY     MALONEY DILATION N/A 05/21/2021   Procedure: MALONEY DILATION;  Surgeon: Golda Claudis PENNER, MD;  Location: [facility];  Service: Gastroenterology;  Laterality: N/A;   MITRAL VALVE REPAIR  ~ 2003   MULTIPLE TOOTH EXTRACTIONS     PACEMAKER IMPLANT N/A  09/23/2022   Procedure: PACEMAKER IMPLANT;  Surgeon: Francyne Headland, MD;  Location: [facility] INVAS[facility];  Service: Cardiovascular;  Laterality: N/A;   REMOVAL OF STONES  05/21/2021   Procedure: REMOVAL OF STONES;  Surgeon: Golda Claudis PENNER, MD;  Location: [facility];  Service: Gastroenterology;;   SHOULDER ARTHROSCOPY WITH ROTATOR CUFF REPAIR AND OPEN BICEPS TENODESIS Right 01/26/2019   Procedure: right shoulder arthroscopy, rotator cuff repair and biceps tenodesis;  Surgeon: Herald Oneil JAYSON, MD;  [facility];  Service: Orthopedics;  Laterality: Right;   SHOULDER OPEN ROTATOR CUFF REPAIR Left    SPHINCTEROTOMY  05/21/2021   Procedure: SPHINCTEROTOMY;  Surgeon: Golda Claudis PENNER, MD;  Location: [facility];  Service: Gastroenterology;;   TOTAL KNEE ARTHROPLASTY Left 02/20/2016   Procedure: LEFT TOTAL KNEE ARTHROPLASTY WITH RIGHT KNEE INJECTION;  Surgeon: Oneil JAYSON Herald, MD;  Location: [facility] OR;  Service: Orthopedics;  Laterality: Left;   TOTAL KNEE ARTHROPLASTY Right 07/06/2019   Procedure: RIGHT TOTAL KNEE ARTHROPLASTY;  Surgeon: Herald Oneil JAYSON, MD;  Location: [facility] OR;  Service: Orthopedics;  Laterality: Right;   UPPER GI ENDOSCOPY     VASECTOMY     Family History  Problem Relation Age of Onset   Congestive Heart Failure Father    Heart disease Father    Stroke Brother    Stroke Sister    Diabetes Mellitus II Brother    Diabetes Mellitus II Sister    Lung cancer Brother    Stroke Sister    Stroke Brother    Colon cancer Neg Hx    Esophageal cancer Neg Hx    Rectal cancer Neg Hx    Stomach cancer Neg Hx    Social History   Socioeconomic History   Marital status: Married    Spouse name: GLORIA   Number of children: 2   Years of education: 12   Highest education level: Tax Adviser degree: occupational, scientist, product/process development, or vocational program  Occupational History   Occupation:  Neurosurgeon    Comment: RETIRED  Tobacco Use   Smoking status: Former    Current packs/day: 0.00    Average packs/day: 3.0  packs/day for 35.0 years (105.0 ttl pk-yrs)    Types: Cigarettes    Start date: 02/15/1951    Quit date: 02/16/1988    Years since quitting: 35.0    Passive exposure: Past   Smokeless tobacco: Never   Tobacco comments:     Year Quit: 1990   Vaping Use  Vaping status: Never Used  Substance and Sexual Activity   Alcohol use: No   Drug use: Never   Sexual activity: Not Currently  Other Topics Concern   Not on file  Social History Narrative   Not on file   Social Drivers of Health   Financial Resource Strain: Low Risk  (02/22/2023)   Overall Financial Resource Strain (CARDIA)    Difficulty of Paying Living Expenses: Not hard at all  Food Insecurity: No Food Insecurity (02/22/2023)   Hunger Vital Sign    Worried About Running Out of Food in the Last Year: Never true    Ran Out of Food in the Last Year: Never true  Transportation Needs: No Transportation Needs (02/22/2023)   PRAPARE - Administrator, Civil Service (Medical): No    Lack of Transportation (Non-Medical): No  Physical Activity: Sufficiently Active (02/22/2023)   Exercise Vital Sign    Days of Exercise per Week: 3 days    Minutes of Exercise per Session: 60 min  Stress: No Stress Concern Present (02/22/2023)   [employer] of Occupational Health - Occupational Stress Questionnaire    Feeling of Stress : Not at all  Social Connections: Socially Integrated (02/22/2023)   Social Connection and Isolation Panel [NHANES]    Frequency of Communication with Friends and Family: More than three times a week    Frequency of Social Gatherings with Friends and Family: Once a week    Attends Religious Services: More than 4 times per year    Active Member of [employer] or Organizations: Yes    Attends Engineer, Structural: More than 4 times per year    Marital Status: Married    Tobacco Counseling Counseling given: Not Answered Tobacco comments:  Year Quit: 1990    Clinical Intake:  Pre-visit preparation  completed: Yes  Pain : No/denies pain     Diabetes: Yes CBG done?: No Did pt. bring in CBG monitor from home?: No  How often do you need to have someone help you when you read instructions, pamphlets, or other written materials from your doctor or pharmacy?: 1 - Never  Interpreter Needed?: No  Information entered by :: Charmaine Bloodgood LPN   Activities of Daily Living    02/18/2023    9:06 AM  In your present state of health, do you have any difficulty performing the following activities:  Hearing? 0  Vision? 0  Difficulty concentrating or making decisions? 0  Walking or climbing stairs? 0  Dressing or bathing? 0  Doing errands, shopping? 0  Preparing Food and eating ? N  Using the Toilet? N  In the past six months, have you accidently leaked urine? N  Do you have problems with loss of bowel control? N  Managing your Medications? N  Managing your Finances? N  Housekeeping or managing your Housekeeping? N    Patient Care Team: Zollie Lowers, MD as PCP - General (Family Medicine) Lavona Agent, MD as PCP - Cardiology (Cardiology) Roddie Bring, DPM as Consulting Physician (Podiatry) de Cyndia Mal BRAVO, MD (Inactive) as Attending Physician (Cardiology) Vicci Mcardle, OD (Optometry)  Indicate any recent Medical Services you may have received from other than [facility] providers in the past year (date may be approximate).     Assessment:   This is a routine wellness examination for [name].  Hearing/Vision screen Hearing Screening - Comments:: Denies hearing difficulties   Vision Screening - Comments:: Wears rx glasses - up to date with routine eye exams  with [facility]     Goals Addressed             This Visit's Progress    COMPLETED: Exercise 150 min/wk Moderate Activity       02/06/2020 AWV Goal: Exercise for General Health  Patient will verbalize understanding of the benefits of increased physical activity: Exercising regularly is important. It will  improve your overall fitness, flexibility, and endurance. Regular exercise also will improve your overall health. It can help you control your weight, reduce stress, and improve your bone density. Over the next year, patient will increase physical activity as tolerated with a goal of at least 150 minutes of moderate physical activity per week.  You can tell that you are exercising at a moderate intensity if your heart starts beating faster and you start breathing faster but can still hold a conversation. Moderate-intensity exercise ideas include: Walking 1 mile (1.6 km) in about 15 minutes Biking Hiking Golfing Dancing Water  aerobics Patient will verbalize understanding of everyday activities that increase physical activity by providing examples like the following: Yard work, such as: Insurance Underwriter Gardening Washing windows or floors Patient will be able to explain general safety guidelines for exercising:  Before you start a new exercise program, talk with your health care provider. Do not exercise so much that you hurt yourself, feel dizzy, or get very short of breath. Wear comfortable clothes and wear shoes with good support. Drink plenty of water  while you exercise to prevent dehydration or heat stroke. Work out until your breathing and your heartbeat get faster.      Remain active and independent        Depression Screen    02/22/2023    7:56 PM 01/26/2023    8:36 AM 01/26/2023    8:23 AM 10/26/2022    8:29 AM 07/22/2022    8:05 AM 04/19/2022    8:26 AM 02/16/2022    2:54 PM  PHQ 2/9 Scores  PHQ - 2 Score 0 0 0 0 0 0 0    Fall Risk    02/18/2023    9:06 AM 01/26/2023    8:36 AM 01/26/2023    8:23 AM 10/26/2022    8:29 AM 07/22/2022    8:05 AM  Fall Risk   Falls in the past year? 1 0 0 0 0  Number falls in past yr: 0      Injury with Fall? 1      Risk for fall due to : History of fall(s)       Follow up Falls evaluation completed;Education provided;Falls prevention discussed        MEDICARE RISK AT HOME: Medicare Risk at Home Any stairs in or around the home?: (Patient-Rptd) No If so, are there any without handrails?: (Patient-Rptd) No Home free of loose throw rugs in walkways, pet beds, electrical cords, etc?: (Patient-Rptd) No Adequate lighting in your home to reduce risk of falls?: (Patient-Rptd) Yes Life alert?: (Patient-Rptd) No Use of a cane, walker or w/c?: (Patient-Rptd) No Grab bars in the bathroom?: (Patient-Rptd) No Shower chair or bench in shower?: (Patient-Rptd) Yes Elevated toilet seat or a handicapped toilet?: (Patient-Rptd) No  TIMED UP AND GO:  Was the test performed?  No    Cognitive Function:        02/22/2023    7:58 PM 02/16/2022    2:56 PM 02/12/2021    2:41 PM 02/06/2020  8:57 AM 02/05/2019    9:47 AM  6CIT Screen  What Year? 0 points 0 points 0 points 0 points 0 points  What month? 0 points 0 points 0 points 0 points 0 points  What time? 0 points 0 points 0 points 0 points 0 points  Count back from 20 0 points 0 points 0 points 0 points 0 points  Months in reverse 0 points 0 points 0 points 0 points 0 points  Repeat phrase 0 points 0 points 0 points 0 points 4 points  Total Score 0 points 0 points 0 points 0 points 4 points    Immunizations Immunization History  Administered Date(s) Administered   Fluad Quad(high Dose 65+) 11/14/2018   Influenza Inj Mdck Quad Pf 11/14/2018   Influenza Nasal 11/29/2016   Influenza Split 01/15/2004, 11/15/2013, 12/03/2015, 11/28/2019   Influenza, High Dose Seasonal PF 11/25/2014, 11/29/2016, 11/24/2017, 11/22/2019, 11/19/2020, 12/03/2022   Influenza, Seasonal, Injecte, Preservative Fre 11/15/2012   Influenza-Unspecified 01/23/2008, 11/15/2012, 11/29/2013, 11/25/2014, 11/29/2016, 11/24/2017, 11/14/2018, 11/22/2019   Moderna Covid-19 Fall Seasonal Vaccine 66yrs & older 12/03/2022   Moderna SARS-COV2  Booster Vaccination 12/10/2019   Moderna Sars-Covid-2 Vaccination 03/20/2019, 04/17/2019, 12/10/2019   Pneumococcal Conjugate-13 01/22/2015   Pneumococcal Polysaccharide-23 02/15/2006   Respiratory Syncytial Virus Vaccine,Recomb Aduvanted(Arexvy) 02/03/2022   Td 02/16/1995   Tdap 09/21/1994, 01/17/2014   Zoster Recombinant(Shingrix) 07/28/2020, 10/30/2020    TDAP status: Up to date  Flu Vaccine status: Up to date  Pneumococcal vaccine status: Up to date  Covid-19 vaccine status: Information provided on how to obtain vaccines.   Qualifies for Shingles Vaccine? Yes   Zostavax completed No   Shingrix Completed?: Yes  Screening Tests Health Maintenance  Topic Date Due   Diabetic kidney evaluation - Urine ACR  04/19/2023   OPHTHALMOLOGY EXAM  06/02/2023   HEMOGLOBIN A1C  07/27/2023   FOOT EXAM  10/26/2023   DTaP/Tdap/Td (4 - Td or Tdap) 01/18/2024   Diabetic kidney evaluation - eGFR measurement  01/26/2024   Medicare Annual Wellness (AWV)  02/22/2024   Pneumonia Vaccine 5+ Years old  Completed   INFLUENZA VACCINE  Completed   COVID-19 Vaccine  Completed   Zoster Vaccines- Shingrix  Completed   HPV VACCINES  Aged Out    Health Maintenance  There are no preventive care reminders to display for this patient.   Colorectal cancer screening: No longer required.   Lung Cancer Screening: (Low Dose CT Chest recommended if Age 53-80 years, 20 pack-year currently smoking OR have quit w/in 15years.) does not qualify.   Lung Cancer Screening Referral: n/a  Additional Screening:  Hepatitis C Screening: does not qualify  Vision Screening: Recommended annual ophthalmology exams for early detection of glaucoma and other disorders of the eye. Is the patient up to date with their annual eye exam?  Yes  Who is the provider or what is the name of the office in which the patient attends annual eye exams? [facility] If pt is not established with a provider, would they like to be  referred to a provider to establish care? No .   Dental Screening: Recommended annual dental exams for proper oral hygiene  Diabetic Foot Exam: Diabetic Foot Exam: Completed 10/26/22  Community Resource Referral / Chronic Care Management: CRR required this visit?  No   CCM required this visit?  No     Plan:     I have personally reviewed and noted the following in the patient's chart:   Medical and social history Use  of alcohol, tobacco or illicit drugs  Current medications and supplements including opioid prescriptions. Patient is not currently taking opioid prescriptions. Functional ability and status Nutritional status Physical activity Advanced directives List of other physicians Hospitalizations, surgeries, and ER visits in previous 12 months Vitals Screenings to include cognitive, depression, and falls Referrals and appointments  In addition, I have reviewed and discussed with patient certain preventive protocols, quality metrics, and best practice recommendations. A written personalized care plan for preventive services as well as general preventive health recommendations were provided to patient.     Lavelle Pfeiffer [place], [place]   09/16/7972   After Visit Summary: (MyChart) Due to this being a telephonic visit, the after visit summary with patients personalized plan was offered to patient via MyChart   Nurse Notes: No concerns at this time

## 2023-02-22 NOTE — Patient Instructions (Signed)
 Mr. Holberg , Thank you for taking time to come for your Medicare Wellness Visit. I appreciate your ongoing commitment to your health goals. Please review the following plan we discussed and let me know if I can assist you in the future.   Referrals/Orders/Follow-Ups/Clinician Recommendations: Aim for 30 minutes of exercise or brisk walking, 6-8 glasses of water , and 5 servings of fruits and vegetables each day.  This is a list of the screening recommended for you and due dates:  Health Maintenance  Topic Date Due   Yearly kidney health urinalysis for diabetes  04/19/2023   Eye exam for diabetics  06/02/2023   Hemoglobin A1C  07/27/2023   Complete foot exam   10/26/2023   DTaP/Tdap/Td vaccine (4 - Td or Tdap) 01/18/2024   Yearly kidney function blood test for diabetes  01/26/2024   Medicare Annual Wellness Visit  02/22/2024   Pneumonia Vaccine  Completed   Flu Shot  Completed   COVID-19 Vaccine  Completed   Zoster (Shingles) Vaccine  Completed   HPV Vaccine  Aged Out    Advanced directives: (ACP Link)Information on Advanced Care Planning can be found at [place]  Secretary of State Advance Health Care Directives Advance Health Care Directives ([email or web address]   Next Medicare Annual Wellness Visit scheduled for next year: Yes

## 2023-03-10 DIAGNOSIS — L03032 Cellulitis of left toe: Secondary | ICD-10-CM | POA: Diagnosis not present

## 2023-03-10 DIAGNOSIS — M79676 Pain in unspecified toe(s): Secondary | ICD-10-CM | POA: Diagnosis not present

## 2023-03-22 ENCOUNTER — Encounter (HOSPITAL_COMMUNITY): Payer: Self-pay

## 2023-03-22 ENCOUNTER — Ambulatory Visit: Payer: Self-pay | Admitting: Family Medicine

## 2023-03-22 ENCOUNTER — Emergency Department (HOSPITAL_COMMUNITY)
Admission: EM | Admit: 2023-03-22 | Discharge: 2023-03-22 | Disposition: A | Discharge status: Home / Self Care | Payer: Medicare Other | Attending: Emergency Medicine | Admitting: Emergency Medicine

## 2023-03-22 ENCOUNTER — Other Ambulatory Visit: Payer: Self-pay

## 2023-03-22 DIAGNOSIS — R319 Hematuria, unspecified: Secondary | ICD-10-CM | POA: Insufficient documentation

## 2023-03-22 DIAGNOSIS — Z7901 Long term (current) use of anticoagulants: Secondary | ICD-10-CM | POA: Diagnosis not present

## 2023-03-22 LAB — URINALYSIS, ROUTINE W REFLEX MICROSCOPIC
Bacteria, UA: NONE SEEN
Bilirubin Urine: NEGATIVE
Glucose, UA: NEGATIVE mg/dL
Ketones, ur: NEGATIVE mg/dL
Leukocytes,Ua: NEGATIVE
Nitrite: NEGATIVE
Protein, ur: 100 mg/dL — AB
RBC / HPF: >50 RBC/hpf (ref 0–5)
Specific Gravity, Urine: 1.017 (ref 1.005–1.030)
WBC, UA: >50 WBC/hpf (ref 0–5)
pH: 6 (ref 5.0–8.0)

## 2023-03-22 LAB — CBC WITH DIFFERENTIAL/PLATELET
Abs Immature Granulocytes: 0.02 10*3/uL (ref 0.00–0.07)
Basophils Absolute: 0 10*3/uL (ref 0.0–0.1)
Basophils Relative: 1 %
Eosinophils Absolute: 0.1 10*3/uL (ref 0.0–0.5)
Eosinophils Relative: 1 %
HCT: 42.6 % (ref 39.0–52.0)
Hemoglobin: 14.3 g/dL (ref 13.0–17.0)
Immature Granulocytes: 0 %
Lymphocytes Relative: 22 %
Lymphs Abs: 1.4 10*3/uL (ref 0.7–4.0)
MCH: 32.8 pg (ref 26.0–34.0)
MCHC: 33.6 g/dL (ref 30.0–36.0)
MCV: 97.7 fL (ref 80.0–100.0)
Monocytes Absolute: 0.7 10*3/uL (ref 0.1–1.0)
Monocytes Relative: 10 %
Neutro Abs: 4.3 10*3/uL (ref 1.7–7.7)
Neutrophils Relative %: 66 %
Platelets: 213 10*3/uL (ref 150–400)
RBC: 4.36 MIL/uL (ref 4.22–5.81)
RDW: 13.7 % (ref 11.5–15.5)
WBC: 6.5 10*3/uL (ref 4.0–10.5)
nRBC: 0 % (ref 0.0–0.2)

## 2023-03-22 LAB — COMPREHENSIVE METABOLIC PANEL
ALT: 15 U/L (ref 0–44)
AST: 19 U/L (ref 15–41)
Albumin: 4.6 g/dL (ref 3.5–5.0)
Alkaline Phosphatase: 59 U/L (ref 38–126)
Anion gap: 9 (ref 5–15)
BUN: 16 mg/dL (ref 8–23)
CO2: 30 mmol/L (ref 22–32)
Calcium: 9.3 mg/dL (ref 8.9–10.3)
Chloride: 101 mmol/L (ref 98–111)
Creatinine, Ser: 0.72 mg/dL (ref 0.61–1.24)
GFR, Estimated: >60 mL/min (ref >60)
Glucose, Bld: 101 mg/dL — ABNORMAL HIGH (ref 70–99)
Potassium: 3.8 mmol/L (ref 3.5–5.1)
Sodium: 140 mmol/L (ref 135–145)
Total Bilirubin: 1.1 mg/dL (ref 0.0–1.2)
Total Protein: 8 g/dL (ref 6.5–8.1)

## 2023-03-22 MED ORDER — CEPHALEXIN 500 MG PO CAPS: 500.0000 mg | ORAL_CAPSULE | Freq: Three times a day (TID) | ORAL | 0 refills | Status: AC

## 2023-03-22 MED ORDER — CEPHALEXIN 500 MG PO CAPS
500.0000 mg | ORAL_CAPSULE | Freq: Once | ORAL | Status: AC
  Administered 2023-03-22: 500 mg via ORAL
  Filled 2023-03-22: qty 1

## 2023-03-22 NOTE — ED Provider Notes (Signed)
 [place] [facility]GENCY DEPARTMENT AT [facility] Provider Note   CSN: [id number] Arrival date & time: 03/22/23  1650     History  Chief Complaint  Patient presents with   Hematuria    [name] is a 88 y.o. male.   Hematuria   88 year old male history of anticoagulation on Eliquis  because of atrial fibrillation.  Reports that he started having hematuria today, 2 different episodes, no pain with urination, no frequent urination, no back pain abdominal pain fevers chills nausea or vomiting.  He has had this happen once in the past but it was not quite as much as it was today.  He called his doctor's office and they told him to come to the ER.  He does follow with urology for his BPH    Home Medications Prior to Admission medications   Medication Sig Start Date End Date Taking? Authorizing Provider  cephALEXin  (KEFLEX ) 500 MG capsule Take 1 capsule (500 mg total) by mouth 3 (three) times daily for 7 days. 03/22/23 03/29/23 Yes Cleotilde Rogue, MD  Accu-Chek Softclix Lancets lancets Test BS in the morning, at noon and at bedtime Dx E11.40 11/01/22   Zollie Lowers, MD  apixaban  (ELIQUIS ) 5 MG TABS tablet Take 1 tablet (5 mg total) by mouth 2 (two) times daily. 09/24/22   Croitoru, Mihai, MD  Bacillus Coagulans-Inulin (ALIGN PREBIOTIC-PROBIOTIC PO) Take 1 capsule by mouth daily.    [provider]  Blood Glucose Monitoring Suppl DEVI 1 each by Does not apply route in the morning, at noon, and at bedtime. May substitute to any manufacturer covered by patient's insurance. 07/22/22   Zollie Lowers, MD  Calcium  Carb-Cholecalciferol (CALCIUM  600 + D PO) Take 1 tablet by mouth daily.    [provider]  clobetasol  cream (TEMOVATE ) 0.05 % Apply 1 Application topically 2 (two) times daily as needed (Psoriasis). 12/10/21   [provider]  Coenzyme Q10 300 MG CAPS Take 300 mg by mouth daily.    [provider]  colestipol  (COLESTID ) 1 g tablet Take 4  tablets (4 g total) by mouth daily. take it 4 hours apart from his other medications 06/21/22   Eartha Flavors, Toribio, MD  finasteride (PROSCAR) 5 MG tablet Take 5 mg by mouth daily. 06/03/22   [provider]  fluticasone (CUTIVATE) 0.05 % cream Apply 1 Application topically 2 (two) times daily as needed (Psoriasis). 10/22/21   [provider]  folic acid (FOLVITE) 1 MG tablet Take 1 mg by mouth daily. 12/10/21   [provider]  Glucose Blood (BLOOD GLUCOSE TEST STRIPS) STRP 1 each by In Vitro route in the morning, at noon, and at bedtime. May substitute to any manufacturer covered by patient's insurance. 07/22/22   Zollie Lowers, MD  metFORMIN  (GLUCOPHAGE -XR) 500 MG 24 hr tablet TAKE 1 TABLET BY MOUTH EVERY DAY WITH BREAKFAST 04/19/22   Zollie Lowers, MD  methotrexate (RHEUMATREX) 2.5 MG tablet Take 5 mg by mouth once a week. 11/24/21   [provider]  Multiple Vitamin (MULTIVITAMIN WITH MINERALS) TABS tablet Take 1 tablet by mouth daily.    [provider]  omeprazole  (PRILOSEC) 40 MG capsule Take 1 capsule (40 mg total) by mouth daily. 06/21/22   Eartha Flavors Toribio, MD  rosuvastatin  (CRESTOR ) 5 MG tablet Take 1 tablet (5 mg total) by mouth daily. For cholesterol 04/19/22   Zollie Lowers, MD  silodosin  (RAPAFLO ) 4 MG CAPS capsule Take 1 capsule (4 mg total) by mouth daily with  breakfast. 10/26/22   Zollie Lowers, MD  triamcinolone cream (KENALOG) 0.1 % Apply 1 Application topically daily as needed (Psoriasis). 12/30/21   [provider]      Allergies    Flomax [tamsulosin hcl]    Review of Systems   Review of Systems  Genitourinary:  Positive for hematuria.  All other systems reviewed and are negative.   Physical Exam Updated Vital Signs BP (!) 171/79 (BP Location: Right Arm)   Pulse (!) 56   Temp 98.4 F (36.9 C) (Oral)   Resp 14   Ht 1.803 m (5' 11)   Wt 87.1 kg   SpO2 95%   BMI 26.78 kg/m  Physical Exam Vitals and  nursing note reviewed.  Constitutional:      General: He is not in acute distress.    Appearance: He is well-developed.  HENT:     Head: Normocephalic and atraumatic.     Mouth/Throat:     Pharynx: No oropharyngeal exudate.  Eyes:     General: No scleral icterus.       Right eye: No discharge.        Left eye: No discharge.     Conjunctiva/sclera: Conjunctivae normal.     Pupils: Pupils are equal, round, and reactive to light.  Neck:     Thyroid : No thyromegaly.     Vascular: No JVD.  Cardiovascular:     Rate and Rhythm: Normal rate. Rhythm irregular.     Heart sounds: Normal heart sounds. No murmur heard.    No friction rub. No gallop.  Pulmonary:     Effort: Pulmonary effort is normal. No respiratory distress.     Breath sounds: Normal breath sounds. No wheezing or rales.  Abdominal:     General: Bowel sounds are normal. There is no distension.     Palpations: Abdomen is soft. There is no mass.     Tenderness: There is no abdominal tenderness.  Musculoskeletal:        General: No tenderness. Normal range of motion.     Cervical back: Normal range of motion and neck supple.  Lymphadenopathy:     Cervical: No cervical adenopathy.  Skin:    General: Skin is warm and dry.     Findings: No erythema or rash.  Neurological:     Mental Status: He is alert.     Coordination: Coordination normal.  Psychiatric:        Behavior: Behavior normal.     ED Results / Procedures / Treatments   Labs (all labs ordered are listed, but only abnormal results are displayed) Labs Reviewed  URINALYSIS, ROUTINE W REFLEX MICROSCOPIC - Abnormal; Notable for the following components:      Result Value   Color, Urine RED (*)    APPearance CLOUDY (*)    Hgb urine dipstick MODERATE (*)    Protein, ur 100 (*)    All other components within normal limits  COMPREHENSIVE METABOLIC PANEL - Abnormal; Notable for the following components:   Glucose, Bld 101 (*)    All other components within  normal limits  CBC WITH DIFFERENTIAL/PLATELET    EKG None  Radiology No results found.  Procedures Procedures    Medications Ordered in ED Medications  cephALEXin  (KEFLEX ) capsule 500 mg (has no administration in time range)    ED Course/ Medical Decision Making/ A&P  Medical Decision Making Amount and/or Complexity of Data Reviewed Labs: ordered.  Risk Prescription drug management.   Mild A-fib, exam unremarkable otherwise I discussed with the patient the risks of going off of the Eliquis  for a short time until the bleeding stops as well as drinking plenty of clear liquids and treating for potential UTI, the patient is agreeable, he is very stable appearing, he is not anemic, he can follow-up with urology and is agreeable to the plan.  He does not have any urinary retention        Final Clinical Impression(s) / ED Diagnoses Final diagnoses:  Hematuria, unspecified type    Rx / DC Orders ED Discharge Orders          Ordered    cephALEXin  (KEFLEX ) 500 MG capsule  3 times daily        03/22/23 2106              Cleotilde Rogue, MD 03/22/23 2107

## 2023-03-22 NOTE — Telephone Encounter (Signed)
   Chief Complaint: blood in urine  Symptoms: peeing pure blood  Frequency: twice   Disposition: [x] ED /[] Urgent Care (no appt availability in office) / [] Appointment(In office/virtual)/ []  [facility]/ [] Home Care/ [] Refused Recommended Disposition /[] [facility]/ []  Follow-up with PCP Additional Notes: Pt complaining of peeing pure blood since 1400. Pt stated he woke up with no issues, played golf, and got home a little bit ago to pure blood in urine. Pt stated it happened at 1400 and 1545. Pt states no pain while urinating, but now his lower back is sore. Per protocol, RN advised pt to go to ED. RN explained reasoning and pt verbalized understanding. Wife is planning to drive pt. RN gave care advice and pt verbalized understanding.          Copied from CRM [id number]. Topic: Clinical - Red Word Triage >> Mar 22, 2023  3:55 PM Montie POUR wrote: Red Word that prompted transfer to Nurse Triage: He is urinating blood. It happened this afternoon Reason for Disposition  Passing pure blood or large blood clots (i.e., size > a dime)  (Exception: Fleck or small strands.)  Answer Assessment - Initial Assessment Questions 1. COLOR of URINE: Describe the color of the urine.  (e.g., tea-colored, pink, red, bloody) Do you have blood clots in your urine? (e.g., none, pea, grape, small coin)     Bright red blood 2. ONSET: When did the bleeding start?      1400 today 3. EPISODES: How many times has there been blood in the urine? or How many times today?     2 4. PAIN with URINATION: Is there any pain with passing your urine? If Yes, ask: How bad is the pain?  (Scale 1-10; or mild, moderate, severe)    - MILD: Complains slightly about urination hurting.    - MODERATE: Interferes with normal activities.      - SEVERE: Excruciating, unwilling or unable to urinate because of the pain.      No pain  5. FEVER: Do you have a fever? If Yes, ask: What is your  temperature, how was it measured, and when did it start?     Denies  6. ASSOCIATED SYMPTOMS: Are you passing urine more frequently than usual?     normal 7. OTHER SYMPTOMS: Do you have any other symptoms? (e.g., back/flank pain, abdomen pain, vomiting)     Lower back pain  Protocols used: Urine - Blood In-A-AH

## 2023-03-22 NOTE — ED Triage Notes (Signed)
Pt arrived via POV from home c/o hematuria that began today. Pt denies flank pain. Pt does report he is on Eliquis and has seen a urologist in the past for same complaint.

## 2023-03-22 NOTE — Discharge Instructions (Signed)
Is make sure that you are your donating frequently, drink plenty of clear liquids, take cephalexin as prescribed and call your urologist for an appointment within the next week, return to the ER if you are not able to pee or if symptoms are worsening

## 2023-03-24 LAB — URINE CULTURE: Culture: NO GROWTH

## 2023-03-30 ENCOUNTER — Ambulatory Visit (INDEPENDENT_AMBULATORY_CARE_PROVIDER_SITE_OTHER): Payer: Medicare Other

## 2023-03-30 DIAGNOSIS — I482 Chronic atrial fibrillation, unspecified: Secondary | ICD-10-CM | POA: Diagnosis not present

## 2023-03-31 ENCOUNTER — Encounter: Payer: Self-pay | Admitting: Cardiovascular Disease

## 2023-03-31 LAB — CUP PACEART REMOTE DEVICE CHECK
Battery Remaining Longevity: 120 mo
Battery Remaining Percentage: 100 %
Brady Statistic RV Percent Paced: 52 %
Date Time Interrogation Session: 20250207012100
Implantable Lead Connection Status: 753985
Implantable Lead Implant Date: 20240808
Implantable Lead Location: 753860
Implantable Lead Model: 7842
Implantable Lead Serial Number: 1314359
Implantable Pulse Generator Implant Date: 20240808
Lead Channel Impedance Value: 677 Ohm
Lead Channel Setting Pacing Amplitude: 2 V
Lead Channel Setting Pacing Pulse Width: 0.4 ms
Lead Channel Setting Sensing Sensitivity: 3.5 mV
Pulse Gen Serial Number: 907855
Zone Setting Status: 755011

## 2023-04-11 ENCOUNTER — Inpatient Hospital Stay: Admission: RE | Admit: 2023-04-11 | Payer: Medicare Other | Source: Ambulatory Visit

## 2023-04-11 ENCOUNTER — Other Ambulatory Visit (HOSPITAL_COMMUNITY)
Admission: RE | Admit: 2023-04-11 | Discharge: 2023-04-11 | Disposition: A | Discharge status: Home / Self Care | Payer: Medicare Other | Source: Ambulatory Visit | Attending: Dermatology | Admitting: Dermatology

## 2023-04-11 DIAGNOSIS — L400 Psoriasis vulgaris: Secondary | ICD-10-CM | POA: Insufficient documentation

## 2023-04-11 LAB — CBC WITH DIFFERENTIAL/PLATELET
Abs Immature Granulocytes: 0.03 10*3/uL (ref 0.00–0.07)
Basophils Absolute: 0 10*3/uL (ref 0.0–0.1)
Basophils Relative: 1 %
Eosinophils Absolute: 0.1 10*3/uL (ref 0.0–0.5)
Eosinophils Relative: 1 %
HCT: 43 % (ref 39.0–52.0)
Hemoglobin: 14.6 g/dL (ref 13.0–17.0)
Immature Granulocytes: 1 %
Lymphocytes Relative: 20 %
Lymphs Abs: 1.3 10*3/uL (ref 0.7–4.0)
MCH: 33.1 pg (ref 26.0–34.0)
MCHC: 34 g/dL (ref 30.0–36.0)
MCV: 97.5 fL (ref 80.0–100.0)
Monocytes Absolute: 0.6 10*3/uL (ref 0.1–1.0)
Monocytes Relative: 9 %
Neutro Abs: 4.5 10*3/uL (ref 1.7–7.7)
Neutrophils Relative %: 68 %
Platelets: 221 10*3/uL (ref 150–400)
RBC: 4.41 MIL/uL (ref 4.22–5.81)
RDW: 13.5 % (ref 11.5–15.5)
WBC: 6.6 10*3/uL (ref 4.0–10.5)
nRBC: 0 % (ref 0.0–0.2)

## 2023-04-11 LAB — COMPREHENSIVE METABOLIC PANEL
ALT: 16 U/L (ref 0–44)
AST: 18 U/L (ref 15–41)
Albumin: 4.4 g/dL (ref 3.5–5.0)
Alkaline Phosphatase: 61 U/L (ref 38–126)
Anion gap: 10 (ref 5–15)
BUN: 17 mg/dL (ref 8–23)
CO2: 29 mmol/L (ref 22–32)
Calcium: 9.1 mg/dL (ref 8.9–10.3)
Chloride: 101 mmol/L (ref 98–111)
Creatinine, Ser: 0.71 mg/dL (ref 0.61–1.24)
GFR, Estimated: >60 mL/min (ref >60)
Glucose, Bld: 114 mg/dL — ABNORMAL HIGH (ref 70–99)
Potassium: 4.1 mmol/L (ref 3.5–5.1)
Sodium: 140 mmol/L (ref 135–145)
Total Bilirubin: 1.1 mg/dL (ref 0.0–1.2)
Total Protein: 7.7 g/dL (ref 6.5–8.1)

## 2023-04-11 LAB — LIPID PANEL
Cholesterol: 107 mg/dL (ref 0–200)
HDL: 56 mg/dL (ref >40)
LDL Cholesterol: 30 mg/dL (ref 0–99)
Total CHOL/HDL Ratio: 1.9 {ratio}
Triglycerides: 104 mg/dL (ref <150)
VLDL: 21 mg/dL (ref 0–40)

## 2023-04-12 ENCOUNTER — Ambulatory Visit: Payer: Medicare Other | Admitting: Cardiovascular Disease

## 2023-04-25 ENCOUNTER — Ambulatory Visit: Payer: Medicare Other | Attending: Cardiovascular Disease | Admitting: Cardiovascular Disease

## 2023-04-25 ENCOUNTER — Encounter: Payer: Self-pay | Admitting: Cardiovascular Disease

## 2023-04-25 DIAGNOSIS — Z950 Presence of cardiac pacemaker: Secondary | ICD-10-CM

## 2023-04-25 DIAGNOSIS — I4891 Unspecified atrial fibrillation: Secondary | ICD-10-CM

## 2023-04-25 DIAGNOSIS — E118 Type 2 diabetes mellitus with unspecified complications: Secondary | ICD-10-CM | POA: Diagnosis not present

## 2023-04-25 DIAGNOSIS — Z7901 Long term (current) use of anticoagulants: Secondary | ICD-10-CM | POA: Diagnosis not present

## 2023-04-25 DIAGNOSIS — Z9889 Other specified postprocedural states: Secondary | ICD-10-CM | POA: Diagnosis not present

## 2023-04-25 DIAGNOSIS — I10 Essential (primary) hypertension: Secondary | ICD-10-CM | POA: Diagnosis not present

## 2023-04-25 DIAGNOSIS — G4733 Obstructive sleep apnea (adult) (pediatric): Secondary | ICD-10-CM

## 2023-04-25 NOTE — Progress Notes (Signed)
 Cardiology Office Note    Date:  05/01/2023   ID:  [name], DOB [birth year], MRN [id number]  PCP:  Mechele Claude, MD  Cardiologist:  Nicki Guadalajara, MD (sleep); Dr. Antoine Poche, Dr. Royann Shivers (pacemaker)  5 month F/U sleep clinic evaluation  History of Present Illness:  [name] is a 88 y.o. male who sees Dr. Antoine Poche for cardiology care and Dr. Royann Shivers for pacemaker management.  He presents for a follow-up sleep evaluation.  [name]  has a history of mitral valve repair as well as atrial fibrillation.  He had undergone DC cardioversion, but subsequently, he developed recurrent AF.  Due to concerns for sleep apnea he was referred for a sleep study on July 21, 2016 and was found to have severe obstructive sleep apnea with an AHI of 55.4 per hour.  AHI during REM sleep was 57.5 per hour.  He had significant oxygen desaturation to 80% with non-REM and 72% during REM sleep.  There was loud snoring.  CPAP was implemented and was titrated to an optimal pressure of 14 cm.  His CPAP set up date was 08/23/2016 and he has a ResMed air since 10 AutoSet unit.  He has been using a ResMed airFit F 20 medium size mask.  A download was obtained from August 26 to 11/08/2016.  His compliance is excellent at 100%.  He is averaging 8 hours and 39 minutes per night of sleep.  At a set pressure of 14 cm, however, AHI was still mildly elevated at 6.5, with an apnea index of 5.6.  I saw him for initial sleep clinic evaluation in September 2018 at which time he had noted a significant improvement since CPAP initiation.  Previously he had experienced loud snoring and nocturia at least 3-4 times per night.  He now is unaware of any snoring.  Most nights he can sleep without urination but at times he wakes up 1 time per night.  He feels more refreshed.  His sleep is restorative.  He denies any daytime sleepiness,  bruxism, restless legs, hypnogognic hallucinations, or cataplexy.  An Epworth Sleepiness Scale  score was calculated in the office which endorsed at 8 shown below.  Epworth Sleepiness Scale: Situation   Chance of Dozing/Sleeping (0 = never , 1 = slight chance , 2 = moderate chance , 3 = high chance )   sitting and reading 1   watching TV 3   sitting inactive in a public place 0   being a passenger in a motor vehicle for an hour or more 0   lying down in the afternoon 2   sitting and talking to someone 0   sitting quietly after lunch (no alcohol) 2   while stopped for a few minutes in traffic as the driver 0   Total Score  8   I saw him for 1 year follow-up in December 2019 at which time he was doing well.  [name] has continued to derive benefit from CPAP therapy.  He typically goes to bed around 10 PM but often times turns the TV off at 11 PM.  He typically wakes up between 7 or 8 AM.  He believes he is sleeping well.  If situations permit he does take a daily nap.  A new Epworth scale was calculated in the office today and this endorsed at 6.  At times he has had some difficulty with the pressure on the bridge of his nose from his  full facemask.  During that evaluation, a new download was obtained from November 18 through January 31, 2018.  Compliance remained excellent and his auto pressure ranged from 12 to 20 cm of water and AHI was 6.2.  He was requiring almost maximal pressure with his 95 percentile pressure at 19.6.  With calluses on the bridge of his nose secondary to his full facemask I have suggested transition to the new DreamWear technology.  When I saw him in March 2021 he was doing well from a sleep perspective.  He has the DreamWear mask but he has had irritation resulting from the facial cushion.  A new download from February 15 through May 01, 2019 was obtained.  Compliance remains excellent but average sleep duration was 6 hours and 14 minutes.  His CPAP Auto unit was set at a range of 16-20 and again his 95th percentile pressure was near maximum at 19.8.  He believes  he is sleeping well.  He is unaware of breakthrough snoring.  I calculated a new Epworth Sleepiness Scale score today in the office and this endorsed at 5 arguing against residual daytime sleepiness.  He has continued to be on amlodipine 5 mg and lisinopril 20 mg for hypertension.  He has atrial fibrillation with a controlled rate and continues to be on anticoagulation with Eliquis without bleeding.  He is diabetic on metformin.  He has been on low-dose rosuvastatin for hyperlipidemia with target LDL less than 70.   I saw him on May 23, 2020 and over the prior year he continues to do well from a sleep perspective. A new download from April 23, 2020 through May 22, 2020 demonstrated excellent compliance with average CPAP use at 8 hours and 23 minutes.  He has required high pressures with his minimum pressure at 16 and maximal 20 cm, and his 95th percentile pressure is 19.7 with maximum average pressure at 19.9.  An Epworth Sleepiness Scale score was calculated in the office today and this endorsed at 4 arguing against residual daytime sleepiness.  His atrial fibrillation rate has been controlled.  He denied any chest pain.  He was on amlodipine 5 mg, lisinopril 20 mg for hypertension, Eliquis 5 mg twice a day for anticoagulation, and rosuvastatin 5 mg for hyperlipidemia with LDL cholesterol at 44.  He is diabetic on Metformin.  During that evaluation his blood pressure was stable and his atrial fibrillation rate was well controlled.  When I saw him on November 15, 2021 he had a 50 pound weight loss.  He continued to have issues with significant psoriasis.  He was using CPAP but has not been as compliant as he had been in the past.  I obtained a download from September 17 through November 30, 2021.  Usage days was 83%.  Usage duration average 6 hours and 34 minutes.  He has required high pressure in his range his been 7 to 20 cm with AHI of 1.3.  His 95th percentile pressure is 19.7 with maximum average pressure at  19.8.  He has not used CPAP over the past 4 days and felt that perhaps the pressure may have been too high.  A download did not reveal any leak.  His DME company will be changing to [employer].  He continues to be on lisinopril 10 mg, amlodipine 5 mg for hypertension.  He is on methotrexate for his psoriatic arthritis.  He is diabetic on metformin.  He is on rosuvastatin for his lipids.  During that evaluation, I  discussed potential need for future BiPAP therapy if he continued to require extremely high pressures.  He has continued to be followed by Dr. Antoine Poche and last saw him on October 22, 2022.  He is status post implantation of a single-chamber permanent pacemaker for syncope placed on September 23, 2022 by Dr. Royann Shivers.  I last saw him on December 09, 2022.  He was continuing to use CPAP therapy and was using his ResMed AirSense 10 AutoSet unit at a pressure range of 15 to 20 cm.  His set up date was August 23, 2016 and he qualifies for new machine.  His DME company had been [employer] and this may have changed.  His most recent download from September 22 through October 21 shows 100% compliance with average use of 7 hours and 54 minutes.  His pressure set at a range of 15 to 20 cm with EPR of 3 and AHI is 1.7.  95th percentile pressure is 17.4 with maximum average pressure of 18.6.  He has been using a ResMed AirFit F30i mask.    Since I last saw him, he tells me he was taken off lisinopril approximately 4 months ago by Dr. Darlyn Read.  His DME company has changed from [place] to [place].  He apparently received a new ResMed AirSense 11 CPAP machine but his new machine is not linked to our office.  As result, I only have data until February 3 which is on his old machine.  At that time from January 9 through February 3 compliance was 100% with AHI 1.4 at a pressure range of 15 to 20 cm.  His 95th percentile pressure was 17.2 with maximum average 18.3.  His new machine will need to be linked to our office.  He typically  goes to bed at 10 PM and wakes up between 6 and 7 AM giving him typically 8 to 9 hours of's sleep.  With CPAP there is no residual snoring.  He denies residual daytime sleepiness.  He presents for evaluation.   Past Medical History:  Diagnosis Date   Arthritis    knees, shoulder,  hips (10/24/2017)   Atrial fibrillation (HCC)    on eliquis   BPH (benign prostatic hyperplasia)    Cataracts, bilateral    removed by surgery   Coronary artery disease excluded    Diabetes mellitus without complication (HCC)    type 2   Diverticulosis    Endocarditis, valve unspecified, unspecified cause    GERD (gastroesophageal reflux disease)    H/O mitral valve repair    Postoperative ring with postoperative atrial fibrillation   Hearing loss    both ears - does not use hearing aids   Heart murmur    hx   Hiatal hernia 06/13/2002   High cholesterol    History of kidney stones    passed spontaneously x2    OSA on CPAP    uses cpap nightly   Sleep apnea    Stroke [facility])    Unspecified essential hypertension    Weakness of both arms 07/19/2012   Wears dentures    upper and lower partial    Past Surgical History:  Procedure Laterality Date   BIOPSY  01/21/2022   Procedure: BIOPSY;  Surgeon: Dolores Frame, MD;  [facility];  Service: Gastroenterology;;   CARDIAC CATHETERIZATION  02/14/2007   x 2   CARDIOVERSION N/A 04/14/2016   Procedure: CARDIOVERSION;  Surgeon: Rollene Rotunda, MD;  Location: [facility] ENDOSCOPY;  Service: Cardiovascular;  Laterality: N/A;   CATARACT EXTRACTION W/PHACO Right 05/12/2015   Procedure: CATARACT EXTRACTION PHACO AND INTRAOCULAR LENS PLACEMENT RIGHT EYE CDE=7.75;  Surgeon: Gemma Payor, MD;  Location: [facility] ORS;  Service: Ophthalmology;  Laterality: Right;   CATARACT EXTRACTION W/PHACO Left 06/12/2015   Procedure: CATARACT EXTRACTION PHACO AND INTRAOCULAR LENS PLACEMENT (IOC);  Surgeon: Gemma Payor, MD;  Location: [facility];  Service: Ophthalmology;  Laterality:  Left;  CDE:  13.17   COLONOSCOPY     COLONOSCOPY WITH PROPOFOL N/A 01/21/2022   Procedure: COLONOSCOPY WITH PROPOFOL;  Surgeon: Dolores Frame, MD;  Location: [facility];  Service: Gastroenterology;  Laterality: N/A;  115 ASA 3   ERCP N/A 05/21/2021   Procedure: ENDOSCOPIC RETROGRADE CHOLANGIOPANCREATOGRAPHY (ERCP);  Surgeon: Malissa Hippo, MD;  Location: [facility];  Service: Gastroenterology;  Laterality: N/A;   ERCP N/A 05/21/2021   Procedure: ENDOSCOPIC RETROGRADE CHOLANGIOPANCREATOGRAPHY (ERCP);  Surgeon: Malissa Hippo, MD;  Location: [facility];  Service: Gastroenterology;  Laterality: N/A;   ESOPHAGEAL DILATION N/A 05/21/2021   Procedure: ESOPHAGEAL DILATION;  Surgeon: Malissa Hippo, MD;  Location: [facility];  Service: Gastroenterology;  Laterality: N/A;   ESOPHAGEAL DILATION  01/21/2022   Procedure: ESOPHAGEAL DILATION;  Surgeon: Dolores Frame, MD;  [facility];  Service: Gastroenterology;;   ESOPHAGOGASTRODUODENOSCOPY N/A 05/21/2021   Procedure: ESOPHAGOGASTRODUODENOSCOPY (EGD);  Surgeon: Malissa Hippo, MD;  Location: [facility];  Service: Gastroenterology;  Laterality: N/A;   ESOPHAGOGASTRODUODENOSCOPY (EGD) WITH PROPOFOL N/A 01/21/2022   Procedure: ESOPHAGOGASTRODUODENOSCOPY (EGD) WITH PROPOFOL;  Surgeon: Dolores Frame, MD;  [facility];  Service: Gastroenterology;  Laterality: N/A;   EYE SURGERY     Bilateral cataracts   INJECTION KNEE Right 02/20/2016   Procedure: KNEE INJECTION;  Surgeon: Eldred Manges, MD;  Location[facility] OR;  Service: Orthopedics;  Laterality: Right;   JOINT REPLACEMENT     KNEE ARTHROSCOPY Left    LAPAROSCOPIC CHOLECYSTECTOMY     MALONEY DILATION N/A 05/21/2021   Procedure: MALONEY DILATION;  Surgeon: Malissa Hippo, MD;  Location: [facility];  Service: Gastroenterology;  Laterality: N/A;   MITRAL VALVE REPAIR  ~ 2003   MULTIPLE TOOTH EXTRACTIONS     PACEMAKER IMPLANT N/A 09/23/2022   Procedure: PACEMAKER IMPLANT;   Surgeon: Thurmon Fair, MD;  Location: [facility] INV[facility];  Service: Cardiovascular;  Laterality: N/A;   REMOVAL OF STONES  05/21/2021   Procedure: REMOVAL OF STONES;  Surgeon: Malissa Hippo, MD;  Location: [facility];  Service: Gastroenterology;;   SHOULDER ARTHROSCOPY WITH ROTATOR CUFF REPAIR AND OPEN BICEPS TENODESIS Right 01/26/2019   Procedure: right shoulder arthroscopy, rotator cuff repair and biceps tenodesis;  Surgeon: Eldred Manges, MD;  Location: [facility] OR;  Service: Orthopedics;  Laterality: Right;   SHOULDER OPEN ROTATOR CUFF REPAIR Left    SPHINCTEROTOMY  05/21/2021   Procedure: SPHINCTEROTOMY;  Surgeon: Malissa Hippo, MD;  Location: [facility];  Service: Gastroenterology;;   TOTAL KNEE ARTHROPLASTY Left 02/20/2016   Procedure: LEFT TOTAL KNEE ARTHROPLASTY WITH RIGHT KNEE INJECTION;  Surgeon: Eldred Manges, MD;  Location: [facility] OR;  Service: Orthopedics;  Laterality: Left;   TOTAL KNEE ARTHROPLASTY Right 07/06/2019   Procedure: RIGHT TOTAL KNEE ARTHROPLASTY;  Surgeon: Eldred Manges, MD;  Location: [facility] OR;  Service: Orthopedics;  Laterality: Right;   UPPER GI ENDOSCOPY     VASECTOMY      Current Medications: Outpatient Medications Prior to Visit  Medication Sig Dispense Refill   Accu-Chek Softclix  Lancets lancets Test BS in the morning, at noon and at bedtime Dx E11.40 300 each 3   apixaban (ELIQUIS) 5 MG TABS tablet Take 1 tablet (5 mg total) by mouth 2 (two) times daily. 180 tablet 3   Bacillus Coagulans-Inulin (ALIGN PREBIOTIC-PROBIOTIC PO) Take 1 capsule by mouth daily.     Blood Glucose Monitoring Suppl DEVI 1 each by Does not apply route in the morning, at noon, and at bedtime. May substitute to any manufacturer covered by patient's insurance. 1 each 0   Calcium Carb-Cholecalciferol (CALCIUM 600 + D PO) Take 1 tablet by mouth daily.     Coenzyme Q10 300 MG CAPS Take 300 mg by mouth daily.     colestipol (COLESTID) 1 g tablet Take 4 tablets (4 g total) by mouth daily. take it 4 hours apart  from his other medications 360 tablet 3   finasteride (PROSCAR) 5 MG tablet Take 5 mg by mouth daily.     fluticasone (CUTIVATE) 0.05 % cream Apply 1 Application topically 2 (two) times daily as needed (Psoriasis).     folic acid (FOLVITE) 1 MG tablet Take 1 mg by mouth daily.     Glucose Blood (BLOOD GLUCOSE TEST STRIPS) STRP 1 each by In Vitro route in the morning, at noon, and at bedtime. May substitute to any manufacturer covered by patient's insurance. 100 strip PRN   methotrexate (RHEUMATREX) 2.5 MG tablet Take 5 mg by mouth once a week.     Multiple Vitamin (MULTIVITAMIN WITH MINERALS) TABS tablet Take 1 tablet by mouth daily.     mupirocin ointment (BACTROBAN) 2 % Apply topically 2 (two) times daily.     omeprazole (PRILOSEC) 40 MG capsule Take 1 capsule (40 mg total) by mouth daily. 90 capsule 3   silodosin (RAPAFLO) 4 MG CAPS capsule Take 1 capsule (4 mg total) by mouth daily with breakfast. 90 capsule 3   triamcinolone cream (KENALOG) 0.1 % Apply 1 Application topically daily as needed (Psoriasis).     clobetasol cream (TEMOVATE) 0.05 % Apply 1 Application topically 2 (two) times daily as needed (Psoriasis).     metFORMIN (GLUCOPHAGE-XR) 500 MG 24 hr tablet TAKE 1 TABLET BY MOUTH EVERY DAY WITH BREAKFAST 90 tablet 3   rosuvastatin (CRESTOR) 5 MG tablet Take 1 tablet (5 mg total) by mouth daily. For cholesterol 90 tablet 3   No facility-administered medications prior to visit.     Allergies:   Flomax [tamsulosin hcl]   Social History   Socioeconomic History   Marital status: Married    Spouse name: GLORIA   Number of children: 2   Years of education: 12   Highest education level: Associate degree: occupational, Scientist, product/process development, or vocational program  Occupational History   Occupation:  Neurosurgeon    Comment: RETIRED  Tobacco Use   Smoking status: Former    Current packs/day: 0.00    Average packs/day: 3.0 packs/day for 35.0 years (105.0 ttl pk-yrs)    Types: Cigarettes     Start date: 02/15/1953    Quit date: 02/16/1988    Years since quitting: 35.2    Passive exposure: Past   Smokeless tobacco: Never   Tobacco comments:     Year Quit: 1990   Vaping Use   Vaping status: Never Used  Substance and Sexual Activity   Alcohol use: No   Drug use: Never   Sexual activity: Not Currently  Other Topics Concern   Not on file  Social History Narrative  Not on file   Social Drivers of Health   Financial Resource Strain: Low Risk  (04/23/2023)   Overall Financial Resource Strain (CARDIA)    Difficulty of Paying Living Expenses: Not hard at all  Food Insecurity: No Food Insecurity (04/23/2023)   Hunger Vital Sign    Worried About Running Out of Food in the Last Year: Never true    Ran Out of Food in the Last Year: Never true  Transportation Needs: No Transportation Needs (04/23/2023)   PRAPARE - Administrator, Civil Service (Medical): No    Lack of Transportation (Non-Medical): No  Physical Activity: Sufficiently Active (04/23/2023)   Exercise Vital Sign    Days of Exercise per Week: 3 days    Minutes of Exercise per Session: 50 min  Stress: No Stress Concern Present (04/23/2023)   [employer] of Occupational Health - Occupational Stress Questionnaire    Feeling of Stress : Not at all  Social Connections: Socially Integrated (04/23/2023)   Social Connection and Isolation Panel [NHANES]    Frequency of Communication with Friends and Family: More than three times a week    Frequency of Social Gatherings with Friends and Family: Once a week    Attends Religious Services: More than 4 times per year    Active Member of [employer] or Organizations: Yes    Attends Engineer, structural: More than 4 times per year    Marital Status: Married     Family History:  The patient's family history includes Congestive Heart Failure in his father; Diabetes Mellitus II in his brother and sister; Heart disease in his father; Lung cancer in his brother; Stroke in  his brother, brother, sister, and sister.   ROS General: Negative; No fevers, chills, or night sweats; purposeful weight loss HEENT: Negative; No changes in vision or hearing, sinus congestion, difficulty swallowing Pulmonary: Negative; No cough, wheezing, shortness of breath, hemoptysis Cardiovascular: Status post mitral valve repair.  Atrial fibrillation GI: Negative; No nausea, vomiting, diarrhea, or abdominal pain GU: Negative; No dysuria, hematuria, or difficulty voiding Musculoskeletal: Negative; no myalgias, joint pain, or weakness Hematologic/Oncology: Negative; no easy bruising, bleeding Endocrine: Negative; no heat/cold intolerance; no diabetes Neuro: Negative; no changes in balance, headaches Skin: Negative; No rashes or skin lesions Psychiatric: Negative; No behavioral problems, depression Sleep: See history of present illness   A new Epworth Sleepiness Scale score was calculated in the office today (04/25/2023) and this endorsed at 3 arguing against any residual daytime sleepiness. Other comprehensive 14 point system review is negative.   PHYSICAL EXAM:   VS:  BP (!) 146/70   Pulse (!) 54   Ht 5\' 11"  (1.803 m)   Wt 198 lb (89.8 kg)   SpO2 93%   BMI 27.62 kg/m     Repeat blood pressure by me was 142/70.  Wt Readings from Last 3 Encounters:  04/27/23 195 lb (88.5 kg)  04/25/23 198 lb (89.8 kg)  03/22/23 192 lb 0.3 oz (87.1 kg)    General: Alert, oriented, no distress.  Skin: normal turgor, no rashes, warm and dry HEENT: Normocephalic, atraumatic. Pupils equal round and reactive to light; sclera anicteric; extraocular muscles intact;  Nose without nasal septal hypertrophy Mouth/Parynx benign; Mallinpatti scale 3 Neck: No JVD, no carotid bruits; normal carotid upstroke Lungs: clear to ausculatation and percussion; no wheezing or rales Chest wall: without tenderness to palpitation Heart: PMI not displaced, RRR, s1 s2 normal, 1/6 systolic murmur, no diastolic  murmur, no rubs, gallops,  thrills, or heaves Abdomen: soft, nontender; no hepatosplenomehaly, BS+; abdominal aorta nontender and not dilated by palpation. Back: no CVA tenderness Pulses 2+ Musculoskeletal: full range of motion, normal strength, no joint deformities Extremities: no clubbing cyanosis or edema, Homan's sign negative  Neurologic: grossly nonfocal; Cranial nerves grossly wnl Psychologic: Normal mood and affect    Studies/Labs Reviewed:    EKG Interpretation Date/Time:  Monday April 25 2023 11:12:32 EDT Ventricular Rate:  54 PR Interval:    QRS Duration:  80 QT Interval:  434 QTC Calculation: 411 R Axis:   -33  Text Interpretation: Atrial fibrillation with slow ventricular response with occasional ventricular-paced complexes Left axis deviation Nonspecific T wave abnormality When compared with ECG of 09-Dec-2022 08:34, Electronic ventricular pacemaker has replaced Wide QRS rhythm Confirmed by Nicki Guadalajara ([id number]) on 05/01/2023 11:23:13 AM    December 09, 2022 ECG (independently read by me): Atrial fibrillation with several ventricular paced beats, rate in the 50s  I personally reviewed the ECG from November 26, 2021 which shows atrial fibrillation at 53 bpm.  Low voltage.  No ST segment changes.  May 23, 2020 ECG (independently read by me): Atrial fibrillation at 62, right axis deviation  I personally reviewed the most recent ECG done by Dr. Antoine Poche from December 01, 2018 which showed atrial fibrillation at 66 bpm; QTc interval 413 ms  Septemper 2018 ECG (independently read by me):  Atrial fibrillation at 63 bpm.  QTc interval 407 msec  Recent Labs:    Latest Ref Rng & Units 04/27/2023    8:46 AM 04/11/2023    1:47 PM 03/22/2023    7:41 PM  BMP  Glucose 70 - 99 mg/dL 846  962  952   BUN 8 - 27 mg/dL 17  17  16    Creatinine 0.76 - 1.27 mg/dL 8.41  3.24  4.01   BUN/Creat Ratio 10 - 24 19     Sodium 134 - 144 mmol/L 146  140  140   Potassium 3.5 - 5.2 mmol/L 4.7  4.1   3.8   Chloride 96 - 106 mmol/L 104  101  101   CO2 20 - 29 mmol/L 27  29  30    Calcium 8.6 - 10.2 mg/dL 9.2  9.1  9.3         Latest Ref Rng & Units 04/27/2023    8:46 AM 04/11/2023    1:47 PM 03/22/2023    7:41 PM  Hepatic Function  Total Protein 6.0 - 8.5 g/dL 7.2  7.7  8.0   Albumin 3.7 - 4.7 g/dL 4.5  4.4  4.6   AST 0 - 40 IU/L 17  18  19    ALT 0 - 44 IU/L 16  16  15    Alk Phosphatase 44 - 121 IU/L 78  61  59   Total Bilirubin 0.0 - 1.2 mg/dL 1.0  1.1  1.1        Latest Ref Rng & Units 04/27/2023    8:46 AM 04/11/2023    1:47 PM 03/22/2023    7:41 PM  CBC  WBC 3.4 - 10.8 x10E3/uL 5.2  6.6  6.5   Hemoglobin 13.0 - 17.7 g/dL 02.7  25.3  66.4   Hematocrit 37.5 - 51.0 % 42.2  43.0  42.6   Platelets 150 - 450 x10E3/uL 240  221  213    Lab Results  Component Value Date   MCV 97 04/27/2023   MCV 97.5 04/11/2023   MCV 97.7 03/22/2023  Lab Results  Component Value Date   TSH 2.04 08/17/2021   Lab Results  Component Value Date   HGBA1C 5.6 04/27/2023     BNP    Component Value Date/Time   BNP 75.4 03/21/2019 0921    ProBNP No results found for: "PROBNP"   Lipid Panel     Component Value Date/Time   CHOL 104 04/27/2023 0846   TRIG 64 04/27/2023 0846   HDL 58 04/27/2023 0846   CHOLHDL 1.8 04/27/2023 0846   CHOLHDL 1.9 04/11/2023 1348   VLDL 21 04/11/2023 1348   LDLCALC 32 04/27/2023 0846     RADIOLOGY: No results found.   Additional studies/ records that were reviewed today include:  I reviewed the records from Dr. Antoine Poche.  I reviewed his prior sleep study and obtain a new download to assess efficacy and compliance and obtain a new Epworth Sleepiness Scale score today.  ASSESSMENT:    1. OSA (obstructive sleep apnea) on CPAP   2. Essential hypertension   3. Atrial fibrillation with slow ventricular response (HCC)   4. Pacemaker: 09/23/2022   5. H/O mitral valve repair   6. Anticoagulated   7. Type 2 diabetes mellitus with complication, without  long-term current use of insulin [facility])     PLAN:  Mr. Ulin is a very pleasant 88 year-old gentleman who has cardiovascular comorbidities including mitral valve repair, permanent atrial fibrillation, hypertension, and mild carotid plaque.  He had undergone cardioversion for atrial fibrillation, but unfortunately develop ed recurrent atrial fibrillation and is now in permanent atrial fibrillation. He was found to have severe obstructive sleep apnea on the baseline portion of his split-night sleep evaluation with significant oxygen desaturation to a nadir of 72% during REM sleep.  He has been on CPAP therapy since his set up date of August 23, 2016.  Over the years, he has continued to be compliant with CPAP use and has required near maximal pressure with previous downloads in the past showing a 95th percentile pressure of 19.7-19.8.  He has done well with changing his mask to a ResMed F30i mask.  He underwent insertion of a permanent pacemaker secondary to prolonged pauses and syncope.  He has continued to use CPAP presently and his download from September 22 through December 06, 2022 shows 100% compliance with average use of 7 hours and 54 minutes.  AHI is excellent at 1.7 and his 95th percentile pressure is now 17.4 with maximum average pressure of 18.6.  There is minimal mask leak with his F30i mask.  He apparently received a new ResMed AirSense 11 CPAP auto machine with a similar pressure data.  His machine, however has not yet been linked to our office.  He is sleeping well.  He typically sleeps between 8 and 9 hours per night.  On the download up until February 4 with his old machine, AHI was 1.4 and his 95th percentile pressure was 17.2 with maximum average pressure 18.3.  Blood pressure today was slightly elevated.  Apparently he was taken off lisinopril by his primary physician Dr. Mechele Claude.  He continues to be in atrial fibrillation and is anticoagulated with Eliquis 5 mg twice a day without bleeding.   He is on methotrexate for psoriasis, metformin for diabetes mellitus, rosuvastatin for hyperlipidemia.  He will follow-up with Dr. Antoine Poche for cardiology care and he sees Dr. Royann Shivers for pacemaker follow-up.  I discussed with him my plans for retirement this year.  He will transition to Dr. Mayford Knife for sleep  management or another sleep provider as needed.     Medication Adjustments/Labs and Tests Ordered: Current medicines are reviewed at length with the patient today.  Concerns regarding medicines are outlined above.  Medication changes, Labs and Tests ordered today are listed in the Patient Instructions below. Patient Instructions  Medication Instructions:  No medication changes were made during today's visit.  *If you need a refill on your cardiac medications before your next appointment, please call your pharmacy*   Lab Work: No labs were ordered during today's visit.  If you have labs (blood work) drawn today and your tests are completely normal, you will receive your results only by: MyChart Message (if you have MyChart) OR A paper copy in the mail If you have any lab test that is abnormal or we need to change your treatment, we will call you to review the results.   Testing/Procedures: No procedures were ordered during today's visit.    Follow-Up: At [facility], you and your health needs are our priority.  As part of our continuing mission to provide you with exceptional heart care, we have created designated Provider Care Teams.  These Care Teams include your primary Cardiologist (physician) and Advanced Practice Providers (APPs -  Physician Assistants and Nurse Practitioners) who all work together to provide you with the care you need, when you need it.  We recommend signing up for the patient portal called "MyChart".  Sign up information is provided on this After Visit Summary.  MyChart is used to connect with patients for Virtual Visits (Telemedicine).  Patients  are able to view lab/test results, encounter notes, upcoming appointments, etc.  Non-urgent messages can be sent to your provider as well.   To learn more about what you can do with MyChart, go to [email or web address].    Your next appointment:   As needed for sleep   Provider:   Dr. Armanda Magic     Other Instructions        1st Floor: - Lobby - Registration  - Pharmacy  - Lab - Cafe   2nd Floor: - PV Lab - Diagnostic Testing (echo, CT, nuclear med)   3rd Floor: - Vacant   4th Floor: - TCTS (cardiothoracic surgery) - AFib Clinic - Structural Heart Clinic - Vascular Surgery  - Vascular Ultrasound   5th Floor: - HeartCare Cardiology (general and EP) - Clinical Pharmacy for coumadin, hypertension, lipid, weight-loss medications, and med management appointments      Valet parking services will be available as well.       Thank you for choosing [facility]!       Signed, Nicki Guadalajara, MD , [facility], ABSM Diplomate, American Board of Sleep Medicine  05/01/2023 11:31 AM    [facility] [address], [address], [place], [place]  [address] Phone: [phone]

## 2023-04-25 NOTE — Patient Instructions (Signed)
 Medication Instructions:  No medication changes were made during today's visit.  *If you need a refill on your cardiac medications before your next appointment, please call your pharmacy*   Lab Work: No labs were ordered during today's visit.  If you have labs (blood work) drawn today and your tests are completely normal, you will receive your results only by: MyChart Message (if you have MyChart) OR A paper copy in the mail If you have any lab test that is abnormal or we need to change your treatment, we will call you to review the results.   Testing/Procedures: No procedures were ordered during today's visit.    Follow-Up: At [facility], you and your health needs are our priority.  As part of our continuing mission to provide you with exceptional heart care, we have created designated Provider Care Teams.  These Care Teams include your primary Cardiologist (physician) and Advanced Practice Providers (APPs -  Physician Assistants and Nurse Practitioners) who all work together to provide you with the care you need, when you need it.  We recommend signing up for the patient portal called "MyChart".  Sign up information is provided on this After Visit Summary.  MyChart is used to connect with patients for Virtual Visits (Telemedicine).  Patients are able to view lab/test results, encounter notes, upcoming appointments, etc.  Non-urgent messages can be sent to your provider as well.   To learn more about what you can do with MyChart, go to [email or web address].    Your next appointment:   As needed for sleep   Provider:   Dr. Armanda Magic     Other Instructions        1st Floor: - Lobby - Registration  - Pharmacy  - Lab - Cafe   2nd Floor: - PV Lab - Diagnostic Testing (echo, CT, nuclear med)   3rd Floor: - Vacant   4th Floor: - TCTS (cardiothoracic surgery) - AFib Clinic - Structural Heart Clinic - Vascular Surgery  - Vascular Ultrasound   5th  Floor: - HeartCare Cardiology (general and EP) - Clinical Pharmacy for coumadin, hypertension, lipid, weight-loss medications, and med management appointments      Valet parking services will be available as well.       Thank you for choosing [facility]!

## 2023-04-27 ENCOUNTER — Encounter: Payer: Self-pay | Admitting: Family Medicine

## 2023-04-27 ENCOUNTER — Ambulatory Visit: Payer: Medicare Other | Admitting: Family Medicine

## 2023-04-27 VITALS — BP 144/65 | HR 56 | Temp 97.7°F | Ht 71.0 in | Wt 195.0 lb

## 2023-04-27 DIAGNOSIS — N401 Enlarged prostate with lower urinary tract symptoms: Secondary | ICD-10-CM

## 2023-04-27 DIAGNOSIS — E785 Hyperlipidemia, unspecified: Secondary | ICD-10-CM | POA: Diagnosis not present

## 2023-04-27 DIAGNOSIS — I10 Essential (primary) hypertension: Secondary | ICD-10-CM | POA: Diagnosis not present

## 2023-04-27 DIAGNOSIS — I482 Chronic atrial fibrillation, unspecified: Secondary | ICD-10-CM

## 2023-04-27 DIAGNOSIS — R310 Gross hematuria: Secondary | ICD-10-CM | POA: Diagnosis not present

## 2023-04-27 DIAGNOSIS — R350 Frequency of micturition: Secondary | ICD-10-CM | POA: Diagnosis not present

## 2023-04-27 DIAGNOSIS — R7989 Other specified abnormal findings of blood chemistry: Secondary | ICD-10-CM | POA: Diagnosis not present

## 2023-04-27 DIAGNOSIS — E1140 Type 2 diabetes mellitus with diabetic neuropathy, unspecified: Secondary | ICD-10-CM | POA: Diagnosis not present

## 2023-04-27 DIAGNOSIS — E119 Type 2 diabetes mellitus without complications: Secondary | ICD-10-CM

## 2023-04-27 LAB — LIPID PANEL

## 2023-04-27 LAB — BAYER DCA HB A1C WAIVED: HB A1C (BAYER DCA - WAIVED): 5.6 % (ref 4.8–5.6)

## 2023-04-27 MED ORDER — CLOBETASOL PROPIONATE 0.05 % EX CREA: 1.0000 | TOPICAL_CREAM | Freq: Two times a day (BID) | CUTANEOUS | 1 refills | Status: AC | PRN

## 2023-04-27 MED ORDER — METFORMIN HCL ER 500 MG PO TB24: ORAL_TABLET | ORAL | 3 refills | Status: AC

## 2023-04-27 MED ORDER — ROSUVASTATIN CALCIUM 5 MG PO TABS: 5.0000 mg | ORAL_TABLET | Freq: Every day | ORAL | 3 refills | Status: AC

## 2023-04-27 NOTE — Progress Notes (Signed)
 Subjective:  Patient ID: [name],  male    DOB: September 30, 1935  Age: 88 y.o.    CC: Medical Management of Chronic Issues and Other (Spot on the back on the back of right leg. Near goes away. Not bothersome. )   HPI [name] presents for  follow-up of hypertension. Patient has no history of headache chest pain or shortness of breath or recent cough. Patient also denies symptoms of TIA such as numbness weakness lateralizing. Patient denies side effects from medication. States taking it regularly.  Patient also  in for follow-up of elevated cholesterol. Doing well without complaints on current medication. Denies side effects  including myalgia and arthralgia and nausea. Also in today for liver function testing. Currently no chest pain, shortness of breath or other cardiovascular related symptoms noted.  Follow-up of diabetes. Patient does check blood sugar at home. Readings run between 100 and 150 Patient denies symptoms such as excessive hunger or urinary frequency, excessive hunger, nausea No significant hypoglycemic spells noted. Medications reviewed. Pt reports taking them regularly. Pt. denies complication/adverse reaction today.    History Ansel has a past medical history of Arthritis, Atrial fibrillation (HCC), BPH (benign prostatic hyperplasia), Cataracts, bilateral, Coronary artery disease excluded, Diabetes mellitus without complication (HCC), Diverticulosis, Endocarditis, valve unspecified, unspecified cause, GERD (gastroesophageal reflux disease), H/O mitral valve repair, Hearing loss, Heart murmur, Hiatal hernia (06/13/2002), High cholesterol, History of kidney stones, OSA on CPAP, Sleep apnea, Stroke (HCC), Unspecified essential hypertension, Weakness of both arms (07/19/2012), and Wears dentures.   He has a past surgical history that includes Vasectomy; Mitral valve repair (~ 2003); Cataract extraction w/PHACO (Right, 05/12/2015); Cataract extraction w/PHACO (Left,  06/12/2015); Total knee arthroplasty (Left, 02/20/2016); Injection knee (Right, 02/20/2016); Cardioversion (N/A, 04/14/2016); Laparoscopic cholecystectomy; Knee arthroscopy (Left); Joint replacement; Shoulder open rotator cuff repair (Left); Colonoscopy; Upper gi endoscopy; Multiple tooth extractions; Cardiac catheterization (02/14/2007); Shoulder arthroscopy with rotator cuff repair and open biceps tenodesis (Right, 01/26/2019); Eye surgery; Total knee arthroplasty (Right, 07/06/2019); Esophagogastroduodenoscopy (N/A, 05/21/2021); ERCP (N/A, 05/21/2021); maloney dilation (N/A, 05/21/2021); sphincterotomy (05/21/2021); removal of stones (05/21/2021); Esophageal dilation (N/A, 05/21/2021); ERCP (N/A, 05/21/2021); Colonoscopy with propofol (N/A, 01/21/2022); Esophagogastroduodenoscopy (egd) with propofol (N/A, 01/21/2022); Esophageal dilation (01/21/2022); biopsy (01/21/2022); and PACEMAKER IMPLANT (N/A, 09/23/2022).   His family history includes Congestive Heart Failure in his father; Diabetes Mellitus II in his brother and sister; Heart disease in his father; Lung cancer in his brother; Stroke in his brother, brother, sister, and sister.He reports that he quit smoking about 35 years ago. His smoking use included cigarettes. He started smoking about 70 years ago. He has a 105 pack-year smoking history. He has been exposed to tobacco smoke. He has never used smokeless tobacco. He reports that he does not drink alcohol and does not use drugs.  Current Outpatient Medications on File Prior to Visit  Medication Sig Dispense Refill   Accu-Chek Softclix Lancets lancets Test BS in the morning, at noon and at bedtime Dx E11.40 300 each 3   apixaban (ELIQUIS) 5 MG TABS tablet Take 1 tablet (5 mg total) by mouth 2 (two) times daily. 180 tablet 3   Bacillus Coagulans-Inulin (ALIGN PREBIOTIC-PROBIOTIC PO) Take 1 capsule by mouth daily.     Blood Glucose Monitoring Suppl DEVI 1 each by Does not apply route in the morning, at noon, and at bedtime.  May substitute to any manufacturer covered by patient's insurance. 1 each 0   Calcium Carb-Cholecalciferol (CALCIUM 600 + D PO) Take 1 tablet  by mouth daily.     Coenzyme Q10 300 MG CAPS Take 300 mg by mouth daily.     colestipol (COLESTID) 1 g tablet Take 4 tablets (4 g total) by mouth daily. take it 4 hours apart from his other medications 360 tablet 3   finasteride (PROSCAR) 5 MG tablet Take 5 mg by mouth daily.     fluticasone (CUTIVATE) 0.05 % cream Apply 1 Application topically 2 (two) times daily as needed (Psoriasis).     folic acid (FOLVITE) 1 MG tablet Take 1 mg by mouth daily.     Glucose Blood (BLOOD GLUCOSE TEST STRIPS) STRP 1 each by In Vitro route in the morning, at noon, and at bedtime. May substitute to any manufacturer covered by patient's insurance. 100 strip PRN   methotrexate (RHEUMATREX) 2.5 MG tablet Take 5 mg by mouth once a week.     Multiple Vitamin (MULTIVITAMIN WITH MINERALS) TABS tablet Take 1 tablet by mouth daily.     mupirocin ointment (BACTROBAN) 2 % Apply topically 2 (two) times daily.     omeprazole (PRILOSEC) 40 MG capsule Take 1 capsule (40 mg total) by mouth daily. 90 capsule 3   silodosin (RAPAFLO) 4 MG CAPS capsule Take 1 capsule (4 mg total) by mouth daily with breakfast. 90 capsule 3   triamcinolone cream (KENALOG) 0.1 % Apply 1 Application topically daily as needed (Psoriasis).     No current facility-administered medications on file prior to visit.    ROS Review of Systems  Constitutional:  Negative for fever.  Respiratory:  Negative for shortness of breath.   Cardiovascular:  Negative for chest pain.  Musculoskeletal:  Negative for arthralgias.  Skin:  Negative for rash.    Objective:  BP (!) 144/65   Pulse (!) 56   Temp 97.7 F (36.5 C)   Ht 5\' 11"  (1.803 m)   Wt 195 lb (88.5 kg)   SpO2 97%   BMI 27.20 kg/m   BP Readings from Last 3 Encounters:  04/27/23 (!) 144/65  04/25/23 (!) 146/70  03/22/23 (!) 150/70    Wt Readings from  Last 3 Encounters:  04/27/23 195 lb (88.5 kg)  04/25/23 198 lb (89.8 kg)  03/22/23 192 lb 0.3 oz (87.1 kg)     Physical Exam Vitals reviewed.  Constitutional:      Appearance: He is well-developed.  HENT:     Head: Normocephalic and atraumatic.     Right Ear: External ear normal.     Left Ear: External ear normal.     Mouth/Throat:     Pharynx: No oropharyngeal exudate or posterior oropharyngeal erythema.  Eyes:     Pupils: Pupils are equal, round, and reactive to light.  Cardiovascular:     Rate and Rhythm: Normal rate and regular rhythm.     Heart sounds: No murmur heard. Pulmonary:     Effort: No respiratory distress.     Breath sounds: Normal breath sounds.  Musculoskeletal:     Cervical back: Normal range of motion and neck supple.  Neurological:     Mental Status: He is alert and oriented to person, place, and time.     Diabetic Foot Exam - Simple   No data filed     Lab Results  Component Value Date   HGBA1C 5.6 04/27/2023   HGBA1C 5.7 (H) 01/26/2023   HGBA1C 5.8 (H) 10/26/2022    Assessment & Plan:   Jlyn was seen today for medical management of chronic issues and other.  Diagnoses and all orders for this visit:  Type 2 diabetes mellitus with diabetic neuropathy, unspecified whether long term insulin use (HCC) -     Bayer DCA Hb A1c Waived  Essential hypertension -     CBC with Differential/Platelet  Dyslipidemia -     Lipid panel  Elevated liver function tests -     CMP14+EGFR  Chronic atrial fibrillation (HCC) -     CBC with Differential/Platelet -     CMP14+EGFR  Type 2 diabetes mellitus without complication, without long-term current use of insulin (HCC) -     rosuvastatin (CRESTOR) 5 MG tablet; Take 1 tablet (5 mg total) by mouth daily. For cholesterol  Benign prostatic hyperplasia with urinary frequency  Gross hematuria -     Urine Culture -     Urinalysis, Complete  Other orders -     metFORMIN (GLUCOPHAGE-XR) 500 MG 24 hr  tablet; TAKE 1 TABLET BY MOUTH EVERY DAY WITH BREAKFAST -     clobetasol cream (TEMOVATE) 0.05 %; Apply 1 Application topically 2 (two) times daily as needed (Psoriasis).   I am having Woodruff A. Christin Fudge maintain his multivitamin with minerals, Calcium Carb-Cholecalciferol (CALCIUM 600 + D PO), Coenzyme Q10, methotrexate, folic acid, fluticasone, triamcinolone cream, finasteride, colestipol, omeprazole, Blood Glucose Monitoring Suppl, BLOOD GLUCOSE TEST STRIPS, Bacillus Coagulans-Inulin (ALIGN PREBIOTIC-PROBIOTIC PO), apixaban, silodosin, Accu-Chek Softclix Lancets, mupirocin ointment, metFORMIN, rosuvastatin, and clobetasol cream.  Meds ordered this encounter  Medications   metFORMIN (GLUCOPHAGE-XR) 500 MG 24 hr tablet    Sig: TAKE 1 TABLET BY MOUTH EVERY DAY WITH BREAKFAST    Dispense:  90 tablet    Refill:  3   rosuvastatin (CRESTOR) 5 MG tablet    Sig: Take 1 tablet (5 mg total) by mouth daily. For cholesterol    Dispense:  90 tablet    Refill:  3   clobetasol cream (TEMOVATE) 0.05 %    Sig: Apply 1 Application topically 2 (two) times daily as needed (Psoriasis).    Dispense:  30 g    Refill:  1     Follow-up: Return in about 3 months (around 07/28/2023) for diabetes.  Mechele Claude, M.D.

## 2023-04-28 LAB — CBC WITH DIFFERENTIAL/PLATELET
Basophils Absolute: 0 10*3/uL (ref 0.0–0.2)
Basos: 0 %
EOS (ABSOLUTE): 0.1 10*3/uL (ref 0.0–0.4)
Eos: 2 %
Hematocrit: 42.2 % (ref 37.5–51.0)
Hemoglobin: 14.1 g/dL (ref 13.0–17.7)
Immature Grans (Abs): 0 10*3/uL (ref 0.0–0.1)
Immature Granulocytes: 0 %
Lymphocytes Absolute: 1.3 10*3/uL (ref 0.7–3.1)
Lymphs: 24 %
MCH: 32.5 pg (ref 26.6–33.0)
MCHC: 33.4 g/dL (ref 31.5–35.7)
MCV: 97 fL (ref 79–97)
Monocytes Absolute: 0.6 10*3/uL (ref 0.1–0.9)
Monocytes: 11 %
Neutrophils Absolute: 3.3 10*3/uL (ref 1.4–7.0)
Neutrophils: 63 %
Platelets: 240 10*3/uL (ref 150–450)
RBC: 4.34 x10E6/uL (ref 4.14–5.80)
RDW: 13.2 % (ref 11.6–15.4)
WBC: 5.2 10*3/uL (ref 3.4–10.8)

## 2023-04-28 LAB — CMP14+EGFR
ALT: 16 IU/L (ref 0–44)
AST: 17 IU/L (ref 0–40)
Albumin: 4.5 g/dL (ref 3.7–4.7)
Alkaline Phosphatase: 78 IU/L (ref 44–121)
BUN/Creatinine Ratio: 19 (ref 10–24)
BUN: 17 mg/dL (ref 8–27)
Bilirubin Total: 1 mg/dL (ref 0.0–1.2)
CO2: 27 mmol/L (ref 20–29)
Calcium: 9.2 mg/dL (ref 8.6–10.2)
Chloride: 104 mmol/L (ref 96–106)
Creatinine, Ser: 0.88 mg/dL (ref 0.76–1.27)
Globulin, Total: 2.7 g/dL (ref 1.5–4.5)
Glucose: 108 mg/dL — ABNORMAL HIGH (ref 70–99)
Potassium: 4.7 mmol/L (ref 3.5–5.2)
Sodium: 146 mmol/L — ABNORMAL HIGH (ref 134–144)
Total Protein: 7.2 g/dL (ref 6.0–8.5)
eGFR: 84 mL/min/{1.73_m2} (ref >59)

## 2023-04-28 LAB — LIPID PANEL
Cholesterol, Total: 104 mg/dL (ref 100–199)
HDL: 58 mg/dL (ref >39)
LDL CALC COMMENT:: 1.8 ratio (ref 0.0–5.0)
LDL Chol Calc (NIH): 32 mg/dL (ref 0–99)
Triglycerides: 64 mg/dL (ref 0–149)
VLDL Cholesterol Cal: 14 mg/dL (ref 5–40)

## 2023-04-30 ENCOUNTER — Encounter: Payer: Self-pay | Admitting: Family Medicine

## 2023-04-30 NOTE — Progress Notes (Signed)
Hello [name],  Your lab result is normal and/or stable.Some minor variations that are not significant are commonly marked abnormal, but do not represent any medical problem for you.  Best regards, Helon Wisinski, M.D.

## 2023-05-01 ENCOUNTER — Encounter: Payer: Self-pay | Admitting: Cardiovascular Disease

## 2023-05-04 NOTE — Progress Notes (Signed)
 Remote pacemaker transmission.

## 2023-05-04 NOTE — Addendum Note (Signed)
 Addended by: Elease Etienne A on: 05/04/2023 04:40 PM   Modules accepted: Orders

## 2023-05-12 DIAGNOSIS — D485 Neoplasm of uncertain behavior of skin: Secondary | ICD-10-CM | POA: Diagnosis not present

## 2023-05-12 DIAGNOSIS — B9689 Other specified bacterial agents as the cause of diseases classified elsewhere: Secondary | ICD-10-CM | POA: Diagnosis not present

## 2023-05-12 DIAGNOSIS — L400 Psoriasis vulgaris: Secondary | ICD-10-CM | POA: Diagnosis not present

## 2023-05-12 DIAGNOSIS — L0202 Furuncle of face: Secondary | ICD-10-CM | POA: Diagnosis not present

## 2023-06-09 LAB — HM DIABETES EYE EXAM

## 2023-06-23 ENCOUNTER — Ambulatory Visit (INDEPENDENT_AMBULATORY_CARE_PROVIDER_SITE_OTHER): Payer: Medicare Other | Admitting: Gastroenterology

## 2023-06-23 VITALS — BP 143/75 | HR 50 | Temp 97.1°F | Ht 71.0 in | Wt 198.4 lb

## 2023-06-23 DIAGNOSIS — K219 Gastro-esophageal reflux disease without esophagitis: Secondary | ICD-10-CM

## 2023-06-23 DIAGNOSIS — R1310 Dysphagia, unspecified: Secondary | ICD-10-CM | POA: Diagnosis not present

## 2023-06-23 DIAGNOSIS — K529 Noninfective gastroenteritis and colitis, unspecified: Secondary | ICD-10-CM

## 2023-06-23 NOTE — Patient Instructions (Signed)
-  continue colestipol  4g daily, take 4 hours apart from other meds -be mindful of very cold liquids/or foods  as this can sometimes cause esophageal spasms -continue omeprazole  40mg  daily -be mindful of greasy, spicy, fried, citrus foods, caffeine, carbonated drinks, chocolate and alcohol as these can increase reflux symptoms Stay upright 2-3 hours after eating, prior to lying down and avoid eating late in the evenings.   Follow up 1 year  It was a pleasure to see you today. I want to create trusting relationships with patients and provide genuine, compassionate, and quality care. I truly value your feedback! please be on the lookout for a survey regarding your visit with me today. I appreciate your input about our visit and your time in completing this!    Michail Boyte L. Charlsie Fleeger, MSN, APRN, AGNP-C Adult-Gerontology Nurse Practitioner [facility] at [facility]

## 2023-06-23 NOTE — Progress Notes (Addendum)
 Referring Provider: Roise Cleaver, MD Primary Care Physician:  Roise Cleaver, MD Primary GI Physician: Dr. Sammi Crick   Chief Complaint  Patient presents with   Diarrhea    Follow up on diarrhea. Takes colestipol . States he still has diarrhea about half of the days.    Gastroesophageal Reflux    Follow up on GERD. Takes omeprazole . Wonders if he needs to keep taking med.does have indigestion at times.    HPI:   [name] is a 88 y.o. male with past medical history of atrial fibrillation, BPH, diabetes, stroke, OSA, hyperlipidemia, GERD, hypertension   Patient presenting today for:  Follow up of diarrhea, GERD, dysphagia   Last seen may 2024, on colestipol  increase to 4g daily. Stools more formed, sometimes mushy. 2-4 BMs per day. Mild fecal soiling. Having some dysphagia.   Recommended schedule BPE with pill, continue colestipol , omeprazole  40mg  daily  BPE was not completed   Present:  Having 3-5 BMs per day, about half looser/pudding like stools, and the other half are more formed. No watery stools. Has some fecal seepage at times when passing gas, some urgency at times. No abdominal pain. No blood in stools or black stools. Will sometimes skip his dose if he is going to be out as he tries to take this apart from his other medications as directed. He note sometimes having some sensation of discomfort in his epigastric region after taking his colestipol , notes this only last a few minutes. He is taking this around 12 pm. Denies any other episodes of heartburn or acid regurgitation. Does not feel he is having much issues with his swallowing currently. Very rarely feels food goes down slow. Occasionally will have a pain/discomfort in his chest if he drinks cold water  too fast.    Pertinent history: Celiac serology, fecal fat, TSH, WNL pancreatic elastase was decreased at 108.   given a prescription for possible EPI with Creon , however this was not covered  Zenpep  caused him  to develop a rash a few months after starting and he was advised to stop taking the medication. Last EGD: 01/21/2022. No esophageal abnormalities, empirically dilated with an 18 mm savory.  Biopsies from esophagus suggestive of reflux changes.  2 cm hiatal hernia, normal stomach and duodenum. 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, normal colon (nonspecific colitis), diverticulosis   Past Medical History:  Diagnosis Date   Arthritis    knees, shoulder,  hips (10/24/2017)   Atrial fibrillation (HCC)    on eliquis    BPH (benign prostatic hyperplasia)    Cataracts, bilateral    removed by surgery   Coronary artery disease excluded    Diabetes mellitus without complication (HCC)    type 2   Diverticulosis    Endocarditis, valve unspecified, unspecified cause    GERD (gastroesophageal reflux disease)    H/O mitral valve repair    Postoperative ring with postoperative atrial fibrillation   Hearing loss    both ears - does not use hearing aids   Heart murmur    hx   Hiatal hernia 06/13/2002   High cholesterol    History of kidney stones    passed spontaneously x2    OSA on CPAP    uses cpap nightly   Sleep apnea    Stroke [facility])    Unspecified essential hypertension    Weakness of both arms 07/19/2012   Wears dentures    upper and lower partial    Past Surgical History:  Procedure  Laterality Date   BIOPSY  01/21/2022   Procedure: BIOPSY;  Surgeon: Urban Garden, MD;  Location: [facility] ENDO SU[facility];  Service: Gastroenterology;;   CARDIAC CATHETERIZATION  02/14/2007   x 2   CARDIOVERSION N/A 04/14/2016   Procedure: CARDIOVERSION;  Surgeon: Eilleen Grates, MD;  Location: [facility] ENDOSCOPY;  Service: Cardiovascular;  Laterality: N/A;   CATARACT EXTRACTION W/PHACO Right 05/12/2015   Procedure: CATARACT EXTRACTION PHACO AND INTRAOCULAR LENS PLACEMENT RIGHT EYE CDE=7.75;  Surgeon: Anner Kill, MD;  Location: [facility] ORS;  Service: Ophthalmology;  Laterality: Right;   CATARACT  EXTRACTION W/PHACO Left 06/12/2015   Procedure: CATARACT EXTRACTION PHACO AND INTRAOCULAR LENS PLACEMENT (IOC);  Surgeon: Anner Kill, MD;  Location: [facility] ORS;  Service: Ophthalmology;  Laterality: Left;  CDE:  13.17   COLONOSCOPY     COLONOSCOPY WITH PROPOFOL  N/A 01/21/2022   Procedure: COLONOSCOPY WITH PROPOFOL ;  Surgeon: Urban Garden, MD;  Location: [facility] ENDO [facility]ITE;  Service: Gastroenterology;  Laterality: N/A;  115 ASA 3   ERCP N/A 05/21/2021   Procedure: ENDOSCOPIC RETROGRADE CHOLANGIOPANCREATOGRAPHY (ERCP);  Surgeon: Ruby Corporal, MD;  Location: [facility] ORS;  Service: Gastroenterology;  Laterality: N/A;   ERCP N/A 05/21/2021   Procedure: ENDOSCOPIC RETROGRADE CHOLANGIOPANCREATOGRAPHY (ERCP);  Surgeon: Ruby Corporal, MD;  Location: [facility] ORS;  Service: Gastroenterology;  Laterality: N/A;   ESOPHAGEAL DILATION N/A 05/21/2021   Procedure: ESOPHAGEAL DILATION;  Surgeon: Ruby Corporal, MD;  Location: [facility] ORS;  Service: Gastroenterology;  Laterality: N/A;   ESOPHAGEAL DILATION  01/21/2022   Procedure: ESOPHAGEAL DILATION;  Surgeon: Urban Garden, MD;  Location: [facility] END[facility];  Service: Gastroenterology;;   ESOPHAGOGASTRODUODENOSCOPY N/A 05/21/2021   Procedure: ESOPHAGOGASTRODUODENOSCOPY (EGD);  Surgeon: Ruby Corporal, MD;  Location: [facility] ORS;  Service: Gastroenterology;  Laterality: N/A;   ESOPHAGOGASTRODUODENOSCOPY (EGD) WITH PROPOFOL  N/A 01/21/2022   Procedure: ESOPHAGOGASTRODUODENOSCOPY (EGD) WITH PROPOFOL ;  Surgeon: Urban Garden, MD;  Location: [facility];  Service: Gastroenterology;  Laterality: N/A;   EYE SURGERY     Bilateral cataracts   INJECTION KNEE Right 02/20/2016   Procedure: KNEE INJECTION;  Surgeon: Adah Acron, MD;  Location[facility] OR;  Service: Orthopedics;  Laterality: Right;   JOINT REPLACEMENT     KNEE ARTHROSCOPY Left    LAPAROSCOPIC CHOLECYSTECTOMY     MALONEY DILATION N/A 05/21/2021   Procedure: MALONEY DILATION;  Surgeon: Ruby Corporal, MD;   Location: [facility] ORS;  Service: Gastroenterology;  Laterality: N/A;   MITRAL VALVE REPAIR  ~ 2003   MULTIPLE TOOTH EXTRACTIONS     PACEMAKER IMPLANT N/A 09/23/2022   Procedure: PACEMAKER IMPLANT;  Surgeon: Luana Rumple, MD;  Location: [facility] INVASIVE CV LAB;  Service: Cardiovascular;  Laterality: N/A;   REMOVAL OF STONES  05/21/2021   Procedure: REMOVAL OF STONES;  Surgeon: Ruby Corporal, MD;  Location: [facility] ORS;  Service: Gastroenterology;;   SHOULDER ARTHROSCOPY WITH ROTATOR CUFF REPAIR AND OPEN BICEPS TENODESIS Right 01/26/2019   Procedure: right shoulder arthroscopy, rotator cuff repair and biceps tenodesis;  Surgeon: Adah Acron, MD;  Location: [facility] OR;  Service: Orthopedics;  Laterality: Right;   SHOULDER OPEN ROTATOR CUFF REPAIR Left    SPHINCTEROTOMY  05/21/2021   Procedure: SPHINCTEROTOMY;  Surgeon: Ruby Corporal, MD;  Location: [facility] ORS;  Service: Gastroenterology;;   TOTAL KNEE ARTHROPLASTY Left 02/20/2016   Procedure: LEFT TOTAL KNEE ARTHROPLASTY WITH RIGHT KNEE INJECTION;  Surgeon: Adah Acron, MD;  Location: [facility] OR;  Service: Orthopedics;  Laterality: Left;   TOTAL KNEE  ARTHROPLASTY Right 07/06/2019   Procedure: RIGHT TOTAL KNEE ARTHROPLASTY;  Surgeon: Adah Acron, MD;  Location: [facility] OR;  Service: Orthopedics;  Laterality: Right;   UPPER GI ENDOSCOPY     VASECTOMY      Current Outpatient Medications  Medication Sig Dispense Refill   Accu-Chek Softclix Lancets lancets Test BS in the morning, at noon and at bedtime Dx E11.40 300 each 3   apixaban  (ELIQUIS ) 5 MG TABS tablet Take 1 tablet (5 mg total) by mouth 2 (two) times daily. 180 tablet 3   Bacillus Coagulans-Inulin (ALIGN PREBIOTIC-PROBIOTIC PO) Take 1 capsule by mouth daily.     Blood Glucose Monitoring Suppl DEVI 1 each by Does not apply route in the morning, at noon, and at bedtime. May substitute to any manufacturer covered by patient's insurance. 1 each 0   Calcium  Carb-Cholecalciferol (CALCIUM  600 + D PO) Take 1 tablet by mouth  daily.     clobetasol  cream (TEMOVATE ) 0.05 % Apply 1 Application topically 2 (two) times daily as needed (Psoriasis). 30 g 1   Coenzyme Q10 300 MG CAPS Take 300 mg by mouth daily.     colestipol  (COLESTID ) 1 g tablet Take 4 tablets (4 g total) by mouth daily. take it 4 hours apart from his other medications 360 tablet 3   finasteride (PROSCAR) 5 MG tablet Take 5 mg by mouth daily.     fluticasone (CUTIVATE) 0.05 % cream Apply 1 Application topically 2 (two) times daily as needed (Psoriasis).     folic acid (FOLVITE) 1 MG tablet Take 1 mg by mouth daily.     Glucose Blood (BLOOD GLUCOSE TEST STRIPS) STRP 1 each by In Vitro route in the morning, at noon, and at bedtime. May substitute to any manufacturer covered by patient's insurance. 100 strip PRN   metFORMIN  (GLUCOPHAGE -XR) 500 MG 24 hr tablet TAKE 1 TABLET BY MOUTH EVERY DAY WITH BREAKFAST 90 tablet 3   Multiple Vitamin (MULTIVITAMIN WITH MINERALS) TABS tablet Take 1 tablet by mouth daily.     omeprazole  (PRILOSEC) 40 MG capsule Take 1 capsule (40 mg total) by mouth daily. 90 capsule 3   rosuvastatin  (CRESTOR ) 5 MG tablet Take 1 tablet (5 mg total) by mouth daily. For cholesterol 90 tablet 3   silodosin  (RAPAFLO ) 4 MG CAPS capsule Take 1 capsule (4 mg total) by mouth daily with breakfast. 90 capsule 3   triamcinolone cream (KENALOG) 0.1 % Apply 1 Application topically daily as needed (Psoriasis).     No current facility-administered medications for this visit.    Allergies as of 06/23/2023 - Review Complete 06/23/2023  Allergen Reaction Noted   Flomax [tamsulosin hcl] Other (See Comments) 05/07/2015    Social History   Socioeconomic History   Marital status: Married    Spouse name: GLORIA   Number of children: 2   Years of education: 12   Highest education level: Tax adviser degree: occupational, Scientist, product/process development, or vocational program  Occupational History   Occupation:  Neurosurgeon    Comment: RETIRED  Tobacco Use   Smoking  status: Former    Current packs/day: 0.00    Average packs/day: 3.0 packs/day for 35.0 years (105.0 ttl pk-yrs)    Types: Cigarettes    Start date: 02/15/1953    Quit date: 02/16/1988    Years since quitting: 35.3    Passive exposure: Past   Smokeless tobacco: Never   Tobacco comments:     Year Quit: 1990   Vaping Use   Vaping  status: Never Used  Substance and Sexual Activity   Alcohol use: No   Drug use: Never   Sexual activity: Not Currently  Other Topics Concern   Not on file  Social History Narrative   Not on file   Social Drivers of Health   Financial Resource Strain: Low Risk  (04/23/2023)   Overall Financial Resource Strain (CARDIA)    Difficulty of Paying Living Expenses: Not hard at all  Food Insecurity: No Food Insecurity (04/23/2023)   Hunger Vital Sign    Worried About Running Out of Food in the Last Year: Never true    Ran Out of Food in the Last Year: Never true  Transportation Needs: No Transportation Needs (04/23/2023)   PRAPARE - Administrator, Civil Service (Medical): No    Lack of Transportation (Non-Medical): No  Physical Activity: Sufficiently Active (04/23/2023)   Exercise Vital Sign    Days of Exercise per Week: 3 days    Minutes of Exercise per Session: 50 min  Stress: No Stress Concern Present (04/23/2023)   [employer] of Occupational Health - Occupational Stress Questionnaire    Feeling of Stress : Not at all  Social Connections: Socially Integrated (04/23/2023)   Social Connection and Isolation Panel [NHANES]    Frequency of Communication with Friends and Family: More than three times a week    Frequency of Social Gatherings with Friends and Family: Once a week    Attends Religious Services: More than 4 times per year    Active Member of [employer] or Organizations: Yes    Attends Engineer, structural: More than 4 times per year    Marital Status: Married    Review of systems General: negative for malaise, night sweats, fever,  chills, weight loss Neck: Negative for lumps, goiter, pain and significant neck swelling Resp: Negative for cough, wheezing, dyspnea at rest CV: Negative for chest pain, leg swelling, palpitations, orthopnea GI: denies melena, hematochezia, nausea, vomiting, constipation, dysphagia, odyonophagia, early satiety or unintentional weight loss. +looser stools, +fecal urgency/leakage  The remainder of the review of systems is noncontributory.  Physical Exam: There were no vitals taken for this visit. General:   Alert and oriented. No distress noted. Pleasant and cooperative.  Head:  Normocephalic and atraumatic. Eyes:  Conjuctiva clear without scleral icterus. Mouth:  Oral mucosa pink and moist. Good dentition. No lesions. Heart: Normal rate and rhythm, s1 and s2 heart sounds present.  Lungs: Clear lung sounds in all lobes. Respirations equal and unlabored. Abdomen:  +BS, soft, non-tender and non-distended. No rebound or guarding. No HSM or masses noted. Ventral hernia, soft, non tender Neurologic:  Alert and  oriented x4 Psych:  Alert and cooperative. Normal mood and affect.  Invalid input(s): "6 MONTHS"   ASSESSMENT: [name] is a 89 y.o. male presenting today for follow up of chronic diarrhea, GERD, dysphagia   Diarrhea: Patient with for evaluation of his chronic diarrhea that showed nonspecific colitis but no chronic changes on biopsies.  He has had significant improvement of diarrhea colestipol .  Notably had elevated pancreatic elastase though had a reaction to Zenpep  and Creon  was not covered.  Will will continue with current dose of colestipol  as this has led to significant improvement in his diarrhea, he has no red flag symptoms at this time.  GERD/dysphagia: GERD on manage on omeprazole  40 mg daily.  Denies any real issues with dysphagia first time, noting only very occasional slow passage of food.  He has  occasional esophageal pain when he drinks very cold liquids which I  suspect is an esophageal spasm, denies any episodes at this otherwise.  Recommend continue PPI daily, good reflux precautions, avoid extremely cold liquids or foods as this can precipitate esophageal spasms.  He should let me know if symptoms worsen.   PLAN:  -continue colestipol  4g daily, take 4 hours apart from other meds -be mindful of very cold liquids/or foods -continue omeprazole  40mg  daily -good reflux precautions   All questions were answered, patient verbalized understanding and is in agreement with plan as outlined above.   Follow Up: 1 year   Tama Grosz L. Adrien Alberta, MSN, APRN, AGNP-C Adult-Gerontology Nurse Practitioner [facility]  I have reviewed the note and agree with the APP's assessment as described in this progress note  Samantha Cress, MD Gastroenterology and Hepatology [place] [facility] Gastroenterology

## 2023-06-29 ENCOUNTER — Ambulatory Visit (INDEPENDENT_AMBULATORY_CARE_PROVIDER_SITE_OTHER): Payer: Medicare Other

## 2023-06-29 DIAGNOSIS — R55 Syncope and collapse: Secondary | ICD-10-CM | POA: Diagnosis not present

## 2023-06-29 LAB — CUP PACEART REMOTE DEVICE CHECK
Battery Remaining Longevity: 120 mo
Battery Remaining Percentage: 100 %
Brady Statistic RV Percent Paced: 54 %
Date Time Interrogation Session: 20250514012100
Implantable Lead Connection Status: 753985
Implantable Lead Implant Date: 20240808
Implantable Lead Location: 753860
Implantable Lead Model: 7842
Implantable Lead Serial Number: 1314359
Implantable Pulse Generator Implant Date: 20240808
Lead Channel Impedance Value: 613 Ohm
Lead Channel Setting Pacing Amplitude: 2 V
Lead Channel Setting Pacing Pulse Width: 0.4 ms
Lead Channel Setting Sensing Sensitivity: 3.5 mV
Pulse Gen Serial Number: 907855
Zone Setting Status: 755011

## 2023-06-30 ENCOUNTER — Ambulatory Visit: Payer: Self-pay | Admitting: Cardiovascular Disease

## 2023-07-05 ENCOUNTER — Other Ambulatory Visit (INDEPENDENT_AMBULATORY_CARE_PROVIDER_SITE_OTHER): Payer: Self-pay | Admitting: Gastroenterology

## 2023-07-05 DIAGNOSIS — K219 Gastro-esophageal reflux disease without esophagitis: Secondary | ICD-10-CM

## 2023-07-19 DIAGNOSIS — R310 Gross hematuria: Secondary | ICD-10-CM | POA: Diagnosis not present

## 2023-07-19 DIAGNOSIS — R3915 Urgency of urination: Secondary | ICD-10-CM | POA: Diagnosis not present

## 2023-07-19 DIAGNOSIS — R3914 Feeling of incomplete bladder emptying: Secondary | ICD-10-CM | POA: Diagnosis not present

## 2023-08-01 ENCOUNTER — Encounter: Payer: Self-pay | Admitting: Family Medicine

## 2023-08-01 ENCOUNTER — Ambulatory Visit (INDEPENDENT_AMBULATORY_CARE_PROVIDER_SITE_OTHER): Admitting: Family Medicine

## 2023-08-01 VITALS — BP 121/59 | HR 79 | Temp 98.0°F | Ht 71.0 in | Wt 196.0 lb

## 2023-08-01 DIAGNOSIS — E1140 Type 2 diabetes mellitus with diabetic neuropathy, unspecified: Secondary | ICD-10-CM | POA: Diagnosis not present

## 2023-08-01 DIAGNOSIS — I482 Chronic atrial fibrillation, unspecified: Secondary | ICD-10-CM | POA: Diagnosis not present

## 2023-08-01 DIAGNOSIS — Z7984 Long term (current) use of oral hypoglycemic drugs: Secondary | ICD-10-CM

## 2023-08-01 DIAGNOSIS — E785 Hyperlipidemia, unspecified: Secondary | ICD-10-CM

## 2023-08-01 LAB — BAYER DCA HB A1C WAIVED: HB A1C (BAYER DCA - WAIVED): 5.6 % (ref 4.8–5.6)

## 2023-08-01 NOTE — Progress Notes (Signed)
 Subjective:  Patient ID: [name],  male    DOB: 02-06-1936  Age: 88 y.o.    CC: Diabetes (No concerns at this time. )   HPI [name] presents for  follow-up of hypertension. Patient has no history of headache chest pain or shortness of breath or recent cough. Patient also denies symptoms of TIA such as numbness weakness lateralizing. Patient denies side effects from medication. States taking it regularly.  Patient also  in for follow-up of elevated cholesterol. Doing well without complaints on current medication. Denies side effects  including myalgia and arthralgia and nausea. Also in today for liver function testing. Currently no chest pain, shortness of breath or other cardiovascular related symptoms noted.  Follow-up of diabetes. Patient does check blood sugar at home. Readings run between  90-111 fasting Patient denies symptoms such as excessive hunger or urinary frequency, excessive hunger, nausea No significant hypoglycemic spells noted. Medications reviewed. Pt reports taking them regularly. Pt. denies complication/adverse reaction today.    History [name] has a past medical history of Arthritis, Atrial fibrillation (HCC), BPH (benign prostatic hyperplasia), Cataracts, bilateral, Coronary artery disease excluded, Diabetes mellitus without complication (HCC), Diverticulosis, Endocarditis, valve unspecified, unspecified cause, GERD (gastroesophageal reflux disease), H/O mitral valve repair, Hearing loss, Heart murmur, Hiatal hernia (06/13/2002), High cholesterol, History of kidney stones, OSA on CPAP, Sleep apnea, Stroke (HCC), Unspecified essential hypertension, Weakness of both arms (07/19/2012), and Wears dentures.   He has a past surgical history that includes Vasectomy; Mitral valve repair (~ 2003); Cataract extraction w/PHACO (Right, 05/12/2015); Cataract extraction w/PHACO (Left, 06/12/2015); Total knee arthroplasty (Left, 02/20/2016); Injection knee (Right,  02/20/2016); Cardioversion (N/A, 04/14/2016); Laparoscopic cholecystectomy; Knee arthroscopy (Left); Joint replacement; Shoulder open rotator cuff repair (Left); Colonoscopy; Upper gi endoscopy; Multiple tooth extractions; Cardiac catheterization (02/14/2007); Shoulder arthroscopy with rotator cuff repair and open biceps tenodesis (Right, 01/26/2019); Eye surgery; Total knee arthroplasty (Right, 07/06/2019); Esophagogastroduodenoscopy (N/A, 05/21/2021); ERCP (N/A, 05/21/2021); maloney dilation (N/A, 05/21/2021); sphincterotomy (05/21/2021); removal of stones (05/21/2021); Esophageal dilation (N/A, 05/21/2021); ERCP (N/A, 05/21/2021); Colonoscopy with propofol  (N/A, 01/21/2022); Esophagogastroduodenoscopy (egd) with propofol  (N/A, 01/21/2022); Esophageal dilation (01/21/2022); biopsy (01/21/2022); and PACEMAKER IMPLANT (N/A, 09/23/2022).   His family history includes Congestive Heart Failure in his father; Diabetes Mellitus II in his brother and sister; Heart disease in his father; Lung cancer in his brother; Stroke in his brother, brother, sister, and sister.He reports that he quit smoking about 35 years ago. His smoking use included cigarettes. He started smoking about 70 years ago. He has a 105 pack-year smoking history. He has been exposed to tobacco smoke. He has never used smokeless tobacco. He reports that he does not drink alcohol and does not use drugs.  Current Outpatient Medications on File Prior to Visit  Medication Sig Dispense Refill   Accu-Chek Softclix Lancets lancets Test BS in the morning, at noon and at bedtime Dx E11.40 300 each 3   apixaban  (ELIQUIS ) 5 MG TABS tablet Take 1 tablet (5 mg total) by mouth 2 (two) times daily. 180 tablet 3   Bacillus Coagulans-Inulin (ALIGN PREBIOTIC-PROBIOTIC PO) Take 1 capsule by mouth daily.     Blood Glucose Monitoring Suppl DEVI 1 each by Does not apply route in the morning, at noon, and at bedtime. May substitute to any manufacturer covered by patient's insurance. 1 each 0    Calcium  Carb-Cholecalciferol (CALCIUM  600 + D PO) Take 1 tablet by mouth daily.     clobetasol  cream (TEMOVATE ) 0.05 % Apply 1 Application topically  2 (two) times daily as needed (Psoriasis). 30 g 1   Coenzyme Q10 300 MG CAPS Take 300 mg by mouth daily.     colestipol  (COLESTID ) 1 g tablet Take 4 tablets (4 g total) by mouth daily. take it 4 hours apart from his other medications 360 tablet 3   finasteride (PROSCAR) 5 MG tablet Take 5 mg by mouth daily.     fluticasone (CUTIVATE) 0.05 % cream Apply 1 Application topically 2 (two) times daily as needed (Psoriasis).     folic acid (FOLVITE) 1 MG tablet Take 1 mg by mouth daily.     Glucose Blood (BLOOD GLUCOSE TEST STRIPS) STRP 1 each by In Vitro route in the morning, at noon, and at bedtime. May substitute to any manufacturer covered by patient's insurance. 100 strip PRN   metFORMIN  (GLUCOPHAGE -XR) 500 MG 24 hr tablet TAKE 1 TABLET BY MOUTH EVERY DAY WITH BREAKFAST 90 tablet 3   Multiple Vitamin (MULTIVITAMIN WITH MINERALS) TABS tablet Take 1 tablet by mouth daily.     omeprazole  (PRILOSEC) 40 MG capsule TAKE 1 CAPSULE (40 MG TOTAL) BY MOUTH DAILY. 90 capsule 3   rosuvastatin  (CRESTOR ) 5 MG tablet Take 1 tablet (5 mg total) by mouth daily. For cholesterol 90 tablet 3   silodosin  (RAPAFLO ) 8 MG CAPS capsule Take 8 mg by mouth daily with breakfast.     triamcinolone cream (KENALOG) 0.1 % Apply 1 Application topically daily as needed (Psoriasis).     No current facility-administered medications on file prior to visit.    ROS Review of Systems  Constitutional:  Negative for fever.  Respiratory:  Negative for shortness of breath.   Cardiovascular:  Negative for chest pain.  Musculoskeletal:  Negative for arthralgias.  Skin:  Negative for rash.    Objective:  BP (!) 121/59   Pulse 79   Temp 98 F (36.7 C)   Ht 5' 11 (1.803 m)   Wt 196 lb (88.9 kg)   SpO2 96%   BMI 27.34 kg/m   BP Readings from Last 3 Encounters:  08/01/23 (!) 121/59   06/23/23 (!) 143/75  04/27/23 (!) 144/65    Wt Readings from Last 3 Encounters:  08/01/23 196 lb (88.9 kg)  06/23/23 198 lb 6.4 oz (90 kg)  04/27/23 195 lb (88.5 kg)    Lab Results  Component Value Date   HGBA1C 5.6 08/01/2023   HGBA1C 5.6 04/27/2023   HGBA1C 5.7 (H) 01/26/2023    Physical Exam Vitals reviewed.  Constitutional:      Appearance: He is well-developed.  HENT:     Head: Normocephalic and atraumatic.     Right Ear: External ear normal.     Left Ear: External ear normal.     Mouth/Throat:     Pharynx: No oropharyngeal exudate or posterior oropharyngeal erythema.   Eyes:     Pupils: Pupils are equal, round, and reactive to light.    Cardiovascular:     Rate and Rhythm: Normal rate and regular rhythm.     Heart sounds: No murmur heard. Pulmonary:     Effort: No respiratory distress.     Breath sounds: Normal breath sounds.   Musculoskeletal:     Cervical back: Normal range of motion and neck supple.   Neurological:     Mental Status: He is alert and oriented to person, place, and time.         Assessment & Plan:  Type 2 diabetes mellitus with diabetic neuropathy, unspecified whether long  term insulin  use (HCC) -     Bayer DCA Hb A1c Waived -     Microalbumin / creatinine urine ratio  Chronic atrial fibrillation (HCC) -     CMP14+EGFR -     Lipid panel  Dyslipidemia -     CMP14+EGFR -     Lipid panel    Follow-up: Return in about 6 months (around 01/31/2024).  Roise Cleaver, M.D.

## 2023-08-02 ENCOUNTER — Ambulatory Visit: Payer: Self-pay | Admitting: Family Medicine

## 2023-08-02 LAB — CMP14+EGFR
ALT: 13 IU/L (ref 0–44)
AST: 14 IU/L (ref 0–40)
Albumin: 4.2 g/dL (ref 3.7–4.7)
Alkaline Phosphatase: 71 IU/L (ref 44–121)
BUN/Creatinine Ratio: 17 (ref 10–24)
BUN: 15 mg/dL (ref 8–27)
Bilirubin Total: 0.8 mg/dL (ref 0.0–1.2)
CO2: 25 mmol/L (ref 20–29)
Calcium: 8.7 mg/dL (ref 8.6–10.2)
Chloride: 105 mmol/L (ref 96–106)
Creatinine, Ser: 0.86 mg/dL (ref 0.76–1.27)
Globulin, Total: 2.5 g/dL (ref 1.5–4.5)
Glucose: 100 mg/dL — ABNORMAL HIGH (ref 70–99)
Potassium: 4.2 mmol/L (ref 3.5–5.2)
Sodium: 144 mmol/L (ref 134–144)
Total Protein: 6.7 g/dL (ref 6.0–8.5)
eGFR: 85 mL/min/{1.73_m2} (ref >59)

## 2023-08-02 LAB — MICROALBUMIN / CREATININE URINE RATIO
Creatinine, Urine: 76.4 mg/dL
Microalb/Creat Ratio: 59 mg/g{creat} — ABNORMAL HIGH (ref 0–29)
Microalbumin, Urine: 45.4 ug/mL

## 2023-08-02 LAB — LIPID PANEL
Chol/HDL Ratio: 2 ratio (ref 0.0–5.0)
Cholesterol, Total: 102 mg/dL (ref 100–199)
HDL: 52 mg/dL (ref >39)
LDL Chol Calc (NIH): 35 mg/dL (ref 0–99)
Triglycerides: 72 mg/dL (ref 0–149)
VLDL Cholesterol Cal: 15 mg/dL (ref 5–40)

## 2023-08-02 NOTE — Progress Notes (Signed)
Hello [name],  Your lab result is normal and/or stable.Some minor variations that are not significant are commonly marked abnormal, but do not represent any medical problem for you.  Best regards, Helon Wisinski, M.D.

## 2023-08-07 ENCOUNTER — Other Ambulatory Visit (INDEPENDENT_AMBULATORY_CARE_PROVIDER_SITE_OTHER): Payer: Self-pay | Admitting: Gastroenterology

## 2023-08-07 DIAGNOSIS — K529 Noninfective gastroenteritis and colitis, unspecified: Secondary | ICD-10-CM

## 2023-08-09 NOTE — Progress Notes (Signed)
 Remote pacemaker transmission.

## 2023-08-16 ENCOUNTER — Ambulatory Visit: Payer: Self-pay

## 2023-08-16 NOTE — Telephone Encounter (Signed)
 Appointment tomorrow. LS

## 2023-08-16 NOTE — Telephone Encounter (Signed)
 FYI Only or Action Required?: FYI only for provider.  Patient was last seen in primary care on 08/01/2023 by Zollie Lowers, MD. Called Nurse Triage reporting boils . Symptoms began several days ago. Interventions attempted: Nothing. Symptoms are: gradually worsening.  Triage Disposition: See PCP When Office is Open (Within 3 Days)  Patient/caregiver understands and will follow disposition?: Yes            Copied from CRM [id number]. Topic: Clinical - Red Word Triage >> Aug 16, 2023  8:07 AM [name] wrote: Red Word that prompted transfer to Nurse Triage:  He has a place on his chest and it has gotten worse. It is red and he has pain in the area. Pain 5 or 6. He wants to make an appointment Reason for Disposition  Boil > 1/2 inch across (> 12 mm; larger than a marble)  Answer Assessment - Initial Assessment Questions 1. APPEARANCE of BOIL: What does the boil look like?      Red  2. LOCATION: Where is the boil located?      Right breast  3. NUMBER: How many boils are there?      1 4. SIZE: How big is the boil? (e.g., inches, cm; compare to size of a coin or other object)     Bigger than a half dollar  5. ONSET: When did the boil start?     2 days ago  6. PAIN: Is there any pain? If Yes, ask: How bad is the pain?   (Scale 1-10; or mild, moderate, severe)     Moderate  7. FEVER: Do you have a fever? If Yes, ask: What is it, how was it measured, and when did it start?      no 8. SOURCE: Have you been around anyone with boils or other Staph infections? Have you ever had boils before?     no 9. OTHER SYMPTOMS: Do you have any other symptoms? (e.g., shaking chills, weakness, rash elsewhere on body)     no  Protocols used: Boil (Skin Abscess)-A-AH

## 2023-08-17 ENCOUNTER — Telehealth: Payer: Self-pay

## 2023-08-17 ENCOUNTER — Encounter: Payer: Self-pay | Admitting: Family Medicine

## 2023-08-17 ENCOUNTER — Ambulatory Visit: Admitting: Family Medicine

## 2023-08-17 VITALS — BP 127/66 | HR 50 | Temp 98.0°F | Ht 71.0 in | Wt 196.0 lb

## 2023-08-17 DIAGNOSIS — N6081 Other benign mammary dysplasias of right breast: Secondary | ICD-10-CM | POA: Diagnosis not present

## 2023-08-17 DIAGNOSIS — L0291 Cutaneous abscess, unspecified: Secondary | ICD-10-CM

## 2023-08-17 MED ORDER — AMOXICILLIN-POT CLAVULANATE 875-125 MG PO TABS: 1.0000 | ORAL_TABLET | Freq: Two times a day (BID) | ORAL | 0 refills | Status: DC

## 2023-08-17 NOTE — Telephone Encounter (Signed)
 Copied from CRM [id number]. Topic: Clinical - Prescription Issue >> Aug 17, 2023 11:54 AM Travis F wrote: Reason for CRM: Patient's wife is calling in because patient was seen in office this morning and the provider was supposed to send in an antibiotic and nothing was sent in. She is requesting it be sent in as soon as possible. She is also requesting a call back once it has been sent.

## 2023-08-17 NOTE — Telephone Encounter (Signed)
 Please let the patient know that I sent their prescription to their pharmacy. Thanks, WS

## 2023-08-17 NOTE — Telephone Encounter (Signed)
 Pt's wife notified. LS

## 2023-08-17 NOTE — Progress Notes (Signed)
 Chief Complaint  Patient presents with   Recurrent Skin Infections    Boil on right peck. Noticed first on Saturday. Red and sore to the touch. No drainage. Warm compress to treat.     HPI  Patient presents today for lesion at right side of the chest on the upper pectoral region.  It has been red swollen and painful growing for about 4 days.  He has been using warm compresses on it.  However it seems to be getting larger.  PMH: Smoking status noted Review of Systems  Objective: BP 127/66   Pulse (!) 50   Temp 98 F (36.7 C)   Ht 5' 11 (1.803 m)   Wt 196 lb (88.9 kg)   SpO2 97%   BMI 27.34 kg/m  Gen: NAD, alert, cooperative with exam HEENT: NCAT, CV: RRR, good S1/S2, no murmur Resp: CTABL, no wheezes, non-labored Ext: No edema, warm  Skin: There is a 4 cm raised edematous lesion with surrounding erythema.  There is warmth and fluctuance about the lesion.  There is a nidus with slight oozing of purulent matter.  The pt was prepped with betadine  and draped in sterile fashion. Local anesthesia obtained using 4 cc 2% lidocaine  with epi. A 11 blade scalpel was used to lance  the lesion following the skin lines.  Q-tip was used to probe and break loculations.  Copious mucopurulent and bloody discharge was removed.  A Nu Gauze wick was placed.  After cleaning, the wound was dressed using neosporin and gauze.   Wound care reviewed. Signs and symptoms of infection also reviewed with pt / significant other. There are no diagnoses linked to this encounter.

## 2023-08-19 ENCOUNTER — Other Ambulatory Visit: Payer: Self-pay | Admitting: Family Medicine

## 2023-08-22 LAB — WOUND CULTURE

## 2023-08-23 ENCOUNTER — Ambulatory Visit: Payer: Self-pay | Admitting: Family Medicine

## 2023-08-24 ENCOUNTER — Encounter: Payer: Self-pay | Admitting: Family Medicine

## 2023-09-28 ENCOUNTER — Ambulatory Visit (INDEPENDENT_AMBULATORY_CARE_PROVIDER_SITE_OTHER): Payer: Medicare Other

## 2023-09-28 DIAGNOSIS — R55 Syncope and collapse: Secondary | ICD-10-CM | POA: Diagnosis not present

## 2023-09-30 LAB — CUP PACEART REMOTE DEVICE CHECK
Battery Remaining Longevity: 114 mo
Battery Remaining Percentage: 100 %
Brady Statistic RV Percent Paced: 56 %
Date Time Interrogation Session: 20250813012100
Implantable Lead Connection Status: 753985
Implantable Lead Implant Date: 20240808
Implantable Lead Location: 753860
Implantable Lead Model: 7842
Implantable Lead Serial Number: 1314359
Implantable Pulse Generator Implant Date: 20240808
Lead Channel Impedance Value: 612 Ohm
Lead Channel Setting Pacing Amplitude: 2 V
Lead Channel Setting Pacing Pulse Width: 0.4 ms
Lead Channel Setting Sensing Sensitivity: 3.5 mV
Pulse Gen Serial Number: 907855
Zone Setting Status: 755011

## 2023-10-09 ENCOUNTER — Ambulatory Visit: Payer: Self-pay | Admitting: Cardiovascular Disease

## 2023-10-16 ENCOUNTER — Other Ambulatory Visit: Payer: Self-pay | Admitting: Cardiovascular Disease

## 2023-10-18 NOTE — Telephone Encounter (Signed)
 Pt last saw Dr Burnard 04/25/23, last labs 08/01/23 Creat 0.86, age 88, weight 88.9kg, based on specified criteria pt is on appropriate dosage of Eliquis  5mg  BID for afib.  Will refill rx.

## 2023-10-20 ENCOUNTER — Encounter: Payer: Self-pay | Admitting: Cardiology

## 2023-11-02 ENCOUNTER — Ambulatory Visit: Admitting: Family Medicine

## 2023-11-02 ENCOUNTER — Encounter: Payer: Self-pay | Admitting: Family Medicine

## 2023-11-02 VITALS — BP 135/67 | HR 60 | Temp 97.8°F | Ht 71.0 in | Wt 197.0 lb

## 2023-11-02 DIAGNOSIS — E785 Hyperlipidemia, unspecified: Secondary | ICD-10-CM

## 2023-11-02 DIAGNOSIS — E1140 Type 2 diabetes mellitus with diabetic neuropathy, unspecified: Secondary | ICD-10-CM

## 2023-11-02 DIAGNOSIS — Z125 Encounter for screening for malignant neoplasm of prostate: Secondary | ICD-10-CM | POA: Diagnosis not present

## 2023-11-02 DIAGNOSIS — I10 Essential (primary) hypertension: Secondary | ICD-10-CM | POA: Diagnosis not present

## 2023-11-02 DIAGNOSIS — M546 Pain in thoracic spine: Secondary | ICD-10-CM

## 2023-11-02 LAB — BAYER DCA HB A1C WAIVED: HB A1C (BAYER DCA - WAIVED): 5.6 % (ref 4.8–5.6)

## 2023-11-02 LAB — LIPID PANEL

## 2023-11-03 LAB — CMP14+EGFR
ALT: 13 IU/L (ref 0–44)
AST: 15 IU/L (ref 0–40)
Albumin: 4.4 g/dL (ref 3.7–4.7)
Alkaline Phosphatase: 78 IU/L (ref 48–129)
BUN/Creatinine Ratio: 17 (ref 10–24)
BUN: 13 mg/dL (ref 8–27)
Bilirubin Total: 1.1 mg/dL (ref 0.0–1.2)
CO2: 25 mmol/L (ref 20–29)
Calcium: 8.9 mg/dL (ref 8.6–10.2)
Chloride: 104 mmol/L (ref 96–106)
Creatinine, Ser: 0.78 mg/dL (ref 0.76–1.27)
Globulin, Total: 2.8 g/dL (ref 1.5–4.5)
Glucose: 96 mg/dL (ref 70–99)
Potassium: 4.4 mmol/L (ref 3.5–5.2)
Sodium: 144 mmol/L (ref 134–144)
Total Protein: 7.2 g/dL (ref 6.0–8.5)
eGFR: 87 mL/min/1.73 (ref >59)

## 2023-11-03 LAB — CBC WITH DIFFERENTIAL/PLATELET
Basophils Absolute: 0 x10E3/uL (ref 0.0–0.2)
Basos: 0 %
EOS (ABSOLUTE): 0.1 x10E3/uL (ref 0.0–0.4)
Eos: 2 %
Hematocrit: 42.5 % (ref 37.5–51.0)
Hemoglobin: 14 g/dL (ref 13.0–17.7)
Immature Grans (Abs): 0 x10E3/uL (ref 0.0–0.1)
Immature Granulocytes: 0 %
Lymphocytes Absolute: 1.2 x10E3/uL (ref 0.7–3.1)
Lymphs: 22 %
MCH: 32.2 pg (ref 26.6–33.0)
MCHC: 32.9 g/dL (ref 31.5–35.7)
MCV: 98 fL — ABNORMAL HIGH (ref 79–97)
Monocytes Absolute: 0.5 x10E3/uL (ref 0.1–0.9)
Monocytes: 9 %
Neutrophils Absolute: 3.4 x10E3/uL (ref 1.4–7.0)
Neutrophils: 67 %
Platelets: 209 x10E3/uL (ref 150–450)
RBC: 4.35 x10E6/uL (ref 4.14–5.80)
RDW: 12.7 % (ref 11.6–15.4)
WBC: 5.2 x10E3/uL (ref 3.4–10.8)

## 2023-11-03 LAB — LIPID PANEL
Cholesterol, Total: 104 mg/dL (ref 100–199)
HDL: 52 mg/dL (ref >39)
LDL CALC COMMENT:: 2 ratio (ref 0.0–5.0)
LDL Chol Calc (NIH): 38 mg/dL (ref 0–99)
Triglycerides: 64 mg/dL (ref 0–149)
VLDL Cholesterol Cal: 14 mg/dL (ref 5–40)

## 2023-11-03 LAB — PSA, TOTAL AND FREE
PSA, Free Pct: 33 %
PSA, Free: 0.33 ng/mL
Prostate Specific Ag, Serum: 1 ng/mL (ref 0.0–4.0)

## 2023-11-03 LAB — VITAMIN B12: Vitamin B-12: 1177 pg/mL (ref 232–1245)

## 2023-11-08 ENCOUNTER — Ambulatory Visit: Payer: Self-pay | Admitting: Family Medicine

## 2023-11-08 ENCOUNTER — Encounter: Payer: Self-pay | Admitting: Family Medicine

## 2023-11-08 NOTE — Progress Notes (Signed)
Hello [name],  Your lab result is normal and/or stable.Some minor variations that are not significant are commonly marked abnormal, but do not represent any medical problem for you.  Best regards, Helon Wisinski, M.D.

## 2023-11-08 NOTE — Progress Notes (Signed)
 Subjective:  Patient ID: [name], male    DOB: [birth year]  Age: 88 y.o. MRN: [id number]  CC: Medical Management of Chronic Issues   HPI  Discussed the use of AI scribe software for clinical note transcription with the patient, who gave verbal consent to proceed.  History of Present Illness [name] is an 88 year old male with diabetes who presents for routine follow-up.  His blood sugar levels have been stable, with morning readings typically around 119 mg/dL and nighttime readings ranging from 119 to 150 mg/dL. He continues to take metformin  for diabetes management.  He is currently on Eliquis  as a blood thinner and has been taking Rapaflo  (silodosin ) for prostate issues, which has reduced his nocturia from two to three times per night to twice last night.  He uses Kenalog (triamcinolone) and clobetasol  (Temovate ) for skin issues on his hands, which he applies persistently to soften the skin. He does not currently have any psoriasis outbreaks.  He experiences back pain, particularly in the area between his shoulder blades, which started after mowing the lawn. He reports back pain, particularly in the area between his shoulder blades, which started after mowing the lawn.  He has been taking a medication for heartburn for many years, initially prescribed by a previous doctor, and reports no current issues with heartburn.  His bowel movements are still somewhat loose, and he plans to discuss this with his gastroenterologist during a yearly visit in the spring.  He participates in physical activities such as Silver Sneakers and playing goblin ball twice a week.          11/02/2023   10:28 AM 08/01/2023    9:52 AM 02/22/2023    7:56 PM  Depression screen PHQ 2/9  Decreased Interest 0 0 0  Down, Depressed, Hopeless 0 0 0  PHQ - 2 Score 0 0 0  Altered sleeping  0   Tired, decreased energy  0   Change in appetite  0   Feeling bad or failure about yourself   0    Trouble concentrating  0   Moving slowly or fidgety/restless  0   Suicidal thoughts  0   PHQ-9 Score  0   Difficult doing work/chores  Not difficult at all     History [name] has a past medical history of Arthritis, Atrial fibrillation (HCC), BPH (benign prostatic hyperplasia), Cataracts, bilateral, Coronary artery disease excluded, Diabetes mellitus without complication (HCC), Diverticulosis, Endocarditis, valve unspecified, unspecified cause, GERD (gastroesophageal reflux disease), H/O mitral valve repair, Hearing loss, Heart murmur, Hiatal hernia (06/13/2002), High cholesterol, History of kidney stones, OSA on CPAP, Sleep apnea, Stroke (HCC), Unspecified essential hypertension, Weakness of both arms (07/19/2012), and Wears dentures.   He has a past surgical history that includes Vasectomy; Mitral valve repair (~ 2003); Cataract extraction w/PHACO (Right, 05/12/2015); Cataract extraction w/PHACO (Left, 06/12/2015); Total knee arthroplasty (Left, 02/20/2016); Injection knee (Right, 02/20/2016); Cardioversion (N/A, 04/14/2016); Laparoscopic cholecystectomy; Knee arthroscopy (Left); Joint replacement; Shoulder open rotator cuff repair (Left); Colonoscopy; Upper gi endoscopy; Multiple tooth extractions; Cardiac catheterization (02/14/2007); Shoulder arthroscopy with rotator cuff repair and open biceps tenodesis (Right, 01/26/2019); Eye surgery; Total knee arthroplasty (Right, 07/06/2019); Esophagogastroduodenoscopy (N/A, 05/21/2021); ERCP (N/A, 05/21/2021); maloney dilation (N/A, 05/21/2021); sphincterotomy (05/21/2021); removal of stones (05/21/2021); Esophageal dilation (N/A, 05/21/2021); ERCP (N/A, 05/21/2021); Colonoscopy with propofol  (N/A, 01/21/2022); Esophagogastroduodenoscopy (egd) with propofol  (N/A, 01/21/2022); Esophageal dilation (01/21/2022); biopsy (01/21/2022); and PACEMAKER IMPLANT (N/A, 09/23/2022).   His family history includes Congestive Heart Failure in his  father; Diabetes Mellitus II in his brother and  sister; Heart disease in his father; Lung cancer in his brother; Stroke in his brother, brother, sister, and sister.He reports that he quit smoking about 35 years ago. His smoking use included cigarettes. He started smoking about 70 years ago. He has a 105 pack-year smoking history. He has been exposed to tobacco smoke. He has never used smokeless tobacco. He reports that he does not drink alcohol and does not use drugs.    ROS Review of Systems  Constitutional:  Negative for fever.  Respiratory:  Negative for shortness of breath.   Cardiovascular:  Negative for chest pain.  Musculoskeletal:  Negative for arthralgias.  Skin:  Negative for rash.    Objective:  BP 135/67   Pulse 60   Temp 97.8 F (36.6 C)   Ht 5' 11 (1.803 m)   Wt 197 lb (89.4 kg)   SpO2 98%   BMI 27.48 kg/m   BP Readings from Last 3 Encounters:  11/02/23 135/67  08/17/23 127/66  08/01/23 (!) 121/59    Wt Readings from Last 3 Encounters:  11/02/23 197 lb (89.4 kg)  08/17/23 196 lb (88.9 kg)  08/01/23 196 lb (88.9 kg)     Physical Exam Vitals reviewed.  Constitutional:      Appearance: He is well-developed.  HENT:     Head: Normocephalic and atraumatic.     Right Ear: External ear normal.     Left Ear: External ear normal.     Mouth/Throat:     Pharynx: No oropharyngeal exudate or posterior oropharyngeal erythema.  Eyes:     Pupils: Pupils are equal, round, and reactive to light.  Cardiovascular:     Rate and Rhythm: Normal rate and regular rhythm.     Heart sounds: No murmur heard. Pulmonary:     Effort: No respiratory distress.     Breath sounds: Normal breath sounds.  Musculoskeletal:     Cervical back: Normal range of motion and neck supple.  Neurological:     Mental Status: He is alert and oriented to person, place, and time.      Assessment & Plan:  Type 2 diabetes mellitus with diabetic neuropathy, unspecified whether long term insulin  use (HCC) -     Bayer DCA Hb A1c Waived -      CMP14+EGFR -     Vitamin B12  Dyslipidemia -     Lipid panel  Essential hypertension -     CBC with Differential/Platelet  Prostate cancer screening -     PSA, total and free    Assessment and Plan Assessment & Plan Acute back muscle strain   He experienced an acute back muscle strain after mowing the lawn, with pain centered around the shoulder blade area. The strain is likely due to overuse and should improve with conservative management. Apply heat to the affected area, rest, and avoid strenuous activities. Use acetaminophen  for pain management.  Recurrent skin infections   Recurrent skin infections are managed with topical treatments. He uses triamcinolone (Kenalog) and clobetasol  (Temovate ) for skin issues on the hands. The skin softens with treatment, but persistence is required. He does not currently have psoriasis outbreaks. Continue using triamcinolone and clobetasol  twice daily for a couple of weeks. Consult a dermatologist if further treatment is needed.  Chronic diarrhea   Chronic diarrhea is managed with medication. Bowel movements remain loose, and he plans to follow up with the prescribing doctor in the spring. Continue current management and  follow up with the prescribing doctor in the spring.  Chronic heartburn   Concerns about potential side effects such as kidney issues and cancer were discussed, but these are rare and not supported by current evidence. Regular kidney function tests are performed. Continue current heartburn medication. Consider a trial off medication if desired, with monitoring for symptom recurrence.  Type 2 diabetes mellitus   Type 2 diabetes mellitus is well-controlled with an A1c of 5.6%. Blood glucose levels are monitored regularly, with readings between 119 and 150 mg/dL, primarily taken at night. Continue metformin  as prescribed and regular blood glucose monitoring.  Benign prostatic hyperplasia with lower urinary tract symptoms   Symptoms  of benign prostatic hyperplasia have improved with the increased dose of silodosin  (Rapaflo ). Nocturia has decreased from two to three times per night to twice per night. Continue silodosin  as prescribed.       Follow-up: No follow-ups on file.  Butler Der, M.D.

## 2023-11-10 NOTE — Progress Notes (Signed)
 Remote PPM Transmission

## 2023-11-14 DIAGNOSIS — R310 Gross hematuria: Secondary | ICD-10-CM | POA: Diagnosis not present

## 2023-11-14 DIAGNOSIS — R3914 Feeling of incomplete bladder emptying: Secondary | ICD-10-CM | POA: Diagnosis not present

## 2023-11-17 NOTE — Progress Notes (Deleted)
  Cardiology Office Note:   Date:  11/17/2023  ID:  [name], DOB 1935/04/11, MRN [id number] PCP: Zollie Lowers, MD  [facility] Providers Cardiologist:  Lynwood Schilling, MD {  History of Present Illness:   [name] is a 88 y.o. male  for evaluation of mitral valve repair.  Echo 10/2015 demonstrated stable MV repair.  While in the hospital in 2017 for knee surgery he developed persistent atrial fib.  He had cardioversion but went back into atrial fib.  He wore a Holter monitor which demonstrated frequent 3.5 second pauses at night. His average heart rate was about 80.   He is treated for sleep apnea.  In 2019 he had some double vision and was told he could have had a small stroke.  His INR was therapeutic. Follow up echo was normal.  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  He was also seen by neurology.     He return for follow up.  ***   ***He has lost about 30 pounds or more with weight watchers! The patient denies any new symptoms such as chest discomfort, neck or arm discomfort. There has been no new shortness of breath, PND or orthopnea. There have been no reported palpitations, presyncope or syncope.  He has had some stomach issues but mostly he is now bothered by psoriasis.  He is about to start methotrexate.    He had 1 episode where the coffee cup ended up on the floor across the room from him and he had no idea why.  He does not think he lost consciousness.  He has not had any other episodes of this.    ROS: ***  Studies Reviewed:    EKG:       ***  Risk Assessment/Calculations:   {Does this patient have ATRIAL FIBRILLATION?:[phone]} No BP recorded.  {Refresh Note OR Click here to enter BP  :1}***        Physical Exam:   VS:  There were no vitals taken for this visit.   Wt Readings from Last 3 Encounters:  11/02/23 197 lb (89.4 kg)  08/17/23 196 lb (88.9 kg)  08/01/23 196 lb (88.9 kg)      GEN: Well nourished, well developed in no acute distress NECK: No JVD; No carotid bruits CARDIAC: ***RR, *** murmurs, rubs, gallops RESPIRATORY:  Clear to auscultation without rales, wheezing or rhonchi  ABDOMEN: Soft, non-tender, non-distended EXTREMITIES:  No edema; No deformity   ASSESSMENT AND PLAN:   ATRIAL FIB:    *** [name] has a CHA2DS2 - VASc of 5.   He tolerates anticoagulation.  He is having his blood work followed closely.  No change in therapy.   SLEEP APNEA:   ***   He wears a CPAP.   MITRAL VALVE REPAIR:    He had stable repair on echo in July 2024.  ***  This was stable last year.  No change in therapy.  I do not think he needs further imaging this year.  He understands endocarditis prophylaxis.   HTN:  The blood pressure is ***  at target.  No change in therapy.   OBESITY:   I am very proud of his weight loss.       Follow up ***  Signed, Lynwood Schilling, MD

## 2023-11-18 ENCOUNTER — Emergency Department (HOSPITAL_COMMUNITY)
Admission: EM | Admit: 2023-11-18 | Discharge: 2023-11-18 | Disposition: A | Discharge status: Home / Self Care | Attending: Emergency Medicine | Admitting: Emergency Medicine

## 2023-11-18 ENCOUNTER — Other Ambulatory Visit: Payer: Self-pay

## 2023-11-18 ENCOUNTER — Emergency Department (HOSPITAL_COMMUNITY)

## 2023-11-18 ENCOUNTER — Ambulatory Visit: Admitting: Cardiology

## 2023-11-18 DIAGNOSIS — I482 Chronic atrial fibrillation, unspecified: Secondary | ICD-10-CM

## 2023-11-18 DIAGNOSIS — Z7901 Long term (current) use of anticoagulants: Secondary | ICD-10-CM | POA: Diagnosis not present

## 2023-11-18 DIAGNOSIS — I4891 Unspecified atrial fibrillation: Secondary | ICD-10-CM | POA: Diagnosis not present

## 2023-11-18 DIAGNOSIS — N281 Cyst of kidney, acquired: Secondary | ICD-10-CM | POA: Diagnosis not present

## 2023-11-18 DIAGNOSIS — Z9889 Other specified postprocedural states: Secondary | ICD-10-CM

## 2023-11-18 DIAGNOSIS — R310 Gross hematuria: Secondary | ICD-10-CM | POA: Diagnosis not present

## 2023-11-18 DIAGNOSIS — I10 Essential (primary) hypertension: Secondary | ICD-10-CM

## 2023-11-18 DIAGNOSIS — R319 Hematuria, unspecified: Secondary | ICD-10-CM | POA: Diagnosis present

## 2023-11-18 LAB — CBC WITH DIFFERENTIAL/PLATELET
Abs Immature Granulocytes: 0.04 K/uL (ref 0.00–0.07)
Basophils Absolute: 0 K/uL (ref 0.0–0.1)
Basophils Relative: 0 %
Eosinophils Absolute: 0.1 K/uL (ref 0.0–0.5)
Eosinophils Relative: 1 %
HCT: 39.2 % (ref 39.0–52.0)
Hemoglobin: 13 g/dL (ref 13.0–17.0)
Immature Granulocytes: 1 %
Lymphocytes Relative: 9 %
Lymphs Abs: 0.7 K/uL (ref 0.7–4.0)
MCH: 32.6 pg (ref 26.0–34.0)
MCHC: 33.2 g/dL (ref 30.0–36.0)
MCV: 98.2 fL (ref 80.0–100.0)
Monocytes Absolute: 0.6 K/uL (ref 0.1–1.0)
Monocytes Relative: 8 %
Neutro Abs: 6.3 K/uL (ref 1.7–7.7)
Neutrophils Relative %: 81 %
Platelets: 198 K/uL (ref 150–400)
RBC: 3.99 MIL/uL — ABNORMAL LOW (ref 4.22–5.81)
RDW: 13.2 % (ref 11.5–15.5)
WBC: 7.7 K/uL (ref 4.0–10.5)
nRBC: 0 % (ref 0.0–0.2)

## 2023-11-18 LAB — URINALYSIS, W/ REFLEX TO CULTURE (INFECTION SUSPECTED)
RBC / HPF: >50 RBC/hpf (ref 0–5)
Squamous Epithelial / HPF: NONE SEEN /HPF (ref 0–5)

## 2023-11-18 LAB — BASIC METABOLIC PANEL WITH GFR
Anion gap: 11 (ref 5–15)
BUN: 16 mg/dL (ref 8–23)
CO2: 26 mmol/L (ref 22–32)
Calcium: 8.9 mg/dL (ref 8.9–10.3)
Chloride: 104 mmol/L (ref 98–111)
Creatinine, Ser: 0.88 mg/dL (ref 0.61–1.24)
GFR, Estimated: >60 mL/min (ref >60)
Glucose, Bld: 136 mg/dL — ABNORMAL HIGH (ref 70–99)
Potassium: 3.8 mmol/L (ref 3.5–5.1)
Sodium: 141 mmol/L (ref 135–145)

## 2023-11-18 NOTE — ED Provider Notes (Signed)
 [place] [facility] AT [facility] Provider Note   CSN: [id number] Arrival date & time: 11/18/23  9184     Patient presents with: Hematuria   [name] is a 88 y.o. male.  He has brought in by ambulance with continued hematuria and difficulty passing urine going on for 4 days.  He had talked to his urologist who stopped his Eliquis  for a few days.  He restarted the Eliquis  2 days ago.  He is on Eliquis  for atrial fibrillation.  EMS gave Toradol  and Zofran  with improvement in his symptoms.  At 4 AM today he had difficulty passing urine.  Associated with some left flank pain.  No fevers chills nausea vomiting.  Follows with [facility].   The history is provided by the patient.  Hematuria This is a recurrent problem. The current episode started more than 2 days ago. The problem occurs hourly. The problem has not changed since onset.Associated symptoms include abdominal pain. Pertinent negatives include no chest pain, no headaches and no shortness of breath. Nothing aggravates the symptoms. Nothing relieves the symptoms. He has tried rest for the symptoms. The treatment provided no relief.       Prior to Admission medications   Medication Sig Start Date End Date Taking? Authorizing Provider  ACCU-CHEK GUIDE TEST test strip CHECK SUGAR IN THE MORNING, AT NOON, AND AT BEDTIME 08/22/23   Zollie Lowers, MD  Accu-Chek Softclix Lancets lancets Test BS in the morning, at noon and at bedtime Dx E11.40 11/01/22   Zollie Lowers, MD  amoxicillin -clavulanate (AUGMENTIN ) 875-125 MG tablet Take 1 tablet by mouth 2 (two) times daily. Take all of this medication 08/17/23   Zollie Lowers, MD  apixaban  (ELIQUIS ) 5 MG TABS tablet TAKE 1 TABLET BY MOUTH TWICE A DAY 10/18/23   Croitoru, Mihai, MD  Bacillus Coagulans-Inulin (ALIGN PREBIOTIC-PROBIOTIC PO) Take 1 capsule by mouth daily.    [provider]  Blood Glucose Monitoring Suppl DEVI 1 each by Does not apply route in  the morning, at noon, and at bedtime. May substitute to any manufacturer covered by patient's insurance. 07/22/22   Zollie Lowers, MD  Calcium  Carb-Cholecalciferol (CALCIUM  600 + D PO) Take 1 tablet by mouth daily.    [provider]  clobetasol  cream (TEMOVATE ) 0.05 % Apply 1 Application topically 2 (two) times daily as needed (Psoriasis). 04/27/23   Zollie Lowers, MD  Coenzyme Q10 300 MG CAPS Take 300 mg by mouth daily.    [provider]  colestipol  (COLESTID ) 1 g tablet TAKE 4 TABLETS (4 G TOTAL) BY MOUTH DAILY. TAKE IT 4 HOURS APART FROM HIS OTHER MEDICATIONS 08/08/23   Mariette Cree L, NP  finasteride (PROSCAR) 5 MG tablet Take 5 mg by mouth daily. 06/03/22   [provider]  fluticasone (CUTIVATE) 0.05 % cream Apply 1 Application topically 2 (two) times daily as needed (Psoriasis). 10/22/21   [provider]  folic acid (FOLVITE) 1 MG tablet Take 1 mg by mouth daily. 12/10/21   [provider]  metFORMIN  (GLUCOPHAGE -XR) 500 MG 24 hr tablet TAKE 1 TABLET BY MOUTH EVERY DAY WITH BREAKFAST 04/27/23   Zollie Lowers, MD  Multiple Vitamin (MULTIVITAMIN WITH MINERALS) TABS tablet Take 1 tablet by mouth daily.    [provider]  omeprazole  (PRILOSEC) 40 MG capsule TAKE 1 CAPSULE (40 MG TOTAL) BY MOUTH DAILY. 07/05/23   Eartha Angelia Sieving, MD  rosuvastatin  (CRESTOR ) 5 MG tablet Take 1 tablet (5 mg total) by mouth  daily. For cholesterol 04/27/23   Zollie Lowers, MD  silodosin  (RAPAFLO ) 8 MG CAPS capsule Take 8 mg by mouth daily with breakfast.    [provider]  triamcinolone cream (KENALOG) 0.1 % Apply 1 Application topically daily as needed (Psoriasis). 12/30/21   [provider]    Allergies: Flomax [tamsulosin hcl]    Review of Systems  Constitutional:  Negative for fever.  HENT:  Negative for sore throat.   Respiratory:  Negative for shortness of breath.   Cardiovascular:  Negative for chest pain.  Gastrointestinal:   Positive for abdominal pain. Negative for nausea and vomiting.  Genitourinary:  Positive for difficulty urinating, flank pain and hematuria. Negative for dysuria.  Musculoskeletal:  Positive for back pain.  Neurological:  Negative for headaches.    Updated Vital Signs BP (!) 153/81   Pulse (!) 49   Temp 97.8 F (36.6 C) (Oral)   Resp 16   Ht 5' 11 (1.803 m)   Wt 89.4 kg   SpO2 98%   BMI 27.48 kg/m   Physical Exam Vitals and nursing note reviewed.  Constitutional:      General: He is not in acute distress.    Appearance: Normal appearance. He is well-developed.  HENT:     Head: Normocephalic and atraumatic.  Eyes:     Conjunctiva/sclera: Conjunctivae normal.  Cardiovascular:     Rate and Rhythm: Normal rate and regular rhythm.     Heart sounds: No murmur heard. Pulmonary:     Effort: Pulmonary effort is normal. No respiratory distress.     Breath sounds: Normal breath sounds.  Abdominal:     Palpations: Abdomen is soft.     Tenderness: There is no abdominal tenderness. There is no guarding or rebound.  Musculoskeletal:        General: No deformity.     Cervical back: Neck supple.  Skin:    General: Skin is warm and dry.     Capillary Refill: Capillary refill takes less than 2 seconds.  Neurological:     General: No focal deficit present.     Mental Status: He is alert.     (all labs ordered are listed, but only abnormal results are displayed) Labs Reviewed  BASIC METABOLIC PANEL WITH GFR - Abnormal; Notable for the following components:      Result Value   Glucose, Bld 136 (*)    All other components within normal limits  CBC WITH DIFFERENTIAL/PLATELET - Abnormal; Notable for the following components:   RBC 3.99 (*)    All other components within normal limits  URINALYSIS, W/ REFLEX TO CULTURE (INFECTION SUSPECTED) - Abnormal; Notable for the following components:   Color, Urine RED (*)    APPearance TURBID (*)    Glucose, UA   (*)    Value: TEST NOT  REPORTED DUE TO COLOR INTERFERENCE OF URINE PIGMENT   Hgb urine dipstick   (*)    Value: TEST NOT REPORTED DUE TO COLOR INTERFERENCE OF URINE PIGMENT   Bilirubin Urine   (*)    Value: TEST NOT REPORTED DUE TO COLOR INTERFERENCE OF URINE PIGMENT   Ketones, ur   (*)    Value: TEST NOT REPORTED DUE TO COLOR INTERFERENCE OF URINE PIGMENT   Protein, ur   (*)    Value: TEST NOT REPORTED DUE TO COLOR INTERFERENCE OF URINE PIGMENT   Nitrite   (*)    Value: TEST NOT REPORTED DUE TO COLOR INTERFERENCE OF URINE PIGMENT  Leukocytes,Ua   (*)    Value: TEST NOT REPORTED DUE TO COLOR INTERFERENCE OF URINE PIGMENT   Bacteria, UA RARE (*)    All other components within normal limits    EKG: None  Radiology: CT Renal Stone Study Result Date: 11/18/2023 CLINICAL DATA:  Flank pain. EXAM: CT ABDOMEN AND PELVIS WITHOUT CONTRAST TECHNIQUE: Multidetector CT imaging of the abdomen and pelvis was performed following the standard protocol without IV contrast. RADIATION DOSE REDUCTION: This exam was performed according to the departmental dose-optimization program which includes automated exposure control, adjustment of the mA and/or kV according to patient size and/or use of iterative reconstruction technique. COMPARISON:  October 02, 2020.  May 20, 2021. FINDINGS: Lower chest: No acute abnormality. Hepatobiliary: Status post cholecystectomy. Pneumobilia is noted. No biliary dilatation is noted. Stable right hepatic low densities are noted most consistent with hemangiomas. Pancreas: Unremarkable. No pancreatic ductal dilatation or surrounding inflammatory changes. Spleen: Normal in size without focal abnormality. Adrenals/Urinary Tract: Adrenal glands appear normal. Stable bilateral renal cysts are noted. No hydronephrosis or renal obstruction is noted. Urinary bladder is unremarkable. Stomach/Bowel: Stomach is within normal limits. Appendix appears normal. No evidence of bowel wall thickening, distention, or  inflammatory changes. Vascular/Lymphatic: Aortic atherosclerosis. No enlarged abdominal or pelvic lymph nodes. Reproductive: Mild prostatic enlargement is noted. Other: No abdominal wall hernia or abnormality. No abdominopelvic ascites. Musculoskeletal: No acute or significant osseous findings. IMPRESSION: 1. No acute abnormality seen in the abdomen or pelvis. 2. Stable hepatic low densities are noted most consistent with hemangiomas. 3. Stable bilateral renal cysts are noted. 4. Mild prostatic enlargement. 5. Aortic atherosclerosis. Aortic Atherosclerosis (ICD10-I70.0). Electronically Signed   By: Lynwood Landy Raddle M.D.   On: 11/18/2023 09:33     Procedures   Medications Ordered in the ED - No data to display  Clinical Course as of 11/18/23 1633  Fri Nov 18, 2023  1020 Patient's postvoid residual was 0 cc.  His hemoglobin is stable and his renal function is normal.  Urinalysis does not show any clear sign of infection.  Will have him hold his Eliquis  and continue to hydrate.  Close follow-up with urology.  Reviewed instructions with patient and his wife and son. [MB]    Clinical Course User Index [MB] Towana Ozell BROCKS, MD                                 Medical Decision Making Amount and/or Complexity of Data Reviewed Labs: ordered. Radiology: ordered.   This patient complains of bloody urine, difficulty passing urine; this involves an extensive number of treatment Options and is a complaint that carries with it a high risk of complications and morbidity. The differential includes hematuria, infection, obstruction, retention, kidney stone  I ordered, reviewed and interpreted labs, which included CBC with normal white count normal hemoglobin, chemistries with normal renal function, urinalysis with gross hematuria no signs of infection I ordered imaging studies which included CT renal and I independently    visualized and interpreted imaging which showed no acute findings Additional  history obtained from patient's wife and son Previous records obtained and reviewed in epic including recent urology notes Cardiac monitoring reviewed, atrial fibrillation Social determinants considered, no significant barriers Critical Interventions: None  After the interventions stated above, I reevaluated the patient and found patient was able to void completely without any difficulty no evidence of retention Admission and further testing considered, no indications for  admission at this time.  Will have patient hold his blood thinner and continue to hydrate.  Monitor for signs of obstruction.  Recommend close follow-up with urology.  Return instructions discussed      Final diagnoses:  Gross hematuria    ED Discharge Orders     None          Towana Ozell BROCKS, MD 11/18/23 1635

## 2023-11-18 NOTE — Discharge Instructions (Signed)
 Please hold your Eliquis  until Monday.  Keep well-hydrated.  Follow-up with urology.  Return to the emergency department if worsening bleeding or unable to pass urine.

## 2023-11-18 NOTE — ED Triage Notes (Signed)
 Pt arrived [facility] for c/o blood in his urine since Monday. Pt also c/o frequency and low output of urine. Pt saw urologist on Monday and recommended a CT. [facility] put 20 G in LAC and gave 30mg  of toradol  and 4 mg of zofran . Pt takes eliquis  bid and urologist stopped it for 2 days.

## 2023-11-30 ENCOUNTER — Encounter (INDEPENDENT_AMBULATORY_CARE_PROVIDER_SITE_OTHER): Payer: Self-pay | Admitting: Gastroenterology

## 2023-12-01 NOTE — Progress Notes (Signed)
 Cardiology Office Note:   Date:  12/02/2023  ID:  [name], DOB [birth year], MRN [id number] PCP: Zollie Lowers, MD  [facility] Providers Cardiologist:  Lynwood Schilling, MD {  History of Present Illness:   [name] is a 88 y.o. male  for evaluation of mitral valve repair.  Echo 10/2015 demonstrated stable MV repair.  While in the hospital in 2017 for knee surgery he developed persistent atrial fib.  He had cardioversion but went back into atrial fib.  He wore a Holter monitor which demonstrated frequent 3.5 second pauses at night. His average heart rate was about 80.   He is treated for sleep apnea.  In 2019 he had some double vision and was told he could have had a small stroke.  His INR was therapeutic. Follow up echo was normal.  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  He was also seen by neurology.     He return for follow up.  Since I last saw him he has done well.  He is taking care of his wife who is having back pain.  The patient denies any new symptoms such as chest discomfort, neck or arm discomfort. There has been no new shortness of breath, PND or orthopnea. There have been no reported palpitations, presyncope or syncope.     Of note I did review some ER notes.  He had some hematuria and did have to have his Eliquis  held for couple of days but this seems to have resolved.  He is been back on it for about a week and has urology workup pending.  He has not been having any further hematuria.  ROS: As stated in the HPI and negative for all other systems.  Studies Reviewed:    EKG:     Atrial fibrillation, rate 54, axis within normal limits, intervals within normal limits, no acute ST-T wave changes.  Risk Assessment/Calculations:    CHA2DS2-VASc Score = 6   This indicates a 9.7% annual risk of stroke. The patient's score is based upon: CHF History: 0 HTN History: 1 Diabetes History: 0 Stroke  History: 2 Vascular Disease History: 1 Age Score: 2 Gender Score: 0     Physical Exam:   VS:  BP (!) 167/72 (BP Location: Left Arm, Patient Position: Sitting, Cuff Size: Normal)   Pulse (!) 58   Wt 201 lb 9.6 oz (91.4 kg)   SpO2 99%   BMI 28.12 kg/m    Wt Readings from Last 3 Encounters:  12/02/23 201 lb 9.6 oz (91.4 kg)  11/18/23 197 lb (89.4 kg)  11/02/23 197 lb (89.4 kg)     GEN: Well nourished, well developed in no acute distress NECK: No JVD; No carotid bruits CARDIAC: Irregular RR, no murmurs, rubs, gallops RESPIRATORY:  Clear to auscultation without rales, wheezing or rhonchi  ABDOMEN: Soft, non-tender, non-distended EXTREMITIES:  No edema; No deformity   ASSESSMENT AND PLAN:   ATRIAL FIB:    The patient tolerates anticoagulation.  He is a good rate control.  No change in therapy.   SLEEP APNEA:   He tolerates CPAP.  No change in therapy.  MITRAL VALVE REPAIR:    He had stable repair on echo in July 2024.  No further imaging is indicated.   HTN:  The blood pressure is at target.  No change in therapy.   OBESITY:   I am very proud of his weight loss.  He has maintained  this weight loss.       Follow up with me in one year.   Signed, Lynwood Schilling, MD

## 2023-12-02 ENCOUNTER — Ambulatory Visit: Attending: Cardiology | Admitting: Cardiology

## 2023-12-02 ENCOUNTER — Encounter: Payer: Self-pay | Admitting: Cardiology

## 2023-12-02 VITALS — BP 167/72 | HR 58 | Wt 201.6 lb

## 2023-12-02 DIAGNOSIS — I10 Essential (primary) hypertension: Secondary | ICD-10-CM

## 2023-12-02 DIAGNOSIS — Z9889 Other specified postprocedural states: Secondary | ICD-10-CM

## 2023-12-02 DIAGNOSIS — I4891 Unspecified atrial fibrillation: Secondary | ICD-10-CM | POA: Diagnosis not present

## 2023-12-02 NOTE — Patient Instructions (Signed)
 Medication Instructions:  Your physician recommends that you continue on your current medications as directed. Please refer to the Current Medication list given to you today.  *If you need a refill on your cardiac medications before your next appointment, please call your pharmacy*  Lab Work: NONE If you have labs (blood work) drawn today and your tests are completely normal, you will receive your results only by: MyChart Message (if you have MyChart) OR A paper copy in the mail If you have any lab test that is abnormal or we need to change your treatment, we will call you to review the results.  Testing/Procedures: NONE  Follow-Up: At [facility], you and your health needs are our priority.  As part of our continuing mission to provide you with exceptional heart care, our providers are all part of one team.  This team includes your primary Cardiologist (physician) and Advanced Practice Providers or APPs (Physician Assistants and Nurse Practitioners) who all work together to provide you with the care you need, when you need it.  Your next appointment:   1 year(s)  Provider:   Lynwood Schilling, MD    We recommend signing up for the patient portal called MyChart.  Sign up information is provided on this After Visit Summary.  MyChart is used to connect with patients for Virtual Visits (Telemedicine).  Patients are able to view lab/test results, encounter notes, upcoming appointments, etc.  Non-urgent messages can be sent to your provider as well.   To learn more about what you can do with MyChart, go to [email or web address].   Other Instructions Blood pressure diary: take your blood pressure 3 times daily for 2 weeks and send us  the readings on MyChart.

## 2023-12-26 ENCOUNTER — Telehealth: Payer: Self-pay | Admitting: Cardiology

## 2023-12-26 NOTE — Telephone Encounter (Signed)
 Patient dropped off a document for Dr. Denver review - I am placing this document in Hochrein's mailbox this morning.  Thank you.

## 2023-12-28 ENCOUNTER — Ambulatory Visit

## 2023-12-28 DIAGNOSIS — I4891 Unspecified atrial fibrillation: Secondary | ICD-10-CM | POA: Diagnosis not present

## 2023-12-29 NOTE — Telephone Encounter (Signed)
 Paperwork given to Dr. Lavona 11/13

## 2023-12-30 LAB — CUP PACEART REMOTE DEVICE CHECK
Battery Remaining Longevity: 114 mo
Battery Remaining Percentage: 100 %
Brady Statistic RV Percent Paced: 54 %
Date Time Interrogation Session: 20251112012300
Implantable Lead Connection Status: 753985
Implantable Lead Implant Date: 20240808
Implantable Lead Location: 753860
Implantable Lead Model: 7842
Implantable Lead Serial Number: 1314359
Implantable Pulse Generator Implant Date: 20240808
Lead Channel Impedance Value: 588 Ohm
Lead Channel Setting Pacing Amplitude: 2 V
Lead Channel Setting Pacing Pulse Width: 0.4 ms
Lead Channel Setting Sensing Sensitivity: 3.5 mV
Pulse Gen Serial Number: 907855
Zone Setting Status: 755011

## 2024-01-02 MED ORDER — AMLODIPINE BESYLATE 2.5 MG PO TABS: 2.5000 mg | ORAL_TABLET | Freq: Every day | ORAL | 3 refills | Status: AC

## 2024-01-02 NOTE — Addendum Note (Signed)
 Addended by: Bram Hottel N on: 01/02/2024 12:23 PM   Modules accepted: Orders

## 2024-01-02 NOTE — Telephone Encounter (Signed)
 Received suggestions from Dr. Lavona. Pt notified of his suggestion to start Norvasc  2.5 mg. Pt agreeable to plan. Prescription sent. Blood pressure readings will be uploaded to chart. Pt verbalized understanding. All questions if any were answered.

## 2024-01-03 NOTE — Progress Notes (Signed)
 Remote PPM Transmission

## 2024-01-09 ENCOUNTER — Ambulatory Visit: Payer: Self-pay | Admitting: Cardiovascular Disease

## 2024-02-01 ENCOUNTER — Ambulatory Visit: Payer: Self-pay | Admitting: Family Medicine

## 2024-02-01 ENCOUNTER — Encounter: Payer: Self-pay | Admitting: Family Medicine

## 2024-02-01 VITALS — BP 142/62 | HR 65 | Temp 97.1°F | Ht 71.0 in | Wt 200.0 lb

## 2024-02-01 DIAGNOSIS — I482 Chronic atrial fibrillation, unspecified: Secondary | ICD-10-CM

## 2024-02-01 DIAGNOSIS — Z23 Encounter for immunization: Secondary | ICD-10-CM

## 2024-02-01 DIAGNOSIS — E785 Hyperlipidemia, unspecified: Secondary | ICD-10-CM

## 2024-02-01 DIAGNOSIS — Z7984 Long term (current) use of oral hypoglycemic drugs: Secondary | ICD-10-CM | POA: Diagnosis not present

## 2024-02-01 DIAGNOSIS — I10 Essential (primary) hypertension: Secondary | ICD-10-CM | POA: Diagnosis not present

## 2024-02-01 DIAGNOSIS — H6123 Impacted cerumen, bilateral: Secondary | ICD-10-CM | POA: Diagnosis not present

## 2024-02-01 DIAGNOSIS — E1140 Type 2 diabetes mellitus with diabetic neuropathy, unspecified: Secondary | ICD-10-CM

## 2024-02-01 LAB — BAYER DCA HB A1C WAIVED: HB A1C (BAYER DCA - WAIVED): 5.4 % (ref 4.8–5.6)

## 2024-02-01 NOTE — Patient Instructions (Signed)
 Use Debrox drops weekly in each ear to reduce the risk of wax accumulation

## 2024-02-01 NOTE — Progress Notes (Signed)
 Subjective:  Patient ID: [name], male    DOB: 04/20/1935  Age: 88 y.o. MRN: [id number]  CC: Diabetes and ears (Feel funny for one week. Feels like there is water  in it. No pain. )   HPI  Discussed the use of AI scribe software for clinical note transcription with the patient, who gave verbal consent to proceed.  History of Present Illness [name] is an 88 year old male who presents for a routine follow-up visit.  He experiences a persistent sensation of 'water  in his ears' without a known cause, which he finds bothersome.  For diabetes management, he regularly monitors his blood sugar levels, checking at 10 PM and upon waking. His readings are typically in the 90s to 120s range, with occasional readings of 180 mg/dL. He continues to take metformin , and his recent A1c was 5.4%.  He is on amlodipine  2.5 mg for hypertension, which was recently adjusted by his cardiologist. He also takes apixaban  as a blood thinner.  For prostate issues, he takes Rapaflo  and finasteride. He describes frequent urination with reduced output and often sits to urinate due to simultaneous bowel movements. He experiences frequent gas.  He takes rosuvastatin  daily for cholesterol management.  He reports a history of increased bowel movements, requiring multiple trips to the bathroom daily, and is under the care of a GI specialist for this issue.  He also mentions a history of sensitivity in his foot.          02/01/2024    8:31 AM 11/02/2023   10:28 AM 08/01/2023    9:52 AM  Depression screen PHQ 2/9  Decreased Interest 0 0 0  Down, Depressed, Hopeless 0 0 0  PHQ - 2 Score 0 0 0  Altered sleeping 0  0  Tired, decreased energy 0  0  Change in appetite 0  0  Feeling bad or failure about yourself  0  0  Trouble concentrating 0  0  Moving slowly or fidgety/restless 0  0  Suicidal thoughts 0  0  PHQ-9 Score 0  0   Difficult doing work/chores Not difficult at all  Not difficult at all      Data saved with a previous flowsheet row definition    History Zelig has a past medical history of Arthritis, Atrial fibrillation (HCC), BPH (benign prostatic hyperplasia), Cataracts, bilateral, Coronary artery disease excluded, Diabetes mellitus without complication (HCC), Diverticulosis, Endocarditis, valve unspecified, unspecified cause, GERD (gastroesophageal reflux disease), H/O mitral valve repair, Hearing loss, Heart murmur, Hiatal hernia (06/13/2002), High cholesterol, History of kidney stones, OSA on CPAP, Sleep apnea, Stroke (HCC), Unspecified essential hypertension, Weakness of both arms (07/19/2012), and Wears dentures.   He has a past surgical history that includes Vasectomy; Mitral valve repair (~ 2003); Cataract extraction w/PHACO (Right, 05/12/2015); Cataract extraction w/PHACO (Left, 06/12/2015); Total knee arthroplasty (Left, 02/20/2016); Injection knee (Right, 02/20/2016); Cardioversion (N/A, 04/14/2016); Laparoscopic cholecystectomy; Knee arthroscopy (Left); Joint replacement; Shoulder open rotator cuff repair (Left); Colonoscopy; Upper gi endoscopy; Multiple tooth extractions; Cardiac catheterization (02/14/2007); Shoulder arthroscopy with rotator cuff repair and open biceps tenodesis (Right, 01/26/2019); Eye surgery; Total knee arthroplasty (Right, 07/06/2019); Esophagogastroduodenoscopy (N/A, 05/21/2021); ERCP (N/A, 05/21/2021); maloney dilation (N/A, 05/21/2021); sphincterotomy (05/21/2021); removal of stones (05/21/2021); Esophageal dilation (N/A, 05/21/2021); ERCP (N/A, 05/21/2021); Colonoscopy with propofol  (N/A, 01/21/2022); Esophagogastroduodenoscopy (egd) with propofol  (N/A, 01/21/2022); Esophageal dilation (01/21/2022); biopsy (01/21/2022); and PACEMAKER IMPLANT (N/A, 09/23/2022).   His family history includes Congestive Heart Failure in his father; Diabetes Mellitus II  in his brother and sister; Heart disease in his father; Lung cancer in his brother; Stroke in his brother, brother, sister, and  sister.He reports that he quit smoking about 35 years ago. His smoking use included cigarettes. He started smoking about 71 years ago. He has a 105 pack-year smoking history. He has been exposed to tobacco smoke. He has never used smokeless tobacco. He reports that he does not drink alcohol and does not use drugs.    ROS Review of Systems  Constitutional: Negative.   HENT:  Positive for hearing loss.   Eyes:  Negative for visual disturbance.  Respiratory:  Negative for cough and shortness of breath.   Cardiovascular:  Negative for chest pain and leg swelling.  Gastrointestinal:  Negative for abdominal pain, diarrhea, nausea and vomiting.  Genitourinary:  Negative for difficulty urinating.  Musculoskeletal:  Negative for arthralgias and myalgias.  Skin:  Negative for rash.  Neurological:  Negative for headaches.  Psychiatric/Behavioral:  Negative for sleep disturbance.     Objective:  BP (!) 142/62   Pulse 65   Temp (!) 97.1 F (36.2 C)   Ht 5' 11 (1.803 m)   Wt 200 lb (90.7 kg)   SpO2 100%   BMI 27.89 kg/m   BP Readings from Last 3 Encounters:  02/01/24 (!) 142/62  12/02/23 (!) 167/72  11/18/23 (!) 153/81    Wt Readings from Last 3 Encounters:  02/01/24 200 lb (90.7 kg)  12/02/23 201 lb 9.6 oz (91.4 kg)  11/18/23 197 lb (89.4 kg)     Physical Exam HENT:     Right Ear: There is impacted cerumen.     Left Ear: There is impacted cerumen.     Ears:     Comments: Large cast - like chunks of cerumen were removed by cerumen spoon after lavage, with instant improvement of hearing and resolution of symptoms   Physical Exam GENERAL: Alert, cooperative, well developed, no acute distress. HEENT: Normocephalic, normal oropharynx, moist mucous membranes, cerumen impaction in right ear. CHEST: Clear to auscultation bilaterally, no wheezes, rhonchi, or crackles. CARDIOVASCULAR: Normal heart rate and rhythm, S1 and S2 normal without murmurs, peripheral pulses intact. ABDOMEN:  Soft, non-tender, non-distended, without organomegaly, normal bowel sounds. EXTREMITIES: No cyanosis, edema, or ankle swelling. NEUROLOGICAL: Cranial nerves grossly intact, moves all extremities without gross motor or sensory deficit, decreased sensation at the tips of toes.   Assessment & Plan:  Type 2 diabetes mellitus with diabetic neuropathy, unspecified whether long term insulin  use (HCC) -     Bayer DCA Hb A1c Waived  Immunization due -     Tdap vaccine greater than or equal to 7yo IM  Chronic atrial fibrillation (HCC)  Dyslipidemia  Essential hypertension  Bilateral impacted cerumen    Assessment and Plan Assessment & Plan Type 2 diabetes mellitus with diabetic neuropathy   Blood glucose levels are generally well-controlled, with occasional readings around 180 mg/dL. A1c is at 5.4%. He reports some numbness in the toes, suggesting possible diabetic neuropathy. Continue metformin  and monitor blood glucose levels regularly, especially two hours postprandial. Educated on the importance of foot care to prevent complications from neuropathy.  Benign prostatic hyperplasia with lower urinary tract symptoms   He reports frequent urination and difficulty distinguishing between gas and urination. He is currently on Rapaflo  and finasteride. Continue both medications for BPH management. Consider over-the-counter gas medications if gas is frequent, taking a dose before each meal and at bedtime if needed.  Essential hypertension   Blood  pressure is managed with amlodipine  at a low dose of 2.5 mg. Continue amlodipine  for blood pressure management.  Dyslipidemia   Cholesterol levels are managed with rosuvastatin . Continue rosuvastatin  for cholesterol management.  General health maintenance   He was due for a tetanus shot and reports some wax buildup in the ears. Administered tetanus shot and had nurse wash out ears to remove wax buildup.       Follow-up: Return in about 3 months  (around 05/01/2024) for diabetes.  Butler Der, M.D.

## 2024-02-06 ENCOUNTER — Emergency Department (HOSPITAL_COMMUNITY)

## 2024-02-06 ENCOUNTER — Ambulatory Visit: Payer: Self-pay

## 2024-02-06 ENCOUNTER — Emergency Department (HOSPITAL_COMMUNITY)
Admission: EM | Admit: 2024-02-06 | Discharge: 2024-02-06 | Disposition: A | Discharge status: Home / Self Care | Attending: Emergency Medicine | Admitting: Emergency Medicine

## 2024-02-06 ENCOUNTER — Encounter (HOSPITAL_COMMUNITY): Payer: Self-pay

## 2024-02-06 ENCOUNTER — Other Ambulatory Visit: Payer: Self-pay

## 2024-02-06 DIAGNOSIS — M62838 Other muscle spasm: Secondary | ICD-10-CM

## 2024-02-06 DIAGNOSIS — M62830 Muscle spasm of back: Secondary | ICD-10-CM | POA: Insufficient documentation

## 2024-02-06 DIAGNOSIS — I10 Essential (primary) hypertension: Secondary | ICD-10-CM | POA: Insufficient documentation

## 2024-02-06 DIAGNOSIS — Z8673 Personal history of transient ischemic attack (TIA), and cerebral infarction without residual deficits: Secondary | ICD-10-CM | POA: Diagnosis not present

## 2024-02-06 DIAGNOSIS — E119 Type 2 diabetes mellitus without complications: Secondary | ICD-10-CM | POA: Insufficient documentation

## 2024-02-06 DIAGNOSIS — I4891 Unspecified atrial fibrillation: Secondary | ICD-10-CM | POA: Insufficient documentation

## 2024-02-06 DIAGNOSIS — Z7901 Long term (current) use of anticoagulants: Secondary | ICD-10-CM | POA: Diagnosis not present

## 2024-02-06 DIAGNOSIS — M542 Cervicalgia: Secondary | ICD-10-CM | POA: Diagnosis present

## 2024-02-06 DIAGNOSIS — I2510 Atherosclerotic heart disease of native coronary artery without angina pectoris: Secondary | ICD-10-CM | POA: Diagnosis not present

## 2024-02-06 LAB — BASIC METABOLIC PANEL WITH GFR
Anion gap: 14 (ref 5–15)
BUN: 14 mg/dL (ref 8–23)
CO2: 27 mmol/L (ref 22–32)
Calcium: 9.3 mg/dL (ref 8.9–10.3)
Chloride: 102 mmol/L (ref 98–111)
Creatinine, Ser: 0.74 mg/dL (ref 0.61–1.24)
GFR, Estimated: >60 mL/min
Glucose, Bld: 91 mg/dL (ref 70–99)
Potassium: 4.4 mmol/L (ref 3.5–5.1)
Sodium: 142 mmol/L (ref 135–145)

## 2024-02-06 LAB — CBC WITH DIFFERENTIAL/PLATELET
Abs Immature Granulocytes: 0.02 K/uL (ref 0.00–0.07)
Basophils Absolute: 0 K/uL (ref 0.0–0.1)
Basophils Relative: 1 %
Eosinophils Absolute: 0.1 K/uL (ref 0.0–0.5)
Eosinophils Relative: 2 %
HCT: 42.4 % (ref 39.0–52.0)
Hemoglobin: 14 g/dL (ref 13.0–17.0)
Immature Granulocytes: 0 %
Lymphocytes Relative: 15 %
Lymphs Abs: 1 K/uL (ref 0.7–4.0)
MCH: 31.5 pg (ref 26.0–34.0)
MCHC: 33 g/dL (ref 30.0–36.0)
MCV: 95.3 fL (ref 80.0–100.0)
Monocytes Absolute: 0.6 K/uL (ref 0.1–1.0)
Monocytes Relative: 9 %
Neutro Abs: 4.8 K/uL (ref 1.7–7.7)
Neutrophils Relative %: 73 %
Platelets: 255 K/uL (ref 150–400)
RBC: 4.45 MIL/uL (ref 4.22–5.81)
RDW: 12.9 % (ref 11.5–15.5)
WBC: 6.5 K/uL (ref 4.0–10.5)
nRBC: 0 % (ref 0.0–0.2)

## 2024-02-06 MED ORDER — DIAZEPAM 5 MG/ML IJ SOLN
5.0000 mg | Freq: Once | INTRAMUSCULAR | Status: AC
  Administered 2024-02-06: 5 mg via INTRAMUSCULAR
  Filled 2024-02-06: qty 2

## 2024-02-06 MED ORDER — OXYCODONE-ACETAMINOPHEN 5-325 MG PO TABS
1.0000 | ORAL_TABLET | Freq: Once | ORAL | Status: AC
  Administered 2024-02-06: 1 via ORAL
  Filled 2024-02-06: qty 1

## 2024-02-06 MED ORDER — METHOCARBAMOL 500 MG PO TABS: 500.0000 mg | ORAL_TABLET | Freq: Two times a day (BID) | ORAL | 0 refills | Status: AC

## 2024-02-06 NOTE — Telephone Encounter (Signed)
 Attempt # 1 to reach patient to triage symptoms. Left VM to call back    Summary: [phone] Ms. [name]   Pt is calling back stating that she was expecting a call in an hour and still has not heard anything, and is wanting to know if she will get her spouse to see a doctor today  ----- Message from Coral B sent at 02/06/2024  8:05 AM EST ----- Reason for Triage: Pt spouse states the pt is having neck pain that moves from one side to the other similar to having a crook in the neck.

## 2024-02-06 NOTE — Discharge Instructions (Addendum)
 We evaluated you for your neck pain.  Your testing including your laboratory testing and your CT scan are reassuring.  We did not see any dangerous cause of your symptoms.  We suspect your symptoms are due to a muscle spasm.  your symptoms improved with muscle relaxants in the emergency department.  Please take at 1000 mg of Tylenol  every 6 hours as needed for pain.  We have also prescribed you a muscle relaxer.  Be careful using this medicine as it can make you drowsy and at risk of falling.  Please follow-up closely with your primary physician.  If you notice any new symptoms such as fevers or chills, headaches, numbness or tingling, vision changes, weakness in your arms or legs, trouble swallowing, chest pain, shortness of breath, or any other new symptoms, please return to the emergency department.

## 2024-02-06 NOTE — ED Provider Notes (Signed)
 " [place] EMERGENCY DEPARTMENT AT [facility] Provider Note  CSN: [id number] Arrival date & time: 02/06/24 1209  Chief Complaint(s) Torticollis  HPI [name] is a 88 y.o. male history of atrial fibrillation on Eliquis , diabetes, hypertension presenting with neck pain.  Patient reports pain to the left lateral neck, some on the right neck also.  Reports the pain is worse with moving the neck, not present at rest.  Present with lateral motion and up-and-down motion.  No fevers or chills.  No numbness or tingling, weakness, no midline pain.  No headaches.  No vision changes.  No dizziness.  Tried Tylenol  without improvement.  Went to urgent care but were closed so came to ER.   Past Medical History Past Medical History:  Diagnosis Date   Arthritis    knees, shoulder,  hips (10/24/2017)   Atrial fibrillation (HCC)    on eliquis    BPH (benign prostatic hyperplasia)    Cataracts, bilateral    removed by surgery   Coronary artery disease excluded    Diabetes mellitus without complication (HCC)    type 2   Diverticulosis    Endocarditis, valve unspecified, unspecified cause    GERD (gastroesophageal reflux disease)    H/O mitral valve repair    Postoperative ring with postoperative atrial fibrillation   Hearing loss    both ears - does not use hearing aids   Heart murmur    hx   Hiatal hernia 06/13/2002   High cholesterol    History of kidney stones    passed spontaneously x2    OSA on CPAP    uses cpap nightly   Sleep apnea    Stroke [facility])    Unspecified essential hypertension    Weakness of both arms 07/19/2012   Wears dentures    upper and lower partial   Patient Active Problem List   Diagnosis Date Noted   Syncope 08/03/2022   Non-rheumatic mitral regurgitation 11/25/2021   Dysphagia 08/17/2021   GERD (gastroesophageal reflux disease) 08/17/2021   Chronic diarrhea 08/17/2021   Epigastric pain    Elevated liver enzymes 05/19/2021   Low back pain  10/13/2020   Spondylosis without myelopathy or radiculopathy, cervical region 02/28/2020   S/P total knee arthroplasty, right 07/25/2019   Rotator cuff impingement syndrome of right shoulder 01/26/2019   Complete tear of right rotator cuff 01/19/2019   Biceps tendinopathy of right upper extremity 01/19/2019   Pain in right shoulder 12/06/2018   TIA (transient ischemic attack) 12/05/2017   Type 2 diabetes mellitus with diabetic neuropathy, unspecified (HCC) 10/25/2017   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   Diplopia 10/24/2017   S/P total knee arthroplasty, left 04/29/2016   Weakness of both arms 07/19/2012   Colon cancer screening 06/22/2012   Venous (peripheral) insufficiency 05/05/2012   Chronic venous insufficiency 04/07/2012   Other malaise and fatigue 03/16/2012   Disturbance of skin sensation 03/16/2012   Carotid stenosis 08/16/2011   Obesity 08/16/2011   H/O mitral valve repair    Dyslipidemia 09/23/2010   Essential hypertension 04/06/2007   ATRIAL FIBRILLATION 04/06/2007   Sleep apnea 04/06/2007   Home Medication(s) Prior to Admission medications  Medication Sig Start Date End Date Taking? Authorizing Provider  methocarbamol  (ROBAXIN ) 500 MG tablet Take 1 tablet (500 mg total) by mouth 2 (two) times daily. 02/06/24  Yes Francesca Elsie CROME, MD  ACCU-CHEK GUIDE TEST test strip CHECK SUGAR IN THE MORNING, AT NOON, AND AT BEDTIME 08/22/23   Zollie Lowers,  MD  Accu-Chek Softclix Lancets lancets Test BS in the morning, at noon and at bedtime Dx E11.40 11/01/22   Zollie Lowers, MD  amLODipine  (NORVASC ) 2.5 MG tablet Take 1 tablet (2.5 mg total) by mouth daily. 01/02/24   Lynwood Schilling, MD  apixaban  (ELIQUIS ) 5 MG TABS tablet TAKE 1 TABLET BY MOUTH TWICE A DAY 10/18/23   Croitoru, Mihai, MD  Bacillus Coagulans-Inulin (ALIGN PREBIOTIC-PROBIOTIC PO) Take 1 capsule by mouth daily.    [provider]  Blood Glucose Monitoring Suppl DEVI 1 each by Does not apply route in the  morning, at noon, and at bedtime. May substitute to any manufacturer covered by patient's insurance. 07/22/22   Zollie Lowers, MD  Calcium  Carb-Cholecalciferol (CALCIUM  600 + D PO) Take 1 tablet by mouth daily.    [provider]  clobetasol  cream (TEMOVATE ) 0.05 % Apply 1 Application topically 2 (two) times daily as needed (Psoriasis). 04/27/23   Zollie Lowers, MD  Coenzyme Q10 300 MG CAPS Take 300 mg by mouth daily.    [provider]  colestipol  (COLESTID ) 1 g tablet TAKE 4 TABLETS (4 G TOTAL) BY MOUTH DAILY. TAKE IT 4 HOURS APART FROM HIS OTHER MEDICATIONS 08/08/23   Mariette Cree L, NP  finasteride (PROSCAR) 5 MG tablet Take 5 mg by mouth daily. 06/03/22   [provider]  fluticasone (CUTIVATE) 0.05 % cream Apply 1 Application topically 2 (two) times daily as needed (Psoriasis). 10/22/21   [provider]  folic acid (FOLVITE) 1 MG tablet Take 1 mg by mouth daily. 12/10/21   [provider]  metFORMIN  (GLUCOPHAGE -XR) 500 MG 24 hr tablet TAKE 1 TABLET BY MOUTH EVERY DAY WITH BREAKFAST 04/27/23   Zollie Lowers, MD  Multiple Vitamin (MULTIVITAMIN WITH MINERALS) TABS tablet Take 1 tablet by mouth daily.    [provider]  omeprazole  (PRILOSEC) 40 MG capsule TAKE 1 CAPSULE (40 MG TOTAL) BY MOUTH DAILY. 07/05/23   Eartha Angelia Sieving, MD  rosuvastatin  (CRESTOR ) 5 MG tablet Take 1 tablet (5 mg total) by mouth daily. For cholesterol 04/27/23   Zollie Lowers, MD  silodosin  (RAPAFLO ) 8 MG CAPS capsule Take 8 mg by mouth daily with breakfast.    [provider]  triamcinolone cream (KENALOG) 0.1 % Apply 1 Application topically daily as needed (Psoriasis). 12/30/21   [provider]                                                                                                                                    Past Surgical History Past Surgical History:  Procedure Laterality Date   BIOPSY  01/21/2022   Procedure: BIOPSY;   Surgeon: Eartha Angelia Sieving, MD;  Location: [facility] ENDO SUITE;  Service: Gastroenterology;;   CARDIAC CATHETERIZATION  02/14/2007   x 2   CARDIOVERSION N/A 04/14/2016   Procedure: CARDIOVERSION;  Surgeon: Lynwood Schilling, MD;  Location: [facility] ENDOSCOPY;  Service: Cardiovascular;  Laterality:  N/A;   CATARACT EXTRACTION W/PHACO Right 05/12/2015   Procedure: CATARACT EXTRACTION PHACO AND INTRAOCULAR LENS PLACEMENT RIGHT EYE CDE=7.75;  Surgeon: Cherene Mania, MD;  Location: [facility] ORS;  Service: Ophthalmology;  Laterality: Right;   CATARACT EXTRACTION W/PHACO Left 06/12/2015   Procedure: CATARACT EXTRACTION PHACO AND INTRAOCULAR LENS PLACEMENT (IOC);  Surgeon: Cherene Mania, MD;  Location: [facility] ORS;  Service: Ophthalmology;  Laterality: Left;  CDE:  13.17   COLONOSCOPY     COLONOSCOPY WITH PROPOFOL  N/A 01/21/2022   Procedure: COLONOSCOPY WITH PROPOFOL ;  Surgeon: Eartha Angelia Sieving, MD;  Location: [facility] ENDO [facility]ITE;  Service: Gastroenterology;  Laterality: N/A;  115 ASA 3   ERCP N/A 05/21/2021   Procedure: ENDOSCOPIC RETROGRADE CHOLANGIOPANCREATOGRAPHY (ERCP);  Surgeon: Golda Claudis PENNER, MD;  Location: [facility] ORS;  Service: Gastroenterology;  Laterality: N/A;   ERCP N/A 05/21/2021   Procedure: ENDOSCOPIC RETROGRADE CHOLANGIOPANCREATOGRAPHY (ERCP);  Surgeon: Golda Claudis PENNER, MD;  Location: [facility] ORS;  Service: Gastroenterology;  Laterality: N/A;   ESOPHAGEAL DILATION N/A 05/21/2021   Procedure: ESOPHAGEAL DILATION;  Surgeon: Golda Claudis PENNER, MD;  Location: [facility] ORS;  Service: Gastroenterology;  Laterality: N/A;   ESOPHAGEAL DILATION  01/21/2022   Procedure: ESOPHAGEAL DILATION;  Surgeon: Eartha Angelia Sieving, MD;  Location: [facility];  Service: Gastroenterology;;   ESOPHAGOGASTRODUODENOSCOPY N/A 05/21/2021   Procedure: ESOPHAGOGASTRODUODENOSCOPY (EGD);  Surgeon: Golda Claudis PENNER, MD;  Location: [facility] ORS;  Service: Gastroenterology;  Laterality: N/A;   ESOPHAGOGASTRODUODENOSCOPY (EGD) WITH PROPOFOL  N/A 01/21/2022    Procedure: ESOPHAGOGASTRODUODENOSCOPY (EGD) WITH PROPOFOL ;  Surgeon: Eartha Angelia Sieving, MD;  Location: [facility];  Service: Gastroenterology;  Laterality: N/A;   EYE SURGERY     Bilateral cataracts   INJECTION KNEE Right 02/20/2016   Procedure: KNEE INJECTION;  Surgeon: Oneil JAYSON Herald, MD;  Location[facility] OR;  Service: Orthopedics;  Laterality: Right;   JOINT REPLACEMENT     KNEE ARTHROSCOPY Left    LAPAROSCOPIC CHOLECYSTECTOMY     MALONEY DILATION N/A 05/21/2021   Procedure: MALONEY DILATION;  Surgeon: Golda Claudis PENNER, MD;  Location: [facility] ORS;  Service: Gastroenterology;  Laterality: N/A;   MITRAL VALVE REPAIR  ~ 2003   MULTIPLE TOOTH EXTRACTIONS     PACEMAKER IMPLANT N/A 09/23/2022   Procedure: PACEMAKER IMPLANT;  Surgeon: Francyne Headland, MD;  Location: [facility] INVASIVE CV LAB;  Service: Cardiovascular;  Laterality: N/A;   REMOVAL OF STONES  05/21/2021   Procedure: REMOVAL OF STONES;  Surgeon: Golda Claudis PENNER, MD;  Location: [facility] ORS;  Service: Gastroenterology;;   SHOULDER ARTHROSCOPY WITH ROTATOR CUFF REPAIR AND OPEN BICEPS TENODESIS Right 01/26/2019   Procedure: right shoulder arthroscopy, rotator cuff repair and biceps tenodesis;  Surgeon: Herald Oneil JAYSON, MD;  Location: [facility] OR;  Service: Orthopedics;  Laterality: Right;   SHOULDER OPEN ROTATOR CUFF REPAIR Left    SPHINCTEROTOMY  05/21/2021   Procedure: SPHINCTEROTOMY;  Surgeon: Golda Claudis PENNER, MD;  Location: [facility];  Service: Gastroenterology;;   TOTAL KNEE ARTHROPLASTY Left 02/20/2016   Procedure: LEFT TOTAL KNEE ARTHROPLASTY WITH RIGHT KNEE INJECTION;  Surgeon: Oneil JAYSON Herald, MD;  Location: [facility] OR;  Service: Orthopedics;  Laterality: Left;   TOTAL KNEE ARTHROPLASTY Right 07/06/2019   Procedure: RIGHT TOTAL KNEE ARTHROPLASTY;  Surgeon: Herald Oneil JAYSON, MD;  Location: [facility] OR;  Service: Orthopedics;  Laterality: Right;   UPPER GI ENDOSCOPY     VASECTOMY     Family History Family History  Problem Relation Age of Onset   Congestive Heart Failure  Father  Heart disease Father    Stroke Brother    Stroke Sister    Diabetes Mellitus II Brother    Diabetes Mellitus II Sister    Lung cancer Brother    Stroke Sister    Stroke Brother    Colon cancer Neg Hx    Esophageal cancer Neg Hx    Rectal cancer Neg Hx    Stomach cancer Neg Hx     Social History Social History[1] Allergies Flomax [tamsulosin hcl]  Review of Systems Review of Systems  All other systems reviewed and are negative.   Physical Exam Vital Signs  I have reviewed the triage vital signs BP (!) 158/107 (BP Location: Right Arm)   Pulse 64   Temp 97.7 F (36.5 C) (Oral)   Resp 16   Ht 5' 11 (1.803 m)   Wt 90.7 kg   SpO2 100%   BMI 27.89 kg/m  Physical Exam Vitals and nursing note reviewed.  Constitutional:      General: He is not in acute distress.    Appearance: Normal appearance.  HENT:     Mouth/Throat:     Mouth: Mucous membranes are moist.  Eyes:     Conjunctiva/sclera: Conjunctivae normal.  Neck:     Comments: Limited range of motion with lateral rotation, flexion and extension due to pain Cardiovascular:     Rate and Rhythm: Normal rate and regular rhythm.  Pulmonary:     Effort: Pulmonary effort is normal. No respiratory distress.     Breath sounds: Normal breath sounds.  Abdominal:     General: Abdomen is flat.     Palpations: Abdomen is soft.     Tenderness: There is no abdominal tenderness.  Musculoskeletal:     Cervical back: Tenderness (left paraspinal trapezius) present. No rigidity.     Right lower leg: No edema.     Left lower leg: No edema.  Skin:    General: Skin is warm and dry.     Capillary Refill: Capillary refill takes less than 2 seconds.  Neurological:     Mental Status: He is alert and oriented to person, place, and time. Mental status is at baseline.     Comments: Cranial nerves II through XII intact, strength 5 out of 5 in the bilateral upper and lower extremities, no sensory deficit to light touch, no  dysmetria on finger-nose-finger testing, ambulatory with steady gait.  Psychiatric:        Mood and Affect: Mood normal.        Behavior: Behavior normal.     ED Results and Treatments Labs (all labs ordered are listed, but only abnormal results are displayed) Labs Reviewed  BASIC METABOLIC PANEL WITH GFR  CBC WITH DIFFERENTIAL/PLATELET                                                                                                                          Radiology CT Cervical Spine Wo Contrast Result Date: 02/06/2024 EXAM: CT  CERVICAL SPINE WITHOUT CONTRAST 02/06/2024 02:32:43 PM TECHNIQUE: CT of the cervical spine was performed without the administration of intravenous contrast. Multiplanar reformatted images are provided for review. Automated exposure control, iterative reconstruction, and/or weight based adjustment of the mA/kV was utilized to reduce the radiation dose to as low as reasonably achievable. COMPARISON: None available. CLINICAL HISTORY: Neck pain, acute, no red flags. FINDINGS: BONES AND ALIGNMENT: No acute fracture or traumatic malalignment. DEGENERATIVE CHANGES: Mild cervical degenerative disc disease without high grade spinal canal stenosis. SOFT TISSUES: No prevertebral soft tissue swelling. VASCULATURE: Atherosclerotic calcifications of the carotid bulbs. SINUSES: Chronic right sphenoid sinusitis. IMPRESSION: 1. No acute fracture or traumatic malalignment of the cervical spine. Electronically signed by: Ryan Chess MD 02/06/2024 02:48 PM EST RP Workstation: [id number]    Pertinent labs & imaging results that were available during my care of the patient were reviewed by me and considered in my medical decision making (see MDM for details).  Medications Ordered in ED Medications  oxyCODONE -acetaminophen  (PERCOCET/ROXICET) 5-325 MG per tablet 1 tablet (1 tablet Oral Given 02/06/24 1447)  diazepam  (VALIUM ) injection 5 mg (5 mg Intramuscular Given 02/06/24 1447)                                                                                                                                      Procedures Procedures  (including critical care time)  Medical Decision Making / ED Course   MDM:  88 year old presenting with neck pain.  Pain seems most likely musculoskeletal given location of symptoms.  Given age, CT of the neck was obtained to evaluate for any occult process such as occult fracture, negative for this.  Labs are reassuring with no leukocytosis.  He is no fevers or chills, headache to suggest other process such as CNS infection.  He has no sore throat or trouble swallowing to suggest deep space neck infection.  No neurologic symptoms to suggest stroke, dissection, spinal cord process.  Will treat with pain control, muscle relaxer and reassess.  Clinical Course as of 02/06/24 1628  Mon Feb 06, 2024  1627 Patient is feeling better.  His range of motion has improved after receiving pain control with some Valium .  His imaging and labs are reassuring.  Seems most consistent with muscle spasm.  No red flag symptoms to suggest other process.  Patient is feeling comfortable with discharge to home.  Recommended close follow-up with his primary physician and return precautions were discussed with the patient. [WS]    Clinical Course User Index [WS] Francesca Elsie CROME, MD      Lab Tests: -I ordered, reviewed, and interpreted labs.   The pertinent results include:   Labs Reviewed  BASIC METABOLIC PANEL WITH GFR  CBC WITH DIFFERENTIAL/PLATELET    Notable for normal labs, no leukocytosis   EKG   EKG Interpretation Date/Time:    Ventricular Rate:    PR Interval:    QRS  Duration:    QT Interval:    QTC Calculation:   R Axis:      Text Interpretation:           Imaging Studies ordered: I ordered imaging studies including CT cervical spine On my interpretation imaging demonstrates no acute fracture or injury  I independently visualized  and interpreted imaging. I agree with the radiologist interpretation   Medicines ordered and prescription drug management: Meds ordered this encounter  Medications   oxyCODONE -acetaminophen  (PERCOCET/ROXICET) 5-325 MG per tablet 1 tablet    Refill:  0   diazepam  (VALIUM ) injection 5 mg   methocarbamol  (ROBAXIN ) 500 MG tablet    Sig: Take 1 tablet (500 mg total) by mouth 2 (two) times daily.    Dispense:  20 tablet    Refill:  0    -I have reviewed the patients home medicines and have made adjustments as needed   Reevaluation: After the interventions noted above, I reevaluated the patient and found that their symptoms have improved  Co morbidities that complicate the patient evaluation  Past Medical History:  Diagnosis Date   Arthritis    knees, shoulder,  hips (10/24/2017)   Atrial fibrillation (HCC)    on eliquis    BPH (benign prostatic hyperplasia)    Cataracts, bilateral    removed by surgery   Coronary artery disease excluded    Diabetes mellitus without complication (HCC)    type 2   Diverticulosis    Endocarditis, valve unspecified, unspecified cause    GERD (gastroesophageal reflux disease)    H/O mitral valve repair    Postoperative ring with postoperative atrial fibrillation   Hearing loss    both ears - does not use hearing aids   Heart murmur    hx   Hiatal hernia 06/13/2002   High cholesterol    History of kidney stones    passed spontaneously x2    OSA on CPAP    uses cpap nightly   Sleep apnea    Stroke [facility])    Unspecified essential hypertension    Weakness of both arms 07/19/2012   Wears dentures    upper and lower partial      Dispostion: Disposition decision including need for hospitalization was considered, and patient discharged from emergency department.    Final Clinical Impression(s) / ED Diagnoses Final diagnoses:  Cervical paraspinal muscle spasm     This chart was dictated using voice recognition software.  Despite best  efforts to proofread,  errors can occur which can change the documentation meaning.     [1]  Social History Tobacco Use   Smoking status: Former    Current packs/day: 0.00    Average packs/day: 3.0 packs/day for 35.0 years (105.0 ttl pk-yrs)    Types: Cigarettes    Start date: 02/15/1953    Quit date: 02/16/1988    Years since quitting: 35.9    Passive exposure: Past   Smokeless tobacco: Never   Tobacco comments:     Year Quit: 1990   Vaping Use   Vaping status: Never Used  Substance Use Topics   Alcohol use: No   Drug use: Never     Francesca Elsie CROME, MD 02/06/24 1629  "

## 2024-02-06 NOTE — ED Triage Notes (Signed)
 Pt arrived via POV c/o torticollis since last week Thursday. Pt reports trying heat and ice and OTC medications w/o relief. Pt reports calling PCP office and was advised to go to Urgent Care, however, Pt reports Urgent Care was closed so he came to [facility] for evaluation. Pt denies injury.

## 2024-02-06 NOTE — Telephone Encounter (Signed)
Fyi noted.

## 2024-02-06 NOTE — Telephone Encounter (Signed)
 FYI Only or Action Required?: FYI only for provider: pt going to urgent care.  Patient was last seen in primary care on 02/01/2024 by Zollie Lowers, MD.  Called Nurse Triage reporting Neck Pain.  Symptoms began several days ago.  Interventions attempted: OTC medications: Tylenol , heating pad, ice pack.  Symptoms are: gradually worsening.  Triage Disposition: See HCP Within 4 Hours (Or PCP Triage)  Patient/caregiver understands and will follow disposition?: Yes                     Copied from CRM #[id number]. Topic: Clinical - Pink Word Triage >> Feb 06, 2024  8:03 AM Farrel B wrote: Pink Word triggered transfer to Nurse Triage. See Triage Message for details. >> Feb 06, 2024  8:05 AM Farrel B wrote: Reason for Triage: Pt spouse states the pt is having neck pain that moves from one side to the other similar to having a crook in the neck. Reason for Disposition  [1] SEVERE neck pain (e.g., excruciating, unable to do any normal activities) AND [2] not improved after 2 hours of pain medicine  Answer Assessment - Initial Assessment Questions This RN spoke with pt's wife, Meade, and patient. This RN recommends pt goes to urgent care within 4 hours as no appointment availability in office. This RN recommends someone else drives him to urgent care.This RN educated pt on home care, new-worsening symptoms, when to call back/seek emergent care. Pt verbalized understanding and agrees to plan.   Neck pain started last Thurs Started on left side, went to right side on Sun, now back on left side; can't turn head today, severe pain, 10/10 pain level Denies having this before, I can't recall Pt was seen in office on 12/17 and had his ears cleaned out, I'm not sure if that had anything to do with it  Denies:  Difficulty breathing Headache Unusual sweating Fever Weakness of an arm or hand Problems w bowel or bladder control  Protocols used: Neck Pain or  Stiffness-A-AH

## 2024-02-06 NOTE — Telephone Encounter (Signed)
 Pt currently in ED per chart review. Routing to clinic as FYI.

## 2024-02-06 NOTE — Telephone Encounter (Signed)
 Going to urgent care

## 2024-03-27 ENCOUNTER — Ambulatory Visit

## 2024-03-28 ENCOUNTER — Encounter

## 2024-05-01 ENCOUNTER — Ambulatory Visit: Admitting: Family Medicine

## 2024-05-17 ENCOUNTER — Ambulatory Visit: Admitting: Cardiovascular Disease

## 2024-06-27 ENCOUNTER — Encounter

## 2024-09-26 ENCOUNTER — Encounter

## 2024-12-26 ENCOUNTER — Encounter
